# Patient Record
Sex: Female | Born: 1955 | Race: Black or African American | Hispanic: No | Marital: Single | Smoking: Never smoker
Health system: Southern US, Community
[De-identification: ages and names within clinical notes are randomized; demographics above are authoritative.]

## PROBLEM LIST (undated history)

## (undated) DIAGNOSIS — K219 Gastro-esophageal reflux disease without esophagitis: Secondary | ICD-10-CM

## (undated) DIAGNOSIS — I10 Essential (primary) hypertension: Secondary | ICD-10-CM

## (undated) DIAGNOSIS — C801 Malignant (primary) neoplasm, unspecified: Secondary | ICD-10-CM

## (undated) DIAGNOSIS — Z9221 Personal history of antineoplastic chemotherapy: Secondary | ICD-10-CM

## (undated) HISTORY — PX: ESOPHAGOGASTRODUODENOSCOPY ENDOSCOPY: SHX5814

## (undated) HISTORY — PX: COLONOSCOPY: SHX174

## (undated) HISTORY — DX: Gastro-esophageal reflux disease without esophagitis: K21.9

---

## 1997-10-17 ENCOUNTER — Ambulatory Visit (HOSPITAL_COMMUNITY): Admission: RE | Admit: 1997-10-17 | Discharge: 1997-10-17 | Payer: Self-pay | Admitting: Obstetrics and Gynecology

## 1999-11-26 ENCOUNTER — Other Ambulatory Visit: Admission: RE | Admit: 1999-11-26 | Discharge: 1999-11-26 | Payer: Self-pay | Admitting: *Deleted

## 2000-06-22 ENCOUNTER — Other Ambulatory Visit (HOSPITAL_COMMUNITY): Admission: RE | Admit: 2000-06-22 | Discharge: 2000-07-04 | Payer: Self-pay | Admitting: Psychiatry

## 2005-10-25 ENCOUNTER — Encounter: Admission: RE | Admit: 2005-10-25 | Discharge: 2005-10-25 | Payer: Self-pay | Admitting: Internal Medicine

## 2008-04-19 ENCOUNTER — Ambulatory Visit: Payer: Self-pay | Admitting: Gastroenterology

## 2008-05-03 ENCOUNTER — Ambulatory Visit: Payer: Self-pay | Admitting: Gastroenterology

## 2010-08-10 ENCOUNTER — Encounter: Payer: Self-pay | Admitting: Family Medicine

## 2010-08-18 NOTE — Procedures (Signed)
Summary: Colonoscopy   Colonoscopy  Procedure date:  05/03/2008  Findings:      Location:  [facility].    Procedures Next Due Date:    Colonoscopy: 05/2018  Patient Name: Teresa Hays, Teresa Hays MRN:  Procedure Procedures: Colonoscopy CPT: [address].  Personnel: Endoscopist: Rachael Fee, MD.  Referred By: Tracey Harries, MD.  Exam Location: Exam performed in Endoscopy Suite. Outpatient  Patient Consent: Procedure, Alternatives, Risks and Benefits discussed, consent obtained, from patient. Consent was obtained by the RN.  Indications  Average Risk Screening Routine.  History  Current Medications: Patient is not currently taking Coumadin.  Comments: Patient history reviewed and updated, pre-procedure physical performed prior to initiation of sedation? yes Pre-Exam Physical: Performed May 03, 2008. Cardio-pulmonary exam, Abdominal exam, Mental status exam WNL.  Exam Exam: Extent of exam reached: Cecum, extent intended: Cecum.  The cecum was identified by appendiceal orifice and IC valve. Patient position: on left side. Time to Cecum: 00:03:40. Time for Withdrawl: 00:05:52. Colon retroflexion performed. Images taken. ASA Classification: II. Tolerance: good.  Monitoring: Pulse and BP monitoring, Oximetry used. Supplemental O2 given.  Colon Prep Prep results: good.  Sedation Meds: Patient assessed and found to be appropriate for moderate (conscious) sedation. Fentanyl 50 mcg. given IV. Versed 7 mg. given IV.  Findings - NORMAL EXAM: Cecum to Rectum.   Assessment Normal examination.  Comments: NORMAL EXAMINATION.  NO POLYPS, NO CANCERS.  SHE SHOULD CONTINUE TO FOLLOW CURRENT COLORECTAL CANCER SCREENING GUIDELINES WITH A REPEAT COLONOSCOPY IN 10 YEARS. Events  Unplanned Interventions: No intervention was required.  Unplanned Events: There were no complications. Plans Comments: COLONOSCOPY IN 10 YEARS   cc: Tracey Harries, MD  This report was  created from the original endoscopy report, which was reviewed and signed by the above listed endoscopist.

## 2010-08-18 NOTE — Miscellaneous (Signed)
Summary: GI PV  Clinical Lists Changes  Medications: Added new medication of MOVIPREP 100 GM  SOLR (PEG-KCL-NACL-NASULF-NA ASC-C) per prep instructions - Signed Rx of MOVIPREP 100 GM  SOLR (PEG-KCL-NACL-NASULF-NA ASC-C) per prep instructions;  #1 x 0;  Signed;  Entered by: Ezra Sites RN;  Authorized by: Rachael Fee MD;  Method used: Electronically to [facility] [address], [place], [place], [place]  [address], Ph: [phone], Fax: [phone] Observations: Added new observation of NKA: T (04/19/2008 16:20)    Prescriptions: MOVIPREP 100 GM  SOLR (PEG-KCL-NACL-NASULF-NA ASC-C) per prep instructions  #1 x 0   Entered by:   Ezra Sites RN   Authorized by:   Rachael Fee MD   Signed by:   Ezra Sites RN on 04/19/2008   Method used:   Electronically to        [facility] (retail)       [address]       [place], [place]  [address]       Ph: [phone]       Fax: [phone]   RxID:   [id number]

## 2012-11-07 ENCOUNTER — Encounter (HOSPITAL_COMMUNITY): Payer: Self-pay | Admitting: *Deleted

## 2012-11-07 ENCOUNTER — Emergency Department (INDEPENDENT_AMBULATORY_CARE_PROVIDER_SITE_OTHER)
Admission: EM | Admit: 2012-11-07 | Discharge: 2012-11-07 | Disposition: A | Discharge status: Home / Self Care | Payer: Self-pay | Source: Home / Self Care | Attending: Family Medicine | Admitting: Family Medicine

## 2012-11-07 DIAGNOSIS — IMO0002 Reserved for concepts with insufficient information to code with codable children: Secondary | ICD-10-CM

## 2012-11-07 DIAGNOSIS — S73101A Unspecified sprain of right hip, initial encounter: Secondary | ICD-10-CM

## 2012-11-07 HISTORY — DX: Essential (primary) hypertension: I10

## 2012-11-07 MED ORDER — IBUPROFEN 600 MG PO TABS: 600.0000 mg | ORAL_TABLET | Freq: Three times a day (TID) | ORAL | Status: DC | PRN

## 2012-11-07 MED ORDER — CYCLOBENZAPRINE HCL 10 MG PO TABS: 10.0000 mg | ORAL_TABLET | Freq: Two times a day (BID) | ORAL | Status: DC | PRN

## 2012-11-07 MED ORDER — HYDROCODONE-ACETAMINOPHEN 5-325 MG PO TABS: 2.0000 | ORAL_TABLET | Freq: Three times a day (TID) | ORAL | Status: DC | PRN

## 2012-11-07 MED ORDER — TRAMADOL HCL 50 MG PO TABS: 50.0000 mg | ORAL_TABLET | Freq: Three times a day (TID) | ORAL | Status: DC | PRN

## 2012-11-07 NOTE — ED Notes (Signed)
Pt  Ambulated  To  Room  slowley  With  Pain on  Weight  Bearing     She  Reports    2  Days  Ago  She  Twisted   wriong  And  Felt  Something  Pop in r  Hip      She  denys  Falling       She reports  Pain not  releived  By  Motrin

## 2012-11-07 NOTE — ED Provider Notes (Signed)
History     CSN: [id number]  Arrival date & time 11/07/12  1429   First MD Initiated Contact with Patient 11/07/12 1513      Chief Complaint  Patient presents with  . Hip Pain    (Consider location/radiation/quality/duration/timing/severity/associated sxs/prior treatment) HPI Comments: 57 year old female here complaining of right-sided outer hip pain that started 2 days ago after moving a heavy pot from a lower position up to the counter by rotating her hip. Patient reports that she felt pain right after this maneuver. She has taken ibuprofen and heat pads with some improvement. Pain is worse with external rotation of her right hip and with hip flexion. No significant discomfort when standing still on the right lower extremity. Denies fever or chills. No general malaise. No recent illness. No direct injury or recent falls.   Past Medical History  Diagnosis Date  . Hypertension     Past Surgical History  Procedure Laterality Date  . Colonoscopy      No family history on file.  History  Substance Use Topics  . Smoking status: Never Smoker   . Smokeless tobacco: Not on file  . Alcohol Use: No    OB History   Grav Para Term Preterm Abortions TAB SAB Ect Mult Living                  Review of Systems  Constitutional: Negative for fever, chills, appetite change and fatigue.  HENT: Negative for congestion and sore throat.   Gastrointestinal: Negative for nausea, vomiting, abdominal pain and diarrhea.  Genitourinary: Negative for dysuria, frequency, hematuria, flank pain, vaginal discharge, genital sores and pelvic pain.  Musculoskeletal:       As per HPI  Skin: Negative for rash.  Neurological: Negative for headaches.    Allergies  Review of patient's allergies indicates no known allergies.  Home Medications   Current Outpatient Rx  Name  Route  Sig  Dispense  Refill  . atenolol (TENORMIN) 25 MG tablet   Oral   Take 25 mg by mouth daily.         .  hydrochlorothiazide (HYDRODIURIL) 25 MG tablet   Oral   Take 25 mg by mouth daily.         . cyclobenzaprine (FLEXERIL) 10 MG tablet   Oral   Take 1 tablet (10 mg total) by mouth 2 (two) times daily as needed for muscle spasms.   20 tablet   0   . HYDROcodone-acetaminophen (NORCO/VICODIN) 5-325 MG per tablet   Oral   Take 2 tablets by mouth every 8 (eight) hours as needed for pain.   6 tablet   0   . ibuprofen (ADVIL,MOTRIN) 600 MG tablet   Oral   Take 1 tablet (600 mg total) by mouth every 8 (eight) hours as needed for pain.   30 tablet   0   . traMADol (ULTRAM) 50 MG tablet   Oral   Take 1 tablet (50 mg total) by mouth every 8 (eight) hours as needed for pain.   21 tablet   0     BP 167/93  Pulse 60  Temp(Src) 97.9 F (36.6 C) (Oral)  Resp 18  SpO2 99%  Physical Exam  Nursing note and vitals reviewed. Constitutional: She is oriented to person, place, and time. She appears well-developed and well-nourished. No distress.  HENT:  Head: Normocephalic and atraumatic.  Mouth/Throat: No oropharyngeal exudate.  Cardiovascular: Normal heart sounds.   Pulmonary/Chest: Breath sounds normal.  Abdominal:  Soft. There is no tenderness.  Musculoskeletal:  Right Hip: no obvious deformity, no erythema or swelling. Patient is weight bearing with no pain is standing still.  There is tenderness to palpation over median lateral aspect of gluteal area. Fair range of movement despite pain. Pain worse with hip abduction and hip flexion. Otherwise no hip clunks or crepitus.  Entire right lower extremity appears neurovascularly intact.   Lymphadenopathy:    She has no cervical adenopathy.  Neurological: She is alert and oriented to person, place, and time.  Skin: No rash noted. She is not diaphoretic.    ED Course  Procedures (including critical care time)  Labs Reviewed - No data to display No results found.   1. Hip sprain, right, initial encounter       MDM  Impress  sprain/strain of the gluteus medius muscle. Prescribed ibuprofen, tramadol, Flexeril and Norco. Supportive care including rehabilitation exercises and red flags that should prompt her return to medical attention discussed with patient and provided in writing.        Sharin Grave, MD 11/09/12 1050

## 2014-03-28 ENCOUNTER — Encounter: Payer: Self-pay | Admitting: Gastroenterology

## 2016-11-29 DIAGNOSIS — K219 Gastro-esophageal reflux disease without esophagitis: Secondary | ICD-10-CM | POA: Diagnosis not present

## 2016-11-29 DIAGNOSIS — I10 Essential (primary) hypertension: Secondary | ICD-10-CM | POA: Diagnosis not present

## 2016-11-29 DIAGNOSIS — Z6828 Body mass index (BMI) 28.0-28.9, adult: Secondary | ICD-10-CM | POA: Diagnosis not present

## 2016-12-03 DIAGNOSIS — I10 Essential (primary) hypertension: Secondary | ICD-10-CM | POA: Diagnosis not present

## 2016-12-17 DIAGNOSIS — I10 Essential (primary) hypertension: Secondary | ICD-10-CM | POA: Diagnosis not present

## 2016-12-17 DIAGNOSIS — K219 Gastro-esophageal reflux disease without esophagitis: Secondary | ICD-10-CM | POA: Diagnosis not present

## 2016-12-17 DIAGNOSIS — Z1231 Encounter for screening mammogram for malignant neoplasm of breast: Secondary | ICD-10-CM | POA: Diagnosis not present

## 2016-12-30 ENCOUNTER — Other Ambulatory Visit: Payer: Self-pay | Admitting: Family Medicine

## 2016-12-30 DIAGNOSIS — Z1231 Encounter for screening mammogram for malignant neoplasm of breast: Secondary | ICD-10-CM

## 2017-05-23 DIAGNOSIS — N6310 Unspecified lump in the right breast, unspecified quadrant: Secondary | ICD-10-CM | POA: Diagnosis not present

## 2017-05-23 DIAGNOSIS — I10 Essential (primary) hypertension: Secondary | ICD-10-CM | POA: Diagnosis not present

## 2017-05-23 DIAGNOSIS — J209 Acute bronchitis, unspecified: Secondary | ICD-10-CM | POA: Diagnosis not present

## 2017-05-23 DIAGNOSIS — Z124 Encounter for screening for malignant neoplasm of cervix: Secondary | ICD-10-CM | POA: Diagnosis not present

## 2017-05-23 DIAGNOSIS — M25552 Pain in left hip: Secondary | ICD-10-CM | POA: Diagnosis not present

## 2017-09-30 DIAGNOSIS — R509 Fever, unspecified: Secondary | ICD-10-CM | POA: Diagnosis not present

## 2017-09-30 DIAGNOSIS — I10 Essential (primary) hypertension: Secondary | ICD-10-CM | POA: Diagnosis not present

## 2017-09-30 DIAGNOSIS — K219 Gastro-esophageal reflux disease without esophagitis: Secondary | ICD-10-CM | POA: Diagnosis not present

## 2017-09-30 DIAGNOSIS — H6691 Otitis media, unspecified, right ear: Secondary | ICD-10-CM | POA: Diagnosis not present

## 2018-01-06 DIAGNOSIS — R1013 Epigastric pain: Secondary | ICD-10-CM | POA: Diagnosis not present

## 2018-01-06 DIAGNOSIS — K5900 Constipation, unspecified: Secondary | ICD-10-CM | POA: Diagnosis not present

## 2018-01-06 DIAGNOSIS — K219 Gastro-esophageal reflux disease without esophagitis: Secondary | ICD-10-CM | POA: Diagnosis not present

## 2018-01-06 DIAGNOSIS — H6121 Impacted cerumen, right ear: Secondary | ICD-10-CM | POA: Diagnosis not present

## 2018-01-06 DIAGNOSIS — I10 Essential (primary) hypertension: Secondary | ICD-10-CM | POA: Diagnosis not present

## 2018-01-12 DIAGNOSIS — K5904 Chronic idiopathic constipation: Secondary | ICD-10-CM | POA: Diagnosis not present

## 2018-01-12 DIAGNOSIS — R194 Change in bowel habit: Secondary | ICD-10-CM | POA: Diagnosis not present

## 2018-01-12 DIAGNOSIS — Z1211 Encounter for screening for malignant neoplasm of colon: Secondary | ICD-10-CM | POA: Diagnosis not present

## 2018-01-12 DIAGNOSIS — R1310 Dysphagia, unspecified: Secondary | ICD-10-CM | POA: Diagnosis not present

## 2018-01-30 DIAGNOSIS — K219 Gastro-esophageal reflux disease without esophagitis: Secondary | ICD-10-CM | POA: Diagnosis not present

## 2018-01-30 DIAGNOSIS — R1310 Dysphagia, unspecified: Secondary | ICD-10-CM | POA: Diagnosis not present

## 2018-01-30 DIAGNOSIS — K635 Polyp of colon: Secondary | ICD-10-CM | POA: Diagnosis not present

## 2018-01-30 DIAGNOSIS — Z1211 Encounter for screening for malignant neoplasm of colon: Secondary | ICD-10-CM | POA: Diagnosis not present

## 2018-01-30 DIAGNOSIS — K6389 Other specified diseases of intestine: Secondary | ICD-10-CM | POA: Diagnosis not present

## 2018-01-30 DIAGNOSIS — D125 Benign neoplasm of sigmoid colon: Secondary | ICD-10-CM | POA: Diagnosis not present

## 2018-02-14 ENCOUNTER — Other Ambulatory Visit: Payer: Self-pay | Admitting: Gastroenterology

## 2018-02-14 DIAGNOSIS — R112 Nausea with vomiting, unspecified: Secondary | ICD-10-CM

## 2018-02-14 DIAGNOSIS — K219 Gastro-esophageal reflux disease without esophagitis: Secondary | ICD-10-CM | POA: Diagnosis not present

## 2018-02-14 DIAGNOSIS — R110 Nausea: Secondary | ICD-10-CM | POA: Diagnosis not present

## 2018-02-14 DIAGNOSIS — R1011 Right upper quadrant pain: Secondary | ICD-10-CM | POA: Diagnosis not present

## 2018-02-14 DIAGNOSIS — K5904 Chronic idiopathic constipation: Secondary | ICD-10-CM | POA: Diagnosis not present

## 2018-02-28 ENCOUNTER — Encounter (HOSPITAL_COMMUNITY): Payer: Self-pay

## 2018-03-01 ENCOUNTER — Encounter (HOSPITAL_COMMUNITY): Payer: Self-pay

## 2018-03-01 ENCOUNTER — Ambulatory Visit (HOSPITAL_COMMUNITY): Payer: Self-pay

## 2018-03-09 ENCOUNTER — Ambulatory Visit (HOSPITAL_COMMUNITY)
Admission: RE | Admit: 2018-03-09 | Discharge: 2018-03-09 | Disposition: A | Discharge status: Home / Self Care | Payer: 59 | Source: Ambulatory Visit | Attending: Gastroenterology | Admitting: Gastroenterology

## 2018-03-09 ENCOUNTER — Other Ambulatory Visit: Payer: Self-pay | Admitting: Gastroenterology

## 2018-03-09 DIAGNOSIS — R933 Abnormal findings on diagnostic imaging of other parts of digestive tract: Secondary | ICD-10-CM

## 2018-03-09 DIAGNOSIS — R112 Nausea with vomiting, unspecified: Secondary | ICD-10-CM

## 2018-03-09 DIAGNOSIS — R1011 Right upper quadrant pain: Secondary | ICD-10-CM | POA: Diagnosis present

## 2018-03-09 DIAGNOSIS — K769 Liver disease, unspecified: Secondary | ICD-10-CM | POA: Insufficient documentation

## 2018-03-09 DIAGNOSIS — N270 Small kidney, unilateral: Secondary | ICD-10-CM | POA: Diagnosis not present

## 2018-03-09 DIAGNOSIS — K8020 Calculus of gallbladder without cholecystitis without obstruction: Secondary | ICD-10-CM | POA: Insufficient documentation

## 2018-03-14 ENCOUNTER — Ambulatory Visit (HOSPITAL_COMMUNITY): Payer: Self-pay

## 2018-03-15 ENCOUNTER — Encounter (HOSPITAL_COMMUNITY): Payer: Self-pay | Admitting: Radiology

## 2018-03-15 ENCOUNTER — Ambulatory Visit (HOSPITAL_COMMUNITY)
Admission: RE | Admit: 2018-03-15 | Discharge: 2018-03-15 | Disposition: A | Discharge status: Home / Self Care | Payer: 59 | Source: Ambulatory Visit | Attending: Gastroenterology | Admitting: Gastroenterology

## 2018-03-15 DIAGNOSIS — C801 Malignant (primary) neoplasm, unspecified: Secondary | ICD-10-CM | POA: Diagnosis not present

## 2018-03-15 DIAGNOSIS — R933 Abnormal findings on diagnostic imaging of other parts of digestive tract: Secondary | ICD-10-CM | POA: Diagnosis not present

## 2018-03-15 DIAGNOSIS — C787 Secondary malignant neoplasm of liver and intrahepatic bile duct: Secondary | ICD-10-CM | POA: Diagnosis not present

## 2018-03-15 DIAGNOSIS — N6310 Unspecified lump in the right breast, unspecified quadrant: Secondary | ICD-10-CM | POA: Insufficient documentation

## 2018-03-15 DIAGNOSIS — R160 Hepatomegaly, not elsewhere classified: Secondary | ICD-10-CM | POA: Diagnosis present

## 2018-03-15 MED ORDER — IOPAMIDOL (ISOVUE-300) INJECTION 61%
100.0000 mL | Freq: Once | INTRAVENOUS | Status: AC | PRN
  Administered 2018-03-15: 100 mL via INTRAVENOUS

## 2018-03-15 MED ORDER — IOPAMIDOL (ISOVUE-300) INJECTION 61%
INTRAVENOUS | Status: AC
  Filled 2018-03-15: qty 100

## 2018-03-16 ENCOUNTER — Encounter: Payer: Self-pay | Admitting: Hematology

## 2018-03-16 ENCOUNTER — Inpatient Hospital Stay: Payer: 59

## 2018-03-16 ENCOUNTER — Telehealth: Payer: Self-pay | Admitting: Genetics

## 2018-03-16 ENCOUNTER — Inpatient Hospital Stay: Payer: 59 | Attending: Hematology | Admitting: Hematology

## 2018-03-16 ENCOUNTER — Telehealth: Payer: Self-pay | Admitting: Hematology

## 2018-03-16 ENCOUNTER — Other Ambulatory Visit: Payer: Self-pay | Admitting: Hematology

## 2018-03-16 DIAGNOSIS — Z79899 Other long term (current) drug therapy: Secondary | ICD-10-CM | POA: Diagnosis not present

## 2018-03-16 DIAGNOSIS — K219 Gastro-esophageal reflux disease without esophagitis: Secondary | ICD-10-CM | POA: Insufficient documentation

## 2018-03-16 DIAGNOSIS — K5900 Constipation, unspecified: Secondary | ICD-10-CM | POA: Insufficient documentation

## 2018-03-16 DIAGNOSIS — M545 Low back pain: Secondary | ICD-10-CM | POA: Diagnosis not present

## 2018-03-16 DIAGNOSIS — R109 Unspecified abdominal pain: Secondary | ICD-10-CM | POA: Diagnosis not present

## 2018-03-16 DIAGNOSIS — R160 Hepatomegaly, not elsewhere classified: Secondary | ICD-10-CM | POA: Insufficient documentation

## 2018-03-16 DIAGNOSIS — R634 Abnormal weight loss: Secondary | ICD-10-CM | POA: Diagnosis not present

## 2018-03-16 DIAGNOSIS — I10 Essential (primary) hypertension: Secondary | ICD-10-CM | POA: Diagnosis not present

## 2018-03-16 DIAGNOSIS — C787 Secondary malignant neoplasm of liver and intrahepatic bile duct: Secondary | ICD-10-CM

## 2018-03-16 DIAGNOSIS — N6310 Unspecified lump in the right breast, unspecified quadrant: Secondary | ICD-10-CM | POA: Diagnosis not present

## 2018-03-16 DIAGNOSIS — Z1231 Encounter for screening mammogram for malignant neoplasm of breast: Secondary | ICD-10-CM

## 2018-03-16 LAB — CBC WITH DIFFERENTIAL (CANCER CENTER ONLY)
Basophils Absolute: 0 10*3/uL (ref 0.0–0.1)
Basophils Relative: 1 %
Eosinophils Absolute: 0.2 10*3/uL (ref 0.0–0.5)
Eosinophils Relative: 3 %
HCT: 37.2 % (ref 34.8–46.6)
Hemoglobin: 12.6 g/dL (ref 11.6–15.9)
Lymphocytes Relative: 30 %
Lymphs Abs: 2.3 10*3/uL (ref 0.9–3.3)
MCH: 28.1 pg (ref 25.1–34.0)
MCHC: 33.9 g/dL (ref 31.5–36.0)
MCV: 82.9 fL (ref 79.5–101.0)
Monocytes Absolute: 0.6 10*3/uL (ref 0.1–0.9)
Monocytes Relative: 8 %
Neutro Abs: 4.6 10*3/uL (ref 1.5–6.5)
Neutrophils Relative %: 58 %
Platelet Count: 303 10*3/uL (ref 145–400)
RBC: 4.49 MIL/uL (ref 3.70–5.45)
RDW: 14.2 % (ref 11.2–14.5)
WBC Count: 7.7 10*3/uL (ref 3.9–10.3)

## 2018-03-16 LAB — CMP (CANCER CENTER ONLY)
ALT: 150 U/L — ABNORMAL HIGH (ref 0–44)
AST: 190 U/L (ref 15–41)
Albumin: 4.2 g/dL (ref 3.5–5.0)
Alkaline Phosphatase: 230 U/L — ABNORMAL HIGH (ref 38–126)
Anion gap: 14 (ref 5–15)
BUN: 21 mg/dL (ref 8–23)
CO2: 27 mmol/L (ref 22–32)
Calcium: 10.3 mg/dL (ref 8.9–10.3)
Chloride: 95 mmol/L — ABNORMAL LOW (ref 98–111)
Creatinine: 1.2 mg/dL — ABNORMAL HIGH (ref 0.44–1.00)
GFR, Est AFR Am: 55 mL/min — ABNORMAL LOW (ref >60)
GFR, Estimated: 48 mL/min — ABNORMAL LOW (ref >60)
Glucose, Bld: 108 mg/dL — ABNORMAL HIGH (ref 70–99)
Potassium: 4 mmol/L (ref 3.5–5.1)
Sodium: 136 mmol/L (ref 135–145)
Total Bilirubin: 0.6 mg/dL (ref 0.3–1.2)
Total Protein: 8 g/dL (ref 6.5–8.1)

## 2018-03-16 LAB — APTT: aPTT: 29 seconds (ref 24–36)

## 2018-03-16 LAB — PROTIME-INR
INR: 0.97
Prothrombin Time: 12.8 seconds (ref 11.4–15.2)

## 2018-03-16 MED ORDER — ONDANSETRON HCL 8 MG PO TABS: 8.0000 mg | ORAL_TABLET | Freq: Three times a day (TID) | ORAL | 0 refills | Status: DC | PRN

## 2018-03-16 NOTE — Telephone Encounter (Deleted)
A genetic counseling appt has been scheduled for the pt to see Ferol Luz on 9/30 at 2pm. Unable to reach the pt. Letter mailed.

## 2018-03-16 NOTE — Telephone Encounter (Signed)
Received a staff msg from Dr. Burr Medico to schedule the pt an urgent appt. Pt has been cld and scheduled to see Dr. Burr Medico today at 230pm. Pt agreed to the appt date and time. Aware to arrive 30 minutes early.   I contacted Dr. Lorie Apley office to obtain the pt's records.

## 2018-03-16 NOTE — Progress Notes (Signed)
[facility]  [phone] [phone]  Clinic New Consult Note   Patient Care Team: Patient, No Pcp Per as PCP - General (General Practice) 03/16/2018  Referring Physician: Dr. Collene Mares  CHIEF COMPLAINTS/PURPOSE OF CONSULTATION:  Newly found suspicious liver and breast lesions  HISTORY OF PRESENTING ILLNESS:  Teresa Hays 62 y.o. female who is here with her family member. She works at [employer]. She went to see Dr. Collene Mares because she felt that there is something stuck in her throat with burning sensation in her abdomen. She then started vomiting bile. She reports losing weight and has no appetite. She finds it hard to eat as she feels nauseated after eating, she can still drink water. She can drinks smoothies and shakes. She takes omeprazole that helps. She also reports constipation and dry mouth. Her skin is also dry. She denies HA, CP, or skin rashes.   She also reports very bad lower back pain that doesn't allow her to sit on her couch after work. The pain is 7/10, and she doesn't use any pain medications.    G1P1 Age 84 with first child. 1 Child Age 66 at menarche   She has GERD and HTN. No alcohol or smoking. Her sister had breast cancer at age 6.   MEDICAL HISTORY:  Past Medical History:  Diagnosis Date  . GERD (gastroesophageal reflux disease)   . Hypertension     SURGICAL HISTORY: Past Surgical History:  Procedure Laterality Date  . COLONOSCOPY      SOCIAL HISTORY: Social History   Socioeconomic History  . Marital status: Single    Spouse name: Not on file  . Number of children: Not on file  . Years of education: Not on file  . Highest education level: Not on file  Occupational History  . Not on file  Social Needs  . Financial resource strain: Not on file  . Food insecurity:    Worry: Not on file    Inability: Not on file  . Transportation needs:    Medical: Not on file    Non-medical: Not on file  Tobacco Use    . Smoking status: Never Smoker  . Smokeless tobacco: Never Used  Substance and Sexual Activity  . Alcohol use: No  . Drug use: Not on file  . Sexual activity: Not on file  Lifestyle  . Physical activity:    Days per week: Not on file    Minutes per session: Not on file  . Stress: Not on file  Relationships  . Social connections:    Talks on phone: Not on file    Gets together: Not on file    Attends religious service: Not on file    Active member of club or organization: Not on file    Attends meetings of clubs or organizations: Not on file    Relationship status: Not on file  . Intimate partner violence:    Fear of current or ex partner: Not on file    Emotionally abused: Not on file    Physically abused: Not on file    Forced sexual activity: Not on file  Other Topics Concern  . Not on file  Social History Narrative  . Not on file    FAMILY HISTORY: Family History  Problem Relation Age of Onset  . Diabetes Mother   . Cancer Sister 35       breast cancer  . Rheum arthritis Sister     ALLERGIES:  has  No Known Allergies.  MEDICATIONS:  Current Outpatient Medications  Medication Sig Dispense Refill  . amLODipine (NORVASC) 5 MG tablet amlodipine 5 mg tablet  TAKE 1 TABLET BY MOUTH ONCE DAILY    . atenolol (TENORMIN) 50 MG tablet atenolol 50 mg tablet  TAKE 1 TABLET BY MOUTH ONCE DAILY    . hydrochlorothiazide (HYDRODIURIL) 25 MG tablet Take 25 mg by mouth daily.    Marland Kitchen linaclotide (LINZESS) 72 MCG capsule Take 72 mcg by mouth as directed. Take 1 capsule   2 - 3 times per week    . ranitidine (ZANTAC) 150 MG tablet ranitidine 150 mg tablet  Take 1 tablet every day by oral route at bedtime.    . ondansetron (ZOFRAN) 8 MG tablet Take 1 tablet (8 mg total) by mouth every 8 (eight) hours as needed for nausea or vomiting. 30 tablet 0   No current facility-administered medications for this visit.     REVIEW OF SYSTEMS:   Constitutional: Denies fevers, chills or  abnormal night sweats (+) weight loss Eyes: Denies blurriness of vision, double vision or watery eyes Ears, nose, mouth, throat, and face: Denies mucositis or sore throat Respiratory: Denies cough, dyspnea or wheezes Cardiovascular: Denies palpitation, chest discomfort or lower extremity swelling Gastrointestinal:  Denies nausea, heartburn or change in bowel habits (+) GERD (+) dysphagia (+) bilious vomiting (+) constipation  Skin: Denies abnormal skin rashes (+) dry skin Lymphatics: Denies new lymphadenopathy or easy bruising Neurological:Denies numbness, tingling or new weaknesses Behavioral/Psych: Mood is stable, no new changes  All other systems were reviewed with the patient and are negative.  PHYSICAL EXAMINATION:  ECOG PERFORMANCE STATUS: 2 - Symptomatic, <50% confined to bed  Vitals:   03/16/18 1440  BP: 132/89  Pulse: 76  Resp: 18  Temp: 98.6 F (37 C)  SpO2: 98%   Filed Weights   03/16/18 1440  Weight: 145 lb 8 oz (66 kg)    GENERAL:alert, no distress and comfortable SKIN: skin color, texture, turgor are normal, no rashes or significant lesions EYES: normal, conjunctiva are pink and non-injected, sclera clear OROPHARYNX:no exudate, no erythema and lips, buccal mucosa, and tongue normal  NECK: supple, thyroid normal size, non-tender, without nodularity LYMPH:  no palpable lymphadenopathy in the cervical, axillary or inguinal LUNGS: clear to auscultation and percussion with normal breathing effort HEART: regular rate & rhythm and no murmurs and no lower extremity edema ABDOMEN:abdomen soft, non-tender and normal bowel sounds (+) liver edge palpable 2-3 cm below costal margin Musculoskeletal:no cyanosis of digits and no clubbing  PSYCH: alert & oriented x 3 with fluent speech NEURO: no focal motor/sensory deficits BREAST: (+) 2.5 cm right breast mass at the 9 O Clock position    LABORATORY DATA:  I have reviewed the data as listed No flowsheet data found.  No  flowsheet data found.  Procedures  01/30/2018 EDG     01/30/2018 Colonoscopy       05/03/2008 Colonoscopy Assessment Normal examination.  Comments: NORMAL EXAMINATION.  NO POLYPS, NO CANCERS.  PATHOLOGY  01/30/2018 Surgical Pathology     RADIOGRAPHIC STUDIES: I have personally reviewed the radiological images as listed and agreed with the findings in the report.  03/15/2018 CT AP IMPRESSION: 1. Widespread hepatic metastasis. 2. 2.6 cm lateral right breast soft tissue nodule could represent a breast primary or an incidental benign lesion. Consider correlation with mammogram and ultrasound. 3. No definite source of primary malignancy identified within the abdomen or pelvis. There is possible rectosigmoid junction wall  thickening. Consider colonoscopy with attention to this area. 4. Distal esophageal wall thickening, suggesting esophagitis.  03/09/2018 US Abdomen IMPRESSION: 1. Mass lesions throughout the liver, consistent with metastatic disease. Liver as a somewhat nodular contour suggesting underlying hepatic cirrhosis. Inhomogeneous echotexture to the liver.  2. Cholelithiasis with mild gallbladder wall thickening. A degree of cholecystitis cannot be excluded by ultrasound.  3. Portions of pancreas obscured by gas. Visualized portions of pancreas appear normal.  4. Small right kidney. Etiology uncertain. This finding potentially may be indicative of renal artery stenosis. In this regard, question whether patient is hypertensive.   US Abdomen Complete  Result Date: 03/09/2018 CLINICAL DATA:  Upper abdominal pain with nausea and vomiting. Weight loss. EXAM: ABDOMEN ULTRASOUND COMPLETE COMPARISON:  None. FINDINGS: Gallbladder: Within the gallbladder, there are echogenic foci which move and shadow consistent with cholelithiasis. Largest gallstone measures 1.9 cm in length. Gallbladder is mildly thickened without gallbladder edema or pericholecystic fluid. No  sonographic Murphy sign noted by sonographer. Common bile duct: Diameter: 3 mm. No intrahepatic, common hepatic, or common bile duct dilatation. Liver: There are mass lesions throughout the liver involving all lobes in segments. Several of these mass lesions appear infiltrative; others appear more discrete. Largest individual mass is seen on the right measuring 3.6 x 2.1 x 3.7 cm. Note that the liver contour is somewhat lobular. Liver echogenicity is inhomogeneous. Portal vein is patent on color Doppler imaging with normal direction of blood flow towards the liver. IVC: No abnormality visualized. Pancreas: Visualized portion unremarkable. Portions of pancreas obscured by gas. Spleen: Size and appearance within normal limits. Right Kidney: Length: 8.5 cm. Echogenicity within normal limits. No mass or hydronephrosis visualized. Left Kidney: Length: 10.1 cm. Echogenicity within normal limits. No mass or hydronephrosis visualized. Abdominal aorta: No aneurysm visualized. Other findings: No demonstrable ascites. IMPRESSION: 1. Mass lesions throughout the liver, consistent with metastatic disease. Liver as a somewhat nodular contour suggesting underlying hepatic cirrhosis. Inhomogeneous echotexture to the liver. 2. Cholelithiasis with mild gallbladder wall thickening. A degree of cholecystitis cannot be excluded by ultrasound. 3. Portions of pancreas obscured by gas. Visualized portions of pancreas appear normal. 4. Small right kidney. Etiology uncertain. This finding potentially may be indicative of renal artery stenosis. In this regard, question whether patient is hypertensive. These results will be called to the ordering clinician or representative by the Radiologist Assistant, and communication documented in the PACS or zVision Dashboard. Electronically Signed   By: Lowella Grip III M.D.   On: 03/09/2018 11:31   Ct Abdomen Pelvis W Contrast  Result Date: 03/15/2018 CLINICAL DATA:  Epigastric pain with nausea  for several months. Weight loss. Liver mass on ultrasound. EXAM: CT ABDOMEN AND PELVIS WITH CONTRAST TECHNIQUE: Multidetector CT imaging of the abdomen and pelvis was performed using the standard protocol following bolus administration of intravenous contrast. CONTRAST:  166m ISOVUE-300 IOPAMIDOL (ISOVUE-300) INJECTION 61% COMPARISON:  03/09/2018 abdominal ultrasound. FINDINGS: Lower chest: Clear lung bases. Normal heart size without pericardial or pleural effusion. Distal esophageal wall thickening is mild on image 1/3. Relatively well-circumscribed lateral right breast 2.6 cm soft tissue density lesion on image 2/3. Hepatobiliary: Multiple bilateral liver masses, consistent with metastatic disease. Index right hepatic lobe lesion measures 3.3 x 3.2 cm on image 20/3. Lateral segment left liver lobe 5.9 x 4.6 cm lesion image 12/3. Normal gallbladder, without biliary ductal dilatation. Pancreas: Normal, without mass or ductal dilatation. Spleen: Normal in size, without focal abnormality. Adrenals/Urinary Tract: Minimal right adrenal nodularity. Normal left adrenal gland.  Normal kidneys, without hydronephrosis. Normal urinary bladder. Stomach/Bowel: Normal stomach, without wall thickening. The distal sigmoid is underdistended. Possible wall thickening at the rectosigmoid junction on 66/3. Normal terminal ileum and appendix. Normal small bowel. Vascular/Lymphatic: Normal caliber of the aorta and branch vessels. No abdominopelvic adenopathy. Reproductive: Retroverted uterus.  No adnexal mass. Other: No significant free fluid. No evidence of omental or peritoneal disease. Musculoskeletal: Disc bulges at L4-5 and L5-S1. IMPRESSION: 1. Widespread hepatic metastasis. 2. 2.6 cm lateral right breast soft tissue nodule could represent a breast primary or an incidental benign lesion. Consider correlation with mammogram and ultrasound. 3. No definite source of primary malignancy identified within the abdomen or pelvis. There is  possible rectosigmoid junction wall thickening. Consider colonoscopy with attention to this area. 4. Distal esophageal wall thickening, suggesting esophagitis. Electronically Signed   By: Abigail Miyamoto M.D.   On: 03/15/2018 16:00    ASSESSMENT & PLAN:  Adamarys Shall is a 62 y.o. female with history of HTN, GERD  1. Right breast mass with multiple lver masses suspicious of metastasis.  -Patient has never had screening mammogram.  She presented with fatigue, anorexia, upper abdominal and back pain and weight loss.  Exam showed a palpable right breast mass, and hepatomegaly with tenderness.   -I have reviewed her recent CT abdomen scan images with patient and her niece in person.  The CT scan is highly suspicious for metastatic malignancy to liver, with probable breast primary.   -I recommend a CT chest without contrast and bone scan to complete staging. -Recommend bilateral diagnostic mammogram and ultrasound for further evaluation, and biopsy of the right breast mass -I recommend a liver biopsy by interventional radiology  -Obtain lab CBC, CMP, PT and APTT today -We briefly discussed the treatment options for metastatic breast cancer, it depends on hormonal receptor and HER-2 status.  -I will discuss diagnosis treatment options when the results are back -f/u in 2 weeks -Labs today   2. Upper abdominal and back pain -She has tried Tylenol and ibuprofen, did not help her much -I will prescribe her tramadol 50 mg every 6 hours as needed for pain.  Constipation and management reviewed with her   Plan: -I will order liver and breast biopsy  -Bilateral diagnostic mammogram and ultrasound  -bone scan and CT Chest  -I will discuss treatment options when the results are back -f/u in 2 weeks -Labs today  No orders of the defined types were placed in this encounter.   All questions were answered. The patient knows to call the clinic with any problems, questions or concerns. I spent 50  minutes counseling the patient face to face. The total time spent in the appointment was 60 minutes and more than 50% was on counseling.  Dierdre Searles Dweik am acting as scribe for Dr. [name].  I have reviewed the above documentation for accuracy and completeness, and I agree with the above.      [name], MD 03/16/2018 3:36 PM

## 2018-03-16 NOTE — Telephone Encounter (Signed)
This encounter was created in error

## 2018-03-17 ENCOUNTER — Other Ambulatory Visit: Payer: Self-pay | Admitting: Hematology

## 2018-03-17 ENCOUNTER — Encounter: Payer: Self-pay | Admitting: Hematology

## 2018-03-17 DIAGNOSIS — N6310 Unspecified lump in the right breast, unspecified quadrant: Secondary | ICD-10-CM

## 2018-03-17 MED ORDER — TRAMADOL HCL 50 MG PO TABS: 50.0000 mg | ORAL_TABLET | Freq: Four times a day (QID) | ORAL | 1 refills | Status: DC | PRN

## 2018-03-17 MED ORDER — MIRTAZAPINE 7.5 MG PO TABS: 7.5000 mg | ORAL_TABLET | Freq: Every day | ORAL | 0 refills | Status: DC

## 2018-03-17 NOTE — Addendum Note (Signed)
Addended by: [name] on: 03/17/2018 12:14 PM   Modules accepted: Orders

## 2018-03-21 ENCOUNTER — Ambulatory Visit (HOSPITAL_COMMUNITY)
Admission: RE | Admit: 2018-03-21 | Discharge: 2018-03-21 | Disposition: A | Discharge status: Home / Self Care | Payer: 59 | Source: Ambulatory Visit | Attending: Hematology | Admitting: Hematology

## 2018-03-21 ENCOUNTER — Other Ambulatory Visit: Payer: 59

## 2018-03-21 DIAGNOSIS — I700 Atherosclerosis of aorta: Secondary | ICD-10-CM | POA: Insufficient documentation

## 2018-03-21 DIAGNOSIS — N6310 Unspecified lump in the right breast, unspecified quadrant: Secondary | ICD-10-CM | POA: Diagnosis not present

## 2018-03-21 DIAGNOSIS — C787 Secondary malignant neoplasm of liver and intrahepatic bile duct: Secondary | ICD-10-CM | POA: Diagnosis not present

## 2018-03-21 DIAGNOSIS — C801 Malignant (primary) neoplasm, unspecified: Secondary | ICD-10-CM | POA: Diagnosis not present

## 2018-03-22 ENCOUNTER — Ambulatory Visit
Admission: RE | Admit: 2018-03-22 | Discharge: 2018-03-22 | Disposition: A | Discharge status: Home / Self Care | Payer: 59 | Source: Ambulatory Visit | Attending: Hematology | Admitting: Hematology

## 2018-03-22 ENCOUNTER — Other Ambulatory Visit: Payer: Self-pay | Admitting: Hematology

## 2018-03-22 ENCOUNTER — Other Ambulatory Visit: Payer: Self-pay

## 2018-03-22 DIAGNOSIS — N6310 Unspecified lump in the right breast, unspecified quadrant: Secondary | ICD-10-CM

## 2018-03-22 DIAGNOSIS — R599 Enlarged lymph nodes, unspecified: Secondary | ICD-10-CM

## 2018-03-22 DIAGNOSIS — N6311 Unspecified lump in the right breast, upper outer quadrant: Secondary | ICD-10-CM | POA: Diagnosis not present

## 2018-03-22 DIAGNOSIS — N6324 Unspecified lump in the left breast, lower inner quadrant: Secondary | ICD-10-CM | POA: Diagnosis not present

## 2018-03-22 DIAGNOSIS — N6320 Unspecified lump in the left breast, unspecified quadrant: Secondary | ICD-10-CM

## 2018-03-22 DIAGNOSIS — N6322 Unspecified lump in the left breast, upper inner quadrant: Secondary | ICD-10-CM | POA: Diagnosis not present

## 2018-03-22 DIAGNOSIS — R928 Other abnormal and inconclusive findings on diagnostic imaging of breast: Secondary | ICD-10-CM | POA: Diagnosis not present

## 2018-03-23 ENCOUNTER — Other Ambulatory Visit: Payer: Self-pay | Admitting: Radiology

## 2018-03-23 ENCOUNTER — Telehealth: Payer: Self-pay

## 2018-03-23 NOTE — Telephone Encounter (Signed)
Faxed signed order for biopsy to [facility].

## 2018-03-24 ENCOUNTER — Other Ambulatory Visit: Payer: Self-pay | Admitting: Radiology

## 2018-03-24 ENCOUNTER — Telehealth: Payer: Self-pay | Admitting: Hematology

## 2018-03-24 ENCOUNTER — Ambulatory Visit
Admission: RE | Admit: 2018-03-24 | Discharge: 2018-03-24 | Disposition: A | Discharge status: Home / Self Care | Payer: 59 | Source: Ambulatory Visit | Attending: Hematology | Admitting: Hematology

## 2018-03-24 ENCOUNTER — Other Ambulatory Visit: Payer: Self-pay | Admitting: Hematology

## 2018-03-24 DIAGNOSIS — C50811 Malignant neoplasm of overlapping sites of right female breast: Secondary | ICD-10-CM | POA: Diagnosis not present

## 2018-03-24 DIAGNOSIS — R590 Localized enlarged lymph nodes: Secondary | ICD-10-CM | POA: Diagnosis not present

## 2018-03-24 DIAGNOSIS — N6310 Unspecified lump in the right breast, unspecified quadrant: Secondary | ICD-10-CM

## 2018-03-24 DIAGNOSIS — N6313 Unspecified lump in the right breast, lower outer quadrant: Secondary | ICD-10-CM | POA: Diagnosis not present

## 2018-03-24 DIAGNOSIS — N6311 Unspecified lump in the right breast, upper outer quadrant: Secondary | ICD-10-CM | POA: Diagnosis not present

## 2018-03-24 DIAGNOSIS — C773 Secondary and unspecified malignant neoplasm of axilla and upper limb lymph nodes: Secondary | ICD-10-CM | POA: Diagnosis not present

## 2018-03-24 DIAGNOSIS — C50411 Malignant neoplasm of upper-outer quadrant of right female breast: Secondary | ICD-10-CM | POA: Diagnosis not present

## 2018-03-24 DIAGNOSIS — R599 Enlarged lymph nodes, unspecified: Secondary | ICD-10-CM

## 2018-03-24 HISTORY — PX: BREAST BIOPSY: SHX20

## 2018-03-24 NOTE — Telephone Encounter (Signed)
I called pt back and discussed her CT chest results, she appreciated the call. I will see her next week after more tests.   [name]  03/24/2018

## 2018-03-27 ENCOUNTER — Ambulatory Visit (HOSPITAL_COMMUNITY)
Admission: RE | Admit: 2018-03-27 | Discharge: 2018-03-27 | Disposition: A | Discharge status: Home / Self Care | Payer: 59 | Source: Ambulatory Visit | Attending: Hematology | Admitting: Hematology

## 2018-03-27 ENCOUNTER — Encounter (HOSPITAL_COMMUNITY): Payer: Self-pay

## 2018-03-27 DIAGNOSIS — C787 Secondary malignant neoplasm of liver and intrahepatic bile duct: Secondary | ICD-10-CM | POA: Insufficient documentation

## 2018-03-27 DIAGNOSIS — C50919 Malignant neoplasm of unspecified site of unspecified female breast: Secondary | ICD-10-CM | POA: Insufficient documentation

## 2018-03-27 DIAGNOSIS — R160 Hepatomegaly, not elsewhere classified: Secondary | ICD-10-CM

## 2018-03-27 DIAGNOSIS — K7689 Other specified diseases of liver: Secondary | ICD-10-CM | POA: Diagnosis not present

## 2018-03-27 LAB — CBC
HCT: 40.5 % (ref 36.0–46.0)
Hemoglobin: 13 g/dL (ref 12.0–15.0)
MCH: 28 pg (ref 26.0–34.0)
MCHC: 32.1 g/dL (ref 30.0–36.0)
MCV: 87.1 fL (ref 78.0–100.0)
Platelets: 275 10*3/uL (ref 150–400)
RBC: 4.65 MIL/uL (ref 3.87–5.11)
RDW: 15.2 % (ref 11.5–15.5)
WBC: 7.7 10*3/uL (ref 4.0–10.5)

## 2018-03-27 LAB — POCT I-STAT CREATININE: Creatinine, Ser: 1.1 mg/dL — ABNORMAL HIGH (ref 0.44–1.00)

## 2018-03-27 LAB — PROTIME-INR
INR: 1.02
Prothrombin Time: 13.3 seconds (ref 11.4–15.2)

## 2018-03-27 MED ORDER — HYDROCODONE-ACETAMINOPHEN 5-325 MG PO TABS: 1.0000 | ORAL_TABLET | ORAL | Status: DC | PRN

## 2018-03-27 MED ORDER — MIDAZOLAM HCL 2 MG/2ML IJ SOLN
INTRAMUSCULAR | Status: AC | PRN
  Administered 2018-03-27: 1 mg via INTRAVENOUS
  Administered 2018-03-27: 0.5 mg via INTRAVENOUS

## 2018-03-27 MED ORDER — SODIUM CHLORIDE 0.9 % IV SOLN: INTRAVENOUS | Status: DC

## 2018-03-27 MED ORDER — FENTANYL CITRATE (PF) 100 MCG/2ML IJ SOLN
INTRAMUSCULAR | Status: AC | PRN
  Administered 2018-03-27: 50 ug via INTRAVENOUS
  Administered 2018-03-27: 25 ug via INTRAVENOUS

## 2018-03-27 MED ORDER — MIDAZOLAM HCL 2 MG/2ML IJ SOLN
INTRAMUSCULAR | Status: AC
  Filled 2018-03-27: qty 2

## 2018-03-27 MED ORDER — LIDOCAINE HCL (PF) 1 % IJ SOLN
INTRAMUSCULAR | Status: AC
  Filled 2018-03-27: qty 30

## 2018-03-27 MED ORDER — FENTANYL CITRATE (PF) 100 MCG/2ML IJ SOLN
INTRAMUSCULAR | Status: AC
  Filled 2018-03-27: qty 2

## 2018-03-27 NOTE — H&P (Signed)
Chief Complaint: Patient was seen in consultation today for liver lesion biopsy at the request of Feng,Yan  Referring Physician(s): Feng,Yan  Supervising Physician: Sandi Mariscal  Patient Status: [facility] - Out-pt  History of Present Illness: Teresa Hays is a 62 y.o. female   abd pain; wt loss Dysphagia Was evaluated with Dr Collene Mares Work up included imaging CT 8/28:  IMPRESSION: 1. Widespread hepatic metastasis. 2. 2.6 cm lateral right breast soft tissue nodule could represent a breast primary or an incidental benign lesion. Consider correlation with mammogram and ultrasound. 3. No definite source of primary malignancy identified within the abdomen or pelvis. There is possible rectosigmoid junction wall thickening. Consider colonoscopy with attention to this area. 4. Distal esophageal wall thickening, suggesting esophagitis.  Referred to Dr Burr Medico 8/29: 1. Right breast mass with multiple lver masses suspicious of metastasis.  -Patient has never had screening mammogram.  She presented with fatigue, anorexia, upper abdominal and back pain and weight loss.  Exam showed a palpable right breast mass, and hepatomegaly with tenderness.   -I have reviewed her recent CT abdomen scan images with patient and her niece in person.  The CT scan is highly suspicious for metastatic malignancy to liver, with probable breast primary.   -I recommend a CT chest without contrast and bone scan to complete staging. -Recommend bilateral diagnostic mammogram and ultrasound for further evaluation, and biopsy of the right breast mass -I recommend a liver biopsy by interventional radiology  -Obtain lab CBC, CMP, PT and APTT today -We briefly discussed the treatment options for metastatic breast cancer, it depends on hormonal receptor and HER-2 status.  -I will discuss diagnosis treatment options when the results are back -f/u in 2 weeks -Labs today   Right Breast Bx 9/6: pending Scheduled today for  liver lesion biopsy   Past Medical History:  Diagnosis Date  . GERD (gastroesophageal reflux disease)   . Hypertension     Past Surgical History:  Procedure Laterality Date  . COLONOSCOPY      Allergies: Patient has no known allergies.  Medications: Prior to Admission medications   Medication Sig Start Date End Date Taking? Authorizing Provider  amLODipine (NORVASC) 5 MG tablet Take 5 mg by mouth daily.    Yes [provider]  atenolol (TENORMIN) 50 MG tablet Take 50 mg by mouth daily.    Yes [provider]  hydrochlorothiazide (HYDRODIURIL) 25 MG tablet Take 25 mg by mouth daily.   Yes [provider]  linaclotide (LINZESS) 72 MCG capsule Take 72 mcg by mouth daily as needed (IBS).    Yes [provider]  ondansetron (ZOFRAN) 8 MG tablet Take 1 tablet (8 mg total) by mouth every 8 (eight) hours as needed for nausea or vomiting. 03/16/18  Yes [name], MD  ranitidine (ZANTAC) 150 MG tablet Take 150 mg by mouth 2 (two) times daily.    Yes [provider]  traMADol (ULTRAM) 50 MG tablet Take 1 tablet (50 mg total) by mouth every 6 (six) hours as needed. 03/17/18  Yes [name], MD  mirtazapine (REMERON) 7.5 MG tablet Take 1 tablet (7.5 mg total) by mouth at bedtime. 03/17/18   [name], MD     Family History  Problem Relation Age of Onset  . Diabetes Mother   . Cancer Sister 43       breast cancer  . Breast cancer Sister   . Rheum arthritis Sister     Social History   Socioeconomic History  .  Marital status: Single    Spouse name: Not on file  . Number of children: Not on file  . Years of education: Not on file  . Highest education level: Not on file  Occupational History  . Not on file  Social Needs  . Financial resource strain: Not on file  . Food insecurity:    Worry: Not on file    Inability: Not on file  . Transportation needs:    Medical: Not on file    Non-medical: Not on file  Tobacco Use  . Smoking status:  Never Smoker  . Smokeless tobacco: Never Used  Substance and Sexual Activity  . Alcohol use: No  . Drug use: Not on file  . Sexual activity: Not on file  Lifestyle  . Physical activity:    Days per week: Not on file    Minutes per session: Not on file  . Stress: Not on file  Relationships  . Social connections:    Talks on phone: Not on file    Gets together: Not on file    Attends religious service: Not on file    Active member of club or organization: Not on file    Attends meetings of clubs or organizations: Not on file    Relationship status: Not on file  Other Topics Concern  . Not on file  Social History Narrative  . Not on file     Review of Systems: A 12 point ROS discussed and pertinent positives are indicated in the HPI above.  All other systems are negative.  Review of Systems  Constitutional: Positive for activity change, appetite change, fatigue and unexpected weight change.  HENT: Positive for trouble swallowing.   Respiratory: Negative for cough.   Cardiovascular: Negative for chest pain.  Gastrointestinal: Positive for abdominal pain and nausea.  Musculoskeletal: Negative for back pain and gait problem.  Neurological: Negative for weakness.  Psychiatric/Behavioral: Negative for behavioral problems and confusion.    Vital Signs: BP 128/85   Pulse 74   Temp 98.4 F (36.9 C)   Resp 20   Ht _0  (1.626 m)   Wt 146 lb (66.2 kg)   SpO2 100%   BMI 25.06 kg/m   Physical Exam  Constitutional: She is oriented to person, place, and time.  Cardiovascular: Normal rate, regular rhythm and normal heart sounds.  Pulmonary/Chest: Effort normal and breath sounds normal.  Abdominal: Soft. Bowel sounds are normal. There is tenderness.  Musculoskeletal: Normal range of motion.  Neurological: She is alert and oriented to person, place, and time.  Skin: Skin is warm and dry.  Psychiatric: She has a normal mood and affect. Her behavior is normal. Judgment and  thought content normal.  Vitals reviewed.   Imaging: Ct Chest Wo Contrast  Result Date: 03/21/2018 CLINICAL DATA:  Liver metastasis. Evaluate for metastatic disease within the chest. EXAM: CT CHEST WITHOUT CONTRAST TECHNIQUE: Multidetector CT imaging of the chest was performed following the standard protocol without IV contrast. COMPARISON:  03/15/2018 FINDINGS: Cardiovascular: The heart size is normal. Mild aortic atherosclerosis. Mediastinum/Nodes: Normal appearance of the thyroid gland. The trachea appears patent and is midline. Normal appearance of the esophagus. No enlarged mediastinal or hilar lymph nodes. Multiple enlarged right axillary lymph nodes identified. The largest measures 1.8 cm, image 56/3. Retropectoral lymph node measures 1 cm, image 34/3. Lungs/Pleura: No pleural effusion identified. No airspace consolidation or atelectasis. Nonspecific nodule within the lateral right apex measures 3 mm, image 22/4. Upper Abdomen: Extensive liver  metastases are again noted throughout the liver. 1.2 cm low-density right adrenal gland nodule measures -4.5 HU compatible with a benign abnormality. Musculoskeletal: No chest wall mass or suspicious bone lesions identified. IMPRESSION: 1. Enlarged right axillary and subpectoral lymph nodes are identified. In the setting of a right breast mass findings may represent metastatic adenopathy. These should be amendable to percutaneous tissue sampling under ultrasound guidance. 2. Diffuse liver metastasis 3. Tiny nodule within the lateral right apex is nonspecific measuring 3 mm. No follow-up needed if patient is low-risk. Non-contrast chest CT can be considered in 12 months if patient is high-risk. This recommendation follows the consensus statement: Guidelines for Management of Incidental Pulmonary Nodules Detected on CT Images: From the Fleischner Society 2017; Radiology 2017; 284:228-243. 4.  Aortic Atherosclerosis (ICD10-I70.0). Electronically Signed   By: Kerby Moors M.D.   On: 03/21/2018 12:28   US Abdomen Complete  Result Date: 03/09/2018 CLINICAL DATA:  Upper abdominal pain with nausea and vomiting. Weight loss. EXAM: ABDOMEN ULTRASOUND COMPLETE COMPARISON:  None. FINDINGS: Gallbladder: Within the gallbladder, there are echogenic foci which move and shadow consistent with cholelithiasis. Largest gallstone measures 1.9 cm in length. Gallbladder is mildly thickened without gallbladder edema or pericholecystic fluid. No sonographic Murphy sign noted by sonographer. Common bile duct: Diameter: 3 mm. No intrahepatic, common hepatic, or common bile duct dilatation. Liver: There are mass lesions throughout the liver involving all lobes in segments. Several of these mass lesions appear infiltrative; others appear more discrete. Largest individual mass is seen on the right measuring 3.6 x 2.1 x 3.7 cm. Note that the liver contour is somewhat lobular. Liver echogenicity is inhomogeneous. Portal vein is patent on color Doppler imaging with normal direction of blood flow towards the liver. IVC: No abnormality visualized. Pancreas: Visualized portion unremarkable. Portions of pancreas obscured by gas. Spleen: Size and appearance within normal limits. Right Kidney: Length: 8.5 cm. Echogenicity within normal limits. No mass or hydronephrosis visualized. Left Kidney: Length: 10.1 cm. Echogenicity within normal limits. No mass or hydronephrosis visualized. Abdominal aorta: No aneurysm visualized. Other findings: No demonstrable ascites. IMPRESSION: 1. Mass lesions throughout the liver, consistent with metastatic disease. Liver as a somewhat nodular contour suggesting underlying hepatic cirrhosis. Inhomogeneous echotexture to the liver. 2. Cholelithiasis with mild gallbladder wall thickening. A degree of cholecystitis cannot be excluded by ultrasound. 3. Portions of pancreas obscured by gas. Visualized portions of pancreas appear normal. 4. Small right kidney. Etiology uncertain.  This finding potentially may be indicative of renal artery stenosis. In this regard, question whether patient is hypertensive. These results will be called to the ordering clinician or representative by the Radiologist Assistant, and communication documented in the PACS or zVision Dashboard. Electronically Signed   By: Lowella Grip III M.D.   On: 03/09/2018 11:31   Ct Abdomen Pelvis W Contrast  Result Date: 03/15/2018 CLINICAL DATA:  Epigastric pain with nausea for several months. Weight loss. Liver mass on ultrasound. EXAM: CT ABDOMEN AND PELVIS WITH CONTRAST TECHNIQUE: Multidetector CT imaging of the abdomen and pelvis was performed using the standard protocol following bolus administration of intravenous contrast. CONTRAST:  157m ISOVUE-300 IOPAMIDOL (ISOVUE-300) INJECTION 61% COMPARISON:  03/09/2018 abdominal ultrasound. FINDINGS: Lower chest: Clear lung bases. Normal heart size without pericardial or pleural effusion. Distal esophageal wall thickening is mild on image 1/3. Relatively well-circumscribed lateral right breast 2.6 cm soft tissue density lesion on image 2/3. Hepatobiliary: Multiple bilateral liver masses, consistent with metastatic disease. Index right hepatic lobe lesion measures 3.3 x  3.2 cm on image 20/3. Lateral segment left liver lobe 5.9 x 4.6 cm lesion image 12/3. Normal gallbladder, without biliary ductal dilatation. Pancreas: Normal, without mass or ductal dilatation. Spleen: Normal in size, without focal abnormality. Adrenals/Urinary Tract: Minimal right adrenal nodularity. Normal left adrenal gland. Normal kidneys, without hydronephrosis. Normal urinary bladder. Stomach/Bowel: Normal stomach, without wall thickening. The distal sigmoid is underdistended. Possible wall thickening at the rectosigmoid junction on 66/3. Normal terminal ileum and appendix. Normal small bowel. Vascular/Lymphatic: Normal caliber of the aorta and branch vessels. No abdominopelvic adenopathy.  Reproductive: Retroverted uterus.  No adnexal mass. Other: No significant free fluid. No evidence of omental or peritoneal disease. Musculoskeletal: Disc bulges at L4-5 and L5-S1. IMPRESSION: 1. Widespread hepatic metastasis. 2. 2.6 cm lateral right breast soft tissue nodule could represent a breast primary or an incidental benign lesion. Consider correlation with mammogram and ultrasound. 3. No definite source of primary malignancy identified within the abdomen or pelvis. There is possible rectosigmoid junction wall thickening. Consider colonoscopy with attention to this area. 4. Distal esophageal wall thickening, suggesting esophagitis. Electronically Signed   By: Abigail Miyamoto M.D.   On: 03/15/2018 16:00   US Breast Ltd Uni Left Inc Axilla  Result Date: 03/22/2018 CLINICAL DATA:  62 year old female with palpable RIGHT breast mass discovered on clinical examination. EXAM: DIGITAL DIAGNOSTIC BILATERAL MAMMOGRAM WITH CAD AND TOMO ULTRASOUND BILATERAL BREAST COMPARISON:  None ACR Breast Density Category b: There are scattered areas of fibroglandular density. FINDINGS: 2D/3D full field views of both breasts demonstrate the following: A circumscribed oval mass within the UPPER-OUTER RIGHT breast. A spiculated mass containing calcifications within the UPPER-OUTER RIGHT breast. Mass and calcifications span a distance of 4 cm. Two 6 mm partially circumscribed partially obscured masses within the LEFT breast, 1 within the UPPER-OUTER LEFT breast and 1 within the INNER LEFT breast. Mammographic images were processed with CAD. On physical exam, a firm palpable mass is identified at the 9 o'clock position of the RIGHT breast 7 cm from the nipple. Targeted ultrasound is performed, showing the following: RIGHT breast and axilla: A 1.4 x 1.6 x 2.3 cm irregular hypoechoic mass at the 11:30 position 2 cm from the nipple. A 2.1 x 1.6 x 2.5 cm hypoechoic solid mass at the 9 o'clock position 7 cm from the nipple, corresponding to  the patient's palpable abnormality and may represent an enlarged abnormal intramammary lymph node. Five enlarged RIGHT axillary lymph nodes with thickened cortices. LEFT breast and axilla: A 0.7 x 0.4 x 0.6 cm circumscribed oval hypoechoic parallel mass at the 9 o'clock position 3 cm from the nipple. A 0.9 x 0.3 x 0.7 cm circumscribed oval hypoechoic parallel mass at the 2 o'clock position 3 cm from the nipple. No abnormal LEFT axillary lymph nodes are identified. IMPRESSION: 1. Highly suspicious 2.3 cm UPPER-OUTER RIGHT breast mass (entire area measuring 4 cm mammographically with associated calcifications), 2.5 cm solid mass/abnormal intraparenchymal lymph node within the OUTER RIGHT breast and 5 abnormal RIGHT axillary lymph nodes. Tissue sampling recommended. 2. Indeterminate 0.7 cm INNER LEFT breast mass and 0.9 cm UPPER-OUTER LEFT breast mass. Given RIGHT breast findings, tissue sampling recommended. No abnormal LEFT axillary lymph nodes. RECOMMENDATION: 1. Three ultrasound-guided biopsies of the RIGHT breast - UPPER-OUTER RIGHT breast mass, OUTER RIGHT breast mass and 1 of the enlarged RIGHT axillary lymph nodes. 2. Two ultrasound-guided biopsies of the LEFT breast - masses as described. These biopsies will be arranged. I have discussed the findings and recommendations with the patient.  Results were also provided in writing at the conclusion of the visit. If applicable, a reminder letter will be sent to the patient regarding the next appointment. BI-RADS CATEGORY  5: Highly suggestive of malignancy. Electronically Signed   By: Margarette Canada M.D.   On: 03/22/2018 12:11   US Breast Ltd Uni Right Inc Axilla  Result Date: 03/22/2018 CLINICAL DATA:  62 year old female with palpable RIGHT breast mass discovered on clinical examination. EXAM: DIGITAL DIAGNOSTIC BILATERAL MAMMOGRAM WITH CAD AND TOMO ULTRASOUND BILATERAL BREAST COMPARISON:  None ACR Breast Density Category b: There are scattered areas of  fibroglandular density. FINDINGS: 2D/3D full field views of both breasts demonstrate the following: A circumscribed oval mass within the UPPER-OUTER RIGHT breast. A spiculated mass containing calcifications within the UPPER-OUTER RIGHT breast. Mass and calcifications span a distance of 4 cm. Two 6 mm partially circumscribed partially obscured masses within the LEFT breast, 1 within the UPPER-OUTER LEFT breast and 1 within the INNER LEFT breast. Mammographic images were processed with CAD. On physical exam, a firm palpable mass is identified at the 9 o'clock position of the RIGHT breast 7 cm from the nipple. Targeted ultrasound is performed, showing the following: RIGHT breast and axilla: A 1.4 x 1.6 x 2.3 cm irregular hypoechoic mass at the 11:30 position 2 cm from the nipple. A 2.1 x 1.6 x 2.5 cm hypoechoic solid mass at the 9 o'clock position 7 cm from the nipple, corresponding to the patient's palpable abnormality and may represent an enlarged abnormal intramammary lymph node. Five enlarged RIGHT axillary lymph nodes with thickened cortices. LEFT breast and axilla: A 0.7 x 0.4 x 0.6 cm circumscribed oval hypoechoic parallel mass at the 9 o'clock position 3 cm from the nipple. A 0.9 x 0.3 x 0.7 cm circumscribed oval hypoechoic parallel mass at the 2 o'clock position 3 cm from the nipple. No abnormal LEFT axillary lymph nodes are identified. IMPRESSION: 1. Highly suspicious 2.3 cm UPPER-OUTER RIGHT breast mass (entire area measuring 4 cm mammographically with associated calcifications), 2.5 cm solid mass/abnormal intraparenchymal lymph node within the OUTER RIGHT breast and 5 abnormal RIGHT axillary lymph nodes. Tissue sampling recommended. 2. Indeterminate 0.7 cm INNER LEFT breast mass and 0.9 cm UPPER-OUTER LEFT breast mass. Given RIGHT breast findings, tissue sampling recommended. No abnormal LEFT axillary lymph nodes. RECOMMENDATION: 1. Three ultrasound-guided biopsies of the RIGHT breast - UPPER-OUTER RIGHT  breast mass, OUTER RIGHT breast mass and 1 of the enlarged RIGHT axillary lymph nodes. 2. Two ultrasound-guided biopsies of the LEFT breast - masses as described. These biopsies will be arranged. I have discussed the findings and recommendations with the patient. Results were also provided in writing at the conclusion of the visit. If applicable, a reminder letter will be sent to the patient regarding the next appointment. BI-RADS CATEGORY  5: Highly suggestive of malignancy. Electronically Signed   By: Margarette Canada M.D.   On: 03/22/2018 12:11   Mm Diag Breast Tomo Bilateral  Result Date: 03/22/2018 CLINICAL DATA:  62 year old female with palpable RIGHT breast mass discovered on clinical examination. EXAM: DIGITAL DIAGNOSTIC BILATERAL MAMMOGRAM WITH CAD AND TOMO ULTRASOUND BILATERAL BREAST COMPARISON:  None ACR Breast Density Category b: There are scattered areas of fibroglandular density. FINDINGS: 2D/3D full field views of both breasts demonstrate the following: A circumscribed oval mass within the UPPER-OUTER RIGHT breast. A spiculated mass containing calcifications within the UPPER-OUTER RIGHT breast. Mass and calcifications span a distance of 4 cm. Two 6 mm partially circumscribed partially obscured masses within  the LEFT breast, 1 within the UPPER-OUTER LEFT breast and 1 within the INNER LEFT breast. Mammographic images were processed with CAD. On physical exam, a firm palpable mass is identified at the 9 o'clock position of the RIGHT breast 7 cm from the nipple. Targeted ultrasound is performed, showing the following: RIGHT breast and axilla: A 1.4 x 1.6 x 2.3 cm irregular hypoechoic mass at the 11:30 position 2 cm from the nipple. A 2.1 x 1.6 x 2.5 cm hypoechoic solid mass at the 9 o'clock position 7 cm from the nipple, corresponding to the patient's palpable abnormality and may represent an enlarged abnormal intramammary lymph node. Five enlarged RIGHT axillary lymph nodes with thickened cortices. LEFT  breast and axilla: A 0.7 x 0.4 x 0.6 cm circumscribed oval hypoechoic parallel mass at the 9 o'clock position 3 cm from the nipple. A 0.9 x 0.3 x 0.7 cm circumscribed oval hypoechoic parallel mass at the 2 o'clock position 3 cm from the nipple. No abnormal LEFT axillary lymph nodes are identified. IMPRESSION: 1. Highly suspicious 2.3 cm UPPER-OUTER RIGHT breast mass (entire area measuring 4 cm mammographically with associated calcifications), 2.5 cm solid mass/abnormal intraparenchymal lymph node within the OUTER RIGHT breast and 5 abnormal RIGHT axillary lymph nodes. Tissue sampling recommended. 2. Indeterminate 0.7 cm INNER LEFT breast mass and 0.9 cm UPPER-OUTER LEFT breast mass. Given RIGHT breast findings, tissue sampling recommended. No abnormal LEFT axillary lymph nodes. RECOMMENDATION: 1. Three ultrasound-guided biopsies of the RIGHT breast - UPPER-OUTER RIGHT breast mass, OUTER RIGHT breast mass and 1 of the enlarged RIGHT axillary lymph nodes. 2. Two ultrasound-guided biopsies of the LEFT breast - masses as described. These biopsies will be arranged. I have discussed the findings and recommendations with the patient. Results were also provided in writing at the conclusion of the visit. If applicable, a reminder letter will be sent to the patient regarding the next appointment. BI-RADS CATEGORY  5: Highly suggestive of malignancy. Electronically Signed   By: Margarette Canada M.D.   On: 03/22/2018 12:11   [place] Axillary Node Core Biopsy Right  Result Date: 03/24/2018 CLINICAL DATA:  62 year old who presented with a palpable lump in the OUTER RIGHT breast which was shown on diagnostic workup to represent an approximate 2.5 cm suspicious mass at the 9 o'clock position approximately 7 cm from the nipple. Diagnostic workup demonstrated a second highly suspicious 2.3 cm mass at the 11:30 o'clock position approximately 2 cm from the nipple associated with architectural distortion. A total of 5 abnormal lymph nodes were  identified in the RIGHT axilla on the diagnostic ultrasound. Biopsy of the 2 breast masses and a RIGHT axillary node is performed. Of note, a recent CT of the abdomen and pelvis demonstrated innumerable liver metastases and a possible mass involving the rectum. EXAM: ULTRASOUND GUIDED RIGHT BREAST CORE NEEDLE BIOPSY x 2 ULTRASOUND GUIDED RIGHT AXILLARY NODE CORE NEEDLE BIOPSY COMPARISON:  Previous exam(s). FINDINGS: I met with the patient and we discussed the procedure of ultrasound-guided biopsy, including benefits and alternatives. We discussed the high likelihood of a successful procedure. We discussed the risks of the procedure, including infection, bleeding, tissue injury, clip migration, and inadequate sampling. Informed written consent was given. The usual time-out protocol was performed immediately prior to the procedure. Lesion quadrant (labeled # 1 for pathology, 11:30 o'clock position approximately 2 cm from the nipple): UPPER OUTER QUADRANT. Using sterile technique with chlorhexidine as skin antisepsis, 1% lidocaine and 1% lidocaine with epinephrine as local anesthetic, under direct ultrasound visualization,  a 12 gauge Bard Marquee core needle device was used to initially perform biopsy of the suspicious mass in the [place] at the 11:30 o'clock position approximately 2 cm from the nipple using a LATERAL approach. At the conclusion of the procedure a ribbon shaped tissue marker clip was deployed into the biopsy cavity. Lesion quadrant (labeled # 2 for pathology, 9 o'clock position approximately 7 cm from the nipple): Slight LOWER OUTER QUADRANT. Subsequently, using sterile technique with chlorhexidine as skin antisepsis, 1% lidocaine and 1% lidocaine with epinephrine as local anesthetic, under direct ultrasound visualization, a 12 gauge Bard Marquee core needle device was used to perform biopsy of the palpable mass in the OUTER RIGHT breast at the 9 o'clock position approximately 7 cm from  the nipple using a LATERAL approach. At the conclusion of the procedure a coil shaped tissue marker clip was deployed into the biopsy cavity. Finally, using sterile technique with chlorhexidine as skin antisepsis, 1% lidocaine and 1% lidocaine with epinephrine as local anesthetic, under direct ultrasound visualization, a 14 gauge Bard Marquee core needle device was used to perform biopsy of 1 of the pathologic RIGHT axillary lymph nodes using a LATERAL approach. At the conclusion of the procedure a spiral shaped HydroMark tissue marker clip was deployed into the biopsy cavity. The RIGHT axilla biopsy samples are labeled # 3 for pathology. Follow-up two-view mammogram was performed and is dictated separately. IMPRESSION: Ultrasound guided biopsy of 2 suspicious masses involving the RIGHT breast and a pathologic RIGHT axillary lymph node. No apparent complications. Electronically Signed   By: Evangeline Dakin M.D.   On: 03/24/2018 16:31   Mm Clip Placement Right  Result Date: 03/24/2018 CLINICAL DATA:  Confirmation of clip placement after ultrasound-guided core needle biopsy of 2 suspicious masses in the RIGHT breast and a pathologic RIGHT axillary lymph node. EXAM: DIAGNOSTIC RIGHT MAMMOGRAM POST ULTRASOUND BIOPSY COMPARISON:  Previous exam(s). FINDINGS: Mammographic images were obtained following ultrasound-guided core needle biopsy of 2 masses involving the RIGHT breast and a pathologic RIGHT axillary lymph node. The ribbon shaped tissue marker clip is appropriately positioned within the highly suspicious mass associated with architectural distortion involving the UPPER OUTER QUADRANT at MIDDLE depth at the 11:30 o'clock position. The LATERAL extent of the adjacent suspicious calcifications associated with this mass are approximately 2.5 cm LATERAL to the ribbon shaped clip. The coil shaped tissue marker clip is appropriately position within the suspicious palpable mass in the OUTER LEFT breast at MIDDLE depth  at the 9 o'clock position. The spiral shaped HydroMark clip placed into the RIGHT axillary lymph node is not visible due to the depth of the lymph node. The distance between the ribbon clip and the coil clip is approximately 6.3 cm. The masses nearly overlap each another on the mediolateral and MLO views. Expected post biopsy changes are present without evidence of hematoma. IMPRESSION: 1. Appropriate positioning of the ribbon shaped tissue marker clip within the mass associated with architectural distortion and suspicious calcifications in the UPPER OUTER QUADRANT of the RIGHT breast at MIDDLE depth, 11:30 o'clock position 2 cm from the nipple. The calcifications extend approximately 2.5 cm LATERAL to the ribbon clip. 2. Appropriate positioning of the coil shaped tissue marker clip within the palpable mass in the OUTER RIGHT breast at MIDDLE depth, 9 o'clock position 7 cm from the nipple. 3. The distance between the ribbon clip in the coil clip is approximately 6.3 cm. Final Assessment: Post Procedure Mammograms for Marker Placement Electronically Signed  By: Evangeline Dakin M.D.   On: 03/24/2018 16:38   [place] Rt Breast Bx W Loc Dev 1st Lesion Img Bx Spec US Guide  Result Date: 03/24/2018 CLINICAL DATA:  62 year old who presented with a palpable lump in the OUTER RIGHT breast which was shown on diagnostic workup to represent an approximate 2.5 cm suspicious mass at the 9 o'clock position approximately 7 cm from the nipple. Diagnostic workup demonstrated a second highly suspicious 2.3 cm mass at the 11:30 o'clock position approximately 2 cm from the nipple associated with architectural distortion. A total of 5 abnormal lymph nodes were identified in the RIGHT axilla on the diagnostic ultrasound. Biopsy of the 2 breast masses and a RIGHT axillary node is performed. Of note, a recent CT of the abdomen and pelvis demonstrated innumerable liver metastases and a possible mass involving the rectum. EXAM: ULTRASOUND  GUIDED RIGHT BREAST CORE NEEDLE BIOPSY x 2 ULTRASOUND GUIDED RIGHT AXILLARY NODE CORE NEEDLE BIOPSY COMPARISON:  Previous exam(s). FINDINGS: I met with the patient and we discussed the procedure of ultrasound-guided biopsy, including benefits and alternatives. We discussed the high likelihood of a successful procedure. We discussed the risks of the procedure, including infection, bleeding, tissue injury, clip migration, and inadequate sampling. Informed written consent was given. The usual time-out protocol was performed immediately prior to the procedure. Lesion quadrant (labeled # 1 for pathology, 11:30 o'clock position approximately 2 cm from the nipple): UPPER OUTER QUADRANT. Using sterile technique with chlorhexidine as skin antisepsis, 1% lidocaine and 1% lidocaine with epinephrine as local anesthetic, under direct ultrasound visualization, a 12 gauge Bard Marquee core needle device was used to initially perform biopsy of the suspicious mass in the UPPER OUTER QUADRANT at the 11:30 o'clock position approximately 2 cm from the nipple using a LATERAL approach. At the conclusion of the procedure a ribbon shaped tissue marker clip was deployed into the biopsy cavity. Lesion quadrant (labeled # 2 for pathology, 9 o'clock position approximately 7 cm from the nipple): Slight LOWER OUTER QUADRANT. Subsequently, using sterile technique with chlorhexidine as skin antisepsis, 1% lidocaine and 1% lidocaine with epinephrine as local anesthetic, under direct ultrasound visualization, a 12 gauge Bard Marquee core needle device was used to perform biopsy of the palpable mass in the OUTER RIGHT breast at the 9 o'clock position approximately 7 cm from the nipple using a LATERAL approach. At the conclusion of the procedure a coil shaped tissue marker clip was deployed into the biopsy cavity. Finally, using sterile technique with chlorhexidine as skin antisepsis, 1% lidocaine and 1% lidocaine with epinephrine as local anesthetic,  under direct ultrasound visualization, a 14 gauge Bard Marquee core needle device was used to perform biopsy of 1 of the pathologic RIGHT axillary lymph nodes using a LATERAL approach. At the conclusion of the procedure a spiral shaped HydroMark tissue marker clip was deployed into the biopsy cavity. The RIGHT axilla biopsy samples are labeled # 3 for pathology. Follow-up two-view mammogram was performed and is dictated separately. IMPRESSION: Ultrasound guided biopsy of 2 suspicious masses involving the RIGHT breast and a pathologic RIGHT axillary lymph node. No apparent complications. Electronically Signed   By: Evangeline Dakin M.D.   On: 03/24/2018 16:31   [place] Rt Breast Bx W Loc Dev Ea Add Lesion Img Bx Spec US Guide  Result Date: 03/24/2018 CLINICAL DATA:  62 year old who presented with a palpable lump in the OUTER RIGHT breast which was shown on diagnostic workup to represent an approximate 2.5 cm suspicious mass at  the 9 o'clock position approximately 7 cm from the nipple. Diagnostic workup demonstrated a second highly suspicious 2.3 cm mass at the 11:30 o'clock position approximately 2 cm from the nipple associated with architectural distortion. A total of 5 abnormal lymph nodes were identified in the RIGHT axilla on the diagnostic ultrasound. Biopsy of the 2 breast masses and a RIGHT axillary node is performed. Of note, a recent CT of the abdomen and pelvis demonstrated innumerable liver metastases and a possible mass involving the rectum. EXAM: ULTRASOUND GUIDED RIGHT BREAST CORE NEEDLE BIOPSY x 2 ULTRASOUND GUIDED RIGHT AXILLARY NODE CORE NEEDLE BIOPSY COMPARISON:  Previous exam(s). FINDINGS: I met with the patient and we discussed the procedure of ultrasound-guided biopsy, including benefits and alternatives. We discussed the high likelihood of a successful procedure. We discussed the risks of the procedure, including infection, bleeding, tissue injury, clip migration, and inadequate sampling. Informed  written consent was given. The usual time-out protocol was performed immediately prior to the procedure. Lesion quadrant (labeled # 1 for pathology, 11:30 o'clock position approximately 2 cm from the nipple): UPPER OUTER QUADRANT. Using sterile technique with chlorhexidine as skin antisepsis, 1% lidocaine and 1% lidocaine with epinephrine as local anesthetic, under direct ultrasound visualization, a 12 gauge Bard Marquee core needle device was used to initially perform biopsy of the suspicious mass in the UPPER OUTER QUADRANT at the 11:30 o'clock position approximately 2 cm from the nipple using a LATERAL approach. At the conclusion of the procedure a ribbon shaped tissue marker clip was deployed into the biopsy cavity. Lesion quadrant (labeled # 2 for pathology, 9 o'clock position approximately 7 cm from the nipple): Slight LOWER OUTER QUADRANT. Subsequently, using sterile technique with chlorhexidine as skin antisepsis, 1% lidocaine and 1% lidocaine with epinephrine as local anesthetic, under direct ultrasound visualization, a 12 gauge Bard Marquee core needle device was used to perform biopsy of the palpable mass in the OUTER RIGHT breast at the 9 o'clock position approximately 7 cm from the nipple using a LATERAL approach. At the conclusion of the procedure a coil shaped tissue marker clip was deployed into the biopsy cavity. Finally, using sterile technique with chlorhexidine as skin antisepsis, 1% lidocaine and 1% lidocaine with epinephrine as local anesthetic, under direct ultrasound visualization, a 14 gauge Bard Marquee core needle device was used to perform biopsy of 1 of the pathologic RIGHT axillary lymph nodes using a LATERAL approach. At the conclusion of the procedure a spiral shaped HydroMark tissue marker clip was deployed into the biopsy cavity. The RIGHT axilla biopsy samples are labeled # 3 for pathology. Follow-up two-view mammogram was performed and is dictated separately. IMPRESSION:  Ultrasound guided biopsy of 2 suspicious masses involving the RIGHT breast and a pathologic RIGHT axillary lymph node. No apparent complications. Electronically Signed   By: Evangeline Dakin M.D.   On: 03/24/2018 16:31    Labs:  CBC: Recent Labs    03/16/18 1608 03/27/18 0650  WBC 7.7 7.7  HGB 12.6 13.0  HCT 37.2 40.5  PLT 303 275    COAGS: Recent Labs    03/16/18 1608  INR 0.97  APTT 29    BMP: Recent Labs    03/16/18 1608  NA 136  K 4.0  CL 95*  CO2 27  GLUCOSE 108*  BUN 21  CALCIUM 10.3  CREATININE 1.20*  GFRNONAA 48*  GFRAA 55*    LIVER FUNCTION TESTS: Recent Labs    03/16/18 1608  BILITOT 0.6  AST 190*  ALT 150*  ALKPHOS 230*  PROT 8.0  ALBUMIN 4.2    TUMOR MARKERS: No results for input(s): AFPTM, CEA, CA199, CHROMGRNA in the last 8760 hours.  Assessment and Plan:  abd pain; wt loss; dysphagia Breast mass and liver lesions Breast mass biopsy 9/6-- pending result Scheduled for liver lesion biopsy Risks and benefits discussed with the patient including, but not limited to bleeding, infection, damage to adjacent structures or low yield requiring additional tests.  All of the patient's questions were answered, patient is agreeable to proceed. Consent signed and in chart.    Thank you for this interesting consult.  I greatly enjoyed meeting Teresa Hays and look forward to participating in their care.  A copy of this report was sent to the requesting provider on this date.  Electronically Signed: Lavonia Drafts, PA-C 03/27/2018, 7:31 AM   I spent a total of  30 Minutes   in face to face in clinical consultation, greater than 50% of which was counseling/coordinating care for liver lesion bx

## 2018-03-27 NOTE — Progress Notes (Signed)
5483 On arrival to short stay area pt c/o dizziness. VSS, RUQ band aid normal. No pain. Pt was placed in trendelenburg but did not help symptom of dizziness. In absence of other SS, medications may make her feel this way. Will continue to monitor and report as necessary. 0930 Pt is sipping fluids and sitting upright without further incident and dizziness is getting better.

## 2018-03-27 NOTE — Procedures (Signed)
  Procedure: US core liver lesion 18g x3 EBL:   minimal Complications:  none immediate  See full dictation in [facility] PACS.  D. Janyce Ellinger MD Main # [phone] Pager  [phone]    

## 2018-03-27 NOTE — Discharge Instructions (Signed)
Liver Biopsy, Care After °These instructions give you information on caring for yourself after your procedure. Your doctor may also give you more specific instructions. Call your doctor if you have any problems or questions after your procedure. °Follow these instructions at home: °· Rest at home for 1-2 days or as told by your doctor. °· Have someone stay with you for at least 24 hours. °· Do not do these things in the first 24 hours: °? Drive. °? Use machinery. °? Take care of other people. °? Sign legal documents. °? Take a bath or shower. °· There are many different ways to close and cover a cut (incision). For example, a cut can be closed with stitches, skin glue, or adhesive strips. Follow your doctor's instructions on: °? Taking care of your cut. °? Changing and removing your bandage (dressing). °? Removing whatever was used to close your cut. °· Do not drink alcohol in the first week. °· Do not lift more than 5 pounds or play contact sports for the first 2 weeks. °· Take medicines only as told by your doctor. For 1 week, do not take medicine that has aspirin in it or medicines like ibuprofen. °· Get your test results. °Contact a doctor if: °· A cut bleeds and leaves more than just a small spot of blood. °· A cut is red, puffs up (swells), or hurts more than before. °· Fluid or something else comes from a cut. °· A cut smells bad. °· You have a fever or chills. °Get help right away if: °· You have swelling, bloating, or pain in your belly (abdomen). °· You get dizzy or faint. °· You have a rash. °· You feel sick to your stomach (nauseous) or throw up (vomit). °· You have trouble breathing, feel short of breath, or feel faint. °· Your chest hurts. °· You have problems talking or seeing. °· You have trouble balancing or moving your arms or legs. °This information is not intended to replace advice given to you by your health care provider. Make sure you discuss any questions you have with your health care  provider. °Document Released: 04/13/2008 Document Revised: 12/11/2015 Document Reviewed: 08/31/2013 °[employer] Interactive Patient Education © 2018 [employer] ° °

## 2018-03-28 ENCOUNTER — Other Ambulatory Visit: Payer: Self-pay | Admitting: Hematology

## 2018-03-28 ENCOUNTER — Ambulatory Visit
Admission: RE | Admit: 2018-03-28 | Discharge: 2018-03-28 | Disposition: A | Discharge status: Home / Self Care | Payer: 59 | Source: Ambulatory Visit | Attending: Hematology | Admitting: Hematology

## 2018-03-28 DIAGNOSIS — N6320 Unspecified lump in the left breast, unspecified quadrant: Secondary | ICD-10-CM

## 2018-03-28 DIAGNOSIS — D242 Benign neoplasm of left breast: Secondary | ICD-10-CM | POA: Diagnosis not present

## 2018-03-28 DIAGNOSIS — N6322 Unspecified lump in the left breast, upper inner quadrant: Secondary | ICD-10-CM | POA: Diagnosis not present

## 2018-03-28 DIAGNOSIS — N6012 Diffuse cystic mastopathy of left breast: Secondary | ICD-10-CM | POA: Diagnosis not present

## 2018-03-28 DIAGNOSIS — N6321 Unspecified lump in the left breast, upper outer quadrant: Secondary | ICD-10-CM | POA: Diagnosis not present

## 2018-03-28 HISTORY — PX: BREAST BIOPSY: SHX20

## 2018-03-28 NOTE — Progress Notes (Signed)
[place]  [phone] [phone]  Clinic Follow-up Note   Patient Care Team: Levin Bacon, NP as PCP - General (Family Medicine) [name], MD as Consulting Physician (Gastroenterology) [name], MD as Consulting Physician (Hematology) 03/30/2018   CHIEF COMPLAINTS:  Discuss scan and biopsy results   Oncology History   Cancer Staging Metastatic breast cancer [facility]) Staging form: Breast, AJCC 8th Edition - Clinical stage from 03/24/2018: Stage IV (cT2, cN1, pM1, G3, ER+, PR+, HER2+) - Signed by [name], MD on 03/30/2018       Metastatic breast cancer ([place])   01/30/2018 Procedure    Colonoscopy showed small polyp in the sigmoid colon, removed, the exam of colon including the terminal ileum was otherwise negative.    01/30/2018 Procedure    EGD by Dr. Collene Mares showed small hiatal hernia, a 8 mm polypoid lesion in the cardia, biopsied.    03/09/2018 Imaging    03/09/2018 US Abdomen IMPRESSION: 1. Mass lesions throughout the liver, consistent with metastatic disease. Liver as a somewhat nodular contour suggesting underlying hepatic cirrhosis. Inhomogeneous echotexture to the liver.  2. Cholelithiasis with mild gallbladder wall thickening. A degree of cholecystitis cannot be excluded by ultrasound.  3. Portions of pancreas obscured by gas. Visualized portions of pancreas appear normal.  4. Small right kidney. Etiology uncertain. This finding potentially may be indicative of renal artery stenosis. In this regard, question whether patient is hypertensive.    03/15/2018 Imaging    CT CAP with contrast  IMPRESSION: 1. Widespread hepatic metastasis. 2. 2.6 cm lateral right breast soft tissue nodule could represent a breast primary or an incidental benign lesion. Consider correlation with mammogram and ultrasound. 3. No definite source of primary malignancy identified within the abdomen or pelvis. There is possible rectosigmoid junction wall  thickening. Consider colonoscopy with attention to this area. 4. Distal esophageal wall thickening, suggesting esophagitis.    03/24/2018 Cancer Staging    Staging form: Breast, AJCC 8th Edition - Clinical stage from 03/24/2018: Stage IV (cT2, cN1, pM1, G3, ER+, PR+, HER2+) - Signed by [name], MD on 03/30/2018    03/24/2018 Initial Biopsy    Diagnosis 1. 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 Breast, right, needle core biopsy, 9 o'clock, 7cm from nipple - INVASIVE DUCTAL CARCINOMA. -The carcinoma is somewhat morphologically dissimilar from that in part 1. It appears grade III 3. Lymph node, needle/core biopsy, right axillary - METASTATIC CARCINOMA IN 1 OF 1 LYMPH NODE (1/1).    03/24/2018 Receptors her2    Breast biopsy: 1. Estrogen Receptor: 40%, POSITIVE, STRONG-MODERATE STAINING INTENSITY Progesterone Receptor: 70%, POSITIVE, STRONG STAINING INTENSITY Proliferation Marker Ki67: 20% HER 2 equivocal by IHC 2+, POSITIVE by FISH, ratio 2.4 and copy #4.2  2. Estrogen Receptor: 60%, POSITIVE, MODERATE STAINING INTENSITY Progesterone Receptor: 40%, POSITIVE, MODERATE STAINING INTENSITY Proliferation Marker Ki67: 20% HER2 (+) by IHC 3+    03/24/2018 Initial Diagnosis    Metastatic breast cancer ([place])    03/27/2018 Pathology Results    Diagnosis Liver, needle/core biopsy, Right - METASTATIC CARCINOMA TO LIVER, CONSISTENT WITH PATIENTS CLINICAL HISTORY OF PRIMARY BREAST CARCINOMA.  ER 80%+ PR40%+ HER2- (by [facility] IHC 2+)  Ki67 50%     03/28/2018 Pathology Results    03/28/2018 Surgical Pathology Diagnosis 1. Breast, left, needle core biopsy, 9 o'clock - FIBROCYSTIC CHANGES. - THERE IS NO EVIDENCE OF MALIGNANCY. 2. Breast, left, needle  core biopsy, 2 o'clock - FIBROADENOMA. - THERE IS NO EVIDENCE OF MALIGNANCY. - SEE COMMENT.    03/29/2018 Imaging    03/29/2018 Bone Scan IMPRESSION: No  scintigraphic evidence of osseous metastatic disease.    03/30/2018 Imaging    Bone scan  IMPRESSION: No scintigraphic evidence of osseous metastatic disease.       HISTORY OF PRESENTING ILLNESS:  CHESNI VOS 62 y.o. female who is here with her family member. She works at [employer]. She went to see Dr. Collene Mares because she felt that there is something stuck in her throat with burning sensation in her abdomen. She then started vomiting bile. She reports losing weight and has no appetite. She finds it hard to eat as she feels nauseated after eating, she can still drink water. She can drinks smoothies and shakes. She takes omeprazole that helps. She also reports constipation and dry mouth. Her skin is also dry. She denies HA, CP, or skin rashes.   She also reports very bad lower back pain that doesn't allow her to sit on her couch after work. The pain is 7/10, and she doesn't use any pain medications.    G1P1 Age 82 with first child. 1 Child Age 10 at menarche   She has GERD and HTN. No alcohol or smoking. Her sister had breast cancer at age 36.   CURRENT THERAPY: PENDING first line chemo weekly Taxol and herceptin/Perjeta every 3 weeks   INTERVAL HISTORY ROSHINI FULWIDER is a 62 y.o. female who is here for follow-up. She is here sister and daughter. She has had CT scan, bone scan, 2 breast biopsies and a liver biopsy since her last visit.  She is emotional about there recent pathology results. She states that she cannot eat and is experiencing RUQ abdominal pain.  She rates the pain at 7-8 out of 10, persistent, she takes tramadol with some relief.  She has very low appetite and is afraid of eating due to the knee pain after eating.  She has lost a few more pounds since last visit.  She is fatigued, still able to work but feels exhausted.  Her family members are very supportive of her, and her sister states that she herself had breast cancer diagnosed and underwent partial  mastectomy, radiation, and tamoxifen therapy.   MEDICAL HISTORY:  Past Medical History:  Diagnosis Date  . GERD (gastroesophageal reflux disease)   . Hypertension     SURGICAL HISTORY: Past Surgical History:  Procedure Laterality Date  . COLONOSCOPY      SOCIAL HISTORY: Social History   Socioeconomic History  . Marital status: Single    Spouse name: Not on file  . Number of children: Not on file  . Years of education: Not on file  . Highest education level: Not on file  Occupational History  . Not on file  Social Needs  . Financial resource strain: Not on file  . Food insecurity:    Worry: Not on file    Inability: Not on file  . Transportation needs:    Medical: Not on file    Non-medical: Not on file  Tobacco Use  . Smoking status: Never Smoker  . Smokeless tobacco: Never Used  Substance and Sexual Activity  . Alcohol use: No  . Drug use: Not on file  . Sexual activity: Not on file  Lifestyle  . Physical activity:    Days per week: Not on file    Minutes per session: Not on  file  . Stress: Not on file  Relationships  . Social connections:    Talks on phone: Not on file    Gets together: Not on file    Attends religious service: Not on file    Active member of club or organization: Not on file    Attends meetings of clubs or organizations: Not on file    Relationship status: Not on file  . Intimate partner violence:    Fear of current or ex partner: Not on file    Emotionally abused: Not on file    Physically abused: Not on file    Forced sexual activity: Not on file  Other Topics Concern  . Not on file  Social History Narrative  . Not on file    FAMILY HISTORY: Family History  Problem Relation Age of Onset  . Diabetes Mother   . Cancer Sister 84       breast cancer  . Breast cancer Sister   . Rheum arthritis Sister     ALLERGIES:  has No Known Allergies.  MEDICATIONS:  Current Outpatient Medications  Medication Sig Dispense Refill  .  amLODipine (NORVASC) 5 MG tablet Take 5 mg by mouth daily.     Marland Kitchen atenolol (TENORMIN) 50 MG tablet Take 50 mg by mouth daily.     . hydrochlorothiazide (HYDRODIURIL) 25 MG tablet Take 25 mg by mouth daily.    Marland Kitchen linaclotide (LINZESS) 72 MCG capsule Take 72 mcg by mouth daily as needed (IBS).     . mirtazapine (REMERON) 7.5 MG tablet Take 1 tablet (7.5 mg total) by mouth at bedtime. 30 tablet 0  . ondansetron (ZOFRAN) 8 MG tablet Take 1 tablet (8 mg total) by mouth every 8 (eight) hours as needed for nausea or vomiting. 30 tablet 0  . ranitidine (ZANTAC) 150 MG tablet Take 150 mg by mouth 2 (two) times daily.     . traMADol (ULTRAM) 50 MG tablet Take 1 tablet (50 mg total) by mouth every 6 (six) hours as needed. 30 tablet 1   No current facility-administered medications for this visit.    Facility-Administered Medications Ordered in Other Visits  Medication Dose Route Frequency Provider Last Rate Last Dose  . technetium medronate (TC-MDP) injection 20 millicurie  20 millicurie Intravenous Once PRN Abigail Miyamoto, MD        REVIEW OF SYSTEMS:   Constitutional: Denies fevers, chills or abnormal night sweats (+) weight loss Eyes: Denies blurriness of vision, double vision or watery eyes Ears, nose, mouth, throat, and face: Denies mucositis or sore throat Respiratory: Denies cough, dyspnea or wheezes Cardiovascular: Denies palpitation, chest discomfort or lower extremity swelling Gastrointestinal:  Denies nausea, heartburn or change in bowel habits (+) RUQ pain  Skin: Denies abnormal skin rashes (+) dry skin Lymphatics: Denies new lymphadenopathy or easy bruising Neurological:Denies numbness, tingling or new weaknesses Behavioral/Psych: Mood is stable, no new changes  All other systems were reviewed with the patient and are negative.  PHYSICAL EXAMINATION:  ECOG PERFORMANCE STATUS: 2 - Symptomatic, <50% confined to bed  Vitals:   03/30/18 1400  BP: (!) 153/86  Pulse: 72  Resp: 18  Temp:  98 F (36.7 C)  SpO2: 100%   Filed Weights   03/30/18 1400  Weight: 143 lb 4.8 oz (65 kg)    GENERAL:alert, no distress and comfortable SKIN: skin color, texture, turgor are normal, no rashes or significant lesions EYES: normal, conjunctiva are pink and non-injected, sclera clear OROPHARYNX:no exudate, no erythema and  lips, buccal mucosa, and tongue normal  NECK: supple, thyroid normal size, non-tender, without nodularity LYMPH:  no palpable lymphadenopathy in the cervical, axillary or inguinal LUNGS: clear to auscultation and percussion with normal breathing effort HEART: regular rate & rhythm and no murmurs and no lower extremity edema ABDOMEN:abdomen soft, non-tender and normal bowel sounds (+) liver edge palpable 2-3 cm below costal margin Musculoskeletal:no cyanosis of digits and no clubbing  PSYCH: alert & oriented x 3 with fluent speech NEURO: no focal motor/sensory deficits BREAST: (+) 2.5 cm right breast mass at the 9 O Clock position, no other palpable breast mass or axillary adenopathy.  LABORATORY DATA:  I have reviewed the data as listed CBC Latest Ref Rng & Units 03/27/2018 03/16/2018  WBC 4.0 - 10.5 K/uL 7.7 7.7  Hemoglobin 12.0 - 15.0 g/dL 13.0 12.6  Hematocrit 36.0 - 46.0 % 40.5 37.2  Platelets 150 - 400 K/uL 275 303    CMP Latest Ref Rng & Units 03/16/2018 03/15/2018  Glucose 70 - 99 mg/dL 108(H) -  BUN 8 - 23 mg/dL 21 -  Creatinine 0.44 - 1.00 mg/dL 1.20(H) 1.10(H)  Sodium 135 - 145 mmol/L 136 -  Potassium 3.5 - 5.1 mmol/L 4.0 -  Chloride 98 - 111 mmol/L 95(L) -  CO2 22 - 32 mmol/L 27 -  Calcium 8.9 - 10.3 mg/dL 10.3 -  Total Protein 6.5 - 8.1 g/dL 8.0 -  Total Bilirubin 0.3 - 1.2 mg/dL 0.6 -  Alkaline Phos 38 - 126 U/L 230(H) -  AST 15 - 41 U/L 190(HH) -  ALT 0 - 44 U/L 150(H) -    Procedures  01/30/2018 EDG     01/30/2018 Colonoscopy       05/03/2008 Colonoscopy Assessment Normal examination.  Comments: NORMAL EXAMINATION.  NO POLYPS, NO  CANCERS.  PATHOLOGY  03/28/2018 Surgical Pathology Diagnosis 1. Breast, left, needle core biopsy, 9 o'clock - FIBROCYSTIC CHANGES. - THERE IS NO EVIDENCE OF MALIGNANCY. 2. Breast, left, needle core biopsy, 2 o'clock - FIBROADENOMA. - THERE IS NO EVIDENCE OF MALIGNANCY. - SEE COMMENT. Microscopic Comment 1. and 2. The results are called to [facility] on 03/29/18. (JBK:gt, 03/29/18) Enid Cutter MD Pathologist, Electronic Signature (Case signed 03/29/2018) Specimen Gross and Clinical Information Specimen Comment 1. TIF: 315 PM; extracted < 30 sec; left breast masses 2. TIF: 325 PM; extracted < 30 sec Specimen(s) Obtained: 1. Breast, left, needle core biopsy, 9 o'clock 2. Breast, left, needle core biopsy, 2 o'clock Specimen Clinical Information 1. Prob FCC 2. Prob FCC vs FA vs PASH Gross 1. Received in formalin labeled with the patient's name and "Left breast 9:00", are 1 core(s) of tan-yellow, fibroadipose tissue, measuring 1.7 x 0.2 x 0.2 cm. The specimen is entirely submitted in 1 cassette(s). 1 of 2 FINAL for JASMENE, GOSWAMI ([id number]) Gross(continued) Time in formalin 3:15 pm on 03/28/18. Cold ischemic time less than 30 secs. Craig Staggers 03/28/18 ) 2. Received in formalin labeled with the patient's name and "Left breast 2:00", are 4 core(s) of tan-yellow, fibroadipose tissue, ranging from 0.4 x 0.2 x 0.2 cm to 2.1 x 0.2 x 0.2 cm. The specimen is entirely submitted in 1 cassette(s). Time in formalin 3:25 pm on 03/28/18. Cold ischemic time less than 30 secs. Craig Staggers 03/28/18 )  01/30/2018 Surgical Pathology     RADIOGRAPHIC STUDIES: I have personally reviewed the radiological images as listed and agreed with the findings in the report.  03/29/2018 Bone Scan IMPRESSION: No scintigraphic evidence of  osseous metastatic disease.  03/15/2018 CT AP IMPRESSION: 1. Widespread hepatic metastasis. 2. 2.6 cm lateral right breast soft tissue nodule could represent  a breast primary or an incidental benign lesion. Consider correlation with mammogram and ultrasound. 3. No definite source of primary malignancy identified within the abdomen or pelvis. There is possible rectosigmoid junction wall thickening. Consider colonoscopy with attention to this area. 4. Distal esophageal wall thickening, suggesting esophagitis.  03/09/2018 US Abdomen IMPRESSION: 1. Mass lesions throughout the liver, consistent with metastatic disease. Liver as a somewhat nodular contour suggesting underlying hepatic cirrhosis. Inhomogeneous echotexture to the liver.  2. Cholelithiasis with mild gallbladder wall thickening. A degree of cholecystitis cannot be excluded by ultrasound.  3. Portions of pancreas obscured by gas. Visualized portions of pancreas appear normal.  4. Small right kidney. Etiology uncertain. This finding potentially may be indicative of renal artery stenosis. In this regard, question whether patient is hypertensive.   Ct Chest Wo Contrast  Result Date: 03/21/2018 CLINICAL DATA:  Liver metastasis. Evaluate for metastatic disease within the chest. EXAM: CT CHEST WITHOUT CONTRAST TECHNIQUE: Multidetector CT imaging of the chest was performed following the standard protocol without IV contrast. COMPARISON:  03/15/2018 FINDINGS: Cardiovascular: The heart size is normal. Mild aortic atherosclerosis. Mediastinum/Nodes: Normal appearance of the thyroid gland. The trachea appears patent and is midline. Normal appearance of the esophagus. No enlarged mediastinal or hilar lymph nodes. Multiple enlarged right axillary lymph nodes identified. The largest measures 1.8 cm, image 56/3. Retropectoral lymph node measures 1 cm, image 34/3. Lungs/Pleura: No pleural effusion identified. No airspace consolidation or atelectasis. Nonspecific nodule within the lateral right apex measures 3 mm, image 22/4. Upper Abdomen: Extensive liver metastases are again noted throughout the liver.  1.2 cm low-density right adrenal gland nodule measures -4.5 HU compatible with a benign abnormality. Musculoskeletal: No chest wall mass or suspicious bone lesions identified. IMPRESSION: 1. Enlarged right axillary and subpectoral lymph nodes are identified. In the setting of a right breast mass findings may represent metastatic adenopathy. These should be amendable to percutaneous tissue sampling under ultrasound guidance. 2. Diffuse liver metastasis 3. Tiny nodule within the lateral right apex is nonspecific measuring 3 mm. No follow-up needed if patient is low-risk. Non-contrast chest CT can be considered in 12 months if patient is high-risk. This recommendation follows the consensus statement: Guidelines for Management of Incidental Pulmonary Nodules Detected on CT Images: From the Fleischner Society 2017; Radiology 2017; 284:228-243. 4.  Aortic Atherosclerosis (ICD10-I70.0). Electronically Signed   By: Kerby Moors M.D.   On: 03/21/2018 12:28   Nm Bone Scan Whole Body  Result Date: 03/30/2018 CLINICAL DATA:  Breast cancer, liver metastases EXAM: NUCLEAR MEDICINE WHOLE BODY BONE SCAN TECHNIQUE: Whole body anterior and posterior images were obtained approximately 3 hours after intravenous injection of radiopharmaceutical. RADIOPHARMACEUTICALS:  21 mCi Technetium-83mMDP IV COMPARISON:  None Correlation: CT abdomen and pelvis 03/15/2018, CT chest 03/21/2018 FINDINGS: Uptake at the shoulders, typically degenerative. No additional sites of abnormal osseous tracer accumulation are identified to suggest osseous metastatic disease. Nonspecific increased tracer localization at both breasts. Additionally, areas of abnormal soft tissue localization of tracer is seen in the mid abdomen, likely within hepatic metastases as demonstrated on prior CT. No additional sites of abnormal soft tissue localization of tracer. Expected urinary tract localization of tracer seen. IMPRESSION: No scintigraphic evidence of osseous  metastatic disease. Abnormal soft tissue localization of tracer in the upper mid abdomen, likely within hepatic metastases as identified by recent CT. Electronically Signed  By: Lavonia Dana M.D.   On: 03/30/2018 02:47   US Abdomen Complete  Result Date: 03/09/2018 CLINICAL DATA:  Upper abdominal pain with nausea and vomiting. Weight loss. EXAM: ABDOMEN ULTRASOUND COMPLETE COMPARISON:  None. FINDINGS: Gallbladder: Within the gallbladder, there are echogenic foci which move and shadow consistent with cholelithiasis. Largest gallstone measures 1.9 cm in length. Gallbladder is mildly thickened without gallbladder edema or pericholecystic fluid. No sonographic Murphy sign noted by sonographer. Common bile duct: Diameter: 3 mm. No intrahepatic, common hepatic, or common bile duct dilatation. Liver: There are mass lesions throughout the liver involving all lobes in segments. Several of these mass lesions appear infiltrative; others appear more discrete. Largest individual mass is seen on the right measuring 3.6 x 2.1 x 3.7 cm. Note that the liver contour is somewhat lobular. Liver echogenicity is inhomogeneous. Portal vein is patent on color Doppler imaging with normal direction of blood flow towards the liver. IVC: No abnormality visualized. Pancreas: Visualized portion unremarkable. Portions of pancreas obscured by gas. Spleen: Size and appearance within normal limits. Right Kidney: Length: 8.5 cm. Echogenicity within normal limits. No mass or hydronephrosis visualized. Left Kidney: Length: 10.1 cm. Echogenicity within normal limits. No mass or hydronephrosis visualized. Abdominal aorta: No aneurysm visualized. Other findings: No demonstrable ascites. IMPRESSION: 1. Mass lesions throughout the liver, consistent with metastatic disease. Liver as a somewhat nodular contour suggesting underlying hepatic cirrhosis. Inhomogeneous echotexture to the liver. 2. Cholelithiasis with mild gallbladder wall thickening. A degree  of cholecystitis cannot be excluded by ultrasound. 3. Portions of pancreas obscured by gas. Visualized portions of pancreas appear normal. 4. Small right kidney. Etiology uncertain. This finding potentially may be indicative of renal artery stenosis. In this regard, question whether patient is hypertensive. These results will be called to the ordering clinician or representative by the Radiologist Assistant, and communication documented in the PACS or zVision Dashboard. Electronically Signed   By: Lowella Grip III M.D.   On: 03/09/2018 11:31   Ct Abdomen Pelvis W Contrast  Result Date: 03/15/2018 CLINICAL DATA:  Epigastric pain with nausea for several months. Weight loss. Liver mass on ultrasound. EXAM: CT ABDOMEN AND PELVIS WITH CONTRAST TECHNIQUE: Multidetector CT imaging of the abdomen and pelvis was performed using the standard protocol following bolus administration of intravenous contrast. CONTRAST:  147m ISOVUE-300 IOPAMIDOL (ISOVUE-300) INJECTION 61% COMPARISON:  03/09/2018 abdominal ultrasound. FINDINGS: Lower chest: Clear lung bases. Normal heart size without pericardial or pleural effusion. Distal esophageal wall thickening is mild on image 1/3. Relatively well-circumscribed lateral right breast 2.6 cm soft tissue density lesion on image 2/3. Hepatobiliary: Multiple bilateral liver masses, consistent with metastatic disease. Index right hepatic lobe lesion measures 3.3 x 3.2 cm on image 20/3. Lateral segment left liver lobe 5.9 x 4.6 cm lesion image 12/3. Normal gallbladder, without biliary ductal dilatation. Pancreas: Normal, without mass or ductal dilatation. Spleen: Normal in size, without focal abnormality. Adrenals/Urinary Tract: Minimal right adrenal nodularity. Normal left adrenal gland. Normal kidneys, without hydronephrosis. Normal urinary bladder. Stomach/Bowel: Normal stomach, without wall thickening. The distal sigmoid is underdistended. Possible wall thickening at the rectosigmoid  junction on 66/3. Normal terminal ileum and appendix. Normal small bowel. Vascular/Lymphatic: Normal caliber of the aorta and branch vessels. No abdominopelvic adenopathy. Reproductive: Retroverted uterus.  No adnexal mass. Other: No significant free fluid. No evidence of omental or peritoneal disease. Musculoskeletal: Disc bulges at L4-5 and L5-S1. IMPRESSION: 1. Widespread hepatic metastasis. 2. 2.6 cm lateral right breast soft tissue nodule could represent a breast  primary or an incidental benign lesion. Consider correlation with mammogram and ultrasound. 3. No definite source of primary malignancy identified within the abdomen or pelvis. There is possible rectosigmoid junction wall thickening. Consider colonoscopy with attention to this area. 4. Distal esophageal wall thickening, suggesting esophagitis. Electronically Signed   By: Abigail Miyamoto M.D.   On: 03/15/2018 16:00   US Biopsy (liver)  Result Date: 03/27/2018 CLINICAL DATA:  Breast carcinoma.  Liver lesions. EXAM: ULTRASOUND GUIDED CORE BIOPSY OF LIVER LESION MEDICATIONS: Intravenous Fentanyl and Versed were administered as conscious sedation during continuous monitoring of the patient's level of consciousness and physiological / cardiorespiratory status by the radiology RN, with a total moderate sedation time of 15 minutes. PROCEDURE: The procedure, risks, benefits, and alternatives were explained to the patient. Questions regarding the procedure were encouraged and answered. The patient understands and consents to the procedure. Survey ultrasound of the liver was performed. A representative lesion in the right lobe was localized, and an appropriate skin entry site was determined and marked. The operative field was prepped with chlorhexidine in a sterile fashion, and a sterile drape was applied covering the operative field. A sterile gown and sterile gloves were used for the procedure. Local anesthesia was provided with 1% Lidocaine. Under real-time  ultrasound guidance, a 17 gauge trocar needle was advanced to the margin of the lesion. Once needle tip position was confirmed, coaxial 18-gauge core biopsy samples were obtained, submitted in formalin to surgical pathology. The guide needle was removed. Postprocedure scans show no hemorrhage or other apparent complication. The patient tolerated the procedure well. COMPLICATIONS: None. FINDINGS: Multiple liver lesions are identified corresponding to the lesions seen on prior CT. Representative core biopsies obtained as above. IMPRESSION: 1. Technically successful ultrasound-guided core biopsy, liver lesion Electronically Signed   By: Lucrezia Europe M.D.   On: 03/27/2018 09:50   US Breast Ltd Uni Left Inc Axilla  Result Date: 03/22/2018 CLINICAL DATA:  62 year old female with palpable RIGHT breast mass discovered on clinical examination. EXAM: DIGITAL DIAGNOSTIC BILATERAL MAMMOGRAM WITH CAD AND TOMO ULTRASOUND BILATERAL BREAST COMPARISON:  None ACR Breast Density Category b: There are scattered areas of fibroglandular density. FINDINGS: 2D/3D full field views of both breasts demonstrate the following: A circumscribed oval mass within the UPPER-OUTER RIGHT breast. A spiculated mass containing calcifications within the UPPER-OUTER RIGHT breast. Mass and calcifications span a distance of 4 cm. Two 6 mm partially circumscribed partially obscured masses within the LEFT breast, 1 within the UPPER-OUTER LEFT breast and 1 within the INNER LEFT breast. Mammographic images were processed with CAD. On physical exam, a firm palpable mass is identified at the 9 o'clock position of the RIGHT breast 7 cm from the nipple. Targeted ultrasound is performed, showing the following: RIGHT breast and axilla: A 1.4 x 1.6 x 2.3 cm irregular hypoechoic mass at the 11:30 position 2 cm from the nipple. A 2.1 x 1.6 x 2.5 cm hypoechoic solid mass at the 9 o'clock position 7 cm from the nipple, corresponding to the patient's palpable abnormality  and may represent an enlarged abnormal intramammary lymph node. Five enlarged RIGHT axillary lymph nodes with thickened cortices. LEFT breast and axilla: A 0.7 x 0.4 x 0.6 cm circumscribed oval hypoechoic parallel mass at the 9 o'clock position 3 cm from the nipple. A 0.9 x 0.3 x 0.7 cm circumscribed oval hypoechoic parallel mass at the 2 o'clock position 3 cm from the nipple. No abnormal LEFT axillary lymph nodes are identified. IMPRESSION: 1. Highly suspicious 2.3  cm UPPER-OUTER RIGHT breast mass (entire area measuring 4 cm mammographically with associated calcifications), 2.5 cm solid mass/abnormal intraparenchymal lymph node within the OUTER RIGHT breast and 5 abnormal RIGHT axillary lymph nodes. Tissue sampling recommended. 2. Indeterminate 0.7 cm INNER LEFT breast mass and 0.9 cm UPPER-OUTER LEFT breast mass. Given RIGHT breast findings, tissue sampling recommended. No abnormal LEFT axillary lymph nodes. RECOMMENDATION: 1. Three ultrasound-guided biopsies of the RIGHT breast - UPPER-OUTER RIGHT breast mass, OUTER RIGHT breast mass and 1 of the enlarged RIGHT axillary lymph nodes. 2. Two ultrasound-guided biopsies of the LEFT breast - masses as described. These biopsies will be arranged. I have discussed the findings and recommendations with the patient. Results were also provided in writing at the conclusion of the visit. If applicable, a reminder letter will be sent to the patient regarding the next appointment. BI-RADS CATEGORY  5: Highly suggestive of malignancy. Electronically Signed   By: Margarette Canada M.D.   On: 03/22/2018 12:11   US Breast Ltd Uni Right Inc Axilla  Result Date: 03/22/2018 CLINICAL DATA:  62 year old female with palpable RIGHT breast mass discovered on clinical examination. EXAM: DIGITAL DIAGNOSTIC BILATERAL MAMMOGRAM WITH CAD AND TOMO ULTRASOUND BILATERAL BREAST COMPARISON:  None ACR Breast Density Category b: There are scattered areas of fibroglandular density. FINDINGS: 2D/3D full  field views of both breasts demonstrate the following: A circumscribed oval mass within the UPPER-OUTER RIGHT breast. A spiculated mass containing calcifications within the UPPER-OUTER RIGHT breast. Mass and calcifications span a distance of 4 cm. Two 6 mm partially circumscribed partially obscured masses within the LEFT breast, 1 within the UPPER-OUTER LEFT breast and 1 within the INNER LEFT breast. Mammographic images were processed with CAD. On physical exam, a firm palpable mass is identified at the 9 o'clock position of the RIGHT breast 7 cm from the nipple. Targeted ultrasound is performed, showing the following: RIGHT breast and axilla: A 1.4 x 1.6 x 2.3 cm irregular hypoechoic mass at the 11:30 position 2 cm from the nipple. A 2.1 x 1.6 x 2.5 cm hypoechoic solid mass at the 9 o'clock position 7 cm from the nipple, corresponding to the patient's palpable abnormality and may represent an enlarged abnormal intramammary lymph node. Five enlarged RIGHT axillary lymph nodes with thickened cortices. LEFT breast and axilla: A 0.7 x 0.4 x 0.6 cm circumscribed oval hypoechoic parallel mass at the 9 o'clock position 3 cm from the nipple. A 0.9 x 0.3 x 0.7 cm circumscribed oval hypoechoic parallel mass at the 2 o'clock position 3 cm from the nipple. No abnormal LEFT axillary lymph nodes are identified. IMPRESSION: 1. Highly suspicious 2.3 cm UPPER-OUTER RIGHT breast mass (entire area measuring 4 cm mammographically with associated calcifications), 2.5 cm solid mass/abnormal intraparenchymal lymph node within the OUTER RIGHT breast and 5 abnormal RIGHT axillary lymph nodes. Tissue sampling recommended. 2. Indeterminate 0.7 cm INNER LEFT breast mass and 0.9 cm UPPER-OUTER LEFT breast mass. Given RIGHT breast findings, tissue sampling recommended. No abnormal LEFT axillary lymph nodes. RECOMMENDATION: 1. Three ultrasound-guided biopsies of the RIGHT breast - UPPER-OUTER RIGHT breast mass, OUTER RIGHT breast mass and 1 of  the enlarged RIGHT axillary lymph nodes. 2. Two ultrasound-guided biopsies of the LEFT breast - masses as described. These biopsies will be arranged. I have discussed the findings and recommendations with the patient. Results were also provided in writing at the conclusion of the visit. If applicable, a reminder letter will be sent to the patient regarding the next appointment. BI-RADS CATEGORY  5:  Highly suggestive of malignancy. Electronically Signed   By: Margarette Canada M.D.   On: 03/22/2018 12:11   Mm Diag Breast Tomo Bilateral  Result Date: 03/22/2018 CLINICAL DATA:  62 year old female with palpable RIGHT breast mass discovered on clinical examination. EXAM: DIGITAL DIAGNOSTIC BILATERAL MAMMOGRAM WITH CAD AND TOMO ULTRASOUND BILATERAL BREAST COMPARISON:  None ACR Breast Density Category b: There are scattered areas of fibroglandular density. FINDINGS: 2D/3D full field views of both breasts demonstrate the following: A circumscribed oval mass within the UPPER-OUTER RIGHT breast. A spiculated mass containing calcifications within the UPPER-OUTER RIGHT breast. Mass and calcifications span a distance of 4 cm. Two 6 mm partially circumscribed partially obscured masses within the LEFT breast, 1 within the UPPER-OUTER LEFT breast and 1 within the INNER LEFT breast. Mammographic images were processed with CAD. On physical exam, a firm palpable mass is identified at the 9 o'clock position of the RIGHT breast 7 cm from the nipple. Targeted ultrasound is performed, showing the following: RIGHT breast and axilla: A 1.4 x 1.6 x 2.3 cm irregular hypoechoic mass at the 11:30 position 2 cm from the nipple. A 2.1 x 1.6 x 2.5 cm hypoechoic solid mass at the 9 o'clock position 7 cm from the nipple, corresponding to the patient's palpable abnormality and may represent an enlarged abnormal intramammary lymph node. Five enlarged RIGHT axillary lymph nodes with thickened cortices. LEFT breast and axilla: A 0.7 x 0.4 x 0.6 cm  circumscribed oval hypoechoic parallel mass at the 9 o'clock position 3 cm from the nipple. A 0.9 x 0.3 x 0.7 cm circumscribed oval hypoechoic parallel mass at the 2 o'clock position 3 cm from the nipple. No abnormal LEFT axillary lymph nodes are identified. IMPRESSION: 1. Highly suspicious 2.3 cm UPPER-OUTER RIGHT breast mass (entire area measuring 4 cm mammographically with associated calcifications), 2.5 cm solid mass/abnormal intraparenchymal lymph node within the OUTER RIGHT breast and 5 abnormal RIGHT axillary lymph nodes. Tissue sampling recommended. 2. Indeterminate 0.7 cm INNER LEFT breast mass and 0.9 cm UPPER-OUTER LEFT breast mass. Given RIGHT breast findings, tissue sampling recommended. No abnormal LEFT axillary lymph nodes. RECOMMENDATION: 1. Three ultrasound-guided biopsies of the RIGHT breast - UPPER-OUTER RIGHT breast mass, OUTER RIGHT breast mass and 1 of the enlarged RIGHT axillary lymph nodes. 2. Two ultrasound-guided biopsies of the LEFT breast - masses as described. These biopsies will be arranged. I have discussed the findings and recommendations with the patient. Results were also provided in writing at the conclusion of the visit. If applicable, a reminder letter will be sent to the patient regarding the next appointment. BI-RADS CATEGORY  5: Highly suggestive of malignancy. Electronically Signed   By: Margarette Canada M.D.   On: 03/22/2018 12:11   [place] Axillary Node Core Biopsy Right  Addendum Date: 03/27/2018   ADDENDUM REPORT: 03/27/2018 15:06 ADDENDUM: Pathology revealed GRADE II INVASIVE DUCTAL CARCINOMA, DUCTAL CARCINOMA IN SITU of the RIGHT breast, 11:30 o'clock, 2 cm from nipple. This was found to be concordant by Dr. Peggye Fothergill. 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 This was found to be concordant by Dr. Peggye Fothergill. 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 This was found to be concordant by Dr. Peggye Fothergill.  Pathology results were discussed with the patient by telephone. The patient reported doing well after the biopsies with tenderness at the sites. Post biopsy instructions and care were reviewed and questions were answered. The patient was encouraged to call [facility] for any additional concerns. Pathology results were discussed with Dr. [name] on March 27, 2018. Dr. Burr Medico stated she would arrange a surgical referral for the patient at the appropriate time. The patient has an appointment to see Dr. Burr Medico on March 30, 2018. The patient underwent ultrasound-guided core needle biopsy of the liver on March 28, 2018 at [facility], and she is scheduled for ultrasound guided biopsy of 2 LEFT breast masses on March 29, 2018 at [facility]. Pathology results reported by Terie Purser, RN on 03/27/2018. Electronically Signed   By: Evangeline Dakin M.D.   On: 03/27/2018 15:06   Result Date: 03/27/2018 CLINICAL DATA:  62 year old who presented with a palpable lump in the OUTER RIGHT breast which was shown on diagnostic workup to represent an approximate 2.5 cm suspicious mass at the 9 o'clock position approximately 7 cm from the nipple. Diagnostic workup demonstrated a second highly suspicious 2.3 cm mass at the 11:30 o'clock position approximately 2 cm from the nipple associated with architectural distortion. A total of 5 abnormal lymph nodes were identified in the RIGHT axilla on the diagnostic ultrasound. Biopsy of the 2 breast masses and a RIGHT axillary node is performed. Of note, a recent CT of the abdomen and pelvis demonstrated innumerable liver metastases and a possible mass involving the rectum. EXAM: ULTRASOUND GUIDED RIGHT BREAST CORE NEEDLE BIOPSY x 2 ULTRASOUND GUIDED RIGHT AXILLARY NODE CORE NEEDLE BIOPSY COMPARISON:  Previous exam(s). FINDINGS: I met with the patient and we discussed the procedure of ultrasound-guided biopsy, including benefits and  alternatives. We discussed the high likelihood of a successful procedure. We discussed the risks of the procedure, including infection, bleeding, tissue injury, clip migration, and inadequate sampling. Informed written consent was given. The usual time-out protocol was performed immediately prior to the procedure. Lesion quadrant (labeled # 1 for pathology, 11:30 o'clock position approximately 2 cm from the nipple): UPPER OUTER QUADRANT. Using sterile technique with chlorhexidine as skin antisepsis, 1% lidocaine and 1% lidocaine with epinephrine as local anesthetic, under direct ultrasound visualization, a 12 gauge Bard Marquee core needle device was used to initially perform biopsy of the suspicious mass in the UPPER OUTER QUADRANT at the 11:30 o'clock position approximately 2 cm from the nipple using a LATERAL approach. At the conclusion of the procedure a ribbon shaped tissue marker clip was deployed into the biopsy cavity. Lesion quadrant (labeled # 2 for pathology, 9 o'clock position approximately 7 cm from the nipple): Slight LOWER OUTER QUADRANT. Subsequently, using sterile technique with chlorhexidine as skin antisepsis, 1% lidocaine and 1% lidocaine with epinephrine as local anesthetic, under direct ultrasound visualization, a 12 gauge Bard Marquee core needle device was used to perform biopsy of the palpable mass in the OUTER RIGHT breast at the 9 o'clock position approximately 7 cm from the nipple using a LATERAL approach. At the conclusion of the procedure a coil shaped tissue marker clip was deployed into the biopsy cavity. Finally, using sterile technique with chlorhexidine as skin antisepsis, 1% lidocaine and 1% lidocaine with epinephrine as local anesthetic, under direct ultrasound visualization, a 14 gauge Bard Marquee core needle device was used to perform biopsy of 1 of the pathologic RIGHT axillary lymph nodes using a LATERAL approach. At the conclusion of the procedure a spiral shaped HydroMark  tissue marker clip was deployed into the biopsy cavity. The RIGHT axilla biopsy samples are labeled # 3 for pathology. Follow-up two-view mammogram was performed and is dictated separately.  IMPRESSION: Ultrasound guided biopsy of 2 suspicious masses involving the RIGHT breast and a pathologic RIGHT axillary lymph node. No apparent complications. Electronically Signed: By: Evangeline Dakin M.D. On: 03/24/2018 16:31   Mm Clip Placement Left  Result Date: 03/30/2018 CLINICAL DATA:  Post biopsy mammogram of the left breast for clip placement. EXAM: DIAGNOSTIC LEFT MAMMOGRAM POST STEREOTACTIC BIOPSY COMPARISON:  Previous exam(s). FINDINGS: Mammographic images were obtained following stereotactic guided biopsy of a mass in the upper-outer left breast. The X shaped biopsy marking clip is well positioned at the site of biopsy in the upper-outer left breast. IMPRESSION: Appropriate positioning of the X shaped biopsy marking clip in the upper-outer left breast. Final Assessment: Post Procedure Mammograms for Marker Placement Electronically Signed   By: Ammie Ferrier M.D.   On: 03/30/2018 10:43   Mm Clip Placement Left  Result Date: 03/28/2018 CLINICAL DATA:  Patient status post ultrasound-guided core needle biopsy sonographically detected left breast masses 2 o'clock and 9 o'clock position. EXAM: DIAGNOSTIC LEFT MAMMOGRAM POST ULTRASOUND BIOPSY COMPARISON:  Previous exam(s). FINDINGS: Mammographic images were obtained following ultrasound guided biopsy of left breast masses 2 o'clock and 9 o'clock position. Left breast mass 9 o'clock position: Ribbon shaped marking clip: In appropriate position. Left breast mass 2 o'clock position: Coil shaped marking clip: In appropriate position. Note that the mammographically identified mass within the lateral posterior left breast does not correspond with the sonographically identified and biopsied mass within the left breast 2 o'clock position. IMPRESSION: Biopsy marking  clips as above. Note that the mammographically identified mass within the lateral left breast does not correspond with the sonographically identified and biopsied mass within the left breast 2 o'clock position. Patient will need additional stereotactic guided core needle biopsy of the mammographically identified mass within the lateral aspect of the left breast posterior depth. Patient will be scheduled for this procedure. This was discussed with the patient after the biopsy. Final Assessment: Post Procedure Mammograms for Marker Placement Electronically Signed   By: Lovey Newcomer M.D.   On: 03/28/2018 15:58   Mm Clip Placement Right  Result Date: 03/24/2018 CLINICAL DATA:  Confirmation of clip placement after ultrasound-guided core needle biopsy of 2 suspicious masses in the RIGHT breast and a pathologic RIGHT axillary lymph node. EXAM: DIAGNOSTIC RIGHT MAMMOGRAM POST ULTRASOUND BIOPSY COMPARISON:  Previous exam(s). FINDINGS: Mammographic images were obtained following ultrasound-guided core needle biopsy of 2 masses involving the RIGHT breast and a pathologic RIGHT axillary lymph node. The ribbon shaped tissue marker clip is appropriately positioned within the highly suspicious mass associated with architectural distortion involving the UPPER OUTER QUADRANT at MIDDLE depth at the 11:30 o'clock position. The LATERAL extent of the adjacent suspicious calcifications associated with this mass are approximately 2.5 cm LATERAL to the ribbon shaped clip. The coil shaped tissue marker clip is appropriately position within the suspicious palpable mass in the OUTER LEFT breast at MIDDLE depth at the 9 o'clock position. The spiral shaped HydroMark clip placed into the RIGHT axillary lymph node is not visible due to the depth of the lymph node. The distance between the ribbon clip and the coil clip is approximately 6.3 cm. The masses nearly overlap each another on the mediolateral and MLO views. Expected post biopsy changes  are present without evidence of hematoma. IMPRESSION: 1. Appropriate positioning of the ribbon shaped tissue marker clip within the mass associated with architectural distortion and suspicious calcifications in the UPPER OUTER QUADRANT of the RIGHT breast at MIDDLE depth, 11:30 o'clock position  2 cm from the nipple. The calcifications extend approximately 2.5 cm LATERAL to the ribbon clip. 2. Appropriate positioning of the coil shaped tissue marker clip within the palpable mass in the OUTER RIGHT breast at MIDDLE depth, 9 o'clock position 7 cm from the nipple. 3. The distance between the ribbon clip in the coil clip is approximately 6.3 cm. Final Assessment: Post Procedure Mammograms for Marker Placement Electronically Signed   By: Evangeline Dakin M.D.   On: 03/24/2018 16:38   [place] Lt Breast Bx W Loc Dev 1st Lesion Img Bx Spec US Guide  Addendum Date: 03/30/2018   ADDENDUM REPORT: 03/29/2018 14:17 ADDENDUM: Pathology revealed FIBROCYSTIC CHANGES of the Left breast, 9 o'clock. FIBROADENOMA of the Left breast, 2 o'clock. This was found to be concordant by Dr. Lovey Newcomer. Pathology results were discussed with the patient by telephone. The patient reported doing well after the biopsies with tenderness at the sites. Post biopsy instructions and care were reviewed and questions were answered. The patient was encouraged to call [facility] for any additional concerns. The patient is scheduled for a Left breast stereotatic biopsy at the [facility] on March 30, 2018. The patient has a recent diagnosis of right breast cancer and should follow her outlined treatment plan. Pathology results reported by Terie Purser, RN on 03/29/2018. Electronically Signed   By: Lovey Newcomer M.D.   On: 03/29/2018 14:17   Result Date: 03/30/2018 CLINICAL DATA:  Patient with indeterminate left breast mass 9 o'clock position. EXAM: ULTRASOUND GUIDED LEFT BREAST CORE NEEDLE BIOPSY COMPARISON:  Previous exam(s).  FINDINGS: I met with the patient and we discussed the procedure of ultrasound-guided biopsy, including benefits and alternatives. We discussed the high likelihood of a successful procedure. We discussed the risks of the procedure, including infection, bleeding, tissue injury, clip migration, and inadequate sampling. Informed written consent was given. The usual time-out protocol was performed immediately prior to the procedure. Lesion quadrant: Upper inner quadrant Using sterile technique and 1% Lidocaine as local anesthetic, under direct ultrasound visualization, a 12 gauge spring-loaded device was used to perform biopsy of left breast mass 9 o'clock position using a medial approach. At the conclusion of the procedure a ribbon shaped tissue marker clip was deployed into the biopsy cavity. Follow up 2 view mammogram was performed and dictated separately. IMPRESSION: Ultrasound guided biopsy of left breast mass 9 o'clock position. No apparent complications. Electronically Signed: By: Lovey Newcomer M.D. On: 03/28/2018 15:54   [place] Lt Breast Bx W Loc Dev Ea Add Lesion Img Bx Spec US Guide  Addendum Date: 03/30/2018   ADDENDUM REPORT: 03/29/2018 14:17 ADDENDUM: Pathology revealed FIBROCYSTIC CHANGES of the Left breast, 9 o'clock. FIBROADENOMA of the Left breast, 2 o'clock. This was found to be concordant by Dr. Lovey Newcomer. Pathology results were discussed with the patient by telephone. The patient reported doing well after the biopsies with tenderness at the sites. Post biopsy instructions and care were reviewed and questions were answered. The patient was encouraged to call [facility] for any additional concerns. The patient is scheduled for a Left breast stereotatic biopsy at the [facility] on March 30, 2018. The patient has a recent diagnosis of right breast cancer and should follow her outlined treatment plan. Pathology results reported by Terie Purser, RN on 03/29/2018.  Electronically Signed   By: Lovey Newcomer M.D.   On: 03/29/2018 14:17   Result Date: 03/30/2018 CLINICAL DATA:  Indeterminate left breast mass  2 o'clock position. EXAM: ULTRASOUND GUIDED LEFT BREAST CORE NEEDLE BIOPSY COMPARISON:  Previous exam(s). FINDINGS: I met with the patient and we discussed the procedure of ultrasound-guided biopsy, including benefits and alternatives. We discussed the high likelihood of a successful procedure. We discussed the risks of the procedure, including infection, bleeding, tissue injury, clip migration, and inadequate sampling. Informed written consent was given. The usual time-out protocol was performed immediately prior to the procedure. Lesion quadrant: Upper outer quadrant Using sterile technique and 1% Lidocaine as local anesthetic, under direct ultrasound visualization, a 12 gauge spring-loaded device was used to perform biopsy of left breast mass 2 o'clock position using a lateral approach. At the conclusion of the procedure a coil shaped tissue marker clip was deployed into the biopsy cavity. Follow up 2 view mammogram was performed and dictated separately. IMPRESSION: Ultrasound guided biopsy of left breast mass 2 o'clock position. No apparent complications. Electronically Signed: By: Lovey Newcomer M.D. On: 03/28/2018 15:55   [place] Rt Breast Bx W Loc Dev 1st Lesion Img Bx Spec US Guide  Addendum Date: 03/27/2018   ADDENDUM REPORT: 03/27/2018 15:06 ADDENDUM: Pathology revealed GRADE II INVASIVE DUCTAL CARCINOMA, DUCTAL CARCINOMA IN SITU of the RIGHT breast, 11:30 o'clock, 2 cm from nipple. This was found to be concordant by Dr. Peggye Fothergill. 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 This was found to be concordant by Dr. Peggye Fothergill. 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 This was found to be concordant by Dr. Peggye Fothergill. Pathology results were discussed with the patient by telephone. The patient reported  doing well after the biopsies with tenderness at the sites. Post biopsy instructions and care were reviewed and questions were answered. The patient was encouraged to call [facility] for any additional concerns. Pathology results were discussed with Dr. [name] on March 27, 2018. Dr. Burr Medico stated she would arrange a surgical referral for the patient at the appropriate time. The patient has an appointment to see Dr. Burr Medico on March 30, 2018. The patient underwent ultrasound-guided core needle biopsy of the liver on March 28, 2018 at [facility], and she is scheduled for ultrasound guided biopsy of 2 LEFT breast masses on March 29, 2018 at [facility]. Pathology results reported by Terie Purser, RN on 03/27/2018. Electronically Signed   By: Evangeline Dakin M.D.   On: 03/27/2018 15:06   Result Date: 03/27/2018 CLINICAL DATA:  62 year old who presented with a palpable lump in the OUTER RIGHT breast which was shown on diagnostic workup to represent an approximate 2.5 cm suspicious mass at the 9 o'clock position approximately 7 cm from the nipple. Diagnostic workup demonstrated a second highly suspicious 2.3 cm mass at the 11:30 o'clock position approximately 2 cm from the nipple associated with architectural distortion. A total of 5 abnormal lymph nodes were identified in the RIGHT axilla on the diagnostic ultrasound. Biopsy of the 2 breast masses and a RIGHT axillary node is performed. Of note, a recent CT of the abdomen and pelvis demonstrated innumerable liver metastases and a possible mass involving the rectum. EXAM: ULTRASOUND GUIDED RIGHT BREAST CORE NEEDLE BIOPSY x 2 ULTRASOUND GUIDED RIGHT AXILLARY NODE CORE NEEDLE BIOPSY COMPARISON:  Previous exam(s). FINDINGS: I met with the patient and we discussed the procedure of ultrasound-guided biopsy, including benefits and alternatives. We discussed the high likelihood of a successful procedure. We discussed the  risks of the procedure, including infection,  bleeding, tissue injury, clip migration, and inadequate sampling. Informed written consent was given. The usual time-out protocol was performed immediately prior to the procedure. Lesion quadrant (labeled # 1 for pathology, 11:30 o'clock position approximately 2 cm from the nipple): UPPER OUTER QUADRANT. Using sterile technique with chlorhexidine as skin antisepsis, 1% lidocaine and 1% lidocaine with epinephrine as local anesthetic, under direct ultrasound visualization, a 12 gauge Bard Marquee core needle device was used to initially perform biopsy of the suspicious mass in the UPPER OUTER QUADRANT at the 11:30 o'clock position approximately 2 cm from the nipple using a LATERAL approach. At the conclusion of the procedure a ribbon shaped tissue marker clip was deployed into the biopsy cavity. Lesion quadrant (labeled # 2 for pathology, 9 o'clock position approximately 7 cm from the nipple): Slight LOWER OUTER QUADRANT. Subsequently, using sterile technique with chlorhexidine as skin antisepsis, 1% lidocaine and 1% lidocaine with epinephrine as local anesthetic, under direct ultrasound visualization, a 12 gauge Bard Marquee core needle device was used to perform biopsy of the palpable mass in the OUTER RIGHT breast at the 9 o'clock position approximately 7 cm from the nipple using a LATERAL approach. At the conclusion of the procedure a coil shaped tissue marker clip was deployed into the biopsy cavity. Finally, using sterile technique with chlorhexidine as skin antisepsis, 1% lidocaine and 1% lidocaine with epinephrine as local anesthetic, under direct ultrasound visualization, a 14 gauge Bard Marquee core needle device was used to perform biopsy of 1 of the pathologic RIGHT axillary lymph nodes using a LATERAL approach. At the conclusion of the procedure a spiral shaped HydroMark tissue marker clip was deployed into the biopsy cavity. The RIGHT axilla biopsy samples are  labeled # 3 for pathology. Follow-up two-view mammogram was performed and is dictated separately. IMPRESSION: Ultrasound guided biopsy of 2 suspicious masses involving the RIGHT breast and a pathologic RIGHT axillary lymph node. No apparent complications. Electronically Signed: By: Evangeline Dakin M.D. On: 03/24/2018 16:31   [place] Rt Breast Bx W Loc Dev Ea Add Lesion Img Bx Spec US Guide  Addendum Date: 03/27/2018   ADDENDUM REPORT: 03/27/2018 15:06 ADDENDUM: Pathology revealed GRADE II INVASIVE DUCTAL CARCINOMA, DUCTAL CARCINOMA IN SITU of the RIGHT breast, 11:30 o'clock, 2 cm from nipple. This was found to be concordant by Dr. Peggye Fothergill. 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 This was found to be concordant by Dr. Peggye Fothergill. 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 This was found to be concordant by Dr. Peggye Fothergill. Pathology results were discussed with the patient by telephone. The patient reported doing well after the biopsies with tenderness at the sites. Post biopsy instructions and care were reviewed and questions were answered. The patient was encouraged to call [facility] for any additional concerns. Pathology results were discussed with Dr. [name] on March 27, 2018. Dr. Burr Medico stated she would arrange a surgical referral for the patient at the appropriate time. The patient has an appointment to see Dr. Burr Medico on March 30, 2018. The patient underwent ultrasound-guided core needle biopsy of the liver on March 28, 2018 at [facility], and she is scheduled for ultrasound guided biopsy of 2 LEFT breast masses on March 29, 2018 at [facility]. Pathology results reported by Terie Purser, RN on 03/27/2018. Electronically Signed   By: Evangeline Dakin M.D.   On: 03/27/2018 15:06   Result Date: 03/27/2018 CLINICAL  DATA:  62 year old who presented with a palpable lump in the OUTER RIGHT  breast which was shown on diagnostic workup to represent an approximate 2.5 cm suspicious mass at the 9 o'clock position approximately 7 cm from the nipple. Diagnostic workup demonstrated a second highly suspicious 2.3 cm mass at the 11:30 o'clock position approximately 2 cm from the nipple associated with architectural distortion. A total of 5 abnormal lymph nodes were identified in the RIGHT axilla on the diagnostic ultrasound. Biopsy of the 2 breast masses and a RIGHT axillary node is performed. Of note, a recent CT of the abdomen and pelvis demonstrated innumerable liver metastases and a possible mass involving the rectum. EXAM: ULTRASOUND GUIDED RIGHT BREAST CORE NEEDLE BIOPSY x 2 ULTRASOUND GUIDED RIGHT AXILLARY NODE CORE NEEDLE BIOPSY COMPARISON:  Previous exam(s). FINDINGS: I met with the patient and we discussed the procedure of ultrasound-guided biopsy, including benefits and alternatives. We discussed the high likelihood of a successful procedure. We discussed the risks of the procedure, including infection, bleeding, tissue injury, clip migration, and inadequate sampling. Informed written consent was given. The usual time-out protocol was performed immediately prior to the procedure. Lesion quadrant (labeled # 1 for pathology, 11:30 o'clock position approximately 2 cm from the nipple): UPPER OUTER QUADRANT. Using sterile technique with chlorhexidine as skin antisepsis, 1% lidocaine and 1% lidocaine with epinephrine as local anesthetic, under direct ultrasound visualization, a 12 gauge Bard Marquee core needle device was used to initially perform biopsy of the suspicious mass in the UPPER OUTER QUADRANT at the 11:30 o'clock position approximately 2 cm from the nipple using a LATERAL approach. At the conclusion of the procedure a ribbon shaped tissue marker clip was deployed into the biopsy cavity. Lesion quadrant (labeled # 2 for pathology, 9 o'clock position approximately 7 cm from the nipple): Slight  LOWER OUTER QUADRANT. Subsequently, using sterile technique with chlorhexidine as skin antisepsis, 1% lidocaine and 1% lidocaine with epinephrine as local anesthetic, under direct ultrasound visualization, a 12 gauge Bard Marquee core needle device was used to perform biopsy of the palpable mass in the OUTER RIGHT breast at the 9 o'clock position approximately 7 cm from the nipple using a LATERAL approach. At the conclusion of the procedure a coil shaped tissue marker clip was deployed into the biopsy cavity. Finally, using sterile technique with chlorhexidine as skin antisepsis, 1% lidocaine and 1% lidocaine with epinephrine as local anesthetic, under direct ultrasound visualization, a 14 gauge Bard Marquee core needle device was used to perform biopsy of 1 of the pathologic RIGHT axillary lymph nodes using a LATERAL approach. At the conclusion of the procedure a spiral shaped HydroMark tissue marker clip was deployed into the biopsy cavity. The RIGHT axilla biopsy samples are labeled # 3 for pathology. Follow-up two-view mammogram was performed and is dictated separately. IMPRESSION: Ultrasound guided biopsy of 2 suspicious masses involving the RIGHT breast and a pathologic RIGHT axillary lymph node. No apparent complications. Electronically Signed: By: Evangeline Dakin M.D. On: 03/24/2018 16:31    ASSESSMENT & PLAN:  LOUETTA HOLLINGSHEAD is a 62 y.o. female with history of HTN, GERD  1. 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  She presented with fatigue, anorexia, upper abdominal and back pain and weight loss.  Exam showed a palpable right breast mass, and hepatomegaly with tenderness.   -I have reviewed her recent CT, bone scan and mammogram findings.  She has 2 right breast  mass, right axillary adenopathy, and a diffuse liver metastasis.  All 3 biopsy showed invasive ductal carcinoma, ER PR positive,  breast mass were HER-2 positive, liver biopsy were HER-2 negative.  I discussed the above findings in great details with patient and her family members, copy of the results were given to the patient today. -We discussed natural history of metastatic breast cancer, unfortunately this is incurable disease, due to her diffuse liver metastasis.  We discussed her that HER-2 positive disease are also more aggressive. -I recommend her to start systemic chemotherapy with weekly Taxol, and dual antibody Herceptin and pejeta every 3 weeks, for disease control. --Chemotherapy consent: Side effects including but does not not limited to, fatigue, nausea, vomiting, diarrhea, hair loss, neuropathy, fluid retention, renal and kidney dysfunction, neutropenic fever, needed for blood transfusion, bleeding, skin rash, reversible cardiomyopathy, were discussed with patient in great detail. She agrees to proceed. -Goal of therapy is palliative  -we also discussed other treatment options, such as combined chemotherapy, antiestrogen therapy, other chemo regiment.  Due to the diffuse metastatic disease, I recommend her Taxol, Herceptin and pejeta as first-line treatment for about 6 months, if she has great response, will switch to maintenance Herceptin and pejeta, and start aromatase inhibitor. --I discussed the importance of Cardiology follow-up and Echocardiograms with Herceptin treatment due to possibility of heart failure.  -I discussed attending chemo class and then deciding on a chemotherapy regiment. I also discussed anti-estrogen therapy after chemotherapy.  -I previously gave mirazapine for loss of appetite. She has not started. I also advised her to eat healthy food and discussed talking to a dietician.  - She states that she is not taking Tramadol because it makes her drowsy.  I recommend her to try half tablets as needed --We will start chemo after chemo class and echocardiogram -f/u one week after starting  chemo -Port placement in 1 to 2 weeks -will screen her for the NRG BR004 trial    2. Upper abdominal and back pain -She has tried Tylenol and ibuprofen, did not help her much -I previously prescribed her tramadol 50 mg every 6 hours as needed for pain.  Constipation and management reviewed with her.  She has not been taking much due to the drowsiness. -I recommend her to try half tablets every 6 hours as needed  3. Anorexia and weight loss  -Secondary to metastatic cancer.   -I have previously prescribed mirtazapine, she has not started, but agrees to try  -Dietitian consult -I encouraged her to try nutritional supplement.  4. Social support  -She lives alone, has her sister and niece in [place].  Her daughter lives in a few hours away  -SW referral  -I reordered her letter for her to be off work due to the cancer diagnosis and chemo treatment -I will fill out her daughter's FMLA paperwork  5. Goal of care discussion  -We again discussed the incurable nature of her cancer, and the overall poor prognosis, especially if she does not have good response to chemotherapy or progress on chemo -The patient understands the goal of care is palliative. -she is full code    Plan: -I will schedule chemo class and echocardiogram within the next week  -Will start chemo next week with weekly Taxol and Herceptin/Perjeta every 3 weeks - f/u one week after first chemo treatment -Dietician referral  -I will write a letter for her taking time off from work, and fill out her daughter's FMLA paper work  -clinical trial screening  Orders Placed This Encounter  Procedures  . IR Perc Tun Perit Cath W/Port    Standing Status:   Future    Standing Expiration Date:   05/31/2019    Order Specific Question:   Reason for exam:    Answer:   chemo    Order Specific Question:   Preferred Imaging Location?    Answer:   [facility]  . Ambulatory referral to Cardiology    Referral Priority:    Routine    Referral Type:   Consultation    Referral Reason:   Specialty Services Required    Requested Specialty:   Cardiology    Number of Visits Requested:   1  . ECHOCARDIOGRAM COMPLETE    Standing Status:   Future    Standing Expiration Date:   06/30/2019    Scheduling Instructions:     Before chemo and Herceptin    Order Specific Question:   Where should this test be performed    Answer:   [facility])    Order Specific Question:   Does the patient weigh less than or greater than 250 lbs?    Answer:   Patient weighs less than 250 lbs    Order Specific Question:   Perflutren DEFINITY (image enhancing agent) should be administered unless hypersensitivity or allergy exist    Answer:   Administer Perflutren    All questions were answered. The patient knows to call the clinic with any problems, questions or concerns. I spent 35 minutes counseling the patient face to face. The total time spent in the appointment was 50 minutes and more than 50% was on counseling.  Dierdre Searles Dweik am acting as scribe for Dr. [name].  I have reviewed the above documentation for accuracy and completeness, and I agree with the above.      [name], MD 03/30/2018

## 2018-03-29 ENCOUNTER — Encounter (HOSPITAL_COMMUNITY)
Admission: RE | Admit: 2018-03-29 | Discharge: 2018-03-29 | Disposition: A | Discharge status: Home / Self Care | Payer: 59 | Source: Ambulatory Visit | Attending: Hematology | Admitting: Hematology

## 2018-03-29 ENCOUNTER — Other Ambulatory Visit: Payer: Self-pay | Admitting: Adult Health

## 2018-03-29 DIAGNOSIS — N6320 Unspecified lump in the left breast, unspecified quadrant: Secondary | ICD-10-CM

## 2018-03-29 DIAGNOSIS — C787 Secondary malignant neoplasm of liver and intrahepatic bile duct: Secondary | ICD-10-CM | POA: Diagnosis not present

## 2018-03-29 DIAGNOSIS — C50919 Malignant neoplasm of unspecified site of unspecified female breast: Secondary | ICD-10-CM | POA: Diagnosis not present

## 2018-03-29 MED ORDER — TECHNETIUM TC 99M MEDRONATE IV KIT: 20.0000 | PACK | Freq: Once | INTRAVENOUS | Status: DC | PRN

## 2018-03-30 ENCOUNTER — Ambulatory Visit
Admission: RE | Admit: 2018-03-30 | Discharge: 2018-03-30 | Disposition: A | Discharge status: Home / Self Care | Payer: 59 | Source: Ambulatory Visit | Attending: Hematology | Admitting: Hematology

## 2018-03-30 ENCOUNTER — Inpatient Hospital Stay: Payer: 59 | Attending: Hematology | Admitting: Hematology

## 2018-03-30 ENCOUNTER — Encounter: Payer: Self-pay | Admitting: Hematology

## 2018-03-30 VITALS — BP 153/86 | HR 72 | Temp 98.0°F | Resp 18 | Ht 64.0 in | Wt 143.3 lb

## 2018-03-30 DIAGNOSIS — R634 Abnormal weight loss: Secondary | ICD-10-CM | POA: Diagnosis not present

## 2018-03-30 DIAGNOSIS — F419 Anxiety disorder, unspecified: Secondary | ICD-10-CM | POA: Diagnosis not present

## 2018-03-30 DIAGNOSIS — Z170 Estrogen receptor positive status [ER+]: Secondary | ICD-10-CM | POA: Insufficient documentation

## 2018-03-30 DIAGNOSIS — Z5111 Encounter for antineoplastic chemotherapy: Secondary | ICD-10-CM | POA: Diagnosis not present

## 2018-03-30 DIAGNOSIS — R5383 Other fatigue: Secondary | ICD-10-CM | POA: Diagnosis not present

## 2018-03-30 DIAGNOSIS — R1011 Right upper quadrant pain: Secondary | ICD-10-CM | POA: Insufficient documentation

## 2018-03-30 DIAGNOSIS — Z5112 Encounter for antineoplastic immunotherapy: Secondary | ICD-10-CM | POA: Insufficient documentation

## 2018-03-30 DIAGNOSIS — Z803 Family history of malignant neoplasm of breast: Secondary | ICD-10-CM | POA: Insufficient documentation

## 2018-03-30 DIAGNOSIS — Z79899 Other long term (current) drug therapy: Secondary | ICD-10-CM | POA: Insufficient documentation

## 2018-03-30 DIAGNOSIS — C50411 Malignant neoplasm of upper-outer quadrant of right female breast: Secondary | ICD-10-CM | POA: Insufficient documentation

## 2018-03-30 DIAGNOSIS — C773 Secondary and unspecified malignant neoplasm of axilla and upper limb lymph nodes: Secondary | ICD-10-CM | POA: Insufficient documentation

## 2018-03-30 DIAGNOSIS — I10 Essential (primary) hypertension: Secondary | ICD-10-CM | POA: Diagnosis not present

## 2018-03-30 DIAGNOSIS — H9319 Tinnitus, unspecified ear: Secondary | ICD-10-CM | POA: Diagnosis not present

## 2018-03-30 DIAGNOSIS — C50919 Malignant neoplasm of unspecified site of unspecified female breast: Secondary | ICD-10-CM | POA: Insufficient documentation

## 2018-03-30 DIAGNOSIS — N6320 Unspecified lump in the left breast, unspecified quadrant: Secondary | ICD-10-CM

## 2018-03-30 DIAGNOSIS — C787 Secondary malignant neoplasm of liver and intrahepatic bile duct: Secondary | ICD-10-CM | POA: Diagnosis not present

## 2018-03-30 DIAGNOSIS — K219 Gastro-esophageal reflux disease without esophagitis: Secondary | ICD-10-CM | POA: Diagnosis not present

## 2018-03-30 DIAGNOSIS — N6321 Unspecified lump in the left breast, upper outer quadrant: Secondary | ICD-10-CM | POA: Diagnosis not present

## 2018-03-30 DIAGNOSIS — E860 Dehydration: Secondary | ICD-10-CM | POA: Diagnosis not present

## 2018-03-30 DIAGNOSIS — N6012 Diffuse cystic mastopathy of left breast: Secondary | ICD-10-CM | POA: Diagnosis not present

## 2018-03-30 DIAGNOSIS — Z7189 Other specified counseling: Secondary | ICD-10-CM | POA: Insufficient documentation

## 2018-03-30 DIAGNOSIS — M545 Low back pain: Secondary | ICD-10-CM | POA: Diagnosis not present

## 2018-03-30 HISTORY — PX: BREAST BIOPSY: SHX20

## 2018-03-30 MED ORDER — ONDANSETRON HCL 8 MG PO TABS: 8.0000 mg | ORAL_TABLET | Freq: Two times a day (BID) | ORAL | 1 refills | Status: DC | PRN

## 2018-03-30 MED ORDER — PROCHLORPERAZINE MALEATE 10 MG PO TABS: 10.0000 mg | ORAL_TABLET | Freq: Four times a day (QID) | ORAL | 1 refills | Status: DC | PRN

## 2018-03-30 MED ORDER — LIDOCAINE-PRILOCAINE 2.5-2.5 % EX CREA: TOPICAL_CREAM | CUTANEOUS | 3 refills | Status: DC

## 2018-03-30 NOTE — Progress Notes (Addendum)
CLINICAL TRIAL SELECTED - Breast  Trial: NRG [id number]  **Trial eligibility and accrual should be confirmed by your research team**  Patient Characteristics: Distant Metastases or Locoregional Recurrent Disease - Unresected, HER2 Positive, ER Positive, Chemotherapy + HER2-Targeted Therapy, First Line Therapeutic Status: Distant Metastases BRCA Mutation Status: Awaiting Test Results ER Status: Positive (+) HER2 Status: Positive (+) PR Status: Positive (+) Line of therapy: First Line Intent of Therapy: Non-Curative / Palliative Intent, Discussed with Patient

## 2018-03-31 ENCOUNTER — Other Ambulatory Visit: Payer: Self-pay

## 2018-03-31 ENCOUNTER — Inpatient Hospital Stay: Payer: 59

## 2018-03-31 VITALS — BP 111/76 | HR 75 | Temp 98.4°F | Resp 18

## 2018-03-31 DIAGNOSIS — C50411 Malignant neoplasm of upper-outer quadrant of right female breast: Secondary | ICD-10-CM | POA: Diagnosis not present

## 2018-03-31 DIAGNOSIS — R1011 Right upper quadrant pain: Secondary | ICD-10-CM | POA: Diagnosis not present

## 2018-03-31 DIAGNOSIS — H9319 Tinnitus, unspecified ear: Secondary | ICD-10-CM | POA: Diagnosis not present

## 2018-03-31 DIAGNOSIS — Z170 Estrogen receptor positive status [ER+]: Secondary | ICD-10-CM | POA: Diagnosis not present

## 2018-03-31 DIAGNOSIS — Z5111 Encounter for antineoplastic chemotherapy: Secondary | ICD-10-CM | POA: Diagnosis not present

## 2018-03-31 DIAGNOSIS — C787 Secondary malignant neoplasm of liver and intrahepatic bile duct: Secondary | ICD-10-CM | POA: Diagnosis not present

## 2018-03-31 DIAGNOSIS — C50919 Malignant neoplasm of unspecified site of unspecified female breast: Secondary | ICD-10-CM

## 2018-03-31 DIAGNOSIS — Z7189 Other specified counseling: Secondary | ICD-10-CM

## 2018-03-31 DIAGNOSIS — M545 Low back pain: Secondary | ICD-10-CM | POA: Diagnosis not present

## 2018-03-31 DIAGNOSIS — C773 Secondary and unspecified malignant neoplasm of axilla and upper limb lymph nodes: Secondary | ICD-10-CM | POA: Diagnosis not present

## 2018-03-31 DIAGNOSIS — F419 Anxiety disorder, unspecified: Secondary | ICD-10-CM | POA: Diagnosis not present

## 2018-03-31 MED ORDER — SODIUM CHLORIDE 0.9 % IV SOLN
INTRAVENOUS | Status: DC
  Administered 2018-03-31: 10:00:00 via INTRAVENOUS
  Filled 2018-03-31 (×2): qty 250

## 2018-03-31 MED ORDER — SODIUM CHLORIDE 0.9 % IV SOLN
Freq: Once | INTRAVENOUS | Status: DC
  Filled 2018-03-31: qty 250

## 2018-03-31 MED ORDER — LIDOCAINE-PRILOCAINE 2.5-2.5 % EX CREA: TOPICAL_CREAM | CUTANEOUS | 3 refills | Status: DC

## 2018-03-31 MED ORDER — PROCHLORPERAZINE MALEATE 10 MG PO TABS: 10.0000 mg | ORAL_TABLET | Freq: Four times a day (QID) | ORAL | 1 refills | Status: DC | PRN

## 2018-03-31 NOTE — Patient Instructions (Signed)
Dehydration, Adult Dehydration is when there is not enough fluid or water in your body. This happens when you lose more fluids than you take in. Dehydration can range from mild to very bad. It should be treated right away to keep it from getting very bad. Symptoms of mild dehydration may include:  Thirst.  Dry lips.  Slightly dry mouth.  Dry, warm skin.  Dizziness. Symptoms of moderate dehydration may include:  Very dry mouth.  Muscle cramps.  Dark pee (urine). Pee may be the color of tea.  Your body making less pee.  Your eyes making fewer tears.  Heartbeat that is uneven or faster than normal (palpitations).  Headache.  Light-headedness, especially when you stand up from sitting.  Fainting (syncope). Symptoms of very bad dehydration may include:  Changes in skin, such as: ? Cold and clammy skin. ? Blotchy (mottled) or pale skin. ? Skin that does not quickly return to normal after being lightly pinched and let go (poor skin turgor).  Changes in body fluids, such as: ? Feeling very thirsty. ? Your eyes making fewer tears. ? Not sweating when body temperature is high, such as in hot weather. ? Your body making very little pee.  Changes in vital signs, such as: ? Weak pulse. ? Pulse that is more than 100 beats a minute when you are sitting still. ? Fast breathing. ? Low blood pressure.  Other changes, such as: ? Sunken eyes. ? Cold hands and feet. ? Confusion. ? Lack of energy (lethargy). ? Trouble waking up from sleep. ? Short-term weight loss. ? Unconsciousness. Follow these instructions at home:  If told by your doctor, drink an ORS: ? Make an ORS by using instructions on the package. ? Start by drinking small amounts, about  cup (120 mL) every 5-10 minutes. ? Slowly drink more until you have had the amount that your doctor said to have.  Drink enough clear fluid to keep your pee clear or pale yellow. If you were told to drink an ORS, finish the ORS  first, then start slowly drinking clear fluids. Drink fluids such as: ? Water. Do not drink only water by itself. Doing that can make the salt (sodium) level in your body get too low (hyponatremia). ? Ice chips. ? Fruit juice that you have added water to (diluted). ? Low-calorie sports drinks.  Avoid: ? Alcohol. ? Drinks that have a lot of sugar. These include high-calorie sports drinks, fruit juice that does not have water added, and soda. ? Caffeine. ? Foods that are greasy or have a lot of fat or sugar.  Take over-the-counter and prescription medicines only as told by your doctor.  Do not take salt tablets. Doing that can make the salt level in your body get too high (hypernatremia).  Eat foods that have minerals (electrolytes). Examples include bananas, oranges, potatoes, tomatoes, and spinach.  Keep all follow-up visits as told by your doctor. This is important. Contact a doctor if:  You have belly (abdominal) pain that: ? Gets worse. ? Stays in one area (localizes).  You have a rash.  You have a stiff neck.  You get angry or annoyed more easily than normal (irritability).  You are more sleepy than normal.  You have a harder time waking up than normal.  You feel: ? Weak. ? Dizzy. ? Very thirsty.  You have peed (urinated) only a small amount of very dark pee during 6-8 hours. Get help right away if:  You have symptoms of   very bad dehydration.  You cannot drink fluids without throwing up (vomiting).  Your symptoms get worse with treatment.  You have a fever.  You have a very bad headache.  You are throwing up or having watery poop (diarrhea) and it: ? Gets worse. ? Does not go away.  You have blood or something green (bile) in your throw-up.  You have blood in your poop (stool). This may cause poop to look black and tarry.  You have not peed in 6-8 hours.  You pass out (faint).  Your heart rate when you are sitting still is more than 100 beats a  minute.  You have trouble breathing. This information is not intended to replace advice given to you by your health care provider. Make sure you discuss any questions you have with your health care provider. Document Released: 05/01/2009 Document Revised: 01/23/2016 Document Reviewed: 08/29/2015 Elsevier Interactive Patient Education  2018 Elsevier Inc.  

## 2018-04-01 ENCOUNTER — Other Ambulatory Visit: Payer: Self-pay | Admitting: Hematology

## 2018-04-03 ENCOUNTER — Other Ambulatory Visit: Payer: Self-pay | Admitting: Radiology

## 2018-04-03 NOTE — Progress Notes (Signed)
Fmla paperwork successfully faxed to [place] at [phone]. Mailed copy to patient address on file.

## 2018-04-04 ENCOUNTER — Encounter (HOSPITAL_COMMUNITY): Payer: Self-pay

## 2018-04-04 ENCOUNTER — Ambulatory Visit (HOSPITAL_COMMUNITY)
Admission: RE | Admit: 2018-04-04 | Discharge: 2018-04-04 | Disposition: A | Discharge status: Home / Self Care | Payer: 59 | Source: Ambulatory Visit | Attending: Hematology | Admitting: Hematology

## 2018-04-04 ENCOUNTER — Ambulatory Visit (HOSPITAL_COMMUNITY)
Admission: RE | Admit: 2018-04-04 | Discharge: 2018-04-04 | Disposition: A | Discharge status: Home / Self Care | Payer: 59 | Source: Ambulatory Visit | Attending: Family Medicine | Admitting: Family Medicine

## 2018-04-04 ENCOUNTER — Other Ambulatory Visit: Payer: Self-pay | Admitting: Hematology

## 2018-04-04 ENCOUNTER — Other Ambulatory Visit: Payer: Self-pay

## 2018-04-04 DIAGNOSIS — C50911 Malignant neoplasm of unspecified site of right female breast: Secondary | ICD-10-CM | POA: Insufficient documentation

## 2018-04-04 DIAGNOSIS — I10 Essential (primary) hypertension: Secondary | ICD-10-CM | POA: Insufficient documentation

## 2018-04-04 DIAGNOSIS — C50919 Malignant neoplasm of unspecified site of unspecified female breast: Secondary | ICD-10-CM

## 2018-04-04 DIAGNOSIS — K219 Gastro-esophageal reflux disease without esophagitis: Secondary | ICD-10-CM | POA: Insufficient documentation

## 2018-04-04 DIAGNOSIS — Z5111 Encounter for antineoplastic chemotherapy: Secondary | ICD-10-CM | POA: Diagnosis not present

## 2018-04-04 HISTORY — PX: IR IMAGING GUIDED PORT INSERTION: IMG5740

## 2018-04-04 LAB — CBC WITH DIFFERENTIAL/PLATELET
Basophils Absolute: 0 10*3/uL (ref 0.0–0.1)
Basophils Relative: 1 %
Eosinophils Absolute: 0.2 10*3/uL (ref 0.0–0.7)
Eosinophils Relative: 3 %
HCT: 39.4 % (ref 36.0–46.0)
Hemoglobin: 13.1 g/dL (ref 12.0–15.0)
Lymphocytes Relative: 32 %
Lymphs Abs: 2.3 10*3/uL (ref 0.7–4.0)
MCH: 28.1 pg (ref 26.0–34.0)
MCHC: 33.2 g/dL (ref 30.0–36.0)
MCV: 84.4 fL (ref 78.0–100.0)
Monocytes Absolute: 0.5 10*3/uL (ref 0.1–1.0)
Monocytes Relative: 7 %
Neutro Abs: 4.1 10*3/uL (ref 1.7–7.7)
Neutrophils Relative %: 57 %
Platelets: 320 10*3/uL (ref 150–400)
RBC: 4.67 MIL/uL (ref 3.87–5.11)
RDW: 14.9 % (ref 11.5–15.5)
WBC: 7.2 10*3/uL (ref 4.0–10.5)

## 2018-04-04 LAB — BASIC METABOLIC PANEL
Anion gap: 14 (ref 5–15)
BUN: 15 mg/dL (ref 8–23)
CO2: 26 mmol/L (ref 22–32)
Calcium: 10 mg/dL (ref 8.9–10.3)
Chloride: 100 mmol/L (ref 98–111)
Creatinine, Ser: 1.24 mg/dL — ABNORMAL HIGH (ref 0.44–1.00)
GFR calc Af Amer: 53 mL/min — ABNORMAL LOW (ref >60)
GFR calc non Af Amer: 46 mL/min — ABNORMAL LOW (ref >60)
Glucose, Bld: 96 mg/dL (ref 70–99)
Potassium: 4.2 mmol/L (ref 3.5–5.1)
Sodium: 140 mmol/L (ref 135–145)

## 2018-04-04 LAB — PROTIME-INR
INR: 0.97
Prothrombin Time: 12.8 seconds (ref 11.4–15.2)

## 2018-04-04 MED ORDER — HEPARIN SOD (PORK) LOCK FLUSH 100 UNIT/ML IV SOLN
INTRAVENOUS | Status: AC
  Filled 2018-04-04: qty 5

## 2018-04-04 MED ORDER — FENTANYL CITRATE (PF) 100 MCG/2ML IJ SOLN
INTRAMUSCULAR | Status: AC | PRN
  Administered 2018-04-04 (×2): 50 ug via INTRAVENOUS

## 2018-04-04 MED ORDER — MIDAZOLAM HCL 2 MG/2ML IJ SOLN
INTRAMUSCULAR | Status: AC
  Filled 2018-04-04: qty 2

## 2018-04-04 MED ORDER — CEFAZOLIN SODIUM-DEXTROSE 2-4 GM/100ML-% IV SOLN
2.0000 g | INTRAVENOUS | Status: AC
  Administered 2018-04-04: 2 g via INTRAVENOUS

## 2018-04-04 MED ORDER — LIDOCAINE-EPINEPHRINE (PF) 2 %-1:200000 IJ SOLN
INTRAMUSCULAR | Status: AC
  Filled 2018-04-04: qty 20

## 2018-04-04 MED ORDER — MIDAZOLAM HCL 2 MG/2ML IJ SOLN
INTRAMUSCULAR | Status: AC | PRN
  Administered 2018-04-04 (×2): 1 mg via INTRAVENOUS

## 2018-04-04 MED ORDER — SODIUM CHLORIDE 0.9 % IV SOLN
INTRAVENOUS | Status: DC
  Administered 2018-04-04: 11:00:00 via INTRAVENOUS

## 2018-04-04 MED ORDER — HEPARIN SOD (PORK) LOCK FLUSH 100 UNIT/ML IV SOLN
INTRAVENOUS | Status: AC | PRN
  Administered 2018-04-04: 500 [IU] via INTRAVENOUS

## 2018-04-04 MED ORDER — LIDOCAINE HCL 1 % IJ SOLN
INTRAMUSCULAR | Status: AC
  Filled 2018-04-04: qty 20

## 2018-04-04 MED ORDER — FENTANYL CITRATE (PF) 100 MCG/2ML IJ SOLN
INTRAMUSCULAR | Status: AC
  Filled 2018-04-04: qty 2

## 2018-04-04 MED ORDER — CEFAZOLIN SODIUM-DEXTROSE 2-4 GM/100ML-% IV SOLN
INTRAVENOUS | Status: AC
  Administered 2018-04-04: 2 g via INTRAVENOUS
  Filled 2018-04-04: qty 100

## 2018-04-04 NOTE — H&P (Signed)
Referring Physician(s): Feng,Yan  Supervising Physician: Daryll Brod  Patient Status:  [facility] OP  Chief Complaint:  "I'm here to get a port"  Subjective: Patient familiar to IR service from prior liver lesion biopsy on 03/27/2018.  She has a history of metastatic right breast carcinoma and presents again today for Port-A-Cath placement for palliative chemotherapy.  She currently denies fever, headache, chest pain, dyspnea, abdominal/back pain, nausea, vomiting or bleeding.  She does have occasional cough.  Past Medical History:  Diagnosis Date  . GERD (gastroesophageal reflux disease)   . Hypertension    Past Surgical History:  Procedure Laterality Date  . COLONOSCOPY        Allergies: Patient has no known allergies.  Medications: Prior to Admission medications   Medication Sig Start Date End Date Taking? Authorizing Provider  amLODipine (NORVASC) 5 MG tablet Take 5 mg by mouth daily.    Yes [provider]  atenolol (TENORMIN) 50 MG tablet Take 50 mg by mouth daily.    Yes [provider]  hydrochlorothiazide (HYDRODIURIL) 25 MG tablet Take 25 mg by mouth daily.   Yes [provider]  linaclotide (LINZESS) 72 MCG capsule Take 72 mcg by mouth daily as needed (IBS).    Yes [provider]  mirtazapine (REMERON) 7.5 MG tablet Take 1 tablet (7.5 mg total) by mouth at bedtime. 03/17/18  Yes [name], MD  ondansetron (ZOFRAN) 8 MG tablet Take 1 tablet (8 mg total) by mouth every 8 (eight) hours as needed for nausea or vomiting. 03/16/18  Yes [name], MD  ondansetron (ZOFRAN) 8 MG tablet Take 1 tablet (8 mg total) by mouth 2 (two) times daily as needed (Nausea or vomiting). 03/30/18  Yes [name], MD  prochlorperazine (COMPAZINE) 10 MG tablet Take 1 tablet (10 mg total) by mouth every 6 (six) hours as needed (Nausea or vomiting). 03/31/18  Yes [name], MD  ranitidine (ZANTAC) 150 MG tablet Take 150 mg by mouth 2 (two) times daily.    Yes  [provider]  traMADol (ULTRAM) 50 MG tablet Take 1 tablet (50 mg total) by mouth every 6 (six) hours as needed. 03/17/18  Yes [name], MD  lidocaine-prilocaine (EMLA) cream Apply to affected area once 03/31/18   [name], MD     Vital Signs:pend   Physical Exam: Awake, alert.  Chest clear to auscultation bilaterally.  Heart with regular rate and rhythm.  Abdomen soft, positive bowel sounds, nontender.  No lower extremity edema.  Imaging: No results found.  Labs:  CBC: Recent Labs    03/16/18 1608 03/27/18 0650 04/04/18 1037  WBC 7.7 7.7 7.2  HGB 12.6 13.0 13.1  HCT 37.2 40.5 39.4  PLT 303 275 320    COAGS: Recent Labs    03/16/18 1608 03/27/18 0650 04/04/18 1037  INR 0.97 1.02 0.97  APTT 29  --   --     BMP: Recent Labs    03/15/18 1040 03/16/18 1608 04/04/18 1037  NA  --  136 140  K  --  4.0 4.2  CL  --  95* 100  CO2  --  27 26  GLUCOSE  --  108* 96  BUN  --  21 15  CALCIUM  --  10.3 10.0  CREATININE 1.10* 1.20* 1.24*  GFRNONAA  --  48* 46*  GFRAA  --  55* 53*    LIVER FUNCTION TESTS: Recent Labs    03/16/18 1608  BILITOT 0.6  AST  190*  ALT 150*  ALKPHOS 230*  PROT 8.0  ALBUMIN 4.2    Assessment and Plan: Pt with history of metastatic right breast carcinoma ; presents today for Port-A-Cath placement for palliative chemotherapy.Risks and benefits of image guided port-a-catheter placement was discussed with the patient/daughter including, but not limited to bleeding, infection, pneumothorax, or fibrin sheath development and need for additional procedures.  All of the patient's questions were answered, patient is agreeable to proceed. Consent signed and in chart.     Electronically Signed: D. Rowe Robert, PA-C 04/04/2018, 11:13 AM   I spent a total of 20 minutes at the the patient's bedside AND on the patient's hospital floor or unit, greater than 50% of which was counseling/coordinating care for Port-A-Cath  placement

## 2018-04-04 NOTE — Discharge Instructions (Signed)
Moderate Conscious Sedation, Adult, Care After °These instructions provide you with information about caring for yourself after your procedure. Your health care provider may also give you more specific instructions. Your treatment has been planned according to current medical practices, but problems sometimes occur. Call your health care provider if you have any problems or questions after your procedure. °What can I expect after the procedure? °After your procedure, it is common: °· To feel sleepy for several hours. °· To feel clumsy and have poor balance for several hours. °· To have poor judgment for several hours. °· To vomit if you eat too soon. ° °Follow these instructions at home: °For at least 24 hours after the procedure: ° °· Do not: °? Participate in activities where you could fall or become injured. °? Drive. °? Use heavy machinery. °? Drink alcohol. °? Take sleeping pills or medicines that cause drowsiness. °? Make important decisions or sign legal documents. °? Take care of children on your own. °· Rest. °Eating and drinking °· Follow the diet recommended by your health care provider. °· If you vomit: °? Drink water, juice, or soup when you can drink without vomiting. °? Make sure you have little or no nausea before eating solid foods. °General instructions °· Have a responsible adult stay with you until you are awake and alert. °· Take over-the-counter and prescription medicines only as told by your health care provider. °· If you smoke, do not smoke without supervision. °· Keep all follow-up visits as told by your health care provider. This is important. °Contact a health care provider if: °· You keep feeling nauseous or you keep vomiting. °· You feel light-headed. °· You develop a rash. °· You have a fever. °Get help right away if: °· You have trouble breathing. °This information is not intended to replace advice given to you by your health care provider. Make sure you discuss any questions you have  with your health care provider. °Document Released: 04/25/2013 Document Revised: 12/08/2015 Document Reviewed: 10/25/2015 °Elsevier Interactive Patient Education © 2018 [employer] ° ° °Implanted Port Insertion, Care After °This sheet gives you information about how to care for yourself after your procedure. Your health care provider may also give you more specific instructions. If you have problems or questions, contact your health care provider. °What can I expect after the procedure? °After your procedure, it is common to have: °· Discomfort at the port insertion site. °· Bruising on the skin over the port. This should improve over 3-4 days. ° °Follow these instructions at home: °Port care °· After your port is placed, you will get a manufacturer's information card. The card has information about your port. Keep this card with you at all times. °· Take care of the port as told by your health care provider. Ask your health care provider if you or a family member can get training for taking care of the port at home. A home health care nurse may also take care of the port. °· Make sure to remember what type of port you have. °Incision care °· Follow instructions from your health care provider about how to take care of your port insertion site. Make sure you: °? Wash your hands with soap and water before you change your bandage (dressing). If soap and water are not available, use hand sanitizer. °? Change your dressing as told by your health care provider.  You may remove your dressing tomorrow. °? Leave stitches (sutures), skin glue, or adhesive strips   in place. These skin closures may need to stay in place for 2 weeks or longer. If adhesive strip edges start to loosen and curl up, you may trim the loose edges. Do not remove adhesive strips completely unless your health care provider tells you to do that.  DO NOT use EMLA cream for 2 weeks after port placement as this cream will remove surgical glue on your  incision. °· Check your port insertion site every day for signs of infection. Check for: °? More redness, swelling, or pain. °? More fluid or blood. °? Warmth. °? Pus or a bad smell. °General instructions °· Do not take baths, swim, or use a hot tub until your health care provider approves.  You may shower tomorrow. °· Do not lift anything that is heavier than 10 lb (4.5 kg) for a week, or as told by your health care provider. °· Ask your health care provider when it is okay to: °? Return to work or school. °? Resume usual physical activities or sports. °· Do not drive for 24 hours if you were given a medicine to help you relax (sedative). °· Take over-the-counter and prescription medicines only as told by your health care provider. °· Wear a medical alert bracelet in case of an emergency. This will tell any health care providers that you have a port. °· Keep all follow-up visits as told by your health care provider. This is important. °Contact a health care provider if: °· You cannot flush your port with saline as directed, or you cannot draw blood from the port. °· You have a fever or chills. °· You have more redness, swelling, or pain around your port insertion site. °· You have more fluid or blood coming from your port insertion site. °· Your port insertion site feels warm to the touch. °· You have pus or a bad smell coming from the port insertion site. °Get help right away if: °· You have chest pain or shortness of breath. °· You have bleeding from your port that you cannot control. °Summary °· Take care of the port as told by your health care provider. °· Change your dressing as told by your health care provider. °· Keep all follow-up visits as told by your health care provider. °This information is not intended to replace advice given to you by your health care provider. Make sure you discuss any questions you have with your health care provider. °Document Released: 04/25/2013 Document Revised: 05/26/2016  Document Reviewed: 05/26/2016 °Elsevier Interactive Patient Education © 2017 [employer] ° ° °

## 2018-04-04 NOTE — Procedures (Signed)
Breast ca  S/p LT IJ POWER PORT  TIP SVCRA NO COMP STABLE EBL MIN READY FOR USE FULL REPORT IN PACS

## 2018-04-05 ENCOUNTER — Ambulatory Visit (HOSPITAL_COMMUNITY): Payer: 59 | Attending: Internal Medicine

## 2018-04-05 ENCOUNTER — Other Ambulatory Visit: Payer: Self-pay

## 2018-04-05 ENCOUNTER — Telehealth: Payer: Self-pay

## 2018-04-05 DIAGNOSIS — C50919 Malignant neoplasm of unspecified site of unspecified female breast: Secondary | ICD-10-CM | POA: Insufficient documentation

## 2018-04-05 DIAGNOSIS — I119 Hypertensive heart disease without heart failure: Secondary | ICD-10-CM | POA: Insufficient documentation

## 2018-04-05 NOTE — Telephone Encounter (Signed)
Dropped off patient's FMLA papers in managed care office.

## 2018-04-06 ENCOUNTER — Ambulatory Visit: Payer: 59

## 2018-04-06 ENCOUNTER — Encounter: Payer: Self-pay | Admitting: Hematology

## 2018-04-06 ENCOUNTER — Other Ambulatory Visit: Payer: Self-pay

## 2018-04-06 DIAGNOSIS — C50919 Malignant neoplasm of unspecified site of unspecified female breast: Secondary | ICD-10-CM

## 2018-04-06 NOTE — Progress Notes (Signed)
Called pt to introduce myself as her Arboriculturist and to discuss copay assistance.  Pt informed me she has met her deductible and her treatment should be paid at 100% for the remainder of the year.  I told her I will contact her on 07/20/18 to apply for copay assistance when her ins renews and deductibles start over.  I also informed her of the [employer] and went over what it covers.  She would like to apply so she will bring her proof of income on 04/07/18.  I will give her my card for any questions or concerns she may have in the future.

## 2018-04-07 ENCOUNTER — Other Ambulatory Visit: Payer: 59

## 2018-04-07 ENCOUNTER — Inpatient Hospital Stay: Payer: 59

## 2018-04-07 ENCOUNTER — Inpatient Hospital Stay: Payer: 59 | Admitting: Nutrition

## 2018-04-07 ENCOUNTER — Ambulatory Visit: Payer: 59

## 2018-04-07 ENCOUNTER — Other Ambulatory Visit: Payer: Self-pay | Admitting: Hematology and Oncology

## 2018-04-07 ENCOUNTER — Encounter: Payer: Self-pay | Admitting: Hematology

## 2018-04-07 VITALS — BP 129/91 | HR 97 | Temp 99.3°F | Resp 18

## 2018-04-07 DIAGNOSIS — C787 Secondary malignant neoplasm of liver and intrahepatic bile duct: Secondary | ICD-10-CM | POA: Diagnosis not present

## 2018-04-07 DIAGNOSIS — H9319 Tinnitus, unspecified ear: Secondary | ICD-10-CM | POA: Diagnosis not present

## 2018-04-07 DIAGNOSIS — F419 Anxiety disorder, unspecified: Secondary | ICD-10-CM | POA: Diagnosis not present

## 2018-04-07 DIAGNOSIS — Z5111 Encounter for antineoplastic chemotherapy: Secondary | ICD-10-CM | POA: Diagnosis not present

## 2018-04-07 DIAGNOSIS — C50919 Malignant neoplasm of unspecified site of unspecified female breast: Secondary | ICD-10-CM

## 2018-04-07 DIAGNOSIS — C50411 Malignant neoplasm of upper-outer quadrant of right female breast: Secondary | ICD-10-CM | POA: Diagnosis not present

## 2018-04-07 DIAGNOSIS — Z7189 Other specified counseling: Secondary | ICD-10-CM

## 2018-04-07 DIAGNOSIS — C773 Secondary and unspecified malignant neoplasm of axilla and upper limb lymph nodes: Secondary | ICD-10-CM | POA: Diagnosis not present

## 2018-04-07 DIAGNOSIS — R1011 Right upper quadrant pain: Secondary | ICD-10-CM | POA: Diagnosis not present

## 2018-04-07 DIAGNOSIS — M545 Low back pain: Secondary | ICD-10-CM | POA: Diagnosis not present

## 2018-04-07 DIAGNOSIS — Z170 Estrogen receptor positive status [ER+]: Secondary | ICD-10-CM | POA: Diagnosis not present

## 2018-04-07 LAB — CBC WITH DIFFERENTIAL (CANCER CENTER ONLY)
Basophils Absolute: 0 10*3/uL (ref 0.0–0.1)
Basophils Relative: 0 %
Eosinophils Absolute: 0.1 10*3/uL (ref 0.0–0.5)
Eosinophils Relative: 2 %
HCT: 35.3 % (ref 34.8–46.6)
Hemoglobin: 11.8 g/dL (ref 11.6–15.9)
Lymphocytes Relative: 19 %
Lymphs Abs: 1.4 10*3/uL (ref 0.9–3.3)
MCH: 27.8 pg (ref 25.1–34.0)
MCHC: 33.4 g/dL (ref 31.5–36.0)
MCV: 83.3 fL (ref 79.5–101.0)
Monocytes Absolute: 0.7 10*3/uL (ref 0.1–0.9)
Monocytes Relative: 10 %
Neutro Abs: 4.9 10*3/uL (ref 1.5–6.5)
Neutrophils Relative %: 69 %
Platelet Count: 251 10*3/uL (ref 145–400)
RBC: 4.24 MIL/uL (ref 3.70–5.45)
RDW: 14.5 % (ref 11.2–14.5)
WBC Count: 7.1 10*3/uL (ref 3.9–10.3)

## 2018-04-07 LAB — CMP (CANCER CENTER ONLY)
ALT: 81 U/L — ABNORMAL HIGH (ref 0–44)
AST: 205 U/L (ref 15–41)
Albumin: 3.6 g/dL (ref 3.5–5.0)
Alkaline Phosphatase: 378 U/L — ABNORMAL HIGH (ref 38–126)
Anion gap: 12 (ref 5–15)
BUN: 16 mg/dL (ref 8–23)
CO2: 25 mmol/L (ref 22–32)
Calcium: 9.7 mg/dL (ref 8.9–10.3)
Chloride: 98 mmol/L (ref 98–111)
Creatinine: 1.01 mg/dL — ABNORMAL HIGH (ref 0.44–1.00)
GFR, Est AFR Am: >60 mL/min (ref >60)
GFR, Estimated: 59 mL/min — ABNORMAL LOW (ref >60)
Glucose, Bld: 90 mg/dL (ref 70–99)
Potassium: 3.7 mmol/L (ref 3.5–5.1)
Sodium: 135 mmol/L (ref 135–145)
Total Bilirubin: 1 mg/dL (ref 0.3–1.2)
Total Protein: 7.3 g/dL (ref 6.5–8.1)

## 2018-04-07 MED ORDER — METHYLPREDNISOLONE SODIUM SUCC 125 MG IJ SOLR
125.0000 mg | Freq: Once | INTRAMUSCULAR | Status: AC | PRN
  Administered 2018-04-07: 125 mg via INTRAVENOUS

## 2018-04-07 MED ORDER — SODIUM CHLORIDE 0.9 % IV SOLN
80.0000 mg/m2 | Freq: Once | INTRAVENOUS | Status: AC
  Administered 2018-04-07: 138 mg via INTRAVENOUS
  Filled 2018-04-07: qty 23

## 2018-04-07 MED ORDER — TRASTUZUMAB CHEMO 150 MG IV SOLR
8.0000 mg/kg | Freq: Once | INTRAVENOUS | Status: AC
  Administered 2018-04-07: 525 mg via INTRAVENOUS
  Filled 2018-04-07: qty 25

## 2018-04-07 MED ORDER — SODIUM CHLORIDE 0.9% FLUSH
10.0000 mL | INTRAVENOUS | Status: DC | PRN
  Administered 2018-04-07: 10 mL
  Filled 2018-04-07: qty 10

## 2018-04-07 MED ORDER — HEPARIN SOD (PORK) LOCK FLUSH 100 UNIT/ML IV SOLN
500.0000 [IU] | Freq: Once | INTRAVENOUS | Status: AC | PRN
  Administered 2018-04-07: 500 [IU]
  Filled 2018-04-07: qty 5

## 2018-04-07 MED ORDER — ACETAMINOPHEN 325 MG PO TABS
650.0000 mg | ORAL_TABLET | Freq: Once | ORAL | Status: AC
  Administered 2018-04-07: 650 mg via ORAL

## 2018-04-07 MED ORDER — ACETAMINOPHEN 325 MG PO TABS
ORAL_TABLET | ORAL | Status: AC
  Filled 2018-04-07: qty 2

## 2018-04-07 MED ORDER — SODIUM CHLORIDE 0.9 % IV SOLN
Freq: Once | INTRAVENOUS | Status: AC | PRN
  Administered 2018-04-07: 17:00:00 via INTRAVENOUS
  Filled 2018-04-07: qty 250

## 2018-04-07 MED ORDER — DIPHENHYDRAMINE HCL 50 MG/ML IJ SOLN
INTRAMUSCULAR | Status: AC
  Filled 2018-04-07: qty 1

## 2018-04-07 MED ORDER — DIPHENHYDRAMINE HCL 50 MG/ML IJ SOLN
25.0000 mg | Freq: Once | INTRAMUSCULAR | Status: AC | PRN
  Administered 2018-04-07: 25 mg via INTRAVENOUS

## 2018-04-07 MED ORDER — SODIUM CHLORIDE 0.9 % IV SOLN
840.0000 mg | Freq: Once | INTRAVENOUS | Status: AC
  Administered 2018-04-07: 840 mg via INTRAVENOUS
  Filled 2018-04-07: qty 28

## 2018-04-07 MED ORDER — FAMOTIDINE IN NACL 20-0.9 MG/50ML-% IV SOLN
20.0000 mg | Freq: Once | INTRAVENOUS | Status: AC
  Administered 2018-04-07: 20 mg via INTRAVENOUS

## 2018-04-07 MED ORDER — SODIUM CHLORIDE 0.9 % IV SOLN
Freq: Once | INTRAVENOUS | Status: AC
  Administered 2018-04-07: 10:00:00 via INTRAVENOUS
  Filled 2018-04-07: qty 250

## 2018-04-07 MED ORDER — DIPHENHYDRAMINE HCL 50 MG/ML IJ SOLN
50.0000 mg | Freq: Once | INTRAMUSCULAR | Status: AC
  Administered 2018-04-07: 50 mg via INTRAVENOUS

## 2018-04-07 MED ORDER — FAMOTIDINE IN NACL 20-0.9 MG/50ML-% IV SOLN
INTRAVENOUS | Status: AC
  Filled 2018-04-07: qty 50

## 2018-04-07 MED ORDER — SODIUM CHLORIDE 0.9 % IV SOLN
20.0000 mg | Freq: Once | INTRAVENOUS | Status: AC
  Administered 2018-04-07: 20 mg via INTRAVENOUS
  Filled 2018-04-07: qty 2

## 2018-04-07 NOTE — Progress Notes (Signed)
Temp 99.3 at d/c, ok per MD Gudena. Pt is aware and verbalizes understanding to call [place] [facility] with any questions or concerns.

## 2018-04-07 NOTE — Progress Notes (Signed)
62 year old female with diagnosed with metastatic breast cancer.   She is a patient of Dr. Burr Medico.  Past medical history includes hypertension and GERD.  Medications include Remeron, Zofran, Compazine, and Zantac.  Labs were reviewed.  Height: 64 inches. Weight: 143 pounds. Usual body weight: 168 pounds per patient. BMI: 24.6.  Patient reports her appetite is poor. She endorses 25 pound weight loss from usual body weight. Describes nausea with occasional vomiting. Reports constipation but has not been following MD recommendations for medication. Nutrition focused physical exam was deferred.  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  Nutrition diagnosis:  Unintended weight loss related to metastatic cancer as evidenced by 15% weight loss from usual body weight.  Intervention: Educated patient on the importance of taking nausea medicine to eliminate nausea and to increase oral intake. Also educated patient on the importance of taking medications as needed to improve constipation. Encouraged to increase water intake. Recommended small frequent meals and snacks utilizing high-protein high-calorie foods. Recommended weight maintenance. Encourage patient to drink oral nutrition supplements twice daily between meals. Provided coupons.  I gave patient fact sheets.  Her questions were answered and teach back method was used.  Monitoring, evaluation, goals: Patient will tolerate increased calories and protein to achieve weight maintenance.  Next visit: Friday, September 20 during infusion.  **Disclaimer: This note was dictated with voice recognition software. Similar sounding words can inadvertently be transcribed and this note may contain transcription errors which may not have been corrected upon publication of note.**

## 2018-04-07 NOTE — Progress Notes (Signed)
Patient experienced a Taxol hypersensitivity reaction. Extremely flushed Tachycardic with heart rate going up to 115 Was not conversing easily Hypertensive with blood pressures going up to 753 systolic  -Discontinued Taxol -IV fluids wide open -Oxygen by nasal cannula -25 mg of Benadryl IV -125 mg of Solu-Medrol IV  I recommended switching her from Taxol to Abraxane. I changed her chemo orders. We will send a request for prior authorizations

## 2018-04-07 NOTE — Progress Notes (Signed)
Pt is approved for the $1000 [employer] grant.  

## 2018-04-07 NOTE — Patient Instructions (Signed)
[facility] Discharge Instructions for Patients Receiving Chemotherapy  Today you received the following chemotherapy agents: Herceptin, Perjeta. Taxol  To help prevent nausea and vomiting after your treatment, we encourage you to take your nausea medication as directed.   If you develop nausea and vomiting that is not controlled by your nausea medication, call the clinic.   BELOW ARE SYMPTOMS THAT SHOULD BE REPORTED IMMEDIATELY:  *FEVER GREATER THAN 100.5 F  *CHILLS WITH OR WITHOUT FEVER  NAUSEA AND VOMITING THAT IS NOT CONTROLLED WITH YOUR NAUSEA MEDICATION  *UNUSUAL SHORTNESS OF BREATH  *UNUSUAL BRUISING OR BLEEDING  TENDERNESS IN MOUTH AND THROAT WITH OR WITHOUT PRESENCE OF ULCERS  *URINARY PROBLEMS  *BOWEL PROBLEMS  UNUSUAL RASH Items with * indicate a potential emergency and should be followed up as soon as possible.  Feel free to call the clinic should you have any questions or concerns. The clinic phone number is [phone].  Please show the [facility] at check-in to the Emergency Department and triage nurse.  Trastuzumab injection for infusion What is this medicine? TRASTUZUMAB (tras TOO zoo mab) is a monoclonal antibody. It is used to treat breast cancer and stomach cancer. This medicine may be used for other purposes; ask your health care provider or pharmacist if you have questions. COMMON BRAND NAME(S): Herceptin What should I tell my health care provider before I take this medicine? They need to know if you have any of these conditions: -heart disease -heart failure -lung or breathing disease, like asthma -an unusual or allergic reaction to trastuzumab, benzyl alcohol, or other medications, foods, dyes, or preservatives -pregnant or trying to get pregnant -breast-feeding How should I use this medicine? This drug is given as an infusion into a vein. It is administered in a hospital or clinic by a specially trained health care  professional. Talk to your pediatrician regarding the use of this medicine in children. This medicine is not approved for use in children. Overdosage: If you think you have taken too much of this medicine contact a poison control center or emergency room at once. NOTE: This medicine is only for you. Do not share this medicine with others. What if I miss a dose? It is important not to miss a dose. Call your doctor or health care professional if you are unable to keep an appointment. What may interact with this medicine? This medicine may interact with the following medications: -certain types of chemotherapy, such as daunorubicin, doxorubicin, epirubicin, and idarubicin This list may not describe all possible interactions. Give your health care provider a list of all the medicines, herbs, non-prescription drugs, or dietary supplements you use. Also tell them if you smoke, drink alcohol, or use illegal drugs. Some items may interact with your medicine. What should I watch for while using this medicine? Visit your doctor for checks on your progress. Report any side effects. Continue your course of treatment even though you feel ill unless your doctor tells you to stop. Call your doctor or health care professional for advice if you get a fever, chills or sore throat, or other symptoms of a cold or flu. Do not treat yourself. Try to avoid being around people who are sick. You may experience fever, chills and shaking during your first infusion. These effects are usually mild and can be treated with other medicines. Report any side effects during the infusion to your health care professional. Fever and chills usually do not happen with later infusions. Do not become  pregnant while taking this medicine or for 7 months after stopping it. Women should inform their doctor if they wish to become pregnant or think they might be pregnant. Women of child-bearing potential will need to have a negative pregnancy test  before starting this medicine. There is a potential for serious side effects to an unborn child. Talk to your health care professional or pharmacist for more information. Do not breast-feed an infant while taking this medicine or for 7 months after stopping it. Women must use effective birth control with this medicine. What side effects may I notice from receiving this medicine? Side effects that you should report to your doctor or health care professional as soon as possible: -allergic reactions like skin rash, itching or hives, swelling of the face, lips, or tongue -chest pain or palpitations -cough -dizziness -feeling faint or lightheaded, falls -fever -general ill feeling or flu-like symptoms -signs of worsening heart failure like breathing problems; swelling in your legs and feet -unusually weak or tired Side effects that usually do not require medical attention (report to your doctor or health care professional if they continue or are bothersome): -bone pain -changes in taste -diarrhea -joint pain -nausea/vomiting -weight loss This list may not describe all possible side effects. Call your doctor for medical advice about side effects. You may report side effects to FDA at 1-800-FDA-1088. Where should I keep my medicine? This drug is given in a hospital or clinic and will not be stored at home. NOTE: This sheet is a summary. It may not cover all possible information. If you have questions about this medicine, talk to your doctor, pharmacist, or health care provider.  2018 Elsevier/Gold Standard (2016-06-29 14:37:52)  Pertuzumab injection What is this medicine? PERTUZUMAB (per TOOZ ue mab) is a monoclonal antibody. It is used to treat breast cancer. This medicine may be used for other purposes; ask your health care provider or pharmacist if you have questions. COMMON BRAND NAME(S): PERJETA What should I tell my health care provider before I take this medicine? They need to know  if you have any of these conditions: -heart disease -heart failure -high blood pressure -history of irregular heart beat -recent or ongoing radiation therapy -an unusual or allergic reaction to pertuzumab, other medicines, foods, dyes, or preservatives -pregnant or trying to get pregnant -breast-feeding How should I use this medicine? This medicine is for infusion into a vein. It is given by a health care professional in a hospital or clinic setting. Talk to your pediatrician regarding the use of this medicine in children. Special care may be needed. Overdosage: If you think you have taken too much of this medicine contact a poison control center or emergency room at once. NOTE: This medicine is only for you. Do not share this medicine with others. What if I miss a dose? It is important not to miss your dose. Call your doctor or health care professional if you are unable to keep an appointment. What may interact with this medicine? Interactions are not expected. Give your health care provider a list of all the medicines, herbs, non-prescription drugs, or dietary supplements you use. Also tell them if you smoke, drink alcohol, or use illegal drugs. Some items may interact with your medicine. This list may not describe all possible interactions. Give your health care provider a list of all the medicines, herbs, non-prescription drugs, or dietary supplements you use. Also tell them if you smoke, drink alcohol, or use illegal drugs. Some items may interact with  your medicine. What should I watch for while using this medicine? Your condition will be monitored carefully while you are receiving this medicine. Report any side effects. Continue your course of treatment even though you feel ill unless your doctor tells you to stop. Do not become pregnant while taking this medicine or for 7 months after stopping it. Women should inform their doctor if they wish to become pregnant or think they might be  pregnant. Women of child-bearing potential will need to have a negative pregnancy test before starting this medicine. There is a potential for serious side effects to an unborn child. Talk to your health care professional or pharmacist for more information. Do not breast-feed an infant while taking this medicine or for 7 months after stopping it. Women must use effective birth control with this medicine. Call your doctor or health care professional for advice if you get a fever, chills or sore throat, or other symptoms of a cold or flu. Do not treat yourself. Try to avoid being around people who are sick. You may experience fever, chills, and headache during the infusion. Report any side effects during the infusion to your health care professional. What side effects may I notice from receiving this medicine? Side effects that you should report to your doctor or health care professional as soon as possible: -breathing problems -chest pain or palpitations -dizziness -feeling faint or lightheaded -fever or chills -skin rash, itching or hives -sore throat -swelling of the face, lips, or tongue -swelling of the legs or ankles -unusually weak or tired Side effects that usually do not require medical attention (report to your doctor or health care professional if they continue or are bothersome): -diarrhea -hair loss -nausea, vomiting -tiredness This list may not describe all possible side effects. Call your doctor for medical advice about side effects. You may report side effects to FDA at 1-800-FDA-1088. Where should I keep my medicine? This drug is given in a hospital or clinic and will not be stored at home. NOTE: This sheet is a summary. It may not cover all possible information. If you have questions about this medicine, talk to your doctor, pharmacist, or health care provider.  2018 Elsevier/Gold Standard (2015-08-07 12:08:50)  Paclitaxel injection What is this medicine? PACLITAXEL (PAK  li TAX el) is a chemotherapy drug. It targets fast dividing cells, like cancer cells, and causes these cells to die. This medicine is used to treat ovarian cancer, breast cancer, and other cancers. This medicine may be used for other purposes; ask your health care provider or pharmacist if you have questions. COMMON BRAND NAME(S): Onxol, Taxol What should I tell my health care provider before I take this medicine? They need to know if you have any of these conditions: -blood disorders -irregular heartbeat -infection (especially a virus infection such as chickenpox, cold sores, or herpes) -liver disease -previous or ongoing radiation therapy -an unusual or allergic reaction to paclitaxel, alcohol, polyoxyethylated castor oil, other chemotherapy agents, other medicines, foods, dyes, or preservatives -pregnant or trying to get pregnant -breast-feeding How should I use this medicine? This drug is given as an infusion into a vein. It is administered in a hospital or clinic by a specially trained health care professional. Talk to your pediatrician regarding the use of this medicine in children. Special care may be needed. Overdosage: If you think you have taken too much of this medicine contact a poison control center or emergency room at once. NOTE: This medicine is only for you. Do  not share this medicine with others. What if I miss a dose? It is important not to miss your dose. Call your doctor or health care professional if you are unable to keep an appointment. What may interact with this medicine? Do not take this medicine with any of the following medications: -disulfiram -metronidazole This medicine may also interact with the following medications: -cyclosporine -diazepam -ketoconazole -medicines to increase blood counts like filgrastim, pegfilgrastim, sargramostim -other chemotherapy drugs like cisplatin, doxorubicin, epirubicin, etoposide, teniposide,  vincristine -quinidine -testosterone -vaccines -verapamil Talk to your doctor or health care professional before taking any of these medicines: -acetaminophen -aspirin -ibuprofen -ketoprofen -naproxen This list may not describe all possible interactions. Give your health care provider a list of all the medicines, herbs, non-prescription drugs, or dietary supplements you use. Also tell them if you smoke, drink alcohol, or use illegal drugs. Some items may interact with your medicine. What should I watch for while using this medicine? Your condition will be monitored carefully while you are receiving this medicine. You will need important blood work done while you are taking this medicine. This medicine can cause serious allergic reactions. To reduce your risk you will need to take other medicine(s) before treatment with this medicine. If you experience allergic reactions like skin rash, itching or hives, swelling of the face, lips, or tongue, tell your doctor or health care professional right away. In some cases, you may be given additional medicines to help with side effects. Follow all directions for their use. This drug may make you feel generally unwell. This is not uncommon, as chemotherapy can affect healthy cells as well as cancer cells. Report any side effects. Continue your course of treatment even though you feel ill unless your doctor tells you to stop. Call your doctor or health care professional for advice if you get a fever, chills or sore throat, or other symptoms of a cold or flu. Do not treat yourself. This drug decreases your body's ability to fight infections. Try to avoid being around people who are sick. This medicine may increase your risk to bruise or bleed. Call your doctor or health care professional if you notice any unusual bleeding. Be careful brushing and flossing your teeth or using a toothpick because you may get an infection or bleed more easily. If you have any  dental work done, tell your dentist you are receiving this medicine. Avoid taking products that contain aspirin, acetaminophen, ibuprofen, naproxen, or ketoprofen unless instructed by your doctor. These medicines may hide a fever. Do not become pregnant while taking this medicine. Women should inform their doctor if they wish to become pregnant or think they might be pregnant. There is a potential for serious side effects to an unborn child. Talk to your health care professional or pharmacist for more information. Do not breast-feed an infant while taking this medicine. Men are advised not to father a child while receiving this medicine. This product may contain alcohol. Ask your pharmacist or healthcare provider if this medicine contains alcohol. Be sure to tell all healthcare providers you are taking this medicine. Certain medicines, like metronidazole and disulfiram, can cause an unpleasant reaction when taken with alcohol. The reaction includes flushing, headache, nausea, vomiting, sweating, and increased thirst. The reaction can last from 30 minutes to several hours. What side effects may I notice from receiving this medicine? Side effects that you should report to your doctor or health care professional as soon as possible: -allergic reactions like skin rash, itching or  hives, swelling of the face, lips, or tongue -low blood counts - This drug may decrease the number of white blood cells, red blood cells and platelets. You may be at increased risk for infections and bleeding. -signs of infection - fever or chills, cough, sore throat, pain or difficulty passing urine -signs of decreased platelets or bleeding - bruising, pinpoint red spots on the skin, black, tarry stools, nosebleeds -signs of decreased red blood cells - unusually weak or tired, fainting spells, lightheadedness -breathing problems -chest pain -high or low blood pressure -mouth sores -nausea and vomiting -pain, swelling, redness  or irritation at the injection site -pain, tingling, numbness in the hands or feet -slow or irregular heartbeat -swelling of the ankle, feet, hands Side effects that usually do not require medical attention (report to your doctor or health care professional if they continue or are bothersome): -bone pain -complete hair loss including hair on your head, underarms, pubic hair, eyebrows, and eyelashes -changes in the color of fingernails -diarrhea -loosening of the fingernails -loss of appetite -muscle or joint pain -red flush to skin -sweating This list may not describe all possible side effects. Call your doctor for medical advice about side effects. You may report side effects to FDA at 1-800-FDA-1088. Where should I keep my medicine? This drug is given in a hospital or clinic and will not be stored at home. NOTE: This sheet is a summary. It may not cover all possible information. If you have questions about this medicine, talk to your doctor, pharmacist, or health care provider.  2018 Elsevier/Gold Standard (2015-05-06 19:58:00)

## 2018-04-07 NOTE — Progress Notes (Signed)
Okay to treat with AST and ALT per Dr. Lindi Adie.   Patient receiving 1st time Taxol and after 15 min the patient became flushed and diaphoretic with elevated HR and BP. Stopped Taxol infusion, contacted Dr. Lindi Adie, started NS at 999, 16:33 administered 25 IV Benadryl, then 16:35 125mg  IV solumedrol, placed on 2 L O2. Patient verbalized feeling better, placed on RA, will continue to monitor until stable for DC. Taxol will not be continued at this time per Dr. Lindi Adie.

## 2018-04-10 ENCOUNTER — Ambulatory Visit: Payer: 59

## 2018-04-10 ENCOUNTER — Telehealth: Payer: Self-pay | Admitting: *Deleted

## 2018-04-10 NOTE — Telephone Encounter (Signed)
Received call from pt asking about FMLA.  Informed to bring papers in & that there is a 7-14 day turn around time.  She also reports that some of her meds are making her feel very nervous & jittery in the am.  She has taken compazine & remeron.  Informed to take zofran instead if she needs anything for nausea.  She states nausea is better.  We will eliminate compazine for now & see if this helps.  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  Message routed to Cira Rue NP

## 2018-04-11 ENCOUNTER — Encounter: Payer: Self-pay | Admitting: *Deleted

## 2018-04-11 NOTE — Progress Notes (Signed)
[facility] Clinical Social Work  Clinical Social Worker received referral from patient and patients niece requesting information on disability and financial resources.  CSW contact patient and niece at home to offer support and assess for needs.  Patient stated she was working prior to her diagnosis, but is no longer able and loss of income.  Patient has met with the financial advocate and been approved for the [employer] grant.  CSW and patient also discussed [employer] and the [employer].  Patient and niece plan to review applications and contact CSW when they are ready to complete.  CSW and patient discussed Social Security Disability and resources provided by the [facility].  Patient was agreeable to a referral.  CSW completed referral and [facility] will contact patient to discuss SSD application process.  CSW provided contact information and encouraged patient and family to call with questions or concerns.       , MSW, LCSW, OSW-C Clinical Social Worker [facility] [phone]        

## 2018-04-12 ENCOUNTER — Encounter: Payer: Self-pay | Admitting: Pharmacist

## 2018-04-12 NOTE — Progress Notes (Signed)
FMLA successfully faxed to [employer] at [phone]. Mailed copy to patient address on file. 

## 2018-04-14 ENCOUNTER — Encounter: Payer: Self-pay | Admitting: Nurse Practitioner

## 2018-04-14 ENCOUNTER — Inpatient Hospital Stay (HOSPITAL_BASED_OUTPATIENT_CLINIC_OR_DEPARTMENT_OTHER): Payer: 59 | Admitting: Nurse Practitioner

## 2018-04-14 ENCOUNTER — Telehealth: Payer: Self-pay | Admitting: Hematology

## 2018-04-14 ENCOUNTER — Inpatient Hospital Stay: Payer: 59

## 2018-04-14 VITALS — BP 130/86 | HR 71 | Temp 98.0°F | Resp 17 | Ht 64.0 in | Wt 139.0 lb

## 2018-04-14 DIAGNOSIS — R1011 Right upper quadrant pain: Secondary | ICD-10-CM

## 2018-04-14 DIAGNOSIS — C787 Secondary malignant neoplasm of liver and intrahepatic bile duct: Secondary | ICD-10-CM | POA: Diagnosis not present

## 2018-04-14 DIAGNOSIS — Z95828 Presence of other vascular implants and grafts: Secondary | ICD-10-CM

## 2018-04-14 DIAGNOSIS — R634 Abnormal weight loss: Secondary | ICD-10-CM

## 2018-04-14 DIAGNOSIS — I10 Essential (primary) hypertension: Secondary | ICD-10-CM

## 2018-04-14 DIAGNOSIS — Z170 Estrogen receptor positive status [ER+]: Secondary | ICD-10-CM | POA: Diagnosis not present

## 2018-04-14 DIAGNOSIS — C50919 Malignant neoplasm of unspecified site of unspecified female breast: Secondary | ICD-10-CM

## 2018-04-14 DIAGNOSIS — F419 Anxiety disorder, unspecified: Secondary | ICD-10-CM

## 2018-04-14 DIAGNOSIS — C773 Secondary and unspecified malignant neoplasm of axilla and upper limb lymph nodes: Secondary | ICD-10-CM

## 2018-04-14 DIAGNOSIS — C50411 Malignant neoplasm of upper-outer quadrant of right female breast: Secondary | ICD-10-CM

## 2018-04-14 DIAGNOSIS — R5383 Other fatigue: Secondary | ICD-10-CM

## 2018-04-14 DIAGNOSIS — Z79899 Other long term (current) drug therapy: Secondary | ICD-10-CM

## 2018-04-14 DIAGNOSIS — M545 Low back pain: Secondary | ICD-10-CM

## 2018-04-14 DIAGNOSIS — H9319 Tinnitus, unspecified ear: Secondary | ICD-10-CM

## 2018-04-14 DIAGNOSIS — Z5111 Encounter for antineoplastic chemotherapy: Secondary | ICD-10-CM | POA: Diagnosis not present

## 2018-04-14 DIAGNOSIS — Z7189 Other specified counseling: Secondary | ICD-10-CM

## 2018-04-14 DIAGNOSIS — Z803 Family history of malignant neoplasm of breast: Secondary | ICD-10-CM

## 2018-04-14 LAB — CBC WITH DIFFERENTIAL (CANCER CENTER ONLY)
Basophils Absolute: 0.1 10*3/uL (ref 0.0–0.1)
Basophils Relative: 1 %
Eosinophils Absolute: 0.2 10*3/uL (ref 0.0–0.5)
Eosinophils Relative: 3 %
HCT: 35.9 % (ref 34.8–46.6)
Hemoglobin: 11.8 g/dL (ref 11.6–15.9)
Lymphocytes Relative: 21 %
Lymphs Abs: 1.6 10*3/uL (ref 0.9–3.3)
MCH: 27.9 pg (ref 25.1–34.0)
MCHC: 32.9 g/dL (ref 31.5–36.0)
MCV: 84.8 fL (ref 79.5–101.0)
Monocytes Absolute: 0.6 10*3/uL (ref 0.1–0.9)
Monocytes Relative: 8 %
Neutro Abs: 5.2 10*3/uL (ref 1.5–6.5)
Neutrophils Relative %: 67 %
Platelet Count: 302 10*3/uL (ref 145–400)
RBC: 4.23 MIL/uL (ref 3.70–5.45)
RDW: 14.6 % — ABNORMAL HIGH (ref 11.2–14.5)
WBC Count: 7.8 10*3/uL (ref 3.9–10.3)

## 2018-04-14 LAB — CMP (CANCER CENTER ONLY)
ALT: 89 U/L — ABNORMAL HIGH (ref 0–44)
AST: 122 U/L — ABNORMAL HIGH (ref 15–41)
Albumin: 3.7 g/dL (ref 3.5–5.0)
Alkaline Phosphatase: 404 U/L — ABNORMAL HIGH (ref 38–126)
Anion gap: 11 (ref 5–15)
BUN: 16 mg/dL (ref 8–23)
CO2: 26 mmol/L (ref 22–32)
Calcium: 9.5 mg/dL (ref 8.9–10.3)
Chloride: 99 mmol/L (ref 98–111)
Creatinine: 1.12 mg/dL — ABNORMAL HIGH (ref 0.44–1.00)
GFR, Est AFR Am: >60 mL/min (ref >60)
GFR, Estimated: 52 mL/min — ABNORMAL LOW (ref >60)
Glucose, Bld: 95 mg/dL (ref 70–99)
Potassium: 3.4 mmol/L — ABNORMAL LOW (ref 3.5–5.1)
Sodium: 136 mmol/L (ref 135–145)
Total Bilirubin: 1.1 mg/dL (ref 0.3–1.2)
Total Protein: 7.6 g/dL (ref 6.5–8.1)

## 2018-04-14 MED ORDER — SODIUM CHLORIDE 0.9 % IV SOLN
Freq: Once | INTRAVENOUS | Status: AC
  Administered 2018-04-14: 14:00:00 via INTRAVENOUS
  Filled 2018-04-14: qty 250

## 2018-04-14 MED ORDER — PROCHLORPERAZINE MALEATE 10 MG PO TABS: 10.0000 mg | ORAL_TABLET | Freq: Once | ORAL | Status: DC

## 2018-04-14 MED ORDER — LORAZEPAM 1 MG PO TABS
0.5000 mg | ORAL_TABLET | Freq: Once | ORAL | Status: AC
  Administered 2018-04-14: 0.5 mg via ORAL

## 2018-04-14 MED ORDER — ONDANSETRON HCL 8 MG PO TABS
ORAL_TABLET | ORAL | Status: AC
  Filled 2018-04-14: qty 1

## 2018-04-14 MED ORDER — PACLITAXEL PROTEIN-BOUND CHEMO INJECTION 100 MG
80.0000 mg/m2 | Freq: Once | INTRAVENOUS | Status: AC
  Administered 2018-04-14: 125 mg via INTRAVENOUS
  Filled 2018-04-14: qty 25

## 2018-04-14 MED ORDER — ONDANSETRON HCL 8 MG PO TABS
8.0000 mg | ORAL_TABLET | Freq: Once | ORAL | Status: AC
  Administered 2018-04-14: 8 mg via ORAL

## 2018-04-14 MED ORDER — SODIUM CHLORIDE 0.9% FLUSH
10.0000 mL | INTRAVENOUS | Status: DC | PRN
  Administered 2018-04-14: 10 mL
  Filled 2018-04-14: qty 10

## 2018-04-14 MED ORDER — LORAZEPAM 1 MG PO TABS
ORAL_TABLET | ORAL | Status: AC
  Filled 2018-04-14: qty 1

## 2018-04-14 MED ORDER — HEPARIN SOD (PORK) LOCK FLUSH 100 UNIT/ML IV SOLN
500.0000 [IU] | Freq: Once | INTRAVENOUS | Status: AC | PRN
  Administered 2018-04-14: 500 [IU]
  Filled 2018-04-14: qty 5

## 2018-04-14 MED ORDER — LORAZEPAM 0.5 MG PO TABS: 0.5000 mg | ORAL_TABLET | Freq: Every evening | ORAL | 0 refills | Status: DC | PRN

## 2018-04-14 MED ORDER — SODIUM CHLORIDE 0.9% FLUSH
10.0000 mL | INTRAVENOUS | Status: DC | PRN
  Administered 2018-04-14: 10 mL via INTRAVENOUS
  Filled 2018-04-14: qty 10

## 2018-04-14 MED ORDER — DRONABINOL 2.5 MG PO CAPS: ORAL_CAPSULE | ORAL | 1 refills | Status: DC

## 2018-04-14 NOTE — Progress Notes (Signed)
[place]  [phone]) [phone] [phone]) [phone]  Clinic Follow up Note   Patient Care Team: Levin Bacon, NP as PCP - General (Family Medicine) [name], MD as Consulting Physician (Gastroenterology) [name], MD as Consulting Physician (Hematology) 04/14/2018  SUMMARY OF ONCOLOGIC HISTORY: Oncology History   Cancer Staging Metastatic breast cancer [facility]) Staging form: Breast, AJCC 8th Edition - Clinical stage from 03/24/2018: Stage IV (cT2, cN1, pM1, G3, ER+, PR+, HER2+) - Signed by [name], MD on 03/30/2018       Metastatic breast cancer ([place])   01/30/2018 Procedure    Colonoscopy showed small polyp in the sigmoid colon, removed, the exam of colon including the terminal ileum was otherwise negative.    01/30/2018 Procedure    EGD by Dr. Collene Mares showed small hiatal hernia, a 8 mm polypoid lesion in the cardia, biopsied.    03/09/2018 Imaging    03/09/2018 US Abdomen IMPRESSION: 1. Mass lesions throughout the liver, consistent with metastatic disease. Liver as a somewhat nodular contour suggesting underlying hepatic cirrhosis. Inhomogeneous echotexture to the liver.  2. Cholelithiasis with mild gallbladder wall thickening. A degree of cholecystitis cannot be excluded by ultrasound.  3. Portions of pancreas obscured by gas. Visualized portions of pancreas appear normal.  4. Small right kidney. Etiology uncertain. This finding potentially may be indicative of renal artery stenosis. In this regard, question whether patient is hypertensive.    03/15/2018 Imaging    CT CAP with contrast  IMPRESSION: 1. Widespread hepatic metastasis. 2. 2.6 cm lateral right breast soft tissue nodule could represent a breast primary or an incidental benign lesion. Consider correlation with mammogram and ultrasound. 3. No definite source of primary malignancy identified within the abdomen or pelvis. There is possible rectosigmoid junction wall thickening. Consider  colonoscopy with attention to this area. 4. Distal esophageal wall thickening, suggesting esophagitis.    03/24/2018 Cancer Staging    Staging form: Breast, AJCC 8th Edition - Clinical stage from 03/24/2018: Stage IV (cT2, cN1, pM1, G3, ER+, PR+, HER2+) - Signed by [name], MD on 03/30/2018    03/24/2018 Initial Biopsy    Diagnosis 1. 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 Breast, right, needle core biopsy, 9 o'clock, 7cm from nipple - INVASIVE DUCTAL CARCINOMA. -The carcinoma is somewhat morphologically dissimilar from that in part 1. It appears grade III 3. Lymph node, needle/core biopsy, right axillary - METASTATIC CARCINOMA IN 1 OF 1 LYMPH NODE (1/1).    03/24/2018 Receptors her2    Breast biopsy: 1. Estrogen Receptor: 40%, POSITIVE, STRONG-MODERATE STAINING INTENSITY Progesterone Receptor: 70%, POSITIVE, STRONG STAINING INTENSITY Proliferation Marker Ki67: 20% HER 2 equivocal by IHC 2+, POSITIVE by FISH, ratio 2.4 and copy #4.2  2. Estrogen Receptor: 60%, POSITIVE, MODERATE STAINING INTENSITY Progesterone Receptor: 40%, POSITIVE, MODERATE STAINING INTENSITY Proliferation Marker Ki67: 20% HER2 (+) by IHC 3+    03/24/2018 Initial Diagnosis    Metastatic breast cancer ([place])    03/27/2018 Pathology Results    Diagnosis Liver, needle/core biopsy, Right - METASTATIC CARCINOMA TO LIVER, CONSISTENT WITH PATIENTS CLINICAL HISTORY OF PRIMARY BREAST CARCINOMA.  ER 80%+ PR40%+ HER2- (by [facility], IHC 2+)  Ki67 50%     03/28/2018 Pathology Results    03/28/2018 Surgical Pathology Diagnosis 1. Breast, left, needle core biopsy, 9 o'clock - FIBROCYSTIC CHANGES. - THERE IS NO EVIDENCE OF MALIGNANCY. 2. Breast, left, needle core biopsy, 2 o'clock - FIBROADENOMA. -  THERE IS NO EVIDENCE OF MALIGNANCY. - SEE COMMENT.    03/29/2018 Imaging    03/29/2018 Bone Scan IMPRESSION: No scintigraphic evidence  of osseous metastatic disease.    03/30/2018 Imaging    Bone scan  IMPRESSION: No scintigraphic evidence of osseous metastatic disease.     04/05/2018 -  Chemotherapy    The patient had PACLitaxel-protein bound (ABRAXANE) chemo infusion 125 mg, 80 mg/m2 = 125 mg (100 % of original dose 80 mg/m2), Intravenous,  Once, 1 of 6 cycles Dose modification: 80 mg/m2 (original dose 80 mg/m2, Cycle 1) Administration: 125 mg (04/14/2018) trastuzumab (HERCEPTIN) 525 mg in sodium chloride 0.9 % 250 mL chemo infusion, 8 mg/kg = 525 mg, Intravenous,  Once, 1 of 6 cycles Administration: 525 mg (04/07/2018) PACLitaxel (TAXOL) 138 mg in sodium chloride 0.9 % 250 mL chemo infusion (</= 34m/m2), 80 mg/m2 = 138 mg, Intravenous,  Once, 1 of 1 cycle Administration: 138 mg (04/07/2018) pertuzumab (PERJETA) 840 mg in sodium chloride 0.9 % 250 mL chemo infusion, 840 mg, Intravenous, Once, 1 of 6 cycles Administration: 840 mg (04/07/2018)  for chemotherapy treatment.    CURRENT THERAPY:  first line chemo weekly Taxol and herceptin/Perjeta every 3 weeks, began 04/07/18; changed to abraxane with cycle 1 day 8 due to taxol reaction with first dose   INTERVAL HISTORY: [name] with her daughter for follow up and cycle 1 day 8 chemo as scheduled. She completed day 1 with taxol and herceptin/perjeta on 9/20. She developed reaction third of the way through taxol with flushing, tachycardia and HTN. Dr. GLindi Adieassessed her who felt she had difficulty conversing and discontinued Taxol. She was stabilized and discharged home in stable condition. She did well with treatment otherwise. She developed hand shaking/tremor after taking compazine and mirtazapine so she stopped both. She uses zofran instead which helps but causes mild constipation. Not much nausea. She vomited once last night because she did not eat all day since breakfast. She had diarrhea once due to eating chocolate pudding. Has imodium on hand. She has some  ringing in her ears with ear congestion, present before chemo. Nagging dry cough is at baseline. Denies fever, chills, chest pain, dyspnea, edema, or neuropathy.    MEDICAL HISTORY:  Past Medical History:  Diagnosis Date  . GERD (gastroesophageal reflux disease)   . Hypertension     SURGICAL HISTORY: Past Surgical History:  Procedure Laterality Date  . COLONOSCOPY    . IR IMAGING GUIDED PORT INSERTION  04/04/2018    I have reviewed the social history and family history with the patient and they are unchanged from previous note.  ALLERGIES:  has No Known Allergies.  MEDICATIONS:  Current Outpatient Medications  Medication Sig Dispense Refill  . amLODipine (NORVASC) 5 MG tablet Take 5 mg by mouth daily.     .Marland Kitchenatenolol (TENORMIN) 50 MG tablet Take 50 mg by mouth daily.     . hydrochlorothiazide (HYDRODIURIL) 25 MG tablet Take 25 mg by mouth daily.    .Marland Kitchenlinaclotide (LINZESS) 72 MCG capsule Take 72 mcg by mouth daily as needed (IBS).     .Marland Kitchenranitidine (ZANTAC) 150 MG tablet Take 150 mg by mouth 2 (two) times daily.     . traMADol (ULTRAM) 50 MG tablet Take 1 tablet (50 mg total) by mouth every 6 (six) hours as needed. 30 tablet 1  . dronabinol (MARINOL) 2.5 MG capsule Take 1 capsule twice daily before meals as needed 30 capsule 1  . lidocaine-prilocaine (EMLA)  cream Apply to affected area once (Patient not taking: Reported on 04/14/2018) 30 g 3  . LORazepam (ATIVAN) 0.5 MG tablet Take 1 tablet (0.5 mg total) by mouth at bedtime as needed for anxiety. 30 tablet 0  . mirtazapine (REMERON) 7.5 MG tablet Take 1 tablet (7.5 mg total) by mouth at bedtime. 30 tablet 0  . ondansetron (ZOFRAN) 8 MG tablet Take 1 tablet (8 mg total) by mouth every 8 (eight) hours as needed for nausea or vomiting. (Patient not taking: Reported on 04/14/2018) 30 tablet 0  . ondansetron (ZOFRAN) 8 MG tablet Take 1 tablet (8 mg total) by mouth 2 (two) times daily as needed (Nausea or vomiting). (Patient not taking:  Reported on 04/14/2018) 30 tablet 1  . prochlorperazine (COMPAZINE) 10 MG tablet Take 1 tablet (10 mg total) by mouth every 6 (six) hours as needed (Nausea or vomiting). 30 tablet 1   No current facility-administered medications for this visit.    Facility-Administered Medications Ordered in Other Visits  Medication Dose Route Frequency Provider Last Rate Last Dose  . sodium chloride flush (NS) 0.9 % injection 10 mL  10 mL Intracatheter PRN [name], MD   10 mL at 04/14/18 1435    PHYSICAL EXAMINATION: ECOG PERFORMANCE STATUS: 1 - Symptomatic but completely ambulatory  Vitals:   04/14/18 1120  BP: 130/86  Pulse: 71  Resp: 17  Temp: 98 F (36.7 C)  SpO2: 100%   Filed Weights   04/14/18 1120  Weight: 139 lb (63 kg)    GENERAL:alert, no distress and comfortable SKIN: no rashes or significant lesions EYES:  sclera clear OROPHARYNX:no thrush or ulcers LYMPH:  no palpable cervical, supraclavicular, or axillary lymphadenopathy  LUNGS: clear to auscultation with normal breathing effort HEART: regular rate & rhythm, no lower extremity edema ABDOMEN:abdomen soft, non-tender and normal bowel sounds Musculoskeletal:no cyanosis of digits and no clubbing  NEURO: alert & oriented x 3 with fluent speech, no focal motor/sensory deficits Breast exam: 2-2.5 cm palpable mass in right breast at 9 o'clock position. No other masses or axillary adenopathy PAC covered with gauze, no obvious erythema   LABORATORY DATA:  I have reviewed the data as listed CBC Latest Ref Rng & Units 04/14/2018 04/07/2018 04/04/2018  WBC 3.9 - 10.3 K/uL 7.8 7.1 7.2  Hemoglobin 11.6 - 15.9 g/dL 11.8 11.8 13.1  Hematocrit 34.8 - 46.6 % 35.9 35.3 39.4  Platelets 145 - 400 K/uL 302 251 320     CMP Latest Ref Rng & Units 04/14/2018 04/07/2018 04/04/2018  Glucose 70 - 99 mg/dL 95 90 96  BUN 8 - 23 mg/dL _0 Creatinine 0.44 - 1.00 mg/dL 1.12(H) 1.01(H) 1.24(H)  Sodium 135 - 145 mmol/L 136 135 140  Potassium 3.5 -  5.1 mmol/L 3.4(L) 3.7 4.2  Chloride 98 - 111 mmol/L 99 98 100  CO2 22 - 32 mmol/L _1 Calcium 8.9 - 10.3 mg/dL 9.5 9.7 10.0  Total Protein 6.5 - 8.1 g/dL 7.6 7.3 -  Total Bilirubin 0.3 - 1.2 mg/dL 1.1 1.0 -  Alkaline Phos 38 - 126 U/L 404(H) 378(H) -  AST 15 - 41 U/L 122(H) 205(HH) -  ALT 0 - 44 U/L 89(H) 81(H) -      RADIOGRAPHIC STUDIES: I have personally reviewed the radiological images as listed and agreed with the findings in the report. No results found.   ASSESSMENT & PLAN: Teresa Hays is a 62 y.o. female with history of HTN, GERD  1. Metastatic right breast cancer to liver, cT2N1M1, stage IV, ER+/PR+/HER2+, Liver mets ER+/PR+/HER2- -She appears stable. She completed cycle 1 weekly taxol and herceptin/perjeta (q3 weeks) on 9/20. She developed infusion reaction to taxol and it was discontinued. Otherwise, she tolerated well with mild nausea and diarrhea, worsened by her diet.  -she had tremors with compazine, she stopped using it and has good relief with zofran. -Unclear if her tinnitus is related to chemo, she had it before so likely unrelated. She can dry decongestant for ear congestion and tinnitus  -she has anxiety about her diagnosis and treatment, she is requesting ativan during today's treatment and at home, I prescribed for her today -She has recovered well. Lasb reviewed, CBC is unremarkable. AST imrpoved, alk phos slightly increased, renal function is stable. Overall labs adequate to proceed with cycle 1 day 8 chemo, will change to abraxane given her taxol reaction. She agrees to proceed.  -Dr. Burr Medico discussed screening her for the [place] trial with the research nurse today, she is looking into it.  -she will return for weekly abraxane and w3 weeks herceptin/perjeta (due in 2 weeks) -f/u in 2 weeks   2. Upper abdominal and back pain -She has tried Tylenol and ibuprofen, did not help her much -I previously prescribed her tramadol 50 mg every 6 hours as  needed for pain. She has not been taking much due to the drowsiness. It was recommended previously to take 1/2 tab   3. Anorexia and weight loss  -Secondary to metastatic cancer.   -previously prescribed mirtazapine and dietitian consult -She had tremors on mirtazapine, I recommend she discontinue -will try marinol next, I prescribed today; if this is not helpful, may try megace  4. Social support   5. Goal of care discussion  -she is full code   Plan: -Labs reviewed, proceed with cycle 1 day 8 weekly chemotherapy, first dose abraxane  -Continue herceptin/perjeta q3 weeks, last given 9/20  -Ativan po x1 in infusion today for anxiety -Rx: ativan for anxiety, marinol for anorexia and weight loss  -can try cold/decongestant for ear ringing and congestion  -f/u in 2 weeks   All questions were answered. The patient knows to call the clinic with any problems, questions or concerns. No barriers to learning was detected. I spent 20  counseling the patient face to face. The total time spent in the appointment was 25 minutes and more than 50% was on counseling and review of test results     Alla Feeling, NP 04/14/18

## 2018-04-14 NOTE — Telephone Encounter (Signed)
No los per 9/27 los

## 2018-04-14 NOTE — Patient Instructions (Addendum)
[facility] Discharge Instructions for Patients Receiving Chemotherapy  Today you received the following chemotherapy agents:  Abraxane.  To help prevent nausea and vomiting after your treatment, we encourage you to take your nausea medication as directed.   If you develop nausea and vomiting that is not controlled by your nausea medication, call the clinic.   BELOW ARE SYMPTOMS THAT SHOULD BE REPORTED IMMEDIATELY:  *FEVER GREATER THAN 100.5 F  *CHILLS WITH OR WITHOUT FEVER  NAUSEA AND VOMITING THAT IS NOT CONTROLLED WITH YOUR NAUSEA MEDICATION  *UNUSUAL SHORTNESS OF BREATH  *UNUSUAL BRUISING OR BLEEDING  TENDERNESS IN MOUTH AND THROAT WITH OR WITHOUT PRESENCE OF ULCERS  *URINARY PROBLEMS  *BOWEL PROBLEMS  UNUSUAL RASH Items with * indicate a potential emergency and should be followed up as soon as possible.  Feel free to call the clinic should you have any questions or concerns. The clinic phone number is [phone].  Please show the CHEMO ALERT CARD at check-in to the Emergency Department and triage nurse.  Nanoparticle Albumin-Bound Paclitaxel injection What is this medicine? NANOPARTICLE ALBUMIN-BOUND PACLITAXEL (Na no PAHR ti kuhl al BYOO muhn-bound PAK li TAX el) is a chemotherapy drug. It targets fast dividing cells, like cancer cells, and causes these cells to die. This medicine is used to treat advanced breast cancer and advanced lung cancer. This medicine may be used for other purposes; ask your health care provider or pharmacist if you have questions. COMMON BRAND NAME(S): Abraxane What should I tell my health care provider before I take this medicine? They need to know if you have any of these conditions: -kidney disease -liver disease -low blood counts, like low platelets, red blood cells, or white blood cells -recent or ongoing radiation therapy -an unusual or allergic reaction to paclitaxel, albumin, other chemotherapy, other medicines,  foods, dyes, or preservatives -pregnant or trying to get pregnant -breast-feeding How should I use this medicine? This drug is given as an infusion into a vein. It is administered in a hospital or clinic by a specially trained health care professional. Talk to your pediatrician regarding the use of this medicine in children. Special care may be needed. Overdosage: If you think you have taken too much of this medicine contact a poison control center or emergency room at once. NOTE: This medicine is only for you. Do not share this medicine with others. What if I miss a dose? It is important not to miss your dose. Call your doctor or health care professional if you are unable to keep an appointment. What may interact with this medicine? -cyclosporine -diazepam -ketoconazole -medicines to increase blood counts like filgrastim, pegfilgrastim, sargramostim -other chemotherapy drugs like cisplatin, doxorubicin, epirubicin, etoposide, teniposide, vincristine -quinidine -testosterone -vaccines -verapamil Talk to your doctor or health care professional before taking any of these medicines: -acetaminophen -aspirin -ibuprofen -ketoprofen -naproxen This list may not describe all possible interactions. Give your health care provider a list of all the medicines, herbs, non-prescription drugs, or dietary supplements you use. Also tell them if you smoke, drink alcohol, or use illegal drugs. Some items may interact with your medicine. What should I watch for while using this medicine? Your condition will be monitored carefully while you are receiving this medicine. You will need important blood work done while you are taking this medicine. This medicine can cause serious allergic reactions. If you experience allergic reactions like skin rash, itching or hives, swelling of the face, lips, or tongue, tell your doctor or health care   professional right away. In some cases, you may be given additional  medicines to help with side effects. Follow all directions for their use. This drug may make you feel generally unwell. This is not uncommon, as chemotherapy can affect healthy cells as well as cancer cells. Report any side effects. Continue your course of treatment even though you feel ill unless your doctor tells you to stop. Call your doctor or health care professional for advice if you get a fever, chills or sore throat, or other symptoms of a cold or flu. Do not treat yourself. This drug decreases your body's ability to fight infections. Try to avoid being around people who are sick. This medicine may increase your risk to bruise or bleed. Call your doctor or health care professional if you notice any unusual bleeding. Be careful brushing and flossing your teeth or using a toothpick because you may get an infection or bleed more easily. If you have any dental work done, tell your dentist you are receiving this medicine. Avoid taking products that contain aspirin, acetaminophen, ibuprofen, naproxen, or ketoprofen unless instructed by your doctor. These medicines may hide a fever. Do not become pregnant while taking this medicine. Women should inform their doctor if they wish to become pregnant or think they might be pregnant. There is a potential for serious side effects to an unborn child. Talk to your health care professional or pharmacist for more information. Do not breast-feed an infant while taking this medicine. Men are advised not to father a child while receiving this medicine. What side effects may I notice from receiving this medicine? Side effects that you should report to your doctor or health care professional as soon as possible: -allergic reactions like skin rash, itching or hives, swelling of the face, lips, or tongue -low blood counts - This drug may decrease the number of white blood cells, red blood cells and platelets. You may be at increased risk for infections and  bleeding. -signs of infection - fever or chills, cough, sore throat, pain or difficulty passing urine -signs of decreased platelets or bleeding - bruising, pinpoint red spots on the skin, black, tarry stools, nosebleeds -signs of decreased red blood cells - unusually weak or tired, fainting spells, lightheadedness -breathing problems -changes in vision -chest pain -high or low blood pressure -mouth sores -nausea and vomiting -pain, swelling, redness or irritation at the injection site -pain, tingling, numbness in the hands or feet -slow or irregular heartbeat -swelling of the ankle, feet, hands Side effects that usually do not require medical attention (report to your doctor or health care professional if they continue or are bothersome): -aches, pains -changes in the color of fingernails -diarrhea -hair loss -loss of appetite This list may not describe all possible side effects. Call your doctor for medical advice about side effects. You may report side effects to FDA at 1-800-FDA-1088. Where should I keep my medicine? This drug is given in a hospital or clinic and will not be stored at home. NOTE: This sheet is a summary. It may not cover all possible information. If you have questions about this medicine, talk to your doctor, pharmacist, or health care provider.  2018 Elsevier/Gold Standard (2015-05-07 10:05:20)    

## 2018-04-14 NOTE — Progress Notes (Signed)
Per Cira Rue, NP ok to treat with current LFT's.

## 2018-04-14 NOTE — Telephone Encounter (Signed)
Thanks, I addressed with her today.  Regan Rakers NP

## 2018-04-18 ENCOUNTER — Inpatient Hospital Stay: Payer: 59

## 2018-04-18 ENCOUNTER — Telehealth: Payer: Self-pay | Admitting: *Deleted

## 2018-04-18 ENCOUNTER — Inpatient Hospital Stay: Payer: 59 | Attending: Hematology | Admitting: Medical

## 2018-04-18 VITALS — BP 125/90 | HR 87 | Temp 97.6°F | Resp 18

## 2018-04-18 VITALS — BP 119/82 | HR 95 | Temp 98.0°F | Resp 18 | Ht 64.0 in | Wt 134.1 lb

## 2018-04-18 DIAGNOSIS — Z5111 Encounter for antineoplastic chemotherapy: Secondary | ICD-10-CM | POA: Insufficient documentation

## 2018-04-18 DIAGNOSIS — M545 Low back pain: Secondary | ICD-10-CM | POA: Insufficient documentation

## 2018-04-18 DIAGNOSIS — R112 Nausea with vomiting, unspecified: Secondary | ICD-10-CM | POA: Diagnosis not present

## 2018-04-18 DIAGNOSIS — R197 Diarrhea, unspecified: Secondary | ICD-10-CM | POA: Insufficient documentation

## 2018-04-18 DIAGNOSIS — Z803 Family history of malignant neoplasm of breast: Secondary | ICD-10-CM | POA: Insufficient documentation

## 2018-04-18 DIAGNOSIS — C50411 Malignant neoplasm of upper-outer quadrant of right female breast: Secondary | ICD-10-CM | POA: Insufficient documentation

## 2018-04-18 DIAGNOSIS — R740 Nonspecific elevation of levels of transaminase and lactic acid dehydrogenase [LDH]: Secondary | ICD-10-CM | POA: Diagnosis not present

## 2018-04-18 DIAGNOSIS — Z170 Estrogen receptor positive status [ER+]: Secondary | ICD-10-CM | POA: Diagnosis not present

## 2018-04-18 DIAGNOSIS — R300 Dysuria: Secondary | ICD-10-CM | POA: Diagnosis not present

## 2018-04-18 DIAGNOSIS — K219 Gastro-esophageal reflux disease without esophagitis: Secondary | ICD-10-CM | POA: Diagnosis not present

## 2018-04-18 DIAGNOSIS — Z23 Encounter for immunization: Secondary | ICD-10-CM | POA: Diagnosis not present

## 2018-04-18 DIAGNOSIS — C787 Secondary malignant neoplasm of liver and intrahepatic bile duct: Secondary | ICD-10-CM | POA: Insufficient documentation

## 2018-04-18 DIAGNOSIS — Z79899 Other long term (current) drug therapy: Secondary | ICD-10-CM | POA: Diagnosis not present

## 2018-04-18 DIAGNOSIS — Z5112 Encounter for antineoplastic immunotherapy: Secondary | ICD-10-CM | POA: Diagnosis present

## 2018-04-18 DIAGNOSIS — C50919 Malignant neoplasm of unspecified site of unspecified female breast: Secondary | ICD-10-CM

## 2018-04-18 DIAGNOSIS — D649 Anemia, unspecified: Secondary | ICD-10-CM | POA: Diagnosis not present

## 2018-04-18 DIAGNOSIS — R630 Anorexia: Secondary | ICD-10-CM | POA: Diagnosis not present

## 2018-04-18 DIAGNOSIS — E860 Dehydration: Secondary | ICD-10-CM | POA: Insufficient documentation

## 2018-04-18 DIAGNOSIS — K5900 Constipation, unspecified: Secondary | ICD-10-CM | POA: Insufficient documentation

## 2018-04-18 DIAGNOSIS — E876 Hypokalemia: Secondary | ICD-10-CM | POA: Diagnosis not present

## 2018-04-18 DIAGNOSIS — I10 Essential (primary) hypertension: Secondary | ICD-10-CM | POA: Diagnosis not present

## 2018-04-18 DIAGNOSIS — Z95828 Presence of other vascular implants and grafts: Secondary | ICD-10-CM

## 2018-04-18 LAB — CBC WITH DIFFERENTIAL (CANCER CENTER ONLY)
Basophils Absolute: 0.1 10*3/uL (ref 0.0–0.1)
Basophils Relative: 1 %
Eosinophils Absolute: 0 10*3/uL (ref 0.0–0.5)
Eosinophils Relative: 1 %
HCT: 37.6 % (ref 34.8–46.6)
Hemoglobin: 12.4 g/dL (ref 11.6–15.9)
Lymphocytes Relative: 14 %
Lymphs Abs: 0.9 10*3/uL (ref 0.9–3.3)
MCH: 27.7 pg (ref 25.1–34.0)
MCHC: 33.1 g/dL (ref 31.5–36.0)
MCV: 83.9 fL (ref 79.5–101.0)
Monocytes Absolute: 0.1 10*3/uL (ref 0.1–0.9)
Monocytes Relative: 2 %
Neutro Abs: 5.2 10*3/uL (ref 1.5–6.5)
Neutrophils Relative %: 82 %
Platelet Count: 193 10*3/uL (ref 145–400)
RBC: 4.48 MIL/uL (ref 3.70–5.45)
RDW: 14.3 % (ref 11.2–14.5)
WBC Count: 6.3 10*3/uL (ref 3.9–10.3)

## 2018-04-18 LAB — CMP (CANCER CENTER ONLY)
ALT: 118 U/L — ABNORMAL HIGH (ref 0–44)
AST: 304 U/L (ref 15–41)
Albumin: 3.6 g/dL (ref 3.5–5.0)
Alkaline Phosphatase: 362 U/L — ABNORMAL HIGH (ref 38–126)
Anion gap: 12 (ref 5–15)
BUN: 27 mg/dL — ABNORMAL HIGH (ref 8–23)
CO2: 27 mmol/L (ref 22–32)
Calcium: 9.6 mg/dL (ref 8.9–10.3)
Chloride: 94 mmol/L — ABNORMAL LOW (ref 98–111)
Creatinine: 1.38 mg/dL — ABNORMAL HIGH (ref 0.44–1.00)
GFR, Est AFR Am: 47 mL/min — ABNORMAL LOW (ref >60)
GFR, Estimated: 40 mL/min — ABNORMAL LOW (ref >60)
Glucose, Bld: 112 mg/dL — ABNORMAL HIGH (ref 70–99)
Potassium: 3.5 mmol/L (ref 3.5–5.1)
Sodium: 133 mmol/L — ABNORMAL LOW (ref 135–145)
Total Bilirubin: 0.8 mg/dL (ref 0.3–1.2)
Total Protein: 7.5 g/dL (ref 6.5–8.1)

## 2018-04-18 MED ORDER — SODIUM CHLORIDE 0.9% FLUSH
10.0000 mL | INTRAVENOUS | Status: DC | PRN
  Administered 2018-04-18: 10 mL via INTRAVENOUS
  Filled 2018-04-18: qty 10

## 2018-04-18 MED ORDER — DEXAMETHASONE SODIUM PHOSPHATE 10 MG/ML IJ SOLN
10.0000 mg | Freq: Once | INTRAMUSCULAR | Status: AC
  Administered 2018-04-18: 10 mg via INTRAVENOUS

## 2018-04-18 MED ORDER — ONDANSETRON HCL 4 MG/2ML IJ SOLN
INTRAMUSCULAR | Status: AC
  Filled 2018-04-18: qty 4

## 2018-04-18 MED ORDER — HEPARIN SOD (PORK) LOCK FLUSH 100 UNIT/ML IV SOLN
500.0000 [IU] | Freq: Once | INTRAVENOUS | Status: DC
  Filled 2018-04-18: qty 5

## 2018-04-18 MED ORDER — DEXAMETHASONE SODIUM PHOSPHATE 10 MG/ML IJ SOLN
INTRAMUSCULAR | Status: AC
  Filled 2018-04-18: qty 1

## 2018-04-18 MED ORDER — ONDANSETRON HCL 4 MG/2ML IJ SOLN
8.0000 mg | Freq: Once | INTRAMUSCULAR | Status: AC
  Administered 2018-04-18: 8 mg via INTRAVENOUS

## 2018-04-18 MED ORDER — SODIUM CHLORIDE 0.9 % IV SOLN
Freq: Once | INTRAVENOUS | Status: AC
  Administered 2018-04-18: 15:00:00 via INTRAVENOUS
  Filled 2018-04-18: qty 250

## 2018-04-18 MED ORDER — SODIUM CHLORIDE 0.9 % IV SOLN: Freq: Once | INTRAVENOUS | Status: DC

## 2018-04-18 MED ORDER — HEPARIN SOD (PORK) LOCK FLUSH 100 UNIT/ML IV SOLN
500.0000 [IU] | Freq: Once | INTRAVENOUS | Status: AC
  Administered 2018-04-18: 500 [IU] via INTRAVENOUS
  Filled 2018-04-18: qty 5

## 2018-04-18 NOTE — Telephone Encounter (Signed)
Received vm call from pt stating that she can't keep anything down no matter what she is given.  She also reports a cough that she can't shake.  Returned call & she has taken her marinol.  She reports not being able to take compazine due to making her shake.  She has not taken her zofran today but took twice yesterday.  Encouraged to take zofran & wait 30 min & see if she can get anything down. She reports drinking Ensure yesterday. She feels that she needs IVF & reports some diarrhea also.  Appt made for 1:30 for labs/port flush & see Lucianne Lei @ 2 pm.

## 2018-04-18 NOTE — Patient Instructions (Signed)
Dehydration, Adult Dehydration is when there is not enough fluid or water in your body. This happens when you lose more fluids than you take in. Dehydration can range from mild to very bad. It should be treated right away to keep it from getting very bad. Symptoms of mild dehydration may include:  Thirst.  Dry lips.  Slightly dry mouth.  Dry, warm skin.  Dizziness. Symptoms of moderate dehydration may include:  Very dry mouth.  Muscle cramps.  Dark pee (urine). Pee may be the color of tea.  Your body making less pee.  Your eyes making fewer tears.  Heartbeat that is uneven or faster than normal (palpitations).  Headache.  Light-headedness, especially when you stand up from sitting.  Fainting (syncope). Symptoms of very bad dehydration may include:  Changes in skin, such as: ? Cold and clammy skin. ? Blotchy (mottled) or pale skin. ? Skin that does not quickly return to normal after being lightly pinched and let go (poor skin turgor).  Changes in body fluids, such as: ? Feeling very thirsty. ? Your eyes making fewer tears. ? Not sweating when body temperature is high, such as in hot weather. ? Your body making very little pee.  Changes in vital signs, such as: ? Weak pulse. ? Pulse that is more than 100 beats a minute when you are sitting still. ? Fast breathing. ? Low blood pressure.  Other changes, such as: ? Sunken eyes. ? Cold hands and feet. ? Confusion. ? Lack of energy (lethargy). ? Trouble waking up from sleep. ? Short-term weight loss. ? Unconsciousness. Follow these instructions at home:  If told by your doctor, drink an ORS: ? Make an ORS by using instructions on the package. ? Start by drinking small amounts, about  cup (120 mL) every 5-10 minutes. ? Slowly drink more until you have had the amount that your doctor said to have.  Drink enough clear fluid to keep your pee clear or pale yellow. If you were told to drink an ORS, finish the ORS  first, then start slowly drinking clear fluids. Drink fluids such as: ? Water. Do not drink only water by itself. Doing that can make the salt (sodium) level in your body get too low (hyponatremia). ? Ice chips. ? Fruit juice that you have added water to (diluted). ? Low-calorie sports drinks.  Avoid: ? Alcohol. ? Drinks that have a lot of sugar. These include high-calorie sports drinks, fruit juice that does not have water added, and soda. ? Caffeine. ? Foods that are greasy or have a lot of fat or sugar.  Take over-the-counter and prescription medicines only as told by your doctor.  Do not take salt tablets. Doing that can make the salt level in your body get too high (hypernatremia).  Eat foods that have minerals (electrolytes). Examples include bananas, oranges, potatoes, tomatoes, and spinach.  Keep all follow-up visits as told by your doctor. This is important. Contact a doctor if:  You have belly (abdominal) pain that: ? Gets worse. ? Stays in one area (localizes).  You have a rash.  You have a stiff neck.  You get angry or annoyed more easily than normal (irritability).  You are more sleepy than normal.  You have a harder time waking up than normal.  You feel: ? Weak. ? Dizzy. ? Very thirsty.  You have peed (urinated) only a small amount of very dark pee during 6-8 hours. Get help right away if:  You have symptoms of   very bad dehydration.  You cannot drink fluids without throwing up (vomiting).  Your symptoms get worse with treatment.  You have a fever.  You have a very bad headache.  You are throwing up or having watery poop (diarrhea) and it: ? Gets worse. ? Does not go away.  You have blood or something green (bile) in your throw-up.  You have blood in your poop (stool). This may cause poop to look black and tarry.  You have not peed in 6-8 hours.  You pass out (faint).  Your heart rate when you are sitting still is more than 100 beats a  minute.  You have trouble breathing. This information is not intended to replace advice given to you by your health care provider. Make sure you discuss any questions you have with your health care provider. Document Released: 05/01/2009 Document Revised: 01/23/2016 Document Reviewed: 08/29/2015 Elsevier Interactive Patient Education  2018 Elsevier Inc.  

## 2018-04-19 NOTE — Progress Notes (Signed)
[facility] Progress Note   Teresa Hays [id number] September 10, 1955 62 y.o.  Teresa Hays is managed by Dr. [name]  Actively treated with chemotherapy/immunotherapy: yes  Current Therapy: Abraxane, Perjeta, and Herceptin   Last Treated: 04/14/2017 (cycle 1, day 8)  Assessment: Plan:    Nausea and vomiting, intractability of vomiting not specified, unspecified vomiting type - Plan: 0.9 %  sodium chloride infusion, dexamethasone (DECADRON) injection 10 mg, ondansetron (ZOFRAN) injection 8 mg, heparin lock flush 100 unit/mL, sodium chloride flush (NS) 0.9 % injection 10 mL, DISCONTINUED: ondansetron (ZOFRAN) 8 mg, dexamethasone (DECADRON) 10 mg in sodium chloride 0.9 % 50 mL IVPB  Dehydration - Plan: 0.9 %  sodium chloride infusion  Metastatic breast cancer (HCC)   Nausea and vomiting: The patient was given Zofran 8 mg IV and Decadron 10 mg IV.  She reported that she was feeling better after this and was able to eat and drink without recurrent nausea or vomiting.  She has prescriptions for Ativan at home which I have told her that she could take sublingually if she continues to have nausea despite using Zofran.  She also has a prescription for Compazine at home.  I have reviewed with her that Marinol can work with Compazine to decrease nausea.  She is scheduled to see nutrition on 04/21/2018.  Dehydration: The patient was given 1 L of normal saline IV today.  Metastatic breast cancer: The patient is status post cycle 1, day 8 of Abraxane, Perjeta, and Herceptin.  She is scheduled to have her next infusion on 04/21/2018.  She will be seen in follow-up by Dr. [name] on 04/28/2018.  Please see After Visit Summary for patient specific instructions.  Future Appointments  Date Time Provider [place]  04/21/2018  9:00 AM [facility] ONLY [facility] None  04/21/2018  9:15 AM [facility] [place] FLUSH [facility] None  04/21/2018 10:00 AM [facility] INFUSION  [facility] None  04/21/2018 11:15 AM Neff, Barbara L, RD [facility] None  04/28/2018  8:45 AM [facility] LAB 5 [facility] None  04/28/2018  9:00 AM [facility] [place] FLUSH [facility] None  04/28/2018  9:30 AM [name], MD [facility] None  04/28/2018 10:00 AM [facility] INFUSION [facility] None  05/05/2018  9:00 AM [facility] ONLY [facility] None  05/05/2018  9:15 AM [facility] [place] FLUSH [facility] None  05/05/2018 10:00 AM [facility] INFUSION [facility] None  05/12/2018  8:45 AM [facility] LAB 4 [facility] None  05/12/2018  9:00 AM [facility] [place] FLUSH [facility] None  05/12/2018  9:30 AM [name], MD [facility] None  05/12/2018 10:15 AM [facility] INFUSION [facility] None    No orders of the defined types were placed in this encounter.      Subjective:   Patient ID:  Teresa Hays is a 62 y.o. (DOB 1955-09-30) female.  Chief Complaint: No chief complaint on file.   HPI Teresa Hays is a 62 year old female with a diagnosis of an ER, PR, and HER-2/neu positive metastatic breast cancer with liver metastasis.  The patient is managed by Dr. [name] and is status post cycle 1, day 8 of Abraxane, Perjeta, and Herceptin which is dosed on 04/14/2018.  She had a reaction to Taxol and was transitioned to Abraxane.  She reports having diarrhea last week and is now having anorexia, feeling cold, nausea and vomiting this morning, and facial flushing.  She took Zofran this morning and drink ginger ale.  She immediately thereafter vomited.  She denies fevers or sweats.   Medications:  I have reviewed the patient's current medications.  Allergies: No Known Allergies  Past Medical History:  Diagnosis Date  . GERD (gastroesophageal reflux disease)   . Hypertension     Past Surgical History:  Procedure Laterality Date  . COLONOSCOPY    . IR IMAGING GUIDED PORT INSERTION  04/04/2018    Family History  Problem Relation Age of Onset  . Diabetes Mother   . Cancer Sister  44       breast cancer  . Rheum arthritis Sister     Social History   Socioeconomic History  . Marital status: Single    Spouse name: Not on file  . Number of children: Not on file  . Years of education: Not on file  . Highest education level: Not on file  Occupational History  . Not on file  Social Needs  . Financial resource strain: Not on file  . Food insecurity:    Worry: Not on file    Inability: Not on file  . Transportation needs:    Medical: Not on file    Non-medical: Not on file  Tobacco Use  . Smoking status: Never Smoker  . Smokeless tobacco: Never Used  Substance and Sexual Activity  . Alcohol use: No  . Drug use: Not on file  . Sexual activity: Not on file  Lifestyle  . Physical activity:    Days per week: Not on file    Minutes per session: Not on file  . Stress: Not on file  Relationships  . Social connections:    Talks on phone: Not on file    Gets together: Not on file    Attends religious service: Not on file    Active member of club or organization: Not on file    Attends meetings of clubs or organizations: Not on file    Relationship status: Not on file  . Intimate partner violence:    Fear of current or ex partner: Not on file    Emotionally abused: Not on file    Physically abused: Not on file    Forced sexual activity: Not on file  Other Topics Concern  . Not on file  Social History Narrative  . Not on file    Past Medical History, Surgical history, Social history, and Family history were reviewed and updated as appropriate.   Please see review of systems for further details on the patient's review from today.   Review of Systems:  Review of Systems  Constitutional: Positive for appetite change and chills. Negative for diaphoresis and fever.  Respiratory: Positive for cough. Negative for choking, shortness of breath and wheezing.   Cardiovascular: Negative for chest pain and palpitations.  Gastrointestinal: Positive for diarrhea,  nausea and vomiting. Negative for constipation.  Genitourinary: Negative for decreased urine volume, difficulty urinating and dysuria.  Neurological: Negative for headaches.    Objective:   Physical Exam:  BP 125/90   Pulse 87   Temp 97.6 F (36.4 C) (Oral)   Resp 18   SpO2 99%  ECOG: 1  Physical Exam  Constitutional: No distress.  HENT:  Head: Normocephalic and atraumatic.  Eyes: Right eye exhibits no discharge. Left eye exhibits no discharge. No scleral icterus.  Neck: Normal range of motion. Neck supple. No tracheal deviation present. No thyromegaly present.  Cardiovascular: Normal rate, regular rhythm and normal heart sounds. Exam reveals no gallop and no friction rub.  No murmur heard. Pulmonary/Chest: Effort normal and breath sounds normal. No respiratory  distress. She has no wheezes. She has no rales.  Lymphadenopathy:    She has no cervical adenopathy.  Neurological: She is alert. Coordination normal.  Skin: Skin is warm and dry. No rash noted. She is not diaphoretic. No erythema.  Psychiatric: She has a normal mood and affect. Her behavior is normal. Judgment and thought content normal.    Lab Review:     Component Value Date/Time   NA 133 (L) 04/18/2018 1405   K 3.5 04/18/2018 1405   CL 94 (L) 04/18/2018 1405   CO2 27 04/18/2018 1405   GLUCOSE 112 (H) 04/18/2018 1405   BUN 27 (H) 04/18/2018 1405   CREATININE 1.38 (H) 04/18/2018 1405   CALCIUM 9.6 04/18/2018 1405   PROT 7.5 04/18/2018 1405   ALBUMIN 3.6 04/18/2018 1405   AST 304 (HH) 04/18/2018 1405   ALT 118 (H) 04/18/2018 1405   ALKPHOS 362 (H) 04/18/2018 1405   BILITOT 0.8 04/18/2018 1405   GFRNONAA 40 (L) 04/18/2018 1405   GFRAA 47 (L) 04/18/2018 1405       Component Value Date/Time   WBC 6.3 04/18/2018 1405   WBC 7.2 04/04/2018 1037   RBC 4.48 04/18/2018 1405   HGB 12.4 04/18/2018 1405   HCT 37.6 04/18/2018 1405   PLT 193 04/18/2018 1405   MCV 83.9 04/18/2018 1405   MCH 27.7 04/18/2018 1405     MCHC 33.1 04/18/2018 1405   RDW 14.3 04/18/2018 1405   LYMPHSABS 0.9 04/18/2018 1405   MONOABS 0.1 04/18/2018 1405   EOSABS 0.0 04/18/2018 1405   BASOSABS 0.1 04/18/2018 1405   -------------------------------  Imaging from last 24 hours (if applicable):  Radiology interpretation: Ct Chest Wo Contrast  Result Date: 03/21/2018 CLINICAL DATA:  Liver metastasis. Evaluate for metastatic disease within the chest. EXAM: CT CHEST WITHOUT CONTRAST TECHNIQUE: Multidetector CT imaging of the chest was performed following the standard protocol without IV contrast. COMPARISON:  03/15/2018 FINDINGS: Cardiovascular: The heart size is normal. Mild aortic atherosclerosis. Mediastinum/Nodes: Normal appearance of the thyroid gland. The trachea appears patent and is midline. Normal appearance of the esophagus. No enlarged mediastinal or hilar lymph nodes. Multiple enlarged right axillary lymph nodes identified. The largest measures 1.8 cm, image 56/3. Retropectoral lymph node measures 1 cm, image 34/3. Lungs/Pleura: No pleural effusion identified. No airspace consolidation or atelectasis. Nonspecific nodule within the lateral right apex measures 3 mm, image 22/4. Upper Abdomen: Extensive liver metastases are again noted throughout the liver. 1.2 cm low-density right adrenal gland nodule measures -4.5 HU compatible with a benign abnormality. Musculoskeletal: No chest wall mass or suspicious bone lesions identified. IMPRESSION: 1. Enlarged right axillary and subpectoral lymph nodes are identified. In the setting of a right breast mass findings may represent metastatic adenopathy. These should be amendable to percutaneous tissue sampling under ultrasound guidance. 2. Diffuse liver metastasis 3. Tiny nodule within the lateral right apex is nonspecific measuring 3 mm. No follow-up needed if patient is low-risk. Non-contrast chest CT can be considered in 12 months if patient is high-risk. This recommendation follows the  consensus statement: Guidelines for Management of Incidental Pulmonary Nodules Detected on CT Images: From the Fleischner Society 2017; Radiology 2017; 284:228-243. 4.  Aortic Atherosclerosis (ICD10-I70.0). Electronically Signed   By: Kerby Moors M.D.   On: 03/21/2018 12:28   Nm Bone Scan Whole Body  Result Date: 03/30/2018 CLINICAL DATA:  Breast cancer, liver metastases EXAM: NUCLEAR MEDICINE WHOLE BODY BONE SCAN TECHNIQUE: Whole body anterior and posterior images were obtained approximately 3  hours after intravenous injection of radiopharmaceutical. RADIOPHARMACEUTICALS:  21 mCi Technetium-27mMDP IV COMPARISON:  None Correlation: CT abdomen and pelvis 03/15/2018, CT chest 03/21/2018 FINDINGS: Uptake at the shoulders, typically degenerative. No additional sites of abnormal osseous tracer accumulation are identified to suggest osseous metastatic disease. Nonspecific increased tracer localization at both breasts. Additionally, areas of abnormal soft tissue localization of tracer is seen in the mid abdomen, likely within hepatic metastases as demonstrated on prior CT. No additional sites of abnormal soft tissue localization of tracer. Expected urinary tract localization of tracer seen. IMPRESSION: No scintigraphic evidence of osseous metastatic disease. Abnormal soft tissue localization of tracer in the upper mid abdomen, likely within hepatic metastases as identified by recent CT. Electronically Signed   By: MLavonia DanaM.D.   On: 03/30/2018 02:47   [place]  Result Date: 03/27/2018 CLINICAL DATA:  Breast carcinoma.  Liver lesions. EXAM: ULTRASOUND GUIDED CORE BIOPSY OF LIVER LESION MEDICATIONS: Intravenous Fentanyl and Versed were administered as conscious sedation during continuous monitoring of the patient's level of consciousness and physiological / cardiorespiratory status by the radiology RN, with a total moderate sedation time of 15 minutes. PROCEDURE: The procedure, risks, benefits, and  alternatives were explained to the patient. Questions regarding the procedure were encouraged and answered. The patient understands and consents to the procedure. Survey ultrasound of the liver was performed. A representative lesion in the right lobe was localized, and an appropriate skin entry site was determined and marked. The operative field was prepped with chlorhexidine in a sterile fashion, and a sterile drape was applied covering the operative field. A sterile gown and sterile gloves were used for the procedure. Local anesthesia was provided with 1% Lidocaine. Under real-time ultrasound guidance, a 17 gauge trocar needle was advanced to the margin of the lesion. Once needle tip position was confirmed, coaxial 18-gauge core biopsy samples were obtained, submitted in formalin to surgical pathology. The guide needle was removed. Postprocedure scans show no hemorrhage or other apparent complication. The patient tolerated the procedure well. COMPLICATIONS: None. FINDINGS: Multiple liver lesions are identified corresponding to the lesions seen on prior CT. Representative core biopsies obtained as above. IMPRESSION: 1. Technically successful ultrasound-guided core biopsy, liver lesion Electronically Signed   By: DLucrezia EuropeM.D.   On: 03/27/2018 09:50   [place]Breast Ltd Uni Left Inc Axilla  Result Date: 03/22/2018 CLINICAL DATA:  62year old female with palpable RIGHT breast mass discovered on clinical examination. EXAM: DIGITAL DIAGNOSTIC BILATERAL MAMMOGRAM WITH CAD AND TOMO ULTRASOUND BILATERAL BREAST COMPARISON:  None ACR Breast Density Category b: There are scattered areas of fibroglandular density. FINDINGS: 2D/3D full field views of both breasts demonstrate the following: A circumscribed oval mass within the UPPER-OUTER RIGHT breast. A spiculated mass containing calcifications within the UPPER-OUTER RIGHT breast. Mass and calcifications span a distance of 4 cm. Two 6 mm partially circumscribed partially  obscured masses within the LEFT breast, 1 within the UPPER-OUTER LEFT breast and 1 within the INNER LEFT breast. Mammographic images were processed with CAD. On physical exam, a firm palpable mass is identified at the 9 o'clock position of the RIGHT breast 7 cm from the nipple. Targeted ultrasound is performed, showing the following: RIGHT breast and axilla: A 1.4 x 1.6 x 2.3 cm irregular hypoechoic mass at the 11:30 position 2 cm from the nipple. A 2.1 x 1.6 x 2.5 cm hypoechoic solid mass at the 9 o'clock position 7 cm from the nipple, corresponding to the patient's palpable abnormality and may  represent an enlarged abnormal intramammary lymph node. Five enlarged RIGHT axillary lymph nodes with thickened cortices. LEFT breast and axilla: A 0.7 x 0.4 x 0.6 cm circumscribed oval hypoechoic parallel mass at the 9 o'clock position 3 cm from the nipple. A 0.9 x 0.3 x 0.7 cm circumscribed oval hypoechoic parallel mass at the 2 o'clock position 3 cm from the nipple. No abnormal LEFT axillary lymph nodes are identified. IMPRESSION: 1. Highly suspicious 2.3 cm UPPER-OUTER RIGHT breast mass (entire area measuring 4 cm mammographically with associated calcifications), 2.5 cm solid mass/abnormal intraparenchymal lymph node within the OUTER RIGHT breast and 5 abnormal RIGHT axillary lymph nodes. Tissue sampling recommended. 2. Indeterminate 0.7 cm INNER LEFT breast mass and 0.9 cm UPPER-OUTER LEFT breast mass. Given RIGHT breast findings, tissue sampling recommended. No abnormal LEFT axillary lymph nodes. RECOMMENDATION: 1. Three ultrasound-guided biopsies of the RIGHT breast - UPPER-OUTER RIGHT breast mass, OUTER RIGHT breast mass and 1 of the enlarged RIGHT axillary lymph nodes. 2. Two ultrasound-guided biopsies of the LEFT breast - masses as described. These biopsies will be arranged. I have discussed the findings and recommendations with the patient. Results were also provided in writing at the conclusion of the visit. If  applicable, a reminder letter will be sent to the patient regarding the next appointment. BI-RADS CATEGORY  5: Highly suggestive of malignancy. Electronically Signed   By: Margarette Canada M.D.   On: 03/22/2018 12:11   US Breast Ltd Uni Right Inc Axilla  Result Date: 03/22/2018 CLINICAL DATA:  62 year old female with palpable RIGHT breast mass discovered on clinical examination. EXAM: DIGITAL DIAGNOSTIC BILATERAL MAMMOGRAM WITH CAD AND TOMO ULTRASOUND BILATERAL BREAST COMPARISON:  None ACR Breast Density Category b: There are scattered areas of fibroglandular density. FINDINGS: 2D/3D full field views of both breasts demonstrate the following: A circumscribed oval mass within the UPPER-OUTER RIGHT breast. A spiculated mass containing calcifications within the UPPER-OUTER RIGHT breast. Mass and calcifications span a distance of 4 cm. Two 6 mm partially circumscribed partially obscured masses within the LEFT breast, 1 within the UPPER-OUTER LEFT breast and 1 within the INNER LEFT breast. Mammographic images were processed with CAD. On physical exam, a firm palpable mass is identified at the 9 o'clock position of the RIGHT breast 7 cm from the nipple. Targeted ultrasound is performed, showing the following: RIGHT breast and axilla: A 1.4 x 1.6 x 2.3 cm irregular hypoechoic mass at the 11:30 position 2 cm from the nipple. A 2.1 x 1.6 x 2.5 cm hypoechoic solid mass at the 9 o'clock position 7 cm from the nipple, corresponding to the patient's palpable abnormality and may represent an enlarged abnormal intramammary lymph node. Five enlarged RIGHT axillary lymph nodes with thickened cortices. LEFT breast and axilla: A 0.7 x 0.4 x 0.6 cm circumscribed oval hypoechoic parallel mass at the 9 o'clock position 3 cm from the nipple. A 0.9 x 0.3 x 0.7 cm circumscribed oval hypoechoic parallel mass at the 2 o'clock position 3 cm from the nipple. No abnormal LEFT axillary lymph nodes are identified. IMPRESSION: 1. Highly suspicious  2.3 cm UPPER-OUTER RIGHT breast mass (entire area measuring 4 cm mammographically with associated calcifications), 2.5 cm solid mass/abnormal intraparenchymal lymph node within the OUTER RIGHT breast and 5 abnormal RIGHT axillary lymph nodes. Tissue sampling recommended. 2. Indeterminate 0.7 cm INNER LEFT breast mass and 0.9 cm UPPER-OUTER LEFT breast mass. Given RIGHT breast findings, tissue sampling recommended. No abnormal LEFT axillary lymph nodes. RECOMMENDATION: 1. Three ultrasound-guided biopsies of the RIGHT  breast - UPPER-OUTER RIGHT breast mass, OUTER RIGHT breast mass and 1 of the enlarged RIGHT axillary lymph nodes. 2. Two ultrasound-guided biopsies of the LEFT breast - masses as described. These biopsies will be arranged. I have discussed the findings and recommendations with the patient. Results were also provided in writing at the conclusion of the visit. If applicable, a reminder letter will be sent to the patient regarding the next appointment. BI-RADS CATEGORY  5: Highly suggestive of malignancy. Electronically Signed   By: Margarette Canada M.D.   On: 03/22/2018 12:11   Mm Diag Breast Tomo Bilateral  Result Date: 03/22/2018 CLINICAL DATA:  62 year old female with palpable RIGHT breast mass discovered on clinical examination. EXAM: DIGITAL DIAGNOSTIC BILATERAL MAMMOGRAM WITH CAD AND TOMO ULTRASOUND BILATERAL BREAST COMPARISON:  None ACR Breast Density Category b: There are scattered areas of fibroglandular density. FINDINGS: 2D/3D full field views of both breasts demonstrate the following: A circumscribed oval mass within the UPPER-OUTER RIGHT breast. A spiculated mass containing calcifications within the UPPER-OUTER RIGHT breast. Mass and calcifications span a distance of 4 cm. Two 6 mm partially circumscribed partially obscured masses within the LEFT breast, 1 within the UPPER-OUTER LEFT breast and 1 within the INNER LEFT breast. Mammographic images were processed with CAD. On physical exam, a firm  palpable mass is identified at the 9 o'clock position of the RIGHT breast 7 cm from the nipple. Targeted ultrasound is performed, showing the following: RIGHT breast and axilla: A 1.4 x 1.6 x 2.3 cm irregular hypoechoic mass at the 11:30 position 2 cm from the nipple. A 2.1 x 1.6 x 2.5 cm hypoechoic solid mass at the 9 o'clock position 7 cm from the nipple, corresponding to the patient's palpable abnormality and may represent an enlarged abnormal intramammary lymph node. Five enlarged RIGHT axillary lymph nodes with thickened cortices. LEFT breast and axilla: A 0.7 x 0.4 x 0.6 cm circumscribed oval hypoechoic parallel mass at the 9 o'clock position 3 cm from the nipple. A 0.9 x 0.3 x 0.7 cm circumscribed oval hypoechoic parallel mass at the 2 o'clock position 3 cm from the nipple. No abnormal LEFT axillary lymph nodes are identified. IMPRESSION: 1. Highly suspicious 2.3 cm UPPER-OUTER RIGHT breast mass (entire area measuring 4 cm mammographically with associated calcifications), 2.5 cm solid mass/abnormal intraparenchymal lymph node within the OUTER RIGHT breast and 5 abnormal RIGHT axillary lymph nodes. Tissue sampling recommended. 2. Indeterminate 0.7 cm INNER LEFT breast mass and 0.9 cm UPPER-OUTER LEFT breast mass. Given RIGHT breast findings, tissue sampling recommended. No abnormal LEFT axillary lymph nodes. RECOMMENDATION: 1. Three ultrasound-guided biopsies of the RIGHT breast - UPPER-OUTER RIGHT breast mass, OUTER RIGHT breast mass and 1 of the enlarged RIGHT axillary lymph nodes. 2. Two ultrasound-guided biopsies of the LEFT breast - masses as described. These biopsies will be arranged. I have discussed the findings and recommendations with the patient. Results were also provided in writing at the conclusion of the visit. If applicable, a reminder letter will be sent to the patient regarding the next appointment. BI-RADS CATEGORY  5: Highly suggestive of malignancy. Electronically Signed   By: Margarette Canada  M.D.   On: 03/22/2018 12:11   [place] Axillary Node Core Biopsy Right  Addendum Date: 03/27/2018   ADDENDUM REPORT: 03/27/2018 15:06 ADDENDUM: Pathology revealed GRADE II INVASIVE DUCTAL CARCINOMA, DUCTAL CARCINOMA IN SITU of the RIGHT breast, 11:30 o'clock, 2 cm from nipple. This was found to be concordant by Dr. Peggye Fothergill. 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 This was found to be concordant by Dr. Peggye Fothergill. 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 This was found to be concordant by Dr. Peggye Fothergill. Pathology results were discussed with the patient by telephone. The patient reported doing well after the biopsies with tenderness at the sites. Post biopsy instructions and care were reviewed and questions were answered. The patient was encouraged to call [facility] for any additional concerns. Pathology results were discussed with Dr. [name] on March 27, 2018. Dr. Burr Medico stated she would arrange a surgical referral for the patient at the appropriate time. The patient has an appointment to see Dr. Burr Medico on March 30, 2018. The patient underwent ultrasound-guided core needle biopsy of the liver on March 28, 2018 at [facility], and she is scheduled for ultrasound guided biopsy of 2 LEFT breast masses on March 29, 2018 at [facility] Pathology results reported by Terie Purser, RN on 03/27/2018. Electronically Signed   By: Evangeline Dakin M.D.   On: 03/27/2018 15:06   Result Date: 03/27/2018 CLINICAL DATA:  62 year old who presented with a palpable lump in the OUTER RIGHT breast which was shown on diagnostic workup to represent an approximate 2.5 cm suspicious mass at the 9 o'clock position approximately 7 cm from the nipple. Diagnostic workup demonstrated a second highly suspicious 2.3 cm mass at the 11:30 o'clock position approximately 2 cm from the nipple associated with architectural  distortion. A total of 5 abnormal lymph nodes were identified in the RIGHT axilla on the diagnostic ultrasound. Biopsy of the 2 breast masses and a RIGHT axillary node is performed. Of note, a recent CT of the abdomen and pelvis demonstrated innumerable liver metastases and a possible mass involving the rectum. EXAM: ULTRASOUND GUIDED RIGHT BREAST CORE NEEDLE BIOPSY x 2 ULTRASOUND GUIDED RIGHT AXILLARY NODE CORE NEEDLE BIOPSY COMPARISON:  Previous exam(s). FINDINGS: I met with the patient and we discussed the procedure of ultrasound-guided biopsy, including benefits and alternatives. We discussed the high likelihood of a successful procedure. We discussed the risks of the procedure, including infection, bleeding, tissue injury, clip migration, and inadequate sampling. Informed written consent was given. The usual time-out protocol was performed immediately prior to the procedure. Lesion quadrant (labeled # 1 for pathology, 11:30 o'clock position approximately 2 cm from the nipple): UPPER OUTER QUADRANT. Using sterile technique with chlorhexidine as skin antisepsis, 1% lidocaine and 1% lidocaine with epinephrine as local anesthetic, under direct ultrasound visualization, a 12 gauge Bard Marquee core needle device was used to initially perform biopsy of the suspicious mass in the UPPER OUTER QUADRANT at the 11:30 o'clock position approximately 2 cm from the nipple using a LATERAL approach. At the conclusion of the procedure a ribbon shaped tissue marker clip was deployed into the biopsy cavity. Lesion quadrant (labeled # 2 for pathology, 9 o'clock position approximately 7 cm from the nipple): Slight LOWER OUTER QUADRANT. Subsequently, using sterile technique with chlorhexidine as skin antisepsis, 1% lidocaine and 1% lidocaine with epinephrine as local anesthetic, under direct ultrasound visualization, a 12 gauge Bard Marquee core needle device was used to perform biopsy of the palpable mass in the OUTER RIGHT breast  at the 9 o'clock position approximately 7 cm from the nipple using a LATERAL approach. At the conclusion of the procedure a coil shaped tissue marker clip was deployed into the biopsy cavity. Finally, using sterile technique with chlorhexidine as skin antisepsis, 1% lidocaine  and 1% lidocaine with epinephrine as local anesthetic, under direct ultrasound visualization, a 14 gauge Bard Marquee core needle device was used to perform biopsy of 1 of the pathologic RIGHT axillary lymph nodes using a LATERAL approach. At the conclusion of the procedure a spiral shaped HydroMark tissue marker clip was deployed into the biopsy cavity. The RIGHT axilla biopsy samples are labeled # 3 for pathology. Follow-up two-view mammogram was performed and is dictated separately. IMPRESSION: Ultrasound guided biopsy of 2 suspicious masses involving the RIGHT breast and a pathologic RIGHT axillary lymph node. No apparent complications. Electronically Signed: By: Evangeline Dakin M.D. On: 03/24/2018 16:31   Mm Clip Placement Left  Result Date: 03/30/2018 CLINICAL DATA:  Post biopsy mammogram of the left breast for clip placement. EXAM: DIAGNOSTIC LEFT MAMMOGRAM POST STEREOTACTIC BIOPSY COMPARISON:  Previous exam(s). FINDINGS: Mammographic images were obtained following stereotactic guided biopsy of a mass in the upper-outer left breast. The X shaped biopsy marking clip is well positioned at the site of biopsy in the upper-outer left breast. IMPRESSION: Appropriate positioning of the X shaped biopsy marking clip in the upper-outer left breast. Final Assessment: Post Procedure Mammograms for Marker Placement Electronically Signed   By: Ammie Ferrier M.D.   On: 03/30/2018 10:43   Mm Clip Placement Left  Result Date: 03/28/2018 CLINICAL DATA:  Patient status post ultrasound-guided core needle biopsy sonographically detected left breast masses 2 o'clock and 9 o'clock position. EXAM: DIAGNOSTIC LEFT MAMMOGRAM POST ULTRASOUND BIOPSY  COMPARISON:  Previous exam(s). FINDINGS: Mammographic images were obtained following ultrasound guided biopsy of left breast masses 2 o'clock and 9 o'clock position. Left breast mass 9 o'clock position: Ribbon shaped marking clip: In appropriate position. Left breast mass 2 o'clock position: Coil shaped marking clip: In appropriate position. Note that the mammographically identified mass within the lateral posterior left breast does not correspond with the sonographically identified and biopsied mass within the left breast 2 o'clock position. IMPRESSION: Biopsy marking clips as above. Note that the mammographically identified mass within the lateral left breast does not correspond with the sonographically identified and biopsied mass within the left breast 2 o'clock position. Patient will need additional stereotactic guided core needle biopsy of the mammographically identified mass within the lateral aspect of the left breast posterior depth. Patient will be scheduled for this procedure. This was discussed with the patient after the biopsy. Final Assessment: Post Procedure Mammograms for Marker Placement Electronically Signed   By: Lovey Newcomer M.D.   On: 03/28/2018 15:58   Mm Clip Placement Right  Result Date: 03/24/2018 CLINICAL DATA:  Confirmation of clip placement after ultrasound-guided core needle biopsy of 2 suspicious masses in the RIGHT breast and a pathologic RIGHT axillary lymph node. EXAM: DIAGNOSTIC RIGHT MAMMOGRAM POST ULTRASOUND BIOPSY COMPARISON:  Previous exam(s). FINDINGS: Mammographic images were obtained following ultrasound-guided core needle biopsy of 2 masses involving the RIGHT breast and a pathologic RIGHT axillary lymph node. The ribbon shaped tissue marker clip is appropriately positioned within the highly suspicious mass associated with architectural distortion involving the UPPER OUTER QUADRANT at MIDDLE depth at the 11:30 o'clock position. The LATERAL extent of the adjacent suspicious  calcifications associated with this mass are approximately 2.5 cm LATERAL to the ribbon shaped clip. The coil shaped tissue marker clip is appropriately position within the suspicious palpable mass in the OUTER LEFT breast at MIDDLE depth at the 9 o'clock position. The spiral shaped HydroMark clip placed into the RIGHT axillary lymph node is not visible due to the  depth of the lymph node. The distance between the ribbon clip and the coil clip is approximately 6.3 cm. The masses nearly overlap each another on the mediolateral and MLO views. Expected post biopsy changes are present without evidence of hematoma. IMPRESSION: 1. Appropriate positioning of the ribbon shaped tissue marker clip within the mass associated with architectural distortion and suspicious calcifications in the UPPER OUTER QUADRANT of the RIGHT breast at MIDDLE depth, 11:30 o'clock position 2 cm from the nipple. The calcifications extend approximately 2.5 cm LATERAL to the ribbon clip. 2. Appropriate positioning of the coil shaped tissue marker clip within the palpable mass in the OUTER RIGHT breast at MIDDLE depth, 9 o'clock position 7 cm from the nipple. 3. The distance between the ribbon clip in the coil clip is approximately 6.3 cm. Final Assessment: Post Procedure Mammograms for Marker Placement Electronically Signed   By: Evangeline Dakin M.D.   On: 03/24/2018 16:38   Mm Lt Breast Bx W Loc Dev 1st Lesion Image Bx Spec Stereo Guide  Addendum Date: 04/03/2018   ADDENDUM REPORT: 04/03/2018 07:38 ADDENDUM: Pathology revealed BENIGN BREAST TISSUE WITH MILD FIBROCYSTIC CHANGE of the Left breast, upper outer quadrant. This was found to be concordant by Dr. Ammie Ferrier. Pathology results were discussed with the patient by telephone. The patient reported doing well after the biopsy with tenderness at the site. Post biopsy instructions and care were reviewed and questions were answered. The patient was encouraged to call [facility] for any additional concerns. The patient has a recent diagnosis of right breast cancer and should follow her outlined treatment plan. Dr. [name] was notified of biopsy results via EPIC message on March 31, 2018. Pathology results reported by Terie Purser, RN on 04/03/2018. Electronically Signed   By: Ammie Ferrier M.D.   On: 04/03/2018 07:38   Result Date: 04/03/2018 CLINICAL DATA:  62 year old female presenting for stereotactic biopsy of a left breast mass. EXAM: LEFT BREAST STEREOTACTIC CORE NEEDLE BIOPSY COMPARISON:  Previous exams. FINDINGS: The patient and I discussed the procedure of stereotactic-guided biopsy including benefits and alternatives. We discussed the high likelihood of a successful procedure. We discussed the risks of the procedure including infection, bleeding, tissue injury, clip migration, and inadequate sampling. Informed written consent was given. The usual time out protocol was performed immediately prior to the procedure. Using sterile technique and 1% Lidocaine as local anesthetic, under stereotactic guidance, a 9 gauge vacuum assisted device was used to perform core needle biopsy of a mass in the upper-outer quadrant of the left breast using a superior approach. Lesion quadrant: Upper-outer quadrant At the conclusion of the procedure, a X shaped tissue marker clip was deployed into the biopsy cavity. Follow-up 2-view mammogram was performed and dictated separately. IMPRESSION: Stereotactic-guided biopsy of a mass in the left breast in the upper-outer quadrant. No apparent complications. Electronically Signed: By: Ammie Ferrier M.D. On: 03/30/2018 10:42   [place] Lt Breast Bx W Loc Dev 1st Lesion Img Bx Spec US Guide  Addendum Date: 03/30/2018   ADDENDUM REPORT: 03/29/2018 14:17 ADDENDUM: Pathology revealed FIBROCYSTIC CHANGES of the Left breast, 9 o'clock. FIBROADENOMA of the Left breast, 2 o'clock. This was found to be concordant by Dr. Lovey Newcomer.  Pathology results were discussed with the patient by telephone. The patient reported doing well after the biopsies with tenderness at the sites. Post biopsy instructions and care were reviewed and questions were answered. The patient was encouraged to call [facility] [place]  [facility] for any additional concerns. The patient is scheduled for a Left breast stereotatic biopsy at the [facility] on March 30, 2018. The patient has a recent diagnosis of right breast cancer and should follow her outlined treatment plan. Pathology results reported by Terie Purser, RN on 03/29/2018. Electronically Signed   By: Lovey Newcomer M.D.   On: 03/29/2018 14:17   Result Date: 03/30/2018 CLINICAL DATA:  Patient with indeterminate left breast mass 9 o'clock position. EXAM: ULTRASOUND GUIDED LEFT BREAST CORE NEEDLE BIOPSY COMPARISON:  Previous exam(s). FINDINGS: I met with the patient and we discussed the procedure of ultrasound-guided biopsy, including benefits and alternatives. We discussed the high likelihood of a successful procedure. We discussed the risks of the procedure, including infection, bleeding, tissue injury, clip migration, and inadequate sampling. Informed written consent was given. The usual time-out protocol was performed immediately prior to the procedure. Lesion quadrant: Upper inner quadrant Using sterile technique and 1% Lidocaine as local anesthetic, under direct ultrasound visualization, a 12 gauge spring-loaded device was used to perform biopsy of left breast mass 9 o'clock position using a medial approach. At the conclusion of the procedure a ribbon shaped tissue marker clip was deployed into the biopsy cavity. Follow up 2 view mammogram was performed and dictated separately. IMPRESSION: Ultrasound guided biopsy of left breast mass 9 o'clock position. No apparent complications. Electronically Signed: By: Lovey Newcomer M.D. On: 03/28/2018 15:54   [place] Lt Breast Bx W Loc Dev Ea Add Lesion Img Bx  Spec US Guide  Addendum Date: 03/30/2018   ADDENDUM REPORT: 03/29/2018 14:17 ADDENDUM: Pathology revealed FIBROCYSTIC CHANGES of the Left breast, 9 o'clock. FIBROADENOMA of the Left breast, 2 o'clock. This was found to be concordant by Dr. Lovey Newcomer. Pathology results were discussed with the patient by telephone. The patient reported doing well after the biopsies with tenderness at the sites. Post biopsy instructions and care were reviewed and questions were answered. The patient was encouraged to call [facility] for any additional concerns. The patient is scheduled for a Left breast stereotatic biopsy at the [facility] on March 30, 2018. The patient has a recent diagnosis of right breast cancer and should follow her outlined treatment plan. Pathology results reported by Terie Purser, RN on 03/29/2018. Electronically Signed   By: Lovey Newcomer M.D.   On: 03/29/2018 14:17   Result Date: 03/30/2018 CLINICAL DATA:  Indeterminate left breast mass 2 o'clock position. EXAM: ULTRASOUND GUIDED LEFT BREAST CORE NEEDLE BIOPSY COMPARISON:  Previous exam(s). FINDINGS: I met with the patient and we discussed the procedure of ultrasound-guided biopsy, including benefits and alternatives. We discussed the high likelihood of a successful procedure. We discussed the risks of the procedure, including infection, bleeding, tissue injury, clip migration, and inadequate sampling. Informed written consent was given. The usual time-out protocol was performed immediately prior to the procedure. Lesion quadrant: Upper outer quadrant Using sterile technique and 1% Lidocaine as local anesthetic, under direct ultrasound visualization, a 12 gauge spring-loaded device was used to perform biopsy of left breast mass 2 o'clock position using a lateral approach. At the conclusion of the procedure a coil shaped tissue marker clip was deployed into the biopsy cavity. Follow up 2 view mammogram was performed and  dictated separately. IMPRESSION: Ultrasound guided biopsy of left breast mass 2 o'clock position. No apparent complications. Electronically Signed: By: Lovey Newcomer M.D. On: 03/28/2018 15:55   [place] Rt Breast Bx W Loc Dev 1st Lesion Img Bx  Spec US Guide  Addendum Date: 03/27/2018   ADDENDUM REPORT: 03/27/2018 15:06 ADDENDUM: Pathology revealed GRADE II INVASIVE DUCTAL CARCINOMA, DUCTAL CARCINOMA IN SITU of the RIGHT breast, 11:30 o'clock, 2 cm from nipple. This was found to be concordant by Dr. Peggye Fothergill. 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 This was found to be concordant by Dr. Peggye Fothergill. 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 This was found to be concordant by Dr. Peggye Fothergill. Pathology results were discussed with the patient by telephone. The patient reported doing well after the biopsies with tenderness at the sites. Post biopsy instructions and care were reviewed and questions were answered. The patient was encouraged to call [facility] for any additional concerns. Pathology results were discussed with Dr. [name] on March 27, 2018. Dr. Burr Medico stated she would arrange a surgical referral for the patient at the appropriate time. The patient has an appointment to see Dr. Burr Medico on March 30, 2018. The patient underwent ultrasound-guided core needle biopsy of the liver on March 28, 2018 at [facility], and she is scheduled for ultrasound guided biopsy of 2 LEFT breast masses on March 29, 2018 at [facility]. Pathology results reported by Terie Purser, RN on 03/27/2018. Electronically Signed   By: Evangeline Dakin M.D.   On: 03/27/2018 15:06   Result Date: 03/27/2018 CLINICAL DATA:  62 year old who presented with a palpable lump in the OUTER RIGHT breast which was shown on diagnostic workup to represent an approximate 2.5 cm suspicious mass at the 9 o'clock position approximately 7 cm  from the nipple. Diagnostic workup demonstrated a second highly suspicious 2.3 cm mass at the 11:30 o'clock position approximately 2 cm from the nipple associated with architectural distortion. A total of 5 abnormal lymph nodes were identified in the RIGHT axilla on the diagnostic ultrasound. Biopsy of the 2 breast masses and a RIGHT axillary node is performed. Of note, a recent CT of the abdomen and pelvis demonstrated innumerable liver metastases and a possible mass involving the rectum. EXAM: ULTRASOUND GUIDED RIGHT BREAST CORE NEEDLE BIOPSY x 2 ULTRASOUND GUIDED RIGHT AXILLARY NODE CORE NEEDLE BIOPSY COMPARISON:  Previous exam(s). FINDINGS: I met with the patient and we discussed the procedure of ultrasound-guided biopsy, including benefits and alternatives. We discussed the high likelihood of a successful procedure. We discussed the risks of the procedure, including infection, bleeding, tissue injury, clip migration, and inadequate sampling. Informed written consent was given. The usual time-out protocol was performed immediately prior to the procedure. Lesion quadrant (labeled # 1 for pathology, 11:30 o'clock position approximately 2 cm from the nipple): UPPER OUTER QUADRANT. Using sterile technique with chlorhexidine as skin antisepsis, 1% lidocaine and 1% lidocaine with epinephrine as local anesthetic, under direct ultrasound visualization, a 12 gauge Bard Marquee core needle device was used to initially perform biopsy of the suspicious mass in the UPPER OUTER QUADRANT at the 11:30 o'clock position approximately 2 cm from the nipple using a LATERAL approach. At the conclusion of the procedure a ribbon shaped tissue marker clip was deployed into the biopsy cavity. Lesion quadrant (labeled # 2 for pathology, 9 o'clock position approximately 7 cm from the nipple): Slight LOWER OUTER QUADRANT. Subsequently, using sterile technique with chlorhexidine as skin antisepsis, 1% lidocaine and 1% lidocaine with  epinephrine as local anesthetic, under direct ultrasound visualization, a 12 gauge Bard Marquee core needle device was used to perform biopsy of the  palpable mass in the OUTER RIGHT breast at the 9 o'clock position approximately 7 cm from the nipple using a LATERAL approach. At the conclusion of the procedure a coil shaped tissue marker clip was deployed into the biopsy cavity. Finally, using sterile technique with chlorhexidine as skin antisepsis, 1% lidocaine and 1% lidocaine with epinephrine as local anesthetic, under direct ultrasound visualization, a 14 gauge Bard Marquee core needle device was used to perform biopsy of 1 of the pathologic RIGHT axillary lymph nodes using a LATERAL approach. At the conclusion of the procedure a spiral shaped HydroMark tissue marker clip was deployed into the biopsy cavity. The RIGHT axilla biopsy samples are labeled # 3 for pathology. Follow-up two-view mammogram was performed and is dictated separately. IMPRESSION: Ultrasound guided biopsy of 2 suspicious masses involving the RIGHT breast and a pathologic RIGHT axillary lymph node. No apparent complications. Electronically Signed: By: Evangeline Dakin M.D. On: 03/24/2018 16:31   [place] Rt Breast Bx W Loc Dev Ea Add Lesion Img Bx Spec US Guide  Addendum Date: 03/27/2018   ADDENDUM REPORT: 03/27/2018 15:06 ADDENDUM: Pathology revealed GRADE II INVASIVE DUCTAL CARCINOMA, DUCTAL CARCINOMA IN SITU of the RIGHT breast, 11:30 o'clock, 2 cm from nipple. This was found to be concordant by Dr. Peggye Fothergill. 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 This was found to be concordant by Dr. Peggye Fothergill. 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 This was found to be concordant by Dr. Peggye Fothergill. Pathology results were discussed with the patient by telephone. The patient reported doing well after the biopsies with tenderness at the sites. Post biopsy instructions and care were  reviewed and questions were answered. The patient was encouraged to call [facility] for any additional concerns. Pathology results were discussed with Dr. [name] on March 27, 2018. Dr. Burr Medico stated she would arrange a surgical referral for the patient at the appropriate time. The patient has an appointment to see Dr. Burr Medico on March 30, 2018. The patient underwent ultrasound-guided core needle biopsy of the liver on March 28, 2018 at [facility], and she is scheduled for ultrasound guided biopsy of 2 LEFT breast masses on March 29, 2018 at [facility]. Pathology results reported by Terie Purser, RN on 03/27/2018. Electronically Signed   By: Evangeline Dakin M.D.   On: 03/27/2018 15:06   Result Date: 03/27/2018 CLINICAL DATA:  62 year old who presented with a palpable lump in the OUTER RIGHT breast which was shown on diagnostic workup to represent an approximate 2.5 cm suspicious mass at the 9 o'clock position approximately 7 cm from the nipple. Diagnostic workup demonstrated a second highly suspicious 2.3 cm mass at the 11:30 o'clock position approximately 2 cm from the nipple associated with architectural distortion. A total of 5 abnormal lymph nodes were identified in the RIGHT axilla on the diagnostic ultrasound. Biopsy of the 2 breast masses and a RIGHT axillary node is performed. Of note, a recent CT of the abdomen and pelvis demonstrated innumerable liver metastases and a possible mass involving the rectum. EXAM: ULTRASOUND GUIDED RIGHT BREAST CORE NEEDLE BIOPSY x 2 ULTRASOUND GUIDED RIGHT AXILLARY NODE CORE NEEDLE BIOPSY COMPARISON:  Previous exam(s). FINDINGS: I met with the patient and we discussed the procedure of ultrasound-guided biopsy, including benefits and alternatives. We discussed the high likelihood of a successful procedure. We discussed the risks of the procedure, including infection, bleeding, tissue injury, clip migration, and  inadequate  sampling. Informed written consent was given. The usual time-out protocol was performed immediately prior to the procedure. Lesion quadrant (labeled # 1 for pathology, 11:30 o'clock position approximately 2 cm from the nipple): UPPER OUTER QUADRANT. Using sterile technique with chlorhexidine as skin antisepsis, 1% lidocaine and 1% lidocaine with epinephrine as local anesthetic, under direct ultrasound visualization, a 12 gauge Bard Marquee core needle device was used to initially perform biopsy of the suspicious mass in the UPPER OUTER QUADRANT at the 11:30 o'clock position approximately 2 cm from the nipple using a LATERAL approach. At the conclusion of the procedure a ribbon shaped tissue marker clip was deployed into the biopsy cavity. Lesion quadrant (labeled # 2 for pathology, 9 o'clock position approximately 7 cm from the nipple): Slight LOWER OUTER QUADRANT. Subsequently, using sterile technique with chlorhexidine as skin antisepsis, 1% lidocaine and 1% lidocaine with epinephrine as local anesthetic, under direct ultrasound visualization, a 12 gauge Bard Marquee core needle device was used to perform biopsy of the palpable mass in the OUTER RIGHT breast at the 9 o'clock position approximately 7 cm from the nipple using a LATERAL approach. At the conclusion of the procedure a coil shaped tissue marker clip was deployed into the biopsy cavity. Finally, using sterile technique with chlorhexidine as skin antisepsis, 1% lidocaine and 1% lidocaine with epinephrine as local anesthetic, under direct ultrasound visualization, a 14 gauge Bard Marquee core needle device was used to perform biopsy of 1 of the pathologic RIGHT axillary lymph nodes using a LATERAL approach. At the conclusion of the procedure a spiral shaped HydroMark tissue marker clip was deployed into the biopsy cavity. The RIGHT axilla biopsy samples are labeled # 3 for pathology. Follow-up two-view mammogram was performed and is dictated separately.  IMPRESSION: Ultrasound guided biopsy of 2 suspicious masses involving the RIGHT breast and a pathologic RIGHT axillary lymph node. No apparent complications. Electronically Signed: By: Evangeline Dakin M.D. On: 03/24/2018 16:31   Ir Imaging Guided Port Insertion  Result Date: 04/04/2018 CLINICAL DATA:  Metastatic breast cancer, access for chemotherapy EXAM: LEFT INTERNAL JUGULAR SINGLE LUMEN POWER PORT CATHETER INSERTION Date:  04/04/2018 04/04/2018 12:58 pm Radiologist:  Jerilynn Mages. Daryll Brod, MD Guidance:  Ultrasound and fluoroscopic MEDICATIONS: Ancef 2 g; The antibiotic was administered within an appropriate time interval prior to skin puncture. ANESTHESIA/SEDATION: Versed 2.0 mg IV; Fentanyl 100 mcg IV; Moderate Sedation Time:  30 minutes The patient was continuously monitored during the procedure by the interventional radiology nurse under my direct supervision. FLUOROSCOPY TIME:  One minutes, 18 seconds (8 mGy) COMPLICATIONS: None immediate. CONTRAST:  None. PROCEDURE: Informed consent was obtained from the patient following explanation of the procedure, risks, benefits and alternatives. The patient understands, agrees and consents for the procedure. All questions were addressed. A time out was performed. Maximal barrier sterile technique utilized including caps, mask, sterile gowns, sterile gloves, large sterile drape, hand hygiene, and 2% chlorhexidine scrub. Under sterile conditions and local anesthesia, left internal jugular micropuncture venous access was performed. Access was performed with ultrasound. Images were obtained for documentation of the patent left internal jugular vein. A guide wire was inserted followed by a transitional dilator. This allowed insertion of a guide wire and catheter into the IVC. Measurements were obtained from the SVC / RA junction back to the left IJ venotomy site. In the left infraclavicular chest, a subcutaneous pocket was created over the second anterior rib. This was done  under sterile conditions and local anesthesia. 1% lidocaine with epinephrine  was utilized for this. A 2.5 cm incision was made in the skin. Blunt dissection was performed to create a subcutaneous pocket over the right pectoralis major muscle. The pocket was flushed with saline vigorously. There was adequate hemostasis. The port catheter was assembled and checked for leakage. The port catheter was secured in the pocket with two retention sutures. The tubing was tunneled subcutaneously to the left venotomy site and inserted into the SVC/RA junction through a valved peel-away sheath. Position was confirmed with fluoroscopy. Images were obtained for documentation. The patient tolerated the procedure well. No immediate complications. Incisions were closed in a two layer fashion with 4 - 0 Vicryl suture. Dermabond was applied to the skin. The port catheter was accessed, blood was aspirated followed by saline and heparin flushes. Needle was removed. A dry sterile dressing was applied. IMPRESSION: Ultrasound and fluoroscopically guided left internal jugular single lumen power port catheter insertion. Tip in the SVC/RA junction. Catheter ready for use. Electronically Signed   By: Jerilynn Mages.  Shick M.D.   On: 04/04/2018 13:06

## 2018-04-21 ENCOUNTER — Inpatient Hospital Stay: Payer: 59

## 2018-04-21 ENCOUNTER — Inpatient Hospital Stay: Payer: 59 | Admitting: Nutrition

## 2018-04-21 ENCOUNTER — Other Ambulatory Visit: Payer: Self-pay

## 2018-04-21 VITALS — BP 117/81 | HR 80 | Temp 97.8°F | Resp 18 | Wt 139.5 lb

## 2018-04-21 DIAGNOSIS — Z170 Estrogen receptor positive status [ER+]: Secondary | ICD-10-CM | POA: Diagnosis not present

## 2018-04-21 DIAGNOSIS — D649 Anemia, unspecified: Secondary | ICD-10-CM | POA: Diagnosis not present

## 2018-04-21 DIAGNOSIS — C787 Secondary malignant neoplasm of liver and intrahepatic bile duct: Secondary | ICD-10-CM | POA: Diagnosis not present

## 2018-04-21 DIAGNOSIS — R300 Dysuria: Secondary | ICD-10-CM | POA: Diagnosis not present

## 2018-04-21 DIAGNOSIS — R740 Nonspecific elevation of levels of transaminase and lactic acid dehydrogenase [LDH]: Secondary | ICD-10-CM | POA: Diagnosis not present

## 2018-04-21 DIAGNOSIS — C50919 Malignant neoplasm of unspecified site of unspecified female breast: Secondary | ICD-10-CM

## 2018-04-21 DIAGNOSIS — Z5111 Encounter for antineoplastic chemotherapy: Secondary | ICD-10-CM | POA: Diagnosis not present

## 2018-04-21 DIAGNOSIS — C50411 Malignant neoplasm of upper-outer quadrant of right female breast: Secondary | ICD-10-CM | POA: Diagnosis not present

## 2018-04-21 DIAGNOSIS — Z95828 Presence of other vascular implants and grafts: Secondary | ICD-10-CM

## 2018-04-21 DIAGNOSIS — E876 Hypokalemia: Secondary | ICD-10-CM

## 2018-04-21 DIAGNOSIS — R197 Diarrhea, unspecified: Secondary | ICD-10-CM | POA: Diagnosis not present

## 2018-04-21 DIAGNOSIS — Z7189 Other specified counseling: Secondary | ICD-10-CM

## 2018-04-21 LAB — CMP (CANCER CENTER ONLY)
ALT: 93 U/L — ABNORMAL HIGH (ref 0–44)
AST: 203 U/L (ref 15–41)
Albumin: 3.4 g/dL — ABNORMAL LOW (ref 3.5–5.0)
Alkaline Phosphatase: 317 U/L — ABNORMAL HIGH (ref 38–126)
Anion gap: 10 (ref 5–15)
BUN: 27 mg/dL — ABNORMAL HIGH (ref 8–23)
CO2: 27 mmol/L (ref 22–32)
Calcium: 9 mg/dL (ref 8.9–10.3)
Chloride: 97 mmol/L — ABNORMAL LOW (ref 98–111)
Creatinine: 1.24 mg/dL — ABNORMAL HIGH (ref 0.44–1.00)
GFR, Est AFR Am: 53 mL/min — ABNORMAL LOW (ref >60)
GFR, Estimated: 46 mL/min — ABNORMAL LOW (ref >60)
Glucose, Bld: 103 mg/dL — ABNORMAL HIGH (ref 70–99)
Potassium: 3 mmol/L — CL (ref 3.5–5.1)
Sodium: 134 mmol/L — ABNORMAL LOW (ref 135–145)
Total Bilirubin: 0.7 mg/dL (ref 0.3–1.2)
Total Protein: 7 g/dL (ref 6.5–8.1)

## 2018-04-21 LAB — CBC WITH DIFFERENTIAL (CANCER CENTER ONLY)
Basophils Absolute: 0 10*3/uL (ref 0.0–0.1)
Basophils Relative: 1 %
Eosinophils Absolute: 0.1 10*3/uL (ref 0.0–0.5)
Eosinophils Relative: 2 %
HCT: 35.9 % (ref 34.8–46.6)
Hemoglobin: 11.7 g/dL (ref 11.6–15.9)
Lymphocytes Relative: 29 %
Lymphs Abs: 1.4 10*3/uL (ref 0.9–3.3)
MCH: 27.7 pg (ref 25.1–34.0)
MCHC: 32.6 g/dL (ref 31.5–36.0)
MCV: 84.8 fL (ref 79.5–101.0)
Monocytes Absolute: 0.3 10*3/uL (ref 0.1–0.9)
Monocytes Relative: 6 %
Neutro Abs: 3.1 10*3/uL (ref 1.5–6.5)
Neutrophils Relative %: 62 %
Platelet Count: 206 10*3/uL (ref 145–400)
RBC: 4.24 MIL/uL (ref 3.70–5.45)
RDW: 14.5 % (ref 11.2–14.5)
WBC Count: 5 10*3/uL (ref 3.9–10.3)

## 2018-04-21 MED ORDER — ONDANSETRON HCL 8 MG PO TABS
8.0000 mg | ORAL_TABLET | Freq: Once | ORAL | Status: AC
  Administered 2018-04-21: 8 mg via ORAL

## 2018-04-21 MED ORDER — PROCHLORPERAZINE MALEATE 10 MG PO TABS: 10.0000 mg | ORAL_TABLET | Freq: Once | ORAL | Status: DC

## 2018-04-21 MED ORDER — ONDANSETRON HCL 8 MG PO TABS
ORAL_TABLET | ORAL | Status: AC
  Filled 2018-04-21: qty 1

## 2018-04-21 MED ORDER — HEPARIN SOD (PORK) LOCK FLUSH 100 UNIT/ML IV SOLN
500.0000 [IU] | Freq: Once | INTRAVENOUS | Status: AC | PRN
  Administered 2018-04-21: 500 [IU]
  Filled 2018-04-21: qty 5

## 2018-04-21 MED ORDER — SODIUM CHLORIDE 0.9% FLUSH
10.0000 mL | INTRAVENOUS | Status: DC | PRN
  Administered 2018-04-21: 10 mL
  Filled 2018-04-21: qty 10

## 2018-04-21 MED ORDER — POTASSIUM CHLORIDE CRYS ER 20 MEQ PO TBCR: 20.0000 meq | EXTENDED_RELEASE_TABLET | Freq: Two times a day (BID) | ORAL | 1 refills | Status: DC

## 2018-04-21 MED ORDER — SODIUM CHLORIDE 0.9 % IV SOLN
Freq: Once | INTRAVENOUS | Status: AC
  Administered 2018-04-21: 11:00:00 via INTRAVENOUS
  Filled 2018-04-21: qty 250

## 2018-04-21 MED ORDER — PACLITAXEL PROTEIN-BOUND CHEMO INJECTION 100 MG
80.0000 mg/m2 | Freq: Once | INTRAVENOUS | Status: AC
  Administered 2018-04-21: 125 mg via INTRAVENOUS
  Filled 2018-04-21: qty 25

## 2018-04-21 MED ORDER — PROCHLORPERAZINE MALEATE 10 MG PO TABS
ORAL_TABLET | ORAL | Status: AC
  Filled 2018-04-21: qty 1

## 2018-04-21 NOTE — Progress Notes (Signed)
Per Dr. Burr Medico okay to treat with AST 203, Potassium is 3.0 will send in script for Potassium 20 meq take 2 tabs daily x 3 days, the one tablet to [facility] on [address].   Notified Product/process development scientist in Infusion.

## 2018-04-21 NOTE — Patient Instructions (Signed)
[facility] Discharge Instructions for Patients Receiving Chemotherapy  Today you received the following chemotherapy agents:  Abraxane.  To help prevent nausea and vomiting after your treatment, we encourage you to take your nausea medication as directed.   If you develop nausea and vomiting that is not controlled by your nausea medication, call the clinic.   BELOW ARE SYMPTOMS THAT SHOULD BE REPORTED IMMEDIATELY:  *FEVER GREATER THAN 100.5 F  *CHILLS WITH OR WITHOUT FEVER  NAUSEA AND VOMITING THAT IS NOT CONTROLLED WITH YOUR NAUSEA MEDICATION  *UNUSUAL SHORTNESS OF BREATH  *UNUSUAL BRUISING OR BLEEDING  TENDERNESS IN MOUTH AND THROAT WITH OR WITHOUT PRESENCE OF ULCERS  *URINARY PROBLEMS  *BOWEL PROBLEMS  UNUSUAL RASH Items with * indicate a potential emergency and should be followed up as soon as possible.  Feel free to call the clinic should you have any questions or concerns. The clinic phone number is [phone].  Please show the CHEMO ALERT CARD at check-in to the Emergency Department and triage nurse.   

## 2018-04-21 NOTE — Progress Notes (Signed)
Nutrition follow-up completed with patient receiving treatment for metastatic breast cancer. Weight decreased and documented as 134.1 pounds October 1 down from 143 pounds. Patient reports she continues to take nausea medicine for nausea and vomiting. When she has loose stools it is typically one episode and does not exceed 1 stool daily.  Nutrition diagnosis: Unintended weight loss continues.  Intervention: Patient educated to continue medications as needed to alleviate symptoms. Encourage patient to consume small amounts of bland foods frequently throughout the day. Encourage oral nutrition supplements as tolerated between meals. Questions were answered.  Teach back method used.  Monitoring, evaluation, goals: Patient will tolerate increased calories and protein to minimize further weight loss.  Next visit: To be scheduled as needed.  **Disclaimer: This note was dictated with voice recognition software. Similar sounding words can inadvertently be transcribed and this note may contain transcription errors which may not have been corrected upon publication of note.**

## 2018-04-25 ENCOUNTER — Other Ambulatory Visit: Payer: Self-pay

## 2018-04-25 ENCOUNTER — Observation Stay (HOSPITAL_COMMUNITY)
Admission: EM | Admit: 2018-04-25 | Discharge: 2018-04-25 | Disposition: A | Discharge status: Home / Self Care | Payer: 59 | Attending: Internal Medicine | Admitting: Internal Medicine

## 2018-04-25 ENCOUNTER — Encounter (HOSPITAL_COMMUNITY): Payer: Self-pay

## 2018-04-25 DIAGNOSIS — K921 Melena: Principal | ICD-10-CM

## 2018-04-25 DIAGNOSIS — C50919 Malignant neoplasm of unspecified site of unspecified female breast: Secondary | ICD-10-CM | POA: Diagnosis not present

## 2018-04-25 DIAGNOSIS — R112 Nausea with vomiting, unspecified: Secondary | ICD-10-CM | POA: Diagnosis present

## 2018-04-25 DIAGNOSIS — C787 Secondary malignant neoplasm of liver and intrahepatic bile duct: Secondary | ICD-10-CM | POA: Diagnosis not present

## 2018-04-25 DIAGNOSIS — Z79899 Other long term (current) drug therapy: Secondary | ICD-10-CM | POA: Diagnosis not present

## 2018-04-25 DIAGNOSIS — K219 Gastro-esophageal reflux disease without esophagitis: Secondary | ICD-10-CM | POA: Insufficient documentation

## 2018-04-25 DIAGNOSIS — R1011 Right upper quadrant pain: Secondary | ICD-10-CM | POA: Diagnosis not present

## 2018-04-25 DIAGNOSIS — K922 Gastrointestinal hemorrhage, unspecified: Secondary | ICD-10-CM | POA: Diagnosis not present

## 2018-04-25 DIAGNOSIS — I10 Essential (primary) hypertension: Secondary | ICD-10-CM | POA: Insufficient documentation

## 2018-04-25 DIAGNOSIS — R1013 Epigastric pain: Secondary | ICD-10-CM | POA: Diagnosis not present

## 2018-04-25 HISTORY — DX: Malignant (primary) neoplasm, unspecified: C80.1

## 2018-04-25 LAB — TYPE AND SCREEN
ABO/RH(D): O POS
Antibody Screen: NEGATIVE

## 2018-04-25 LAB — COMPREHENSIVE METABOLIC PANEL
ALT: 107 U/L — ABNORMAL HIGH (ref 0–44)
AST: 166 U/L — ABNORMAL HIGH (ref 15–41)
Albumin: 3.9 g/dL (ref 3.5–5.0)
Alkaline Phosphatase: 248 U/L — ABNORMAL HIGH (ref 38–126)
Anion gap: 13 (ref 5–15)
BUN: 16 mg/dL (ref 8–23)
CO2: 23 mmol/L (ref 22–32)
Calcium: 9.7 mg/dL (ref 8.9–10.3)
Chloride: 99 mmol/L (ref 98–111)
Creatinine, Ser: 1.04 mg/dL — ABNORMAL HIGH (ref 0.44–1.00)
GFR calc Af Amer: >60 mL/min (ref >60)
GFR calc non Af Amer: 57 mL/min — ABNORMAL LOW (ref >60)
Glucose, Bld: 92 mg/dL (ref 70–99)
Potassium: 3.8 mmol/L (ref 3.5–5.1)
Sodium: 135 mmol/L (ref 135–145)
Total Bilirubin: 1.2 mg/dL (ref 0.3–1.2)
Total Protein: 7.4 g/dL (ref 6.5–8.1)

## 2018-04-25 LAB — CBC
HCT: 34.5 % — ABNORMAL LOW (ref 36.0–46.0)
Hemoglobin: 11.4 g/dL — ABNORMAL LOW (ref 12.0–15.0)
MCH: 28.1 pg (ref 26.0–34.0)
MCHC: 33 g/dL (ref 30.0–36.0)
MCV: 85 fL (ref 80.0–100.0)
Platelets: 268 10*3/uL (ref 150–400)
RBC: 4.06 MIL/uL (ref 3.87–5.11)
RDW: 14.4 % (ref 11.5–15.5)
WBC: 3.2 10*3/uL — ABNORMAL LOW (ref 4.0–10.5)
nRBC: 0 % (ref 0.0–0.2)

## 2018-04-25 LAB — ABO/RH: ABO/RH(D): O POS

## 2018-04-25 LAB — POC OCCULT BLOOD, ED: Fecal Occult Bld: NEGATIVE

## 2018-04-25 MED ORDER — HEPARIN SOD (PORK) LOCK FLUSH 100 UNIT/ML IV SOLN
500.0000 [IU] | Freq: Once | INTRAVENOUS | Status: AC
  Administered 2018-04-25: 500 [IU]
  Filled 2018-04-25: qty 5

## 2018-04-25 MED ORDER — LIDOCAINE-PRILOCAINE 2.5-2.5 % EX CREA
TOPICAL_CREAM | Freq: Once | CUTANEOUS | Status: AC
  Administered 2018-04-25: 20:00:00 via TOPICAL
  Filled 2018-04-25: qty 5

## 2018-04-25 NOTE — ED Triage Notes (Signed)
Patient c/o having a large amount of black tarry stool today. Patient also c/o mid abdominal cramping. Patient currently receiving chemo. Last chemo treatment was 4 days ago.

## 2018-04-25 NOTE — ED Provider Notes (Signed)
[facility] DEPT Provider Note   CSN: [id number] Arrival date & time: 04/25/18  1746     History   Chief Complaint Chief Complaint  Patient presents with  . chemo patient  . GI Bleeding    HPI Teresa Hays is a 62 y.o. female with a history of metastatic breast cancer currently on abraxane presented with a one-time episode of large dark tarry stool and abdominal cramps.  She says that she has ongoing loose stools secondary to chemotherapy however yesterday at around 4 PM she large melanic stool with associated epigastric/right upper quadrant abdominal cramps which typically worsened with p.o. intake and bowel movement.  She also complains of nausea, nonbilious nonbloody emesis, headache, dizziness, lightheadedness and weakness.  She denies fevers, chills, hematochezia or bright red blood per rectum   Past Medical History:  Diagnosis Date  . Cancer ([place])   . GERD (gastroesophageal reflux disease)   . Hypertension     Patient Active Problem List   Diagnosis Date Noted  . Port-A-Cath in place 04/21/2018  . Metastatic breast cancer ([place]) 03/30/2018  . Goals of care, counseling/discussion 03/30/2018  . Liver masses 03/16/2018    Past Surgical History:  Procedure Laterality Date  . COLONOSCOPY    . ESOPHAGOGASTRODUODENOSCOPY ENDOSCOPY    . IR IMAGING GUIDED PORT INSERTION  04/04/2018     OB History   None      Home Medications    Prior to Admission medications   Medication Sig Start Date End Date Taking? Authorizing Provider  amLODipine (NORVASC) 5 MG tablet Take 5 mg by mouth daily.    Yes [provider]  atenolol (TENORMIN) 50 MG tablet Take 50 mg by mouth every evening.    Yes [provider]  dronabinol (MARINOL) 2.5 MG capsule Take 1 capsule twice daily before meals as needed Patient taking differently: Take 2.5 mg by mouth 2 (two) times daily before lunch and supper. Take 1 capsule twice daily before  meals as needed appetite 04/14/18  Yes Alla Feeling, NP  hydrochlorothiazide (HYDRODIURIL) 25 MG tablet Take 25 mg by mouth daily.   Yes [provider]  ondansetron (ZOFRAN) 8 MG tablet Take 1 tablet (8 mg total) by mouth 2 (two) times daily as needed (Nausea or vomiting). 03/30/18  Yes [name], MD  potassium chloride SA (K-DUR,KLOR-CON) 20 MEQ tablet Take 1 tablet (20 mEq total) by mouth 2 (two) times daily. Take 1 tablet by mouth 2 times daily for 3 days, then one tablet daily. 04/21/18  Yes [name], MD  traMADol (ULTRAM) 50 MG tablet Take 1 tablet (50 mg total) by mouth every 6 (six) hours as needed. Patient taking differently: Take 50 mg by mouth every 6 (six) hours as needed for moderate pain or severe pain.  03/17/18  Yes [name], MD  lidocaine-prilocaine (EMLA) cream Apply to affected area once 03/31/18   [name], MD  LORazepam (ATIVAN) 0.5 MG tablet Take 1 tablet (0.5 mg total) by mouth at bedtime as needed for anxiety. 04/14/18   Alla Feeling, NP  mirtazapine (REMERON) 7.5 MG tablet Take 1 tablet (7.5 mg total) by mouth at bedtime. Patient not taking: Reported on 04/25/2018 03/17/18   [name], MD  ondansetron (ZOFRAN) 8 MG tablet Take 1 tablet (8 mg total) by mouth every 8 (eight) hours as needed for nausea or vomiting. Patient not taking: Reported on 04/14/2018 03/16/18   [name], MD  prochlorperazine (COMPAZINE) 10 MG tablet  Take 1 tablet (10 mg total) by mouth every 6 (six) hours as needed (Nausea or vomiting). Patient not taking: Reported on 04/25/2018 03/31/18   [name], MD  ranitidine (ZANTAC) 150 MG tablet Take 150 mg by mouth 2 (two) times daily.     [provider]    Family History Family History  Problem Relation Age of Onset  . Diabetes Mother   . Cancer Sister 87       breast cancer  . Rheum arthritis Sister     Social History Social History   Tobacco Use  . Smoking status: Never Smoker  . Smokeless tobacco: Never Used  Substance Use  Topics  . Alcohol use: No  . Drug use: Never     Allergies   Patient has no known allergies.   Review of Systems Review of Systems  Constitutional: Positive for appetite change.  HENT: Negative.   Eyes: Negative.   Respiratory: Negative.   Cardiovascular: Negative.   Gastrointestinal: Positive for abdominal pain and vomiting (nonbilious, nonbloody). Blood in stool: melena. Diarrhea: loose stools.  Musculoskeletal: Negative.   Skin: Negative.   Neurological: Positive for dizziness, weakness, light-headedness and headaches.  Psychiatric/Behavioral: Negative.      Physical Exam Updated Vital Signs BP (!) 139/91   Pulse 66   Temp 97.9 F (36.6 C) (Oral)   Resp 15   Ht 5\' 4"  (1.626 m)   Wt 60.8 kg   SpO2 99%   BMI 23.00 kg/m   Physical Exam  Constitutional: She appears well-developed and well-nourished.  HENT:  Head: Normocephalic and atraumatic.  Neck: Normal range of motion. Neck supple.  Cardiovascular: Normal rate and regular rhythm.  Pulmonary/Chest: Effort normal and breath sounds normal.  Abdominal: Soft. Bowel sounds are normal. She exhibits no distension and no mass. There is tenderness (epigastrium, RUQ). There is no rebound and no guarding.  Neurological: She is alert.  Skin: Skin is warm.  Psychiatric: She has a normal mood and affect. Her behavior is normal. Judgment and thought content normal.     ED Treatments / Results  Labs (all labs ordered are listed, but only abnormal results are displayed) Labs Reviewed  COMPREHENSIVE METABOLIC PANEL - Abnormal; Notable for the following components:      Result Value   Creatinine, Ser 1.04 (*)    AST 166 (*)    ALT 107 (*)    Alkaline Phosphatase 248 (*)    GFR calc non Af Amer 57 (*)    All other components within normal limits  CBC - Abnormal; Notable for the following components:   WBC 3.2 (*)    Hemoglobin 11.4 (*)    HCT 34.5 (*)    All other components within normal limits  POC OCCULT BLOOD,  ED  TYPE AND SCREEN  ABO/RH    EKG None  Radiology No results found.  Procedures Procedures (including critical care time)  Medications Ordered in ED Medications  lidocaine-prilocaine (EMLA) cream ( Topical Given 04/25/18 1943)     Initial Impression / Assessment and Plan / ED Course  I have reviewed the triage vital signs and the nursing notes.  Pertinent labs & imaging results that were available during my care of the patient were reviewed by me and considered in my medical decision making (see chart for details).   62 year old woman with metastatic breast cancer and hypertension presenting with melenic stools with pain in the epigastrium and right upper quadrant.  Also with complaints of headaches, dizziness, lightheadedness  and weakness.  She remains hemodynamically stable and vital signs negative for tachycardia or hypotension.   CBC shows with stable hemoglobin, CMP elevated LFTs due to liver metastasis, point-of-care occult blood was negative.   Patient will be admitted to the Hospitalist service for further work up of upper GI Bleed     Final Clinical Impressions(s) / ED Diagnoses   Final diagnoses:  Melena  Upper GI bleed    ED Discharge Orders    None       Jean Rosenthal, MD 04/25/18 2228    Lacretia Leigh, MD 04/26/18 1342

## 2018-04-25 NOTE — ED Provider Notes (Signed)
I saw and evaluated the patient, reviewed the resident's note and I agree with the findings and plan.  EKG: None 62 year old female with history of breast cancer who presents with large black stool today with some associated abdominal cramping.  Guaiac negative on Hemoccult exam.  Suspect upper GI and labs are pending and will likely admit to hospitalist service with consult GI   Lacretia Leigh, MD 04/25/18 2005

## 2018-04-25 NOTE — ED Notes (Addendum)
Pt requesting EMLA cream to be on for 20-30 minutes before accessing her port for blood

## 2018-04-25 NOTE — Discharge Instructions (Addendum)
Ms. Teresa Hays,   It was a pleasure taking care of you here at the Ed. The blood work showed that your hemoglobin was stable and also the fecal test did not show that you had blood in your GI tract.   I will like for you too follow up with your oncologist.   Return back to the ED if you experience worsening symptoms such as consistent black stools, nausea, vomiting, headache, chest pain or trouble breathing.   ~Take Care  Dr. Eileen Stanford

## 2018-04-25 NOTE — Consult Note (Addendum)
Medical Consultation   Teresa Hays  GYJ:[id number]  DOB: 1955/11/25  DOA: 04/25/2018  PCP: Levin Bacon, NP  Outpatient Specialists: Dr. [name], Oncology   Requesting physician: Dr. Zenia Resides  Reason for consultation: Possible GI bleed   History of Present Illness: Teresa Hays is an 62 y.o. 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  In the past, she was told to present to the ED if she had black stool.  She is otherwise at her baseline. The patient denies prior episodes of black stool, endorses mild abdominal discomfort that is not increased from her baseline, endorses nausea with occasional nonbloody, nonbilious emesis that is also consistent with her baseline as she is on active chemotherapy.  She endorses poor appetite. Denies fever, chills.  Denies lightheadedness or difficulty with ambulation.  A fecal occult obtained in the ED was negative.  Hemoglobin stable from prior at 11.4.  Renal function and LFTs are mildly improved since the last time they were checked on Friday, 10/4.  She has an upcoming appointment with her oncologist on Friday, 10/11. Patient would prefer to go home rather than stay overnight for a observation admission.  Review of Systems:  Review of Systems  Constitutional: Positive for malaise/fatigue and weight loss. Negative for chills and fever.  HENT: Negative for congestion and sore throat.   Respiratory: Negative for cough and shortness of breath.   Cardiovascular: Negative for chest pain and palpitations.  Gastrointestinal: Positive for nausea and vomiting. Negative for abdominal pain and diarrhea.  Genitourinary: Negative for dysuria, hematuria and urgency.  Skin: Negative for itching and rash.  Neurological: Negative  for dizziness, weakness and headaches.  Endo/Heme/Allergies: Does not bruise/bleed easily.   As per HPI otherwise 10 point review of systems negative.   Past Medical History: Past Medical History:  Diagnosis Date  . Cancer ([place])   . GERD (gastroesophageal reflux disease)   . Hypertension     Past Surgical History: Past Surgical History:  Procedure Laterality Date  . COLONOSCOPY    . ESOPHAGOGASTRODUODENOSCOPY ENDOSCOPY    . IR IMAGING GUIDED PORT INSERTION  04/04/2018     Allergies:  No Known Allergies   Social History:  reports that she has never smoked. She has never used smokeless tobacco. She reports that she does not drink alcohol or use drugs.   Family History: Family History  Problem Relation Age of Onset  . Diabetes Mother   . Cancer Sister 35       breast cancer  . Rheum arthritis Sister      Physical Exam: Vitals:   04/25/18 2130 04/25/18 2200 04/25/18 2230 04/25/18 2300  BP: (!) 141/98 (!) 139/91 (!) 141/96 (!) 146/94  Pulse: 69 66 66 70  Resp: _0 Temp:      TempSrc:      SpO2: 98% 99% 100% 100%  Weight:      Height:        Constitutional: Alert and awake, oriented x3, not in any acute distress. Eyes: PERLA, EOMI, irises appear normal, anicteric sclera,  ENMT: external ears and nose appear normal, normal hearing            Lips appears normal, oropharynx mucosa, tongue, posterior pharynx  appear normal  Neck: neck appears normal, no masses, normal ROM CVS: S1-S2 clear, no murmur rubs or gallops, no LE edema, normal pedal pulses  Respiratory:  clear to auscultation bilaterally, no wheezing, rales or rhonchi. Respiratory effort normal. No accessory muscle use.  Abdomen: soft, nontender, nondistended, normal bowel sounds, no hernias  Musculoskeletal: : no cyanosis, clubbing or edema noted bilaterally Neuro: Cranial nerves II-XII intact, strength, sensation, reflexes Psych: judgement and insight appear normal, stable mood and affect,  mental status Skin: no rashes or lesions or ulcers, no induration or nodules   Data reviewed:  I have personally reviewed following labs and imaging studies Labs:  CBC: Recent Labs  Lab 04/21/18 0858 04/25/18 2030  WBC 5.0 3.2*  NEUTROABS 3.1  --   HGB 11.7 11.4*  HCT 35.9 34.5*  MCV 84.8 85.0  PLT 206 967    Basic Metabolic Panel: Recent Labs  Lab 04/21/18 0858 04/25/18 2030  NA 134* 135  K 3.0* 3.8  CL 97* 99  CO2 27 23  GLUCOSE 103* 92  BUN 27* 16  CREATININE 1.24* 1.04*  CALCIUM 9.0 9.7   GFR Estimated Creatinine Clearance: 49.1 mL/min (A) (by C-G formula based on SCr of 1.04 mg/dL (H)). Liver Function Tests: Recent Labs  Lab 04/21/18 0858 04/25/18 2030  AST 203* 166*  ALT 93* 107*  ALKPHOS 317* 248*  BILITOT 0.7 1.2  PROT 7.0 7.4  ALBUMIN 3.4* 3.9   No results for input(s): LIPASE, AMYLASE in the last 168 hours. No results for input(s): AMMONIA in the last 168 hours. Coagulation profile No results for input(s): INR, PROTIME in the last 168 hours.  Cardiac Enzymes: No results for input(s): CKTOTAL, CKMB, CKMBINDEX, TROPONINI in the last 168 hours. BNP: Invalid input(s): POCBNP CBG: No results for input(s): GLUCAP in the last 168 hours. D-Dimer No results for input(s): DDIMER in the last 72 hours. Hgb A1c No results for input(s): HGBA1C in the last 72 hours. Lipid Profile No results for input(s): CHOL, HDL, LDLCALC, TRIG, CHOLHDL, LDLDIRECT in the last 72 hours. Thyroid function studies No results for input(s): TSH, T4TOTAL, T3FREE, THYROIDAB in the last 72 hours.  Invalid input(s): FREET3 Anemia work up No results for input(s): VITAMINB12, FOLATE, FERRITIN, TIBC, IRON, RETICCTPCT in the last 72 hours. Urinalysis No results found for: COLORURINE, APPEARANCEUR, LABSPEC, [place], GLUCOSEU, HGBUR, BILIRUBINUR, KETONESUR, PROTEINUR, UROBILINOGEN, NITRITE, [place]   Microbiology No results found for this or any previous visit (from the past  240 hour(s)).    Radiological Exams on Admission: No results found.  Impression/Recommendations Active Problems:   Nausea with vomiting  Teresa Hays is a 62 y.o. female with breast cancer metastatic to the liver on active chemotherapy who presents to the ED with an episode of dark stool. Fecal occult is negative in the ED. Hemoglobin is stable from most recent testing. Only new med is potassium supplementation. Although a negative FOBT does not exclude a slow GI bleed, it is unlikely that this patient has an acute upper GI bleed given her stable hemoglobin, negative FOBT and lack of risk factors. Brisk GI bleed is essentially excluded. She does not present with signs or symptoms consistent with esophagitis, gastritis or PUD. It would be quite reasonable to discharge this patient from the ED with close follow up with repeat CBC and instructions to continue her home H2 blocker.  - Reasonable to discharge patient from ED with Oncology follow up on Friday, 10/11 - Repeat CBC at next appointment - Continue  home H2 blocker, avoid NSAIDs - Strict return precautions if continued black stool or new symptoms develop reviewed with patient  Thank you for this consultation.  Our [facility] hospitalist team will sign off at this time.  Time Spent: 55 minutes  Bennie Pierini M.D. [facility] 04/25/2018, 11:20 PM

## 2018-04-27 ENCOUNTER — Telehealth: Payer: Self-pay | Admitting: *Deleted

## 2018-04-27 ENCOUNTER — Telehealth: Payer: Self-pay

## 2018-04-27 ENCOUNTER — Other Ambulatory Visit: Payer: Self-pay

## 2018-04-27 DIAGNOSIS — C50919 Malignant neoplasm of unspecified site of unspecified female breast: Secondary | ICD-10-CM

## 2018-04-27 NOTE — Telephone Encounter (Signed)
Spoke with patient she had gone to the ED on 10/8 for black tarry stool, now has diarrhea, has not tried Imodium, instructed on use, offered her to come in for IVF today, she states she would like to try the Imodium first.  Told her to call me back if Imodium does not work.  She is drinking fluids and Ensure.    Has appointment tomorrow for treatment.

## 2018-04-27 NOTE — Telephone Encounter (Signed)
"  Teresa Hays [phone]).   I keep having diarrhea.  Every thing comes straight through me.  This has been happening for a while.   Diarrhea once this morning and three times yesterday. Solid foods feel like they get stuck; not eating a lot.  Drinking regular or Ensure Plus three times a day.   No fever or pain except the normal, continuous, dull ache in my stomach.   I have Imodium but have never used it.  Should I use Imodium?        Zofran yesterday morning; vomited last night.  I cannot take the Compazine; any medicine that ends with 'ine' makes me jittery."

## 2018-04-27 NOTE — Telephone Encounter (Signed)
Agree. Avoid NSAIDs, aspirin. She will see Dr. Burr Medico tomorrow.  Thanks, Regan Rakers

## 2018-04-28 ENCOUNTER — Inpatient Hospital Stay (HOSPITAL_BASED_OUTPATIENT_CLINIC_OR_DEPARTMENT_OTHER): Payer: 59 | Admitting: Hematology

## 2018-04-28 ENCOUNTER — Encounter: Payer: Self-pay | Admitting: Hematology

## 2018-04-28 ENCOUNTER — Inpatient Hospital Stay: Payer: 59

## 2018-04-28 ENCOUNTER — Ambulatory Visit: Payer: 59

## 2018-04-28 VITALS — BP 135/82 | HR 68 | Temp 97.9°F | Resp 18 | Ht 64.0 in | Wt 134.1 lb

## 2018-04-28 DIAGNOSIS — E876 Hypokalemia: Secondary | ICD-10-CM | POA: Diagnosis not present

## 2018-04-28 DIAGNOSIS — C50919 Malignant neoplasm of unspecified site of unspecified female breast: Secondary | ICD-10-CM

## 2018-04-28 DIAGNOSIS — K5900 Constipation, unspecified: Secondary | ICD-10-CM

## 2018-04-28 DIAGNOSIS — Z23 Encounter for immunization: Secondary | ICD-10-CM

## 2018-04-28 DIAGNOSIS — R197 Diarrhea, unspecified: Secondary | ICD-10-CM

## 2018-04-28 DIAGNOSIS — Z95828 Presence of other vascular implants and grafts: Secondary | ICD-10-CM

## 2018-04-28 DIAGNOSIS — Z803 Family history of malignant neoplasm of breast: Secondary | ICD-10-CM

## 2018-04-28 DIAGNOSIS — K219 Gastro-esophageal reflux disease without esophagitis: Secondary | ICD-10-CM | POA: Diagnosis not present

## 2018-04-28 DIAGNOSIS — C787 Secondary malignant neoplasm of liver and intrahepatic bile duct: Secondary | ICD-10-CM

## 2018-04-28 DIAGNOSIS — I10 Essential (primary) hypertension: Secondary | ICD-10-CM

## 2018-04-28 DIAGNOSIS — R630 Anorexia: Secondary | ICD-10-CM

## 2018-04-28 DIAGNOSIS — Z170 Estrogen receptor positive status [ER+]: Secondary | ICD-10-CM

## 2018-04-28 DIAGNOSIS — M545 Low back pain: Secondary | ICD-10-CM

## 2018-04-28 DIAGNOSIS — C50411 Malignant neoplasm of upper-outer quadrant of right female breast: Secondary | ICD-10-CM

## 2018-04-28 DIAGNOSIS — Z79899 Other long term (current) drug therapy: Secondary | ICD-10-CM

## 2018-04-28 DIAGNOSIS — R740 Nonspecific elevation of levels of transaminase and lactic acid dehydrogenase [LDH]: Secondary | ICD-10-CM | POA: Diagnosis not present

## 2018-04-28 DIAGNOSIS — E860 Dehydration: Secondary | ICD-10-CM

## 2018-04-28 DIAGNOSIS — D649 Anemia, unspecified: Secondary | ICD-10-CM | POA: Diagnosis not present

## 2018-04-28 DIAGNOSIS — R112 Nausea with vomiting, unspecified: Secondary | ICD-10-CM

## 2018-04-28 DIAGNOSIS — Z5111 Encounter for antineoplastic chemotherapy: Secondary | ICD-10-CM | POA: Diagnosis not present

## 2018-04-28 DIAGNOSIS — Z7189 Other specified counseling: Secondary | ICD-10-CM

## 2018-04-28 DIAGNOSIS — R300 Dysuria: Secondary | ICD-10-CM | POA: Diagnosis not present

## 2018-04-28 LAB — CBC WITH DIFFERENTIAL (CANCER CENTER ONLY)
Abs Immature Granulocytes: 0.01 10*3/uL (ref 0.00–0.07)
Basophils Absolute: 0.1 10*3/uL (ref 0.0–0.1)
Basophils Relative: 2 %
Eosinophils Absolute: 0 10*3/uL (ref 0.0–0.5)
Eosinophils Relative: 1 %
HCT: 32.4 % — ABNORMAL LOW (ref 36.0–46.0)
Hemoglobin: 10.9 g/dL — ABNORMAL LOW (ref 12.0–15.0)
Immature Granulocytes: 0 %
Lymphocytes Relative: 57 %
Lymphs Abs: 1.8 10*3/uL (ref 0.7–4.0)
MCH: 28 pg (ref 26.0–34.0)
MCHC: 33.6 g/dL (ref 30.0–36.0)
MCV: 83.3 fL (ref 80.0–100.0)
Monocytes Absolute: 0.2 10*3/uL (ref 0.1–1.0)
Monocytes Relative: 5 %
Neutro Abs: 1.1 10*3/uL — ABNORMAL LOW (ref 1.7–7.7)
Neutrophils Relative %: 35 %
Platelet Count: 317 10*3/uL (ref 150–400)
RBC: 3.89 MIL/uL (ref 3.87–5.11)
RDW: 14.2 % (ref 11.5–15.5)
WBC Count: 3.2 10*3/uL — ABNORMAL LOW (ref 4.0–10.5)
nRBC: 0 % (ref 0.0–0.2)

## 2018-04-28 LAB — CMP (CANCER CENTER ONLY)
ALT: 118 U/L — ABNORMAL HIGH (ref 0–44)
AST: 127 U/L — ABNORMAL HIGH (ref 15–41)
Albumin: 3.9 g/dL (ref 3.5–5.0)
Alkaline Phosphatase: 236 U/L — ABNORMAL HIGH (ref 38–126)
Anion gap: 12 (ref 5–15)
BUN: 15 mg/dL (ref 8–23)
CO2: 24 mmol/L (ref 22–32)
Calcium: 9.6 mg/dL (ref 8.9–10.3)
Chloride: 96 mmol/L — ABNORMAL LOW (ref 98–111)
Creatinine: 1.4 mg/dL — ABNORMAL HIGH (ref 0.44–1.00)
GFR, Est AFR Am: 46 mL/min — ABNORMAL LOW (ref >60)
GFR, Estimated: 40 mL/min — ABNORMAL LOW (ref >60)
Glucose, Bld: 100 mg/dL — ABNORMAL HIGH (ref 70–99)
Potassium: 3.4 mmol/L — ABNORMAL LOW (ref 3.5–5.1)
Sodium: 132 mmol/L — ABNORMAL LOW (ref 135–145)
Total Bilirubin: 0.9 mg/dL (ref 0.3–1.2)
Total Protein: 7.3 g/dL (ref 6.5–8.1)

## 2018-04-28 LAB — TYPE AND SCREEN
ABO/RH(D): O POS
Antibody Screen: NEGATIVE

## 2018-04-28 LAB — ABO/RH: ABO/RH(D): O POS

## 2018-04-28 MED ORDER — DIPHENHYDRAMINE HCL 50 MG/ML IJ SOLN
50.0000 mg | Freq: Once | INTRAMUSCULAR | Status: AC
  Administered 2018-04-28: 50 mg via INTRAVENOUS

## 2018-04-28 MED ORDER — OMEPRAZOLE 20 MG PO CPDR: 20.0000 mg | DELAYED_RELEASE_CAPSULE | Freq: Every day | ORAL | 1 refills | Status: DC

## 2018-04-28 MED ORDER — SODIUM CHLORIDE 0.9 % IV SOLN
420.0000 mg | Freq: Once | INTRAVENOUS | Status: AC
  Administered 2018-04-28: 420 mg via INTRAVENOUS
  Filled 2018-04-28: qty 14

## 2018-04-28 MED ORDER — PROCHLORPERAZINE MALEATE 10 MG PO TABS
10.0000 mg | ORAL_TABLET | Freq: Once | ORAL | Status: AC
  Administered 2018-04-28: 10 mg via ORAL

## 2018-04-28 MED ORDER — ACETAMINOPHEN 325 MG PO TABS
ORAL_TABLET | ORAL | Status: AC
  Filled 2018-04-28: qty 2

## 2018-04-28 MED ORDER — INFLUENZA VAC SPLIT QUAD 0.5 ML IM SUSY
PREFILLED_SYRINGE | INTRAMUSCULAR | Status: AC
  Filled 2018-04-28: qty 0.5

## 2018-04-28 MED ORDER — SODIUM CHLORIDE 0.9% FLUSH
10.0000 mL | INTRAVENOUS | Status: DC | PRN
  Administered 2018-04-28: 10 mL
  Filled 2018-04-28: qty 10

## 2018-04-28 MED ORDER — PACLITAXEL PROTEIN-BOUND CHEMO INJECTION 100 MG
80.0000 mg/m2 | Freq: Once | INTRAVENOUS | Status: AC
  Administered 2018-04-28: 125 mg via INTRAVENOUS
  Filled 2018-04-28: qty 25

## 2018-04-28 MED ORDER — SODIUM CHLORIDE 0.9 % IV SOLN
Freq: Once | INTRAVENOUS | Status: AC
  Administered 2018-04-28: 11:00:00 via INTRAVENOUS
  Filled 2018-04-28: qty 250

## 2018-04-28 MED ORDER — ACETAMINOPHEN 325 MG PO TABS
650.0000 mg | ORAL_TABLET | Freq: Once | ORAL | Status: AC
  Administered 2018-04-28: 650 mg via ORAL

## 2018-04-28 MED ORDER — PROCHLORPERAZINE MALEATE 10 MG PO TABS
ORAL_TABLET | ORAL | Status: AC
  Filled 2018-04-28: qty 1

## 2018-04-28 MED ORDER — TRASTUZUMAB CHEMO 150 MG IV SOLR
6.0000 mg/kg | Freq: Once | INTRAVENOUS | Status: AC
  Administered 2018-04-28: 357 mg via INTRAVENOUS
  Filled 2018-04-28: qty 17

## 2018-04-28 MED ORDER — SODIUM CHLORIDE 0.9 % IV SOLN
INTRAVENOUS | Status: DC
  Administered 2018-04-28: 11:00:00 via INTRAVENOUS
  Filled 2018-04-28 (×2): qty 250

## 2018-04-28 MED ORDER — INFLUENZA VAC SPLIT QUAD 0.5 ML IM SUSY
0.5000 mL | PREFILLED_SYRINGE | Freq: Once | INTRAMUSCULAR | Status: AC
  Administered 2018-04-28: 0.5 mL via INTRAMUSCULAR

## 2018-04-28 MED ORDER — ONDANSETRON HCL 8 MG PO TABS: 8.0000 mg | ORAL_TABLET | Freq: Once | ORAL | Status: DC

## 2018-04-28 MED ORDER — DIPHENHYDRAMINE HCL 50 MG/ML IJ SOLN
INTRAMUSCULAR | Status: AC
  Filled 2018-04-28: qty 1

## 2018-04-28 MED ORDER — HEPARIN SOD (PORK) LOCK FLUSH 100 UNIT/ML IV SOLN
500.0000 [IU] | Freq: Once | INTRAVENOUS | Status: AC | PRN
  Administered 2018-04-28: 500 [IU]
  Filled 2018-04-28: qty 5

## 2018-04-28 NOTE — Patient Instructions (Signed)
[facility] Discharge Instructions for Patients Receiving Chemotherapy  Today you received the following chemotherapy agents: Herceptin, Perjeta, and Abraxane  To help prevent nausea and vomiting after your treatment, we encourage you to take your nausea medication as directed   If you develop nausea and vomiting that is not controlled by your nausea medication, call the clinic.   BELOW ARE SYMPTOMS THAT SHOULD BE REPORTED IMMEDIATELY:  *FEVER GREATER THAN 100.5 F  *CHILLS WITH OR WITHOUT FEVER  NAUSEA AND VOMITING THAT IS NOT CONTROLLED WITH YOUR NAUSEA MEDICATION  *UNUSUAL SHORTNESS OF BREATH  *UNUSUAL BRUISING OR BLEEDING  TENDERNESS IN MOUTH AND THROAT WITH OR WITHOUT PRESENCE OF ULCERS  *URINARY PROBLEMS  *BOWEL PROBLEMS  UNUSUAL RASH Items with * indicate a potential emergency and should be followed up as soon as possible.  Feel free to call the clinic should you have any questions or concerns. The clinic phone number is [phone].  Please show the [place] at check-in to the Emergency Department and triage nurse.

## 2018-04-28 NOTE — Progress Notes (Signed)
[facility]  [phone] Fax:([phone]) [phone]  Clinic Follow-up Note   Patient Care Team: Levin Bacon, NP as PCP - General (Family Medicine) [name], MD as Consulting Physician (Gastroenterology) [name], MD as Consulting Physician (Hematology) 04/28/2018   CHIEF COMPLAINTS:  F/u metastatic breast cancer  Oncology History   Cancer Staging Metastatic breast cancer [facility]) Staging form: Breast, AJCC 8th Edition - Clinical stage from 03/24/2018: Stage IV (cT2, cN1, pM1, G3, ER+, PR+, HER2+) - Signed by [name], MD on 03/30/2018       Metastatic breast cancer ([place])   01/30/2018 Procedure    Colonoscopy showed small polyp in the sigmoid colon, removed, the exam of colon including the terminal ileum was otherwise negative.    01/30/2018 Procedure    EGD by Dr. Collene Mares showed small hiatal hernia, a 8 mm polypoid lesion in the cardia, biopsied.    03/09/2018 Imaging    03/09/2018 US Abdomen IMPRESSION: 1. Mass lesions throughout the liver, consistent with metastatic disease. Liver as a somewhat nodular contour suggesting underlying hepatic cirrhosis. Inhomogeneous echotexture to the liver.  2. Cholelithiasis with mild gallbladder wall thickening. A degree of cholecystitis cannot be excluded by ultrasound.  3. Portions of pancreas obscured by gas. Visualized portions of pancreas appear normal.  4. Small right kidney. Etiology uncertain. This finding potentially may be indicative of renal artery stenosis. In this regard, question whether patient is hypertensive.    03/15/2018 Imaging    CT CAP with contrast  IMPRESSION: 1. Widespread hepatic metastasis. 2. 2.6 cm lateral right breast soft tissue nodule could represent a breast primary or an incidental benign lesion. Consider correlation with mammogram and ultrasound. 3. No definite source of primary malignancy identified within the abdomen or pelvis. There is possible rectosigmoid junction wall  thickening. Consider colonoscopy with attention to this area. 4. Distal esophageal wall thickening, suggesting esophagitis.    03/24/2018 Cancer Staging    Staging form: Breast, AJCC 8th Edition - Clinical stage from 03/24/2018: Stage IV (cT2, cN1, pM1, G3, ER+, PR+, HER2+) - Signed by [name], MD on 03/30/2018    03/24/2018 Initial Biopsy    Diagnosis 1. 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 Breast, right, needle core biopsy, 9 o'clock, 7cm from nipple - INVASIVE DUCTAL CARCINOMA. -The carcinoma is somewhat morphologically dissimilar from that in part 1. It appears grade III 3. Lymph node, needle/core biopsy, right axillary - METASTATIC CARCINOMA IN 1 OF 1 LYMPH NODE (1/1).    03/24/2018 Receptors her2    Breast biopsy: 1. Estrogen Receptor: 40%, POSITIVE, STRONG-MODERATE STAINING INTENSITY Progesterone Receptor: 70%, POSITIVE, STRONG STAINING INTENSITY Proliferation Marker Ki67: 20% HER 2 equivocal by IHC 2+, POSITIVE by FISH, ratio 2.4 and copy #4.2  2. Estrogen Receptor: 60%, POSITIVE, MODERATE STAINING INTENSITY Progesterone Receptor: 40%, POSITIVE, MODERATE STAINING INTENSITY Proliferation Marker Ki67: 20% HER2 (+) by IHC 3+    03/24/2018 Initial Diagnosis    Metastatic breast cancer ([place])    03/27/2018 Pathology Results    Diagnosis Liver, needle/core biopsy, Right - METASTATIC CARCINOMA TO LIVER, CONSISTENT WITH PATIENTS CLINICAL HISTORY OF PRIMARY BREAST CARCINOMA.  ER 80%+ PR40%+ HER2- (by [facility], IHC 2+)  Ki67 50%     03/28/2018 Pathology Results    03/28/2018 Surgical Pathology Diagnosis 1. Breast, left, needle core biopsy, 9 o'clock - FIBROCYSTIC CHANGES. - THERE IS NO EVIDENCE OF MALIGNANCY. 2. Breast, left, needle core biopsy,  2 o'clock - FIBROADENOMA. - THERE IS NO EVIDENCE OF MALIGNANCY. - SEE COMMENT.    03/29/2018 Imaging    03/29/2018 Bone Scan IMPRESSION: No  scintigraphic evidence of osseous metastatic disease.    03/30/2018 Imaging    Bone scan  IMPRESSION: No scintigraphic evidence of osseous metastatic disease.     04/07/2018 -  Chemotherapy    First line chemo weekly Taxol and herceptin/Perjeta every 3 weeks starting 04/07/18. She developed infusion reaction to taxol and it was discontinued. Added Abraxane on C1D8.       HISTORY OF PRESENTING ILLNESS:  Teresa Hays 62 y.o. female who is here with her family member. She works at [employer]. She went to see Dr. Collene Mares because she felt that there is something stuck in her throat with burning sensation in her abdomen. She then started vomiting bile. She reports losing weight and has no appetite. She finds it hard to eat as she feels nauseated after eating, she can still drink water. She can drinks smoothies and shakes. She takes omeprazole that helps. She also reports constipation and dry mouth. Her skin is also dry. She denies HA, CP, or skin rashes.   She also reports very bad lower back pain that doesn't allow her to sit on her couch after work. The pain is 7/10, and she doesn't use any pain medications.    G1P1 Age 62 with first child. 1 Child Age 61 at menarche   She has GERD and HTN. No alcohol or smoking. Her sister had breast cancer at age 39.   CURRENT THERAPY: First line chemo weekly Taxol and herceptin/Perjeta every 3 weeks starting 04/07/18. She developed infusion reaction to taxol and it was discontinued.  Changed to Abraxane on C1D8.   INTERVAL HISTORY  MARGARINE GROSSHANS is a 62 y.o. female who is here for follow-up, weekly Abraxane and cycle 2 Herceptin/Perjeta. She presents to the clinic today accompanied by her sister.  She notes she is doing well overall. She notes after last week she was nauseous and took zofran. She does not take compazine because it makes her jittery. Her diarrhea is controlled with imodium. She gets diarrhea also from her Ensure. She is eating  better, but her weight is still trending down from her baseline 168 lbs She notes she went to ED yesterday after seeing very dark colored stool. There was no blood seen in stool at ED, she denies being on stomach medication, iron pill or prior GI bleeding. She has been on Zantac in the past.  She notes skin darkness around her nose lately. She notes he pain has improved lately.  Pt and family had a trip to [place] planned before her diagnosis. Due to treatment pt will no longer be able to go and needs letter explaining diagnosis to be reimbursed for her funds.      MEDICAL HISTORY:  Past Medical History:  Diagnosis Date  . Cancer ([place])   . GERD (gastroesophageal reflux disease)   . Hypertension     SURGICAL HISTORY: Past Surgical History:  Procedure Laterality Date  . COLONOSCOPY    . ESOPHAGOGASTRODUODENOSCOPY ENDOSCOPY    . IR IMAGING GUIDED PORT INSERTION  04/04/2018    SOCIAL HISTORY: Social History   Socioeconomic History  . Marital status: Single    Spouse name: Not on file  . Number of children: Not on file  . Years of education: Not on file  . Highest education level: Not on file  Occupational History  .  Not on file  Social Needs  . Financial resource strain: Not on file  . Food insecurity:    Worry: Not on file    Inability: Not on file  . Transportation needs:    Medical: Not on file    Non-medical: Not on file  Tobacco Use  . Smoking status: Never Smoker  . Smokeless tobacco: Never Used  Substance and Sexual Activity  . Alcohol use: No  . Drug use: Never  . Sexual activity: Not on file  Lifestyle  . Physical activity:    Days per week: Not on file    Minutes per session: Not on file  . Stress: Not on file  Relationships  . Social connections:    Talks on phone: Not on file    Gets together: Not on file    Attends religious service: Not on file    Active member of club or organization: Not on file    Attends meetings of clubs or organizations: Not  on file    Relationship status: Not on file  . Intimate partner violence:    Fear of current or ex partner: Not on file    Emotionally abused: Not on file    Physically abused: Not on file    Forced sexual activity: Not on file  Other Topics Concern  . Not on file  Social History Narrative  . Not on file    FAMILY HISTORY: Family History  Problem Relation Age of Onset  . Diabetes Mother   . Cancer Sister 14       breast cancer  . Rheum arthritis Sister     ALLERGIES:  has No Known Allergies.  MEDICATIONS:  Current Outpatient Medications  Medication Sig Dispense Refill  . amLODipine (NORVASC) 5 MG tablet Take 5 mg by mouth daily.     Marland Kitchen atenolol (TENORMIN) 50 MG tablet Take 50 mg by mouth every evening.     . dronabinol (MARINOL) 2.5 MG capsule Take 1 capsule twice daily before meals as needed (Patient taking differently: Take 2.5 mg by mouth 2 (two) times daily before lunch and supper. Take 1 capsule twice daily before meals as needed appetite) 30 capsule 1  . hydrochlorothiazide (HYDRODIURIL) 25 MG tablet Take 25 mg by mouth daily.    Marland Kitchen lidocaine-prilocaine (EMLA) cream Apply to affected area once 30 g 3  . LORazepam (ATIVAN) 0.5 MG tablet Take 1 tablet (0.5 mg total) by mouth at bedtime as needed for anxiety. 30 tablet 0  . mirtazapine (REMERON) 7.5 MG tablet Take 1 tablet (7.5 mg total) by mouth at bedtime. 30 tablet 0  . ondansetron (ZOFRAN) 8 MG tablet Take 1 tablet (8 mg total) by mouth 2 (two) times daily as needed (Nausea or vomiting). 30 tablet 1  . potassium chloride SA (K-DUR,KLOR-CON) 20 MEQ tablet Take 1 tablet (20 mEq total) by mouth 2 (two) times daily. Take 1 tablet by mouth 2 times daily for 3 days, then one tablet daily. 36 tablet 1  . traMADol (ULTRAM) 50 MG tablet Take 1 tablet (50 mg total) by mouth every 6 (six) hours as needed. (Patient taking differently: Take 50 mg by mouth every 6 (six) hours as needed for moderate pain or severe pain. ) 30 tablet 1  .  omeprazole (PRILOSEC) 20 MG capsule Take 1 capsule (20 mg total) by mouth daily. 30 capsule 1   Current Facility-Administered Medications  Medication Dose Route Frequency Provider Last Rate Last Dose  . 0.9 %  sodium chloride infusion   Intravenous Continuous [name], MD   Stopped at 04/28/18 1320   Facility-Administered Medications Ordered in Other Visits  Medication Dose Route Frequency Provider Last Rate Last Dose  . ondansetron (ZOFRAN) tablet 8 mg  8 mg Oral Once [name], MD      . sodium chloride flush (NS) 0.9 % injection 10 mL  10 mL Intracatheter PRN [name], MD   10 mL at 04/28/18 1413    REVIEW OF SYSTEMS:   Constitutional: Denies fevers, chills or abnormal night sweats (+) weight loss Eyes: Denies blurriness of vision, double vision or watery eyes Ears, nose, mouth, throat, and face: Denies mucositis or sore throat Respiratory: Denies cough, dyspnea or wheezes Cardiovascular: Denies palpitation, chest discomfort or lower extremity swelling Gastrointestinal:  Denies heartburn or change in bowel habits (+) RUQ pain improved (+) Nausea (+) diarrhea controlled with imodium (+) dark colored stool  Skin: Denies abnormal skin rashes (+) dry skin, skin darkness around nose Lymphatics: Denies new lymphadenopathy or easy bruising Neurological:Denies numbness, tingling or new weaknesses Behavioral/Psych: Mood is stable, no new changes  All other systems were reviewed with the patient and are negative.  PHYSICAL EXAMINATION:  ECOG PERFORMANCE STATUS: 2 - Symptomatic, <50% confined to bed  Vitals:   04/28/18 0918  BP: 135/82  Pulse: 68  Resp: 18  Temp: 97.9 F (36.6 C)  SpO2: 100%   Filed Weights   04/28/18 0918  Weight: 134 lb 1.6 oz (60.8 kg)    GENERAL:alert, no distress and comfortable SKIN: skin color, texture, turgor are normal, no rashes or significant lesions EYES: normal, conjunctiva are pink and non-injected, sclera clear OROPHARYNX:no exudate, no erythema  and lips, buccal mucosa, and tongue normal  NECK: supple, thyroid normal size, non-tender, without nodularity LYMPH:  no palpable lymphadenopathy in the cervical, axillary or inguinal LUNGS: clear to auscultation and percussion with normal breathing effort HEART: regular rate & rhythm and no murmurs and no lower extremity edema ABDOMEN:abdomen soft, non-tender and normal bowel sounds (+) liver edge palpable 2-3 cm below costal margin Musculoskeletal:no cyanosis of digits and no clubbing  PSYCH: alert & oriented x 3 with fluent speech NEURO: no focal motor/sensory deficits BREAST: Breast exam deferred today.  LABORATORY DATA:  I have reviewed the data as listed CBC Latest Ref Rng & Units 04/28/2018 04/25/2018 04/21/2018  WBC 4.0 - 10.5 K/uL 3.2(L) 3.2(L) 5.0  Hemoglobin 12.0 - 15.0 g/dL 10.9(L) 11.4(L) 11.7  Hematocrit 36.0 - 46.0 % 32.4(L) 34.5(L) 35.9  Platelets 150 - 400 K/uL 317 268 206    CMP Latest Ref Rng & Units 04/28/2018 04/25/2018 04/21/2018  Glucose 70 - 99 mg/dL 100(H) 92 103(H)  BUN 8 - 23 mg/dL 15 16 27(H)  Creatinine 0.44 - 1.00 mg/dL 1.40(H) 1.04(H) 1.24(H)  Sodium 135 - 145 mmol/L 132(L) 135 134(L)  Potassium 3.5 - 5.1 mmol/L 3.4(L) 3.8 3.0(LL)  Chloride 98 - 111 mmol/L 96(L) 99 97(L)  CO2 22 - 32 mmol/L _0 Calcium 8.9 - 10.3 mg/dL 9.6 9.7 9.0  Total Protein 6.5 - 8.1 g/dL 7.3 7.4 7.0  Total Bilirubin 0.3 - 1.2 mg/dL 0.9 1.2 0.7  Alkaline Phos 38 - 126 U/L 236(H) 248(H) 317(H)  AST 15 - 41 U/L 127(H) 166(H) 203(HH)  ALT 0 - 44 U/L 118(H) 107(H) 93(H)    Procedures  01/30/2018 EDG     01/30/2018 Colonoscopy       05/03/2008 Colonoscopy Assessment Normal examination.  Comments: NORMAL EXAMINATION.  NO POLYPS, NO CANCERS.  PATHOLOGY  03/28/2018 Surgical Pathology Diagnosis 1. Breast, left, needle core biopsy, 9 o'clock - FIBROCYSTIC CHANGES. - THERE IS NO EVIDENCE OF MALIGNANCY. 2. Breast, left, needle core biopsy, 2 o'clock -  FIBROADENOMA. - THERE IS NO EVIDENCE OF MALIGNANCY. - SEE COMMENT. Microscopic Comment 1. and 2. The results are called to [facility] on 03/29/18. (JBK:gt, 03/29/18) Enid Cutter MD Pathologist, Electronic Signature (Case signed 03/29/2018) Specimen Gross and Clinical Information Specimen Comment 1. TIF: 315 PM; extracted < 30 sec; left breast masses 2. TIF: 325 PM; extracted < 30 sec Specimen(s) Obtained: 1. Breast, left, needle core biopsy, 9 o'clock 2. Breast, left, needle core biopsy, 2 o'clock Specimen Clinical Information 1. Prob FCC 2. Prob FCC vs FA vs PASH Gross 1. Received in formalin labeled with the patient's name and "Left breast 9:00", are 1 core(s) of tan-yellow, fibroadipose tissue, measuring 1.7 x 0.2 x 0.2 cm. The specimen is entirely submitted in 1 cassette(s). 1 of 2 FINAL for KRYSTALYN, KUBOTA ([id number]) Gross(continued) Time in formalin 3:15 pm on 03/28/18. Cold ischemic time less than 30 secs. Craig Staggers 03/28/18 ) 2. Received in formalin labeled with the patient's name and "Left breast 2:00", are 4 core(s) of tan-yellow, fibroadipose tissue, ranging from 0.4 x 0.2 x 0.2 cm to 2.1 x 0.2 x 0.2 cm. The specimen is entirely submitted in 1 cassette(s). Time in formalin 3:25 pm on 03/28/18. Cold ischemic time less than 30 secs. Craig Staggers 03/28/18 )  01/30/2018 Surgical Pathology      PROCEDURES  Baseline ECHO: 04/05/18 Impressions: - LVEF 55-60%, mild LVH, normal wall motion, grade 1 DD,   indeterminate LV filling pressure, trivial MR, normal LA size,   trivial TR, RVSP 25 mmHg, normal IVC.    RADIOGRAPHIC STUDIES: I have personally reviewed the radiological images as listed and agreed with the findings in the report.  03/29/2018 Bone Scan IMPRESSION: No scintigraphic evidence of osseous metastatic disease.  03/15/2018 CT AP IMPRESSION: 1. Widespread hepatic metastasis. 2. 2.6 cm lateral right breast soft tissue nodule could represent  a breast primary or an incidental benign lesion. Consider correlation with mammogram and ultrasound. 3. No definite source of primary malignancy identified within the abdomen or pelvis. There is possible rectosigmoid junction wall thickening. Consider colonoscopy with attention to this area. 4. Distal esophageal wall thickening, suggesting esophagitis.  03/09/2018 US Abdomen IMPRESSION: 1. Mass lesions throughout the liver, consistent with metastatic disease. Liver as a somewhat nodular contour suggesting underlying hepatic cirrhosis. Inhomogeneous echotexture to the liver.  2. Cholelithiasis with mild gallbladder wall thickening. A degree of cholecystitis cannot be excluded by ultrasound.  3. Portions of pancreas obscured by gas. Visualized portions of pancreas appear normal.  4. Small right kidney. Etiology uncertain. This finding potentially may be indicative of renal artery stenosis. In this regard, question whether patient is hypertensive.   Mm Clip Placement Left  Result Date: 03/30/2018 CLINICAL DATA:  Post biopsy mammogram of the left breast for clip placement. EXAM: DIAGNOSTIC LEFT MAMMOGRAM POST STEREOTACTIC BIOPSY COMPARISON:  Previous exam(s). FINDINGS: Mammographic images were obtained following stereotactic guided biopsy of a mass in the upper-outer left breast. The X shaped biopsy marking clip is well positioned at the site of biopsy in the upper-outer left breast. IMPRESSION: Appropriate positioning of the X shaped biopsy marking clip in the upper-outer left breast. Final Assessment: Post Procedure Mammograms for Marker Placement Electronically Signed   By: Ammie Ferrier M.D.   On: 03/30/2018 10:43  Mm Lt Breast Bx W Loc Dev 1st Lesion Image Bx Spec Stereo Guide  Addendum Date: 04/03/2018   ADDENDUM REPORT: 04/03/2018 07:38 ADDENDUM: Pathology revealed BENIGN BREAST TISSUE WITH MILD FIBROCYSTIC CHANGE of the Left breast, upper outer quadrant. This was found to be  concordant by Dr. Ammie Ferrier. Pathology results were discussed with the patient by telephone. The patient reported doing well after the biopsy with tenderness at the site. Post biopsy instructions and care were reviewed and questions were answered. The patient was encouraged to call [facility] for any additional concerns. The patient has a recent diagnosis of right breast cancer and should follow her outlined treatment plan. Dr. [name] was notified of biopsy results via EPIC message on March 31, 2018. Pathology results reported by Terie Purser, RN on 04/03/2018. Electronically Signed   By: Ammie Ferrier M.D.   On: 04/03/2018 07:38   Result Date: 04/03/2018 CLINICAL DATA:  62 year old female presenting for stereotactic biopsy of a left breast mass. EXAM: LEFT BREAST STEREOTACTIC CORE NEEDLE BIOPSY COMPARISON:  Previous exams. FINDINGS: The patient and I discussed the procedure of stereotactic-guided biopsy including benefits and alternatives. We discussed the high likelihood of a successful procedure. We discussed the risks of the procedure including infection, bleeding, tissue injury, clip migration, and inadequate sampling. Informed written consent was given. The usual time out protocol was performed immediately prior to the procedure. Using sterile technique and 1% Lidocaine as local anesthetic, under stereotactic guidance, a 9 gauge vacuum assisted device was used to perform core needle biopsy of a mass in the upper-outer quadrant of the left breast using a superior approach. Lesion quadrant: Upper-outer quadrant At the conclusion of the procedure, a X shaped tissue marker clip was deployed into the biopsy cavity. Follow-up 2-view mammogram was performed and dictated separately. IMPRESSION: Stereotactic-guided biopsy of a mass in the left breast in the upper-outer quadrant. No apparent complications. Electronically Signed: By: Ammie Ferrier M.D. On: 03/30/2018  10:42   Ir Imaging Guided Port Insertion  Result Date: 04/04/2018 CLINICAL DATA:  Metastatic breast cancer, access for chemotherapy EXAM: LEFT INTERNAL JUGULAR SINGLE LUMEN POWER PORT CATHETER INSERTION Date:  04/04/2018 04/04/2018 12:58 pm Radiologist:  Jerilynn Mages. Daryll Brod, MD Guidance:  Ultrasound and fluoroscopic MEDICATIONS: Ancef 2 g; The antibiotic was administered within an appropriate time interval prior to skin puncture. ANESTHESIA/SEDATION: Versed 2.0 mg IV; Fentanyl 100 mcg IV; Moderate Sedation Time:  30 minutes The patient was continuously monitored during the procedure by the interventional radiology nurse under my direct supervision. FLUOROSCOPY TIME:  One minutes, 18 seconds (8 mGy) COMPLICATIONS: None immediate. CONTRAST:  None. PROCEDURE: Informed consent was obtained from the patient following explanation of the procedure, risks, benefits and alternatives. The patient understands, agrees and consents for the procedure. All questions were addressed. A time out was performed. Maximal barrier sterile technique utilized including caps, mask, sterile gowns, sterile gloves, large sterile drape, hand hygiene, and 2% chlorhexidine scrub. Under sterile conditions and local anesthesia, left internal jugular micropuncture venous access was performed. Access was performed with ultrasound. Images were obtained for documentation of the patent left internal jugular vein. A guide wire was inserted followed by a transitional dilator. This allowed insertion of a guide wire and catheter into the IVC. Measurements were obtained from the SVC / RA junction back to the left IJ venotomy site. In the left infraclavicular chest, a subcutaneous pocket was created over the second anterior rib. This was done under sterile conditions and  local anesthesia. 1% lidocaine with epinephrine was utilized for this. A 2.5 cm incision was made in the skin. Blunt dissection was performed to create a subcutaneous pocket over the right  pectoralis major muscle. The pocket was flushed with saline vigorously. There was adequate hemostasis. The port catheter was assembled and checked for leakage. The port catheter was secured in the pocket with two retention sutures. The tubing was tunneled subcutaneously to the left venotomy site and inserted into the SVC/RA junction through a valved peel-away sheath. Position was confirmed with fluoroscopy. Images were obtained for documentation. The patient tolerated the procedure well. No immediate complications. Incisions were closed in a two layer fashion with 4 - 0 Vicryl suture. Dermabond was applied to the skin. The port catheter was accessed, blood was aspirated followed by saline and heparin flushes. Needle was removed. A dry sterile dressing was applied. IMPRESSION: Ultrasound and fluoroscopically guided left internal jugular single lumen power port catheter insertion. Tip in the SVC/RA junction. Catheter ready for use. Electronically Signed   By: Jerilynn Mages.  Shick M.D.   On: 04/04/2018 13:06    ASSESSMENT & PLAN:  TEMPESTT SILBA is a 62 y.o. female with history of HTN, GERD  1. 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  She presented with fatigue, anorexia, upper abdominal and back pain and weight loss.  Exam showed a palpable right breast mass, and hepatomegaly with tenderness.   -I have reviewed her recent CT, bone scan and mammogram findings.  She has 2 right breast mass, right axillary adenopathy, and a diffuse liver metastasis. All 3 biopsy showed invasive ductal carcinoma, ER PR positive, breast mass were HER-2 positive, liver biopsy were HER-2 negative. I discussed the above findings in great details with patient and her family members, copy of the results were given to the patient today. -She has started first-line chemotherapy with weekly Taxol, and dual antibody Herceptin and Perjeta every  3 weeks, for disease control.  Due to infusion reaction, Taxol was changed to Abraxane from week 2. -She tolerated chemo well with mild nausea and diarrhea, still moderately fatigued with low appetite.  Her abdominal pain has improved, which is encouraging. -She recently had very dark colored stool and presented to ED on 04/25/18 for workup, no blood was seen in stool. She has previously been on Zantac, I suggest she start Prilosec for now. Will monitor.  -Labs reviewed, Hg at 10.9, ANC at 1.1, WBC at 3.2, K at 3.4, Elevated LFTs stable, Cr at 1.4. Overall adequate to proceed with Abraxane and Herceptin/Perjeta today  -I encouraged her to drink at least 40 ounces of water/fluid a day. I will give IV fluids today  -Pt asked about being able to get medicures/pedicures. I explained there is a risk for infection and I do not recommend she have them during treatment.  -I offered her the flu shot today, she is interested. Will give flu shot in infusion room today.  -Continue chemotherapy weekly, I will see her back in 2 weeks. -Plan to repeat staging scan after 2 to 3 months of treatment.  2. Upper abdominal and back pain -She has tried Tylenol and ibuprofen, did not help her much -I previously prescribed her tramadol 50 mg every 6 hours as needed for pain. Constipation and management reviewed with her.  She has not been taking much due to the drowsiness. -I recommend her to try half tablets every 6 hours as  needed -Pain has improved since she started chemotherapy.  3. Anorexia, Nausea, Diarrhea and weight loss  -Secondary to metastatic cancer.  Baseline at 168lbs -I have previously prescribed mirtazapine, she has not started, but agrees to try  -She had tremors with compazine, she stopped using it and has good relief with zofran. Given her increase of Nausea, I suggest she try half tablet compazine as needed and she can increase Zofran to 3-4 times a day.  -She has been seen by dietician Annamaria Boots.   -As Ensure effects her diarrhea, I recommend she try boost.  -Diarrhea controlled with imodium.  -Her weight continues to trend down. I recommend she increase her nutritional supplement up to 3 times a day and increase her carbohydrate intake.   4. Social support  -She lives alone, has her sister and niece in [place]. Her daughter lives in a few hours away  -SW referral  -I previously reordered her letter for her to be off work due to the cancer diagnosis and chemo treatment -I will fill out her daughter's FMLA paperwork -She has been anxious about diagnosis and treatment. On 04/14/18 NP Lacie prescribed her Ativan to take as needed.   5. Goal of care discussion  -We again discussed the incurable nature of her cancer, and the overall poor prognosis, especially if she does not have good response to chemotherapy or progress on chemo -The patient understands the goal of care is palliative. -she is full code   6.  Transaminitis -Secondary to her recent liver metastasis. -Overall has improved some since she started chemotherapy -Continue monitoring closely.  7.  Anemia -Abrupt anemia since she started chemotherapy, she also reports episode of melena which spontaneously resolved. -I will call in Prilosec for her today -continue monitoring, will consider blood transfusion if hemoglobin less than 8, always symptomatic anemia.  Plan: -Labs reviewed and overall adequate to proceed with Abraxane and Herceptin/Perjeta today, continue Abraxane weekly. -Flu shot in infusion room today  -F/u in 2 weeks  -Per Pt request, I will provide letter to airline for cancelled trip   Orders Placed This Encounter  Procedures  . ABO/Rh    All questions were answered. The patient knows to call the clinic with any problems, questions or concerns. I spent 25 minutes counseling the patient face to face. The total time spent in the appointment was 30 minutes and more than 50% was on counseling.  Oneal Deputy, am acting as scribe for [name], MD.   I have reviewed the above documentation for accuracy and completeness, and I agree with the above.       [name], MD 04/28/2018

## 2018-04-28 NOTE — Progress Notes (Signed)
Okay to treat per Dr. Burr Medico based on today's lab results.

## 2018-05-05 ENCOUNTER — Inpatient Hospital Stay: Payer: 59

## 2018-05-05 ENCOUNTER — Telehealth: Payer: Self-pay | Admitting: General Practice

## 2018-05-05 ENCOUNTER — Inpatient Hospital Stay: Payer: 59 | Admitting: Nutrition

## 2018-05-05 VITALS — BP 138/88 | HR 63 | Temp 97.8°F | Resp 18 | Ht 64.0 in | Wt 130.8 lb

## 2018-05-05 DIAGNOSIS — Z170 Estrogen receptor positive status [ER+]: Secondary | ICD-10-CM | POA: Diagnosis not present

## 2018-05-05 DIAGNOSIS — Z5111 Encounter for antineoplastic chemotherapy: Secondary | ICD-10-CM | POA: Diagnosis not present

## 2018-05-05 DIAGNOSIS — R197 Diarrhea, unspecified: Secondary | ICD-10-CM | POA: Diagnosis not present

## 2018-05-05 DIAGNOSIS — Z7189 Other specified counseling: Secondary | ICD-10-CM

## 2018-05-05 DIAGNOSIS — E876 Hypokalemia: Secondary | ICD-10-CM

## 2018-05-05 DIAGNOSIS — C50919 Malignant neoplasm of unspecified site of unspecified female breast: Secondary | ICD-10-CM

## 2018-05-05 DIAGNOSIS — C50411 Malignant neoplasm of upper-outer quadrant of right female breast: Secondary | ICD-10-CM | POA: Diagnosis not present

## 2018-05-05 DIAGNOSIS — D649 Anemia, unspecified: Secondary | ICD-10-CM | POA: Diagnosis not present

## 2018-05-05 DIAGNOSIS — Z95828 Presence of other vascular implants and grafts: Secondary | ICD-10-CM

## 2018-05-05 DIAGNOSIS — R740 Nonspecific elevation of levels of transaminase and lactic acid dehydrogenase [LDH]: Secondary | ICD-10-CM | POA: Diagnosis not present

## 2018-05-05 DIAGNOSIS — C787 Secondary malignant neoplasm of liver and intrahepatic bile duct: Secondary | ICD-10-CM | POA: Diagnosis not present

## 2018-05-05 DIAGNOSIS — R300 Dysuria: Secondary | ICD-10-CM | POA: Diagnosis not present

## 2018-05-05 LAB — CMP (CANCER CENTER ONLY)
ALT: 48 U/L — ABNORMAL HIGH (ref 0–44)
AST: 57 U/L — ABNORMAL HIGH (ref 15–41)
Albumin: 3.9 g/dL (ref 3.5–5.0)
Alkaline Phosphatase: 178 U/L — ABNORMAL HIGH (ref 38–126)
Anion gap: 13 (ref 5–15)
BUN: 14 mg/dL (ref 8–23)
CO2: 22 mmol/L (ref 22–32)
Calcium: 9.7 mg/dL (ref 8.9–10.3)
Chloride: 97 mmol/L — ABNORMAL LOW (ref 98–111)
Creatinine: 1.17 mg/dL — ABNORMAL HIGH (ref 0.44–1.00)
GFR, Est AFR Am: 57 mL/min — ABNORMAL LOW (ref >60)
GFR, Estimated: 49 mL/min — ABNORMAL LOW (ref >60)
Glucose, Bld: 96 mg/dL (ref 70–99)
Potassium: 3.2 mmol/L — ABNORMAL LOW (ref 3.5–5.1)
Sodium: 132 mmol/L — ABNORMAL LOW (ref 135–145)
Total Bilirubin: 0.6 mg/dL (ref 0.3–1.2)
Total Protein: 7.1 g/dL (ref 6.5–8.1)

## 2018-05-05 LAB — CBC WITH DIFFERENTIAL (CANCER CENTER ONLY)
Abs Immature Granulocytes: 0 10*3/uL (ref 0.00–0.07)
Basophils Absolute: 0.1 10*3/uL (ref 0.0–0.1)
Basophils Relative: 2 %
Eosinophils Absolute: 0 10*3/uL (ref 0.0–0.5)
Eosinophils Relative: 0 %
HCT: 31 % — ABNORMAL LOW (ref 36.0–46.0)
Hemoglobin: 10.5 g/dL — ABNORMAL LOW (ref 12.0–15.0)
Immature Granulocytes: 0 %
Lymphocytes Relative: 52 %
Lymphs Abs: 1.5 10*3/uL (ref 0.7–4.0)
MCH: 27.8 pg (ref 26.0–34.0)
MCHC: 33.9 g/dL (ref 30.0–36.0)
MCV: 82 fL (ref 80.0–100.0)
Monocytes Absolute: 0.2 10*3/uL (ref 0.1–1.0)
Monocytes Relative: 5 %
Neutro Abs: 1.2 10*3/uL — ABNORMAL LOW (ref 1.7–7.7)
Neutrophils Relative %: 41 %
Platelet Count: 310 10*3/uL (ref 150–400)
RBC: 3.78 MIL/uL — ABNORMAL LOW (ref 3.87–5.11)
RDW: 13.9 % (ref 11.5–15.5)
WBC Count: 2.9 10*3/uL — ABNORMAL LOW (ref 4.0–10.5)
nRBC: 0 % (ref 0.0–0.2)

## 2018-05-05 MED ORDER — SODIUM CHLORIDE 0.9% FLUSH
10.0000 mL | INTRAVENOUS | Status: DC | PRN
  Administered 2018-05-05: 10 mL
  Filled 2018-05-05: qty 10

## 2018-05-05 MED ORDER — PROCHLORPERAZINE MALEATE 10 MG PO TABS
ORAL_TABLET | ORAL | Status: AC
  Filled 2018-05-05: qty 1

## 2018-05-05 MED ORDER — POTASSIUM CHLORIDE CRYS ER 20 MEQ PO TBCR
EXTENDED_RELEASE_TABLET | ORAL | Status: AC
  Filled 2018-05-05: qty 1

## 2018-05-05 MED ORDER — ONDANSETRON HCL 8 MG PO TABS
ORAL_TABLET | ORAL | Status: AC
  Filled 2018-05-05: qty 1

## 2018-05-05 MED ORDER — POTASSIUM CHLORIDE CRYS ER 20 MEQ PO TBCR
20.0000 meq | EXTENDED_RELEASE_TABLET | Freq: Once | ORAL | Status: AC
  Administered 2018-05-05: 20 meq via ORAL

## 2018-05-05 MED ORDER — HEPARIN SOD (PORK) LOCK FLUSH 100 UNIT/ML IV SOLN
500.0000 [IU] | Freq: Once | INTRAVENOUS | Status: AC | PRN
  Administered 2018-05-05: 500 [IU]
  Filled 2018-05-05: qty 5

## 2018-05-05 MED ORDER — PACLITAXEL PROTEIN-BOUND CHEMO INJECTION 100 MG
80.0000 mg/m2 | Freq: Once | INTRAVENOUS | Status: AC
  Administered 2018-05-05: 125 mg via INTRAVENOUS
  Filled 2018-05-05: qty 25

## 2018-05-05 MED ORDER — ONDANSETRON HCL 8 MG PO TABS: 8.0000 mg | ORAL_TABLET | Freq: Once | ORAL | Status: DC

## 2018-05-05 MED ORDER — PROCHLORPERAZINE MALEATE 10 MG PO TABS
10.0000 mg | ORAL_TABLET | Freq: Once | ORAL | Status: AC
  Administered 2018-05-05: 10 mg via ORAL

## 2018-05-05 MED ORDER — SODIUM CHLORIDE 0.9 % IV SOLN
Freq: Once | INTRAVENOUS | Status: AC
  Administered 2018-05-05: 11:00:00 via INTRAVENOUS
  Filled 2018-05-05: qty 250

## 2018-05-05 NOTE — Telephone Encounter (Signed)
[facility] CSW Progress Note  Call from family member concerned that patient's lack of interest in food may be related to underlying depression, wants to know of resources for patient.  CSW called patient, states that she "does not feel like eating, it just doesn't taste good."  Rates her mood as "6 out of 10", an increase from day of diagnosis where she rated her mood as "4 out of 10."  Likes to talk to family members, read her Bible and play games in order to manage anxiety. Especially depends on support of family members.  Interested in options for support group and possible counseling referral for [facility] resources.  CSW will mail information packet, encouraged patient to access resources as needed.  Edwyna Shell, LCSW Clinical Social Worker Phone:  [phone]

## 2018-05-05 NOTE — Progress Notes (Signed)
Per NP Kalman Shan, ok to tx with labs from today.

## 2018-05-05 NOTE — Patient Instructions (Signed)

## 2018-05-05 NOTE — Progress Notes (Signed)
Nutrition follow up completed with patient and niece. Patient receiving treatment for metastatic breast cancer. Weight decreased and documented as 130.7 pounds today from 134 pounds. Labs reviewed. Patient reports poor appetite. She is confused on how to take all her medications.  She has not been taking nausea medications or appetite stimulants as ordered. Confirms diarrhea after one bottle of Ensure.  Nutrition Diagnosis: Unintended weight loss continues.  Intervention: Educated patient on strategies to increase meals/snacks frequently throughout day. Recommended she try 4 oz of Ensure over 30 minutes twice daily and assess tolerance. Also recommended CIB as an alternative supplement.  RN and Pharmacy are clarifying medication timing. Fact sheet given on poor appetite. Questions answered and teach back used.  Monitoring, Evaluation, Goals: Patient will tolerate increased calories and protein to minimize weight loss.  Next Visit: To be scheduled.

## 2018-05-08 NOTE — Progress Notes (Signed)
[place]  [phone] [phone]  Clinic Follow-up Note   Patient Care Team: Levin Bacon, NP as PCP - General (Family Medicine) [name], MD as Consulting Physician (Gastroenterology) [name], MD as Consulting Physician (Hematology)   Date of Service:  05/12/2018   CHIEF COMPLAINTS:  F/u metastatic breast cancer  Oncology History   Cancer Staging Metastatic breast cancer [facility]) Staging form: Breast, AJCC 8th Edition - Clinical stage from 03/24/2018: Stage IV (cT2, cN1, pM1, G3, ER+, PR+, HER2+) - Signed by [name], MD on 03/30/2018       Metastatic breast cancer ([place])   01/30/2018 Procedure    Colonoscopy showed small polyp in the sigmoid colon, removed, the exam of colon including the terminal ileum was otherwise negative.    01/30/2018 Procedure    EGD by Dr. Collene Mares showed small hiatal hernia, a 8 mm polypoid lesion in the cardia, biopsied.    03/09/2018 Imaging    03/09/2018 US Abdomen IMPRESSION: 1. Mass lesions throughout the liver, consistent with metastatic disease. Liver as a somewhat nodular contour suggesting underlying hepatic cirrhosis. Inhomogeneous echotexture to the liver.  2. Cholelithiasis with mild gallbladder wall thickening. A degree of cholecystitis cannot be excluded by ultrasound.  3. Portions of pancreas obscured by gas. Visualized portions of pancreas appear normal.  4. Small right kidney. Etiology uncertain. This finding potentially may be indicative of renal artery stenosis. In this regard, question whether patient is hypertensive.    03/15/2018 Imaging    CT CAP with contrast  IMPRESSION: 1. Widespread hepatic metastasis. 2. 2.6 cm lateral right breast soft tissue nodule could represent a breast primary or an incidental benign lesion. Consider correlation with mammogram and ultrasound. 3. No definite source of primary malignancy identified within the abdomen or pelvis. There is possible  rectosigmoid junction wall thickening. Consider colonoscopy with attention to this area. 4. Distal esophageal wall thickening, suggesting esophagitis.    03/24/2018 Cancer Staging    Staging form: Breast, AJCC 8th Edition - Clinical stage from 03/24/2018: Stage IV (cT2, cN1, pM1, G3, ER+, PR+, HER2+) - Signed by [name], MD on 03/30/2018    03/24/2018 Initial Biopsy    Diagnosis 1. 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 Breast, right, needle core biopsy, 9 o'clock, 7cm from nipple - INVASIVE DUCTAL CARCINOMA. -The carcinoma is somewhat morphologically dissimilar from that in part 1. It appears grade III 3. Lymph node, needle/core biopsy, right axillary - METASTATIC CARCINOMA IN 1 OF 1 LYMPH NODE (1/1).    03/24/2018 Receptors her2    Breast biopsy: 1. Estrogen Receptor: 40%, POSITIVE, STRONG-MODERATE STAINING INTENSITY Progesterone Receptor: 70%, POSITIVE, STRONG STAINING INTENSITY Proliferation Marker Ki67: 20% HER 2 equivocal by IHC 2+, POSITIVE by FISH, ratio 2.4 and copy #4.2  2. Estrogen Receptor: 60%, POSITIVE, MODERATE STAINING INTENSITY Progesterone Receptor: 40%, POSITIVE, MODERATE STAINING INTENSITY Proliferation Marker Ki67: 20% HER2 (+) by IHC 3+    03/24/2018 Initial Diagnosis    Metastatic breast cancer ([place])    03/27/2018 Pathology Results    Diagnosis Liver, needle/core biopsy, Right - METASTATIC CARCINOMA TO LIVER, CONSISTENT WITH PATIENTS CLINICAL HISTORY OF PRIMARY BREAST CARCINOMA.  ER 80%+ PR40%+ HER2- (by [facility], IHC 2+)  Ki67 50%     03/28/2018 Pathology Results    03/28/2018 Surgical Pathology Diagnosis 1. Breast, left, needle core biopsy, 9 o'clock - FIBROCYSTIC CHANGES. - THERE IS NO EVIDENCE OF MALIGNANCY.  2. Breast, left, needle core biopsy, 2 o'clock - FIBROADENOMA. - THERE IS NO EVIDENCE OF MALIGNANCY. - SEE COMMENT.    03/29/2018 Imaging    03/29/2018  Bone Scan IMPRESSION: No scintigraphic evidence of osseous metastatic disease.    03/30/2018 Imaging    Bone scan  IMPRESSION: No scintigraphic evidence of osseous metastatic disease.     04/07/2018 -  Chemotherapy    First line chemo weekly Taxol and herceptin/Perjeta every 3 weeks starting 04/07/18. She developed infusion reaction to taxol and it was discontinued. Added Abraxane on C1D8.       HISTORY OF PRESENTING ILLNESS:  Teresa Hays 62 y.o. female who is here with her family member. She works at [employer]. She went to see Dr. Collene Mares because she felt that there is something stuck in her throat with burning sensation in her abdomen. She then started vomiting bile. She reports losing weight and has no appetite. She finds it hard to eat as she feels nauseated after eating, she can still drink water. She can drinks smoothies and shakes. She takes omeprazole that helps. She also reports constipation and dry mouth. Her skin is also dry. She denies HA, CP, or skin rashes.   She also reports very bad lower back pain that doesn't allow her to sit on her couch after work. The pain is 7/10, and she doesn't use any pain medications.    G1P1 Age 36 with first child. 1 Child Age 78 at menarche   She has GERD and HTN. No alcohol or smoking. Her sister had breast cancer at age 43.   CURRENT THERAPY: First line chemo weekly Taxol and Herceptin/Perjeta every 3 weeks starting 04/07/18. She developed infusion reaction to taxol and it was discontinued. Changed to Abraxane on C1D8.   INTERVAL HISTORY  Teresa Hays is a 62 y.o. female who is here for follow-up and weekly Abraxane. She presents to the clinic today accompanied by her sister.  She notes she continues to lose weight, 6 pounds, but she has been eating more this week. She has not been drinking the ensure this week due to increase in eating. She tries not to because it causes her diarrhea. She will try carnation milk.  She notes  she has dysuria for the past week and no vaginal discharge. She will give Urine culture. She notes she has not been as nauseous as she has been eating more.  She has not been working lately. She has applied for disability and it is pending. Her plans for returning to work are pending.   She notes still having darkening of her nose due to rash secondary to treatment. She denies neuropathy.        MEDICAL HISTORY:  Past Medical History:  Diagnosis Date  . Cancer ([place])   . GERD (gastroesophageal reflux disease)   . Hypertension     SURGICAL HISTORY: Past Surgical History:  Procedure Laterality Date  . COLONOSCOPY    . ESOPHAGOGASTRODUODENOSCOPY ENDOSCOPY    . IR IMAGING GUIDED PORT INSERTION  04/04/2018    SOCIAL HISTORY: Social History   Socioeconomic History  . Marital status: Single    Spouse name: Not on file  . Number of children: Not on file  . Years of education: Not on file  . Highest education level: Not on file  Occupational History  . Not on file  Social Needs  . Financial resource strain: Not on file  . Food insecurity:    Worry: Not on  file    Inability: Not on file  . Transportation needs:    Medical: Not on file    Non-medical: Not on file  Tobacco Use  . Smoking status: Never Smoker  . Smokeless tobacco: Never Used  Substance and Sexual Activity  . Alcohol use: No  . Drug use: Never  . Sexual activity: Not on file  Lifestyle  . Physical activity:    Days per week: Not on file    Minutes per session: Not on file  . Stress: Not on file  Relationships  . Social connections:    Talks on phone: Not on file    Gets together: Not on file    Attends religious service: Not on file    Active member of club or organization: Not on file    Attends meetings of clubs or organizations: Not on file    Relationship status: Not on file  . Intimate partner violence:    Fear of current or ex partner: Not on file    Emotionally abused: Not on file     Physically abused: Not on file    Forced sexual activity: Not on file  Other Topics Concern  . Not on file  Social History Narrative  . Not on file    FAMILY HISTORY: Family History  Problem Relation Age of Onset  . Diabetes Mother   . Cancer Sister 76       breast cancer  . Rheum arthritis Sister     ALLERGIES:  has No Known Allergies.  MEDICATIONS:  Current Outpatient Medications  Medication Sig Dispense Refill  . amLODipine (NORVASC) 5 MG tablet Take 5 mg by mouth daily.     Marland Kitchen atenolol (TENORMIN) 50 MG tablet Take 50 mg by mouth every evening.     . dronabinol (MARINOL) 2.5 MG capsule Take 1 capsule twice daily before meals as needed (Patient taking differently: Take 2.5 mg by mouth 2 (two) times daily before lunch and supper. Take 1 capsule twice daily before meals as needed appetite) 30 capsule 1  . hydrochlorothiazide (HYDRODIURIL) 25 MG tablet Take 25 mg by mouth daily.    Marland Kitchen lidocaine-prilocaine (EMLA) cream Apply to affected area once 30 g 3  . LORazepam (ATIVAN) 0.5 MG tablet Take 1 tablet (0.5 mg total) by mouth at bedtime as needed for anxiety. 30 tablet 0  . mirtazapine (REMERON) 7.5 MG tablet Take 1 tablet (7.5 mg total) by mouth at bedtime. 30 tablet 0  . omeprazole (PRILOSEC) 20 MG capsule Take 1 capsule (20 mg total) by mouth daily. 30 capsule 1  . ondansetron (ZOFRAN) 8 MG tablet Take 1 tablet (8 mg total) by mouth 2 (two) times daily as needed (Nausea or vomiting). 30 tablet 1  . potassium chloride SA (K-DUR,KLOR-CON) 20 MEQ tablet Take 1 tablet (20 mEq total) by mouth 2 (two) times daily. Take 1 tablet by mouth 2 times daily for 3 days, then one tablet daily. 36 tablet 1  . traMADol (ULTRAM) 50 MG tablet Take 1 tablet (50 mg total) by mouth every 6 (six) hours as needed. (Patient taking differently: Take 50 mg by mouth every 6 (six) hours as needed for moderate pain or severe pain. ) 30 tablet 1   No current facility-administered medications for this visit.     Facility-Administered Medications Ordered in Other Visits  Medication Dose Route Frequency Provider Last Rate Last Dose  . heparin lock flush 100 unit/mL  500 Units Intracatheter Once PRN [name], MD      .  sodium chloride flush (NS) 0.9 % injection 10 mL  10 mL Intracatheter PRN [name], MD        REVIEW OF SYSTEMS:   Constitutional: Denies fevers, chills or abnormal night sweats (+) weight loss, but improved eating Eyes: Denies blurriness of vision, double vision or watery eyes Ears, nose, mouth, throat, and face: Denies mucositis or sore throat Respiratory: Denies cough, dyspnea or wheezes Cardiovascular: Denies palpitation, chest discomfort or lower extremity swelling Gastrointestinal:  Denies heartburn or change in bowel habits (+) RUQ pain improved (+) Nausea, improved (+) diarrhea controlled with imodium   GU: (+) Dysuria  Skin: Denies abnormal skin rashes (+) dry skin, skin darkness around nose  Lymphatics: Denies new lymphadenopathy or easy bruising Neurological:Denies numbness, tingling or new weaknesses Behavioral/Psych: Mood is stable, no new changes  All other systems were reviewed with the patient and are negative.  PHYSICAL EXAMINATION:  ECOG PERFORMANCE STATUS: 2 - Symptomatic, <50% confined to bed  Vitals:   05/12/18 0928  BP: 130/85  Pulse: 68  Resp: 18  Temp: 97.8 F (36.6 C)  SpO2: 100%   Filed Weights   05/12/18 0928  Weight: 128 lb 8 oz (58.3 kg)    GENERAL:alert, no distress and comfortable SKIN: skin color, texture, turgor are normal, no rashes or significant lesions EYES: normal, conjunctiva are pink and non-injected, sclera clear OROPHARYNX:no exudate, no erythema and lips, buccal mucosa, and tongue normal  NECK: supple, thyroid normal size, non-tender, without nodularity LYMPH:  no palpable lymphadenopathy in the cervical, axillary or inguinal LUNGS: clear to auscultation and percussion with normal breathing effort HEART: regular rate &  rhythm and no murmurs and no lower extremity edema ABDOMEN:abdomen soft, non-tender and normal bowel sounds (+) liver edge palpable previously at 2-3 cm below costal margin, slightly smaller now (+) abdominal tenderness upon palpation Musculoskeletal:no cyanosis of digits and no clubbing  PSYCH: alert & oriented x 3 with fluent speech NEURO: no focal motor/sensory deficits BREAST: Breast exam deferred today.  LABORATORY DATA:  I have reviewed the data as listed CBC Latest Ref Rng & Units 05/12/2018 05/05/2018 04/28/2018  WBC 4.0 - 10.5 K/uL 3.4(L) 2.9(L) 3.2(L)  Hemoglobin 12.0 - 15.0 g/dL 10.3(L) 10.5(L) 10.9(L)  Hematocrit 36.0 - 46.0 % 31.4(L) 31.0(L) 32.4(L)  Platelets 150 - 400 K/uL 326 310 317    CMP Latest Ref Rng & Units 05/12/2018 05/05/2018 04/28/2018  Glucose 70 - 99 mg/dL 102(H) 96 100(H)  BUN 8 - 23 mg/dL _0 Creatinine 0.44 - 1.00 mg/dL 1.26(H) 1.17(H) 1.40(H)  Sodium 135 - 145 mmol/L 137 132(L) 132(L)  Potassium 3.5 - 5.1 mmol/L 2.7(LL) 3.2(L) 3.4(L)  Chloride 98 - 111 mmol/L 103 97(L) 96(L)  CO2 22 - 32 mmol/L _1 Calcium 8.9 - 10.3 mg/dL 9.6 9.7 9.6  Total Protein 6.5 - 8.1 g/dL 7.2 7.1 7.3  Total Bilirubin 0.3 - 1.2 mg/dL 0.5 0.6 0.9  Alkaline Phos 38 - 126 U/L 149(H) 178(H) 236(H)  AST 15 - 41 U/L 47(H) 57(H) 127(H)  ALT 0 - 44 U/L 36 48(H) 118(H)    Procedures  01/30/2018 EDG     01/30/2018 Colonoscopy       05/03/2008 Colonoscopy Assessment Normal examination.  Comments: NORMAL EXAMINATION.  NO POLYPS, NO CANCERS.  PATHOLOGY  03/28/2018 Surgical Pathology Diagnosis 1. Breast, left, needle core biopsy, 9 o'clock - FIBROCYSTIC CHANGES. - THERE IS NO EVIDENCE OF MALIGNANCY. 2. Breast, left, needle core biopsy, 2 o'clock - FIBROADENOMA. - THERE  IS NO EVIDENCE OF MALIGNANCY. - SEE COMMENT. Microscopic Comment 1. and 2. The results are called to [facility] on 03/29/18. (JBK:gt, 03/29/18) Enid Cutter  MD Pathologist, Electronic Signature (Case signed 03/29/2018) Specimen Gross and Clinical Information Specimen Comment 1. TIF: 315 PM; extracted < 30 sec; left breast masses 2. TIF: 325 PM; extracted < 30 sec Specimen(s) Obtained: 1. Breast, left, needle core biopsy, 9 o'clock 2. Breast, left, needle core biopsy, 2 o'clock Specimen Clinical Information 1. Prob FCC 2. Prob FCC vs FA vs PASH Gross 1. Received in formalin labeled with the patient's name and "Left breast 9:00", are 1 core(s) of tan-yellow, fibroadipose tissue, measuring 1.7 x 0.2 x 0.2 cm. The specimen is entirely submitted in 1 cassette(s). 1 of 2 FINAL for JANEANN, PAISLEY ([id number]) Gross(continued) Time in formalin 3:15 pm on 03/28/18. Cold ischemic time less than 30 secs. Craig Staggers 03/28/18 ) 2. Received in formalin labeled with the patient's name and "Left breast 2:00", are 4 core(s) of tan-yellow, fibroadipose tissue, ranging from 0.4 x 0.2 x 0.2 cm to 2.1 x 0.2 x 0.2 cm. The specimen is entirely submitted in 1 cassette(s). Time in formalin 3:25 pm on 03/28/18. Cold ischemic time less than 30 secs. Craig Staggers 03/28/18 )  01/30/2018 Surgical Pathology      PROCEDURES  Baseline ECHO: 04/05/18 Impressions: - LVEF 55-60%, mild LVH, normal wall motion, grade 1 DD,   indeterminate LV filling pressure, trivial MR, normal LA size,   trivial TR, RVSP 25 mmHg, normal IVC.    RADIOGRAPHIC STUDIES: I have personally reviewed the radiological images as listed and agreed with the findings in the report.  03/29/2018 Bone Scan IMPRESSION: No scintigraphic evidence of osseous metastatic disease.  03/15/2018 CT AP IMPRESSION: 1. Widespread hepatic metastasis. 2. 2.6 cm lateral right breast soft tissue nodule could represent a breast primary or an incidental benign lesion. Consider correlation with mammogram and ultrasound. 3. No definite source of primary malignancy identified within the abdomen or pelvis. There is possible  rectosigmoid junction wall thickening. Consider colonoscopy with attention to this area. 4. Distal esophageal wall thickening, suggesting esophagitis.  03/09/2018 US Abdomen IMPRESSION: 1. Mass lesions throughout the liver, consistent with metastatic disease. Liver as a somewhat nodular contour suggesting underlying hepatic cirrhosis. Inhomogeneous echotexture to the liver.  2. Cholelithiasis with mild gallbladder wall thickening. A degree of cholecystitis cannot be excluded by ultrasound.  3. Portions of pancreas obscured by gas. Visualized portions of pancreas appear normal.  4. Small right kidney. Etiology uncertain. This finding potentially may be indicative of renal artery stenosis. In this regard, question whether patient is hypertensive.   No results found.  ASSESSMENT & PLAN:  Teresa Hays is a 62 y.o. female with history of HTN, GERD  1. 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  She presented with fatigue, anorexia, upper abdominal and back pain and weight loss.  Exam showed a palpable right breast mass, and hepatomegaly with tenderness.   -I have reviewed her recent CT, bone scan and mammogram findings.  She has 2 right breast mass, right axillary adenopathy, and a diffuse liver metastasis. All 3 biopsy showed invasive ductal carcinoma, ER PR positive, breast mass were HER-2 positive, liver biopsy were HER-2 negative. I discussed the above findings in great details with patient and her family members, copy of the results were given to the patient today. -She has started first-line chemotherapy with  weekly Taxol, and dual antibody Herceptin and Perjeta every 3 weeks, for disease control on 04/07/18. Due to infusion reaction, Taxol was changed to Abraxane from week 2. -She tolerates chemo well with mild nausea and diarrhea, still moderately fatigued with low appetite.   Her abdominal pain has improved, which is encouraging. -She recently had very dark colored stool and presented to ED on 04/25/18 for workup, no blood was seen in stool. She has previously been on Zantac, I previously suggested she start Prilosec for now. Will monitor.  -She received flu shot on 04/28/18 -S/p 2 cycles she has hair loss. I will give a wig prescription today.  -She is tolerating chemotherapy well, her abdominal pain has much improved, which is encouraging, likely she is responding to chemotherapy.  Exam also showed less enlarged and tender liver.  Labs reviewed, WBC at 3.4, Hg at 10.3, ANC at 1.6, K at 2.7, Cr at 1.26, LFTs have improved. CA 27.29 is still pending. Overall adequate to proceed with Abraxane today  -Will increase oral potassium to BID. Will give IV potassium in infusion today  -Repeat CT scan after 3 cycles in 06/2018 -I encouraged her to drink at least 40 ounces of water/fluid a day.  -F/u every other week  2. Upper abdominal and back pain -She has tried Tylenol and ibuprofen, did not help her much -I previously prescribed her tramadol 50 mg every 6 hours as needed for pain. Constipation and management reviewed with her.  She has not been taking much due to the drowsiness. -I recommend her to try half tablets every 6 hours as needed -Pain has improved since she started chemotherapy.  She takes tramadol a few times a week.  3. Anorexia, Nausea, Diarrhea and weight loss  -Secondary to metastatic cancer.  Baseline at 168lbs -I have previously prescribed mirtazapine, she has not started, but agrees to try  -She had tremors with compazine, she stopped using it and has good relief with zofran. Given her increase of Nausea, I suggest she try half tablet compazine as needed and she can increase Zofran to 3-4 times a day. She can use as needed.  -She has been seen by dietician Annamaria Boots.  -As Ensure effects her diarrhea, I recommend she try boost or carnation breakfast.    -Diarrhea controlled with imodium. Nausea has improved with eating.  -Her weight continues to trend down. I recommended she increase her nutritional supplement like Carnation breakfast up to 3 times a day and increase her carbohydrate intake.   4. Social support  -She lives alone, has her sister and niece in [name]. Her daughter lives in a few hours away  -SW referral  -I previously reordered her letter for her to be off work due to the cancer diagnosis and chemo treatment -I will fill out her daughter's FMLA paperwork -She has been anxious about diagnosis and treatment. On 04/14/18 NP Lacie prescribed her Ativan to take as needed.   5. Goal of care discussion  -We again discussed the incurable nature of her cancer, and the overall poor prognosis, especially if she does not have good response to chemotherapy or progress on chemo -The patient understands the goal of care is palliative. -she is full code   6.  Transaminitis -Secondary to her recent liver metastasis. -Overall has improved some since she started chemotherapy -Continue monitoring closely. -Has improved lately. Liver palpated smaller on physical exam today. (05/12/18)  7.  Anemia -Abrupt anemia since she started chemotherapy, she also reports episode  of melena which spontaneously resolved. -I previously prescribed her Prilosec on 04/28/18 -Continue monitoring, will consider blood transfusion if hemoglobin less than 8, always symptomatic anemia. -Hg at 10.3 today (05/12/18)  8. Hypokalemia -K at 2.7 today, will increase oral potassium to BID for now. She will received oral 55mq KCL in the infusion room  9. Elevated Creatinine  -Cr slightly increased to 1.26 -Will hold her HCTZ for now.   10. Dysuria -Will do UA today to rule our UTI. If UTI, I will call in antibiotics.    Plan: -UA and urine culture today, I will call in antibiotics if needed -Labs reviewed and overall adequate to proceed with Abraxane today,  C2D15 -continue chemi weekly and see uKoreaevery 2 weeks  -CT CAP with contrast in 5-6 weeks before see me in 6 weeks     Orders Placed This Encounter  Procedures  . Urine Culture    Standing Status:   Future    Standing Expiration Date:   05/12/2019  . CT Abdomen Pelvis W Contrast    Standing Status:   Future    Standing Expiration Date:   05/12/2019    Order Specific Question:   If indicated for the ordered procedure, I authorize the administration of contrast media per Radiology protocol    Answer:   Yes    Order Specific Question:   Preferred imaging location?    Answer:   [facility]   Order Specific Question:   Is Oral Contrast requested for this exam?    Answer:   Yes, Per Radiology protocol    Order Specific Question:   Radiology Contrast Protocol - do NOT remove file path    Answer:   \\[email or web address]  . CT Chest W Contrast    Please hold iv contrast if GFR<50    Standing Status:   Future    Standing Expiration Date:   05/12/2019    Order Specific Question:   If indicated for the ordered procedure, I authorize the administration of contrast media per Radiology protocol    Answer:   Yes    Order Specific Question:   Preferred imaging location?    Answer[facility]   [facility]   Order Specific Question:   Radiology Contrast Protocol - do NOT remove file path    Answer:   \\[email or web address]  . Urinalysis, Complete w Microscopic    Standing Status:   Future    Standing Expiration Date:   05/13/2019    All questions were answered. The patient knows to call the clinic with any problems, questions or concerns. I spent 25 minutes counseling the patient face to face. The total time spent in the appointment was 30 minutes and more than 50% was on counseling.  IOneal Deputy am acting as scribe for YTruitt Merle MD.   I have reviewed the above documentation for accuracy and completeness, and I agree with the  above.      YTruitt Merle MD 05/12/2018

## 2018-05-12 ENCOUNTER — Encounter: Payer: Self-pay | Admitting: Hematology

## 2018-05-12 ENCOUNTER — Ambulatory Visit: Payer: 59

## 2018-05-12 ENCOUNTER — Inpatient Hospital Stay: Payer: 59

## 2018-05-12 ENCOUNTER — Inpatient Hospital Stay (HOSPITAL_BASED_OUTPATIENT_CLINIC_OR_DEPARTMENT_OTHER): Payer: 59 | Admitting: Hematology

## 2018-05-12 ENCOUNTER — Other Ambulatory Visit: Payer: Self-pay | Admitting: Hematology

## 2018-05-12 VITALS — BP 130/85 | HR 68 | Temp 97.8°F | Resp 18 | Ht 64.0 in | Wt 128.5 lb

## 2018-05-12 DIAGNOSIS — C50411 Malignant neoplasm of upper-outer quadrant of right female breast: Secondary | ICD-10-CM | POA: Diagnosis not present

## 2018-05-12 DIAGNOSIS — Z7189 Other specified counseling: Secondary | ICD-10-CM

## 2018-05-12 DIAGNOSIS — C50919 Malignant neoplasm of unspecified site of unspecified female breast: Secondary | ICD-10-CM

## 2018-05-12 DIAGNOSIS — R740 Nonspecific elevation of levels of transaminase and lactic acid dehydrogenase [LDH]: Secondary | ICD-10-CM | POA: Diagnosis not present

## 2018-05-12 DIAGNOSIS — E876 Hypokalemia: Secondary | ICD-10-CM | POA: Diagnosis not present

## 2018-05-12 DIAGNOSIS — Z5111 Encounter for antineoplastic chemotherapy: Secondary | ICD-10-CM | POA: Diagnosis not present

## 2018-05-12 DIAGNOSIS — Z95828 Presence of other vascular implants and grafts: Secondary | ICD-10-CM

## 2018-05-12 DIAGNOSIS — Z170 Estrogen receptor positive status [ER+]: Secondary | ICD-10-CM | POA: Diagnosis not present

## 2018-05-12 DIAGNOSIS — N3000 Acute cystitis without hematuria: Secondary | ICD-10-CM

## 2018-05-12 DIAGNOSIS — D649 Anemia, unspecified: Secondary | ICD-10-CM | POA: Diagnosis not present

## 2018-05-12 DIAGNOSIS — R300 Dysuria: Secondary | ICD-10-CM | POA: Diagnosis not present

## 2018-05-12 DIAGNOSIS — C787 Secondary malignant neoplasm of liver and intrahepatic bile duct: Secondary | ICD-10-CM | POA: Diagnosis not present

## 2018-05-12 DIAGNOSIS — R197 Diarrhea, unspecified: Secondary | ICD-10-CM | POA: Diagnosis not present

## 2018-05-12 LAB — CMP (CANCER CENTER ONLY)
ALT: 36 U/L (ref 0–44)
AST: 47 U/L — ABNORMAL HIGH (ref 15–41)
Albumin: 4 g/dL (ref 3.5–5.0)
Alkaline Phosphatase: 149 U/L — ABNORMAL HIGH (ref 38–126)
Anion gap: 12 (ref 5–15)
BUN: 10 mg/dL (ref 8–23)
CO2: 22 mmol/L (ref 22–32)
Calcium: 9.6 mg/dL (ref 8.9–10.3)
Chloride: 103 mmol/L (ref 98–111)
Creatinine: 1.26 mg/dL — ABNORMAL HIGH (ref 0.44–1.00)
GFR, Est AFR Am: 52 mL/min — ABNORMAL LOW (ref >60)
GFR, Estimated: 45 mL/min — ABNORMAL LOW (ref >60)
Glucose, Bld: 102 mg/dL — ABNORMAL HIGH (ref 70–99)
Potassium: 2.7 mmol/L — CL (ref 3.5–5.1)
Sodium: 137 mmol/L (ref 135–145)
Total Bilirubin: 0.5 mg/dL (ref 0.3–1.2)
Total Protein: 7.2 g/dL (ref 6.5–8.1)

## 2018-05-12 LAB — CBC WITH DIFFERENTIAL (CANCER CENTER ONLY)
Abs Immature Granulocytes: 0.01 10*3/uL (ref 0.00–0.07)
Basophils Absolute: 0 10*3/uL (ref 0.0–0.1)
Basophils Relative: 1 %
Eosinophils Absolute: 0 10*3/uL (ref 0.0–0.5)
Eosinophils Relative: 1 %
HCT: 31.4 % — ABNORMAL LOW (ref 36.0–46.0)
Hemoglobin: 10.3 g/dL — ABNORMAL LOW (ref 12.0–15.0)
Immature Granulocytes: 0 %
Lymphocytes Relative: 47 %
Lymphs Abs: 1.6 10*3/uL (ref 0.7–4.0)
MCH: 27.9 pg (ref 26.0–34.0)
MCHC: 32.8 g/dL (ref 30.0–36.0)
MCV: 85.1 fL (ref 80.0–100.0)
Monocytes Absolute: 0.2 10*3/uL (ref 0.1–1.0)
Monocytes Relative: 5 %
Neutro Abs: 1.6 10*3/uL — ABNORMAL LOW (ref 1.7–7.7)
Neutrophils Relative %: 46 %
Platelet Count: 326 10*3/uL (ref 150–400)
RBC: 3.69 MIL/uL — ABNORMAL LOW (ref 3.87–5.11)
RDW: 14.5 % (ref 11.5–15.5)
WBC Count: 3.4 10*3/uL — ABNORMAL LOW (ref 4.0–10.5)
nRBC: 0 % (ref 0.0–0.2)

## 2018-05-12 MED ORDER — ONDANSETRON HCL 8 MG PO TABS
ORAL_TABLET | ORAL | Status: AC
  Filled 2018-05-12: qty 1

## 2018-05-12 MED ORDER — SODIUM CHLORIDE 0.9 % IV SOLN
Freq: Once | INTRAVENOUS | Status: AC
  Administered 2018-05-12: 11:00:00 via INTRAVENOUS
  Filled 2018-05-12: qty 250

## 2018-05-12 MED ORDER — POTASSIUM CHLORIDE 10 MEQ/100ML IV SOLN
10.0000 meq | INTRAVENOUS | Status: DC
  Filled 2018-05-12: qty 100

## 2018-05-12 MED ORDER — POTASSIUM CHLORIDE 20 MEQ/15ML (10%) PO SOLN: 10.0000 meq | Freq: Once | ORAL | Status: DC

## 2018-05-12 MED ORDER — HEPARIN SOD (PORK) LOCK FLUSH 100 UNIT/ML IV SOLN
500.0000 [IU] | Freq: Once | INTRAVENOUS | Status: AC | PRN
  Administered 2018-05-12: 500 [IU]
  Filled 2018-05-12: qty 5

## 2018-05-12 MED ORDER — ONDANSETRON HCL 8 MG PO TABS
8.0000 mg | ORAL_TABLET | Freq: Once | ORAL | Status: AC
  Administered 2018-05-12: 8 mg via ORAL

## 2018-05-12 MED ORDER — POTASSIUM CHLORIDE 20 MEQ/15ML (10%) PO SOLN
20.0000 meq | Freq: Once | ORAL | Status: DC
  Filled 2018-05-12: qty 15

## 2018-05-12 MED ORDER — PACLITAXEL PROTEIN-BOUND CHEMO INJECTION 100 MG
80.0000 mg/m2 | Freq: Once | INTRAVENOUS | Status: AC
  Administered 2018-05-12: 125 mg via INTRAVENOUS
  Filled 2018-05-12: qty 25

## 2018-05-12 MED ORDER — SODIUM CHLORIDE 0.9% FLUSH
10.0000 mL | INTRAVENOUS | Status: DC | PRN
  Administered 2018-05-12: 10 mL
  Filled 2018-05-12: qty 10

## 2018-05-12 MED ORDER — PROCHLORPERAZINE MALEATE 10 MG PO TABS
ORAL_TABLET | ORAL | Status: AC
  Filled 2018-05-12: qty 1

## 2018-05-12 MED ORDER — POTASSIUM CHLORIDE 20 MEQ/15ML (10%) PO SOLN
20.0000 meq | Freq: Once | ORAL | Status: AC
  Administered 2018-05-12: 20 meq via ORAL
  Filled 2018-05-12: qty 15

## 2018-05-12 MED ORDER — PROCHLORPERAZINE MALEATE 10 MG PO TABS
10.0000 mg | ORAL_TABLET | Freq: Once | ORAL | Status: AC
  Administered 2018-05-12: 10 mg via ORAL

## 2018-05-12 NOTE — Patient Instructions (Signed)
[facility] Discharge Instructions for Patients Receiving Chemotherapy  Today you received the following chemotherapy agents:  Abraxane.  To help prevent nausea and vomiting after your treatment, we encourage you to take your nausea medication as directed.   If you develop nausea and vomiting that is not controlled by your nausea medication, call the clinic.   BELOW ARE SYMPTOMS THAT SHOULD BE REPORTED IMMEDIATELY:  *FEVER GREATER THAN 100.5 F  *CHILLS WITH OR WITHOUT FEVER  NAUSEA AND VOMITING THAT IS NOT CONTROLLED WITH YOUR NAUSEA MEDICATION  *UNUSUAL SHORTNESS OF BREATH  *UNUSUAL BRUISING OR BLEEDING  TENDERNESS IN MOUTH AND THROAT WITH OR WITHOUT PRESENCE OF ULCERS  *URINARY PROBLEMS  *BOWEL PROBLEMS  UNUSUAL RASH Items with * indicate a potential emergency and should be followed up as soon as possible.  Feel free to call the clinic should you have any questions or concerns. The clinic phone number is [phone].  Please show the CHEMO ALERT CARD at check-in to the Emergency Department and triage nurse.   

## 2018-05-13 LAB — CANCER ANTIGEN 27.29: CA 27.29: 3402.1 U/mL — ABNORMAL HIGH (ref 0.0–38.6)

## 2018-05-17 ENCOUNTER — Encounter: Payer: Self-pay | Admitting: Gastroenterology

## 2018-05-19 ENCOUNTER — Inpatient Hospital Stay: Payer: 59

## 2018-05-19 ENCOUNTER — Encounter: Payer: Self-pay | Admitting: Emergency Medicine

## 2018-05-19 ENCOUNTER — Inpatient Hospital Stay: Payer: 59 | Attending: Hematology

## 2018-05-19 VITALS — BP 122/84 | HR 68 | Temp 98.4°F | Resp 17 | Wt 133.0 lb

## 2018-05-19 DIAGNOSIS — C50411 Malignant neoplasm of upper-outer quadrant of right female breast: Secondary | ICD-10-CM | POA: Diagnosis not present

## 2018-05-19 DIAGNOSIS — D6481 Anemia due to antineoplastic chemotherapy: Secondary | ICD-10-CM | POA: Insufficient documentation

## 2018-05-19 DIAGNOSIS — R7989 Other specified abnormal findings of blood chemistry: Secondary | ICD-10-CM | POA: Insufficient documentation

## 2018-05-19 DIAGNOSIS — K219 Gastro-esophageal reflux disease without esophagitis: Secondary | ICD-10-CM | POA: Insufficient documentation

## 2018-05-19 DIAGNOSIS — Z5111 Encounter for antineoplastic chemotherapy: Secondary | ICD-10-CM | POA: Diagnosis not present

## 2018-05-19 DIAGNOSIS — R634 Abnormal weight loss: Secondary | ICD-10-CM | POA: Insufficient documentation

## 2018-05-19 DIAGNOSIS — R630 Anorexia: Secondary | ICD-10-CM | POA: Insufficient documentation

## 2018-05-19 DIAGNOSIS — R109 Unspecified abdominal pain: Secondary | ICD-10-CM | POA: Diagnosis not present

## 2018-05-19 DIAGNOSIS — R5383 Other fatigue: Secondary | ICD-10-CM | POA: Diagnosis not present

## 2018-05-19 DIAGNOSIS — I10 Essential (primary) hypertension: Secondary | ICD-10-CM | POA: Insufficient documentation

## 2018-05-19 DIAGNOSIS — R400 Somnolence: Secondary | ICD-10-CM | POA: Diagnosis not present

## 2018-05-19 DIAGNOSIS — E876 Hypokalemia: Secondary | ICD-10-CM | POA: Insufficient documentation

## 2018-05-19 DIAGNOSIS — Z95828 Presence of other vascular implants and grafts: Secondary | ICD-10-CM

## 2018-05-19 DIAGNOSIS — Z79899 Other long term (current) drug therapy: Secondary | ICD-10-CM | POA: Insufficient documentation

## 2018-05-19 DIAGNOSIS — K5900 Constipation, unspecified: Secondary | ICD-10-CM | POA: Diagnosis not present

## 2018-05-19 DIAGNOSIS — Z803 Family history of malignant neoplasm of breast: Secondary | ICD-10-CM | POA: Insufficient documentation

## 2018-05-19 DIAGNOSIS — M545 Low back pain: Secondary | ICD-10-CM | POA: Insufficient documentation

## 2018-05-19 DIAGNOSIS — R740 Nonspecific elevation of levels of transaminase and lactic acid dehydrogenase [LDH]: Secondary | ICD-10-CM | POA: Insufficient documentation

## 2018-05-19 DIAGNOSIS — Z170 Estrogen receptor positive status [ER+]: Secondary | ICD-10-CM | POA: Diagnosis not present

## 2018-05-19 DIAGNOSIS — C787 Secondary malignant neoplasm of liver and intrahepatic bile duct: Secondary | ICD-10-CM | POA: Insufficient documentation

## 2018-05-19 DIAGNOSIS — R197 Diarrhea, unspecified: Secondary | ICD-10-CM | POA: Insufficient documentation

## 2018-05-19 DIAGNOSIS — Z5112 Encounter for antineoplastic immunotherapy: Secondary | ICD-10-CM | POA: Diagnosis present

## 2018-05-19 DIAGNOSIS — C50919 Malignant neoplasm of unspecified site of unspecified female breast: Secondary | ICD-10-CM

## 2018-05-19 DIAGNOSIS — Z7189 Other specified counseling: Secondary | ICD-10-CM

## 2018-05-19 DIAGNOSIS — N3000 Acute cystitis without hematuria: Secondary | ICD-10-CM

## 2018-05-19 LAB — CBC WITH DIFFERENTIAL (CANCER CENTER ONLY)
Abs Immature Granulocytes: 0.01 10*3/uL (ref 0.00–0.07)
Basophils Absolute: 0.1 10*3/uL (ref 0.0–0.1)
Basophils Relative: 1 %
Eosinophils Absolute: 0 10*3/uL (ref 0.0–0.5)
Eosinophils Relative: 1 %
HCT: 28.3 % — ABNORMAL LOW (ref 36.0–46.0)
Hemoglobin: 9.2 g/dL — ABNORMAL LOW (ref 12.0–15.0)
Immature Granulocytes: 0 %
Lymphocytes Relative: 53 %
Lymphs Abs: 1.8 10*3/uL (ref 0.7–4.0)
MCH: 27.9 pg (ref 26.0–34.0)
MCHC: 32.5 g/dL (ref 30.0–36.0)
MCV: 85.8 fL (ref 80.0–100.0)
Monocytes Absolute: 0.2 10*3/uL (ref 0.1–1.0)
Monocytes Relative: 5 %
Neutro Abs: 1.4 10*3/uL — ABNORMAL LOW (ref 1.7–7.7)
Neutrophils Relative %: 40 %
Platelet Count: 304 10*3/uL (ref 150–400)
RBC: 3.3 MIL/uL — ABNORMAL LOW (ref 3.87–5.11)
RDW: 15.5 % (ref 11.5–15.5)
WBC Count: 3.5 10*3/uL — ABNORMAL LOW (ref 4.0–10.5)
nRBC: 0 % (ref 0.0–0.2)

## 2018-05-19 LAB — CMP (CANCER CENTER ONLY)
ALT: 23 U/L (ref 0–44)
AST: 35 U/L (ref 15–41)
Albumin: 3.7 g/dL (ref 3.5–5.0)
Alkaline Phosphatase: 109 U/L (ref 38–126)
Anion gap: 9 (ref 5–15)
BUN: 7 mg/dL — ABNORMAL LOW (ref 8–23)
CO2: 20 mmol/L — ABNORMAL LOW (ref 22–32)
Calcium: 9.5 mg/dL (ref 8.9–10.3)
Chloride: 107 mmol/L (ref 98–111)
Creatinine: 0.89 mg/dL (ref 0.44–1.00)
GFR, Est AFR Am: >60 mL/min (ref >60)
GFR, Estimated: >60 mL/min (ref >60)
Glucose, Bld: 89 mg/dL (ref 70–99)
Potassium: 4 mmol/L (ref 3.5–5.1)
Sodium: 136 mmol/L (ref 135–145)
Total Bilirubin: 0.5 mg/dL (ref 0.3–1.2)
Total Protein: 6.7 g/dL (ref 6.5–8.1)

## 2018-05-19 LAB — URINALYSIS, COMPLETE (UACMP) WITH MICROSCOPIC
Bilirubin Urine: NEGATIVE
Glucose, UA: NEGATIVE mg/dL
Ketones, ur: NEGATIVE mg/dL
Leukocytes, UA: NEGATIVE
Nitrite: NEGATIVE
Protein, ur: NEGATIVE mg/dL
Specific Gravity, Urine: 1.011 (ref 1.005–1.030)
pH: 5 (ref 5.0–8.0)

## 2018-05-19 MED ORDER — PROCHLORPERAZINE MALEATE 10 MG PO TABS
ORAL_TABLET | ORAL | Status: AC
  Filled 2018-05-19: qty 1

## 2018-05-19 MED ORDER — DIPHENHYDRAMINE HCL 50 MG/ML IJ SOLN
50.0000 mg | Freq: Once | INTRAMUSCULAR | Status: AC
  Administered 2018-05-19: 50 mg via INTRAVENOUS

## 2018-05-19 MED ORDER — SODIUM CHLORIDE 0.9% FLUSH
10.0000 mL | INTRAVENOUS | Status: DC | PRN
  Administered 2018-05-19: 10 mL
  Filled 2018-05-19: qty 10

## 2018-05-19 MED ORDER — PROCHLORPERAZINE MALEATE 10 MG PO TABS
10.0000 mg | ORAL_TABLET | Freq: Once | ORAL | Status: AC
  Administered 2018-05-19: 10 mg via ORAL

## 2018-05-19 MED ORDER — SODIUM CHLORIDE 0.9 % IV SOLN
420.0000 mg | Freq: Once | INTRAVENOUS | Status: AC
  Administered 2018-05-19: 420 mg via INTRAVENOUS
  Filled 2018-05-19: qty 14

## 2018-05-19 MED ORDER — ACETAMINOPHEN 325 MG PO TABS
ORAL_TABLET | ORAL | Status: AC
  Filled 2018-05-19: qty 2

## 2018-05-19 MED ORDER — ONDANSETRON HCL 8 MG PO TABS: 8.0000 mg | ORAL_TABLET | Freq: Once | ORAL | Status: DC

## 2018-05-19 MED ORDER — ACETAMINOPHEN 325 MG PO TABS
650.0000 mg | ORAL_TABLET | Freq: Once | ORAL | Status: AC
  Administered 2018-05-19: 650 mg via ORAL

## 2018-05-19 MED ORDER — TRASTUZUMAB CHEMO 150 MG IV SOLR
357.0000 mg | Freq: Once | INTRAVENOUS | Status: AC
  Administered 2018-05-19: 357 mg via INTRAVENOUS
  Filled 2018-05-19: qty 17

## 2018-05-19 MED ORDER — HEPARIN SOD (PORK) LOCK FLUSH 100 UNIT/ML IV SOLN
500.0000 [IU] | Freq: Once | INTRAVENOUS | Status: AC | PRN
  Administered 2018-05-19: 500 [IU]
  Filled 2018-05-19: qty 5

## 2018-05-19 MED ORDER — SODIUM CHLORIDE 0.9 % IV SOLN
Freq: Once | INTRAVENOUS | Status: AC
  Administered 2018-05-19: 10:00:00 via INTRAVENOUS
  Filled 2018-05-19: qty 250

## 2018-05-19 MED ORDER — DIPHENHYDRAMINE HCL 50 MG/ML IJ SOLN
INTRAMUSCULAR | Status: AC
  Filled 2018-05-19: qty 1

## 2018-05-19 MED ORDER — PACLITAXEL PROTEIN-BOUND CHEMO INJECTION 100 MG
80.0000 mg/m2 | Freq: Once | INTRAVENOUS | Status: AC
  Administered 2018-05-19: 125 mg via INTRAVENOUS
  Filled 2018-05-19: qty 25

## 2018-05-19 NOTE — Patient Instructions (Signed)

## 2018-05-19 NOTE — Patient Instructions (Addendum)
[facility] Discharge Instructions for Patients Receiving Chemotherapy  Today you received the following chemotherapy agents:  Abraxane, Herceptin, Perjeta.  To help prevent nausea and vomiting after your treatment, we encourage you to take your nausea medication as directed.   If you develop nausea and vomiting that is not controlled by your nausea medication, call the clinic.   BELOW ARE SYMPTOMS THAT SHOULD BE REPORTED IMMEDIATELY:  *FEVER GREATER THAN 100.5 F  *CHILLS WITH OR WITHOUT FEVER  NAUSEA AND VOMITING THAT IS NOT CONTROLLED WITH YOUR NAUSEA MEDICATION  *UNUSUAL SHORTNESS OF BREATH  *UNUSUAL BRUISING OR BLEEDING  TENDERNESS IN MOUTH AND THROAT WITH OR WITHOUT PRESENCE OF ULCERS  *URINARY PROBLEMS  *BOWEL PROBLEMS  UNUSUAL RASH Items with * indicate a potential emergency and should be followed up as soon as possible.  Feel free to call the clinic should you have any questions or concerns. The clinic phone number is [phone].  Please show the [place] at check-in to the Emergency Department and triage nurse.   Leukopenia Leukopenia is a condition in which you have a low number of white blood cells. White blood cells help the body to fight infections. The number of white blood cells in the body varies from person to person. There are five types of white blood cells. Two types (lymphocytes and neutrophils) make up most of the white blood cell count. When lymphocytes are low, the condition is called lymphocytopenia. When neutrophils are low, it is called neutropenia. Neutropenia is the most dangerous type of leukopenia because it can lead to dangerous infections. What are the causes? This condition is commonly caused by damage to soft tissue inside of the bones (bone marrow), which is where most white blood cells are made. Bone marrow can get damaged by:  Medicine or X-ray treatments for cancer (chemotherapy or radiation therapy).  Serious  infections.  Cancer of the white blood cells (leukemia, lymphoma, or myeloma).  Medicines, including: ? Certain antibiotics. ? Certain heart medicines. ? Steroids. ? Certain medicines used to treat diseases of the immune system (autoimmune diseases), like rheumatoid arthritis.  Leukopenia also happens when white blood cells are destroyed after leaving the bone marrow, which may result from:  Liver disease.  Autoimmune disease.  Vitamin B deficiencies.  What are the signs or symptoms? One of the most common signs of leukopenia, especially severe neutropenia, is having a lot of bacterial infections. Different infections have different symptoms. An infection in your lungs may cause coughing. A urinary tract infection may cause frequent urination and a burning sensation. You may also get infections of the blood, skin, rectum, throat, sinuses, or ears. Some people have no symptoms. If you do have symptoms, they may include:  Fever.  Fatigue.  Swollen glands (lymph nodes).  Painful mouth ulcers.  Gum disease.  How is this diagnosed? This condition may be diagnosed based on:  Your medical history.  A physical exam to check for swollen lymph nodes and an enlarged spleen. Your spleen is an organ on the left side of your body that stores white blood cells.  Tests, such as: ? A complete blood count. This blood test counts each type of white cell. ? Bone marrow aspiration. Some bone marrow is removed to be checked under a microscope. ? Lymph node biopsy. Some lymph node tissue is removed to be checked under a microscope. ? Other types of blood tests or imaging tests.  How is this treated? Treatment of leukopenia depends on  the cause. Some common treatments include:  Antibiotic medicine to treat bacterial infections.  Stopping medicines that may cause leukopenia.  Medicines to stimulate neutrophil production (hematopoietic growth factors), to treat neutropenia.  Follow these  instructions at home:  Take over-the-counter and prescription medicines only as told by your health care provider. This includes supplements and vitamins.  If you were prescribed an antibiotic medicine, take it as told by your health care provider. Do not stop taking the antibiotic even if you start to feel better.  Preventing infection is important if you have leukopenia. To prevent infection: ? Avoid close contact with sick people. ? Wash your hands frequently with soap and water. If soap and water are not available, use hand sanitizer. ? Do not&nbsp;eat uncooked or undercooked meats. ? Wash fruits and vegetables before eating them. ? Do not eat or drink unpasteurized dairy products. ? Get regular dental care, and maintain good dental hygiene. You should visit the dentist at least once every 6 months.  Keep all follow-up visits as told by your health care provider. This is important. Contact a health care provider if:  You have chills or a fever.  You have symptoms of an infection. Get help right away if:  You have a fever that lasts for more than 2-3 days.  You have symptoms that last for more than 2-3 days.  You have trouble breathing.  You have chest pain. This information is not intended to replace advice given to you by your health care provider. Make sure you discuss any questions you have with your health care provider. Document Released: 07/10/2013 Document Revised: 05/25/2016 Document Reviewed: 05/25/2016 Elsevier Interactive Patient Education  [employer].

## 2018-05-19 NOTE — Progress Notes (Signed)
Ok to treat with ANC of 1.4 per Cira Rue, NP .

## 2018-05-20 ENCOUNTER — Telehealth: Payer: Self-pay | Admitting: Hematology

## 2018-05-20 LAB — URINE CULTURE: Culture: <10000 — AB

## 2018-05-20 NOTE — Telephone Encounter (Signed)
Pt had some dysuria last week when I saw her, UA and urine culture were ordered but she did not do it until yesterday when she came in for chemo. UA showed (+) WBC. Culture is still pending. I called pt this morning. She has minimum dysuria now, no fever. I encourage her continue drinking fluids adequately, and will wait for culture results. She does not want to take antibiotics for now. She knows to call us if she develops worsening symptoms or fever.   [name]  05/20/2018

## 2018-05-22 ENCOUNTER — Telehealth: Payer: Self-pay | Admitting: Hematology

## 2018-05-22 ENCOUNTER — Telehealth: Payer: Self-pay

## 2018-05-22 NOTE — Telephone Encounter (Signed)
R/s appt per 1/14 sch message - pt is aware of appt date and time  

## 2018-05-22 NOTE — Telephone Encounter (Signed)
Patient called stating she had not heard anything from scheduling about CT scan, per Dr. Ernestina Penna note informed her from 10/25 note she indicated she wants it within the next 5 to 6 weeks, will follow up on this.  Also she could not come late in the day on 12/6, sent high priority schedule message to please move this patient's appointments up earlier in the day.

## 2018-05-26 ENCOUNTER — Inpatient Hospital Stay: Payer: 59 | Admitting: Nutrition

## 2018-05-26 ENCOUNTER — Inpatient Hospital Stay (HOSPITAL_BASED_OUTPATIENT_CLINIC_OR_DEPARTMENT_OTHER): Payer: 59 | Admitting: Hematology

## 2018-05-26 ENCOUNTER — Encounter: Payer: Self-pay | Admitting: Hematology

## 2018-05-26 ENCOUNTER — Inpatient Hospital Stay: Payer: 59

## 2018-05-26 ENCOUNTER — Telehealth: Payer: Self-pay | Admitting: Hematology

## 2018-05-26 VITALS — BP 150/87 | HR 70 | Temp 98.2°F | Resp 18 | Ht 64.0 in | Wt 133.6 lb

## 2018-05-26 DIAGNOSIS — R400 Somnolence: Secondary | ICD-10-CM

## 2018-05-26 DIAGNOSIS — R197 Diarrhea, unspecified: Secondary | ICD-10-CM | POA: Diagnosis not present

## 2018-05-26 DIAGNOSIS — M545 Low back pain: Secondary | ICD-10-CM

## 2018-05-26 DIAGNOSIS — Z5111 Encounter for antineoplastic chemotherapy: Secondary | ICD-10-CM | POA: Diagnosis not present

## 2018-05-26 DIAGNOSIS — R634 Abnormal weight loss: Secondary | ICD-10-CM

## 2018-05-26 DIAGNOSIS — R7989 Other specified abnormal findings of blood chemistry: Secondary | ICD-10-CM

## 2018-05-26 DIAGNOSIS — Z7189 Other specified counseling: Secondary | ICD-10-CM

## 2018-05-26 DIAGNOSIS — R109 Unspecified abdominal pain: Secondary | ICD-10-CM | POA: Diagnosis not present

## 2018-05-26 DIAGNOSIS — I10 Essential (primary) hypertension: Secondary | ICD-10-CM

## 2018-05-26 DIAGNOSIS — R5383 Other fatigue: Secondary | ICD-10-CM

## 2018-05-26 DIAGNOSIS — Z79899 Other long term (current) drug therapy: Secondary | ICD-10-CM

## 2018-05-26 DIAGNOSIS — C787 Secondary malignant neoplasm of liver and intrahepatic bile duct: Secondary | ICD-10-CM | POA: Diagnosis not present

## 2018-05-26 DIAGNOSIS — C50919 Malignant neoplasm of unspecified site of unspecified female breast: Secondary | ICD-10-CM

## 2018-05-26 DIAGNOSIS — C170 Malignant neoplasm of duodenum: Secondary | ICD-10-CM | POA: Diagnosis not present

## 2018-05-26 DIAGNOSIS — K219 Gastro-esophageal reflux disease without esophagitis: Secondary | ICD-10-CM

## 2018-05-26 DIAGNOSIS — K5900 Constipation, unspecified: Secondary | ICD-10-CM

## 2018-05-26 DIAGNOSIS — R740 Nonspecific elevation of levels of transaminase and lactic acid dehydrogenase [LDH]: Secondary | ICD-10-CM

## 2018-05-26 DIAGNOSIS — D6481 Anemia due to antineoplastic chemotherapy: Secondary | ICD-10-CM

## 2018-05-26 DIAGNOSIS — Z803 Family history of malignant neoplasm of breast: Secondary | ICD-10-CM

## 2018-05-26 DIAGNOSIS — E876 Hypokalemia: Secondary | ICD-10-CM | POA: Diagnosis not present

## 2018-05-26 DIAGNOSIS — C50411 Malignant neoplasm of upper-outer quadrant of right female breast: Secondary | ICD-10-CM

## 2018-05-26 DIAGNOSIS — R630 Anorexia: Secondary | ICD-10-CM

## 2018-05-26 DIAGNOSIS — Z170 Estrogen receptor positive status [ER+]: Secondary | ICD-10-CM | POA: Diagnosis not present

## 2018-05-26 LAB — CBC WITH DIFFERENTIAL (CANCER CENTER ONLY)
Abs Immature Granulocytes: 0.01 10*3/uL (ref 0.00–0.07)
Basophils Absolute: 0.1 10*3/uL (ref 0.0–0.1)
Basophils Relative: 2 %
Eosinophils Absolute: 0 10*3/uL (ref 0.0–0.5)
Eosinophils Relative: 1 %
HCT: 28.6 % — ABNORMAL LOW (ref 36.0–46.0)
Hemoglobin: 9.4 g/dL — ABNORMAL LOW (ref 12.0–15.0)
Immature Granulocytes: 0 %
Lymphocytes Relative: 41 %
Lymphs Abs: 1.5 10*3/uL (ref 0.7–4.0)
MCH: 28.1 pg (ref 26.0–34.0)
MCHC: 32.9 g/dL (ref 30.0–36.0)
MCV: 85.6 fL (ref 80.0–100.0)
Monocytes Absolute: 0.3 10*3/uL (ref 0.1–1.0)
Monocytes Relative: 7 %
Neutro Abs: 1.9 10*3/uL (ref 1.7–7.7)
Neutrophils Relative %: 49 %
Platelet Count: 311 10*3/uL (ref 150–400)
RBC: 3.34 MIL/uL — ABNORMAL LOW (ref 3.87–5.11)
RDW: 16.8 % — ABNORMAL HIGH (ref 11.5–15.5)
WBC Count: 3.7 10*3/uL — ABNORMAL LOW (ref 4.0–10.5)
nRBC: 0.5 % — ABNORMAL HIGH (ref 0.0–0.2)

## 2018-05-26 LAB — CMP (CANCER CENTER ONLY)
ALT: 21 U/L (ref 0–44)
AST: 36 U/L (ref 15–41)
Albumin: 3.8 g/dL (ref 3.5–5.0)
Alkaline Phosphatase: 103 U/L (ref 38–126)
Anion gap: 8 (ref 5–15)
BUN: 7 mg/dL — ABNORMAL LOW (ref 8–23)
CO2: 22 mmol/L (ref 22–32)
Calcium: 9.3 mg/dL (ref 8.9–10.3)
Chloride: 108 mmol/L (ref 98–111)
Creatinine: 0.9 mg/dL (ref 0.44–1.00)
GFR, Est AFR Am: >60 mL/min (ref >60)
GFR, Estimated: >60 mL/min (ref >60)
Glucose, Bld: 89 mg/dL (ref 70–99)
Potassium: 3.6 mmol/L (ref 3.5–5.1)
Sodium: 138 mmol/L (ref 135–145)
Total Bilirubin: 0.4 mg/dL (ref 0.3–1.2)
Total Protein: 6.7 g/dL (ref 6.5–8.1)

## 2018-05-26 MED ORDER — ONDANSETRON HCL 8 MG PO TABS: 8.0000 mg | ORAL_TABLET | Freq: Once | ORAL | Status: DC

## 2018-05-26 MED ORDER — PACLITAXEL PROTEIN-BOUND CHEMO INJECTION 100 MG
80.0000 mg/m2 | Freq: Once | INTRAVENOUS | Status: AC
  Administered 2018-05-26: 125 mg via INTRAVENOUS
  Filled 2018-05-26: qty 25

## 2018-05-26 MED ORDER — PROCHLORPERAZINE MALEATE 10 MG PO TABS
10.0000 mg | ORAL_TABLET | Freq: Once | ORAL | Status: AC
  Administered 2018-05-26: 10 mg via ORAL

## 2018-05-26 MED ORDER — HEPARIN SOD (PORK) LOCK FLUSH 100 UNIT/ML IV SOLN
500.0000 [IU] | Freq: Once | INTRAVENOUS | Status: AC | PRN
  Administered 2018-05-26: 500 [IU]
  Filled 2018-05-26: qty 5

## 2018-05-26 MED ORDER — TRAMADOL HCL 50 MG PO TABS: 50.0000 mg | ORAL_TABLET | Freq: Four times a day (QID) | ORAL | 0 refills | Status: DC | PRN

## 2018-05-26 MED ORDER — SODIUM CHLORIDE 0.9 % IV SOLN
Freq: Once | INTRAVENOUS | Status: AC
  Administered 2018-05-26: 10:00:00 via INTRAVENOUS
  Filled 2018-05-26: qty 250

## 2018-05-26 MED ORDER — SODIUM CHLORIDE 0.9% FLUSH
10.0000 mL | INTRAVENOUS | Status: DC | PRN
  Administered 2018-05-26: 10 mL
  Filled 2018-05-26: qty 10

## 2018-05-26 MED ORDER — PROCHLORPERAZINE MALEATE 10 MG PO TABS
ORAL_TABLET | ORAL | Status: AC
  Filled 2018-05-26: qty 1

## 2018-05-26 NOTE — Progress Notes (Signed)
Brief nutrition follow-up completed with patient during infusion. Weight increased and documented as 133.6 pounds November 8. Patient reports she feels much better. States her oral intake and appetite have improved. She denies nutrition impact symptoms. She is no longer drinking oral nutrition supplements because she is eating well.  Nutrition diagnosis: Unintended weight loss resolved.  Provided support and encouragement for patient to continue strategies for weight maintenance/weight goal.  Patient has my contact information for questions or concerns.  **Disclaimer: This note was dictated with voice recognition software. Similar sounding words can inadvertently be transcribed and this note may contain transcription errors which may not have been corrected upon publication of note.**

## 2018-05-26 NOTE — Telephone Encounter (Signed)
No los 11/8

## 2018-05-26 NOTE — Patient Instructions (Signed)
[facility] Discharge Instructions for Patients Receiving Chemotherapy  Today you received the following chemotherapy agents:  Abraxane.  To help prevent nausea and vomiting after your treatment, we encourage you to take your nausea medication as directed.   If you develop nausea and vomiting that is not controlled by your nausea medication, call the clinic.   BELOW ARE SYMPTOMS THAT SHOULD BE REPORTED IMMEDIATELY:  *FEVER GREATER THAN 100.5 F  *CHILLS WITH OR WITHOUT FEVER  NAUSEA AND VOMITING THAT IS NOT CONTROLLED WITH YOUR NAUSEA MEDICATION  *UNUSUAL SHORTNESS OF BREATH  *UNUSUAL BRUISING OR BLEEDING  TENDERNESS IN MOUTH AND THROAT WITH OR WITHOUT PRESENCE OF ULCERS  *URINARY PROBLEMS  *BOWEL PROBLEMS  UNUSUAL RASH Items with * indicate a potential emergency and should be followed up as soon as possible.  Feel free to call the clinic should you have any questions or concerns. The clinic phone number is [phone].  Please show the CHEMO ALERT CARD at check-in to the Emergency Department and triage nurse.   

## 2018-05-26 NOTE — Progress Notes (Signed)
[place]  [phone] [phone]  Clinic Follow-up Note   Patient Care Team: Levin Bacon, NP as PCP - General (Family Medicine) [name], MD as Consulting Physician (Gastroenterology) [name], MD as Consulting Physician (Hematology)   Date of Service:  05/26/2018   CHIEF COMPLAINTS:  F/u metastatic breast cancer  Oncology History   Cancer Staging Metastatic breast cancer [facility]) Staging form: Breast, AJCC 8th Edition - Clinical stage from 03/24/2018: Stage IV (cT2, cN1, pM1, G3, ER+, PR+, HER2+) - Signed by [name], MD on 03/30/2018       Metastatic breast cancer ([place])   01/30/2018 Procedure    Colonoscopy showed small polyp in the sigmoid colon, removed, the exam of colon including the terminal ileum was otherwise negative.    01/30/2018 Procedure    EGD by Dr. Collene Mares showed small hiatal hernia, a 8 mm polypoid lesion in the cardia, biopsied.    03/09/2018 Imaging    03/09/2018 US Abdomen IMPRESSION: 1. Mass lesions throughout the liver, consistent with metastatic disease. Liver as a somewhat nodular contour suggesting underlying hepatic cirrhosis. Inhomogeneous echotexture to the liver.  2. Cholelithiasis with mild gallbladder wall thickening. A degree of cholecystitis cannot be excluded by ultrasound.  3. Portions of pancreas obscured by gas. Visualized portions of pancreas appear normal.  4. Small right kidney. Etiology uncertain. This finding potentially may be indicative of renal artery stenosis. In this regard, question whether patient is hypertensive.    03/15/2018 Imaging    CT CAP with contrast  IMPRESSION: 1. Widespread hepatic metastasis. 2. 2.6 cm lateral right breast soft tissue nodule could represent a breast primary or an incidental benign lesion. Consider correlation with mammogram and ultrasound. 3. No definite source of primary malignancy identified within the abdomen or pelvis. There is possible  rectosigmoid junction wall thickening. Consider colonoscopy with attention to this area. 4. Distal esophageal wall thickening, suggesting esophagitis.    03/24/2018 Cancer Staging    Staging form: Breast, AJCC 8th Edition - Clinical stage from 03/24/2018: Stage IV (cT2, cN1, pM1, G3, ER+, PR+, HER2+) - Signed by [name], MD on 03/30/2018    03/24/2018 Initial Biopsy    Diagnosis 1. 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 Breast, right, needle core biopsy, 9 o'clock, 7cm from nipple - INVASIVE DUCTAL CARCINOMA. -The carcinoma is somewhat morphologically dissimilar from that in part 1. It appears grade III 3. Lymph node, needle/core biopsy, right axillary - METASTATIC CARCINOMA IN 1 OF 1 LYMPH NODE (1/1).    03/24/2018 Receptors her2    Breast biopsy: 1. Estrogen Receptor: 40%, POSITIVE, STRONG-MODERATE STAINING INTENSITY Progesterone Receptor: 70%, POSITIVE, STRONG STAINING INTENSITY Proliferation Marker Ki67: 20% HER 2 equivocal by IHC 2+, POSITIVE by FISH, ratio 2.4 and copy #4.2  2. Estrogen Receptor: 60%, POSITIVE, MODERATE STAINING INTENSITY Progesterone Receptor: 40%, POSITIVE, MODERATE STAINING INTENSITY Proliferation Marker Ki67: 20% HER2 (+) by IHC 3+    03/24/2018 Initial Diagnosis    Metastatic breast cancer ([place])    03/27/2018 Pathology Results    Diagnosis Liver, needle/core biopsy, Right - METASTATIC CARCINOMA TO LIVER, CONSISTENT WITH PATIENTS CLINICAL HISTORY OF PRIMARY BREAST CARCINOMA.  ER 80%+ PR40%+ HER2- (by [facility], IHC 2+)  Ki67 50%     03/28/2018 Pathology Results    03/28/2018 Surgical Pathology Diagnosis 1. Breast, left, needle core biopsy, 9 o'clock - FIBROCYSTIC CHANGES. - THERE IS NO EVIDENCE OF MALIGNANCY.  2. Breast, left, needle core biopsy, 2 o'clock - FIBROADENOMA. - THERE IS NO EVIDENCE OF MALIGNANCY. - SEE COMMENT.    03/29/2018 Imaging    03/29/2018  Bone Scan IMPRESSION: No scintigraphic evidence of osseous metastatic disease.    03/30/2018 Imaging    Bone scan  IMPRESSION: No scintigraphic evidence of osseous metastatic disease.     04/07/2018 -  Chemotherapy    First line chemo weekly Taxol and herceptin/Perjeta every 3 weeks starting 04/07/18. She developed infusion reaction to taxol and it was discontinued. Added Abraxane on C1D8.       HISTORY OF PRESENTING ILLNESS:  Teresa Hays 62 y.o. female who is here with her family member. She works at [employer]. She went to see Dr. Collene Mares because she felt that there is something stuck in her throat with burning sensation in her abdomen. She then started vomiting bile. She reports losing weight and has no appetite. She finds it hard to eat as she feels nauseated after eating, she can still drink water. She can drinks smoothies and shakes. She takes omeprazole that helps. She also reports constipation and dry mouth. Her skin is also dry. She denies HA, CP, or skin rashes.   She also reports very bad lower back pain that doesn't allow her to sit on her couch after work. The pain is 7/10, and she doesn't use any pain medications.    G1P1 Age 56 with first child. 1 Child Age 22 at menarche   She has GERD and HTN. No alcohol or smoking. Her sister had breast cancer at age 33.   CURRENT THERAPY: First line chemo weekly Taxol and Herceptin/Perjeta every 3 weeks starting 04/07/18. She developed infusion reaction to taxol and it was discontinued. Changed to Abraxane on C1D8.   INTERVAL HISTORY  Teresa Hays is a 62 y.o. female who is here for follow-up and weekly Abraxane. She presents to the clinic today accompanied by her sister.  She notes she was able to gain 5 pounds and her appetite has improved. She notes her pain has improved and only takes Tramadol as needed. She notes she will occasionally get "the shakes" in her hands as if she is nervous. This is not related to chills or  neuropathy. She denies having confusion. We reviewed her medication list.  She notes she wants to continue with treatment and is not ready a for a chemo break.      MEDICAL HISTORY:  Past Medical History:  Diagnosis Date  . Cancer ([place])   . GERD (gastroesophageal reflux disease)   . Hypertension     SURGICAL HISTORY: Past Surgical History:  Procedure Laterality Date  . COLONOSCOPY    . ESOPHAGOGASTRODUODENOSCOPY ENDOSCOPY    . IR IMAGING GUIDED PORT INSERTION  04/04/2018    SOCIAL HISTORY: Social History   Socioeconomic History  . Marital status: Single    Spouse name: Not on file  . Number of children: Not on file  . Years of education: Not on file  . Highest education level: Not on file  Occupational History  . Not on file  Social Needs  . Financial resource strain: Not on file  . Food insecurity:    Worry: Not on file    Inability: Not on file  . Transportation needs:    Medical: Not on file    Non-medical: Not on file  Tobacco Use  . Smoking status: Never Smoker  . Smokeless tobacco: Never Used  Substance and Sexual Activity  .  Alcohol use: No  . Drug use: Never  . Sexual activity: Not on file  Lifestyle  . Physical activity:    Days per week: Not on file    Minutes per session: Not on file  . Stress: Not on file  Relationships  . Social connections:    Talks on phone: Not on file    Gets together: Not on file    Attends religious service: Not on file    Active member of club or organization: Not on file    Attends meetings of clubs or organizations: Not on file    Relationship status: Not on file  . Intimate partner violence:    Fear of current or ex partner: Not on file    Emotionally abused: Not on file    Physically abused: Not on file    Forced sexual activity: Not on file  Other Topics Concern  . Not on file  Social History Narrative  . Not on file    FAMILY HISTORY: Family History  Problem Relation Age of Onset  . Diabetes Mother     . Cancer Sister 39       breast cancer  . Rheum arthritis Sister     ALLERGIES:  has No Known Allergies.  MEDICATIONS:  Current Outpatient Medications  Medication Sig Dispense Refill  . amLODipine (NORVASC) 5 MG tablet Take 5 mg by mouth daily.     Marland Kitchen atenolol (TENORMIN) 50 MG tablet Take 50 mg by mouth every evening.     . dronabinol (MARINOL) 2.5 MG capsule Take 1 capsule twice daily before meals as needed (Patient taking differently: Take 2.5 mg by mouth 2 (two) times daily before lunch and supper. Take 1 capsule twice daily before meals as needed appetite) 30 capsule 1  . lidocaine-prilocaine (EMLA) cream Apply to affected area once 30 g 3  . LORazepam (ATIVAN) 0.5 MG tablet Take 1 tablet (0.5 mg total) by mouth at bedtime as needed for anxiety. 30 tablet 0  . mirtazapine (REMERON) 7.5 MG tablet Take 1 tablet (7.5 mg total) by mouth at bedtime. 30 tablet 0  . omeprazole (PRILOSEC) 20 MG capsule Take 1 capsule (20 mg total) by mouth daily. 30 capsule 1  . ondansetron (ZOFRAN) 8 MG tablet Take 1 tablet (8 mg total) by mouth 2 (two) times daily as needed (Nausea or vomiting). 30 tablet 1  . potassium chloride SA (K-DUR,KLOR-CON) 20 MEQ tablet Take 1 tablet (20 mEq total) by mouth 2 (two) times daily. Take 1 tablet by mouth 2 times daily for 3 days, then one tablet daily. 36 tablet 1  . traMADol (ULTRAM) 50 MG tablet Take 1 tablet (50 mg total) by mouth every 6 (six) hours as needed for moderate pain or severe pain. 30 tablet 0   No current facility-administered medications for this visit.     REVIEW OF SYSTEMS:   Constitutional: Denies fevers, chills or abnormal night sweats (+) weight gain Eyes: Denies blurriness of vision, double vision or watery eyes Ears, nose, mouth, throat, and face: Denies mucositis or sore throat Respiratory: Denies cough, dyspnea or wheezes Cardiovascular: Denies palpitation, chest discomfort or lower extremity swelling Gastrointestinal:  Denies heartburn or  change in bowel habits (+) RUQ pain much improved Skin: Denies abnormal skin rashes (+) dry skin, skin darkness around nose  Lymphatics: Denies new lymphadenopathy or easy bruising Neurological:Denies numbness, tingling or new weaknesses (+) occasional shaking of her hands Behavioral/Psych: Mood is stable, no new changes  All other  systems were reviewed with the patient and are negative.  PHYSICAL EXAMINATION:  ECOG PERFORMANCE STATUS: 2 - Symptomatic, <50% confined to bed  Vitals:   05/26/18 0928  BP: (!) 150/87  Pulse: 70  Resp: 18  Temp: 98.2 F (36.8 C)  SpO2: 100%   Filed Weights   05/26/18 0928  Weight: 133 lb 9.6 oz (60.6 kg)    GENERAL:alert, no distress and comfortable SKIN: skin color, texture, turgor are normal, no rashes or significant lesions EYES: normal, conjunctiva are pink and non-injected, sclera clear OROPHARYNX:no exudate, no erythema and lips, buccal mucosa, and tongue normal  NECK: supple, thyroid normal size, non-tender, without nodularity LYMPH:  no palpable lymphadenopathy in the cervical, axillary or inguinal LUNGS: clear to auscultation and percussion with normal breathing effort HEART: regular rate & rhythm and no murmurs and no lower extremity edema ABDOMEN:abdomen soft, non-tender and normal bowel sounds (+) liver edge palpable previously at 2-3 cm below costal margin, slightly smaller now (+) abdominal tenderness upon palpation Musculoskeletal:no cyanosis of digits and no clubbing  PSYCH: alert & oriented x 3 with fluent speech NEURO: no focal motor/sensory deficits BREAST: Breast exam deferred today.  LABORATORY DATA:  I have reviewed the data as listed CBC Latest Ref Rng & Units 05/26/2018 05/19/2018 05/12/2018  WBC 4.0 - 10.5 K/uL 3.7(L) 3.5(L) 3.4(L)  Hemoglobin 12.0 - 15.0 g/dL 9.4(L) 9.2(L) 10.3(L)  Hematocrit 36.0 - 46.0 % 28.6(L) 28.3(L) 31.4(L)  Platelets 150 - 400 K/uL 311 304 326    CMP Latest Ref Rng & Units 05/26/2018 05/19/2018  05/12/2018  Glucose 70 - 99 mg/dL 89 89 102(H)  BUN 8 - 23 mg/dL 7(L) 7(L) 10  Creatinine 0.44 - 1.00 mg/dL 0.90 0.89 1.26(H)  Sodium 135 - 145 mmol/L 138 136 137  Potassium 3.5 - 5.1 mmol/L 3.6 4.0 2.7(LL)  Chloride 98 - 111 mmol/L 108 107 103  CO2 22 - 32 mmol/L 22 20(L) 22  Calcium 8.9 - 10.3 mg/dL 9.3 9.5 9.6  Total Protein 6.5 - 8.1 g/dL 6.7 6.7 7.2  Total Bilirubin 0.3 - 1.2 mg/dL 0.4 0.5 0.5  Alkaline Phos 38 - 126 U/L 103 109 149(H)  AST 15 - 41 U/L 36 35 47(H)  ALT 0 - 44 U/L 21 23 36    Procedures  01/30/2018 EDG     01/30/2018 Colonoscopy       05/03/2008 Colonoscopy Assessment Normal examination.  Comments: NORMAL EXAMINATION.  NO POLYPS, NO CANCERS.  PATHOLOGY  03/28/2018 Surgical Pathology Diagnosis 1. Breast, left, needle core biopsy, 9 o'clock - FIBROCYSTIC CHANGES. - THERE IS NO EVIDENCE OF MALIGNANCY. 2. Breast, left, needle core biopsy, 2 o'clock - FIBROADENOMA. - THERE IS NO EVIDENCE OF MALIGNANCY. - SEE COMMENT. Microscopic Comment 1. and 2. The results are called to [facility] on 03/29/18. (JBK:gt, 03/29/18) Enid Cutter MD Pathologist, Electronic Signature (Case signed 03/29/2018) Specimen Gross and Clinical Information Specimen Comment 1. TIF: 315 PM; extracted < 30 sec; left breast masses 2. TIF: 325 PM; extracted < 30 sec Specimen(s) Obtained: 1. Breast, left, needle core biopsy, 9 o'clock 2. Breast, left, needle core biopsy, 2 o'clock Specimen Clinical Information 1. Prob FCC 2. Prob FCC vs FA vs PASH Gross 1. Received in formalin labeled with the patient's name and "Left breast 9:00", are 1 core(s) of tan-yellow, fibroadipose tissue, measuring 1.7 x 0.2 x 0.2 cm. The specimen is entirely submitted in 1 cassette(s). 1 of 2 FINAL for KENNIDEE, HEYNE ([id number]) Gross(continued) Time in  formalin 3:15 pm on 03/28/18. Cold ischemic time less than 30 secs. Craig Staggers 03/28/18 ) 2. Received in formalin labeled with  the patient's name and "Left breast 2:00", are 4 core(s) of tan-yellow, fibroadipose tissue, ranging from 0.4 x 0.2 x 0.2 cm to 2.1 x 0.2 x 0.2 cm. The specimen is entirely submitted in 1 cassette(s). Time in formalin 3:25 pm on 03/28/18. Cold ischemic time less than 30 secs. Craig Staggers 03/28/18 )  01/30/2018 Surgical Pathology      PROCEDURES  Baseline ECHO: 04/05/18 Impressions: - LVEF 55-60%, mild LVH, normal wall motion, grade 1 DD,   indeterminate LV filling pressure, trivial MR, normal LA size,   trivial TR, RVSP 25 mmHg, normal IVC.    RADIOGRAPHIC STUDIES: I have personally reviewed the radiological images as listed and agreed with the findings in the report.  03/29/2018 Bone Scan IMPRESSION: No scintigraphic evidence of osseous metastatic disease.  03/15/2018 CT AP IMPRESSION: 1. Widespread hepatic metastasis. 2. 2.6 cm lateral right breast soft tissue nodule could represent a breast primary or an incidental benign lesion. Consider correlation with mammogram and ultrasound. 3. No definite source of primary malignancy identified within the abdomen or pelvis. There is possible rectosigmoid junction wall thickening. Consider colonoscopy with attention to this area. 4. Distal esophageal wall thickening, suggesting esophagitis.  03/09/2018 US Abdomen IMPRESSION: 1. Mass lesions throughout the liver, consistent with metastatic disease. Liver as a somewhat nodular contour suggesting underlying hepatic cirrhosis. Inhomogeneous echotexture to the liver.  2. Cholelithiasis with mild gallbladder wall thickening. A degree of cholecystitis cannot be excluded by ultrasound.  3. Portions of pancreas obscured by gas. Visualized portions of pancreas appear normal.  4. Small right kidney. Etiology uncertain. This finding potentially may be indicative of renal artery stenosis. In this regard, question whether patient is hypertensive.   No results found.  ASSESSMENT & PLAN:    NETTY SULLIVANT is a 62 y.o. female with history of HTN, GERD  1. 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  She presented with fatigue, anorexia, upper abdominal and back pain and weight loss.  Exam showed a palpable right breast mass, and hepatomegaly with tenderness.   -I have reviewed her recent CT, bone scan and mammogram findings.  She has 2 right breast mass, right axillary adenopathy, and a diffuse liver metastasis. All 3 biopsy showed invasive ductal carcinoma, ER PR positive, breast mass were HER-2 positive, liver biopsy were HER-2 negative. I discussed the above findings in great details with patient and her family members, copy of the results were given to the patient today. -She has started first-line chemotherapy with weekly Taxol, and dual antibody Herceptin and Perjeta every 3 weeks, for disease control on 04/07/18. Due to infusion reaction, Taxol was changed to Abraxane from week 2. -She tolerates chemo well with mild nausea and diarrhea, still moderately fatigued with low appetite.  Her abdominal pain has improved, which is encouraging.. -She is tolerating chemotherapy better lately, her abdominal pain has much improved, which is encouraging, likely she is responding to chemotherapy.  Exam also showed less enlarged and tender liver.  -Repeat CT scan after 3 months in 06/2018 -Labs reviewed, WBC at 3.7, Hg at 9.4. CMP is still pending. Overall adequate to proceed with Abraxane today and continue weekly -We discussed chemo break.  Due to the upcoming Thanksgiving holiday, she will skip 1 week chemo the day after Thanksgiving -F/u in 2 weeks with Regan Rakers  2. Upper abdominal and back pain  -She has tried Tylenol and ibuprofen, did not help her much -I previously prescribed her tramadol 50 mg every 6 hours as needed for pain. Constipation and management reviewed with her.  She has  not been taking much due to the drowsiness. -I recommend her to try half tablets every 6 hours as needed -Pain has improved since she started chemotherapy. She takes tramadol a few times a week. -I recommend she can slow down on her Tramadol and take Tylenol or Ibuprofen for any pain.   3. Anorexia, Nausea, Diarrhea and weight loss  -Secondary to metastatic cancer.  Baseline at 168lbs -I have previously prescribed mirtazapine, she has not started, but agrees to try  -She had tremors with compazine, she stopped using it and has good relief with Zofran. Given her increase of Nausea, I suggest she try half tablet compazine as needed and she can increase Zofran to 3-4 times a day. She can use as needed.  -She has been seen by dietician Ernestene Kiel.  -As Ensure effects her diarrhea, I recommend she try boost or carnation breakfast.  -Diarrhea controlled with imodium. Nausea has improved with eating.  -She was recently able to gain 5 pounds with an improved appetite. Continue to follow up with [facility]    4. Social support  -She lives alone, has her sister and niece in [place]. Her daughter lives in a few hours away  -SW referral  -I previously reordered her letter for her to be off work due to the cancer diagnosis and chemo treatment -I will fill out her daughter's FMLA paperwork -She has been anxious about diagnosis and treatment. On 04/14/18 NP Lacie prescribed her Ativan to take as needed.   5. Goal of care discussion  -We again discussed the incurable nature of her cancer, and the overall poor prognosis, especially if she does not have good response to chemotherapy or progress on chemo -The patient understands the goal of care is palliative. -she is full code   6.  Transaminitis -Secondary to her recent liver metastasis. -Overall has improved some since she started chemotherapy -Continue monitoring closely. -Has improved lately. Liver palpated smaller on latest physical  exam.   7.  Anemia -Abrupt anemia since she started chemotherapy, she also reports episode of melena which spontaneously resolved. -I previously prescribed her Prilosec on 04/28/18 -Continue monitoring, will consider blood transfusion if hemoglobin less than 8, always symptomatic anemia. -Hg at 9.4 today (05/26/18)  8. Hypokalemia -Previously increased oral potassium to BID for now.   -K is pending  9. Elevated Creatinine  -Cr previously increased to 1.26 so I held her HCTZ for now.  -Cr back to normal now     Plan: -Labs reviewed and adequate to proceed with Abraxane today and continue weekly, continue Herceptin and pejeta every 3 weeks -f/u in 2 weeks, will order restaging scan on next visit   -will cancel her chemo on 11/29    No orders of the defined types were placed in this encounter.   All questions were answered. The patient knows to call the clinic with any problems, questions or concerns. I spent 25 minutes counseling the patient face to face. The total time spent in the appointment was 30 minutes and more than 50% was on counseling.  Oneal Deputy, am acting as scribe for [name], MD.   I have reviewed the above documentation for accuracy and completeness, and I agree with the above.  [name], MD 05/26/2018

## 2018-05-30 ENCOUNTER — Telehealth: Payer: Self-pay

## 2018-05-30 NOTE — Telephone Encounter (Signed)
Scheduled patient's CT scan, notified her it will be Tuesday 11/19 at 1:30 pm.  Nothing to eat or drink 4 hours prior.  Drink one bottle of oral solution at 11:30 and one bottle at 12:30 (she already has the oral contrast at home).  She verbalized an understanding.

## 2018-06-02 ENCOUNTER — Inpatient Hospital Stay: Payer: 59

## 2018-06-02 VITALS — BP 147/87 | HR 74 | Temp 98.0°F | Resp 18 | Wt 132.8 lb

## 2018-06-02 DIAGNOSIS — C50919 Malignant neoplasm of unspecified site of unspecified female breast: Secondary | ICD-10-CM

## 2018-06-02 DIAGNOSIS — C787 Secondary malignant neoplasm of liver and intrahepatic bile duct: Secondary | ICD-10-CM | POA: Diagnosis not present

## 2018-06-02 DIAGNOSIS — D6481 Anemia due to antineoplastic chemotherapy: Secondary | ICD-10-CM | POA: Diagnosis not present

## 2018-06-02 DIAGNOSIS — R197 Diarrhea, unspecified: Secondary | ICD-10-CM | POA: Diagnosis not present

## 2018-06-02 DIAGNOSIS — Z170 Estrogen receptor positive status [ER+]: Secondary | ICD-10-CM | POA: Diagnosis not present

## 2018-06-02 DIAGNOSIS — Z7189 Other specified counseling: Secondary | ICD-10-CM

## 2018-06-02 DIAGNOSIS — R740 Nonspecific elevation of levels of transaminase and lactic acid dehydrogenase [LDH]: Secondary | ICD-10-CM | POA: Diagnosis not present

## 2018-06-02 DIAGNOSIS — E876 Hypokalemia: Secondary | ICD-10-CM | POA: Diagnosis not present

## 2018-06-02 DIAGNOSIS — Z95828 Presence of other vascular implants and grafts: Secondary | ICD-10-CM

## 2018-06-02 DIAGNOSIS — C50411 Malignant neoplasm of upper-outer quadrant of right female breast: Secondary | ICD-10-CM | POA: Diagnosis not present

## 2018-06-02 DIAGNOSIS — Z5111 Encounter for antineoplastic chemotherapy: Secondary | ICD-10-CM | POA: Diagnosis not present

## 2018-06-02 DIAGNOSIS — R7989 Other specified abnormal findings of blood chemistry: Secondary | ICD-10-CM | POA: Diagnosis not present

## 2018-06-02 LAB — CMP (CANCER CENTER ONLY)
ALT: 18 U/L (ref 0–44)
AST: 29 U/L (ref 15–41)
Albumin: 3.7 g/dL (ref 3.5–5.0)
Alkaline Phosphatase: 98 U/L (ref 38–126)
Anion gap: 10 (ref 5–15)
BUN: 6 mg/dL — ABNORMAL LOW (ref 8–23)
CO2: 22 mmol/L (ref 22–32)
Calcium: 8.7 mg/dL — ABNORMAL LOW (ref 8.9–10.3)
Chloride: 106 mmol/L (ref 98–111)
Creatinine: 0.83 mg/dL (ref 0.44–1.00)
GFR, Est AFR Am: >60 mL/min (ref >60)
GFR, Estimated: >60 mL/min (ref >60)
Glucose, Bld: 108 mg/dL — ABNORMAL HIGH (ref 70–99)
Potassium: 3.3 mmol/L — ABNORMAL LOW (ref 3.5–5.1)
Sodium: 138 mmol/L (ref 135–145)
Total Bilirubin: 0.5 mg/dL (ref 0.3–1.2)
Total Protein: 6.6 g/dL (ref 6.5–8.1)

## 2018-06-02 LAB — CBC WITH DIFFERENTIAL (CANCER CENTER ONLY)
Abs Immature Granulocytes: 0.01 10*3/uL (ref 0.00–0.07)
Basophils Absolute: 0.1 10*3/uL (ref 0.0–0.1)
Basophils Relative: 2 %
Eosinophils Absolute: 0 10*3/uL (ref 0.0–0.5)
Eosinophils Relative: 1 %
HCT: 28 % — ABNORMAL LOW (ref 36.0–46.0)
Hemoglobin: 9.1 g/dL — ABNORMAL LOW (ref 12.0–15.0)
Immature Granulocytes: 0 %
Lymphocytes Relative: 46 %
Lymphs Abs: 1.8 10*3/uL (ref 0.7–4.0)
MCH: 28 pg (ref 26.0–34.0)
MCHC: 32.5 g/dL (ref 30.0–36.0)
MCV: 86.2 fL (ref 80.0–100.0)
Monocytes Absolute: 0.2 10*3/uL (ref 0.1–1.0)
Monocytes Relative: 5 %
Neutro Abs: 1.9 10*3/uL (ref 1.7–7.7)
Neutrophils Relative %: 46 %
Platelet Count: 298 10*3/uL (ref 150–400)
RBC: 3.25 MIL/uL — ABNORMAL LOW (ref 3.87–5.11)
RDW: 17.2 % — ABNORMAL HIGH (ref 11.5–15.5)
WBC Count: 4 10*3/uL (ref 4.0–10.5)
nRBC: 0 % (ref 0.0–0.2)

## 2018-06-02 MED ORDER — SODIUM CHLORIDE 0.9 % IV SOLN
Freq: Once | INTRAVENOUS | Status: DC
  Filled 2018-06-02: qty 250

## 2018-06-02 MED ORDER — ONDANSETRON HCL 8 MG PO TABS: 8.0000 mg | ORAL_TABLET | Freq: Once | ORAL | Status: DC

## 2018-06-02 MED ORDER — ONDANSETRON HCL 8 MG PO TABS
ORAL_TABLET | ORAL | Status: AC
  Filled 2018-06-02: qty 1

## 2018-06-02 MED ORDER — PROCHLORPERAZINE MALEATE 10 MG PO TABS
ORAL_TABLET | ORAL | Status: AC
  Filled 2018-06-02: qty 1

## 2018-06-02 MED ORDER — SODIUM CHLORIDE 0.9 % IV SOLN
Freq: Once | INTRAVENOUS | Status: AC
  Administered 2018-06-02: 10:00:00 via INTRAVENOUS
  Filled 2018-06-02: qty 250

## 2018-06-02 MED ORDER — SODIUM CHLORIDE 0.9% FLUSH
10.0000 mL | INTRAVENOUS | Status: DC | PRN
  Administered 2018-06-02: 10 mL
  Filled 2018-06-02: qty 10

## 2018-06-02 MED ORDER — PACLITAXEL PROTEIN-BOUND CHEMO INJECTION 100 MG
80.0000 mg/m2 | Freq: Once | INTRAVENOUS | Status: AC
  Administered 2018-06-02: 125 mg via INTRAVENOUS
  Filled 2018-06-02: qty 25

## 2018-06-02 MED ORDER — PROCHLORPERAZINE MALEATE 10 MG PO TABS
10.0000 mg | ORAL_TABLET | Freq: Once | ORAL | Status: AC
  Administered 2018-06-02: 10 mg via ORAL

## 2018-06-02 MED ORDER — HEPARIN SOD (PORK) LOCK FLUSH 100 UNIT/ML IV SOLN
500.0000 [IU] | Freq: Once | INTRAVENOUS | Status: AC | PRN
  Administered 2018-06-02: 500 [IU]
  Filled 2018-06-02: qty 5

## 2018-06-02 NOTE — Patient Instructions (Signed)
[facility] Discharge Instructions for Patients Receiving Chemotherapy  Today you received the following chemotherapy agents:  Abraxane.  To help prevent nausea and vomiting after your treatment, we encourage you to take your nausea medication as directed.   If you develop nausea and vomiting that is not controlled by your nausea medication, call the clinic.   BELOW ARE SYMPTOMS THAT SHOULD BE REPORTED IMMEDIATELY:  *FEVER GREATER THAN 100.5 F  *CHILLS WITH OR WITHOUT FEVER  NAUSEA AND VOMITING THAT IS NOT CONTROLLED WITH YOUR NAUSEA MEDICATION  *UNUSUAL SHORTNESS OF BREATH  *UNUSUAL BRUISING OR BLEEDING  TENDERNESS IN MOUTH AND THROAT WITH OR WITHOUT PRESENCE OF ULCERS  *URINARY PROBLEMS  *BOWEL PROBLEMS  UNUSUAL RASH Items with * indicate a potential emergency and should be followed up as soon as possible.  Feel free to call the clinic should you have any questions or concerns. The clinic phone number is [phone].  Please show the CHEMO ALERT CARD at check-in to the Emergency Department and triage nurse.   

## 2018-06-06 ENCOUNTER — Ambulatory Visit (HOSPITAL_COMMUNITY)
Admission: RE | Admit: 2018-06-06 | Discharge: 2018-06-06 | Disposition: A | Discharge status: Home / Self Care | Payer: 59 | Source: Ambulatory Visit | Attending: Hematology | Admitting: Hematology

## 2018-06-06 ENCOUNTER — Other Ambulatory Visit: Payer: Self-pay | Admitting: Hematology

## 2018-06-06 DIAGNOSIS — R918 Other nonspecific abnormal finding of lung field: Secondary | ICD-10-CM | POA: Insufficient documentation

## 2018-06-06 DIAGNOSIS — Z5111 Encounter for antineoplastic chemotherapy: Secondary | ICD-10-CM | POA: Diagnosis not present

## 2018-06-06 DIAGNOSIS — C50919 Malignant neoplasm of unspecified site of unspecified female breast: Secondary | ICD-10-CM | POA: Insufficient documentation

## 2018-06-06 DIAGNOSIS — R160 Hepatomegaly, not elsewhere classified: Secondary | ICD-10-CM | POA: Insufficient documentation

## 2018-06-06 DIAGNOSIS — I517 Cardiomegaly: Secondary | ICD-10-CM | POA: Diagnosis not present

## 2018-06-06 DIAGNOSIS — R590 Localized enlarged lymph nodes: Secondary | ICD-10-CM | POA: Insufficient documentation

## 2018-06-06 MED ORDER — SODIUM CHLORIDE (PF) 0.9 % IJ SOLN
INTRAMUSCULAR | Status: AC
  Filled 2018-06-06: qty 50

## 2018-06-06 MED ORDER — IOHEXOL 300 MG/ML  SOLN
100.0000 mL | Freq: Once | INTRAMUSCULAR | Status: AC | PRN
  Administered 2018-06-06: 100 mL via INTRAVENOUS

## 2018-06-09 ENCOUNTER — Inpatient Hospital Stay: Payer: 59

## 2018-06-09 ENCOUNTER — Encounter: Payer: Self-pay | Admitting: Nurse Practitioner

## 2018-06-09 ENCOUNTER — Inpatient Hospital Stay (HOSPITAL_BASED_OUTPATIENT_CLINIC_OR_DEPARTMENT_OTHER): Payer: 59 | Admitting: Nurse Practitioner

## 2018-06-09 VITALS — BP 149/88 | HR 78 | Temp 98.5°F | Resp 18 | Ht 64.0 in | Wt 132.7 lb

## 2018-06-09 DIAGNOSIS — E876 Hypokalemia: Secondary | ICD-10-CM

## 2018-06-09 DIAGNOSIS — C50919 Malignant neoplasm of unspecified site of unspecified female breast: Secondary | ICD-10-CM

## 2018-06-09 DIAGNOSIS — R197 Diarrhea, unspecified: Secondary | ICD-10-CM

## 2018-06-09 DIAGNOSIS — Z7189 Other specified counseling: Secondary | ICD-10-CM

## 2018-06-09 DIAGNOSIS — C787 Secondary malignant neoplasm of liver and intrahepatic bile duct: Secondary | ICD-10-CM | POA: Diagnosis not present

## 2018-06-09 DIAGNOSIS — D6481 Anemia due to antineoplastic chemotherapy: Secondary | ICD-10-CM | POA: Diagnosis not present

## 2018-06-09 DIAGNOSIS — Z170 Estrogen receptor positive status [ER+]: Secondary | ICD-10-CM

## 2018-06-09 DIAGNOSIS — R740 Nonspecific elevation of levels of transaminase and lactic acid dehydrogenase [LDH]: Secondary | ICD-10-CM | POA: Diagnosis not present

## 2018-06-09 DIAGNOSIS — C50411 Malignant neoplasm of upper-outer quadrant of right female breast: Secondary | ICD-10-CM | POA: Diagnosis not present

## 2018-06-09 DIAGNOSIS — Z95828 Presence of other vascular implants and grafts: Secondary | ICD-10-CM

## 2018-06-09 DIAGNOSIS — R7989 Other specified abnormal findings of blood chemistry: Secondary | ICD-10-CM | POA: Diagnosis not present

## 2018-06-09 DIAGNOSIS — Z5111 Encounter for antineoplastic chemotherapy: Secondary | ICD-10-CM | POA: Diagnosis not present

## 2018-06-09 LAB — CMP (CANCER CENTER ONLY)
ALT: 17 U/L (ref 0–44)
AST: 32 U/L (ref 15–41)
Albumin: 3.9 g/dL (ref 3.5–5.0)
Alkaline Phosphatase: 94 U/L (ref 38–126)
Anion gap: 8 (ref 5–15)
BUN: 7 mg/dL — ABNORMAL LOW (ref 8–23)
CO2: 27 mmol/L (ref 22–32)
Calcium: 9 mg/dL (ref 8.9–10.3)
Chloride: 102 mmol/L (ref 98–111)
Creatinine: 0.92 mg/dL (ref 0.44–1.00)
GFR, Est AFR Am: >60 mL/min (ref >60)
GFR, Estimated: >60 mL/min (ref >60)
Glucose, Bld: 101 mg/dL — ABNORMAL HIGH (ref 70–99)
Potassium: 3.1 mmol/L — ABNORMAL LOW (ref 3.5–5.1)
Sodium: 137 mmol/L (ref 135–145)
Total Bilirubin: 0.5 mg/dL (ref 0.3–1.2)
Total Protein: 6.8 g/dL (ref 6.5–8.1)

## 2018-06-09 LAB — CBC WITH DIFFERENTIAL (CANCER CENTER ONLY)
Abs Immature Granulocytes: 0.02 10*3/uL (ref 0.00–0.07)
Basophils Absolute: 0.1 10*3/uL (ref 0.0–0.1)
Basophils Relative: 1 %
Eosinophils Absolute: 0.1 10*3/uL (ref 0.0–0.5)
Eosinophils Relative: 3 %
HCT: 27.9 % — ABNORMAL LOW (ref 36.0–46.0)
Hemoglobin: 9.3 g/dL — ABNORMAL LOW (ref 12.0–15.0)
Immature Granulocytes: 1 %
Lymphocytes Relative: 44 %
Lymphs Abs: 1.8 10*3/uL (ref 0.7–4.0)
MCH: 28.9 pg (ref 26.0–34.0)
MCHC: 33.3 g/dL (ref 30.0–36.0)
MCV: 86.6 fL (ref 80.0–100.0)
Monocytes Absolute: 0.2 10*3/uL (ref 0.1–1.0)
Monocytes Relative: 6 %
Neutro Abs: 1.8 10*3/uL (ref 1.7–7.7)
Neutrophils Relative %: 45 %
Platelet Count: 274 10*3/uL (ref 150–400)
RBC: 3.22 MIL/uL — ABNORMAL LOW (ref 3.87–5.11)
RDW: 17.4 % — ABNORMAL HIGH (ref 11.5–15.5)
WBC Count: 4 10*3/uL (ref 4.0–10.5)
nRBC: 0.5 % — ABNORMAL HIGH (ref 0.0–0.2)

## 2018-06-09 MED ORDER — TRASTUZUMAB CHEMO 150 MG IV SOLR
357.0000 mg | Freq: Once | INTRAVENOUS | Status: AC
  Administered 2018-06-09: 357 mg via INTRAVENOUS
  Filled 2018-06-09: qty 17

## 2018-06-09 MED ORDER — SODIUM CHLORIDE 0.9% FLUSH
10.0000 mL | INTRAVENOUS | Status: DC | PRN
  Administered 2018-06-09: 10 mL
  Filled 2018-06-09: qty 10

## 2018-06-09 MED ORDER — DIPHENOXYLATE-ATROPINE 2.5-0.025 MG PO TABS: 2.0000 | ORAL_TABLET | Freq: Four times a day (QID) | ORAL | 0 refills | Status: DC | PRN

## 2018-06-09 MED ORDER — ONDANSETRON HCL 8 MG PO TABS: 8.0000 mg | ORAL_TABLET | Freq: Once | ORAL | Status: DC

## 2018-06-09 MED ORDER — PROCHLORPERAZINE MALEATE 10 MG PO TABS
ORAL_TABLET | ORAL | Status: AC
  Filled 2018-06-09: qty 1

## 2018-06-09 MED ORDER — DIPHENHYDRAMINE HCL 50 MG/ML IJ SOLN
INTRAMUSCULAR | Status: AC
  Filled 2018-06-09: qty 1

## 2018-06-09 MED ORDER — DIPHENHYDRAMINE HCL 50 MG/ML IJ SOLN
50.0000 mg | Freq: Once | INTRAMUSCULAR | Status: AC
  Administered 2018-06-09: 50 mg via INTRAVENOUS

## 2018-06-09 MED ORDER — SODIUM CHLORIDE 0.9 % IV SOLN
Freq: Once | INTRAVENOUS | Status: AC
  Administered 2018-06-09: 10:00:00 via INTRAVENOUS
  Filled 2018-06-09: qty 250

## 2018-06-09 MED ORDER — PROCHLORPERAZINE MALEATE 10 MG PO TABS
10.0000 mg | ORAL_TABLET | Freq: Once | ORAL | Status: AC
  Administered 2018-06-09: 10 mg via ORAL

## 2018-06-09 MED ORDER — ONDANSETRON HCL 8 MG PO TABS
ORAL_TABLET | ORAL | Status: AC
  Filled 2018-06-09: qty 1

## 2018-06-09 MED ORDER — HEPARIN SOD (PORK) LOCK FLUSH 100 UNIT/ML IV SOLN
500.0000 [IU] | Freq: Once | INTRAVENOUS | Status: AC | PRN
  Administered 2018-06-09: 500 [IU]
  Filled 2018-06-09: qty 5

## 2018-06-09 MED ORDER — ACETAMINOPHEN 325 MG PO TABS
650.0000 mg | ORAL_TABLET | Freq: Once | ORAL | Status: AC
  Administered 2018-06-09: 650 mg via ORAL

## 2018-06-09 MED ORDER — PACLITAXEL PROTEIN-BOUND CHEMO INJECTION 100 MG
80.0000 mg/m2 | Freq: Once | INTRAVENOUS | Status: AC
  Administered 2018-06-09: 125 mg via INTRAVENOUS
  Filled 2018-06-09: qty 25

## 2018-06-09 MED ORDER — POTASSIUM CHLORIDE CRYS ER 20 MEQ PO TBCR: 20.0000 meq | EXTENDED_RELEASE_TABLET | Freq: Two times a day (BID) | ORAL | 1 refills | Status: DC

## 2018-06-09 MED ORDER — SODIUM CHLORIDE 0.9 % IV SOLN
420.0000 mg | Freq: Once | INTRAVENOUS | Status: AC
  Administered 2018-06-09: 420 mg via INTRAVENOUS
  Filled 2018-06-09: qty 14

## 2018-06-09 MED ORDER — ACETAMINOPHEN 325 MG PO TABS
ORAL_TABLET | ORAL | Status: AC
  Filled 2018-06-09: qty 2

## 2018-06-09 NOTE — Patient Instructions (Signed)
[facility] Discharge Instructions for Patients Receiving Chemotherapy  Today you received the following chemotherapy agents Herceptin, Perjeta, Abraxane.  To help prevent nausea and vomiting after your treatment, we encourage you to take your nausea medication as directed.  If you develop nausea and vomiting that is not controlled by your nausea medication, call the clinic.   BELOW ARE SYMPTOMS THAT SHOULD BE REPORTED IMMEDIATELY:  *FEVER GREATER THAN 100.5 F  *CHILLS WITH OR WITHOUT FEVER  NAUSEA AND VOMITING THAT IS NOT CONTROLLED WITH YOUR NAUSEA MEDICATION  *UNUSUAL SHORTNESS OF BREATH  *UNUSUAL BRUISING OR BLEEDING  TENDERNESS IN MOUTH AND THROAT WITH OR WITHOUT PRESENCE OF ULCERS  *URINARY PROBLEMS  *BOWEL PROBLEMS  UNUSUAL RASH Items with * indicate a potential emergency and should be followed up as soon as possible.  Feel free to call the clinic should you have any questions or concerns. The clinic phone number is [phone].  Please show the CHEMO ALERT CARD at check-in to the Emergency Department and triage nurse.   

## 2018-06-09 NOTE — Progress Notes (Addendum)
[place]  [phone] [phone]  Clinic Follow up Note   Patient Care Team: Teresa Bacon, NP as PCP - General (Family Medicine) Teresa Craver, MD as Consulting Physician (Gastroenterology) Teresa Merle, MD as Consulting Physician (Hematology) 06/09/2018  SUMMARY OF ONCOLOGIC HISTORY: Oncology History   Cancer Staging Metastatic breast cancer [facility]) Staging form: Breast, AJCC 8th Edition - Clinical stage from 03/24/2018: Stage IV (cT2, cN1, pM1, G3, ER+, PR+, HER2+) - Signed by Teresa Merle, MD on 03/30/2018       Metastatic breast cancer ([place])   01/30/2018 Procedure    Colonoscopy showed small polyp in the sigmoid colon, removed, the exam of colon including the terminal ileum was otherwise negative.    01/30/2018 Procedure    EGD by Dr. Collene Hays showed small hiatal hernia, a 8 mm polypoid lesion in the cardia, biopsied.    03/09/2018 Imaging    03/09/2018 US Abdomen IMPRESSION: 1. Mass lesions throughout the liver, consistent with metastatic disease. Liver as a somewhat nodular contour suggesting underlying hepatic cirrhosis. Inhomogeneous echotexture to the liver.  2. Cholelithiasis with mild gallbladder wall thickening. A degree of cholecystitis cannot be excluded by ultrasound.  3. Portions of pancreas obscured by gas. Visualized portions of pancreas appear normal.  4. Small right Hays. Etiology uncertain. This finding potentially may be indicative of renal artery stenosis. In this regard, question whether patient is hypertensive.    03/15/2018 Imaging    CT CAP with contrast  IMPRESSION: 1. Widespread hepatic metastasis. 2. 2.6 cm lateral right breast soft tissue nodule could represent a breast primary or an incidental benign lesion. Consider correlation with mammogram and ultrasound. 3. No definite source of primary malignancy identified within the abdomen or pelvis. There is possible rectosigmoid junction wall thickening. Consider  colonoscopy with attention to this area. 4. Distal esophageal wall thickening, suggesting esophagitis.    03/24/2018 Cancer Staging    Staging form: Breast, AJCC 8th Edition - Clinical stage from 03/24/2018: Stage IV (cT2, cN1, pM1, G3, ER+, PR+, HER2+) - Signed by Teresa Merle, MD on 03/30/2018    03/24/2018 Initial Biopsy    Diagnosis 1. 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 Breast, right, needle core biopsy, 9 o'clock, 7cm from nipple - INVASIVE DUCTAL CARCINOMA. -The carcinoma is somewhat morphologically dissimilar from that in part 1. It appears grade III 3. Lymph node, needle/core biopsy, right axillary - METASTATIC CARCINOMA IN 1 OF 1 LYMPH NODE (1/1).    03/24/2018 Receptors her2    Breast biopsy: 1. Estrogen Receptor: 40%, POSITIVE, STRONG-MODERATE STAINING INTENSITY Progesterone Receptor: 70%, POSITIVE, STRONG STAINING INTENSITY Proliferation Marker Ki67: 20% HER 2 equivocal by IHC 2+, POSITIVE by FISH, ratio 2.4 and copy #4.2  2. Estrogen Receptor: 60%, POSITIVE, MODERATE STAINING INTENSITY Progesterone Receptor: 40%, POSITIVE, MODERATE STAINING INTENSITY Proliferation Marker Ki67: 20% HER2 (+) by IHC 3+    03/24/2018 Initial Diagnosis    Metastatic breast cancer ([place])    03/27/2018 Pathology Results    Diagnosis Liver, needle/core biopsy, Right - METASTATIC CARCINOMA TO LIVER, CONSISTENT WITH PATIENTS CLINICAL HISTORY OF PRIMARY BREAST CARCINOMA.  ER 80%+ PR40%+ HER2- (by [facility], IHC 2+)  Ki67 50%     03/28/2018 Pathology Results    03/28/2018 Surgical Pathology Diagnosis 1. Breast, left, needle core biopsy, 9 o'clock - FIBROCYSTIC CHANGES. - THERE IS NO EVIDENCE OF MALIGNANCY. 2. Breast, left, needle core biopsy, 2 o'clock - FIBROADENOMA. -  THERE IS NO EVIDENCE OF MALIGNANCY. - SEE COMMENT.    03/29/2018 Imaging    03/29/2018 Bone Scan IMPRESSION: No scintigraphic evidence  of osseous metastatic disease.    03/30/2018 Imaging    Bone scan  IMPRESSION: No scintigraphic evidence of osseous metastatic disease.     04/07/2018 -  Chemotherapy    First line chemo weekly Taxol and herceptin/Perjeta every 3 weeks starting 04/07/18. She developed infusion reaction to taxol and it was discontinued. Added Abraxane on C1D8.     06/06/2018 Imaging    CT CAP IMPRESSION: 1. Generally improved appearance, with reduced axillary adenopathy and reduced enhancing component of the hepatic masses, with some of the hepatic mass is moderately smaller than on the prior exam. Reduced size of the right lateral breast mass compared to the prior 03/15/2018 exam. 2. New mild interstitial accentuation in the lungs, significance uncertain. Part of this appearance may be due to lower lung volumes on today's exam. 3. Mild wall thickening in the descending colon and upper rectum suggesting low-grade colitis/inflammation. Prominent stool throughout the colon favors constipation. 4. Other imaging findings of potential clinical significance: Aortic Atherosclerosis (ICD10-I70.0). Mild cardiomegaly. Mild nodularity in the right lower lobe appears stable. Contracted and thick-walled gallbladder.    CURRENT THERAPY: First line chemo weekly Taxol and Herceptin/Perjeta every 3 weeks starting 04/07/18. She developed infusion reaction to taxol and it was discontinued. Changed to Abraxane on C1D8.   INTERVAL HISTORY: Teresa Hays returns for follow up and chemo as scheduled. She completed cycle 3 weekly abraxane and q3 weeks herceptin/perjeta. She underwent restaging CT. She feels well. Her fatigue has improved and she is able to care for herself and her home much better. Appetite is good, not taking marinol or mirtazapine. She eats best in the morning. Denies n/v/c. She has 1-2 loose stool per day. Imodium does not help her much. No blood in stool. She has intermittent tingling to middle toe of left foot,  otherwise no neuropathy. Denies abdominal or bone pain. Denies fever, chills, cough, chest pain, dyspnea, or leg edema.    MEDICAL HISTORY:  Past Medical History:  Diagnosis Date  . Cancer ([place])   . GERD (gastroesophageal reflux disease)   . Hypertension     SURGICAL HISTORY: Past Surgical History:  Procedure Laterality Date  . COLONOSCOPY    . ESOPHAGOGASTRODUODENOSCOPY ENDOSCOPY    . IR IMAGING GUIDED PORT INSERTION  04/04/2018    I have reviewed the social history and family history with the patient and they are unchanged from previous note.  ALLERGIES:  has No Known Allergies.  MEDICATIONS:  Current Outpatient Medications  Medication Sig Dispense Refill  . amLODipine (NORVASC) 5 MG tablet Take 5 mg by mouth daily.     Marland Kitchen atenolol (TENORMIN) 50 MG tablet Take 50 mg by mouth every evening.     . lidocaine-prilocaine (EMLA) cream Apply to affected area once 30 g 3  . LORazepam (ATIVAN) 0.5 MG tablet Take 1 tablet (0.5 mg total) by mouth at bedtime as needed for anxiety. 30 tablet 0  . omeprazole (PRILOSEC) 20 MG capsule Take 1 capsule (20 mg total) by mouth daily. 30 capsule 1  . ondansetron (ZOFRAN) 8 MG tablet Take 1 tablet (8 mg total) by mouth 2 (two) times daily as needed (Nausea or vomiting). 30 tablet 1  . ondansetron (ZOFRAN) 8 MG tablet TAKE 1 TABLET BY MOUTH EVERY 8 HOURS AS NEEDED FOR NAUSEA OR VOMITING 30 tablet 0  . potassium chloride SA (K-DUR,KLOR-CON)  20 MEQ tablet Take 1 tablet (20 mEq total) by mouth 2 (two) times daily. 60 tablet 1  . traMADol (ULTRAM) 50 MG tablet Take 1 tablet (50 mg total) by mouth every 6 (six) hours as needed for moderate pain or severe pain. 30 tablet 0  . diphenoxylate-atropine (LOMOTIL) 2.5-0.025 MG tablet Take 2 tablets by mouth 4 (four) times daily as needed for diarrhea or loose stools. 30 tablet 0  . dronabinol (MARINOL) 2.5 MG capsule Take 1 capsule twice daily before meals as needed (Patient not taking: Reported on 06/09/2018) 30  capsule 1  . mirtazapine (REMERON) 7.5 MG tablet Take 1 tablet (7.5 mg total) by mouth at bedtime. (Patient not taking: Reported on 06/09/2018) 30 tablet 0   No current facility-administered medications for this visit.    Facility-Administered Medications Ordered in Other Visits  Medication Dose Route Frequency Provider Last Rate Last Dose  . heparin lock flush 100 unit/mL  500 Units Intracatheter Once PRN Teresa Lose, MD      . ondansetron (ZOFRAN) tablet 8 mg  8 mg Oral Once Teresa Merle, MD      . PACLitaxel-protein bound (ABRAXANE) chemo infusion 125 mg  80 mg/m2 (Treatment Plan Recorded) Intravenous Once Teresa Lose, MD      . pertuzumab (PERJETA) 420 mg in sodium chloride 0.9 % 250 mL chemo infusion  420 mg Intravenous Once Teresa Lose, MD      . sodium chloride flush (NS) 0.9 % injection 10 mL  10 mL Intracatheter PRN Teresa Lose, MD      . trastuzumab (HERCEPTIN) 357 mg in sodium chloride 0.9 % 250 mL chemo infusion  357 mg Intravenous Once Teresa Lose, MD 534 mL/hr at 06/09/18 1030 357 mg at 06/09/18 1030    PHYSICAL EXAMINATION: ECOG PERFORMANCE STATUS: 1 - Symptomatic but completely ambulatory  Vitals:   06/09/18 0843  BP: (!) 149/88  Pulse: 78  Resp: 18  Temp: 98.5 F (36.9 C)  SpO2: 100%   Filed Weights   06/09/18 0843  Weight: 132 lb 11.2 oz (60.2 kg)    GENERAL:alert, no distress and comfortable SKIN: dry skin. no rashes or significant lesions EYES: sclera clear OROPHARYNX:no thrush or ulcers   LYMPH:  no palpable cervical or supraclavicular lymphadenopathy  LUNGS: clear to auscultation with normal breathing effort HEART: regular rate & rhythm, no lower extremity edema ABDOMEN:abdomen soft, non-tender and normal bowel sounds. Mild hepatomegaly.  NEURO: alert & oriented x 3 with fluent speech, no focal motor/sensory deficits Breast: exam: inspection reveals them to be symmetrical without nipple discharge or inversion. The previous mass in the right breast  at 9:00 is no longer palpable; No other palpable mass that I could appreciate in either breast or axilla PAC without erythema   LABORATORY DATA:  I have reviewed the data as listed CBC Latest Ref Rng & Units 06/09/2018 06/02/2018 05/26/2018  WBC 4.0 - 10.5 K/uL 4.0 4.0 3.7(L)  Hemoglobin 12.0 - 15.0 g/dL 9.3(L) 9.1(L) 9.4(L)  Hematocrit 36.0 - 46.0 % 27.9(L) 28.0(L) 28.6(L)  Platelets 150 - 400 K/uL 274 298 311     CMP Latest Ref Rng & Units 06/09/2018 06/02/2018 05/26/2018  Glucose 70 - 99 mg/dL 101(H) 108(H) 89  BUN 8 - 23 mg/dL 7(L) 6(L) 7(L)  Creatinine 0.44 - 1.00 mg/dL 0.92 0.83 0.90  Sodium 135 - 145 mmol/L 137 138 138  Potassium 3.5 - 5.1 mmol/L 3.1(L) 3.3(L) 3.6  Chloride 98 - 111 mmol/L 102 106 108  CO2 22 -  32 mmol/L _0 Calcium 8.9 - 10.3 mg/dL 9.0 8.7(L) 9.3  Total Protein 6.5 - 8.1 g/dL 6.8 6.6 6.7  Total Bilirubin 0.3 - 1.2 mg/dL 0.5 0.5 0.4  Alkaline Phos 38 - 126 U/L 94 98 103  AST 15 - 41 U/L 32 29 36  ALT 0 - 44 U/L _1 RADIOGRAPHIC STUDIES: I have personally reviewed the radiological images as listed and agreed with the findings in the report. No results found.   ASSESSMENT & PLAN: Teresa Hays is a 62 y.o. female with history of HTN, GERD  1. Metastatic right breast cancer to liver, cT2N1M1, stage IV, ER+/PR+/HER2+, Liver mets ER+/PR+/HER2- -Teresa Hays appears stable. She completed 3 cycles (approx 2 months) of chemotherapy including weekly abraxane and q3 weeks herceptin/perjeta. She tolerates chemotherapy well overall, except diarrhea. Other symptoms such as fatigue and pain have improved.  -Her right breast mass is not palpable, clinically she is responding well to treatment -She underwent restaging CT CAP on 11/19; this was a shared visit with Dr. Burr Medico who reviewed the images with her. Imaging demonstrates overall response to therapy with reduction in size of the right axillary node from 2.1 cm to 1.2 cm, reduced size of right  breast mass from 2.6 cm to 1.8 x 1.3 cm, and reduced size of hepatic metastases including index liver lesion from 3.5 x3.2 to 2.6 cm.  -Dr. Burr Medico recommends to continue current treatment plan with weekly abraxane and q3 week herceptin/perjeta for 3-4 more months, to total approx 6 months of chemo. We will restage in 2-3 more months to monitor her response to therapy. If she continues to have a good response, will plan to continue with maintenance herceptin/perjeta. The patient agrees with the plan.  -It is ok for her to have a week treatment break around the holidays.  -Labs reviewed, adequate to proceed with cycle 4 day 1 abraxane, herceptin, and perjeta today -return in 1 week for weekly abraxane -f/u in 2 weeks  2. Upper abdominal and back pain  -currently denies pain   3. Anorexia, Nausea, Diarrhea and weight loss  -her appetite and weight are stable off marinol and mirtazapine. She eats best early in the day.  -she has persistent diarrhea, imodium does not help much. I prescribed lomotil today and increased potassium supplement. CT from 11/19 shows mild wall thickening in the descending colon and upper rectum, suggesting low grade colitis/inflammation. -will monitor   4. Social support  -she lives alone, has been referred to SW; her daughter is with her for appts -I previously prescribed ativan PRN for anxiety r/t to diagnosis and treatment.   5. Goal of care discussion  -she is full code   6.  Transaminitis -Secondary to her recent liver metastasis. -Overall has improved some since she started chemotherapy -resolved; hepatomegaly has also improved   7.  Anemia -Abrupt anemia since she started chemotherapy, she also reports episode of melena which spontaneously resolved. -Dr. Burr Medico previously prescribed her Prilosec on 04/28/18 -Hgb stable, 9.3; no blood transfusion needed   8. Hypokalemia -previously on oral K once daily, she continues to have diarrhea. Imodium is not  helping.  -I prescribed lomotil and increased oral K to 20 mEq BID today 11/22 and refilled for her  9. Elevated Creatinine  -resolved. Cr normal lately    Plan: -Labs, CT reviewed, good response to chemo -Continue weekly abraxane and q3 week herceptin/perjeta, all due today -Return in 1  week for weekly abraxane -F/u in 2 weeks  -Patient to be off 12/27 for daughter's wedding  -Increase K to 1 tab BID -Rx: lomotil 2 tabs 4x daily PRN for diarrhea   All questions were answered. The patient knows to call the clinic with any problems, questions or concerns. No barriers to learning was detected.     Alla Feeling, NP 06/09/18   Addendum  I have seen the patient, examined her. I agree with the assessment and and plan and have edited the notes.   I have reviewed your Lant[place] restaging CT scan images in person, and agrees with the radiologist interpretation.  Scan was reviewed with patient and her daughter.  She has had a good response to chemotherapy, she is clinically also doing better overall, I recommend her to continue current chemotherapy, plan for 6 to 12 months chemo, then switch to maintenance Herceptin and pejeta, and add AI.  Patient is quite pleased with the results, and agrees to continue treatment.  Due to the upcoming holiday and some fatigue from chemo, will consider change chemo to 3 weeks on and one week off.   Teresa Hays  06/09/2018

## 2018-06-10 LAB — CANCER ANTIGEN 27.29: CA 27.29: 1056.8 U/mL — ABNORMAL HIGH (ref 0.0–38.6)

## 2018-06-16 ENCOUNTER — Inpatient Hospital Stay: Payer: 59

## 2018-06-16 VITALS — BP 143/93 | HR 78 | Temp 98.2°F | Resp 18

## 2018-06-16 DIAGNOSIS — C50919 Malignant neoplasm of unspecified site of unspecified female breast: Secondary | ICD-10-CM

## 2018-06-16 DIAGNOSIS — R740 Nonspecific elevation of levels of transaminase and lactic acid dehydrogenase [LDH]: Secondary | ICD-10-CM | POA: Diagnosis not present

## 2018-06-16 DIAGNOSIS — C787 Secondary malignant neoplasm of liver and intrahepatic bile duct: Secondary | ICD-10-CM | POA: Diagnosis not present

## 2018-06-16 DIAGNOSIS — D6481 Anemia due to antineoplastic chemotherapy: Secondary | ICD-10-CM | POA: Diagnosis not present

## 2018-06-16 DIAGNOSIS — R197 Diarrhea, unspecified: Secondary | ICD-10-CM | POA: Diagnosis not present

## 2018-06-16 DIAGNOSIS — R7989 Other specified abnormal findings of blood chemistry: Secondary | ICD-10-CM | POA: Diagnosis not present

## 2018-06-16 DIAGNOSIS — E876 Hypokalemia: Secondary | ICD-10-CM | POA: Diagnosis not present

## 2018-06-16 DIAGNOSIS — C50411 Malignant neoplasm of upper-outer quadrant of right female breast: Secondary | ICD-10-CM | POA: Diagnosis not present

## 2018-06-16 DIAGNOSIS — Z5111 Encounter for antineoplastic chemotherapy: Secondary | ICD-10-CM | POA: Diagnosis not present

## 2018-06-16 DIAGNOSIS — Z95828 Presence of other vascular implants and grafts: Secondary | ICD-10-CM

## 2018-06-16 DIAGNOSIS — Z7189 Other specified counseling: Secondary | ICD-10-CM

## 2018-06-16 DIAGNOSIS — Z170 Estrogen receptor positive status [ER+]: Secondary | ICD-10-CM | POA: Diagnosis not present

## 2018-06-16 LAB — CBC WITH DIFFERENTIAL (CANCER CENTER ONLY)
Abs Immature Granulocytes: 0.02 10*3/uL (ref 0.00–0.07)
Basophils Absolute: 0.1 10*3/uL (ref 0.0–0.1)
Basophils Relative: 2 %
Eosinophils Absolute: 0.1 10*3/uL (ref 0.0–0.5)
Eosinophils Relative: 2 %
HCT: 26.2 % — ABNORMAL LOW (ref 36.0–46.0)
Hemoglobin: 8.6 g/dL — ABNORMAL LOW (ref 12.0–15.0)
Immature Granulocytes: 1 %
Lymphocytes Relative: 47 %
Lymphs Abs: 1.8 10*3/uL (ref 0.7–4.0)
MCH: 28.7 pg (ref 26.0–34.0)
MCHC: 32.8 g/dL (ref 30.0–36.0)
MCV: 87.3 fL (ref 80.0–100.0)
Monocytes Absolute: 0.2 10*3/uL (ref 0.1–1.0)
Monocytes Relative: 7 %
Neutro Abs: 1.5 10*3/uL — ABNORMAL LOW (ref 1.7–7.7)
Neutrophils Relative %: 41 %
Platelet Count: 268 10*3/uL (ref 150–400)
RBC: 3 MIL/uL — ABNORMAL LOW (ref 3.87–5.11)
RDW: 18.1 % — ABNORMAL HIGH (ref 11.5–15.5)
WBC Count: 3.7 10*3/uL — ABNORMAL LOW (ref 4.0–10.5)
nRBC: 0 % (ref 0.0–0.2)

## 2018-06-16 LAB — CMP (CANCER CENTER ONLY)
ALT: 16 U/L (ref 0–44)
AST: 30 U/L (ref 15–41)
Albumin: 3.7 g/dL (ref 3.5–5.0)
Alkaline Phosphatase: 93 U/L (ref 38–126)
Anion gap: 8 (ref 5–15)
BUN: 8 mg/dL (ref 8–23)
CO2: 24 mmol/L (ref 22–32)
Calcium: 9.5 mg/dL (ref 8.9–10.3)
Chloride: 104 mmol/L (ref 98–111)
Creatinine: 0.83 mg/dL (ref 0.44–1.00)
GFR, Est AFR Am: >60 mL/min (ref >60)
GFR, Estimated: >60 mL/min (ref >60)
Glucose, Bld: 100 mg/dL — ABNORMAL HIGH (ref 70–99)
Potassium: 3.6 mmol/L (ref 3.5–5.1)
Sodium: 136 mmol/L (ref 135–145)
Total Bilirubin: 0.5 mg/dL (ref 0.3–1.2)
Total Protein: 6.5 g/dL (ref 6.5–8.1)

## 2018-06-16 MED ORDER — SODIUM CHLORIDE 0.9 % IV SOLN
Freq: Once | INTRAVENOUS | Status: AC
  Administered 2018-06-16: 09:00:00 via INTRAVENOUS
  Filled 2018-06-16: qty 250

## 2018-06-16 MED ORDER — ONDANSETRON HCL 8 MG PO TABS: 8.0000 mg | ORAL_TABLET | Freq: Once | ORAL | Status: DC

## 2018-06-16 MED ORDER — SODIUM CHLORIDE 0.9% FLUSH
10.0000 mL | INTRAVENOUS | Status: DC | PRN
  Administered 2018-06-16: 10 mL
  Filled 2018-06-16: qty 10

## 2018-06-16 MED ORDER — PROCHLORPERAZINE MALEATE 10 MG PO TABS
10.0000 mg | ORAL_TABLET | Freq: Once | ORAL | Status: AC
  Administered 2018-06-16: 10 mg via ORAL

## 2018-06-16 MED ORDER — HEPARIN SOD (PORK) LOCK FLUSH 100 UNIT/ML IV SOLN
500.0000 [IU] | Freq: Once | INTRAVENOUS | Status: AC | PRN
  Administered 2018-06-16: 500 [IU]
  Filled 2018-06-16: qty 5

## 2018-06-16 MED ORDER — PACLITAXEL PROTEIN-BOUND CHEMO INJECTION 100 MG
80.0000 mg/m2 | Freq: Once | INTRAVENOUS | Status: AC
  Administered 2018-06-16: 125 mg via INTRAVENOUS
  Filled 2018-06-16: qty 25

## 2018-06-16 MED ORDER — PROCHLORPERAZINE MALEATE 10 MG PO TABS
ORAL_TABLET | ORAL | Status: AC
  Filled 2018-06-16: qty 1

## 2018-06-16 NOTE — Patient Instructions (Signed)
[facility] Discharge Instructions for Patients Receiving Chemotherapy  Today you received the following chemotherapy agents:  Abraxane.  To help prevent nausea and vomiting after your treatment, we encourage you to take your nausea medication as directed.   If you develop nausea and vomiting that is not controlled by your nausea medication, call the clinic.   BELOW ARE SYMPTOMS THAT SHOULD BE REPORTED IMMEDIATELY:  *FEVER GREATER THAN 100.5 F  *CHILLS WITH OR WITHOUT FEVER  NAUSEA AND VOMITING THAT IS NOT CONTROLLED WITH YOUR NAUSEA MEDICATION  *UNUSUAL SHORTNESS OF BREATH  *UNUSUAL BRUISING OR BLEEDING  TENDERNESS IN MOUTH AND THROAT WITH OR WITHOUT PRESENCE OF ULCERS  *URINARY PROBLEMS  *BOWEL PROBLEMS  UNUSUAL RASH Items with * indicate a potential emergency and should be followed up as soon as possible.  Feel free to call the clinic should you have any questions or concerns. The clinic phone number is [phone].  Please show the CHEMO ALERT CARD at check-in to the Emergency Department and triage nurse.   

## 2018-06-16 NOTE — Patient Instructions (Signed)

## 2018-06-19 NOTE — Progress Notes (Signed)
FMLA successfully faxed to [employer] at [phone]. Mailed copy to patient address on file. 

## 2018-06-22 NOTE — Progress Notes (Signed)
[place]  [phone] [phone]  Clinic Follow up Note   Patient Care Team: Levin Bacon, NP as PCP - General (Family Medicine) [name], MD as Consulting Physician (Gastroenterology) [name], MD as Consulting Physician (Hematology) 06/23/2018  SUMMARY OF ONCOLOGIC HISTORY: Oncology History   Cancer Staging Metastatic breast cancer [facility]) Staging form: Breast, AJCC 8th Edition - Clinical stage from 03/24/2018: Stage IV (cT2, cN1, pM1, G3, ER+, PR+, HER2+) - Signed by [name], MD on 03/30/2018       Metastatic breast cancer ([place])   01/30/2018 Procedure    Colonoscopy showed small polyp in the sigmoid colon, removed, the exam of colon including the terminal ileum was otherwise negative.    01/30/2018 Procedure    EGD by Dr. Collene Mares showed small hiatal hernia, a 8 mm polypoid lesion in the cardia, biopsied.    03/09/2018 Imaging    03/09/2018 US Abdomen IMPRESSION: 1. Mass lesions throughout the liver, consistent with metastatic disease. Liver as a somewhat nodular contour suggesting underlying hepatic cirrhosis. Inhomogeneous echotexture to the liver.  2. Cholelithiasis with mild gallbladder wall thickening. A degree of cholecystitis cannot be excluded by ultrasound.  3. Portions of pancreas obscured by gas. Visualized portions of pancreas appear normal.  4. Small right kidney. Etiology uncertain. This finding potentially may be indicative of renal artery stenosis. In this regard, question whether patient is hypertensive.    03/15/2018 Imaging    CT CAP with contrast  IMPRESSION: 1. Widespread hepatic metastasis. 2. 2.6 cm lateral right breast soft tissue nodule could represent a breast primary or an incidental benign lesion. Consider correlation with mammogram and ultrasound. 3. No definite source of primary malignancy identified within the abdomen or pelvis. There is possible rectosigmoid junction wall thickening. Consider  colonoscopy with attention to this area. 4. Distal esophageal wall thickening, suggesting esophagitis.    03/24/2018 Cancer Staging    Staging form: Breast, AJCC 8th Edition - Clinical stage from 03/24/2018: Stage IV (cT2, cN1, pM1, G3, ER+, PR+, HER2+) - Signed by [name], MD on 03/30/2018    03/24/2018 Initial Biopsy    Diagnosis 1. 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 Breast, right, needle core biopsy, 9 o'clock, 7cm from nipple - INVASIVE DUCTAL CARCINOMA. -The carcinoma is somewhat morphologically dissimilar from that in part 1. It appears grade III 3. Lymph node, needle/core biopsy, right axillary - METASTATIC CARCINOMA IN 1 OF 1 LYMPH NODE (1/1).    03/24/2018 Receptors her2    Breast biopsy: 1. Estrogen Receptor: 40%, POSITIVE, STRONG-MODERATE STAINING INTENSITY Progesterone Receptor: 70%, POSITIVE, STRONG STAINING INTENSITY Proliferation Marker Ki67: 20% HER 2 equivocal by IHC 2+, POSITIVE by FISH, ratio 2.4 and copy #4.2  2. Estrogen Receptor: 60%, POSITIVE, MODERATE STAINING INTENSITY Progesterone Receptor: 40%, POSITIVE, MODERATE STAINING INTENSITY Proliferation Marker Ki67: 20% HER2 (+) by IHC 3+    03/24/2018 Initial Diagnosis    Metastatic breast cancer ([place])    03/27/2018 Pathology Results    Diagnosis Liver, needle/core biopsy, Right - METASTATIC CARCINOMA TO LIVER, CONSISTENT WITH PATIENTS CLINICAL HISTORY OF PRIMARY BREAST CARCINOMA.  ER 80%+ PR40%+ HER2- (by [facility], IHC 2+)  Ki67 50%     03/28/2018 Pathology Results    03/28/2018 Surgical Pathology Diagnosis 1. Breast, left, needle core biopsy, 9 o'clock - FIBROCYSTIC CHANGES. - THERE IS NO EVIDENCE OF MALIGNANCY. 2. Breast, left, needle core biopsy, 2 o'clock - FIBROADENOMA. -  THERE IS NO EVIDENCE OF MALIGNANCY. - SEE COMMENT.    03/29/2018 Imaging    03/29/2018 Bone Scan IMPRESSION: No scintigraphic evidence  of osseous metastatic disease.    03/30/2018 Imaging    Bone scan  IMPRESSION: No scintigraphic evidence of osseous metastatic disease.     04/07/2018 -  Chemotherapy    First line chemo weekly Taxol and herceptin/Perjeta every 3 weeks starting 04/07/18. She developed infusion reaction to taxol and it was discontinued. Added Abraxane on C1D8.     06/06/2018 Imaging    CT CAP IMPRESSION: 1. Generally improved appearance, with reduced axillary adenopathy and reduced enhancing component of the hepatic masses, with some of the hepatic mass is moderately smaller than on the prior exam. Reduced size of the right lateral breast mass compared to the prior 03/15/2018 exam. 2. New mild interstitial accentuation in the lungs, significance uncertain. Part of this appearance may be due to lower lung volumes on today's exam. 3. Mild wall thickening in the descending colon and upper rectum suggesting low-grade colitis/inflammation. Prominent stool throughout the colon favors constipation. 4. Other imaging findings of potential clinical significance: Aortic Atherosclerosis (ICD10-I70.0). Mild cardiomegaly. Mild nodularity in the right lower lobe appears stable. Contracted and thick-walled gallbladder.    CURRENT THERAPY: First line chemo weekly Taxol and Herceptin/Perjeta every 3 weeks starting 04/07/18. She developed infusion reaction to taxol and it was discontinued. Changed to Abraxane on C1D8.   INTERVAL HISTORY: Ms. Tosh returns for follow up and weekly abraxane as scheduled. She completed cycle 4 day 8 on 11/29. She feels well. Denies significant fatigue. Appetite is good. Diarrhea is improving. Has 1-2 episodes per day, no blood in stool. Takes lomotil if needed. She has eye tearing in the morning and some blood in nasal sputum. Denies eye redness or pain, vision change, fever, chills, cough, chest pain, dyspnea, neuropathy, or changes in her breasts. Nail beds are slightly darkened without pain,  separation, or drainage around the nail.    MEDICAL HISTORY:  Past Medical History:  Diagnosis Date  . Cancer ([place])   . GERD (gastroesophageal reflux disease)   . Hypertension     SURGICAL HISTORY: Past Surgical History:  Procedure Laterality Date  . COLONOSCOPY    . ESOPHAGOGASTRODUODENOSCOPY ENDOSCOPY    . IR IMAGING GUIDED PORT INSERTION  04/04/2018    I have reviewed the social history and family history with the patient and they are unchanged from previous note.  ALLERGIES:  has No Known Allergies.  MEDICATIONS:  Current Outpatient Medications  Medication Sig Dispense Refill  . amLODipine (NORVASC) 5 MG tablet Take 5 mg by mouth daily.     Marland Kitchen atenolol (TENORMIN) 50 MG tablet Take 50 mg by mouth every evening.     . diphenoxylate-atropine (LOMOTIL) 2.5-0.025 MG tablet Take 2 tablets by mouth 4 (four) times daily as needed for diarrhea or loose stools. 30 tablet 0  . lidocaine-prilocaine (EMLA) cream Apply to affected area once 30 g 3  . LORazepam (ATIVAN) 0.5 MG tablet Take 1 tablet (0.5 mg total) by mouth at bedtime as needed for anxiety. 30 tablet 0  . omeprazole (PRILOSEC) 20 MG capsule Take 1 capsule (20 mg total) by mouth daily. 30 capsule 1  . ondansetron (ZOFRAN) 8 MG tablet Take 1 tablet (8 mg total) by mouth 2 (two) times daily as needed (Nausea or vomiting). 30 tablet 1  . ondansetron (ZOFRAN) 8 MG tablet TAKE 1 TABLET BY MOUTH EVERY 8 HOURS AS NEEDED FOR NAUSEA OR  VOMITING 30 tablet 0  . potassium chloride SA (K-DUR,KLOR-CON) 20 MEQ tablet Take 1 tablet (20 mEq total) by mouth 2 (two) times daily. 60 tablet 1  . traMADol (ULTRAM) 50 MG tablet Take 1 tablet (50 mg total) by mouth every 6 (six) hours as needed for moderate pain or severe pain. 30 tablet 0  . dronabinol (MARINOL) 2.5 MG capsule Take 1 capsule twice daily before meals as needed 30 capsule 1  . mirtazapine (REMERON) 7.5 MG tablet Take 1 tablet (7.5 mg total) by mouth at bedtime. 30 tablet 0   No current  facility-administered medications for this visit.    Facility-Administered Medications Ordered in Other Visits  Medication Dose Route Frequency Provider Last Rate Last Dose  . heparin lock flush 100 unit/mL  500 Units Intracatheter Once PRN Nicholas Lose, MD      . ondansetron (ZOFRAN) tablet 8 mg  8 mg Oral Once [name], MD      . PACLitaxel-protein bound (ABRAXANE) chemo infusion 125 mg  80 mg/m2 (Treatment Plan Recorded) Intravenous Once Nicholas Lose, MD      . sodium chloride flush (NS) 0.9 % injection 10 mL  10 mL Intracatheter PRN Nicholas Lose, MD        PHYSICAL EXAMINATION: ECOG PERFORMANCE STATUS: 1 - Symptomatic but completely ambulatory  Vitals:   06/23/18 0906 06/23/18 0907  BP: (!) 137/96 (!) 149/88  Pulse: 79   Resp: 18   Temp: 98.1 F (36.7 C)   SpO2: 100%    Filed Weights   06/23/18 0906  Weight: 133 lb 4.8 oz (60.5 kg)    GENERAL:alert, no distress and comfortable SKIN: no rashes or significant lesions. Mild hyperpigmentation to nailbeds  EYES: sclera clear OROPHARYNX:no thrush or ulcers LYMPH:  no palpable cervical or supraclavicular lymphadenopathy LUNGS: clear to auscultation with normal breathing effort HEART: regular rate & rhythm, no lower extremity edema ABDOMEN:abdomen soft, non-tender and normal bowel sounds NEURO: alert & oriented x 3 with fluent speech, no focal motor/sensory deficits Breast exam deferred  PAC without erythema   LABORATORY DATA:  I have reviewed the data as listed CBC Latest Ref Rng & Units 06/23/2018 06/16/2018 06/09/2018  WBC 4.0 - 10.5 K/uL 4.3 3.7(L) 4.0  Hemoglobin 12.0 - 15.0 g/dL 9.2(L) 8.6(L) 9.3(L)  Hematocrit 36.0 - 46.0 % 28.2(L) 26.2(L) 27.9(L)  Platelets 150 - 400 K/uL 282 268 274     CMP Latest Ref Rng & Units 06/23/2018 06/16/2018 06/09/2018  Glucose 70 - 99 mg/dL 94 100(H) 101(H)  BUN 8 - 23 mg/dL 9 8 7(L)  Creatinine 0.44 - 1.00 mg/dL 0.97 0.83 0.92  Sodium 135 - 145 mmol/L 136 136 137  Potassium 3.5  - 5.1 mmol/L 3.9 3.6 3.1(L)  Chloride 98 - 111 mmol/L 105 104 102  CO2 22 - 32 mmol/L '22 24 27  '$ Calcium 8.9 - 10.3 mg/dL 9.2 9.5 9.0  Total Protein 6.5 - 8.1 g/dL 6.8 6.5 6.8  Total Bilirubin 0.3 - 1.2 mg/dL 0.4 0.5 0.5  Alkaline Phos 38 - 126 U/L 102 93 94  AST 15 - 41 U/L 28 30 32  ALT 0 - 44 U/L '16 16 17      '$ RADIOGRAPHIC STUDIES: I have personally reviewed the radiological images as listed and agreed with the findings in the report. No results found.   ASSESSMENT & PLAN: Shaylee Stanislawski Whitfieldis a 62 y.o.femalewith history of HTN, GERD  1. Metastatic right breast cancer to liver, cT2N1M1, stage IV, ER+/PR+/HER2+, Liver mets  ER+/PR+/HER2- -Ms. Goggins appears stable. She completed cycle 4 day 8 weekly abraxane and continues q3 week herceptin and perjeta. She continues to tolerate chemotherapy very well overall, except mild diarrhea. She manages well with lomotil.  -Her right breast mass is previously not palpable, clinically she is responding well to treatment -She underwent restaging CT CAP on 11/19 after approximately 2 months of therapy which showed good response to therapy; this was previously reviewed with her at last visit -Dr. Burr Medico recommends to continue current treatment plan with weekly abraxane and q3 week herceptin/perjeta for 3-4 more months, to total approx 6 months of chemo. We will restage in 2-3 more months to monitor her response to therapy. If she continues to have a good response, will plan to continue with maintenance herceptin/perjeta. The patient agrees with the plan.  -She will have 1 week treatment breast at Christmas.  -Labs reviewed, adequate to proceed with cycle 4 day 15 abraxane today, next herceptin, and perjeta due with chemo next week  -f/u in 2 weeks -she will have cardiology appt and echo this month with Dr. Haroldine Laws   2. Upper abdominal and back pain  -currently denies pain   3. Anorexia, Nausea, Diarrhea and weight loss  -her appetite and  weight are stable off marinol and mirtazapine. She eats best early in the day.  -she has persistent but mild diarrhea, improved overall now 1-2 episodes per day.  -controlled with lomotil PRN -CT from 11/19 shows mild wall thickening in the descending colon and upper rectum, suggesting low grade colitis/inflammation. -will monitor   4. Social support  -she lives alone, has been referred to SW; her daughter is with her for appts -previously prescribed ativan PRN for anxiety r/t to diagnosis and treatment.   5. Goal of care discussion  -she is full code   6. Transaminitis -Secondary to her recent liver metastasis. -Overall has improved some since she started chemotherapy -resolved; hepatomegaly has also improved   7. Anemia -Abrupt anemia since she started chemotherapy, she also reports episode of melena which spontaneously resolved. -Dr. Burr Medico previously prescribed her Prilosec on 04/28/18 -Hgb stable, 9.2; no blood transfusion needed   8. Hypokalemia -previously on oral K once daily, she continues to have diarrhea. Imodium is not helping.  -diarrhea improved with lomotil, K normal on BID dosing, she will continue  -continue monitoring   9. Elevated Creatinine  -resolved. Cr normal lately   Plan: -Labs reviewed -Proceed with cycle 4 day 15 abraxane at current dose  -return in 1 week for cycle 5 day 1 weekly abraxane, and herceptin/perjeta (given q3 weeks) -Treatment break during week of Christmas  -Dr. Haroldine Laws visit this month  -F/u in 2 weeks  -Refilled omeprazole   All questions were answered. The patient knows to call the clinic with any problems, questions or concerns. No barriers to learning was detected.     Alla Feeling, NP 06/23/18

## 2018-06-23 ENCOUNTER — Telehealth: Payer: Self-pay

## 2018-06-23 ENCOUNTER — Ambulatory Visit: Payer: 59

## 2018-06-23 ENCOUNTER — Other Ambulatory Visit: Payer: 59

## 2018-06-23 ENCOUNTER — Encounter: Payer: Self-pay | Admitting: Nurse Practitioner

## 2018-06-23 ENCOUNTER — Inpatient Hospital Stay: Payer: 59

## 2018-06-23 ENCOUNTER — Inpatient Hospital Stay (HOSPITAL_BASED_OUTPATIENT_CLINIC_OR_DEPARTMENT_OTHER): Payer: 59 | Admitting: Nurse Practitioner

## 2018-06-23 ENCOUNTER — Inpatient Hospital Stay: Payer: 59 | Attending: Hematology

## 2018-06-23 ENCOUNTER — Ambulatory Visit: Payer: 59 | Admitting: Hematology

## 2018-06-23 VITALS — BP 149/88 | HR 79 | Temp 98.1°F | Resp 18 | Ht 64.0 in | Wt 133.3 lb

## 2018-06-23 DIAGNOSIS — K219 Gastro-esophageal reflux disease without esophagitis: Secondary | ICD-10-CM | POA: Diagnosis not present

## 2018-06-23 DIAGNOSIS — R531 Weakness: Secondary | ICD-10-CM | POA: Diagnosis not present

## 2018-06-23 DIAGNOSIS — R197 Diarrhea, unspecified: Secondary | ICD-10-CM

## 2018-06-23 DIAGNOSIS — Z170 Estrogen receptor positive status [ER+]: Secondary | ICD-10-CM

## 2018-06-23 DIAGNOSIS — R21 Rash and other nonspecific skin eruption: Secondary | ICD-10-CM | POA: Diagnosis not present

## 2018-06-23 DIAGNOSIS — C787 Secondary malignant neoplasm of liver and intrahepatic bile duct: Secondary | ICD-10-CM | POA: Diagnosis not present

## 2018-06-23 DIAGNOSIS — I10 Essential (primary) hypertension: Secondary | ICD-10-CM | POA: Insufficient documentation

## 2018-06-23 DIAGNOSIS — Z5112 Encounter for antineoplastic immunotherapy: Secondary | ICD-10-CM | POA: Diagnosis present

## 2018-06-23 DIAGNOSIS — Z5111 Encounter for antineoplastic chemotherapy: Secondary | ICD-10-CM | POA: Diagnosis not present

## 2018-06-23 DIAGNOSIS — C50919 Malignant neoplasm of unspecified site of unspecified female breast: Secondary | ICD-10-CM

## 2018-06-23 DIAGNOSIS — Z95828 Presence of other vascular implants and grafts: Secondary | ICD-10-CM

## 2018-06-23 DIAGNOSIS — N6320 Unspecified lump in the left breast, unspecified quadrant: Secondary | ICD-10-CM | POA: Diagnosis not present

## 2018-06-23 DIAGNOSIS — C50411 Malignant neoplasm of upper-outer quadrant of right female breast: Secondary | ICD-10-CM | POA: Diagnosis not present

## 2018-06-23 DIAGNOSIS — Z79899 Other long term (current) drug therapy: Secondary | ICD-10-CM | POA: Diagnosis not present

## 2018-06-23 DIAGNOSIS — D649 Anemia, unspecified: Secondary | ICD-10-CM | POA: Insufficient documentation

## 2018-06-23 DIAGNOSIS — R740 Nonspecific elevation of levels of transaminase and lactic acid dehydrogenase [LDH]: Secondary | ICD-10-CM

## 2018-06-23 DIAGNOSIS — E876 Hypokalemia: Secondary | ICD-10-CM | POA: Insufficient documentation

## 2018-06-23 DIAGNOSIS — Z7189 Other specified counseling: Secondary | ICD-10-CM

## 2018-06-23 LAB — CMP (CANCER CENTER ONLY)
ALT: 16 U/L (ref 0–44)
AST: 28 U/L (ref 15–41)
Albumin: 3.7 g/dL (ref 3.5–5.0)
Alkaline Phosphatase: 102 U/L (ref 38–126)
Anion gap: 9 (ref 5–15)
BUN: 9 mg/dL (ref 8–23)
CO2: 22 mmol/L (ref 22–32)
Calcium: 9.2 mg/dL (ref 8.9–10.3)
Chloride: 105 mmol/L (ref 98–111)
Creatinine: 0.97 mg/dL (ref 0.44–1.00)
GFR, Est AFR Am: >60 mL/min (ref >60)
GFR, Estimated: >60 mL/min (ref >60)
Glucose, Bld: 94 mg/dL (ref 70–99)
Potassium: 3.9 mmol/L (ref 3.5–5.1)
Sodium: 136 mmol/L (ref 135–145)
Total Bilirubin: 0.4 mg/dL (ref 0.3–1.2)
Total Protein: 6.8 g/dL (ref 6.5–8.1)

## 2018-06-23 LAB — CBC WITH DIFFERENTIAL (CANCER CENTER ONLY)
Abs Immature Granulocytes: 0.02 10*3/uL (ref 0.00–0.07)
Basophils Absolute: 0.1 10*3/uL (ref 0.0–0.1)
Basophils Relative: 1 %
Eosinophils Absolute: 0.1 10*3/uL (ref 0.0–0.5)
Eosinophils Relative: 1 %
HCT: 28.2 % — ABNORMAL LOW (ref 36.0–46.0)
Hemoglobin: 9.2 g/dL — ABNORMAL LOW (ref 12.0–15.0)
Immature Granulocytes: 1 %
Lymphocytes Relative: 42 %
Lymphs Abs: 1.8 10*3/uL (ref 0.7–4.0)
MCH: 28.8 pg (ref 26.0–34.0)
MCHC: 32.6 g/dL (ref 30.0–36.0)
MCV: 88.1 fL (ref 80.0–100.0)
Monocytes Absolute: 0.3 10*3/uL (ref 0.1–1.0)
Monocytes Relative: 7 %
Neutro Abs: 2.1 10*3/uL (ref 1.7–7.7)
Neutrophils Relative %: 48 %
Platelet Count: 282 10*3/uL (ref 150–400)
RBC: 3.2 MIL/uL — ABNORMAL LOW (ref 3.87–5.11)
RDW: 18.4 % — ABNORMAL HIGH (ref 11.5–15.5)
WBC Count: 4.3 10*3/uL (ref 4.0–10.5)
nRBC: 0 % (ref 0.0–0.2)

## 2018-06-23 MED ORDER — PROCHLORPERAZINE MALEATE 10 MG PO TABS
10.0000 mg | ORAL_TABLET | Freq: Once | ORAL | Status: AC
  Administered 2018-06-23: 10 mg via ORAL

## 2018-06-23 MED ORDER — SODIUM CHLORIDE 0.9 % IV SOLN
Freq: Once | INTRAVENOUS | Status: AC
  Administered 2018-06-23: 10:00:00 via INTRAVENOUS
  Filled 2018-06-23: qty 250

## 2018-06-23 MED ORDER — SODIUM CHLORIDE 0.9% FLUSH
10.0000 mL | INTRAVENOUS | Status: DC | PRN
  Administered 2018-06-23: 10 mL
  Filled 2018-06-23: qty 10

## 2018-06-23 MED ORDER — PACLITAXEL PROTEIN-BOUND CHEMO INJECTION 100 MG
80.0000 mg/m2 | Freq: Once | INTRAVENOUS | Status: AC
  Administered 2018-06-23: 125 mg via INTRAVENOUS
  Filled 2018-06-23: qty 25

## 2018-06-23 MED ORDER — HEPARIN SOD (PORK) LOCK FLUSH 100 UNIT/ML IV SOLN
500.0000 [IU] | Freq: Once | INTRAVENOUS | Status: AC | PRN
  Administered 2018-06-23: 500 [IU]
  Filled 2018-06-23: qty 5

## 2018-06-23 MED ORDER — PROCHLORPERAZINE MALEATE 10 MG PO TABS
ORAL_TABLET | ORAL | Status: AC
  Filled 2018-06-23: qty 1

## 2018-06-23 MED ORDER — ONDANSETRON HCL 8 MG PO TABS: 8.0000 mg | ORAL_TABLET | Freq: Once | ORAL | Status: DC

## 2018-06-23 MED ORDER — OMEPRAZOLE 20 MG PO CPDR: 20.0000 mg | DELAYED_RELEASE_CAPSULE | Freq: Every day | ORAL | 1 refills | Status: DC

## 2018-06-23 MED ORDER — ONDANSETRON HCL 8 MG PO TABS
ORAL_TABLET | ORAL | Status: AC
  Filled 2018-06-23: qty 1

## 2018-06-23 NOTE — Telephone Encounter (Signed)
Printed avs and calender of upcoming appointment. Per 12/6 los 

## 2018-06-23 NOTE — Patient Instructions (Signed)
[facility] Discharge Instructions for Patients Receiving Chemotherapy  Today you received the following chemotherapy agents:  Abraxane.  To help prevent nausea and vomiting after your treatment, we encourage you to take your nausea medication as directed.   If you develop nausea and vomiting that is not controlled by your nausea medication, call the clinic.   BELOW ARE SYMPTOMS THAT SHOULD BE REPORTED IMMEDIATELY:  *FEVER GREATER THAN 100.5 F  *CHILLS WITH OR WITHOUT FEVER  NAUSEA AND VOMITING THAT IS NOT CONTROLLED WITH YOUR NAUSEA MEDICATION  *UNUSUAL SHORTNESS OF BREATH  *UNUSUAL BRUISING OR BLEEDING  TENDERNESS IN MOUTH AND THROAT WITH OR WITHOUT PRESENCE OF ULCERS  *URINARY PROBLEMS  *BOWEL PROBLEMS  UNUSUAL RASH Items with * indicate a potential emergency and should be followed up as soon as possible.  Feel free to call the clinic should you have any questions or concerns. The clinic phone number is [phone].  Please show the CHEMO ALERT CARD at check-in to the Emergency Department and triage nurse.   

## 2018-06-28 ENCOUNTER — Telehealth: Payer: Self-pay | Admitting: *Deleted

## 2018-06-28 ENCOUNTER — Other Ambulatory Visit: Payer: Self-pay | Admitting: Hematology

## 2018-06-28 NOTE — Telephone Encounter (Signed)
Medical records faxed to [place]; RID [phone]

## 2018-06-30 ENCOUNTER — Inpatient Hospital Stay: Payer: 59

## 2018-06-30 ENCOUNTER — Other Ambulatory Visit: Payer: 59

## 2018-06-30 ENCOUNTER — Ambulatory Visit: Payer: 59 | Admitting: Hematology

## 2018-06-30 VITALS — BP 132/89 | HR 73 | Temp 98.1°F | Resp 17 | Ht 64.0 in | Wt 135.5 lb

## 2018-06-30 DIAGNOSIS — Z7189 Other specified counseling: Secondary | ICD-10-CM

## 2018-06-30 DIAGNOSIS — R197 Diarrhea, unspecified: Secondary | ICD-10-CM | POA: Diagnosis not present

## 2018-06-30 DIAGNOSIS — Z170 Estrogen receptor positive status [ER+]: Secondary | ICD-10-CM | POA: Diagnosis not present

## 2018-06-30 DIAGNOSIS — C50919 Malignant neoplasm of unspecified site of unspecified female breast: Secondary | ICD-10-CM

## 2018-06-30 DIAGNOSIS — C787 Secondary malignant neoplasm of liver and intrahepatic bile duct: Secondary | ICD-10-CM | POA: Diagnosis not present

## 2018-06-30 DIAGNOSIS — Z5111 Encounter for antineoplastic chemotherapy: Secondary | ICD-10-CM | POA: Diagnosis not present

## 2018-06-30 DIAGNOSIS — I10 Essential (primary) hypertension: Secondary | ICD-10-CM | POA: Diagnosis not present

## 2018-06-30 DIAGNOSIS — N6320 Unspecified lump in the left breast, unspecified quadrant: Secondary | ICD-10-CM | POA: Diagnosis not present

## 2018-06-30 DIAGNOSIS — Z95828 Presence of other vascular implants and grafts: Secondary | ICD-10-CM

## 2018-06-30 DIAGNOSIS — R21 Rash and other nonspecific skin eruption: Secondary | ICD-10-CM | POA: Diagnosis not present

## 2018-06-30 DIAGNOSIS — R531 Weakness: Secondary | ICD-10-CM | POA: Diagnosis not present

## 2018-06-30 DIAGNOSIS — C50411 Malignant neoplasm of upper-outer quadrant of right female breast: Secondary | ICD-10-CM | POA: Diagnosis not present

## 2018-06-30 LAB — CMP (CANCER CENTER ONLY)
ALT: 12 U/L (ref 0–44)
AST: 25 U/L (ref 15–41)
Albumin: 3.5 g/dL (ref 3.5–5.0)
Alkaline Phosphatase: 84 U/L (ref 38–126)
Anion gap: 9 (ref 5–15)
BUN: 6 mg/dL — ABNORMAL LOW (ref 8–23)
CO2: 23 mmol/L (ref 22–32)
Calcium: 8.8 mg/dL — ABNORMAL LOW (ref 8.9–10.3)
Chloride: 107 mmol/L (ref 98–111)
Creatinine: 0.9 mg/dL (ref 0.44–1.00)
GFR, Est AFR Am: >60 mL/min (ref >60)
GFR, Estimated: >60 mL/min (ref >60)
Glucose, Bld: 93 mg/dL (ref 70–99)
Potassium: 3.9 mmol/L (ref 3.5–5.1)
Sodium: 139 mmol/L (ref 135–145)
Total Bilirubin: 0.3 mg/dL (ref 0.3–1.2)
Total Protein: 6.2 g/dL — ABNORMAL LOW (ref 6.5–8.1)

## 2018-06-30 LAB — CBC WITH DIFFERENTIAL (CANCER CENTER ONLY)
Abs Immature Granulocytes: 0.01 10*3/uL (ref 0.00–0.07)
Basophils Absolute: 0 10*3/uL (ref 0.0–0.1)
Basophils Relative: 1 %
Eosinophils Absolute: 0.1 10*3/uL (ref 0.0–0.5)
Eosinophils Relative: 1 %
HCT: 26.4 % — ABNORMAL LOW (ref 36.0–46.0)
Hemoglobin: 8.5 g/dL — ABNORMAL LOW (ref 12.0–15.0)
Immature Granulocytes: 0 %
Lymphocytes Relative: 45 %
Lymphs Abs: 1.8 10*3/uL (ref 0.7–4.0)
MCH: 29 pg (ref 26.0–34.0)
MCHC: 32.2 g/dL (ref 30.0–36.0)
MCV: 90.1 fL (ref 80.0–100.0)
Monocytes Absolute: 0.2 10*3/uL (ref 0.1–1.0)
Monocytes Relative: 5 %
Neutro Abs: 1.8 10*3/uL (ref 1.7–7.7)
Neutrophils Relative %: 48 %
Platelet Count: 290 10*3/uL (ref 150–400)
RBC: 2.93 MIL/uL — ABNORMAL LOW (ref 3.87–5.11)
RDW: 19.1 % — ABNORMAL HIGH (ref 11.5–15.5)
WBC Count: 3.9 10*3/uL — ABNORMAL LOW (ref 4.0–10.5)
nRBC: 0.8 % — ABNORMAL HIGH (ref 0.0–0.2)

## 2018-06-30 MED ORDER — TRASTUZUMAB CHEMO 150 MG IV SOLR
6.0000 mg/kg | Freq: Once | INTRAVENOUS | Status: AC
  Administered 2018-06-30: 399 mg via INTRAVENOUS
  Filled 2018-06-30: qty 19

## 2018-06-30 MED ORDER — PACLITAXEL PROTEIN-BOUND CHEMO INJECTION 100 MG
80.0000 mg/m2 | Freq: Once | INTRAVENOUS | Status: AC
  Administered 2018-06-30: 125 mg via INTRAVENOUS
  Filled 2018-06-30: qty 25

## 2018-06-30 MED ORDER — SODIUM CHLORIDE 0.9% FLUSH
10.0000 mL | INTRAVENOUS | Status: DC | PRN
  Administered 2018-06-30: 10 mL
  Filled 2018-06-30: qty 10

## 2018-06-30 MED ORDER — DIPHENHYDRAMINE HCL 50 MG/ML IJ SOLN
INTRAMUSCULAR | Status: AC
  Filled 2018-06-30: qty 1

## 2018-06-30 MED ORDER — ACETAMINOPHEN 325 MG PO TABS
650.0000 mg | ORAL_TABLET | Freq: Once | ORAL | Status: AC
  Administered 2018-06-30: 650 mg via ORAL

## 2018-06-30 MED ORDER — SODIUM CHLORIDE 0.9 % IV SOLN
Freq: Once | INTRAVENOUS | Status: AC
  Administered 2018-06-30: 12:00:00 via INTRAVENOUS
  Filled 2018-06-30: qty 250

## 2018-06-30 MED ORDER — ACETAMINOPHEN 325 MG PO TABS
ORAL_TABLET | ORAL | Status: AC
  Filled 2018-06-30: qty 1

## 2018-06-30 MED ORDER — DIPHENHYDRAMINE HCL 50 MG/ML IJ SOLN
50.0000 mg | Freq: Once | INTRAMUSCULAR | Status: AC
  Administered 2018-06-30: 50 mg via INTRAVENOUS

## 2018-06-30 MED ORDER — PROCHLORPERAZINE MALEATE 10 MG PO TABS
10.0000 mg | ORAL_TABLET | Freq: Once | ORAL | Status: AC
  Administered 2018-06-30: 10 mg via ORAL

## 2018-06-30 MED ORDER — SODIUM CHLORIDE 0.9 % IV SOLN
420.0000 mg | Freq: Once | INTRAVENOUS | Status: AC
  Administered 2018-06-30: 420 mg via INTRAVENOUS
  Filled 2018-06-30: qty 14

## 2018-06-30 MED ORDER — ONDANSETRON HCL 8 MG PO TABS: 8.0000 mg | ORAL_TABLET | Freq: Once | ORAL | Status: DC

## 2018-06-30 MED ORDER — PROCHLORPERAZINE MALEATE 10 MG PO TABS
ORAL_TABLET | ORAL | Status: AC
  Filled 2018-06-30: qty 1

## 2018-06-30 MED ORDER — HEPARIN SOD (PORK) LOCK FLUSH 100 UNIT/ML IV SOLN
500.0000 [IU] | Freq: Once | INTRAVENOUS | Status: AC | PRN
  Administered 2018-06-30: 500 [IU]
  Filled 2018-06-30: qty 5

## 2018-06-30 NOTE — Patient Instructions (Signed)
[facility] Discharge Instructions for Patients Receiving Chemotherapy  Today you received the following chemotherapy agents: Trastuzumab (Herceptin), Pertuzumab (Perjeta), and Paclitaxel protein-bound (Abraxane)  To help prevent nausea and vomiting after your treatment, we encourage you to take your nausea medication as directed.    If you develop nausea and vomiting that is not controlled by your nausea medication, call the clinic.   BELOW ARE SYMPTOMS THAT SHOULD BE REPORTED IMMEDIATELY:  *FEVER GREATER THAN 100.5 F  *CHILLS WITH OR WITHOUT FEVER  NAUSEA AND VOMITING THAT IS NOT CONTROLLED WITH YOUR NAUSEA MEDICATION  *UNUSUAL SHORTNESS OF BREATH  *UNUSUAL BRUISING OR BLEEDING  TENDERNESS IN MOUTH AND THROAT WITH OR WITHOUT PRESENCE OF ULCERS  *URINARY PROBLEMS  *BOWEL PROBLEMS  UNUSUAL RASH Items with * indicate a potential emergency and should be followed up as soon as possible.  Feel free to call the clinic should you have any questions or concerns. The clinic phone number is [phone].  Please show the [place] at check-in to the Emergency Department and triage nurse.

## 2018-06-30 NOTE — Patient Instructions (Signed)

## 2018-07-04 ENCOUNTER — Telehealth: Payer: Self-pay | Admitting: Hematology

## 2018-07-04 NOTE — Telephone Encounter (Signed)
Spoke with patient and set her up with the program - scheduled rides for 1/2 and 1/3. Will have her sign waiver on 1/3

## 2018-07-07 ENCOUNTER — Inpatient Hospital Stay: Payer: 59

## 2018-07-07 ENCOUNTER — Telehealth: Payer: Self-pay

## 2018-07-07 ENCOUNTER — Inpatient Hospital Stay (HOSPITAL_BASED_OUTPATIENT_CLINIC_OR_DEPARTMENT_OTHER): Payer: 59 | Admitting: Hematology

## 2018-07-07 ENCOUNTER — Encounter: Payer: Self-pay | Admitting: Hematology

## 2018-07-07 VITALS — BP 143/86 | HR 69 | Temp 98.1°F | Resp 18 | Ht 64.0 in | Wt 133.8 lb

## 2018-07-07 DIAGNOSIS — R197 Diarrhea, unspecified: Secondary | ICD-10-CM | POA: Diagnosis not present

## 2018-07-07 DIAGNOSIS — R21 Rash and other nonspecific skin eruption: Secondary | ICD-10-CM

## 2018-07-07 DIAGNOSIS — K219 Gastro-esophageal reflux disease without esophagitis: Secondary | ICD-10-CM

## 2018-07-07 DIAGNOSIS — I10 Essential (primary) hypertension: Secondary | ICD-10-CM | POA: Diagnosis not present

## 2018-07-07 DIAGNOSIS — R531 Weakness: Secondary | ICD-10-CM | POA: Diagnosis not present

## 2018-07-07 DIAGNOSIS — R740 Nonspecific elevation of levels of transaminase and lactic acid dehydrogenase [LDH]: Secondary | ICD-10-CM

## 2018-07-07 DIAGNOSIS — Z170 Estrogen receptor positive status [ER+]: Secondary | ICD-10-CM | POA: Diagnosis not present

## 2018-07-07 DIAGNOSIS — Z7189 Other specified counseling: Secondary | ICD-10-CM

## 2018-07-07 DIAGNOSIS — N6320 Unspecified lump in the left breast, unspecified quadrant: Secondary | ICD-10-CM | POA: Diagnosis not present

## 2018-07-07 DIAGNOSIS — E876 Hypokalemia: Secondary | ICD-10-CM

## 2018-07-07 DIAGNOSIS — C787 Secondary malignant neoplasm of liver and intrahepatic bile duct: Secondary | ICD-10-CM

## 2018-07-07 DIAGNOSIS — C50919 Malignant neoplasm of unspecified site of unspecified female breast: Secondary | ICD-10-CM

## 2018-07-07 DIAGNOSIS — C50411 Malignant neoplasm of upper-outer quadrant of right female breast: Secondary | ICD-10-CM | POA: Diagnosis not present

## 2018-07-07 DIAGNOSIS — D649 Anemia, unspecified: Secondary | ICD-10-CM

## 2018-07-07 DIAGNOSIS — Z95828 Presence of other vascular implants and grafts: Secondary | ICD-10-CM

## 2018-07-07 DIAGNOSIS — Z79899 Other long term (current) drug therapy: Secondary | ICD-10-CM

## 2018-07-07 DIAGNOSIS — Z5111 Encounter for antineoplastic chemotherapy: Secondary | ICD-10-CM | POA: Diagnosis not present

## 2018-07-07 LAB — CMP (CANCER CENTER ONLY)
ALT: 14 U/L (ref 0–44)
AST: 26 U/L (ref 15–41)
Albumin: 3.7 g/dL (ref 3.5–5.0)
Alkaline Phosphatase: 89 U/L (ref 38–126)
Anion gap: 8 (ref 5–15)
BUN: 7 mg/dL — ABNORMAL LOW (ref 8–23)
CO2: 23 mmol/L (ref 22–32)
Calcium: 9.1 mg/dL (ref 8.9–10.3)
Chloride: 107 mmol/L (ref 98–111)
Creatinine: 0.94 mg/dL (ref 0.44–1.00)
GFR, Est AFR Am: >60 mL/min (ref >60)
GFR, Estimated: >60 mL/min (ref >60)
Glucose, Bld: 123 mg/dL — ABNORMAL HIGH (ref 70–99)
Potassium: 3.8 mmol/L (ref 3.5–5.1)
Sodium: 138 mmol/L (ref 135–145)
Total Bilirubin: 0.4 mg/dL (ref 0.3–1.2)
Total Protein: 6.6 g/dL (ref 6.5–8.1)

## 2018-07-07 LAB — CBC WITH DIFFERENTIAL (CANCER CENTER ONLY)
Abs Immature Granulocytes: 0.02 10*3/uL (ref 0.00–0.07)
Basophils Absolute: 0 10*3/uL (ref 0.0–0.1)
Basophils Relative: 1 %
Eosinophils Absolute: 0.1 10*3/uL (ref 0.0–0.5)
Eosinophils Relative: 2 %
HCT: 28.4 % — ABNORMAL LOW (ref 36.0–46.0)
Hemoglobin: 9.1 g/dL — ABNORMAL LOW (ref 12.0–15.0)
Immature Granulocytes: 1 %
Lymphocytes Relative: 46 %
Lymphs Abs: 1.9 10*3/uL (ref 0.7–4.0)
MCH: 29.6 pg (ref 26.0–34.0)
MCHC: 32 g/dL (ref 30.0–36.0)
MCV: 92.5 fL (ref 80.0–100.0)
Monocytes Absolute: 0.2 10*3/uL (ref 0.1–1.0)
Monocytes Relative: 4 %
Neutro Abs: 1.9 10*3/uL (ref 1.7–7.7)
Neutrophils Relative %: 46 %
Platelet Count: 275 10*3/uL (ref 150–400)
RBC: 3.07 MIL/uL — ABNORMAL LOW (ref 3.87–5.11)
RDW: 18.7 % — ABNORMAL HIGH (ref 11.5–15.5)
WBC Count: 4.1 10*3/uL (ref 4.0–10.5)
nRBC: 0.7 % — ABNORMAL HIGH (ref 0.0–0.2)

## 2018-07-07 MED ORDER — PACLITAXEL PROTEIN-BOUND CHEMO INJECTION 100 MG
80.0000 mg/m2 | Freq: Once | INTRAVENOUS | Status: AC
  Administered 2018-07-07: 125 mg via INTRAVENOUS
  Filled 2018-07-07: qty 25

## 2018-07-07 MED ORDER — SODIUM CHLORIDE 0.9% FLUSH
10.0000 mL | INTRAVENOUS | Status: DC | PRN
  Administered 2018-07-07: 10 mL
  Filled 2018-07-07: qty 10

## 2018-07-07 MED ORDER — ONDANSETRON HCL 8 MG PO TABS
ORAL_TABLET | ORAL | Status: AC
  Filled 2018-07-07: qty 1

## 2018-07-07 MED ORDER — HEPARIN SOD (PORK) LOCK FLUSH 100 UNIT/ML IV SOLN
500.0000 [IU] | Freq: Once | INTRAVENOUS | Status: AC | PRN
  Administered 2018-07-07: 500 [IU]
  Filled 2018-07-07: qty 5

## 2018-07-07 MED ORDER — ONDANSETRON HCL 8 MG PO TABS: 8.0000 mg | ORAL_TABLET | Freq: Once | ORAL | Status: DC

## 2018-07-07 MED ORDER — PROCHLORPERAZINE MALEATE 10 MG PO TABS
10.0000 mg | ORAL_TABLET | Freq: Once | ORAL | Status: AC
  Administered 2018-07-07: 10 mg via ORAL

## 2018-07-07 MED ORDER — SODIUM CHLORIDE 0.9 % IV SOLN
Freq: Once | INTRAVENOUS | Status: AC
  Administered 2018-07-07: 11:00:00 via INTRAVENOUS
  Filled 2018-07-07: qty 250

## 2018-07-07 MED ORDER — PROCHLORPERAZINE MALEATE 10 MG PO TABS
ORAL_TABLET | ORAL | Status: AC
  Filled 2018-07-07: qty 1

## 2018-07-07 NOTE — Patient Instructions (Signed)
[facility] Discharge Instructions for Patients Receiving Chemotherapy  Today you received the following chemotherapy agents:  Abraxane.  To help prevent nausea and vomiting after your treatment, we encourage you to take your nausea medication as directed.   If you develop nausea and vomiting that is not controlled by your nausea medication, call the clinic.   BELOW ARE SYMPTOMS THAT SHOULD BE REPORTED IMMEDIATELY:  *FEVER GREATER THAN 100.5 F  *CHILLS WITH OR WITHOUT FEVER  NAUSEA AND VOMITING THAT IS NOT CONTROLLED WITH YOUR NAUSEA MEDICATION  *UNUSUAL SHORTNESS OF BREATH  *UNUSUAL BRUISING OR BLEEDING  TENDERNESS IN MOUTH AND THROAT WITH OR WITHOUT PRESENCE OF ULCERS  *URINARY PROBLEMS  *BOWEL PROBLEMS  UNUSUAL RASH Items with * indicate a potential emergency and should be followed up as soon as possible.  Feel free to call the clinic should you have any questions or concerns. The clinic phone number is [phone].  Please show the CHEMO ALERT CARD at check-in to the Emergency Department and triage nurse.   

## 2018-07-07 NOTE — Progress Notes (Signed)
[name]   [phone] Fax:(336) [phone]   Clinic Follow up Note   Patient Care Team: Levin Bacon, NP as PCP - General (Family Medicine) [name], MD as Consulting Physician (Gastroenterology) [name], MD as Consulting Physician (Hematology)  Date of Service:  07/07/2018  CHIEF COMPLAINT: F/u for metastatic breast cancer  SUMMARY OF ONCOLOGIC HISTORY: Oncology History   Cancer Staging Metastatic breast cancer [facility]) Staging form: Breast, AJCC 8th Edition - Clinical stage from 03/24/2018: Stage IV (cT2, cN1, pM1, G3, ER+, PR+, HER2+) - Signed by [name], MD on 03/30/2018       Metastatic breast cancer ([place])   01/30/2018 Procedure    Colonoscopy showed small polyp in the sigmoid colon, removed, the exam of colon including the terminal ileum was otherwise negative.    01/30/2018 Procedure    EGD by Dr. Collene Mares showed small hiatal hernia, a 8 mm polypoid lesion in the cardia, biopsied.    03/09/2018 Imaging    03/09/2018 US Abdomen IMPRESSION: 1. Mass lesions throughout the liver, consistent with metastatic disease. Liver as a somewhat nodular contour suggesting underlying hepatic cirrhosis. Inhomogeneous echotexture to the liver.  2. Cholelithiasis with mild gallbladder wall thickening. A degree of cholecystitis cannot be excluded by ultrasound.  3. Portions of pancreas obscured by gas. Visualized portions of pancreas appear normal.  4. Small right kidney. Etiology uncertain. This finding potentially may be indicative of renal artery stenosis. In this regard, question whether patient is hypertensive.    03/15/2018 Imaging    CT CAP with contrast  IMPRESSION: 1. Widespread hepatic metastasis. 2. 2.6 cm lateral right breast soft tissue nodule could represent a breast primary or an incidental benign lesion. Consider correlation with mammogram and ultrasound. 3. No definite source of primary malignancy identified within the abdomen or pelvis.  There is possible rectosigmoid junction wall thickening. Consider colonoscopy with attention to this area. 4. Distal esophageal wall thickening, suggesting esophagitis.    03/24/2018 Cancer Staging    Staging form: Breast, AJCC 8th Edition - Clinical stage from 03/24/2018: Stage IV (cT2, cN1, pM1, G3, ER+, PR+, HER2+) - Signed by [name], MD on 03/30/2018    03/24/2018 Initial Biopsy    Diagnosis 1. 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 Breast, right, needle core biopsy, 9 o'clock, 7cm from nipple - INVASIVE DUCTAL CARCINOMA. -The carcinoma is somewhat morphologically dissimilar from that in part 1. It appears grade III 3. Lymph node, needle/core biopsy, right axillary - METASTATIC CARCINOMA IN 1 OF 1 LYMPH NODE (1/1).    03/24/2018 Receptors her2    Breast biopsy: 1. Estrogen Receptor: 40%, POSITIVE, STRONG-MODERATE STAINING INTENSITY Progesterone Receptor: 70%, POSITIVE, STRONG STAINING INTENSITY Proliferation Marker Ki67: 20% HER 2 equivocal by IHC 2+, POSITIVE by FISH, ratio 2.4 and copy #4.2  2. Estrogen Receptor: 60%, POSITIVE, MODERATE STAINING INTENSITY Progesterone Receptor: 40%, POSITIVE, MODERATE STAINING INTENSITY Proliferation Marker Ki67: 20% HER2 (+) by IHC 3+    03/24/2018 Initial Diagnosis    Metastatic breast cancer ([place])    03/27/2018 Pathology Results    Diagnosis Liver, needle/core biopsy, Right - METASTATIC CARCINOMA TO LIVER, CONSISTENT WITH PATIENTS CLINICAL HISTORY OF PRIMARY BREAST CARCINOMA.  ER 80%+ PR40%+ HER2- (by [facility], IHC 2+)  Ki67 50%     03/28/2018 Pathology Results    03/28/2018 Surgical Pathology Diagnosis 1. Breast, left, needle core biopsy, 9 o'clock - FIBROCYSTIC CHANGES. - THERE  IS NO EVIDENCE OF MALIGNANCY. 2. Breast, left, needle core biopsy, 2 o'clock - FIBROADENOMA. - THERE IS NO EVIDENCE OF MALIGNANCY. - SEE COMMENT.    03/29/2018  Imaging    03/29/2018 Bone Scan IMPRESSION: No scintigraphic evidence of osseous metastatic disease.    03/30/2018 Imaging    Bone scan  IMPRESSION: No scintigraphic evidence of osseous metastatic disease.     04/07/2018 -  Chemotherapy    First line chemo weekly Taxol and herceptin/Perjeta every 3 weeks starting 04/07/18. She developed infusion reaction to taxol and it was discontinued. Added Abraxane on C1D8.     06/06/2018 Imaging    CT CAP IMPRESSION: 1. Generally improved appearance, with reduced axillary adenopathy and reduced enhancing component of the hepatic masses, with some of the hepatic mass is moderately smaller than on the prior exam. Reduced size of the right lateral breast mass compared to the prior 03/15/2018 exam. 2. New mild interstitial accentuation in the lungs, significance uncertain. Part of this appearance may be due to lower lung volumes on today's exam. 3. Mild wall thickening in the descending colon and upper rectum suggesting low-grade colitis/inflammation. Prominent stool throughout the colon favors constipation. 4. Other imaging findings of potential clinical significance: Aortic Atherosclerosis (ICD10-I70.0). Mild cardiomegaly. Mild nodularity in the right lower lobe appears stable. Contracted and thick-walled gallbladder.       CURRENT THERAPY:   First line chemo weekly Taxol and Herceptin/Perjeta every 3 weeks starting 04/07/18. She developed infusion reaction to taxol and it was discontinued. Changed to Abraxane on C1D8.   INTERVAL HISTORY: Teresa Hays is here for a follow up and treatment. She presents to the clinic today accompanied by family member. She notes watery eyes for both eyes and rash under eye. She does have itching from this as well. This is effecting her sleep. She notes her appetite is adequate and she notes legs weakness. She notes she feels lump in her left breast.     REVIEW OF SYSTEMS:   Constitutional: Denies fevers, chills  or abnormal weight loss Eyes: Denies blurriness of vision (+) b/l watery eyes with itching and rash under eye  Ears, nose, mouth, throat, and face: Denies mucositis or sore throat Respiratory: Denies cough, dyspnea or wheezes Cardiovascular: Denies palpitation, chest discomfort or lower extremity swelling Gastrointestinal:  Denies nausea, heartburn or change in bowel habits Skin: Denies abnormal skin rashes Lymphatics: Denies new lymphadenopathy or easy bruising Neurological:Denies numbness, tingling (+) leg weakness Behavioral/Psych: Mood is stable, no new changes  Breast: (+) palpable lump in left breast  All other systems were reviewed with the patient and are negative.  MEDICAL HISTORY:  Past Medical History:  Diagnosis Date  . Cancer ([place])   . GERD (gastroesophageal reflux disease)   . Hypertension     SURGICAL HISTORY: Past Surgical History:  Procedure Laterality Date  . COLONOSCOPY    . ESOPHAGOGASTRODUODENOSCOPY ENDOSCOPY    . IR IMAGING GUIDED PORT INSERTION  04/04/2018    I have reviewed the social history and family history with the patient and they are unchanged from previous note.  ALLERGIES:  has No Known Allergies.  MEDICATIONS:  Current Outpatient Medications  Medication Sig Dispense Refill  . amLODipine (NORVASC) 5 MG tablet Take 5 mg by mouth daily.     Marland Kitchen atenolol (TENORMIN) 50 MG tablet Take 50 mg by mouth every evening.     . diphenoxylate-atropine (LOMOTIL) 2.5-0.025 MG tablet Take 2 tablets by mouth 4 (four) times daily as needed for diarrhea  or loose stools. 30 tablet 0  . dronabinol (MARINOL) 2.5 MG capsule Take 1 capsule twice daily before meals as needed 30 capsule 1  . lidocaine-prilocaine (EMLA) cream Apply to affected area once 30 g 3  . LORazepam (ATIVAN) 0.5 MG tablet Take 1 tablet (0.5 mg total) by mouth at bedtime as needed for anxiety. 30 tablet 0  . mirtazapine (REMERON) 7.5 MG tablet Take 1 tablet (7.5 mg total) by mouth at bedtime. 30  tablet 0  . omeprazole (PRILOSEC) 20 MG capsule Take 1 capsule (20 mg total) by mouth daily. 30 capsule 1  . ondansetron (ZOFRAN) 8 MG tablet Take 1 tablet (8 mg total) by mouth 2 (two) times daily as needed (Nausea or vomiting). 30 tablet 1  . ondansetron (ZOFRAN) 8 MG tablet TAKE 1 TABLET BY MOUTH EVERY 8 HOURS AS NEEDED FOR NAUSEA OR VOMITING 30 tablet 0  . potassium chloride SA (K-DUR,KLOR-CON) 20 MEQ tablet Take 1 tablet (20 mEq total) by mouth 2 (two) times daily. 60 tablet 1  . traMADol (ULTRAM) 50 MG tablet Take 1 tablet (50 mg total) by mouth every 6 (six) hours as needed for moderate pain or severe pain. 30 tablet 0   No current facility-administered medications for this visit.    Facility-Administered Medications Ordered in Other Visits  Medication Dose Route Frequency Provider Last Rate Last Dose  . heparin lock flush 100 unit/mL  500 Units Intracatheter Once PRN [name], MD      . ondansetron [facility]) tablet 8 mg  8 mg Oral Once [name], MD      . PACLitaxel-protein bound (ABRAXANE) chemo infusion 125 mg  80 mg/m2 (Treatment Plan Recorded) Intravenous Once [name], MD      . sodium chloride flush (NS) 0.9 % injection 10 mL  10 mL Intracatheter PRN [name], MD        PHYSICAL EXAMINATION: ECOG PERFORMANCE STATUS: 1 - Symptomatic but completely ambulatory  Vitals:   07/07/18 0909  BP: (!) 143/86  Pulse: 69  Resp: 18  Temp: 98.1 F (36.7 C)  SpO2: 100%   Filed Weights   07/07/18 0909  Weight: 133 lb 12.8 oz (60.7 kg)    GENERAL:alert, no distress and comfortable SKIN: skin color, texture, turgor are normal, no rashes or significant lesions EYES: normal, Conjunctiva are pink and non-injected, sclera clear OROPHARYNX:no exudate, no erythema and lips, buccal mucosa, and tongue normal  NECK: supple, thyroid normal size, non-tender, without nodularity LYMPH:  no palpable lymphadenopathy in the cervical, axillary or inguinal LUNGS: clear to auscultation and  percussion with normal breathing effort HEART: regular rate & rhythm and no murmurs and no lower extremity edema ABDOMEN:abdomen soft, non-tender and normal bowel sounds Musculoskeletal:no cyanosis of digits and no clubbing  NEURO: alert & oriented x 3 with fluent speech, no focal motor/sensory deficits BREAST: No palpable lump or adenopathy  LABORATORY DATA:  I have reviewed the data as listed CBC Latest Ref Rng & Units 07/07/2018 06/30/2018 06/23/2018  WBC 4.0 - 10.5 K/uL 4.1 3.9(L) 4.3  Hemoglobin 12.0 - 15.0 g/dL 9.1(L) 8.5(L) 9.2(L)  Hematocrit 36.0 - 46.0 % 28.4(L) 26.4(L) 28.2(L)  Platelets 150 - 400 K/uL 275 290 282     CMP Latest Ref Rng & Units 07/07/2018 06/30/2018 06/23/2018  Glucose 70 - 99 mg/dL 123(H) 93 94  BUN 8 - 23 mg/dL 7(L) 6(L) 9  Creatinine 0.44 - 1.00 mg/dL 0.94 0.90 0.97  Sodium 135 - 145 mmol/L 138 139 136  Potassium 3.5 - 5.1 mmol/L 3.8 3.9 3.9  Chloride 98 - 111 mmol/L 107 107 105  CO2 22 - 32 mmol/L '23 23 22  '$ Calcium 8.9 - 10.3 mg/dL 9.1 8.8(L) 9.2  Total Protein 6.5 - 8.1 g/dL 6.6 6.2(L) 6.8  Total Bilirubin 0.3 - 1.2 mg/dL 0.4 0.3 0.4  Alkaline Phos 38 - 126 U/L 89 84 102  AST 15 - 41 U/L '26 25 28  '$ ALT 0 - 44 U/L '14 12 16      '$ RADIOGRAPHIC STUDIES: I have personally reviewed the radiological images as listed and agreed with the findings in the report. No results found.   ASSESSMENT & PLAN:  Teresa Hays is a 62 y.o. female with a history of   1. 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  She presented with diffuse liver metastasis, two right breast masses and right axillary adenopathy.  Breast mass biopsy showed ER PR positive, but 1 was HER-2 positive, the other one was HER-2 negative. Liver met was HER2-.  -Given the metastatic disease, her cancer is not curable but still treatable. She is currently on First-line Herceptin/perjeta every 3 weeks and  weekly Taxol. Due to infusion reaction, taxol was switched to Abraxane C1D8.  -Her first restaging CT scan on June 06, 2018 showed good partial response.  She is tolerating treatment well. -She is tolerating moderately well with fatigue, watery eyes and general weakness.  -Due to the upcoming holiday, she will take 1 week chemo break next week.  We discussed chemo break as needed in the future. -We discussed the overall treatment plan, if she has excellent response to chemotherapy, or switch her to antiestrogen therapy and HER-2 antibody maintenance therapy after 6 to 9 months chemo. -F/u in 2 weeks with NP   2. Upper abdominal and back pain  -She has been on Tramadol half tablets every 6 hours as needed -Pain has improved since she started chemotherapy.  -I recommend she can slow down on her Tramadol and take Tylenol or Ibuprofen for mild pain.  -Pain continues to improve  3. Anorexia, Nausea, Diarrhea and weight loss  -Secondary to metastatic cancer.  Baseline weight at 168lbs -She has Mirtazapine and Marinol to use if needed.  -Currently on Zofran for nausea and Compazine only half tablets if needed as it causes tremors.  -Continue to f/u with dietician Ernestene Kiel. -Diarrhea controlled with imodium. Nausea resolved and appetite is fair.  -Overall much improved, she is eating better, weight is stable.   4. Social support  -She lives alone, has her sister and niece in [place]. Her daughter lives in a few hours away   -On Ativan PRN due to anxiety about diagnosis and treatment  5. Goal of care discussion  -she is full code   6.  Transaminitis -Secondary to her recent liver metastasis. -Improved with chemo.  LFTs are normal today (07/07/18).   7. Anemia -she developed worsening anemia since she started chemotherapy -Continue monitoring, will consider blood transfusion if hemoglobin less than 8, always symptomatic anemia. -Hg at 9.1 today (07/07/18), stable lately     8. Hypokalemia  -On oral potassium to BID  -K is normal today (07/07/18)    Plan: -Lab reviewed, adequate for treatment, will proceed weekly Abraxane today -Hemopoietic next week, and resume chemo in 2 weeks -She will continue Herceptin and Perjeta every 3 weeks   No problem-specific Assessment & Plan notes found for  this encounter.   No orders of the defined types were placed in this encounter.  All questions were answered. The patient knows to call the clinic with any problems, questions or concerns. No barriers to learning was detected. I spent 20 minutes counseling the patient face to face. The total time spent in the appointment was 25 minutes and more than 50% was on counseling and review of test results     [name], MD 07/07/2018   I, Joslyn Devon, am acting as scribe for [name], MD.   I have reviewed the above documentation for accuracy and completeness, and I agree with the above.

## 2018-07-07 NOTE — Telephone Encounter (Signed)
Printed avs and calender of upcoming appointment. Per 12/20 los 

## 2018-07-08 LAB — CANCER ANTIGEN 27.29: CA 27.29: 447.2 U/mL — ABNORMAL HIGH (ref 0.0–38.6)

## 2018-07-09 ENCOUNTER — Other Ambulatory Visit: Payer: Self-pay | Admitting: Nurse Practitioner

## 2018-07-09 DIAGNOSIS — R197 Diarrhea, unspecified: Secondary | ICD-10-CM

## 2018-07-10 ENCOUNTER — Other Ambulatory Visit: Payer: Self-pay

## 2018-07-10 DIAGNOSIS — C50919 Malignant neoplasm of unspecified site of unspecified female breast: Secondary | ICD-10-CM

## 2018-07-10 DIAGNOSIS — Z7189 Other specified counseling: Secondary | ICD-10-CM

## 2018-07-10 DIAGNOSIS — R197 Diarrhea, unspecified: Secondary | ICD-10-CM

## 2018-07-10 MED ORDER — DIPHENOXYLATE-ATROPINE 2.5-0.025 MG PO TABS: 2.0000 | ORAL_TABLET | Freq: Four times a day (QID) | ORAL | 0 refills | Status: DC | PRN

## 2018-07-17 ENCOUNTER — Telehealth: Payer: Self-pay

## 2018-07-17 NOTE — Telephone Encounter (Signed)
Patient calls stating her feet and legs are swelling a lot, seeking advice from Dr. Burr Medico on what to do.

## 2018-07-17 NOTE — Telephone Encounter (Signed)
Spoke with patient regarding lower extremity swelling, instructed to elevate her legs every time she sits down, wear compression socks/stockings.  Keep her appointment on 07/20/18, if no better we will give her Lasix.  Also instructed her to limit her salt intake.    Patient verbalized an understanding.

## 2018-07-20 ENCOUNTER — Encounter: Payer: Self-pay | Admitting: Nurse Practitioner

## 2018-07-20 ENCOUNTER — Encounter: Payer: Self-pay | Admitting: Hematology

## 2018-07-20 ENCOUNTER — Inpatient Hospital Stay (HOSPITAL_BASED_OUTPATIENT_CLINIC_OR_DEPARTMENT_OTHER): Payer: 59 | Admitting: Nurse Practitioner

## 2018-07-20 ENCOUNTER — Inpatient Hospital Stay: Payer: 59

## 2018-07-20 ENCOUNTER — Inpatient Hospital Stay: Payer: 59 | Attending: Hematology

## 2018-07-20 VITALS — BP 152/90 | HR 74 | Temp 98.5°F | Resp 18 | Ht 64.0 in | Wt 140.2 lb

## 2018-07-20 DIAGNOSIS — Z5111 Encounter for antineoplastic chemotherapy: Secondary | ICD-10-CM | POA: Diagnosis not present

## 2018-07-20 DIAGNOSIS — E876 Hypokalemia: Secondary | ICD-10-CM | POA: Insufficient documentation

## 2018-07-20 DIAGNOSIS — I10 Essential (primary) hypertension: Secondary | ICD-10-CM | POA: Insufficient documentation

## 2018-07-20 DIAGNOSIS — Z7901 Long term (current) use of anticoagulants: Secondary | ICD-10-CM | POA: Insufficient documentation

## 2018-07-20 DIAGNOSIS — Z170 Estrogen receptor positive status [ER+]: Secondary | ICD-10-CM | POA: Diagnosis not present

## 2018-07-20 DIAGNOSIS — C50919 Malignant neoplasm of unspecified site of unspecified female breast: Secondary | ICD-10-CM

## 2018-07-20 DIAGNOSIS — C50411 Malignant neoplasm of upper-outer quadrant of right female breast: Secondary | ICD-10-CM

## 2018-07-20 DIAGNOSIS — R634 Abnormal weight loss: Secondary | ICD-10-CM | POA: Insufficient documentation

## 2018-07-20 DIAGNOSIS — R600 Localized edema: Secondary | ICD-10-CM | POA: Diagnosis not present

## 2018-07-20 DIAGNOSIS — Z79899 Other long term (current) drug therapy: Secondary | ICD-10-CM | POA: Diagnosis not present

## 2018-07-20 DIAGNOSIS — R630 Anorexia: Secondary | ICD-10-CM | POA: Diagnosis not present

## 2018-07-20 DIAGNOSIS — M7989 Other specified soft tissue disorders: Secondary | ICD-10-CM | POA: Diagnosis not present

## 2018-07-20 DIAGNOSIS — Z5112 Encounter for antineoplastic immunotherapy: Secondary | ICD-10-CM | POA: Diagnosis not present

## 2018-07-20 DIAGNOSIS — C787 Secondary malignant neoplasm of liver and intrahepatic bile duct: Secondary | ICD-10-CM | POA: Diagnosis not present

## 2018-07-20 DIAGNOSIS — M549 Dorsalgia, unspecified: Secondary | ICD-10-CM | POA: Diagnosis not present

## 2018-07-20 DIAGNOSIS — K219 Gastro-esophageal reflux disease without esophagitis: Secondary | ICD-10-CM | POA: Insufficient documentation

## 2018-07-20 DIAGNOSIS — I82622 Acute embolism and thrombosis of deep veins of left upper extremity: Secondary | ICD-10-CM | POA: Diagnosis not present

## 2018-07-20 DIAGNOSIS — R740 Nonspecific elevation of levels of transaminase and lactic acid dehydrogenase [LDH]: Secondary | ICD-10-CM | POA: Insufficient documentation

## 2018-07-20 DIAGNOSIS — D6481 Anemia due to antineoplastic chemotherapy: Secondary | ICD-10-CM | POA: Insufficient documentation

## 2018-07-20 DIAGNOSIS — Z95828 Presence of other vascular implants and grafts: Secondary | ICD-10-CM

## 2018-07-20 LAB — CMP (CANCER CENTER ONLY)
ALT: 13 U/L (ref 0–44)
AST: 28 U/L (ref 15–41)
Albumin: 3.6 g/dL (ref 3.5–5.0)
Alkaline Phosphatase: 77 U/L (ref 38–126)
Anion gap: 9 (ref 5–15)
BUN: 8 mg/dL (ref 8–23)
CO2: 22 mmol/L (ref 22–32)
Calcium: 9.2 mg/dL (ref 8.9–10.3)
Chloride: 107 mmol/L (ref 98–111)
Creatinine: 0.84 mg/dL (ref 0.44–1.00)
GFR, Est AFR Am: >60 mL/min (ref >60)
GFR, Estimated: >60 mL/min (ref >60)
Glucose, Bld: 99 mg/dL (ref 70–99)
Potassium: 3.9 mmol/L (ref 3.5–5.1)
Sodium: 138 mmol/L (ref 135–145)
Total Bilirubin: 0.4 mg/dL (ref 0.3–1.2)
Total Protein: 6.5 g/dL (ref 6.5–8.1)

## 2018-07-20 LAB — CBC WITH DIFFERENTIAL (CANCER CENTER ONLY)
Abs Immature Granulocytes: 0.01 10*3/uL (ref 0.00–0.07)
Basophils Absolute: 0 10*3/uL (ref 0.0–0.1)
Basophils Relative: 1 %
Eosinophils Absolute: 0.1 10*3/uL (ref 0.0–0.5)
Eosinophils Relative: 1 %
HCT: 29.2 % — ABNORMAL LOW (ref 36.0–46.0)
Hemoglobin: 9.3 g/dL — ABNORMAL LOW (ref 12.0–15.0)
Immature Granulocytes: 0 %
Lymphocytes Relative: 35 %
Lymphs Abs: 2.2 10*3/uL (ref 0.7–4.0)
MCH: 29.2 pg (ref 26.0–34.0)
MCHC: 31.8 g/dL (ref 30.0–36.0)
MCV: 91.8 fL (ref 80.0–100.0)
Monocytes Absolute: 0.6 10*3/uL (ref 0.1–1.0)
Monocytes Relative: 9 %
Neutro Abs: 3.4 10*3/uL (ref 1.7–7.7)
Neutrophils Relative %: 54 %
Platelet Count: 260 10*3/uL (ref 150–400)
RBC: 3.18 MIL/uL — ABNORMAL LOW (ref 3.87–5.11)
RDW: 18.1 % — ABNORMAL HIGH (ref 11.5–15.5)
WBC Count: 6.2 10*3/uL (ref 4.0–10.5)
nRBC: 0 % (ref 0.0–0.2)

## 2018-07-20 MED ORDER — HEPARIN SOD (PORK) LOCK FLUSH 100 UNIT/ML IV SOLN
500.0000 [IU] | Freq: Once | INTRAVENOUS | Status: AC | PRN
  Administered 2018-07-20: 500 [IU]
  Filled 2018-07-20: qty 5

## 2018-07-20 MED ORDER — ENOXAPARIN SODIUM 80 MG/0.8ML ~~LOC~~ SOLN
60.0000 mg | Freq: Once | SUBCUTANEOUS | Status: AC
  Administered 2018-07-20: 60 mg via SUBCUTANEOUS
  Filled 2018-07-20: qty 0.8

## 2018-07-20 MED ORDER — SODIUM CHLORIDE 0.9% FLUSH
10.0000 mL | INTRAVENOUS | Status: DC | PRN
  Administered 2018-07-20: 10 mL
  Filled 2018-07-20: qty 10

## 2018-07-20 MED ORDER — ENOXAPARIN SODIUM 60 MG/0.6ML ~~LOC~~ SOLN: 60.0000 mg | Freq: Once | SUBCUTANEOUS | Status: DC

## 2018-07-20 NOTE — Progress Notes (Signed)
Called pt to see if she was interested in applying for copay assistance since her benefits renewed this month.  She gave me consent to apply in her behalf and she was approved w/ the [employer] for $4,000 from 07/20/18 to 07/21/19.

## 2018-07-20 NOTE — Progress Notes (Addendum)
  [place] OFFICE PROGRESS NOTE   Diagnosis: Breast cancer  CURRENT THERAPY:  First line chemo weekly Taxol and Herceptin/Perjeta every 3 weeks starting 04/07/18. She developed infusion reaction to taxol and it was discontinued. Changed to Abraxane on C1D8.   INTERVAL HISTORY:   Teresa Hays returns as scheduled.  She completed cycle 5 Herceptin/Perjeta and weekly Abraxane beginning 06/30/2018.  She denies nausea/vomiting.  No mouth sores.  No diarrhea.  No neuropathy symptoms.  She has noted right leg edema for the past week.  She feels the entire leg is swollen.  She notes discomfort when she walks.  No shortness of breath or chest pain.  No bleeding.  She has noticed itchy watery eyes recently and thinks she has a rash around the eyes.  Symptoms are improving.  Objective:  Vital signs in last 24 hours:  Blood pressure (!) 152/90, pulse 74, temperature 98.5 F (36.9 C), temperature source Oral, resp. rate 18, height '5\' 4"'$  (1.626 m), weight 140 lb 3.2 oz (63.6 kg), SpO2 99 %.    HEENT: No thrush or ulcers. Resp: Lungs clear bilaterally. Cardio: Regular rate and rhythm. GI: Abdomen soft and nontender.  No hepatomegaly. Vascular: The entire right leg is edematous.  Skin: Mild bilateral periorbital erythematous rash. Port-A-Cath without erythema.  Lab Results:  Lab Results  Component Value Date   WBC 6.2 07/20/2018   HGB 9.3 (L) 07/20/2018   HCT 29.2 (L) 07/20/2018   MCV 91.8 07/20/2018   PLT 260 07/20/2018   NEUTROABS 3.4 07/20/2018    Imaging:  No results found.  Medications: I have reviewed the patient's current medications.  Assessment/Plan: 1. Metastatic right breast cancer to liver, cT2N1M1, stage IV, ER+/PR+/HER2+, Liver mets ER+/PR+/HER2 currently on active treatment with Herceptin/Perjeta and weekly Abraxane. 2. 1 week history of right leg edema  Disposition: Teresa Hays appears stable.  She has completed 5 cycles of Herceptin/Perjeta and  Abraxane.  She overall is tolerating well.  Plan to proceed with cycle 6 as scheduled on 07/21/2018.  She has edema of the right leg.  We are referring her for a venous Doppler.  The soonest this can be done is tomorrow.  Due to high clinical suspicion for DVT she will receive a dose of Lovenox 1 mg/kg now.  We will follow-up on the Doppler result.  She is scheduled to return for follow-up with Dr. Burr Medico in 2 weeks.  She will contact the office in the interim with any problems.  Patient seen with Dr. Benay Spice.  25 minutes were spent face-to-face at today's visit with the majority of that time involved in counseling/coordination of care.  Ned Card ANP/GNP-BC   07/20/2018  3:16 PM  This was a shared visit with Ned Card.  Teresa Hays was interviewed and examined.  There is clinical evidence of a right lower extremity deep vein thrombosis.  She was given a dose of Lovenox and will return for a Doppler tomorrow.  Julieanne Manson, MD

## 2018-07-21 ENCOUNTER — Other Ambulatory Visit: Payer: Self-pay

## 2018-07-21 ENCOUNTER — Inpatient Hospital Stay: Payer: 59

## 2018-07-21 ENCOUNTER — Telehealth: Payer: Self-pay

## 2018-07-21 ENCOUNTER — Ambulatory Visit: Payer: 59 | Admitting: Adult Health

## 2018-07-21 ENCOUNTER — Telehealth: Payer: Self-pay | Admitting: Nurse Practitioner

## 2018-07-21 ENCOUNTER — Ambulatory Visit (HOSPITAL_COMMUNITY)
Admission: RE | Admit: 2018-07-21 | Discharge: 2018-07-21 | Disposition: A | Discharge status: Home / Self Care | Payer: 59 | Source: Ambulatory Visit | Attending: Nurse Practitioner | Admitting: Nurse Practitioner

## 2018-07-21 VITALS — BP 132/87 | HR 63 | Temp 98.7°F | Resp 16

## 2018-07-21 DIAGNOSIS — C787 Secondary malignant neoplasm of liver and intrahepatic bile duct: Secondary | ICD-10-CM | POA: Diagnosis not present

## 2018-07-21 DIAGNOSIS — Z7901 Long term (current) use of anticoagulants: Secondary | ICD-10-CM | POA: Diagnosis not present

## 2018-07-21 DIAGNOSIS — Z7189 Other specified counseling: Secondary | ICD-10-CM

## 2018-07-21 DIAGNOSIS — C50919 Malignant neoplasm of unspecified site of unspecified female breast: Secondary | ICD-10-CM | POA: Diagnosis not present

## 2018-07-21 DIAGNOSIS — Z5111 Encounter for antineoplastic chemotherapy: Secondary | ICD-10-CM | POA: Diagnosis not present

## 2018-07-21 DIAGNOSIS — Z79899 Other long term (current) drug therapy: Secondary | ICD-10-CM | POA: Diagnosis not present

## 2018-07-21 DIAGNOSIS — R630 Anorexia: Secondary | ICD-10-CM | POA: Diagnosis not present

## 2018-07-21 DIAGNOSIS — C50411 Malignant neoplasm of upper-outer quadrant of right female breast: Secondary | ICD-10-CM | POA: Diagnosis not present

## 2018-07-21 DIAGNOSIS — Z170 Estrogen receptor positive status [ER+]: Secondary | ICD-10-CM | POA: Diagnosis not present

## 2018-07-21 DIAGNOSIS — Z5112 Encounter for antineoplastic immunotherapy: Secondary | ICD-10-CM | POA: Diagnosis not present

## 2018-07-21 DIAGNOSIS — R634 Abnormal weight loss: Secondary | ICD-10-CM | POA: Diagnosis not present

## 2018-07-21 MED ORDER — TRASTUZUMAB CHEMO 150 MG IV SOLR
6.0000 mg/kg | Freq: Once | INTRAVENOUS | Status: AC
  Administered 2018-07-21: 399 mg via INTRAVENOUS
  Filled 2018-07-21: qty 19

## 2018-07-21 MED ORDER — SODIUM CHLORIDE 0.9 % IV SOLN
420.0000 mg | Freq: Once | INTRAVENOUS | Status: AC
  Administered 2018-07-21: 420 mg via INTRAVENOUS
  Filled 2018-07-21: qty 14

## 2018-07-21 MED ORDER — DIPHENHYDRAMINE HCL 25 MG PO CAPS
ORAL_CAPSULE | ORAL | Status: AC
  Filled 2018-07-21: qty 2

## 2018-07-21 MED ORDER — ONDANSETRON HCL 8 MG PO TABS
ORAL_TABLET | ORAL | Status: AC
  Filled 2018-07-21: qty 1

## 2018-07-21 MED ORDER — DIPHENHYDRAMINE HCL 50 MG/ML IJ SOLN
50.0000 mg | Freq: Once | INTRAMUSCULAR | Status: AC
  Administered 2018-07-21: 50 mg via INTRAVENOUS

## 2018-07-21 MED ORDER — PACLITAXEL PROTEIN-BOUND CHEMO INJECTION 100 MG
80.0000 mg/m2 | Freq: Once | INTRAVENOUS | Status: AC
  Administered 2018-07-21: 125 mg via INTRAVENOUS
  Filled 2018-07-21: qty 25

## 2018-07-21 MED ORDER — ONDANSETRON HCL 8 MG PO TABS
8.0000 mg | ORAL_TABLET | Freq: Once | ORAL | Status: AC
  Administered 2018-07-21: 8 mg via ORAL

## 2018-07-21 MED ORDER — DIPHENHYDRAMINE HCL 50 MG/ML IJ SOLN
INTRAMUSCULAR | Status: AC
  Filled 2018-07-21: qty 1

## 2018-07-21 MED ORDER — PROCHLORPERAZINE MALEATE 10 MG PO TABS
ORAL_TABLET | ORAL | Status: AC
  Filled 2018-07-21: qty 1

## 2018-07-21 MED ORDER — HEPARIN SOD (PORK) LOCK FLUSH 100 UNIT/ML IV SOLN
500.0000 [IU] | Freq: Once | INTRAVENOUS | Status: AC | PRN
  Administered 2018-07-21: 500 [IU]
  Filled 2018-07-21: qty 5

## 2018-07-21 MED ORDER — ACETAMINOPHEN 325 MG PO TABS
650.0000 mg | ORAL_TABLET | Freq: Once | ORAL | Status: AC
  Administered 2018-07-21: 650 mg via ORAL

## 2018-07-21 MED ORDER — ACETAMINOPHEN 325 MG PO TABS
ORAL_TABLET | ORAL | Status: AC
  Filled 2018-07-21: qty 2

## 2018-07-21 MED ORDER — SODIUM CHLORIDE 0.9 % IV SOLN
Freq: Once | INTRAVENOUS | Status: AC
  Administered 2018-07-21: 09:00:00 via INTRAVENOUS
  Filled 2018-07-21: qty 250

## 2018-07-21 MED ORDER — SODIUM CHLORIDE 0.9% FLUSH
10.0000 mL | INTRAVENOUS | Status: DC | PRN
  Administered 2018-07-21: 10 mL
  Filled 2018-07-21: qty 10

## 2018-07-21 MED ORDER — PROCHLORPERAZINE MALEATE 10 MG PO TABS
10.0000 mg | ORAL_TABLET | Freq: Once | ORAL | Status: AC
  Administered 2018-07-21: 10 mg via ORAL

## 2018-07-21 NOTE — Telephone Encounter (Signed)
No los °

## 2018-07-21 NOTE — Telephone Encounter (Signed)
Received call from vascular lab that the right LE venous doppler was negative.

## 2018-07-21 NOTE — Progress Notes (Addendum)
Right lower extremity venous duplex completed, Preliminary results - Thre is no evidence of DVT or Baker's cyst. Report called to Ned Card N.P[facility] 07/21/2018, 2:49 PM

## 2018-07-21 NOTE — Patient Instructions (Signed)
[place] Discharge Instructions for Patients Receiving Chemotherapy  Today you received the following chemotherapy agents:  Perjeta, Herceptin, Abraxane  To help prevent nausea and vomiting after your treatment, we encourage you to take your nausea medication as prescribed.   If you develop nausea and vomiting that is not controlled by your nausea medication, call the clinic.   BELOW ARE SYMPTOMS THAT SHOULD BE REPORTED IMMEDIATELY:  *FEVER GREATER THAN 100.5 F  *CHILLS WITH OR WITHOUT FEVER  NAUSEA AND VOMITING THAT IS NOT CONTROLLED WITH YOUR NAUSEA MEDICATION  *UNUSUAL SHORTNESS OF BREATH  *UNUSUAL BRUISING OR BLEEDING  TENDERNESS IN MOUTH AND THROAT WITH OR WITHOUT PRESENCE OF ULCERS  *URINARY PROBLEMS  *BOWEL PROBLEMS  UNUSUAL RASH Items with * indicate a potential emergency and should be followed up as soon as possible.  Feel free to call the clinic should you have any questions or concerns. The clinic phone number is [phone].  Please show the [place] at check-in to the Emergency Department and triage nurse.

## 2018-07-23 ENCOUNTER — Other Ambulatory Visit: Payer: Self-pay | Admitting: Hematology

## 2018-07-23 DIAGNOSIS — C50919 Malignant neoplasm of unspecified site of unspecified female breast: Secondary | ICD-10-CM

## 2018-07-25 ENCOUNTER — Other Ambulatory Visit (HOSPITAL_COMMUNITY): Payer: 59

## 2018-07-28 ENCOUNTER — Inpatient Hospital Stay: Payer: 59

## 2018-07-28 ENCOUNTER — Ambulatory Visit (HOSPITAL_COMMUNITY)
Admission: RE | Admit: 2018-07-28 | Discharge: 2018-07-28 | Disposition: A | Discharge status: Home / Self Care | Payer: 59 | Source: Ambulatory Visit | Attending: Hematology | Admitting: Hematology

## 2018-07-28 VITALS — BP 139/86 | HR 64 | Temp 98.0°F | Resp 14

## 2018-07-28 DIAGNOSIS — C787 Secondary malignant neoplasm of liver and intrahepatic bile duct: Secondary | ICD-10-CM | POA: Diagnosis not present

## 2018-07-28 DIAGNOSIS — Z7189 Other specified counseling: Secondary | ICD-10-CM

## 2018-07-28 DIAGNOSIS — Z5112 Encounter for antineoplastic immunotherapy: Secondary | ICD-10-CM | POA: Diagnosis not present

## 2018-07-28 DIAGNOSIS — R630 Anorexia: Secondary | ICD-10-CM | POA: Diagnosis not present

## 2018-07-28 DIAGNOSIS — C50919 Malignant neoplasm of unspecified site of unspecified female breast: Secondary | ICD-10-CM | POA: Insufficient documentation

## 2018-07-28 DIAGNOSIS — Z7901 Long term (current) use of anticoagulants: Secondary | ICD-10-CM | POA: Diagnosis not present

## 2018-07-28 DIAGNOSIS — R634 Abnormal weight loss: Secondary | ICD-10-CM | POA: Diagnosis not present

## 2018-07-28 DIAGNOSIS — Z5111 Encounter for antineoplastic chemotherapy: Secondary | ICD-10-CM | POA: Diagnosis not present

## 2018-07-28 DIAGNOSIS — Z79899 Other long term (current) drug therapy: Secondary | ICD-10-CM | POA: Diagnosis not present

## 2018-07-28 DIAGNOSIS — Z95828 Presence of other vascular implants and grafts: Secondary | ICD-10-CM

## 2018-07-28 DIAGNOSIS — Z170 Estrogen receptor positive status [ER+]: Secondary | ICD-10-CM | POA: Diagnosis not present

## 2018-07-28 DIAGNOSIS — C50411 Malignant neoplasm of upper-outer quadrant of right female breast: Secondary | ICD-10-CM | POA: Diagnosis not present

## 2018-07-28 LAB — CBC WITH DIFFERENTIAL (CANCER CENTER ONLY)
Abs Immature Granulocytes: 0.02 10*3/uL (ref 0.00–0.07)
Basophils Absolute: 0 10*3/uL (ref 0.0–0.1)
Basophils Relative: 1 %
Eosinophils Absolute: 0.2 10*3/uL (ref 0.0–0.5)
Eosinophils Relative: 3 %
HCT: 27.9 % — ABNORMAL LOW (ref 36.0–46.0)
Hemoglobin: 9 g/dL — ABNORMAL LOW (ref 12.0–15.0)
Immature Granulocytes: 0 %
Lymphocytes Relative: 28 %
Lymphs Abs: 1.7 10*3/uL (ref 0.7–4.0)
MCH: 29.8 pg (ref 26.0–34.0)
MCHC: 32.3 g/dL (ref 30.0–36.0)
MCV: 92.4 fL (ref 80.0–100.0)
Monocytes Absolute: 0.2 10*3/uL (ref 0.1–1.0)
Monocytes Relative: 4 %
Neutro Abs: 4 10*3/uL (ref 1.7–7.7)
Neutrophils Relative %: 64 %
Platelet Count: 235 10*3/uL (ref 150–400)
RBC: 3.02 MIL/uL — ABNORMAL LOW (ref 3.87–5.11)
RDW: 16.8 % — ABNORMAL HIGH (ref 11.5–15.5)
WBC Count: 6.2 10*3/uL (ref 4.0–10.5)
nRBC: 0 % (ref 0.0–0.2)

## 2018-07-28 LAB — CMP (CANCER CENTER ONLY)
ALT: 15 U/L (ref 0–44)
AST: 24 U/L (ref 15–41)
Albumin: 3.4 g/dL — ABNORMAL LOW (ref 3.5–5.0)
Alkaline Phosphatase: 76 U/L (ref 38–126)
Anion gap: 7 (ref 5–15)
BUN: 8 mg/dL (ref 8–23)
CO2: 25 mmol/L (ref 22–32)
Calcium: 8.5 mg/dL — ABNORMAL LOW (ref 8.9–10.3)
Chloride: 108 mmol/L (ref 98–111)
Creatinine: 0.87 mg/dL (ref 0.44–1.00)
GFR, Est AFR Am: >60 mL/min (ref >60)
GFR, Estimated: >60 mL/min (ref >60)
Glucose, Bld: 89 mg/dL (ref 70–99)
Potassium: 3.9 mmol/L (ref 3.5–5.1)
Sodium: 140 mmol/L (ref 135–145)
Total Bilirubin: 0.4 mg/dL (ref 0.3–1.2)
Total Protein: 6.1 g/dL — ABNORMAL LOW (ref 6.5–8.1)

## 2018-07-28 LAB — ECHOCARDIOGRAM COMPLETE

## 2018-07-28 MED ORDER — SODIUM CHLORIDE 0.9 % IV SOLN
Freq: Once | INTRAVENOUS | Status: AC
  Administered 2018-07-28: 09:00:00 via INTRAVENOUS
  Filled 2018-07-28: qty 250

## 2018-07-28 MED ORDER — ONDANSETRON HCL 8 MG PO TABS: 8.0000 mg | ORAL_TABLET | Freq: Once | ORAL | Status: DC

## 2018-07-28 MED ORDER — PROCHLORPERAZINE MALEATE 10 MG PO TABS
ORAL_TABLET | ORAL | Status: AC
  Filled 2018-07-28: qty 1

## 2018-07-28 MED ORDER — PROCHLORPERAZINE MALEATE 10 MG PO TABS
10.0000 mg | ORAL_TABLET | Freq: Once | ORAL | Status: AC
  Administered 2018-07-28: 10 mg via ORAL

## 2018-07-28 MED ORDER — PACLITAXEL PROTEIN-BOUND CHEMO INJECTION 100 MG
80.0000 mg/m2 | Freq: Once | INTRAVENOUS | Status: AC
  Administered 2018-07-28: 125 mg via INTRAVENOUS
  Filled 2018-07-28: qty 25

## 2018-07-28 MED ORDER — SODIUM CHLORIDE 0.9% FLUSH
10.0000 mL | INTRAVENOUS | Status: DC | PRN
  Administered 2018-07-28: 10 mL
  Filled 2018-07-28: qty 10

## 2018-07-28 MED ORDER — HEPARIN SOD (PORK) LOCK FLUSH 100 UNIT/ML IV SOLN
500.0000 [IU] | Freq: Once | INTRAVENOUS | Status: AC | PRN
  Administered 2018-07-28: 500 [IU]
  Filled 2018-07-28: qty 5

## 2018-07-28 NOTE — Progress Notes (Signed)
  Echocardiogram 2D Echocardiogram has been performed.  Teresa Hays 07/28/2018, 1:37 PM

## 2018-07-28 NOTE — Patient Instructions (Signed)
[facility] Discharge Instructions for Patients Receiving Chemotherapy  Today you received the following chemotherapy agents:  Abraxane.  To help prevent nausea and vomiting after your treatment, we encourage you to take your nausea medication as directed.   If you develop nausea and vomiting that is not controlled by your nausea medication, call the clinic.   BELOW ARE SYMPTOMS THAT SHOULD BE REPORTED IMMEDIATELY:  *FEVER GREATER THAN 100.5 F  *CHILLS WITH OR WITHOUT FEVER  NAUSEA AND VOMITING THAT IS NOT CONTROLLED WITH YOUR NAUSEA MEDICATION  *UNUSUAL SHORTNESS OF BREATH  *UNUSUAL BRUISING OR BLEEDING  TENDERNESS IN MOUTH AND THROAT WITH OR WITHOUT PRESENCE OF ULCERS  *URINARY PROBLEMS  *BOWEL PROBLEMS  UNUSUAL RASH Items with * indicate a potential emergency and should be followed up as soon as possible.  Feel free to call the clinic should you have any questions or concerns. The clinic phone number is [phone].  Please show the CHEMO ALERT CARD at check-in to the Emergency Department and triage nurse.   

## 2018-07-31 ENCOUNTER — Ambulatory Visit (HOSPITAL_COMMUNITY)
Admission: RE | Admit: 2018-07-31 | Discharge: 2018-07-31 | Disposition: A | Discharge status: Home / Self Care | Payer: 59 | Source: Ambulatory Visit | Attending: Internal Medicine | Admitting: Internal Medicine

## 2018-07-31 ENCOUNTER — Encounter (HOSPITAL_COMMUNITY): Payer: Self-pay | Admitting: Internal Medicine

## 2018-07-31 VITALS — BP 122/80 | HR 75 | Wt 137.4 lb

## 2018-07-31 DIAGNOSIS — Z803 Family history of malignant neoplasm of breast: Secondary | ICD-10-CM | POA: Diagnosis not present

## 2018-07-31 DIAGNOSIS — K219 Gastro-esophageal reflux disease without esophagitis: Secondary | ICD-10-CM | POA: Insufficient documentation

## 2018-07-31 DIAGNOSIS — C50911 Malignant neoplasm of unspecified site of right female breast: Secondary | ICD-10-CM | POA: Insufficient documentation

## 2018-07-31 DIAGNOSIS — I110 Hypertensive heart disease with heart failure: Secondary | ICD-10-CM | POA: Diagnosis not present

## 2018-07-31 DIAGNOSIS — Z79899 Other long term (current) drug therapy: Secondary | ICD-10-CM | POA: Diagnosis not present

## 2018-07-31 DIAGNOSIS — C787 Secondary malignant neoplasm of liver and intrahepatic bile duct: Secondary | ICD-10-CM | POA: Diagnosis not present

## 2018-07-31 DIAGNOSIS — Z833 Family history of diabetes mellitus: Secondary | ICD-10-CM | POA: Diagnosis not present

## 2018-07-31 DIAGNOSIS — C50919 Malignant neoplasm of unspecified site of unspecified female breast: Secondary | ICD-10-CM | POA: Diagnosis not present

## 2018-07-31 NOTE — Consult Note (Signed)
CARDIO-ONCOLOGY CLINIC CONSULT NOTE  Referring Physician: Burr Medico  HPI:  Teresa Hays is a 63 y.o. female with metastatic right breast cancer referred by Dr. Burr Medico for enrollment into the Cardio-Oncology program.   Oncology History   Cancer Staging Metastatic breast cancer [facility]) Staging form: Breast, AJCC 8th Edition - Clinical stage from 03/24/2018: Stage IV (cT2, cN1, pM1, G3, ER+, PR+, HER2+) - Signed by [name], MD on 03/30/2018       Metastatic breast cancer ([place])   01/30/2018 Procedure    Colonoscopy showed small polyp in the sigmoid colon, removed, the exam of colon including the terminal ileum was otherwise negative.    01/30/2018 Procedure    EGD by Dr. Collene Mares showed small hiatal hernia, a 8 mm polypoid lesion in the cardia, biopsied.    03/09/2018 Imaging    03/09/2018 US Abdomen IMPRESSION: 1. Mass lesions throughout the liver, consistent with metastatic disease. Liver as a somewhat nodular contour suggesting underlying hepatic cirrhosis. Inhomogeneous echotexture to the liver.  2. Cholelithiasis with mild gallbladder wall thickening. A degree of cholecystitis cannot be excluded by ultrasound.  3. Portions of pancreas obscured by gas. Visualized portions of pancreas appear normal.  4. Small right kidney. Etiology uncertain. This finding potentially may be indicative of renal artery stenosis. In this regard, question whether patient is hypertensive.    03/15/2018 Imaging    CT CAP with contrast  IMPRESSION: 1. Widespread hepatic metastasis. 2. 2.6 cm lateral right breast soft tissue nodule could represent a breast primary or an incidental benign lesion. Consider correlation with mammogram and ultrasound. 3. No definite source of primary malignancy identified within the abdomen or pelvis. There is possible rectosigmoid junction wall thickening. Consider colonoscopy with attention to this area. 4. Distal esophageal wall thickening, suggesting  esophagitis.    03/24/2018 Cancer Staging    Staging form: Breast, AJCC 8th Edition - Clinical stage from 03/24/2018: Stage IV (cT2, cN1, pM1, G3, ER+, PR+, HER2+) - Signed by [name], MD on 03/30/2018    03/24/2018 Initial Biopsy    Diagnosis 1. 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 Breast, right, needle core biopsy, 9 o'clock, 7cm from nipple - INVASIVE DUCTAL CARCINOMA. -The carcinoma is somewhat morphologically dissimilar from that in part 1. It appears grade III 3. Lymph node, needle/core biopsy, right axillary - METASTATIC CARCINOMA IN 1 OF 1 LYMPH NODE (1/1).    03/24/2018 Receptors her2    Breast biopsy: 1. Estrogen Receptor: 40%, POSITIVE, STRONG-MODERATE STAINING INTENSITY Progesterone Receptor: 70%, POSITIVE, STRONG STAINING INTENSITY Proliferation Marker Ki67: 20% HER 2 equivocal by IHC 2+, POSITIVE by FISH, ratio 2.4 and copy #4.2  2. Estrogen Receptor: 60%, POSITIVE, MODERATE STAINING INTENSITY Progesterone Receptor: 40%, POSITIVE, MODERATE STAINING INTENSITY Proliferation Marker Ki67: 20% HER2 (+) by IHC 3+    03/24/2018 Initial Diagnosis    Metastatic breast cancer ([place])    03/27/2018 Pathology Results    Diagnosis Liver, needle/core biopsy, Right - METASTATIC CARCINOMA TO LIVER, CONSISTENT WITH PATIENTS CLINICAL HISTORY OF PRIMARY BREAST CARCINOMA.  ER 80%+ PR40%+ HER2- (by [facility], IHC 2+)  Ki67 50%     03/28/2018 Pathology Results    03/28/2018 Surgical Pathology Diagnosis 1. Breast, left, needle core biopsy, 9 o'clock - FIBROCYSTIC CHANGES. - THERE IS NO EVIDENCE OF MALIGNANCY. 2. Breast, left, needle core biopsy, 2 o'clock - FIBROADENOMA. - THERE IS NO EVIDENCE OF MALIGNANCY. - SEE COMMENT.  03/29/2018 Imaging    03/29/2018 Bone Scan IMPRESSION: No scintigraphic evidence of osseous metastatic disease.    03/30/2018 Imaging     Bone scan  IMPRESSION: No scintigraphic evidence of osseous metastatic disease.     04/07/2018 -  Chemotherapy    First line chemo weekly Taxol and herceptin/Perjeta every 3 weeks starting 04/07/18. She developed infusion reaction to taxol and it was discontinued. Added Abraxane on C1D8.     06/06/2018 Imaging    CT CAP IMPRESSION: 1. Generally improved appearance, with reduced axillary adenopathy and reduced enhancing component of the hepatic masses, with some of the hepatic mass is moderately smaller than on the prior exam. Reduced size of the right lateral breast mass compared to the prior 03/15/2018 exam. 2. New mild interstitial accentuation in the lungs, significance uncertain. Part of this appearance may be due to lower lung volumes on today's exam. 3. Mild wall thickening in the descending colon and upper rectum suggesting low-grade colitis/inflammation. Prominent stool throughout the colon favors constipation. 4. Other imaging findings of potential clinical significance: Aortic Atherosclerosis (ICD10-I70.0). Mild cardiomegaly. Mild nodularity in the right lower lobe appears stable. Contracted and thick-walled gallbladder.     Denies any h/o known hear disease. 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 Currently receiving Abraxan and Herceptin/Perjeta. Tolerating well. Denies SOB, orthopnea, CP, palpitations or PND.    ECHO: 07/28/18 EF 55-60% GLS -19.2% Reviewed personally.   Review of Systems: [y] = yes, [ ] = no   General: Weight gain [ ]; Weight loss [ ]; Anorexia [ ]; Fatigue Blue.Reese ]; Fever [ ]; Chills [ ]; Weakness [ ]  Cardiac: Chest pain/pressure [ ]; Resting SOB [ ]; Exertional SOB [ ]; Orthopnea [ ]; Pedal Edema [ ]; Palpitations [ ]; Syncope [ ]; Presyncope [ ]; Paroxysmal nocturnal dyspnea[ ]  Pulmonary: Cough [ ]; Wheezing[ ]; Hemoptysis[ ]; Sputum [ ]; Snoring [ ]  GI: Vomiting[ ]; Dysphagia[ ]; Melena[ ]; Hematochezia [ ]; Heartburn[ ]; Abdominal pain [ ]; Constipation [  ]; Diarrhea [ ]; BRBPR [ ]  GU: Hematuria[ ]; Dysuria [ ]; Nocturia[ ]  Vascular: Pain in legs with walking [ ]; Pain in feet with lying flat [ ]; Non-healing sores [ ]; Stroke [ ]; TIA [ ]; Slurred speech [ ];  Neuro: Headaches[ ]; Vertigo[ ]; Seizures[ ]; Paresthesias[ ];Blurred vision [ ]; Diplopia [ ]; Vision changes [ ]  Ortho/Skin: Arthritis Blue.Reese ]; Joint pain [ y]; Muscle pain [ ]; Joint swelling [ ]; Back Pain [ ]; Rash [ ]  Psych: Depression[ ]; Anxiety[ ]  Heme: Bleeding problems [ ]; Clotting disorders [ ]; Anemia [ ]  Endocrine: Diabetes [ ]; Thyroid dysfunction[ ]   Past Medical History:  Diagnosis Date  . Cancer ([place])   . GERD (gastroesophageal reflux disease)   . Hypertension     Current Outpatient Medications  Medication Sig Dispense Refill  . amLODipine (NORVASC) 5 MG tablet Take 5 mg by mouth daily.     Marland Kitchen atenolol (TENORMIN) 50 MG tablet Take 50 mg by mouth daily.     Marland Kitchen lidocaine-prilocaine (EMLA) cream Apply to affected area once 30 g 3  . omeprazole (PRILOSEC) 20 MG capsule Take 1 capsule (20 mg total) by mouth daily. 30 capsule 1  . ondansetron (ZOFRAN) 8 MG tablet Take 1 tablet (8 mg total) by mouth 2 (two) times daily as needed (Nausea or vomiting). 30 tablet 1  . potassium chloride SA (K-DUR,KLOR-CON) 20 MEQ  tablet Take 1 tablet (20 mEq total) by mouth 2 (two) times daily. 60 tablet 1  . traMADol (ULTRAM) 50 MG tablet Take 1 tablet (50 mg total) by mouth every 6 (six) hours as needed for moderate pain or severe pain. 30 tablet 0  . diphenoxylate-atropine (LOMOTIL) 2.5-0.025 MG tablet Take 2 tablets by mouth 4 (four) times daily as needed for diarrhea or loose stools. (Patient not taking: Reported on 07/31/2018) 30 tablet 0  . LORazepam (ATIVAN) 0.5 MG tablet Take 1 tablet (0.5 mg total) by mouth at bedtime as needed for anxiety. (Patient not taking: Reported on 07/31/2018) 30 tablet 0   No current facility-administered medications for this encounter.     No Known  Allergies    Social History   Socioeconomic History  . Marital status: Single    Spouse name: Not on file  . Number of children: Not on file  . Years of education: Not on file  . Highest education level: Not on file  Occupational History  . Not on file  Social Needs  . Financial resource strain: Not on file  . Food insecurity:    Worry: Not on file    Inability: Not on file  . Transportation needs:    Medical: Not on file    Non-medical: Not on file  Tobacco Use  . Smoking status: Never Smoker  . Smokeless tobacco: Never Used  Substance and Sexual Activity  . Alcohol use: No  . Drug use: Never  . Sexual activity: Not on file  Lifestyle  . Physical activity:    Days per week: Not on file    Minutes per session: Not on file  . Stress: Not on file  Relationships  . Social connections:    Talks on phone: Not on file    Gets together: Not on file    Attends religious service: Not on file    Active member of club or organization: Not on file    Attends meetings of clubs or organizations: Not on file    Relationship status: Not on file  . Intimate partner violence:    Fear of current or ex partner: Not on file    Emotionally abused: Not on file    Physically abused: Not on file    Forced sexual activity: Not on file  Other Topics Concern  . Not on file  Social History Narrative  . Not on file      Family History  Problem Relation Age of Onset  . Diabetes Mother   . Cancer Sister 70       breast cancer  . Rheum arthritis Sister     Vitals:   07/31/18 1147  BP: 122/80  Pulse: 75  SpO2: 95%  Weight: 62.3 kg (137 lb 6.4 oz)    PHYSICAL EXAM: General:  Well appearing. No respiratory difficulty HEENT: normal Neck: supple. no JVD. Carotids 2+ bilat; no bruits. No lymphadenopathy or thryomegaly appreciated. Cor: PMI nondisplaced. Regular rate & rhythm. No rubs, gallops or murmurs. Left port Lungs: clear Abdomen: soft, nontender, nondistended. No  hepatosplenomegaly. No bruits or masses. Good bowel sounds. Extremities: no cyanosis, clubbing, rash, edema Neuro: alert & oriented x 3, cranial nerves grossly intact. moves all 4 extremities w/o difficulty. Affect pleasant.    ASSESSMENT & PLAN: 1. Metastatic Right Breast Cancer with liver mets - Triple +. Currently receiving Abraxan + Herceptin/Perjeta -Explained incidence of Herceptin cardiotoxicity and role of Cardio-oncology clinic at length. Echo images reviewed personally. All  parameters stable. Reviewed signs and symptoms of HF to look for. Continue Herceptin. Follow-up with echo in 3 months.   Glori Bickers, MD  12:13 PM

## 2018-07-31 NOTE — Patient Instructions (Signed)
Please call us March to schedule your 3 month f/u.   Your physician has requested that you have an echocardiogram. Echocardiography is a painless test that uses sound waves to create images of your heart. It provides your doctor with information about the size and shape of your heart and how well your heart's chambers and valves are working. This procedure takes approximately one hour. There are no restrictions for this procedure.

## 2018-08-02 ENCOUNTER — Telehealth: Payer: Self-pay

## 2018-08-02 MED ORDER — TRAMADOL HCL 50 MG PO TABS: 50.0000 mg | ORAL_TABLET | Freq: Four times a day (QID) | ORAL | 1 refills | Status: DC | PRN

## 2018-08-02 NOTE — Telephone Encounter (Signed)
Patient calls with complaint of left shoulder, under her arm and neck pain since Sunday, tried Tylenol did not help, she received Abraxane on 1/10.  Consulted with Dr. Burr Medico and called patient back instructed refill on Tramadol was sent to her pharmacy.  Patient has an appointment to see Dr. Burr Medico on Friday will reassess at that time.  Patient verbalized an understanding.

## 2018-08-02 NOTE — Progress Notes (Signed)
[place]   [phone] [phone]) [phone]   Clinic Follow up Note   Patient Care Team: Levin Bacon, NP as PCP - General (Family Medicine) [name], MD as Consulting Physician (Gastroenterology) [name], MD as Consulting Physician (Hematology)  Date of Service:  08/04/2018  CHIEF COMPLAINT: F/u for metastatic breast cancer  SUMMARY OF ONCOLOGIC HISTORY: Oncology History   Cancer Staging Metastatic breast cancer [facility]) Staging form: Breast, AJCC 8th Edition - Clinical stage from 03/24/2018: Stage IV (cT2, cN1, pM1, G3, ER+, PR+, HER2+) - Signed by [name], MD on 03/30/2018       Metastatic breast cancer ([place])   01/30/2018 Procedure    Colonoscopy showed small polyp in the sigmoid colon, removed, the exam of colon including the terminal ileum was otherwise negative.    01/30/2018 Procedure    EGD by Dr. Collene Mares showed small hiatal hernia, a 8 mm polypoid lesion in the cardia, biopsied.    03/09/2018 Imaging    03/09/2018 US Abdomen IMPRESSION: 1. Mass lesions throughout the liver, consistent with metastatic disease. Liver as a somewhat nodular contour suggesting underlying hepatic cirrhosis. Inhomogeneous echotexture to the liver.  2. Cholelithiasis with mild gallbladder wall thickening. A degree of cholecystitis cannot be excluded by ultrasound.  3. Portions of pancreas obscured by gas. Visualized portions of pancreas appear normal.  4. Small right kidney. Etiology uncertain. This finding potentially may be indicative of renal artery stenosis. In this regard, question whether patient is hypertensive.    03/15/2018 Imaging    CT CAP with contrast  IMPRESSION: 1. Widespread hepatic metastasis. 2. 2.6 cm lateral right breast soft tissue nodule could represent a breast primary or an incidental benign lesion. Consider correlation with mammogram and ultrasound. 3. No definite source of primary malignancy identified within the abdomen or pelvis.  There is possible rectosigmoid junction wall thickening. Consider colonoscopy with attention to this area. 4. Distal esophageal wall thickening, suggesting esophagitis.    03/24/2018 Cancer Staging    Staging form: Breast, AJCC 8th Edition - Clinical stage from 03/24/2018: Stage IV (cT2, cN1, pM1, G3, ER+, PR+, HER2+) - Signed by [name], MD on 03/30/2018    03/24/2018 Initial Biopsy    Diagnosis 1. 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 Breast, right, needle core biopsy, 9 o'clock, 7cm from nipple - INVASIVE DUCTAL CARCINOMA. -The carcinoma is somewhat morphologically dissimilar from that in part 1. It appears grade III 3. Lymph node, needle/core biopsy, right axillary - METASTATIC CARCINOMA IN 1 OF 1 LYMPH NODE (1/1).    03/24/2018 Receptors her2    Breast biopsy: 1. Estrogen Receptor: 40%, POSITIVE, STRONG-MODERATE STAINING INTENSITY Progesterone Receptor: 70%, POSITIVE, STRONG STAINING INTENSITY Proliferation Marker Ki67: 20% HER 2 equivocal by IHC 2+, POSITIVE by FISH, ratio 2.4 and copy #4.2  2. Estrogen Receptor: 60%, POSITIVE, MODERATE STAINING INTENSITY Progesterone Receptor: 40%, POSITIVE, MODERATE STAINING INTENSITY Proliferation Marker Ki67: 20% HER2 (+) by IHC 3+    03/24/2018 Initial Diagnosis    Metastatic breast cancer ([place])    03/27/2018 Pathology Results    Diagnosis Liver, needle/core biopsy, Right - METASTATIC CARCINOMA TO LIVER, CONSISTENT WITH PATIENTS CLINICAL HISTORY OF PRIMARY BREAST CARCINOMA.  ER 80%+ PR40%+ HER2- (by [facility], IHC 2+)  Ki67 50%     03/28/2018 Pathology Results    03/28/2018 Surgical Pathology Diagnosis 1. Breast, left, needle core biopsy, 9 o'clock - FIBROCYSTIC CHANGES. - THERE  IS NO EVIDENCE OF MALIGNANCY. 2. Breast, left, needle core biopsy, 2 o'clock - FIBROADENOMA. - THERE IS NO EVIDENCE OF MALIGNANCY. - SEE COMMENT.    03/29/2018  Imaging    03/29/2018 Bone Scan IMPRESSION: No scintigraphic evidence of osseous metastatic disease.    03/30/2018 Imaging    Bone scan  IMPRESSION: No scintigraphic evidence of osseous metastatic disease.     04/07/2018 -  Chemotherapy    First line chemo weekly Taxol and herceptin/Perjeta every 3 weeks starting 04/07/18. She developed infusion reaction to taxol and it was discontinued. Added Abraxane on C1D8.     06/06/2018 Imaging    CT CAP IMPRESSION: 1. Generally improved appearance, with reduced axillary adenopathy and reduced enhancing component of the hepatic masses, with some of the hepatic mass is moderately smaller than on the prior exam. Reduced size of the right lateral breast mass compared to the prior 03/15/2018 exam. 2. New mild interstitial accentuation in the lungs, significance uncertain. Part of this appearance may be due to lower lung volumes on today's exam. 3. Mild wall thickening in the descending colon and upper rectum suggesting low-grade colitis/inflammation. Prominent stool throughout the colon favors constipation. 4. Other imaging findings of potential clinical significance: Aortic Atherosclerosis (ICD10-I70.0). Mild cardiomegaly. Mild nodularity in the right lower lobe appears stable. Contracted and thick-walled gallbladder.       CURRENT THERAPY:  First line weekly Taxol with Herceptin/Perjeta every 3 weeks starting 04/07/18. She developed infusion reaction to taxol and it was discontinued. Changed to Abraxane on C1D8.   INTERVAL HISTORY:  Teresa Hays is here for a follow up and ongoing treatment. She presents to the clinic today by herself. She notes her neck and left arm are swollen. She notes this started with pain 6 days ago and swelling developed 3 month ago.2. she is willing to do doppler today.    REVIEW OF SYSTEMS:   Constitutional: Denies fevers, chills or abnormal weight loss Eyes: Denies blurriness of vision Ears, nose, mouth, throat,  and face: Denies mucositis or sore throat Respiratory: Denies cough, dyspnea or wheezes Cardiovascular: Denies palpitation, chest discomfort  (+0 neck and left arm swelling Gastrointestinal:  Denies nausea, heartburn or change in bowel habits Skin: Denies abnormal skin rashes Lymphatics: Denies new lymphadenopathy or easy bruising Neurological:Denies numbness, tingling or new weaknesses Behavioral/Psych: Mood is stable, no new changes  All other systems were reviewed with the patient and are negative.  MEDICAL HISTORY:  Past Medical History:  Diagnosis Date  . Cancer ([place])   . GERD (gastroesophageal reflux disease)   . Hypertension     SURGICAL HISTORY: Past Surgical History:  Procedure Laterality Date  . COLONOSCOPY    . ESOPHAGOGASTRODUODENOSCOPY ENDOSCOPY    . IR IMAGING GUIDED PORT INSERTION  04/04/2018    I have reviewed the social history and family history with the patient and they are unchanged from previous note.  ALLERGIES:  has No Known Allergies.  MEDICATIONS:  Current Outpatient Medications  Medication Sig Dispense Refill  . amLODipine (NORVASC) 5 MG tablet Take 5 mg by mouth daily.     Marland Kitchen atenolol (TENORMIN) 50 MG tablet Take 50 mg by mouth daily.     . diphenoxylate-atropine (LOMOTIL) 2.5-0.025 MG tablet Take 2 tablets by mouth 4 (four) times daily as needed for diarrhea or loose stools. 30 tablet 0  . lidocaine-prilocaine (EMLA) cream Apply to affected area once 30 g 3  . LORazepam (ATIVAN) 0.5 MG tablet Take 1 tablet (0.5 mg total) by  mouth at bedtime as needed for anxiety. 30 tablet 0  . omeprazole (PRILOSEC) 20 MG capsule Take 1 capsule (20 mg total) by mouth daily. 30 capsule 1  . ondansetron (ZOFRAN) 8 MG tablet Take 1 tablet (8 mg total) by mouth 2 (two) times daily as needed (Nausea or vomiting). 30 tablet 1  . potassium chloride SA (K-DUR,KLOR-CON) 20 MEQ tablet Take 1 tablet (20 mEq total) by mouth daily. 30 tablet 1  . traMADol (ULTRAM) 50 MG tablet  Take 1 tablet (50 mg total) by mouth every 6 (six) hours as needed for moderate pain or severe pain. 30 tablet 1  . Rivaroxaban 15 & 20 MG TBPK Take as directed on package: Start with one '15mg'$  tablet by mouth twice a day with food. On Day 22, switch to one '20mg'$  tablet once a day with food. 51 each 0   Current Facility-Administered Medications  Medication Dose Route Frequency Provider Last Rate Last Dose  . heparin lock flush 100 unit/mL  500 Units Intravenous Once [name], MD        PHYSICAL EXAMINATION: ECOG PERFORMANCE STATUS: 2 - Symptomatic, <50% confined to bed  Vitals:   08/04/18 0911  BP: 138/86  Pulse: 78  Resp: 18  Temp: 98 F (36.7 C)  SpO2: 100%   Filed Weights   08/04/18 0911  Weight: 138 lb 1.6 oz (62.6 kg)    GENERAL:alert, no distress and comfortable SKIN: skin color, texture, turgor are normal, no rashes or significant lesions EYES: normal, Conjunctiva are pink and non-injected, sclera clear OROPHARYNX:no exudate, no erythema and lips, buccal mucosa, and tongue normal  NECK: supple, thyroid normal size, non-tender, without nodularity LYMPH:  no palpable lymphadenopathy in the cervical, axillary or inguinal LUNGS: clear to auscultation and percussion with normal breathing effort HEART: regular rate & rhythm and no murmurs (+) neck and left arm swelling  ABDOMEN:abdomen soft, non-tender and normal bowel sounds Musculoskeletal:no cyanosis of digits and no clubbing  NEURO: alert & oriented x 3 with fluent speech, no focal motor/sensory deficits  LABORATORY DATA:  I have reviewed the data as listed CBC Latest Ref Rng & Units 08/04/2018 07/28/2018 07/20/2018  WBC 4.0 - 10.5 K/uL 5.1 6.2 6.2  Hemoglobin 12.0 - 15.0 g/dL 8.4(L) 9.0(L) 9.3(L)  Hematocrit 36.0 - 46.0 % 26.4(L) 27.9(L) 29.2(L)  Platelets 150 - 400 K/uL 238 235 260     CMP Latest Ref Rng & Units 08/04/2018 07/28/2018 07/20/2018  Glucose 70 - 99 mg/dL 106(H) 89 99  BUN 8 - 23 mg/dL '10 8 8  '$ Creatinine  0.44 - 1.00 mg/dL 0.95 0.87 0.84  Sodium 135 - 145 mmol/L 135 140 138  Potassium 3.5 - 5.1 mmol/L 4.2 3.9 3.9  Chloride 98 - 111 mmol/L 105 108 107  CO2 22 - 32 mmol/L '23 25 22  '$ Calcium 8.9 - 10.3 mg/dL 9.2 8.5(L) 9.2  Total Protein 6.5 - 8.1 g/dL 6.5 6.1(L) 6.5  Total Bilirubin 0.3 - 1.2 mg/dL 0.5 0.4 0.4  Alkaline Phos 38 - 126 U/L 87 76 77  AST 15 - 41 U/L '18 24 28  '$ ALT 0 - 44 U/L '10 15 13      '$ RADIOGRAPHIC STUDIES: I have personally reviewed the radiological images as listed and agreed with the findings in the report. Vas [place] Upper Extremity Venous Duplex  Result Date: 08/04/2018 UPPER VENOUS STUDY  Indications: Pain Performing Technologist: Oliver Hum RVT  Examination Guidelines: A complete evaluation includes B-mode imaging, spectral Doppler, color Doppler, and  power Doppler as needed of all accessible portions of each vessel. Bilateral testing is considered an integral part of a complete examination. Limited examinations for reoccurring indications may be performed as noted.  Right Findings: +----------+------------+----------+---------+-----------+-------+ RIGHT     CompressiblePropertiesPhasicitySpontaneousSummary +----------+------------+----------+---------+-----------+-------+ Subclavian    Full                 Yes       Yes            +----------+------------+----------+---------+-----------+-------+  Left Findings: +----------+------------+----------+---------+-----------+-------+ LEFT      CompressiblePropertiesPhasicitySpontaneousSummary +----------+------------+----------+---------+-----------+-------+ IJV           None                 No        No      Acute  +----------+------------+----------+---------+-----------+-------+ Subclavian    None                 No        No      Acute  +----------+------------+----------+---------+-----------+-------+ Axillary      None                 No        No      Acute   +----------+------------+----------+---------+-----------+-------+ Brachial      None                 No        No      Acute  +----------+------------+----------+---------+-----------+-------+ Radial        Full                                          +----------+------------+----------+---------+-----------+-------+ Ulnar         Full                                          +----------+------------+----------+---------+-----------+-------+ Cephalic      Full                                          +----------+------------+----------+---------+-----------+-------+ Basilic     Partial                                  Acute  +----------+------------+----------+---------+-----------+-------+  Summary:  Right: No evidence of thrombosis in the subclavian.  Left: Findings consistent with acute deep vein thrombosis involving the left internal jugular veins, left subclavian veins, left axillary vein and left brachial veins. Findings consistent with acute superficial vein thrombosis involving the left basilic vein.  *See table(s) above for measurements and observations.    Preliminary      ASSESSMENT & PLAN:  Teresa Hays is a 63 y.o. female with   1. 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  She presented with diffuse liver metastasis, two right breast masses and right axillary adenopathy.  Breast mass biopsy showed ER PR positive, but 1 was HER-2 positive, the other one was HER-2 negative. Liver met was HER2-.  -Given the metastatic disease, her cancer is not  curable but still treatable. She is currently on First-line Herceptin/Perjeta every 3 weeks and weekly Taxol. Due to infusion reaction, taxol was switched to Abraxane C1D8.  -We discussed the overall treatment plan, if she has excellent response to chemotherapy, or switch her to antiestrogen therapy and HER-2 antibody  maintenance therapy after 6 to 9 months chemo. -I have changed her chemo to Abraxne weekly 2 weeks on and one week off, and continue Herceptin and Perjeta every 3 weeks, due to her good response. -Plan to repeat restaging scan in Feb   2. Neck and left arm swelling, DVT  -She noticed left upper extremity swelling 2 days ago, and A day ago exam is concerning for upper extremity DVT  -I recommend stat doppler study at [facility]. She agreed.  -I saw her after Doppler study, which confirmed extensive thrombosis from left IJ to subclavian vein, she does have a port on the left side, possible related to her port or her metastatic cancer. -Her pulse ox remained to be 100% after 5 minutes walking, she is not having any pulmonary symptoms, unlikely she has PE. -Discussed anticoagulation options, including Lovenox injection, Coumadin, and Xarelto.  Patient opted for Xarelto, I called in start package to [facility], she will start as soon as she gets it.  -We will continue anticoagulation indefinitely, unless she has significant bleeding or other side effects.  3. Upper abdominal and back pain  -She has been on Tramadol half tablets every 6 hours as needed -Pain continues to improve, stable   4. Anorexia, Nausea, Diarrhea and weight loss  -Secondary to metastatic cancer. Baseline weight at 168lbs -She has Mirtazapine and Marinol to use if needed.  -Currently on Zofran for nausea and Compazine only half tablets if needed as it causes tremors.  -Continue to f/u with dietician [place]. -Diarrhea controlled with imodium. Nausea resolved -overall much improved since chemo, she has gained some weight back    4. Social support  -She lives alone, has her sister and niece in [place]. Her daughter lives in a few hours away   -On Ativan PRN due to anxiety about diagnosis and treatment  5. Goal of care discussion  -she is full code  -She understands the goal of care is palliative, to prolong her  life, and improve her quality of life.  6. Transaminitis -Secondary to her recent liver metastasis. -Improved with chemo.    7. Anemia -she developed worsening anemia since she started chemotherapy -Continue monitoring, will consider blood transfusion if hemoglobin less than 8, always symptomatic anemia. stable lately  8. Hypokalemia  -On oral potassium to BID  -Since her potassium has been normal, no significant diarrhea, I will reduce her potassium to once daily    Plan: -She will start Xarelto 15 mg twice daily for her left upper extremity DVT -She will return next week to start next cycle chemotherapy -Follow-up next week -decrease KCL to 1 tab daily      No problem-specific Assessment & Plan notes found for this encounter.   No orders of the defined types were placed in this encounter.  All questions were answered. The patient knows to call the clinic with any problems, questions or concerns. No barriers to learning was detected. I spent 25 minutes counseling the patient face to face. The total time spent in the appointment was 30 minutes and more than 50% was on counseling and review of test results     [name], MD 08/04/2018   I, Joslyn Devon, am  acting as scribe for [name], MD.   I have reviewed the above documentation for accuracy and completeness, and I agree with the above.

## 2018-08-03 ENCOUNTER — Encounter: Payer: Self-pay | Admitting: Hematology

## 2018-08-03 ENCOUNTER — Other Ambulatory Visit: Payer: Self-pay | Admitting: Nurse Practitioner

## 2018-08-03 DIAGNOSIS — D6481 Anemia due to antineoplastic chemotherapy: Secondary | ICD-10-CM | POA: Insufficient documentation

## 2018-08-03 DIAGNOSIS — E876 Hypokalemia: Secondary | ICD-10-CM

## 2018-08-03 DIAGNOSIS — T451X5A Adverse effect of antineoplastic and immunosuppressive drugs, initial encounter: Secondary | ICD-10-CM | POA: Insufficient documentation

## 2018-08-04 ENCOUNTER — Inpatient Hospital Stay: Payer: 59

## 2018-08-04 ENCOUNTER — Encounter: Payer: Self-pay | Admitting: Hematology

## 2018-08-04 ENCOUNTER — Inpatient Hospital Stay: Payer: 59 | Admitting: Hematology

## 2018-08-04 ENCOUNTER — Other Ambulatory Visit: Payer: Self-pay

## 2018-08-04 ENCOUNTER — Telehealth: Payer: Self-pay | Admitting: Hematology

## 2018-08-04 ENCOUNTER — Ambulatory Visit (HOSPITAL_COMMUNITY)
Admission: RE | Admit: 2018-08-04 | Discharge: 2018-08-04 | Disposition: A | Discharge status: Home / Self Care | Payer: 59 | Source: Ambulatory Visit | Attending: Hematology | Admitting: Hematology

## 2018-08-04 VITALS — BP 138/86 | HR 78 | Temp 98.0°F | Resp 18 | Ht 64.0 in | Wt 138.1 lb

## 2018-08-04 DIAGNOSIS — R600 Localized edema: Secondary | ICD-10-CM

## 2018-08-04 DIAGNOSIS — T451X5A Adverse effect of antineoplastic and immunosuppressive drugs, initial encounter: Secondary | ICD-10-CM

## 2018-08-04 DIAGNOSIS — Z79899 Other long term (current) drug therapy: Secondary | ICD-10-CM | POA: Diagnosis not present

## 2018-08-04 DIAGNOSIS — C50919 Malignant neoplasm of unspecified site of unspecified female breast: Secondary | ICD-10-CM | POA: Diagnosis not present

## 2018-08-04 DIAGNOSIS — C50411 Malignant neoplasm of upper-outer quadrant of right female breast: Secondary | ICD-10-CM | POA: Diagnosis not present

## 2018-08-04 DIAGNOSIS — Z7901 Long term (current) use of anticoagulants: Secondary | ICD-10-CM | POA: Diagnosis not present

## 2018-08-04 DIAGNOSIS — Z5112 Encounter for antineoplastic immunotherapy: Secondary | ICD-10-CM | POA: Diagnosis not present

## 2018-08-04 DIAGNOSIS — Z170 Estrogen receptor positive status [ER+]: Secondary | ICD-10-CM | POA: Diagnosis not present

## 2018-08-04 DIAGNOSIS — Z5111 Encounter for antineoplastic chemotherapy: Secondary | ICD-10-CM | POA: Diagnosis not present

## 2018-08-04 DIAGNOSIS — R630 Anorexia: Secondary | ICD-10-CM | POA: Diagnosis not present

## 2018-08-04 DIAGNOSIS — E876 Hypokalemia: Secondary | ICD-10-CM

## 2018-08-04 DIAGNOSIS — C787 Secondary malignant neoplasm of liver and intrahepatic bile duct: Secondary | ICD-10-CM | POA: Diagnosis not present

## 2018-08-04 DIAGNOSIS — R634 Abnormal weight loss: Secondary | ICD-10-CM | POA: Diagnosis not present

## 2018-08-04 DIAGNOSIS — D6481 Anemia due to antineoplastic chemotherapy: Secondary | ICD-10-CM

## 2018-08-04 LAB — CBC WITH DIFFERENTIAL (CANCER CENTER ONLY)
Abs Immature Granulocytes: 0.02 10*3/uL (ref 0.00–0.07)
Basophils Absolute: 0 10*3/uL (ref 0.0–0.1)
Basophils Relative: 1 %
Eosinophils Absolute: 0.1 10*3/uL (ref 0.0–0.5)
Eosinophils Relative: 2 %
HCT: 26.4 % — ABNORMAL LOW (ref 36.0–46.0)
Hemoglobin: 8.4 g/dL — ABNORMAL LOW (ref 12.0–15.0)
Immature Granulocytes: 0 %
Lymphocytes Relative: 25 %
Lymphs Abs: 1.3 10*3/uL (ref 0.7–4.0)
MCH: 29.4 pg (ref 26.0–34.0)
MCHC: 31.8 g/dL (ref 30.0–36.0)
MCV: 92.3 fL (ref 80.0–100.0)
Monocytes Absolute: 0.3 10*3/uL (ref 0.1–1.0)
Monocytes Relative: 6 %
Neutro Abs: 3.4 10*3/uL (ref 1.7–7.7)
Neutrophils Relative %: 66 %
Platelet Count: 238 10*3/uL (ref 150–400)
RBC: 2.86 MIL/uL — ABNORMAL LOW (ref 3.87–5.11)
RDW: 15.8 % — ABNORMAL HIGH (ref 11.5–15.5)
WBC Count: 5.1 10*3/uL (ref 4.0–10.5)
nRBC: 0 % (ref 0.0–0.2)

## 2018-08-04 LAB — CMP (CANCER CENTER ONLY)
ALT: 10 U/L (ref 0–44)
AST: 18 U/L (ref 15–41)
Albumin: 3.4 g/dL — ABNORMAL LOW (ref 3.5–5.0)
Alkaline Phosphatase: 87 U/L (ref 38–126)
Anion gap: 7 (ref 5–15)
BUN: 10 mg/dL (ref 8–23)
CO2: 23 mmol/L (ref 22–32)
Calcium: 9.2 mg/dL (ref 8.9–10.3)
Chloride: 105 mmol/L (ref 98–111)
Creatinine: 0.95 mg/dL (ref 0.44–1.00)
GFR, Est AFR Am: >60 mL/min (ref >60)
GFR, Estimated: >60 mL/min (ref >60)
Glucose, Bld: 106 mg/dL — ABNORMAL HIGH (ref 70–99)
Potassium: 4.2 mmol/L (ref 3.5–5.1)
Sodium: 135 mmol/L (ref 135–145)
Total Bilirubin: 0.5 mg/dL (ref 0.3–1.2)
Total Protein: 6.5 g/dL (ref 6.5–8.1)

## 2018-08-04 MED ORDER — RIVAROXABAN (XARELTO) VTE STARTER PACK (15 & 20 MG): ORAL_TABLET | ORAL | 0 refills | Status: DC

## 2018-08-04 MED ORDER — POTASSIUM CHLORIDE CRYS ER 20 MEQ PO TBCR: 20.0000 meq | EXTENDED_RELEASE_TABLET | Freq: Every day | ORAL | 1 refills | Status: DC

## 2018-08-04 MED ORDER — HEPARIN SOD (PORK) LOCK FLUSH 100 UNIT/ML IV SOLN
500.0000 [IU] | Freq: Once | INTRAVENOUS | Status: DC
  Filled 2018-08-04: qty 5

## 2018-08-04 NOTE — Progress Notes (Signed)
Left upper extremity venous duplex has been completed. There is evidence of acute deep vein thrombosis involving the internal jugular, subclavian, axillary, and brachial veins of the left upper extremity. There is also evidence of acute superficial vein thrombosis involving the basilic vein of the left upper extremity. Results were given to Dr. Burr Medico.  08/04/18 11:13 AM Teresa Hays RVT

## 2018-08-04 NOTE — Telephone Encounter (Signed)
No los per 01/16. °

## 2018-08-05 LAB — CANCER ANTIGEN 27.29: CA 27.29: 149.2 U/mL — ABNORMAL HIGH (ref 0.0–38.6)

## 2018-08-11 ENCOUNTER — Inpatient Hospital Stay: Payer: 59

## 2018-08-11 VITALS — BP 141/86 | HR 73 | Temp 98.3°F | Resp 17

## 2018-08-11 DIAGNOSIS — Z79899 Other long term (current) drug therapy: Secondary | ICD-10-CM | POA: Diagnosis not present

## 2018-08-11 DIAGNOSIS — Z5111 Encounter for antineoplastic chemotherapy: Secondary | ICD-10-CM | POA: Diagnosis not present

## 2018-08-11 DIAGNOSIS — C50919 Malignant neoplasm of unspecified site of unspecified female breast: Secondary | ICD-10-CM

## 2018-08-11 DIAGNOSIS — Z5112 Encounter for antineoplastic immunotherapy: Secondary | ICD-10-CM | POA: Diagnosis not present

## 2018-08-11 DIAGNOSIS — Z7189 Other specified counseling: Secondary | ICD-10-CM

## 2018-08-11 DIAGNOSIS — R634 Abnormal weight loss: Secondary | ICD-10-CM | POA: Diagnosis not present

## 2018-08-11 DIAGNOSIS — R630 Anorexia: Secondary | ICD-10-CM | POA: Diagnosis not present

## 2018-08-11 DIAGNOSIS — Z95828 Presence of other vascular implants and grafts: Secondary | ICD-10-CM

## 2018-08-11 DIAGNOSIS — C50411 Malignant neoplasm of upper-outer quadrant of right female breast: Secondary | ICD-10-CM | POA: Diagnosis not present

## 2018-08-11 DIAGNOSIS — Z170 Estrogen receptor positive status [ER+]: Secondary | ICD-10-CM | POA: Diagnosis not present

## 2018-08-11 DIAGNOSIS — Z7901 Long term (current) use of anticoagulants: Secondary | ICD-10-CM | POA: Diagnosis not present

## 2018-08-11 DIAGNOSIS — C787 Secondary malignant neoplasm of liver and intrahepatic bile duct: Secondary | ICD-10-CM | POA: Diagnosis not present

## 2018-08-11 LAB — CBC WITH DIFFERENTIAL (CANCER CENTER ONLY)
Abs Immature Granulocytes: 0.01 10*3/uL (ref 0.00–0.07)
Basophils Absolute: 0 10*3/uL (ref 0.0–0.1)
Basophils Relative: 1 %
Eosinophils Absolute: 0.2 10*3/uL (ref 0.0–0.5)
Eosinophils Relative: 3 %
HCT: 28.1 % — ABNORMAL LOW (ref 36.0–46.0)
Hemoglobin: 8.8 g/dL — ABNORMAL LOW (ref 12.0–15.0)
Immature Granulocytes: 0 %
Lymphocytes Relative: 27 %
Lymphs Abs: 1.6 10*3/uL (ref 0.7–4.0)
MCH: 28.9 pg (ref 26.0–34.0)
MCHC: 31.3 g/dL (ref 30.0–36.0)
MCV: 92.4 fL (ref 80.0–100.0)
Monocytes Absolute: 0.4 10*3/uL (ref 0.1–1.0)
Monocytes Relative: 6 %
Neutro Abs: 3.8 10*3/uL (ref 1.7–7.7)
Neutrophils Relative %: 63 %
Platelet Count: 394 10*3/uL (ref 150–400)
RBC: 3.04 MIL/uL — ABNORMAL LOW (ref 3.87–5.11)
RDW: 15.4 % (ref 11.5–15.5)
WBC Count: 5.9 10*3/uL (ref 4.0–10.5)
nRBC: 0 % (ref 0.0–0.2)

## 2018-08-11 LAB — CMP (CANCER CENTER ONLY)
ALT: 13 U/L (ref 0–44)
AST: 22 U/L (ref 15–41)
Albumin: 3.3 g/dL — ABNORMAL LOW (ref 3.5–5.0)
Alkaline Phosphatase: 90 U/L (ref 38–126)
Anion gap: 10 (ref 5–15)
BUN: 9 mg/dL (ref 8–23)
CO2: 23 mmol/L (ref 22–32)
Calcium: 8.8 mg/dL — ABNORMAL LOW (ref 8.9–10.3)
Chloride: 107 mmol/L (ref 98–111)
Creatinine: 0.91 mg/dL (ref 0.44–1.00)
GFR, Est AFR Am: >60 mL/min (ref >60)
GFR, Estimated: >60 mL/min (ref >60)
Glucose, Bld: 124 mg/dL — ABNORMAL HIGH (ref 70–99)
Potassium: 3.7 mmol/L (ref 3.5–5.1)
Sodium: 140 mmol/L (ref 135–145)
Total Bilirubin: 0.3 mg/dL (ref 0.3–1.2)
Total Protein: 6.6 g/dL (ref 6.5–8.1)

## 2018-08-11 MED ORDER — HEPARIN SOD (PORK) LOCK FLUSH 100 UNIT/ML IV SOLN
500.0000 [IU] | Freq: Once | INTRAVENOUS | Status: AC | PRN
  Administered 2018-08-11: 500 [IU]
  Filled 2018-08-11: qty 5

## 2018-08-11 MED ORDER — DIPHENHYDRAMINE HCL 50 MG/ML IJ SOLN
50.0000 mg | Freq: Once | INTRAMUSCULAR | Status: AC
  Administered 2018-08-11: 50 mg via INTRAVENOUS

## 2018-08-11 MED ORDER — ONDANSETRON HCL 8 MG PO TABS
ORAL_TABLET | ORAL | Status: AC
  Filled 2018-08-11: qty 1

## 2018-08-11 MED ORDER — TRASTUZUMAB CHEMO 150 MG IV SOLR
6.0000 mg/kg | Freq: Once | INTRAVENOUS | Status: AC
  Administered 2018-08-11: 399 mg via INTRAVENOUS
  Filled 2018-08-11: qty 19

## 2018-08-11 MED ORDER — SODIUM CHLORIDE 0.9% FLUSH
10.0000 mL | INTRAVENOUS | Status: DC | PRN
  Administered 2018-08-11: 10 mL
  Filled 2018-08-11: qty 10

## 2018-08-11 MED ORDER — PROCHLORPERAZINE MALEATE 10 MG PO TABS
ORAL_TABLET | ORAL | Status: AC
  Filled 2018-08-11: qty 1

## 2018-08-11 MED ORDER — ONDANSETRON HCL 8 MG PO TABS
8.0000 mg | ORAL_TABLET | Freq: Once | ORAL | Status: AC
  Administered 2018-08-11: 8 mg via ORAL

## 2018-08-11 MED ORDER — DIPHENHYDRAMINE HCL 50 MG/ML IJ SOLN
INTRAMUSCULAR | Status: AC
  Filled 2018-08-11: qty 1

## 2018-08-11 MED ORDER — PACLITAXEL PROTEIN-BOUND CHEMO INJECTION 100 MG
80.0000 mg/m2 | Freq: Once | INTRAVENOUS | Status: AC
  Administered 2018-08-11: 125 mg via INTRAVENOUS
  Filled 2018-08-11: qty 25

## 2018-08-11 MED ORDER — SODIUM CHLORIDE 0.9 % IV SOLN
420.0000 mg | Freq: Once | INTRAVENOUS | Status: AC
  Administered 2018-08-11: 420 mg via INTRAVENOUS
  Filled 2018-08-11: qty 14

## 2018-08-11 MED ORDER — SODIUM CHLORIDE 0.9 % IV SOLN
Freq: Once | INTRAVENOUS | Status: AC
  Administered 2018-08-11: 09:00:00 via INTRAVENOUS
  Filled 2018-08-11: qty 250

## 2018-08-11 MED ORDER — ACETAMINOPHEN 325 MG PO TABS
650.0000 mg | ORAL_TABLET | Freq: Once | ORAL | Status: AC
  Administered 2018-08-11: 650 mg via ORAL

## 2018-08-11 MED ORDER — PROCHLORPERAZINE MALEATE 10 MG PO TABS
10.0000 mg | ORAL_TABLET | Freq: Once | ORAL | Status: AC
  Administered 2018-08-11: 10 mg via ORAL

## 2018-08-11 MED ORDER — ACETAMINOPHEN 325 MG PO TABS
ORAL_TABLET | ORAL | Status: AC
  Filled 2018-08-11: qty 2

## 2018-08-11 NOTE — Addendum Note (Signed)
Addended by: [name] on: 08/11/2018 08:41 AM   Modules accepted: Orders

## 2018-08-11 NOTE — Patient Instructions (Signed)
[facility] Discharge Instructions for Patients Receiving Chemotherapy  Today you received the following chemotherapy agents:  Perjeta, Herceptin, Abraxane  To help prevent nausea and vomiting after your treatment, we encourage you to take your nausea medication as prescribed.   If you develop nausea and vomiting that is not controlled by your nausea medication, call the clinic.   BELOW ARE SYMPTOMS THAT SHOULD BE REPORTED IMMEDIATELY:  *FEVER GREATER THAN 100.5 F  *CHILLS WITH OR WITHOUT FEVER  NAUSEA AND VOMITING THAT IS NOT CONTROLLED WITH YOUR NAUSEA MEDICATION  *UNUSUAL SHORTNESS OF BREATH  *UNUSUAL BRUISING OR BLEEDING  TENDERNESS IN MOUTH AND THROAT WITH OR WITHOUT PRESENCE OF ULCERS  *URINARY PROBLEMS  *BOWEL PROBLEMS  UNUSUAL RASH Items with * indicate a potential emergency and should be followed up as soon as possible.  Feel free to call the clinic should you have any questions or concerns. The clinic phone number is [phone].  Please show the [place] at check-in to the Emergency Department and triage nurse.

## 2018-08-16 NOTE — Progress Notes (Signed)
[place]   [phone] [phone]   Clinic Follow up Note   Patient Care Team: Levin Bacon, NP as PCP - General (Family Medicine) [name], MD as Consulting Physician (Gastroenterology) [name], MD as Consulting Physician (Hematology)  Date of Service:  08/18/2018  CHIEF COMPLAINT: F/u for metastatic breast cancer  SUMMARY OF ONCOLOGIC HISTORY: Oncology History   Cancer Staging Metastatic breast cancer [facility]) Staging form: Breast, AJCC 8th Edition - Clinical stage from 03/24/2018: Stage IV (cT2, cN1, pM1, G3, ER+, PR+, HER2+) - Signed by [name], MD on 03/30/2018       Metastatic breast cancer ([place])   01/30/2018 Procedure    Colonoscopy showed small polyp in the sigmoid colon, removed, the exam of colon including the terminal ileum was otherwise negative.    01/30/2018 Procedure    EGD by Dr. Collene Mares showed small hiatal hernia, a 8 mm polypoid lesion in the cardia, biopsied.    03/09/2018 Imaging    03/09/2018 US Abdomen IMPRESSION: 1. Mass lesions throughout the liver, consistent with metastatic disease. Liver as a somewhat nodular contour suggesting underlying hepatic cirrhosis. Inhomogeneous echotexture to the liver.  2. Cholelithiasis with mild gallbladder wall thickening. A degree of cholecystitis cannot be excluded by ultrasound.  3. Portions of pancreas obscured by gas. Visualized portions of pancreas appear normal.  4. Small right kidney. Etiology uncertain. This finding potentially may be indicative of renal artery stenosis. In this regard, question whether patient is hypertensive.    03/15/2018 Imaging    CT CAP with contrast  IMPRESSION: 1. Widespread hepatic metastasis. 2. 2.6 cm lateral right breast soft tissue nodule could represent a breast primary or an incidental benign lesion. Consider correlation with mammogram and ultrasound. 3. No definite source of primary malignancy identified within the abdomen or pelvis.  There is possible rectosigmoid junction wall thickening. Consider colonoscopy with attention to this area. 4. Distal esophageal wall thickening, suggesting esophagitis.    03/24/2018 Cancer Staging    Staging form: Breast, AJCC 8th Edition - Clinical stage from 03/24/2018: Stage IV (cT2, cN1, pM1, G3, ER+, PR+, HER2+) - Signed by [name], MD on 03/30/2018    03/24/2018 Initial Biopsy    Diagnosis 1. 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 Breast, right, needle core biopsy, 9 o'clock, 7cm from nipple - INVASIVE DUCTAL CARCINOMA. -The carcinoma is somewhat morphologically dissimilar from that in part 1. It appears grade III 3. Lymph node, needle/core biopsy, right axillary - METASTATIC CARCINOMA IN 1 OF 1 LYMPH NODE (1/1).    03/24/2018 Receptors her2    Breast biopsy: 1. Estrogen Receptor: 40%, POSITIVE, STRONG-MODERATE STAINING INTENSITY Progesterone Receptor: 70%, POSITIVE, STRONG STAINING INTENSITY Proliferation Marker Ki67: 20% HER 2 equivocal by IHC 2+, POSITIVE by FISH, ratio 2.4 and copy #4.2  2. Estrogen Receptor: 60%, POSITIVE, MODERATE STAINING INTENSITY Progesterone Receptor: 40%, POSITIVE, MODERATE STAINING INTENSITY Proliferation Marker Ki67: 20% HER2 (+) by IHC 3+    03/24/2018 Initial Diagnosis    Metastatic breast cancer ([place])    03/27/2018 Pathology Results    Diagnosis Liver, needle/core biopsy, Right - METASTATIC CARCINOMA TO LIVER, CONSISTENT WITH PATIENTS CLINICAL HISTORY OF PRIMARY BREAST CARCINOMA.  ER 80%+ PR40%+ HER2- (by [facility], IHC 2+)  Ki67 50%     03/28/2018 Pathology Results    03/28/2018 Surgical Pathology Diagnosis 1. Breast, left, needle core biopsy, 9 o'clock - FIBROCYSTIC CHANGES. - THERE  IS NO EVIDENCE OF MALIGNANCY. 2. Breast, left, needle core biopsy, 2 o'clock - FIBROADENOMA. - THERE IS NO EVIDENCE OF MALIGNANCY. - SEE COMMENT.    03/29/2018  Imaging    03/29/2018 Bone Scan IMPRESSION: No scintigraphic evidence of osseous metastatic disease.    03/30/2018 Imaging    Bone scan  IMPRESSION: No scintigraphic evidence of osseous metastatic disease.     04/07/2018 -  Chemotherapy    First line chemo weekly Taxol and herceptin/Perjeta every 3 weeks starting 04/07/18. She developed infusion reaction to taxol and it was discontinued. Added Abraxane on C1D8.     06/06/2018 Imaging    CT CAP IMPRESSION: 1. Generally improved appearance, with reduced axillary adenopathy and reduced enhancing component of the hepatic masses, with some of the hepatic mass is moderately smaller than on the prior exam. Reduced size of the right lateral breast mass compared to the prior 03/15/2018 exam. 2. New mild interstitial accentuation in the lungs, significance uncertain. Part of this appearance may be due to lower lung volumes on today's exam. 3. Mild wall thickening in the descending colon and upper rectum suggesting low-grade colitis/inflammation. Prominent stool throughout the colon favors constipation. 4. Other imaging findings of potential clinical significance: Aortic Atherosclerosis (ICD10-I70.0). Mild cardiomegaly. Mild nodularity in the right lower lobe appears stable. Contracted and thick-walled gallbladder.       CURRENT THERAPY:  First line weekly Taxol with Herceptin/Perjeta every 3 weeks starting 04/07/18. She developed infusion reaction to taxol and it was discontinued. Changed to Abraxane on C1D8 2 weeks on/1 week off.   INTERVAL HISTORY:  Teresa Hays is here for a follow up and ongoing treatment. She presents to the clinic today by herself. She notes she has finished first 2 weeks of Xarelto.   She notes she tolerating last week chemo very well. She notes her ROM of motion and pain in her left arm has resolved. She notes she has been eating adequately but still lost 1 pound.    REVIEW OF SYSTEMS:   Constitutional: Denies  fevers, chills or abnormal weight loss (+) Mild weight loss, adequate appetite  Eyes: Denies blurriness of vision Ears, nose, mouth, throat, and face: Denies mucositis or sore throat Respiratory: Denies cough, dyspnea or wheezes Cardiovascular: Denies palpitation, chest discomfort or lower extremity swelling Gastrointestinal:  Denies nausea, heartburn or change in bowel habits Skin: Denies abnormal skin rashes Lymphatics: Denies new lymphadenopathy or easy bruising Neurological:Denies numbness, tingling or new weaknesses Behavioral/Psych: Mood is stable, no new changes  All other systems were reviewed with the patient and are negative.  MEDICAL HISTORY:  Past Medical History:  Diagnosis Date  . Cancer ([place])   . GERD (gastroesophageal reflux disease)   . Hypertension     SURGICAL HISTORY: Past Surgical History:  Procedure Laterality Date  . COLONOSCOPY    . ESOPHAGOGASTRODUODENOSCOPY ENDOSCOPY    . IR IMAGING GUIDED PORT INSERTION  04/04/2018    I have reviewed the social history and family history with the patient and they are unchanged from previous note.  ALLERGIES:  has No Known Allergies.  MEDICATIONS:  Current Outpatient Medications  Medication Sig Dispense Refill  . amLODipine (NORVASC) 5 MG tablet Take 5 mg by mouth daily.     Marland Kitchen atenolol (TENORMIN) 50 MG tablet Take 50 mg by mouth daily.     . diphenoxylate-atropine (LOMOTIL) 2.5-0.025 MG tablet Take 2 tablets by mouth 4 (four) times daily as needed for diarrhea or loose stools. 30 tablet 0  .  lidocaine-prilocaine (EMLA) cream Apply to affected area once 30 g 3  . LORazepam (ATIVAN) 0.5 MG tablet Take 1 tablet (0.5 mg total) by mouth at bedtime as needed for anxiety. 30 tablet 0  . omeprazole (PRILOSEC) 20 MG capsule Take 1 capsule (20 mg total) by mouth daily. 30 capsule 3  . ondansetron (ZOFRAN) 8 MG tablet Take 1 tablet (8 mg total) by mouth 2 (two) times daily as needed (Nausea or vomiting). 30 tablet 1  .  potassium chloride SA (K-DUR,KLOR-CON) 20 MEQ tablet Take 1 tablet (20 mEq total) by mouth daily. 30 tablet 1  . Rivaroxaban 15 & 20 MG TBPK Take as directed on package: Start with one '15mg'$  tablet by mouth twice a day with food. On Day 22, switch to one '20mg'$  tablet once a day with food. 51 each 0  . traMADol (ULTRAM) 50 MG tablet Take 1 tablet (50 mg total) by mouth every 6 (six) hours as needed for moderate pain or severe pain. 30 tablet 1   No current facility-administered medications for this visit.     PHYSICAL EXAMINATION: ECOG PERFORMANCE STATUS: 1 - Symptomatic but completely ambulatory  Vitals:   08/18/18 0831  BP: 125/84  Pulse: 73  Resp: 18  Temp: 98.6 F (37 C)  SpO2: 100%   Filed Weights   08/18/18 0831  Weight: 136 lb 9.6 oz (62 kg)    GENERAL:alert, no distress and comfortable SKIN: skin color, texture, turgor are normal, no rashes or significant lesions EYES: normal, Conjunctiva are pink and non-injected, sclera clear OROPHARYNX:no exudate, no erythema and lips, buccal mucosa, and tongue normal  NECK: supple, thyroid normal size, non-tender, without nodularity LYMPH:  no palpable lymphadenopathy in the cervical, axillary or inguinal LUNGS: clear to auscultation and percussion with normal breathing effort HEART: regular rate & rhythm and no murmurs and no lower extremity edema ABDOMEN:abdomen soft, non-tender and normal bowel sounds Musculoskeletal:no cyanosis of digits and no clubbing  NEURO: alert & oriented x 3 with fluent speech, no focal motor/sensory deficits  LABORATORY DATA:  I have reviewed the data as listed CBC Latest Ref Rng & Units 08/18/2018 08/11/2018 08/04/2018  WBC 4.0 - 10.5 K/uL 7.0 5.9 5.1  Hemoglobin 12.0 - 15.0 g/dL 8.7(L) 8.8(L) 8.4(L)  Hematocrit 36.0 - 46.0 % 28.0(L) 28.1(L) 26.4(L)  Platelets 150 - 400 K/uL 293 394 238     CMP Latest Ref Rng & Units 08/18/2018 08/11/2018 08/04/2018  Glucose 70 - 99 mg/dL 93 124(H) 106(H)  BUN 8 - 23  mg/dL '10 9 10  '$ Creatinine 0.44 - 1.00 mg/dL 0.88 0.91 0.95  Sodium 135 - 145 mmol/L 139 140 135  Potassium 3.5 - 5.1 mmol/L 3.6 3.7 4.2  Chloride 98 - 111 mmol/L 106 107 105  CO2 22 - 32 mmol/L '24 23 23  '$ Calcium 8.9 - 10.3 mg/dL 9.0 8.8(L) 9.2  Total Protein 6.5 - 8.1 g/dL 6.2(L) 6.6 6.5  Total Bilirubin 0.3 - 1.2 mg/dL 0.3 0.3 0.5  Alkaline Phos 38 - 126 U/L 84 90 87  AST 15 - 41 U/L '22 22 18  '$ ALT 0 - 44 U/L '13 13 10      '$ RADIOGRAPHIC STUDIES: I have personally reviewed the radiological images as listed and agreed with the findings in the report. No results found.   ASSESSMENT & PLAN:  SHELIE LANSING is a 63 y.o. female with   1. Metastatic right breast cancer to liver, cT2N1M1, stage IV, ER+/PR+/HER2+, Liver mets ER+/PR+/HER2- -She was diagnosed  in 03/2018.She presented with diffuse liver metastasis, tworight breast massesand right axillary adenopathy. Breast mass biopsy showed ER PR positive, but 1 was HER-2 positive, the other one was HER-2 negative. Liver met was HER2-.  -Given the metastatic disease, her cancer is not curable but still treatable. She is currently on First-line Herceptin/Perjeta every 3 weeks and weekly Taxol. Due to infusionreaction, taxol was switched to Abraxane C1D8 for 2 weeks on/1 week off.  -her first restaging scan on 06/07/2019 showed good partial response  -We again discussed the overall treatment plan, if she has excellent response to chemotherapy, or switch her to antiestrogen therapy and HER-2 antibody maintenance therapy after 6 to58month chemo. -Labs reviewed, CBC shows stable anemia otherwise WNL, CMP is still pending. Overall adequate to proceed with Abraxane today  -Continue Abraxane weekly 2 weeks on and one week off, and continue Herceptin and Perjeta every 3 weeks, due to her good response. -Plan to repeat restaging scan in March   2. Left UE DVT  -She has recent swelling concerning for DVT on exam.  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 -She is currently on Xarelto '15mg'$  BID and will increase to '20mg'$  on week 4. Her left arm edema has near resolved. We will continue anticoagulation indefinitely, unless she has significant bleeding or other side effects.   3. Upper abdominal and back pain -She has been on Tramadol half tablets every 6 hours as needed -much Improved with chemo. She has not had any pain lately.   4. Anorexia, Nausea, Diarrhea and weight loss  -Secondary to metastatic cancer. Baselineweightat 168lbs -She has Mirtazapine and Marinol to use if needed. -Currently on Zofran for nausea and Compazine only half tablets if needed as it causes tremors. -Continue to f/u withdietician BarbaraNeff. -Diarrhea controlled with imodium. Nausea resolved  -Appetite is better weight still slowly trending down. I encourage her to take supplement such as Ensure   4. Social support  -She lives alone, has her sister and niece in [place] Her daughter lives in a few hours away  -On Ativan PRN due to anxietyabout diagnosis and treatment  5. Goal of care discussion  -she is full code  -She understands the goal of care is palliative, to prolong her life, and improve her quality of life.  6.Transaminitis -Secondary to her recent liver metastasis. -Improved with chemo.  7. Anemia -she developed worseninganemia since she started chemotherapy -Continue monitoring, will consider blood transfusion if hemoglobin less than 8, always symptomatic anemia. stable lately   8. Hypokalemia  -Onoral potassium once daily. Will continue  -K 3.6 today     Plan: -Continue Xarelto, next refill will be '20mg'$  daily   -Labs reviewed and adequate to proceed with Abraxane today, off chemo next week   -Lab, flush, f/u and Abraxane and Herceptin/Perjeta in 2  weeks    No problem-specific Assessment & Plan notes found for this encounter.   No orders of the defined types were placed in this encounter.  All questions were answered. The patient knows to call the clinic with any problems, questions or concerns. No barriers to learning was detected. I spent 20 minutes counseling the patient face to face. The total time spent in the appointment was 25 minutes and more than 50% was on counseling and review of test results     YTruitt Merle  MD 08/18/2018   Oneal Deputy, am acting as scribe for [name], MD.   I have reviewed the above documentation for accuracy and completeness, and I agree with the above.

## 2018-08-18 ENCOUNTER — Inpatient Hospital Stay (HOSPITAL_BASED_OUTPATIENT_CLINIC_OR_DEPARTMENT_OTHER): Payer: 59 | Admitting: Hematology

## 2018-08-18 ENCOUNTER — Telehealth: Payer: Self-pay

## 2018-08-18 ENCOUNTER — Inpatient Hospital Stay: Payer: 59 | Admitting: General Practice

## 2018-08-18 ENCOUNTER — Encounter: Payer: Self-pay | Admitting: Hematology

## 2018-08-18 ENCOUNTER — Inpatient Hospital Stay: Payer: 59

## 2018-08-18 VITALS — BP 125/84 | HR 73 | Temp 98.6°F | Resp 18 | Ht 64.0 in | Wt 136.6 lb

## 2018-08-18 DIAGNOSIS — Z7189 Other specified counseling: Secondary | ICD-10-CM

## 2018-08-18 DIAGNOSIS — C50919 Malignant neoplasm of unspecified site of unspecified female breast: Secondary | ICD-10-CM

## 2018-08-18 DIAGNOSIS — R630 Anorexia: Secondary | ICD-10-CM

## 2018-08-18 DIAGNOSIS — D6481 Anemia due to antineoplastic chemotherapy: Secondary | ICD-10-CM

## 2018-08-18 DIAGNOSIS — Z7901 Long term (current) use of anticoagulants: Secondary | ICD-10-CM

## 2018-08-18 DIAGNOSIS — Z5112 Encounter for antineoplastic immunotherapy: Secondary | ICD-10-CM | POA: Diagnosis not present

## 2018-08-18 DIAGNOSIS — Z5111 Encounter for antineoplastic chemotherapy: Secondary | ICD-10-CM | POA: Diagnosis not present

## 2018-08-18 DIAGNOSIS — T451X5A Adverse effect of antineoplastic and immunosuppressive drugs, initial encounter: Secondary | ICD-10-CM

## 2018-08-18 DIAGNOSIS — R740 Nonspecific elevation of levels of transaminase and lactic acid dehydrogenase [LDH]: Secondary | ICD-10-CM

## 2018-08-18 DIAGNOSIS — Z170 Estrogen receptor positive status [ER+]: Secondary | ICD-10-CM | POA: Diagnosis not present

## 2018-08-18 DIAGNOSIS — C787 Secondary malignant neoplasm of liver and intrahepatic bile duct: Secondary | ICD-10-CM

## 2018-08-18 DIAGNOSIS — E876 Hypokalemia: Secondary | ICD-10-CM | POA: Diagnosis not present

## 2018-08-18 DIAGNOSIS — R600 Localized edema: Secondary | ICD-10-CM

## 2018-08-18 DIAGNOSIS — I82622 Acute embolism and thrombosis of deep veins of left upper extremity: Secondary | ICD-10-CM

## 2018-08-18 DIAGNOSIS — Z79899 Other long term (current) drug therapy: Secondary | ICD-10-CM

## 2018-08-18 DIAGNOSIS — R634 Abnormal weight loss: Secondary | ICD-10-CM

## 2018-08-18 DIAGNOSIS — I10 Essential (primary) hypertension: Secondary | ICD-10-CM

## 2018-08-18 DIAGNOSIS — C50411 Malignant neoplasm of upper-outer quadrant of right female breast: Secondary | ICD-10-CM | POA: Diagnosis not present

## 2018-08-18 DIAGNOSIS — K219 Gastro-esophageal reflux disease without esophagitis: Secondary | ICD-10-CM

## 2018-08-18 DIAGNOSIS — M549 Dorsalgia, unspecified: Secondary | ICD-10-CM

## 2018-08-18 DIAGNOSIS — M7989 Other specified soft tissue disorders: Secondary | ICD-10-CM

## 2018-08-18 DIAGNOSIS — Z95828 Presence of other vascular implants and grafts: Secondary | ICD-10-CM

## 2018-08-18 LAB — CBC WITH DIFFERENTIAL (CANCER CENTER ONLY)
Abs Immature Granulocytes: 0.02 10*3/uL (ref 0.00–0.07)
Basophils Absolute: 0.1 10*3/uL (ref 0.0–0.1)
Basophils Relative: 1 %
Eosinophils Absolute: 0.2 10*3/uL (ref 0.0–0.5)
Eosinophils Relative: 2 %
HCT: 28 % — ABNORMAL LOW (ref 36.0–46.0)
Hemoglobin: 8.7 g/dL — ABNORMAL LOW (ref 12.0–15.0)
Immature Granulocytes: 0 %
Lymphocytes Relative: 27 %
Lymphs Abs: 1.9 10*3/uL (ref 0.7–4.0)
MCH: 28.7 pg (ref 26.0–34.0)
MCHC: 31.1 g/dL (ref 30.0–36.0)
MCV: 92.4 fL (ref 80.0–100.0)
Monocytes Absolute: 0.2 10*3/uL (ref 0.1–1.0)
Monocytes Relative: 4 %
Neutro Abs: 4.6 10*3/uL (ref 1.7–7.7)
Neutrophils Relative %: 66 %
Platelet Count: 293 10*3/uL (ref 150–400)
RBC: 3.03 MIL/uL — ABNORMAL LOW (ref 3.87–5.11)
RDW: 15.5 % (ref 11.5–15.5)
WBC Count: 7 10*3/uL (ref 4.0–10.5)
nRBC: 0 % (ref 0.0–0.2)

## 2018-08-18 LAB — CMP (CANCER CENTER ONLY)
ALT: 13 U/L (ref 0–44)
AST: 22 U/L (ref 15–41)
Albumin: 3.4 g/dL — ABNORMAL LOW (ref 3.5–5.0)
Alkaline Phosphatase: 84 U/L (ref 38–126)
Anion gap: 9 (ref 5–15)
BUN: 10 mg/dL (ref 8–23)
CO2: 24 mmol/L (ref 22–32)
Calcium: 9 mg/dL (ref 8.9–10.3)
Chloride: 106 mmol/L (ref 98–111)
Creatinine: 0.88 mg/dL (ref 0.44–1.00)
GFR, Est AFR Am: >60 mL/min (ref >60)
GFR, Estimated: >60 mL/min (ref >60)
Glucose, Bld: 93 mg/dL (ref 70–99)
Potassium: 3.6 mmol/L (ref 3.5–5.1)
Sodium: 139 mmol/L (ref 135–145)
Total Bilirubin: 0.3 mg/dL (ref 0.3–1.2)
Total Protein: 6.2 g/dL — ABNORMAL LOW (ref 6.5–8.1)

## 2018-08-18 MED ORDER — PACLITAXEL PROTEIN-BOUND CHEMO INJECTION 100 MG
80.0000 mg/m2 | Freq: Once | INTRAVENOUS | Status: AC
  Administered 2018-08-18: 125 mg via INTRAVENOUS
  Filled 2018-08-18: qty 25

## 2018-08-18 MED ORDER — SODIUM CHLORIDE 0.9% FLUSH
10.0000 mL | INTRAVENOUS | Status: DC | PRN
  Administered 2018-08-18: 10 mL
  Filled 2018-08-18: qty 10

## 2018-08-18 MED ORDER — PROCHLORPERAZINE MALEATE 10 MG PO TABS
ORAL_TABLET | ORAL | Status: AC
  Filled 2018-08-18: qty 1

## 2018-08-18 MED ORDER — HEPARIN SOD (PORK) LOCK FLUSH 100 UNIT/ML IV SOLN
500.0000 [IU] | Freq: Once | INTRAVENOUS | Status: AC | PRN
  Administered 2018-08-18: 500 [IU]
  Filled 2018-08-18: qty 5

## 2018-08-18 MED ORDER — ONDANSETRON HCL 8 MG PO TABS
8.0000 mg | ORAL_TABLET | Freq: Once | ORAL | Status: AC
  Administered 2018-08-18: 8 mg via ORAL

## 2018-08-18 MED ORDER — OMEPRAZOLE 20 MG PO CPDR: 20.0000 mg | DELAYED_RELEASE_CAPSULE | Freq: Every day | ORAL | 3 refills | Status: DC

## 2018-08-18 MED ORDER — SODIUM CHLORIDE 0.9 % IV SOLN
Freq: Once | INTRAVENOUS | Status: AC
  Administered 2018-08-18: 10:00:00 via INTRAVENOUS
  Filled 2018-08-18: qty 250

## 2018-08-18 MED ORDER — PROCHLORPERAZINE MALEATE 10 MG PO TABS
10.0000 mg | ORAL_TABLET | Freq: Once | ORAL | Status: AC
  Administered 2018-08-18: 10 mg via ORAL

## 2018-08-18 MED ORDER — ONDANSETRON HCL 8 MG PO TABS
ORAL_TABLET | ORAL | Status: AC
  Filled 2018-08-18: qty 1

## 2018-08-18 NOTE — Patient Instructions (Signed)
[facility] Discharge Instructions for Patients Receiving Chemotherapy  Today you received the following chemotherapy agents: Paclitaxel-protein bound (Abraxane)  To help prevent nausea and vomiting after your treatment, we encourage you to take your nausea medication as directed.    If you develop nausea and vomiting that is not controlled by your nausea medication, call the clinic.   BELOW ARE SYMPTOMS THAT SHOULD BE REPORTED IMMEDIATELY:  *FEVER GREATER THAN 100.5 F  *CHILLS WITH OR WITHOUT FEVER  NAUSEA AND VOMITING THAT IS NOT CONTROLLED WITH YOUR NAUSEA MEDICATION  *UNUSUAL SHORTNESS OF BREATH  *UNUSUAL BRUISING OR BLEEDING  TENDERNESS IN MOUTH AND THROAT WITH OR WITHOUT PRESENCE OF ULCERS  *URINARY PROBLEMS  *BOWEL PROBLEMS  UNUSUAL RASH Items with * indicate a potential emergency and should be followed up as soon as possible.  Feel free to call the clinic should you have any questions or concerns. The clinic phone number is [phone].  Please show the CHEMO ALERT CARD at check-in to the Emergency Department and triage nurse.   

## 2018-08-18 NOTE — Telephone Encounter (Signed)
Printed avs and calender of upcoming appointment. Per 1/31 los 

## 2018-08-18 NOTE — Progress Notes (Signed)
[place] Work  Clinical Social Work followed up w patient during infusion for continued assessment of psychosocial needs.  Clinical Social Worker met with patient  to offer support and assess for needs.  Per patient, her most pressing needs are financial - she is concerned about her medical bills.  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  Advised to call billing to discuss her insurance coverage and patient responsibility to get a clear understanding of her bills.  Income reduced due to being out of work.  Provided applications for several sources of financial help, including [employer] [place] and [employer].  Patient will review and complete as appropriate.  Also discussed options for self care and social connection while in treatment.  Has sisters and daughter who are very supportive and involved, information provided on [employer] activities and [employer] so patient can decide if participation in other activities would beneficial to her.  Also provided information on local resources for food banks, food distribution services and emergency financial assistance.  Will follow up w patient at next visit.  Beverely Pace, [place] Worker [facility]

## 2018-08-25 ENCOUNTER — Ambulatory Visit: Payer: 59

## 2018-08-25 ENCOUNTER — Other Ambulatory Visit: Payer: 59

## 2018-08-30 NOTE — Progress Notes (Signed)
[place]   [phone] [phone]   Clinic Follow up Note   Patient Care Team: Levin Bacon, NP as PCP - General (Family Medicine) [name], MD as Consulting Physician (Gastroenterology) [name], MD as Consulting Physician (Hematology)  Date of Service:  09/01/2018  CHIEF COMPLAINT: F/u for metastatic breast cancer  SUMMARY OF ONCOLOGIC HISTORY: Oncology History   Cancer Staging Metastatic breast cancer [facility]) Staging form: Breast, AJCC 8th Edition - Clinical stage from 03/24/2018: Stage IV (cT2, cN1, pM1, G3, ER+, PR+, HER2+) - Signed by [name], MD on 03/30/2018       Metastatic breast cancer ([place])   01/30/2018 Procedure    Colonoscopy showed small polyp in the sigmoid colon, removed, the exam of colon including the terminal ileum was otherwise negative.    01/30/2018 Procedure    EGD by Dr. Collene Mares showed small hiatal hernia, a 8 mm polypoid lesion in the cardia, biopsied.    03/09/2018 Imaging    03/09/2018 US Abdomen IMPRESSION: 1. Mass lesions throughout the liver, consistent with metastatic disease. Liver as a somewhat nodular contour suggesting underlying hepatic cirrhosis. Inhomogeneous echotexture to the liver.  2. Cholelithiasis with mild gallbladder wall thickening. A degree of cholecystitis cannot be excluded by ultrasound.  3. Portions of pancreas obscured by gas. Visualized portions of pancreas appear normal.  4. Small right kidney. Etiology uncertain. This finding potentially may be indicative of renal artery stenosis. In this regard, question whether patient is hypertensive.    03/15/2018 Imaging    CT CAP with contrast  IMPRESSION: 1. Widespread hepatic metastasis. 2. 2.6 cm lateral right breast soft tissue nodule could represent a breast primary or an incidental benign lesion. Consider correlation with mammogram and ultrasound. 3. No definite source of primary malignancy identified within the abdomen or pelvis.  There is possible rectosigmoid junction wall thickening. Consider colonoscopy with attention to this area. 4. Distal esophageal wall thickening, suggesting esophagitis.    03/24/2018 Cancer Staging    Staging form: Breast, AJCC 8th Edition - Clinical stage from 03/24/2018: Stage IV (cT2, cN1, pM1, G3, ER+, PR+, HER2+) - Signed by [name], MD on 03/30/2018    03/24/2018 Initial Biopsy    Diagnosis 1. 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 Breast, right, needle core biopsy, 9 o'clock, 7cm from nipple - INVASIVE DUCTAL CARCINOMA. -The carcinoma is somewhat morphologically dissimilar from that in part 1. It appears grade III 3. Lymph node, needle/core biopsy, right axillary - METASTATIC CARCINOMA IN 1 OF 1 LYMPH NODE (1/1).    03/24/2018 Receptors her2    Breast biopsy: 1. Estrogen Receptor: 40%, POSITIVE, STRONG-MODERATE STAINING INTENSITY Progesterone Receptor: 70%, POSITIVE, STRONG STAINING INTENSITY Proliferation Marker Ki67: 20% HER 2 equivocal by IHC 2+, POSITIVE by FISH, ratio 2.4 and copy #4.2  2. Estrogen Receptor: 60%, POSITIVE, MODERATE STAINING INTENSITY Progesterone Receptor: 40%, POSITIVE, MODERATE STAINING INTENSITY Proliferation Marker Ki67: 20% HER2 (+) by IHC 3+    03/24/2018 Initial Diagnosis    Metastatic breast cancer ([place])    03/27/2018 Pathology Results    Diagnosis Liver, needle/core biopsy, Right - METASTATIC CARCINOMA TO LIVER, CONSISTENT WITH PATIENTS CLINICAL HISTORY OF PRIMARY BREAST CARCINOMA.  ER 80%+ PR40%+ HER2- (by [facility], IHC 2+)  Ki67 50%     03/28/2018 Pathology Results    03/28/2018 Surgical Pathology Diagnosis 1. Breast, left, needle core biopsy, 9 o'clock - FIBROCYSTIC CHANGES. - THERE  IS NO EVIDENCE OF MALIGNANCY. 2. Breast, left, needle core biopsy, 2 o'clock - FIBROADENOMA. - THERE IS NO EVIDENCE OF MALIGNANCY. - SEE COMMENT.    03/29/2018  Imaging    03/29/2018 Bone Scan IMPRESSION: No scintigraphic evidence of osseous metastatic disease.    03/30/2018 Imaging    Bone scan  IMPRESSION: No scintigraphic evidence of osseous metastatic disease.     04/07/2018 -  Chemotherapy    First line chemo weekly Taxol and herceptin/Perjeta every 3 weeks starting 04/07/18. She developed infusion reaction to taxol and it was discontinued. Added Abraxane on C1D8, 2 weeks on/1 week off    06/06/2018 Imaging    CT CAP IMPRESSION: 1. Generally improved appearance, with reduced axillary adenopathy and reduced enhancing component of the hepatic masses, with some of the hepatic mass is moderately smaller than on the prior exam. Reduced size of the right lateral breast mass compared to the prior 03/15/2018 exam. 2. New mild interstitial accentuation in the lungs, significance uncertain. Part of this appearance may be due to lower lung volumes on today's exam. 3. Mild wall thickening in the descending colon and upper rectum suggesting low-grade colitis/inflammation. Prominent stool throughout the colon favors constipation. 4. Other imaging findings of potential clinical significance: Aortic Atherosclerosis (ICD10-I70.0). Mild cardiomegaly. Mild nodularity in the right lower lobe appears stable. Contracted and thick-walled gallbladder.       CURRENT THERAPY:  First line weekly TaxolwithHerceptin/Perjeta every 3 weeks starting 04/07/18. She developed infusion reaction to taxol and it was discontinued. Changed to Abraxane weekly 2 weeks on/1 week off from 07/21/2018  INTERVAL HISTORY:  [name] is here for a follow up of ongoing treatment. She presents to the clinic today by herself. She notes her left arm lymphedema has nearly resolved. She notes feeling better after her chemo week off. She has more energy and denies any pain other than her PAC site. She notes her appetite and energy have been getting better.  She notes she completes her  last starter dose of Xarelto tomorrow.    REVIEW OF SYSTEMS:   Constitutional: Denies fevers, chills or abnormal weight loss Eyes: Denies blurriness of vision Ears, nose, mouth, throat, and face: Denies mucositis or sore throat Respiratory: Denies cough, dyspnea or wheezes Cardiovascular: Denies palpitation, chest discomfort or lower extremity swelling Gastrointestinal:  Denies nausea, heartburn or change in bowel habits Skin: Denies abnormal skin rashes Lymphatics: Denies new lymphadenopathy or easy bruising (+) left arm edema much improved.  Neurological:Denies numbness, tingling or new weaknesses Behavioral/Psych: Mood is stable, no new changes  All other systems were reviewed with the patient and are negative.  MEDICAL HISTORY:  Past Medical History:  Diagnosis Date  . Cancer ([place])   . GERD (gastroesophageal reflux disease)   . Hypertension     SURGICAL HISTORY: Past Surgical History:  Procedure Laterality Date  . COLONOSCOPY    . ESOPHAGOGASTRODUODENOSCOPY ENDOSCOPY    . IR IMAGING GUIDED PORT INSERTION  04/04/2018    I have reviewed the social history and family history with the patient and they are unchanged from previous note.  ALLERGIES:  has No Known Allergies.  MEDICATIONS:  Current Outpatient Medications  Medication Sig Dispense Refill  . amLODipine (NORVASC) 5 MG tablet Take 5 mg by mouth daily.     Marland Kitchen atenolol (TENORMIN) 50 MG tablet Take 50 mg by mouth daily.     . diphenoxylate-atropine (LOMOTIL) 2.5-0.025 MG tablet Take 2 tablets by mouth 4 (four) times daily as needed for diarrhea  or loose stools. 30 tablet 0  . lidocaine-prilocaine (EMLA) cream Apply to affected area once 30 g 3  . LORazepam (ATIVAN) 0.5 MG tablet Take 1 tablet (0.5 mg total) by mouth at bedtime as needed for anxiety. 30 tablet 0  . omeprazole (PRILOSEC) 20 MG capsule Take 1 capsule (20 mg total) by mouth daily. 30 capsule 3  . ondansetron (ZOFRAN) 8 MG tablet Take 1 tablet (8 mg total)  by mouth 2 (two) times daily as needed (Nausea or vomiting). 30 tablet 1  . potassium chloride SA (K-DUR,KLOR-CON) 20 MEQ tablet Take 1 tablet (20 mEq total) by mouth daily. 30 tablet 1  . Rivaroxaban 15 & 20 MG TBPK Take as directed on package: Start with one '15mg'$  tablet by mouth twice a day with food. On Day 22, switch to one '20mg'$  tablet once a day with food. 51 each 0  . traMADol (ULTRAM) 50 MG tablet Take 1 tablet (50 mg total) by mouth every 6 (six) hours as needed for moderate pain or severe pain. 30 tablet 1   No current facility-administered medications for this visit.    Facility-Administered Medications Ordered in Other Visits  Medication Dose Route Frequency Provider Last Rate Last Dose  . heparin lock flush 100 unit/mL  500 Units Intracatheter Once PRN [name], MD      . PACLitaxel-protein bound (ABRAXANE) chemo infusion 125 mg  80 mg/m2 (Treatment Plan Recorded) Intravenous Once [name], MD      . sodium chloride flush (NS) 0.9 % injection 10 mL  10 mL Intracatheter PRN [name], MD        PHYSICAL EXAMINATION: ECOG PERFORMANCE STATUS: 1 - Symptomatic but completely ambulatory  Vitals:   09/01/18 0841  BP: (!) 149/80  Pulse: 63  Resp: 17  Temp: 98.8 F (37.1 C)  SpO2: 100%   Filed Weights   09/01/18 0841  Weight: 137 lb 11.2 oz (62.5 kg)    GENERAL:alert, no distress and comfortable SKIN: skin color, texture, turgor are normal, no rashes or significant lesions EYES: normal, Conjunctiva are pink and non-injected, sclera clear OROPHARYNX:no exudate, no erythema and lips, buccal mucosa, and tongue normal  NECK: supple, thyroid normal size, non-tender, without nodularity LYMPH:  no palpable lymphadenopathy in the cervical, axillary or inguinal LUNGS: clear to auscultation and percussion with normal breathing effort HEART: regular rate & rhythm and no murmurs and no lower extremity edema ABDOMEN:abdomen soft, non-tender and normal bowel sounds (+) No organomegaly    Musculoskeletal:no cyanosis of digits and no clubbing  NEURO: alert & oriented x 3 with fluent speech, no focal motor/sensory deficits BREAST: No palpable mass or adenopathy in either breasts   LABORATORY DATA:  I have reviewed the data as listed CBC Latest Ref Rng & Units 09/01/2018 08/18/2018 08/11/2018  WBC 4.0 - 10.5 K/uL 5.1 7.0 5.9  Hemoglobin 12.0 - 15.0 g/dL 9.6(L) 8.7(L) 8.8(L)  Hematocrit 36.0 - 46.0 % 30.9(L) 28.0(L) 28.1(L)  Platelets 150 - 400 K/uL 251 293 394     CMP Latest Ref Rng & Units 09/01/2018 08/18/2018 08/11/2018  Glucose 70 - 99 mg/dL 103(H) 93 124(H)  BUN 8 - 23 mg/dL '11 10 9  '$ Creatinine 0.44 - 1.00 mg/dL 1.00 0.88 0.91  Sodium 135 - 145 mmol/L 138 139 140  Potassium 3.5 - 5.1 mmol/L 3.7 3.6 3.7  Chloride 98 - 111 mmol/L 105 106 107  CO2 22 - 32 mmol/L '23 24 23  '$ Calcium 8.9 - 10.3 mg/dL 9.1 9.0 8.8(L)  Total Protein 6.5 - 8.1 g/dL 6.9 6.2(L) 6.6  Total Bilirubin 0.3 - 1.2 mg/dL 0.4 0.3 0.3  Alkaline Phos 38 - 126 U/L 90 84 90  AST 15 - 41 U/L '21 22 22  '$ ALT 0 - 44 U/L '11 13 13      '$ RADIOGRAPHIC STUDIES: I have personally reviewed the radiological images as listed and agreed with the findings in the report. No results found.   ASSESSMENT & PLAN:  Teresa Hays is a 63 y.o. female with   1. Metastatic right breast cancer to liver, cT2N1M1, stage IV, ER+/PR+/HER2+, Liver mets ER+/PR+/HER2- -She was diagnosed in 03/2018.She presented with diffuse liver metastasis, tworight breast massesand right axillary adenopathy. Breast mass biopsy showed ER PR positive, but 1 was HER-2 positive, the other one was HER-2 negative. Liver met was HER2-.  -Given the metastatic disease, her cancer is not curable but still treatable. She is currently on First-line Herceptin/Perjetaevery 3 weeks and weekly Taxol. Due to infusionreaction, taxol was switched to Abraxane. She is currently  for 2 weeks on/1 week off. Tolerating well.  -Her right breast mass is no longer  palpable on exam, she continues to show good response. -Labs reviewed and adequate to proceed with treatment today.  -Continue Abraxane weekly 2 weeks on and one week off,and continue Herceptin and Perjeta every 3 weeks -F/u in 3 weeks with restaging CT scan.   2. Left UE DVT -She has recent swelling concerning for DVT on exam.  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 -Her left arm edema has near resolved. -She has completed starting pack of Xarelto, I will refill at '20mg'$  today. Will continue anticoagulation indefinitely, unless she has significant bleeding or other side effects.   3. Upper abdominal and back pain -She has been on Tramadol half tablets every 6 hours as needed -much Improved with chemo. She remains to not have any pain lately.   4. Anorexia, Nausea, Diarrhea and weight loss  -Secondary to metastatic cancer. Baselineweightat 168lbs -She has Mirtazapine and Marinol to use if needed. -Currently on Zofran for nausea and Compazine only half tablets if needed as it causes tremors. -Continue to f/u withdietician BarbaraNeff. -Diarrhea controlled with imodium. Nausea resolved  -Appetite adequate and weight stable.  4. Social support  -She lives alone, has her sister and niece in [place]. Her daughter lives in a few hours away  -On Ativan PRN due to anxietyabout diagnosis and treatment  5. Goal of care discussion  -she is full code -She understands the goal of care is palliative, to prolong her life, and improve her quality of life.  6.Transaminitis -Secondary to her recent liver metastasis. -resolved now   7. Anemia -she developed worseninganemia since she started chemotherapy -Continue monitoring, will consider blood transfusion if hemoglobin less than 8, always symptomatic anemia.stable  lately  8. Hypokalemia  -Onoral potassium once daily. Will continue -K normal today     Plan: -continue Xarelto '20mg'$ , I will refill today  -Labs reviewed and adequate to proceed with Abraxane, Herceptin, Perjeta today  -Continue Abraxane weekly 2 weeks on and one week off,and continue Herceptin and Perjeta every 3 weeks -Lab, flush, F/u and treatment in 3 weeks    No problem-specific Assessment & Plan notes found for this encounter.   No orders of the defined types were placed in this encounter.  All questions were answered. The patient knows to call the clinic with any problems, questions or concerns. No barriers to learning was detected. I spent 20 minutes counseling the patient face to face. The total time spent in the appointment was 25 minutes and more than 50% was on counseling and review of test results     [name], MD 09/01/2018   I, Joslyn Devon, am acting as scribe for [name], MD.   I have reviewed the above documentation for accuracy and completeness, and I agree with the above.

## 2018-08-31 ENCOUNTER — Other Ambulatory Visit: Payer: Self-pay

## 2018-08-31 DIAGNOSIS — C50919 Malignant neoplasm of unspecified site of unspecified female breast: Secondary | ICD-10-CM

## 2018-09-01 ENCOUNTER — Telehealth: Payer: Self-pay | Admitting: Hematology

## 2018-09-01 ENCOUNTER — Inpatient Hospital Stay: Payer: 59 | Attending: Hematology | Admitting: Hematology

## 2018-09-01 ENCOUNTER — Encounter: Payer: Self-pay | Admitting: Hematology

## 2018-09-01 ENCOUNTER — Inpatient Hospital Stay: Payer: 59

## 2018-09-01 ENCOUNTER — Encounter: Payer: Self-pay | Admitting: General Practice

## 2018-09-01 ENCOUNTER — Inpatient Hospital Stay: Payer: 59 | Admitting: General Practice

## 2018-09-01 VITALS — BP 149/80 | HR 63 | Temp 98.8°F | Resp 17 | Ht 64.0 in | Wt 137.7 lb

## 2018-09-01 DIAGNOSIS — I82622 Acute embolism and thrombosis of deep veins of left upper extremity: Secondary | ICD-10-CM | POA: Insufficient documentation

## 2018-09-01 DIAGNOSIS — K8020 Calculus of gallbladder without cholecystitis without obstruction: Secondary | ICD-10-CM | POA: Insufficient documentation

## 2018-09-01 DIAGNOSIS — R634 Abnormal weight loss: Secondary | ICD-10-CM | POA: Insufficient documentation

## 2018-09-01 DIAGNOSIS — C787 Secondary malignant neoplasm of liver and intrahepatic bile duct: Secondary | ICD-10-CM | POA: Insufficient documentation

## 2018-09-01 DIAGNOSIS — Z5112 Encounter for antineoplastic immunotherapy: Secondary | ICD-10-CM | POA: Diagnosis not present

## 2018-09-01 DIAGNOSIS — D649 Anemia, unspecified: Secondary | ICD-10-CM | POA: Insufficient documentation

## 2018-09-01 DIAGNOSIS — Z170 Estrogen receptor positive status [ER+]: Secondary | ICD-10-CM | POA: Insufficient documentation

## 2018-09-01 DIAGNOSIS — C50919 Malignant neoplasm of unspecified site of unspecified female breast: Secondary | ICD-10-CM

## 2018-09-01 DIAGNOSIS — R197 Diarrhea, unspecified: Secondary | ICD-10-CM | POA: Diagnosis not present

## 2018-09-01 DIAGNOSIS — R630 Anorexia: Secondary | ICD-10-CM | POA: Diagnosis not present

## 2018-09-01 DIAGNOSIS — Z95828 Presence of other vascular implants and grafts: Secondary | ICD-10-CM

## 2018-09-01 DIAGNOSIS — C50911 Malignant neoplasm of unspecified site of right female breast: Secondary | ICD-10-CM | POA: Diagnosis not present

## 2018-09-01 DIAGNOSIS — E876 Hypokalemia: Secondary | ICD-10-CM | POA: Insufficient documentation

## 2018-09-01 DIAGNOSIS — Z79899 Other long term (current) drug therapy: Secondary | ICD-10-CM | POA: Diagnosis not present

## 2018-09-01 DIAGNOSIS — I10 Essential (primary) hypertension: Secondary | ICD-10-CM | POA: Insufficient documentation

## 2018-09-01 DIAGNOSIS — Z5111 Encounter for antineoplastic chemotherapy: Secondary | ICD-10-CM | POA: Insufficient documentation

## 2018-09-01 DIAGNOSIS — M549 Dorsalgia, unspecified: Secondary | ICD-10-CM | POA: Insufficient documentation

## 2018-09-01 DIAGNOSIS — Z7901 Long term (current) use of anticoagulants: Secondary | ICD-10-CM | POA: Diagnosis not present

## 2018-09-01 DIAGNOSIS — Z7189 Other specified counseling: Secondary | ICD-10-CM

## 2018-09-01 LAB — CBC WITH DIFFERENTIAL (CANCER CENTER ONLY)
Abs Immature Granulocytes: 0.01 10*3/uL (ref 0.00–0.07)
Basophils Absolute: 0 10*3/uL (ref 0.0–0.1)
Basophils Relative: 1 %
Eosinophils Absolute: 0.2 10*3/uL (ref 0.0–0.5)
Eosinophils Relative: 4 %
HCT: 30.9 % — ABNORMAL LOW (ref 36.0–46.0)
Hemoglobin: 9.6 g/dL — ABNORMAL LOW (ref 12.0–15.0)
Immature Granulocytes: 0 %
Lymphocytes Relative: 29 %
Lymphs Abs: 1.5 10*3/uL (ref 0.7–4.0)
MCH: 28.5 pg (ref 26.0–34.0)
MCHC: 31.1 g/dL (ref 30.0–36.0)
MCV: 91.7 fL (ref 80.0–100.0)
Monocytes Absolute: 0.4 10*3/uL (ref 0.1–1.0)
Monocytes Relative: 7 %
Neutro Abs: 3 10*3/uL (ref 1.7–7.7)
Neutrophils Relative %: 59 %
Platelet Count: 251 10*3/uL (ref 150–400)
RBC: 3.37 MIL/uL — ABNORMAL LOW (ref 3.87–5.11)
RDW: 15.7 % — ABNORMAL HIGH (ref 11.5–15.5)
WBC Count: 5.1 10*3/uL (ref 4.0–10.5)
nRBC: 0 % (ref 0.0–0.2)

## 2018-09-01 LAB — CMP (CANCER CENTER ONLY)
ALT: 11 U/L (ref 0–44)
AST: 21 U/L (ref 15–41)
Albumin: 3.7 g/dL (ref 3.5–5.0)
Alkaline Phosphatase: 90 U/L (ref 38–126)
Anion gap: 10 (ref 5–15)
BUN: 11 mg/dL (ref 8–23)
CO2: 23 mmol/L (ref 22–32)
Calcium: 9.1 mg/dL (ref 8.9–10.3)
Chloride: 105 mmol/L (ref 98–111)
Creatinine: 1 mg/dL (ref 0.44–1.00)
GFR, Est AFR Am: >60 mL/min (ref >60)
GFR, Estimated: >60 mL/min (ref >60)
Glucose, Bld: 103 mg/dL — ABNORMAL HIGH (ref 70–99)
Potassium: 3.7 mmol/L (ref 3.5–5.1)
Sodium: 138 mmol/L (ref 135–145)
Total Bilirubin: 0.4 mg/dL (ref 0.3–1.2)
Total Protein: 6.9 g/dL (ref 6.5–8.1)

## 2018-09-01 MED ORDER — PACLITAXEL PROTEIN-BOUND CHEMO INJECTION 100 MG
80.0000 mg/m2 | Freq: Once | INTRAVENOUS | Status: AC
  Administered 2018-09-01: 125 mg via INTRAVENOUS
  Filled 2018-09-01: qty 25

## 2018-09-01 MED ORDER — ONDANSETRON HCL 8 MG PO TABS
ORAL_TABLET | ORAL | Status: AC
  Filled 2018-09-01: qty 1

## 2018-09-01 MED ORDER — SODIUM CHLORIDE 0.9% FLUSH
10.0000 mL | INTRAVENOUS | Status: DC | PRN
  Administered 2018-09-01: 10 mL
  Filled 2018-09-01: qty 10

## 2018-09-01 MED ORDER — SODIUM CHLORIDE 0.9 % IV SOLN
420.0000 mg | Freq: Once | INTRAVENOUS | Status: AC
  Administered 2018-09-01: 420 mg via INTRAVENOUS
  Filled 2018-09-01: qty 14

## 2018-09-01 MED ORDER — PROCHLORPERAZINE MALEATE 10 MG PO TABS
ORAL_TABLET | ORAL | Status: AC
  Filled 2018-09-01: qty 1

## 2018-09-01 MED ORDER — DIPHENHYDRAMINE HCL 50 MG/ML IJ SOLN
50.0000 mg | Freq: Once | INTRAMUSCULAR | Status: AC
  Administered 2018-09-01: 50 mg via INTRAVENOUS

## 2018-09-01 MED ORDER — TRASTUZUMAB CHEMO 150 MG IV SOLR
6.0000 mg/kg | Freq: Once | INTRAVENOUS | Status: AC
  Administered 2018-09-01: 399 mg via INTRAVENOUS
  Filled 2018-09-01: qty 19

## 2018-09-01 MED ORDER — DIPHENHYDRAMINE HCL 25 MG PO CAPS
ORAL_CAPSULE | ORAL | Status: AC
  Filled 2018-09-01: qty 2

## 2018-09-01 MED ORDER — SODIUM CHLORIDE 0.9 % IV SOLN
Freq: Once | INTRAVENOUS | Status: AC
  Administered 2018-09-01: 11:00:00 via INTRAVENOUS
  Filled 2018-09-01: qty 250

## 2018-09-01 MED ORDER — HEPARIN SOD (PORK) LOCK FLUSH 100 UNIT/ML IV SOLN
500.0000 [IU] | Freq: Once | INTRAVENOUS | Status: AC | PRN
  Administered 2018-09-01: 500 [IU]
  Filled 2018-09-01: qty 5

## 2018-09-01 MED ORDER — PROCHLORPERAZINE MALEATE 10 MG PO TABS
10.0000 mg | ORAL_TABLET | Freq: Once | ORAL | Status: AC
  Administered 2018-09-01: 10 mg via ORAL

## 2018-09-01 MED ORDER — ACETAMINOPHEN 325 MG PO TABS
ORAL_TABLET | ORAL | Status: AC
  Filled 2018-09-01: qty 2

## 2018-09-01 MED ORDER — ONDANSETRON HCL 8 MG PO TABS
8.0000 mg | ORAL_TABLET | Freq: Once | ORAL | Status: AC
  Administered 2018-09-01: 8 mg via ORAL

## 2018-09-01 MED ORDER — ACETAMINOPHEN 325 MG PO TABS
650.0000 mg | ORAL_TABLET | Freq: Once | ORAL | Status: AC
  Administered 2018-09-01: 650 mg via ORAL

## 2018-09-01 MED ORDER — DIPHENHYDRAMINE HCL 50 MG/ML IJ SOLN
INTRAMUSCULAR | Status: AC
  Filled 2018-09-01: qty 1

## 2018-09-01 NOTE — Telephone Encounter (Signed)
No los per 2/14.

## 2018-09-01 NOTE — Patient Instructions (Signed)
[facility] Discharge Instructions for Patients Receiving Chemotherapy  Today you received the following chemotherapy agents Herceptin, Perjeta, Abraxane.  To help prevent nausea and vomiting after your treatment, we encourage you to take your nausea medication as directed.  If you develop nausea and vomiting that is not controlled by your nausea medication, call the clinic.   BELOW ARE SYMPTOMS THAT SHOULD BE REPORTED IMMEDIATELY:  *FEVER GREATER THAN 100.5 F  *CHILLS WITH OR WITHOUT FEVER  NAUSEA AND VOMITING THAT IS NOT CONTROLLED WITH YOUR NAUSEA MEDICATION  *UNUSUAL SHORTNESS OF BREATH  *UNUSUAL BRUISING OR BLEEDING  TENDERNESS IN MOUTH AND THROAT WITH OR WITHOUT PRESENCE OF ULCERS  *URINARY PROBLEMS  *BOWEL PROBLEMS  UNUSUAL RASH Items with * indicate a potential emergency and should be followed up as soon as possible.  Feel free to call the clinic should you have any questions or concerns. The clinic phone number is [phone].  Please show the CHEMO ALERT CARD at check-in to the Emergency Department and triage nurse.   

## 2018-09-01 NOTE — Progress Notes (Signed)
[facility] CSW Progress Notes  Met w patient in infusion to discuss progress on applications for financial aid.  Patient's income will be reduced as she will begin to receive disability income on 3/1.  Will work w patient to help gather needed documentation to submit to [employer] in [place] by that time.  Patient following up w [facility] as she completed their screening but did not receive the application.  Gathering other information needed to document need.  Will meet again next week in infusion.  Edwyna Shell, LCSW Clinical Social Worker Phone:  [phone]

## 2018-09-02 LAB — CANCER ANTIGEN 27.29: CA 27.29: 94.1 U/mL — ABNORMAL HIGH (ref 0.0–38.6)

## 2018-09-04 ENCOUNTER — Other Ambulatory Visit: Payer: Self-pay | Admitting: Hematology

## 2018-09-04 ENCOUNTER — Other Ambulatory Visit: Payer: Self-pay

## 2018-09-04 DIAGNOSIS — C787 Secondary malignant neoplasm of liver and intrahepatic bile duct: Secondary | ICD-10-CM

## 2018-09-04 MED ORDER — RIVAROXABAN (XARELTO) VTE STARTER PACK (15 & 20 MG): 20.0000 mg | ORAL_TABLET | Freq: Every day | ORAL | 2 refills | Status: DC

## 2018-09-05 ENCOUNTER — Telehealth: Payer: Self-pay | Admitting: *Deleted

## 2018-09-05 DIAGNOSIS — C50919 Malignant neoplasm of unspecified site of unspecified female breast: Secondary | ICD-10-CM

## 2018-09-05 DIAGNOSIS — C787 Secondary malignant neoplasm of liver and intrahepatic bile duct: Secondary | ICD-10-CM

## 2018-09-05 MED ORDER — RIVAROXABAN 20 MG PO TABS: 20.0000 mg | ORAL_TABLET | Freq: Every day | ORAL | 1 refills | Status: DC

## 2018-09-05 NOTE — Addendum Note (Signed)
Addended by: [name] on: 09/05/2018 10:49 AM   Modules accepted: Orders

## 2018-09-05 NOTE — Telephone Encounter (Signed)
Call received 12:55 pm 09-04-2018 requesting "new Xarelto order.  [name] has received starter pack.  Faxed request for continual order but received the starter pack order."    Verbal order provided with this call for Xarelto 20 mg, Take one daily with food, quantity 30.

## 2018-09-08 ENCOUNTER — Inpatient Hospital Stay: Payer: 59

## 2018-09-08 ENCOUNTER — Other Ambulatory Visit: Payer: 59 | Admitting: General Practice

## 2018-09-08 ENCOUNTER — Telehealth: Payer: Self-pay | Admitting: Hematology

## 2018-09-08 NOTE — Telephone Encounter (Signed)
R/s appt per 2/21 sch message - pt is aware of appt date and time   

## 2018-09-12 ENCOUNTER — Inpatient Hospital Stay: Payer: 59

## 2018-09-12 VITALS — BP 154/87 | HR 57 | Temp 97.8°F | Resp 16 | Wt 135.8 lb

## 2018-09-12 DIAGNOSIS — Z7901 Long term (current) use of anticoagulants: Secondary | ICD-10-CM | POA: Diagnosis not present

## 2018-09-12 DIAGNOSIS — C50919 Malignant neoplasm of unspecified site of unspecified female breast: Secondary | ICD-10-CM

## 2018-09-12 DIAGNOSIS — Z5111 Encounter for antineoplastic chemotherapy: Secondary | ICD-10-CM | POA: Diagnosis not present

## 2018-09-12 DIAGNOSIS — I82622 Acute embolism and thrombosis of deep veins of left upper extremity: Secondary | ICD-10-CM | POA: Diagnosis not present

## 2018-09-12 DIAGNOSIS — Z5112 Encounter for antineoplastic immunotherapy: Secondary | ICD-10-CM | POA: Diagnosis not present

## 2018-09-12 DIAGNOSIS — M549 Dorsalgia, unspecified: Secondary | ICD-10-CM | POA: Diagnosis not present

## 2018-09-12 DIAGNOSIS — Z170 Estrogen receptor positive status [ER+]: Secondary | ICD-10-CM | POA: Diagnosis not present

## 2018-09-12 DIAGNOSIS — I10 Essential (primary) hypertension: Secondary | ICD-10-CM | POA: Diagnosis not present

## 2018-09-12 DIAGNOSIS — C787 Secondary malignant neoplasm of liver and intrahepatic bile duct: Secondary | ICD-10-CM | POA: Diagnosis not present

## 2018-09-12 DIAGNOSIS — Z7189 Other specified counseling: Secondary | ICD-10-CM

## 2018-09-12 DIAGNOSIS — C50911 Malignant neoplasm of unspecified site of right female breast: Secondary | ICD-10-CM | POA: Diagnosis not present

## 2018-09-12 LAB — CBC WITH DIFFERENTIAL (CANCER CENTER ONLY)
Abs Immature Granulocytes: 0.02 10*3/uL (ref 0.00–0.07)
Basophils Absolute: 0 10*3/uL (ref 0.0–0.1)
Basophils Relative: 1 %
Eosinophils Absolute: 0.2 10*3/uL (ref 0.0–0.5)
Eosinophils Relative: 3 %
HCT: 32.4 % — ABNORMAL LOW (ref 36.0–46.0)
Hemoglobin: 10 g/dL — ABNORMAL LOW (ref 12.0–15.0)
Immature Granulocytes: 0 %
Lymphocytes Relative: 30 %
Lymphs Abs: 2.1 10*3/uL (ref 0.7–4.0)
MCH: 27.6 pg (ref 26.0–34.0)
MCHC: 30.9 g/dL (ref 30.0–36.0)
MCV: 89.5 fL (ref 80.0–100.0)
Monocytes Absolute: 0.3 10*3/uL (ref 0.1–1.0)
Monocytes Relative: 4 %
Neutro Abs: 4.2 10*3/uL (ref 1.7–7.7)
Neutrophils Relative %: 62 %
Platelet Count: 300 10*3/uL (ref 150–400)
RBC: 3.62 MIL/uL — ABNORMAL LOW (ref 3.87–5.11)
RDW: 15.1 % (ref 11.5–15.5)
WBC Count: 6.8 10*3/uL (ref 4.0–10.5)
nRBC: 0 % (ref 0.0–0.2)

## 2018-09-12 LAB — CMP (CANCER CENTER ONLY)
ALT: 13 U/L (ref 0–44)
AST: 26 U/L (ref 15–41)
Albumin: 3.8 g/dL (ref 3.5–5.0)
Alkaline Phosphatase: 91 U/L (ref 38–126)
Anion gap: 8 (ref 5–15)
BUN: 14 mg/dL (ref 8–23)
CO2: 25 mmol/L (ref 22–32)
Calcium: 9.4 mg/dL (ref 8.9–10.3)
Chloride: 106 mmol/L (ref 98–111)
Creatinine: 1.01 mg/dL — ABNORMAL HIGH (ref 0.44–1.00)
GFR, Est AFR Am: >60 mL/min (ref >60)
GFR, Estimated: 60 mL/min — ABNORMAL LOW (ref >60)
Glucose, Bld: 101 mg/dL — ABNORMAL HIGH (ref 70–99)
Potassium: 3.8 mmol/L (ref 3.5–5.1)
Sodium: 139 mmol/L (ref 135–145)
Total Bilirubin: 0.3 mg/dL (ref 0.3–1.2)
Total Protein: 7.2 g/dL (ref 6.5–8.1)

## 2018-09-12 MED ORDER — PROCHLORPERAZINE MALEATE 10 MG PO TABS
ORAL_TABLET | ORAL | Status: AC
  Filled 2018-09-12: qty 1

## 2018-09-12 MED ORDER — SODIUM CHLORIDE 0.9 % IV SOLN
Freq: Once | INTRAVENOUS | Status: AC
  Administered 2018-09-12: 10:00:00 via INTRAVENOUS
  Filled 2018-09-12: qty 250

## 2018-09-12 MED ORDER — ONDANSETRON HCL 8 MG PO TABS
ORAL_TABLET | ORAL | Status: AC
  Filled 2018-09-12: qty 1

## 2018-09-12 MED ORDER — SODIUM CHLORIDE 0.9% FLUSH
10.0000 mL | INTRAVENOUS | Status: DC | PRN
  Administered 2018-09-12: 10 mL
  Filled 2018-09-12: qty 10

## 2018-09-12 MED ORDER — PACLITAXEL PROTEIN-BOUND CHEMO INJECTION 100 MG
80.0000 mg/m2 | Freq: Once | INTRAVENOUS | Status: AC
  Administered 2018-09-12: 125 mg via INTRAVENOUS
  Filled 2018-09-12: qty 25

## 2018-09-12 MED ORDER — ONDANSETRON HCL 8 MG PO TABS
8.0000 mg | ORAL_TABLET | Freq: Once | ORAL | Status: AC
  Administered 2018-09-12: 8 mg via ORAL

## 2018-09-12 MED ORDER — PROCHLORPERAZINE MALEATE 10 MG PO TABS
10.0000 mg | ORAL_TABLET | Freq: Once | ORAL | Status: AC
  Administered 2018-09-12: 10 mg via ORAL

## 2018-09-12 MED ORDER — HEPARIN SOD (PORK) LOCK FLUSH 100 UNIT/ML IV SOLN
500.0000 [IU] | Freq: Once | INTRAVENOUS | Status: AC | PRN
  Administered 2018-09-12: 500 [IU]
  Filled 2018-09-12: qty 5

## 2018-09-12 NOTE — Patient Instructions (Signed)
[facility] Discharge Instructions for Patients Receiving Chemotherapy  Today you received the following chemotherapy agents:  Abraxane.  To help prevent nausea and vomiting after your treatment, we encourage you to take your nausea medication as directed.   If you develop nausea and vomiting that is not controlled by your nausea medication, call the clinic.   BELOW ARE SYMPTOMS THAT SHOULD BE REPORTED IMMEDIATELY:  *FEVER GREATER THAN 100.5 F  *CHILLS WITH OR WITHOUT FEVER  NAUSEA AND VOMITING THAT IS NOT CONTROLLED WITH YOUR NAUSEA MEDICATION  *UNUSUAL SHORTNESS OF BREATH  *UNUSUAL BRUISING OR BLEEDING  TENDERNESS IN MOUTH AND THROAT WITH OR WITHOUT PRESENCE OF ULCERS  *URINARY PROBLEMS  *BOWEL PROBLEMS  UNUSUAL RASH Items with * indicate a potential emergency and should be followed up as soon as possible.  Feel free to call the clinic should you have any questions or concerns. The clinic phone number is [phone].  Please show the CHEMO ALERT CARD at check-in to the Emergency Department and triage nurse.   

## 2018-09-18 ENCOUNTER — Ambulatory Visit (HOSPITAL_COMMUNITY)
Admission: RE | Admit: 2018-09-18 | Discharge: 2018-09-18 | Disposition: A | Discharge status: Home / Self Care | Payer: 59 | Source: Ambulatory Visit | Attending: Hematology | Admitting: Hematology

## 2018-09-18 ENCOUNTER — Encounter (HOSPITAL_COMMUNITY): Payer: Self-pay

## 2018-09-18 DIAGNOSIS — C787 Secondary malignant neoplasm of liver and intrahepatic bile duct: Secondary | ICD-10-CM | POA: Diagnosis not present

## 2018-09-18 DIAGNOSIS — C50919 Malignant neoplasm of unspecified site of unspecified female breast: Secondary | ICD-10-CM | POA: Insufficient documentation

## 2018-09-18 MED ORDER — IOHEXOL 300 MG/ML  SOLN
100.0000 mL | Freq: Once | INTRAMUSCULAR | Status: AC | PRN
  Administered 2018-09-18: 100 mL via INTRAVENOUS

## 2018-09-18 MED ORDER — SODIUM CHLORIDE (PF) 0.9 % IJ SOLN
INTRAMUSCULAR | Status: AC
  Filled 2018-09-18: qty 50

## 2018-09-18 MED ORDER — HEPARIN SOD (PORK) LOCK FLUSH 100 UNIT/ML IV SOLN
INTRAVENOUS | Status: AC
  Administered 2018-09-18: 500 [IU] via INTRAVENOUS
  Filled 2018-09-18: qty 5

## 2018-09-18 MED ORDER — HEPARIN SOD (PORK) LOCK FLUSH 100 UNIT/ML IV SOLN
500.0000 [IU] | Freq: Once | INTRAVENOUS | Status: AC
  Administered 2018-09-18: 500 [IU] via INTRAVENOUS

## 2018-09-20 NOTE — Progress Notes (Signed)
[name]   [phone] [phone]   Clinic Follow up Note   Patient Care Team: Levin Bacon, NP as PCP - General (Family Medicine) [name], MD as Consulting Physician (Gastroenterology) [name], MD as Consulting Physician (Hematology)  Date of Service:  09/22/2018  CHIEF COMPLAINT: F/u for metastatic breast cancer  SUMMARY OF ONCOLOGIC HISTORY: Oncology History   Cancer Staging Metastatic breast cancer [facility]) Staging form: Breast, AJCC 8th Edition - Clinical stage from 03/24/2018: Stage IV (cT2, cN1, pM1, G3, ER+, PR+, HER2+) - Signed by [name], MD on 03/30/2018       Metastatic breast cancer ([place])   01/30/2018 Procedure    Colonoscopy showed small polyp in the sigmoid colon, removed, the exam of colon including the terminal ileum was otherwise negative.    01/30/2018 Procedure    EGD by Dr. Collene Mares showed small hiatal hernia, a 8 mm polypoid lesion in the cardia, biopsied.    03/09/2018 Imaging    03/09/2018 US Abdomen IMPRESSION: 1. Mass lesions throughout the liver, consistent with metastatic disease. Liver as a somewhat nodular contour suggesting underlying hepatic cirrhosis. Inhomogeneous echotexture to the liver.  2. Cholelithiasis with mild gallbladder wall thickening. A degree of cholecystitis cannot be excluded by ultrasound.  3. Portions of pancreas obscured by gas. Visualized portions of pancreas appear normal.  4. Small right kidney. Etiology uncertain. This finding potentially may be indicative of renal artery stenosis. In this regard, question whether patient is hypertensive.    03/15/2018 Imaging    CT CAP with contrast  IMPRESSION: 1. Widespread hepatic metastasis. 2. 2.6 cm lateral right breast soft tissue nodule could represent a breast primary or an incidental benign lesion. Consider correlation with mammogram and ultrasound. 3. No definite source of primary malignancy identified within the abdomen or pelvis.  There is possible rectosigmoid junction wall thickening. Consider colonoscopy with attention to this area. 4. Distal esophageal wall thickening, suggesting esophagitis.    03/24/2018 Cancer Staging    Staging form: Breast, AJCC 8th Edition - Clinical stage from 03/24/2018: Stage IV (cT2, cN1, pM1, G3, ER+, PR+, HER2+) - Signed by [name], MD on 03/30/2018    03/24/2018 Initial Biopsy    Diagnosis 1. 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 Breast, right, needle core biopsy, 9 o'clock, 7cm from nipple - INVASIVE DUCTAL CARCINOMA. -The carcinoma is somewhat morphologically dissimilar from that in part 1. It appears grade III 3. Lymph node, needle/core biopsy, right axillary - METASTATIC CARCINOMA IN 1 OF 1 LYMPH NODE (1/1).    03/24/2018 Receptors her2    Breast biopsy: 1. Estrogen Receptor: 40%, POSITIVE, STRONG-MODERATE STAINING INTENSITY Progesterone Receptor: 70%, POSITIVE, STRONG STAINING INTENSITY Proliferation Marker Ki67: 20% HER 2 equivocal by IHC 2+, POSITIVE by FISH, ratio 2.4 and copy #4.2  2. Estrogen Receptor: 60%, POSITIVE, MODERATE STAINING INTENSITY Progesterone Receptor: 40%, POSITIVE, MODERATE STAINING INTENSITY Proliferation Marker Ki67: 20% HER2 (+) by IHC 3+    03/24/2018 Initial Diagnosis    Metastatic breast cancer ([place])    03/27/2018 Pathology Results    Diagnosis Liver, needle/core biopsy, Right - METASTATIC CARCINOMA TO LIVER, CONSISTENT WITH PATIENTS CLINICAL HISTORY OF PRIMARY BREAST CARCINOMA.  ER 80%+ PR40%+ HER2- (by [facility], IHC 2+)  Ki67 50%     03/28/2018 Pathology Results    03/28/2018 Surgical Pathology Diagnosis 1. Breast, left, needle core biopsy, 9 o'clock - FIBROCYSTIC CHANGES. - THERE  IS NO EVIDENCE OF MALIGNANCY. 2. Breast, left, needle core biopsy, 2 o'clock - FIBROADENOMA. - THERE IS NO EVIDENCE OF MALIGNANCY. - SEE COMMENT.    03/29/2018  Imaging    03/29/2018 Bone Scan IMPRESSION: No scintigraphic evidence of osseous metastatic disease.    03/30/2018 Imaging    Bone scan  IMPRESSION: No scintigraphic evidence of osseous metastatic disease.     04/07/2018 -  Chemotherapy    First line chemo weekly Taxol and herceptin/Perjeta every 3 weeks starting 04/07/18. She developed infusion reaction to taxol and it was discontinued. Added Abraxane on C1D8, 2 weeks on/1 week off    06/06/2018 Imaging    CT CAP IMPRESSION: 1. Generally improved appearance, with reduced axillary adenopathy and reduced enhancing component of the hepatic masses, with some of the hepatic mass is moderately smaller than on the prior exam. Reduced size of the right lateral breast mass compared to the prior 03/15/2018 exam. 2. New mild interstitial accentuation in the lungs, significance uncertain. Part of this appearance may be due to lower lung volumes on today's exam. 3. Mild wall thickening in the descending colon and upper rectum suggesting low-grade colitis/inflammation. Prominent stool throughout the colon favors constipation. 4. Other imaging findings of potential clinical significance: Aortic Atherosclerosis (ICD10-I70.0). Mild cardiomegaly. Mild nodularity in the right lower lobe appears stable. Contracted and thick-walled gallbladder.     09/18/2018 Imaging    CT CAP W Contrast 09/18/18  IMPRESSION: 1. Liver metastases have decreased in size. 2. Right breast mass, mild right axillary lymphadenopathy and scattered tiny right pulmonary nodules are all stable. 3. New mild left supraclavicular and left subpectoral lymphadenopathy, can not exclude progression of metastatic nodal disease. 4. Moderate colorectal stool volume, which may indicate constipation. 5.  Aortic Atherosclerosis (ICD10-I70.0).      CURRENT THERAPY:  First line weekly TaxolwithHerceptin/Perjeta every 3 weeks starting 04/07/18. She developed infusion reaction to taxol and it was  discontinued. Changed to Abraxane weekly2 weeks on/1 week off from 07/21/2018  INTERVAL HISTORY:  Teresa Hays is here for a follow up and ongoing treatment. She presents to the clinic today by herself.  She is doing well overall, however she reports worsening neuropathy in her feet.  She has moderate tingling on bilateral feet especially at night, and she can sleep well.  She has mild numbness and tingling in her hands, but does not impact her hand function.  No balance issue.  She is otherwise doing well, with good appetite and energy level, abdominal pain resolved, no other complaints.  REVIEW OF SYSTEMS:   Constitutional: Denies fevers, chills or abnormal weight loss Eyes: Denies blurriness of vision Ears, nose, mouth, throat, and face: Denies mucositis or sore throat Respiratory: Denies cough, dyspnea or wheezes Cardiovascular: Denies palpitation, chest discomfort or lower extremity swelling Gastrointestinal:  Denies nausea, heartburn or change in bowel habits Skin: Denies abnormal skin rashes Lymphatics: Denies new lymphadenopathy or easy bruising Neurological:(+) numbness, tingling on feet Behavioral/Psych: Mood is stable, no new changes  All other systems were reviewed with the patient and are negative.  MEDICAL HISTORY:  Past Medical History:  Diagnosis Date  . GERD (gastroesophageal reflux disease)   . Hypertension   . rt breast ca with mets to liver dx'd 03/2018    SURGICAL HISTORY: Past Surgical History:  Procedure Laterality Date  . COLONOSCOPY    . ESOPHAGOGASTRODUODENOSCOPY ENDOSCOPY    . IR IMAGING GUIDED PORT INSERTION  04/04/2018    I have reviewed the social history and family  history with the patient and they are unchanged from previous note.  ALLERGIES:  has No Known Allergies.  MEDICATIONS:  Current Outpatient Medications  Medication Sig Dispense Refill  . amLODipine (NORVASC) 5 MG tablet Take 5 mg by mouth daily.     Marland Kitchen atenolol (TENORMIN) 50 MG  tablet Take 50 mg by mouth daily.     . diphenoxylate-atropine (LOMOTIL) 2.5-0.025 MG tablet Take 2 tablets by mouth 4 (four) times daily as needed for diarrhea or loose stools. 30 tablet 0  . gabapentin (NEURONTIN) 100 MG capsule Take 1 capsule (100 mg total) by mouth 3 (three) times daily. 90 capsule 0  . lidocaine-prilocaine (EMLA) cream Apply to affected area once 30 g 3  . LORazepam (ATIVAN) 0.5 MG tablet Take 1 tablet (0.5 mg total) by mouth at bedtime as needed for anxiety. 30 tablet 0  . omeprazole (PRILOSEC) 20 MG capsule Take 1 capsule (20 mg total) by mouth daily. 30 capsule 3  . ondansetron (ZOFRAN) 8 MG tablet Take 1 tablet (8 mg total) by mouth 2 (two) times daily as needed (Nausea or vomiting). 30 tablet 1  . potassium chloride SA (K-DUR,KLOR-CON) 20 MEQ tablet Take 1 tablet (20 mEq total) by mouth daily. 30 tablet 1  . rivaroxaban (XARELTO) 20 MG TABS tablet Take 1 tablet (20 mg total) by mouth daily with supper. 30 tablet 1  . traMADol (ULTRAM) 50 MG tablet Take 1 tablet (50 mg total) by mouth every 6 (six) hours as needed for moderate pain or severe pain. 30 tablet 1   No current facility-administered medications for this visit.     PHYSICAL EXAMINATION: ECOG PERFORMANCE STATUS: 1 - Symptomatic but completely ambulatory  Vitals:   09/22/18 0808  BP: 135/81  Pulse: 60  Resp: 18  Temp: 98.7 F (37.1 C)  SpO2: 100%   Filed Weights   09/22/18 0808  Weight: 136 lb 4.8 oz (61.8 kg)    GENERAL:alert, no distress and comfortable SKIN: skin color, texture, turgor are normal, no rashes or significant lesions EYES: normal, Conjunctiva are pink and non-injected, sclera clear OROPHARYNX:no exudate, no erythema and lips, buccal mucosa, and tongue normal  NECK: supple, thyroid normal size, non-tender, without nodularity LYMPH:  no palpable lymphadenopathy in the cervical, axillary or inguinal LUNGS: clear to auscultation and percussion with normal breathing effort HEART:  regular rate & rhythm and no murmurs and no lower extremity edema ABDOMEN:abdomen soft, non-tender and normal bowel sounds Musculoskeletal:no cyanosis of digits and no clubbing  NEURO: alert & oriented x 3 with fluent speech, no focal motor/sensory deficits, except a modest decrease of vibration sensation lower extremity  LABORATORY DATA:  I have reviewed the data as listed CBC Latest Ref Rng & Units 09/22/2018 09/12/2018 09/01/2018  WBC 4.0 - 10.5 K/uL 5.5 6.8 5.1  Hemoglobin 12.0 - 15.0 g/dL 9.6(L) 10.0(L) 9.6(L)  Hematocrit 36.0 - 46.0 % 30.2(L) 32.4(L) 30.9(L)  Platelets 150 - 400 K/uL 283 300 251     CMP Latest Ref Rng & Units 09/12/2018 09/01/2018 08/18/2018  Glucose 70 - 99 mg/dL 101(H) 103(H) 93  BUN 8 - 23 mg/dL '14 11 10  '$ Creatinine 0.44 - 1.00 mg/dL 1.01(H) 1.00 0.88  Sodium 135 - 145 mmol/L 139 138 139  Potassium 3.5 - 5.1 mmol/L 3.8 3.7 3.6  Chloride 98 - 111 mmol/L 106 105 106  CO2 22 - 32 mmol/L '25 23 24  '$ Calcium 8.9 - 10.3 mg/dL 9.4 9.1 9.0  Total Protein 6.5 - 8.1 g/dL 7.2  6.9 6.2(L)  Total Bilirubin 0.3 - 1.2 mg/dL 0.3 0.4 0.3  Alkaline Phos 38 - 126 U/L 91 90 84  AST 15 - 41 U/L '26 21 22  '$ ALT 0 - 44 U/L '13 11 13      '$ RADIOGRAPHIC STUDIES: I have personally reviewed the radiological images as listed and agreed with the findings in the report. No results found.   ASSESSMENT & PLAN:  Teresa Hays is a 64 y.o. female with   1. Metastatic right breast cancer to liver, cT2N1M1, stage IV, ER+/PR+/HER2+, Liver mets ER+/PR+/HER2- -She was diagnosed in 03/2018.She presented with diffuse liver metastasis, tworight breast massesand right axillary adenopathy. Breast mass biopsy showed ER PR positive, but 1 was HER-2 positive, the other one was HER-2 negative. Liver met was HER2-.  -Given the metastatic disease, her cancer is not curable but still treatable. She is currently on First-line Herceptin/Perjetaevery 3 weeks and weekly Taxol. Due to infusionreaction,  taxol was switched to Abraxane. She is currently  for 2 weeks on/1 week off.Tolerating well.  -Her right breast mass is no longer palpable on exam, she continues to show good response. -We discussed her CT CAP from 09/18/18 which showed excellent partial response in her liver metastasis, with significant decrease in the size and number of liver metastasis.  She has a mildly enlarged left supraclavicular lymph node, likely related to her recent left upper extremity DVT.  No other new metastasis on the scan. -Given her good response to chemotherapy, I recommend her to continue Abraxane for additional 2 to 3 months, before we switch to oral antiestrogen therapy.  I will decrease her Abraxane to 60 mg/m, due to her neuropathy.  We will continue 2 weeks on, 1 week off.  Continue Herceptin and Perjeta. -Lab reviewed, adequate for treatment, will proceed Abraxane, Herceptin and Perjeta today  2.  Peripheral neuropathy, grade 2 -Secondary chemotherapy, mainly in her feet -I will start her on gabapentin 100 mg at night, and gradually increase dose to 300 2-3 times a day.  Potential benefit and side effects, especially drowsiness, discussed with patient.  She agrees to proceed.  3.Left UEDVT -Shehas recent swelling concerning for DVT on exam. 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 -Her left arm edema has resolved  -On Xarelto '20mg'$  today. Will continue anticoagulation indefinitely, unless she has significant bleeding or other side effects.  4. Upper abdominal and back pain -She has been on Tramadol half tablets every 6 hours as needed -muchImproved with chemo, near resolved   5. Anemia -she developed worseninganemia since she started chemotherapy -Continue monitoring, will consider blood transfusion if hemoglobin less than 8, always  symptomatic anemia.stable lately    6 Social support  -She lives alone, has her sister and niece in [place]. Her daughter lives in a few hours away  -On Ativan PRN due to anxietyabout diagnosis and treatment  7. Goal of care discussion  -she is full code -She understands the goal of care is palliative, to prolong her life, and improve her quality of life.   Plan: -Restaging CT scan reviewed, she has had excellent partial response, will continue Abraxane, Herceptin and Perjeta.  Due to her neuropathy, I will reduce Abraxane dose to 60 mg/m -Follow-up in 3 weeks    No problem-specific Assessment & Plan notes found for this encounter.   No orders  of the defined types were placed in this encounter.  All questions were answered. The patient knows to call the clinic with any problems, questions or concerns. No barriers to learning was detected. I spent 20 minutes counseling the patient face to face. The total time spent in the appointment was 25 minutes and more than 50% was on counseling and review of test results     [name], MD 09/22/2018   I, Joslyn Devon, am acting as scribe for [name], MD.   I have reviewed the above documentation for accuracy and completeness, and I agree with the above.

## 2018-09-22 ENCOUNTER — Inpatient Hospital Stay: Payer: 59 | Admitting: General Practice

## 2018-09-22 ENCOUNTER — Encounter: Payer: Self-pay | Admitting: General Practice

## 2018-09-22 ENCOUNTER — Inpatient Hospital Stay: Payer: 59

## 2018-09-22 ENCOUNTER — Telehealth: Payer: Self-pay

## 2018-09-22 ENCOUNTER — Inpatient Hospital Stay: Payer: 59 | Attending: Hematology | Admitting: Hematology

## 2018-09-22 ENCOUNTER — Encounter: Payer: Self-pay | Admitting: Hematology

## 2018-09-22 VITALS — BP 135/81 | HR 60 | Temp 98.7°F | Resp 18 | Ht 64.0 in | Wt 136.3 lb

## 2018-09-22 DIAGNOSIS — D649 Anemia, unspecified: Secondary | ICD-10-CM | POA: Diagnosis not present

## 2018-09-22 DIAGNOSIS — F064 Anxiety disorder due to known physiological condition: Secondary | ICD-10-CM | POA: Diagnosis not present

## 2018-09-22 DIAGNOSIS — Z5111 Encounter for antineoplastic chemotherapy: Secondary | ICD-10-CM | POA: Diagnosis not present

## 2018-09-22 DIAGNOSIS — G620 Drug-induced polyneuropathy: Secondary | ICD-10-CM | POA: Diagnosis not present

## 2018-09-22 DIAGNOSIS — Z7189 Other specified counseling: Secondary | ICD-10-CM

## 2018-09-22 DIAGNOSIS — Z7901 Long term (current) use of anticoagulants: Secondary | ICD-10-CM | POA: Diagnosis not present

## 2018-09-22 DIAGNOSIS — I82622 Acute embolism and thrombosis of deep veins of left upper extremity: Secondary | ICD-10-CM | POA: Diagnosis not present

## 2018-09-22 DIAGNOSIS — C50919 Malignant neoplasm of unspecified site of unspecified female breast: Secondary | ICD-10-CM

## 2018-09-22 DIAGNOSIS — T451X5A Adverse effect of antineoplastic and immunosuppressive drugs, initial encounter: Secondary | ICD-10-CM

## 2018-09-22 DIAGNOSIS — C50911 Malignant neoplasm of unspecified site of right female breast: Secondary | ICD-10-CM | POA: Insufficient documentation

## 2018-09-22 DIAGNOSIS — C787 Secondary malignant neoplasm of liver and intrahepatic bile duct: Secondary | ICD-10-CM | POA: Diagnosis not present

## 2018-09-22 DIAGNOSIS — Z5112 Encounter for antineoplastic immunotherapy: Secondary | ICD-10-CM | POA: Diagnosis not present

## 2018-09-22 DIAGNOSIS — Z95828 Presence of other vascular implants and grafts: Secondary | ICD-10-CM

## 2018-09-22 DIAGNOSIS — D6481 Anemia due to antineoplastic chemotherapy: Secondary | ICD-10-CM

## 2018-09-22 LAB — CBC WITH DIFFERENTIAL (CANCER CENTER ONLY)
Abs Immature Granulocytes: 0.02 10*3/uL (ref 0.00–0.07)
Basophils Absolute: 0 10*3/uL (ref 0.0–0.1)
Basophils Relative: 1 %
Eosinophils Absolute: 0.2 10*3/uL (ref 0.0–0.5)
Eosinophils Relative: 3 %
HCT: 30.2 % — ABNORMAL LOW (ref 36.0–46.0)
Hemoglobin: 9.6 g/dL — ABNORMAL LOW (ref 12.0–15.0)
Immature Granulocytes: 0 %
Lymphocytes Relative: 32 %
Lymphs Abs: 1.8 10*3/uL (ref 0.7–4.0)
MCH: 28.3 pg (ref 26.0–34.0)
MCHC: 31.8 g/dL (ref 30.0–36.0)
MCV: 89.1 fL (ref 80.0–100.0)
Monocytes Absolute: 0.2 10*3/uL (ref 0.1–1.0)
Monocytes Relative: 4 %
Neutro Abs: 3.3 10*3/uL (ref 1.7–7.7)
Neutrophils Relative %: 60 %
Platelet Count: 283 10*3/uL (ref 150–400)
RBC: 3.39 MIL/uL — ABNORMAL LOW (ref 3.87–5.11)
RDW: 15.4 % (ref 11.5–15.5)
WBC Count: 5.5 10*3/uL (ref 4.0–10.5)
nRBC: 0 % (ref 0.0–0.2)

## 2018-09-22 LAB — COMPREHENSIVE METABOLIC PANEL
ALT: 16 U/L (ref 0–44)
AST: 26 U/L (ref 15–41)
Albumin: 3.6 g/dL (ref 3.5–5.0)
Alkaline Phosphatase: 85 U/L (ref 38–126)
Anion gap: 7 (ref 5–15)
BUN: 16 mg/dL (ref 8–23)
CO2: 25 mmol/L (ref 22–32)
Calcium: 9.3 mg/dL (ref 8.9–10.3)
Chloride: 107 mmol/L (ref 98–111)
Creatinine, Ser: 1.03 mg/dL — ABNORMAL HIGH (ref 0.44–1.00)
GFR calc Af Amer: >60 mL/min (ref >60)
GFR calc non Af Amer: 58 mL/min — ABNORMAL LOW (ref >60)
Glucose, Bld: 86 mg/dL (ref 70–99)
Potassium: 3.6 mmol/L (ref 3.5–5.1)
Sodium: 139 mmol/L (ref 135–145)
Total Bilirubin: 0.3 mg/dL (ref 0.3–1.2)
Total Protein: 6.6 g/dL (ref 6.5–8.1)

## 2018-09-22 MED ORDER — TRASTUZUMAB CHEMO 150 MG IV SOLR
6.0000 mg/kg | Freq: Once | INTRAVENOUS | Status: AC
  Administered 2018-09-22: 399 mg via INTRAVENOUS
  Filled 2018-09-22: qty 19

## 2018-09-22 MED ORDER — HEPARIN SOD (PORK) LOCK FLUSH 100 UNIT/ML IV SOLN
500.0000 [IU] | Freq: Once | INTRAVENOUS | Status: AC | PRN
  Administered 2018-09-22: 500 [IU]
  Filled 2018-09-22: qty 5

## 2018-09-22 MED ORDER — ACETAMINOPHEN 325 MG PO TABS
ORAL_TABLET | ORAL | Status: AC
  Filled 2018-09-22: qty 2

## 2018-09-22 MED ORDER — ACETAMINOPHEN 325 MG PO TABS
650.0000 mg | ORAL_TABLET | Freq: Once | ORAL | Status: AC
  Administered 2018-09-22: 650 mg via ORAL

## 2018-09-22 MED ORDER — ONDANSETRON HCL 8 MG PO TABS
ORAL_TABLET | ORAL | Status: AC
  Filled 2018-09-22: qty 1

## 2018-09-22 MED ORDER — SODIUM CHLORIDE 0.9 % IV SOLN
Freq: Once | INTRAVENOUS | Status: AC
  Administered 2018-09-22: 10:00:00 via INTRAVENOUS
  Filled 2018-09-22: qty 250

## 2018-09-22 MED ORDER — SODIUM CHLORIDE 0.9% FLUSH
10.0000 mL | INTRAVENOUS | Status: DC | PRN
  Administered 2018-09-22: 10 mL
  Filled 2018-09-22: qty 10

## 2018-09-22 MED ORDER — PACLITAXEL PROTEIN-BOUND CHEMO INJECTION 100 MG
60.0000 mg/m2 | Freq: Once | INTRAVENOUS | Status: AC
  Administered 2018-09-22: 100 mg via INTRAVENOUS
  Filled 2018-09-22: qty 20

## 2018-09-22 MED ORDER — PROCHLORPERAZINE MALEATE 10 MG PO TABS
10.0000 mg | ORAL_TABLET | Freq: Once | ORAL | Status: AC
  Administered 2018-09-22: 10 mg via ORAL

## 2018-09-22 MED ORDER — PROCHLORPERAZINE MALEATE 10 MG PO TABS
ORAL_TABLET | ORAL | Status: AC
  Filled 2018-09-22: qty 1

## 2018-09-22 MED ORDER — GABAPENTIN 100 MG PO CAPS: 100.0000 mg | ORAL_CAPSULE | Freq: Three times a day (TID) | ORAL | 0 refills | Status: DC

## 2018-09-22 MED ORDER — ONDANSETRON HCL 8 MG PO TABS
8.0000 mg | ORAL_TABLET | Freq: Once | ORAL | Status: AC
  Administered 2018-09-22: 8 mg via ORAL

## 2018-09-22 MED ORDER — OMEPRAZOLE 20 MG PO CPDR: 20.0000 mg | DELAYED_RELEASE_CAPSULE | Freq: Every day | ORAL | 3 refills | Status: DC

## 2018-09-22 MED ORDER — SODIUM CHLORIDE 0.9 % IV SOLN
420.0000 mg | Freq: Once | INTRAVENOUS | Status: AC
  Administered 2018-09-22: 420 mg via INTRAVENOUS
  Filled 2018-09-22: qty 14

## 2018-09-22 MED ORDER — DIPHENHYDRAMINE HCL 50 MG/ML IJ SOLN
INTRAMUSCULAR | Status: AC
  Filled 2018-09-22: qty 1

## 2018-09-22 MED ORDER — DIPHENHYDRAMINE HCL 50 MG/ML IJ SOLN
50.0000 mg | Freq: Once | INTRAMUSCULAR | Status: AC
  Administered 2018-09-22: 50 mg via INTRAVENOUS

## 2018-09-22 NOTE — Telephone Encounter (Signed)
Dropped off FMLA papers in [place] to be completed.

## 2018-09-22 NOTE — Patient Instructions (Signed)
[facility] Discharge Instructions for Patients Receiving Chemotherapy  Today you received the following chemotherapy agents:  Perjeta, Herceptin, Abraxane  To help prevent nausea and vomiting after your treatment, we encourage you to take your nausea medication as prescribed.   If you develop nausea and vomiting that is not controlled by your nausea medication, call the clinic.   BELOW ARE SYMPTOMS THAT SHOULD BE REPORTED IMMEDIATELY:  *FEVER GREATER THAN 100.5 F  *CHILLS WITH OR WITHOUT FEVER  NAUSEA AND VOMITING THAT IS NOT CONTROLLED WITH YOUR NAUSEA MEDICATION  *UNUSUAL SHORTNESS OF BREATH  *UNUSUAL BRUISING OR BLEEDING  TENDERNESS IN MOUTH AND THROAT WITH OR WITHOUT PRESENCE OF ULCERS  *URINARY PROBLEMS  *BOWEL PROBLEMS  UNUSUAL RASH Items with * indicate a potential emergency and should be followed up as soon as possible.  Feel free to call the clinic should you have any questions or concerns. The clinic phone number is [phone].  Please show the [place] at check-in to the Emergency Department and triage nurse.

## 2018-09-22 NOTE — Progress Notes (Signed)
[facility] CSW Progress Notes  Location manager submitted to [employer], [place] and [employer].  Patient needs to provide additional documentation for application for [facility], patient aware.  Edwyna Shell, LCSW Clinical Social Worker Phone:  [phone]

## 2018-09-22 NOTE — Progress Notes (Signed)
[place] CSW Progress Notes  Visit w patient in infusion - received paperwork for financial assistance applications, will review and submit as able, depending on paperwork received. Will let patient know today if other documents are needed to substantiate applications.    Edwyna Shell, LCSW Clinical Social Worker Phone:  [phone]

## 2018-09-25 ENCOUNTER — Ambulatory Visit: Payer: 59 | Admitting: Cardiology

## 2018-09-26 ENCOUNTER — Telehealth: Payer: Self-pay | Admitting: Hematology

## 2018-09-26 ENCOUNTER — Telehealth: Payer: Self-pay | Admitting: *Deleted

## 2018-09-26 NOTE — Telephone Encounter (Signed)
Spoke with patient and confirmed all of the rides that I have scheduled for her through 4/24.

## 2018-09-26 NOTE — Telephone Encounter (Signed)
Received vm call from pt stating that her bills were not being paid b/c a diagnosis was not given.  Returned call & informed to call ph # on her bill to discuss.  She informed me that she has a sty on her eye & wanted to know what caused it.  Informed that bacteria causes this usually & instructed to wash bid with [name] baby shampoo & do warm moist compresses qid & if not better to let us know.  She expressed understanding & Dr Burr Medico agrees with plan.

## 2018-09-29 ENCOUNTER — Inpatient Hospital Stay: Payer: 59

## 2018-09-29 ENCOUNTER — Telehealth: Payer: Self-pay

## 2018-09-29 ENCOUNTER — Other Ambulatory Visit: Payer: Self-pay

## 2018-09-29 ENCOUNTER — Encounter: Payer: Self-pay | Admitting: General Practice

## 2018-09-29 ENCOUNTER — Inpatient Hospital Stay: Payer: 59 | Admitting: General Practice

## 2018-09-29 VITALS — BP 128/78 | HR 59 | Temp 98.0°F | Resp 17

## 2018-09-29 DIAGNOSIS — C50911 Malignant neoplasm of unspecified site of right female breast: Secondary | ICD-10-CM | POA: Diagnosis not present

## 2018-09-29 DIAGNOSIS — C787 Secondary malignant neoplasm of liver and intrahepatic bile duct: Secondary | ICD-10-CM | POA: Diagnosis not present

## 2018-09-29 DIAGNOSIS — Z5112 Encounter for antineoplastic immunotherapy: Secondary | ICD-10-CM | POA: Diagnosis not present

## 2018-09-29 DIAGNOSIS — C50919 Malignant neoplasm of unspecified site of unspecified female breast: Secondary | ICD-10-CM

## 2018-09-29 DIAGNOSIS — Z7189 Other specified counseling: Secondary | ICD-10-CM

## 2018-09-29 DIAGNOSIS — Z95828 Presence of other vascular implants and grafts: Secondary | ICD-10-CM

## 2018-09-29 DIAGNOSIS — Z5111 Encounter for antineoplastic chemotherapy: Secondary | ICD-10-CM | POA: Diagnosis not present

## 2018-09-29 LAB — CBC WITH DIFFERENTIAL (CANCER CENTER ONLY)
Abs Immature Granulocytes: 0.01 10*3/uL (ref 0.00–0.07)
Basophils Absolute: 0.1 10*3/uL (ref 0.0–0.1)
Basophils Relative: 1 %
Eosinophils Absolute: 0.1 10*3/uL (ref 0.0–0.5)
Eosinophils Relative: 3 %
HCT: 29.4 % — ABNORMAL LOW (ref 36.0–46.0)
Hemoglobin: 9.2 g/dL — ABNORMAL LOW (ref 12.0–15.0)
Immature Granulocytes: 0 %
Lymphocytes Relative: 41 %
Lymphs Abs: 1.7 10*3/uL (ref 0.7–4.0)
MCH: 28.1 pg (ref 26.0–34.0)
MCHC: 31.3 g/dL (ref 30.0–36.0)
MCV: 89.9 fL (ref 80.0–100.0)
Monocytes Absolute: 0.2 10*3/uL (ref 0.1–1.0)
Monocytes Relative: 4 %
Neutro Abs: 2 10*3/uL (ref 1.7–7.7)
Neutrophils Relative %: 51 %
Platelet Count: 222 10*3/uL (ref 150–400)
RBC: 3.27 MIL/uL — ABNORMAL LOW (ref 3.87–5.11)
RDW: 15.7 % — ABNORMAL HIGH (ref 11.5–15.5)
WBC Count: 4.1 10*3/uL (ref 4.0–10.5)
nRBC: 0 % (ref 0.0–0.2)

## 2018-09-29 LAB — CMP (CANCER CENTER ONLY)
ALT: 11 U/L (ref 0–44)
AST: 22 U/L (ref 15–41)
Albumin: 3.7 g/dL (ref 3.5–5.0)
Alkaline Phosphatase: 91 U/L (ref 38–126)
Anion gap: 8 (ref 5–15)
BUN: 10 mg/dL (ref 8–23)
CO2: 24 mmol/L (ref 22–32)
Calcium: 9.3 mg/dL (ref 8.9–10.3)
Chloride: 107 mmol/L (ref 98–111)
Creatinine: 0.97 mg/dL (ref 0.44–1.00)
GFR, Est AFR Am: >60 mL/min (ref >60)
GFR, Estimated: >60 mL/min (ref >60)
Glucose, Bld: 93 mg/dL (ref 70–99)
Potassium: 4.2 mmol/L (ref 3.5–5.1)
Sodium: 139 mmol/L (ref 135–145)
Total Bilirubin: 0.5 mg/dL (ref 0.3–1.2)
Total Protein: 7 g/dL (ref 6.5–8.1)

## 2018-09-29 MED ORDER — PACLITAXEL PROTEIN-BOUND CHEMO INJECTION 100 MG
60.0000 mg/m2 | Freq: Once | INTRAVENOUS | Status: AC
  Administered 2018-09-29: 100 mg via INTRAVENOUS
  Filled 2018-09-29: qty 20

## 2018-09-29 MED ORDER — HEPARIN SOD (PORK) LOCK FLUSH 100 UNIT/ML IV SOLN
500.0000 [IU] | Freq: Once | INTRAVENOUS | Status: AC | PRN
  Administered 2018-09-29: 500 [IU]
  Filled 2018-09-29: qty 5

## 2018-09-29 MED ORDER — SODIUM CHLORIDE 0.9 % IV SOLN
Freq: Once | INTRAVENOUS | Status: AC
  Administered 2018-09-29: 11:00:00 via INTRAVENOUS
  Filled 2018-09-29: qty 250

## 2018-09-29 MED ORDER — ONDANSETRON HCL 8 MG PO TABS
ORAL_TABLET | ORAL | Status: AC
  Filled 2018-09-29: qty 1

## 2018-09-29 MED ORDER — PROCHLORPERAZINE MALEATE 10 MG PO TABS
10.0000 mg | ORAL_TABLET | Freq: Once | ORAL | Status: AC
  Administered 2018-09-29: 10 mg via ORAL

## 2018-09-29 MED ORDER — PROCHLORPERAZINE MALEATE 10 MG PO TABS
ORAL_TABLET | ORAL | Status: AC
  Filled 2018-09-29: qty 1

## 2018-09-29 MED ORDER — SODIUM CHLORIDE 0.9% FLUSH
10.0000 mL | INTRAVENOUS | Status: DC | PRN
  Administered 2018-09-29: 10 mL
  Filled 2018-09-29: qty 10

## 2018-09-29 MED ORDER — ONDANSETRON HCL 8 MG PO TABS
8.0000 mg | ORAL_TABLET | Freq: Once | ORAL | Status: AC
  Administered 2018-09-29: 8 mg via ORAL

## 2018-09-29 NOTE — Progress Notes (Signed)
[facility] CSW Progress Notes  Visit w patient in infusion to assess progress towards completion of financial aid applications.  Pt provided letter from [place] in [place] w additional documents needed - CSW assembled and faxed these documents to [place] in [place].  CSW provided patient w information from HR about how to request letter verifying her last day of work.  Patient will contact them and provide letter to [place] - this is required document for completion of [place] application.    Edwyna Shell, LCSW Clinical Social Worker Phone:  [phone]

## 2018-09-29 NOTE — Telephone Encounter (Signed)
Dropped off patient's daughter Shellee Milo FMLA papers to [employer] office.

## 2018-09-29 NOTE — Patient Instructions (Signed)
[facility] Discharge Instructions for Patients Receiving Chemotherapy  Today you received the following chemotherapy agents:  Abraxane.  To help prevent nausea and vomiting after your treatment, we encourage you to take your nausea medication as directed.   If you develop nausea and vomiting that is not controlled by your nausea medication, call the clinic.   BELOW ARE SYMPTOMS THAT SHOULD BE REPORTED IMMEDIATELY:  *FEVER GREATER THAN 100.5 F  *CHILLS WITH OR WITHOUT FEVER  NAUSEA AND VOMITING THAT IS NOT CONTROLLED WITH YOUR NAUSEA MEDICATION  *UNUSUAL SHORTNESS OF BREATH  *UNUSUAL BRUISING OR BLEEDING  TENDERNESS IN MOUTH AND THROAT WITH OR WITHOUT PRESENCE OF ULCERS  *URINARY PROBLEMS  *BOWEL PROBLEMS  UNUSUAL RASH Items with * indicate a potential emergency and should be followed up as soon as possible.  Feel free to call the clinic should you have any questions or concerns. The clinic phone number is [phone].  Please show the CHEMO ALERT CARD at check-in to the Emergency Department and triage nurse.   

## 2018-09-30 ENCOUNTER — Other Ambulatory Visit: Payer: Self-pay | Admitting: Hematology

## 2018-09-30 DIAGNOSIS — E876 Hypokalemia: Secondary | ICD-10-CM

## 2018-09-30 LAB — CANCER ANTIGEN 27.29: CA 27.29: 60 U/mL — ABNORMAL HIGH (ref 0.0–38.6)

## 2018-10-02 MED FILL — XARELTO 20 MG TABLET: 20 | 30 days supply | Qty: 30 | Fill #0

## 2018-10-02 MED FILL — SHIPPING COST: 1 days supply | Qty: 1 | Fill #0

## 2018-10-03 ENCOUNTER — Telehealth: Payer: Self-pay

## 2018-10-03 ENCOUNTER — Other Ambulatory Visit: Payer: Self-pay

## 2018-10-03 DIAGNOSIS — C787 Secondary malignant neoplasm of liver and intrahepatic bile duct: Secondary | ICD-10-CM

## 2018-10-03 MED ORDER — GABAPENTIN 100 MG PO CAPS: 200.0000 mg | ORAL_CAPSULE | Freq: Three times a day (TID) | ORAL | 0 refills | Status: DC

## 2018-10-03 NOTE — Telephone Encounter (Signed)
Spoke to patient per Dr. Burr Medico instructed to increase Gabapentin to 200 mg tid x 1 week, then if tolerating increase to 300 mg tid after that.  Will send in another script to [facility] on [address].

## 2018-10-03 NOTE — Telephone Encounter (Signed)
Patient calls stating her neuropathy is getting worse, currently taking Gabapentin 100 mg tid, she is asking if she can take more?  [phone]

## 2018-10-04 ENCOUNTER — Telehealth: Payer: Self-pay

## 2018-10-04 DIAGNOSIS — H5203 Hypermetropia, bilateral: Secondary | ICD-10-CM | POA: Diagnosis not present

## 2018-10-04 NOTE — Telephone Encounter (Signed)
Faxed script for dose change for gabapentin to [facility] on file.

## 2018-10-11 NOTE — Progress Notes (Signed)
[place]   Telephone:(336) [phone] Fax:(336) [phone]   Clinic Follow up Note   Patient Care Team: Levin Bacon, NP as PCP - General (Family Medicine) [name], MD as Consulting Physician (Gastroenterology) [name], MD as Consulting Physician (Hematology)  Date of Service:  10/13/2018  CHIEF COMPLAINT: F/u for metastatic breast cancer  SUMMARY OF ONCOLOGIC HISTORY: Oncology History   Cancer Staging Metastatic breast cancer [facility]) Staging form: Breast, AJCC 8th Edition - Clinical stage from 03/24/2018: Stage IV (cT2, cN1, pM1, G3, ER+, PR+, HER2+) - Signed by [name], MD on 03/30/2018       Metastatic breast cancer ([place])   01/30/2018 Procedure    Colonoscopy showed small polyp in the sigmoid colon, removed, the exam of colon including the terminal ileum was otherwise negative.    01/30/2018 Procedure    EGD by Dr. Collene Mares showed small hiatal hernia, a 8 mm polypoid lesion in the cardia, biopsied.    03/09/2018 Imaging    03/09/2018 US Abdomen IMPRESSION: 1. Mass lesions throughout the liver, consistent with metastatic disease. Liver as a somewhat nodular contour suggesting underlying hepatic cirrhosis. Inhomogeneous echotexture to the liver.  2. Cholelithiasis with mild gallbladder wall thickening. A degree of cholecystitis cannot be excluded by ultrasound.  3. Portions of pancreas obscured by gas. Visualized portions of pancreas appear normal.  4. Small right kidney. Etiology uncertain. This finding potentially may be indicative of renal artery stenosis. In this regard, question whether patient is hypertensive.    03/15/2018 Imaging    CT CAP with contrast  IMPRESSION: 1. Widespread hepatic metastasis. 2. 2.6 cm lateral right breast soft tissue nodule could represent a breast primary or an incidental benign lesion. Consider correlation with mammogram and ultrasound. 3. No definite source of primary malignancy identified within the abdomen or pelvis.  There is possible rectosigmoid junction wall thickening. Consider colonoscopy with attention to this area. 4. Distal esophageal wall thickening, suggesting esophagitis.    03/24/2018 Cancer Staging    Staging form: Breast, AJCC 8th Edition - Clinical stage from 03/24/2018: Stage IV (cT2, cN1, pM1, G3, ER+, PR+, HER2+) - Signed by [name], MD on 03/30/2018    03/24/2018 Initial Biopsy    Diagnosis 1. 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 Breast, right, needle core biopsy, 9 o'clock, 7cm from nipple - INVASIVE DUCTAL CARCINOMA. -The carcinoma is somewhat morphologically dissimilar from that in part 1. It appears grade III 3. Lymph node, needle/core biopsy, right axillary - METASTATIC CARCINOMA IN 1 OF 1 LYMPH NODE (1/1).    03/24/2018 Receptors her2    Breast biopsy: 1. Estrogen Receptor: 40%, POSITIVE, STRONG-MODERATE STAINING INTENSITY Progesterone Receptor: 70%, POSITIVE, STRONG STAINING INTENSITY Proliferation Marker Ki67: 20% HER 2 equivocal by IHC 2+, POSITIVE by FISH, ratio 2.4 and copy #4.2  2. Estrogen Receptor: 60%, POSITIVE, MODERATE STAINING INTENSITY Progesterone Receptor: 40%, POSITIVE, MODERATE STAINING INTENSITY Proliferation Marker Ki67: 20% HER2 (+) by IHC 3+    03/24/2018 Initial Diagnosis    Metastatic breast cancer ([place])    03/27/2018 Pathology Results    Diagnosis Liver, needle/core biopsy, Right - METASTATIC CARCINOMA TO LIVER, CONSISTENT WITH PATIENTS CLINICAL HISTORY OF PRIMARY BREAST CARCINOMA.  ER 80%+ PR40%+ HER2- (by [facility], IHC 2+)  Ki67 50%     03/28/2018 Pathology Results    03/28/2018 Surgical Pathology Diagnosis 1. Breast, left, needle core biopsy, 9 o'clock - FIBROCYSTIC CHANGES. - THERE  IS NO EVIDENCE OF MALIGNANCY. 2. Breast, left, needle core biopsy, 2 o'clock - FIBROADENOMA. - THERE IS NO EVIDENCE OF MALIGNANCY. - SEE COMMENT.    03/29/2018  Imaging    03/29/2018 Bone Scan IMPRESSION: No scintigraphic evidence of osseous metastatic disease.    03/30/2018 Imaging    Bone scan  IMPRESSION: No scintigraphic evidence of osseous metastatic disease.     04/07/2018 -  Chemotherapy    First line chemo weekly Taxol and herceptin/Perjeta every 3 weeks starting 04/07/18. She developed infusion reaction to taxol and it was discontinued. Added Abraxane on C1D8, 2 weeks on/1 week off.  Abraxne dose reduced to 77m/m2 every 3 weeks starting with cycle 10    06/06/2018 Imaging    CT CAP IMPRESSION: 1. Generally improved appearance, with reduced axillary adenopathy and reduced enhancing component of the hepatic masses, with some of the hepatic mass is moderately smaller than on the prior exam. Reduced size of the right lateral breast mass compared to the prior 03/15/2018 exam. 2. New mild interstitial accentuation in the lungs, significance uncertain. Part of this appearance may be due to lower lung volumes on today's exam. 3. Mild wall thickening in the descending colon and upper rectum suggesting low-grade colitis/inflammation. Prominent stool throughout the colon favors constipation. 4. Other imaging findings of potential clinical significance: Aortic Atherosclerosis (ICD10-I70.0). Mild cardiomegaly. Mild nodularity in the right lower lobe appears stable. Contracted and thick-walled gallbladder.     09/18/2018 Imaging    CT CAP W Contrast 09/18/18  IMPRESSION: 1. Liver metastases have decreased in size. 2. Right breast mass, mild right axillary lymphadenopathy and scattered tiny right pulmonary nodules are all stable. 3. New mild left supraclavicular and left subpectoral lymphadenopathy, can not exclude progression of metastatic nodal disease. 4. Moderate colorectal stool volume, which may indicate constipation. 5.  Aortic Atherosclerosis (ICD10-I70.0).     Anti-estrogen oral therapy    Letrozole 2.5 mg daily starting 10/20/18       CURRENT THERAPY:  -First line weekly TaxolwithHerceptin/Perjeta every 3 weeks starting 04/07/18. She developed infusion reaction to taxol and it was discontinued. Changed to Abraxaneweekly2 weeks on/1 week offfrom 07/21/2018.  Abraxane held after October 13, 2018 due to his worsening neuropathy -Letrozole 2.5 mg daily starting 10/20/18   INTERVAL HISTORY:  Teresa SASSONEis here for a follow up and treatment. She presents to the clinic today by herself. She called her niece to be included in the visit today. She notes she is doing well.  She notes having LE edema and neuropathy. She notes the edema does hurt and her neuropathy is a 10 at night and manageable during the day. She is taking Gabapentin 205mnightly. She notes the edema is mainly in right lower leg for the past 2 weeks. She is still on Xarelto.  She was emotional about having to change treatment soon.     REVIEW OF SYSTEMS:   Constitutional: Denies fevers, chills or abnormal weight loss Eyes: Denies blurriness of vision Ears, nose, mouth, throat, and face: Denies mucositis or sore throat Respiratory: Denies cough, dyspnea or wheezes Cardiovascular: Denies palpitation, chest discomfort (+) lower extremity swelling R>L Gastrointestinal:  Denies nausea, heartburn or change in bowel habits Skin: Denies abnormal skin rashes Lymphatics: Denies new lymphadenopathy or easy bruising Neurological:Denies new weaknesses (+) Neuropathy, 7-8 now in feet and mildly in fingers Behavioral/Psych: Mood is stable, no new changes  All other systems were reviewed with the patient and are negative.  MEDICAL HISTORY:  Past Medical History:  Diagnosis Date   GERD (gastroesophageal reflux disease)    Hypertension    rt breast ca with mets to liver dx'd 03/2018    SURGICAL HISTORY: Past Surgical History:  Procedure Laterality Date   COLONOSCOPY     ESOPHAGOGASTRODUODENOSCOPY ENDOSCOPY     IR IMAGING GUIDED PORT INSERTION   04/04/2018    I have reviewed the social history and family history with the patient and they are unchanged from previous note.  ALLERGIES:  has No Known Allergies.  MEDICATIONS:  Current Outpatient Medications  Medication Sig Dispense Refill   amLODipine (NORVASC) 5 MG tablet Take 5 mg by mouth daily.      atenolol (TENORMIN) 50 MG tablet Take 50 mg by mouth daily.      diphenoxylate-atropine (LOMOTIL) 2.5-0.025 MG tablet Take 2 tablets by mouth 4 (four) times daily as needed for diarrhea or loose stools. 30 tablet 0   gabapentin (NEURONTIN) 100 MG capsule Take 2 capsules (200 mg total) by mouth 3 (three) times daily. Take 200 mg (2 capsules) tid x 1 week, then increase to 300 mg (3 capsules) tid thereafter. 232 capsule 0   lidocaine-prilocaine (EMLA) cream Apply to affected area once 30 g 3   LORazepam (ATIVAN) 0.5 MG tablet Take 1 tablet (0.5 mg total) by mouth at bedtime as needed for anxiety. 30 tablet 0   omeprazole (PRILOSEC) 20 MG capsule Take 1 capsule (20 mg total) by mouth daily. 30 capsule 3   ondansetron (ZOFRAN) 8 MG tablet Take 1 tablet (8 mg total) by mouth 2 (two) times daily as needed (Nausea or vomiting). 30 tablet 1   potassium chloride SA (K-DUR,KLOR-CON) 20 MEQ tablet Take 1 tablet by mouth once daily 30 tablet 0   rivaroxaban (XARELTO) 20 MG TABS tablet Take 1 tablet (20 mg total) by mouth daily with supper. 30 tablet 5   traMADol (ULTRAM) 50 MG tablet Take 1 tablet (50 mg total) by mouth every 6 (six) hours as needed for moderate pain or severe pain. 30 tablet 1   gabapentin (NEURONTIN) 300 MG capsule Take 1 capsule (300 mg total) by mouth 3 (three) times daily. 90 capsule 1   letrozole (FEMARA) 2.5 MG tablet Take 1 tablet (2.5 mg total) by mouth daily. 30 tablet 3   No current facility-administered medications for this visit.    Facility-Administered Medications Ordered in Other Visits  Medication Dose Route Frequency Provider Last Rate Last Dose    sodium chloride flush (NS) 0.9 % injection 10 mL  10 mL Intracatheter PRN [name], MD   10 mL at 10/13/18 1556    PHYSICAL EXAMINATION: ECOG PERFORMANCE STATUS: 1 - Symptomatic but completely ambulatory  Vitals:   10/13/18 1031  BP: 139/84  Pulse: 61  Resp: 18  Temp: 97.6 F (36.4 C)  SpO2: 100%   Filed Weights   10/13/18 1031  Weight: 138 lb 9.6 oz (62.9 kg)    GENERAL:alert, no distress and comfortable SKIN: skin color, texture, turgor are normal, no rashes or significant lesions EYES: normal, Conjunctiva are pink and non-injected, sclera clear OROPHARYNX:no exudate, no erythema and lips, buccal mucosa, and tongue normal  NECK: supple, thyroid normal size, non-tender, without nodularity LYMPH:  no palpable lymphadenopathy in the cervical, axillary or inguinal LUNGS: clear to auscultation and percussion with normal breathing effort HEART: regular rate & rhythm and no murmurs (+) Right lower extremity edema, tender (+) slight soft tissue fullness of left supraclavicular area ABDOMEN:abdomen soft, non-tender and normal bowel sounds Musculoskeletal:no  cyanosis of digits and no clubbing  NEURO: alert & oriented x 3 with fluent speech, no focal motor (+) moderate sensory deficits  LABORATORY DATA:  I have reviewed the data as listed CBC Latest Ref Rng & Units 10/13/2018 09/29/2018 09/22/2018  WBC 4.0 - 10.5 K/uL 4.9 4.1 5.5  Hemoglobin 12.0 - 15.0 g/dL 9.6(L) 9.2(L) 9.6(L)  Hematocrit 36.0 - 46.0 % 30.5(L) 29.4(L) 30.2(L)  Platelets 150 - 400 K/uL 229 222 283     CMP Latest Ref Rng & Units 10/13/2018 09/29/2018 09/22/2018  Glucose 70 - 99 mg/dL 88 93 86  BUN 8 - 23 mg/dL _0 Creatinine 0.44 - 1.00 mg/dL 0.94 0.97 1.03(H)  Sodium 135 - 145 mmol/L 141 139 139  Potassium 3.5 - 5.1 mmol/L 3.5 4.2 3.6  Chloride 98 - 111 mmol/L 108 107 107  CO2 22 - 32 mmol/L _1 Calcium 8.9 - 10.3 mg/dL 9.0 9.3 9.3  Total Protein 6.5 - 8.1 g/dL 6.8 7.0 6.6  Total Bilirubin 0.3 - 1.2  mg/dL 0.3 0.5 0.3  Alkaline Phos 38 - 126 U/L 94 91 85  AST 15 - 41 U/L _2 ALT 0 - 44 U/L _3 RADIOGRAPHIC STUDIES: I have personally reviewed the radiological images as listed and agreed with the findings in the report. Vas [place] Lower Extremity Venous (dvt)  Result Date: 10/13/2018  Lower Venous Study Indications: Swelling, and cancer.  Performing Technologist: Maudry Mayhew RDMS, RVT, RDCS  Examination Guidelines: A complete evaluation includes B-mode imaging, spectral Doppler, color Doppler, and power Doppler as needed of all accessible portions of each vessel. Bilateral testing is considered an integral part of a complete examination. Limited examinations for reoccurring indications may be performed as noted.  Right Venous Findings: +---------+---------------+---------+-----------+----------+-------+            Compressibility Phasicity Spontaneity Properties Summary  +---------+---------------+---------+-----------+----------+-------+  CFV       Full            Yes       Yes                             +---------+---------------+---------+-----------+----------+-------+  SFJ       Full                                                      +---------+---------------+---------+-----------+----------+-------+  FV Prox   Full                                                      +---------+---------------+---------+-----------+----------+-------+  FV Mid    Full                                                      +---------+---------------+---------+-----------+----------+-------+  FV Distal Full                                                      +---------+---------------+---------+-----------+----------+-------+  PFV       Full                                                      +---------+---------------+---------+-----------+----------+-------+  POP       Full            Yes       Yes                              +---------+---------------+---------+-----------+----------+-------+  PTV       Full                                                      +---------+---------------+---------+-----------+----------+-------+  PERO      Full                                                      +---------+---------------+---------+-----------+----------+-------+  Left Venous Findings: +---------+---------------+---------+-----------+----------+-------+            Compressibility Phasicity Spontaneity Properties Summary  +---------+---------------+---------+-----------+----------+-------+  CFV       Full            Yes       Yes                             +---------+---------------+---------+-----------+----------+-------+  SFJ       Full                                                      +---------+---------------+---------+-----------+----------+-------+  FV Prox   Full                                                      +---------+---------------+---------+-----------+----------+-------+  FV Mid    Full                                                      +---------+---------------+---------+-----------+----------+-------+  FV Distal Full                                                      +---------+---------------+---------+-----------+----------+-------+  PFV       Full                                                      +---------+---------------+---------+-----------+----------+-------+  POP       Full            Yes       Yes                             +---------+---------------+---------+-----------+----------+-------+  PTV       Full                                                      +---------+---------------+---------+-----------+----------+-------+  PERO      Full                                                      +---------+---------------+---------+-----------+----------+-------+    Summary: Right: There is no evidence of deep vein thrombosis in the lower extremity. No cystic structure found in the popliteal  fossa. Ultrasound characteristics of enlarged lymph nodes are noted in the groin. Left: There is no evidence of deep vein thrombosis in the lower extremity. No cystic structure found in the popliteal fossa. Ultrasound characteristics of enlarged lymph nodes noted in the groin.  *See table(s) above for measurements and observations. Electronically signed by Deitra Mayo MD on 10/13/2018 at 5:22:07 PM.    Final      ASSESSMENT & PLAN:  Teresa Hays is a 63 y.o. female with    1. Metastatic right breast cancer to liver, cT2N1M1, stage IV, ER+/PR+/HER2+, Liver mets ER+/PR+/HER2- -She was diagnosed in 03/2018.She presented with diffuse liver metastasis, tworight breast massesand right axillary adenopathy. Breast mass biopsy showed ER PR positive, but 1 was HER-2 positive, the other one was HER-2 negative. Liver met was HER2-.  -Given the metastatic disease, her cancer is not curable but still treatable. She is currently on First-line Herceptin/Perjetaevery 3 weeks and weekly Taxol. Due to infusionreaction, taxol was switched to Abraxanefor 2 weeks on/1 week off.Tolerating well.  -Her right breast mass is no longer palpable on exam, she continues to show good response. -Given her worsening neuropathy I will decrease Abraxane does to 24m/m2 today and stop after today's treatment.  -I discussed she will continue Herceptin/perjeta every 3 weeks and start her on anti-estrogen with letrozole. I also discussed she still has more treatment options to control her disease.   -I reviewed letrozole side effects with patient in great detail including hot flashes, joint pain and stiffness and mood swings. She is interested. Plan to start in 1 week -Labs reviewed, CBC and CMP WNL except mild anemia. Overall adequate to proceed with Abraxane, Herceptin/Perjeta today -F/u in 3 weeks   2.  Peripheral neuropathy, grade 2 -Secondary chemotherapy, mainly in her feet -Her neuropathy has increased to  significant pain in feet, R>L and mildly in fingers.  -She has moderate to significant sensory deficit on exam today (10/13/18)  -I will reduce Abraxane dose to 448mm2 starting with cycle 10  -Will increase Gabapentin to 3004mightly, will titrate up to 600m73m needed.   3.Left UEDVT -Shehas recent swelling concerning for DVT on exam. 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 -Her left arm edema has resolved  -Continue on Xarelto 75m indefinitely, unless she has significant bleeding or other side effects. -She has developed LE edema, right significantly more than left for the past 2 weeks. I will get doppler today rule out DVT.   4. Upper abdominal and back pain -She has been on Tramadol half tablets every 6 hours as needed -Resolved now   5. Anemia -she developed worseninganemia since she started chemotherapy -Continue monitoring, will consider blood transfusion if hemoglobin less than 8, always symptomatic anemia. -stable, hg at 9.6 today (10/13/18)  6 Social support  -She lives alone, has her sister and niece in [place] Her daughter lives in a few hours away  -On Ativan PRN due to anxietyabout diagnosis and treatment  7. Goal of care discussion  -she is full code -She understands the goal of care is palliative, to prolong her life, and improve her quality of life.   Plan: -I refilled Gabapentin at 3056mand Prilosec and Xarelto today  -I prescribed letrozole to start in 1 week  [facility] -Labs reviewed and adequate to proceed with Abraxane, Herceptin and Perjeta.  Due to her neuropathy, I will reduce Abraxane dose to 40 mg/m and stop after today's treatment  -Lab, flush, f/u and treatment in 3 weeks     No problem-specific Assessment & Plan  notes found for this encounter.   No orders of the defined types were placed in this encounter.  All questions were answered. The patient knows to call the clinic with any problems, questions or concerns. No barriers to learning was detected. I spent 20 minutes counseling the patient face to face. The total time spent in the appointment was 25 minutes and more than 50% was on counseling and review of test results     YaTruitt MerleMD 10/13/2018   I, AmJoslyn Devonam acting as scribe for YaTruitt MerleMD.   I have reviewed the above documentation for accuracy and completeness, and I agree with the above.

## 2018-10-13 ENCOUNTER — Other Ambulatory Visit: Payer: Self-pay

## 2018-10-13 ENCOUNTER — Inpatient Hospital Stay: Payer: 59

## 2018-10-13 ENCOUNTER — Ambulatory Visit (HOSPITAL_COMMUNITY)
Admission: RE | Admit: 2018-10-13 | Discharge: 2018-10-13 | Disposition: A | Discharge status: Home / Self Care | Payer: 59 | Source: Ambulatory Visit | Attending: Hematology | Admitting: Hematology

## 2018-10-13 ENCOUNTER — Inpatient Hospital Stay (HOSPITAL_BASED_OUTPATIENT_CLINIC_OR_DEPARTMENT_OTHER): Payer: 59 | Admitting: Hematology

## 2018-10-13 ENCOUNTER — Encounter: Payer: Self-pay | Admitting: Hematology

## 2018-10-13 VITALS — BP 139/84 | HR 61 | Temp 97.6°F | Resp 18 | Ht 64.0 in | Wt 138.6 lb

## 2018-10-13 DIAGNOSIS — Z7189 Other specified counseling: Secondary | ICD-10-CM

## 2018-10-13 DIAGNOSIS — C787 Secondary malignant neoplasm of liver and intrahepatic bile duct: Secondary | ICD-10-CM | POA: Diagnosis not present

## 2018-10-13 DIAGNOSIS — D649 Anemia, unspecified: Secondary | ICD-10-CM

## 2018-10-13 DIAGNOSIS — Z5111 Encounter for antineoplastic chemotherapy: Secondary | ICD-10-CM | POA: Diagnosis not present

## 2018-10-13 DIAGNOSIS — C50911 Malignant neoplasm of unspecified site of right female breast: Secondary | ICD-10-CM

## 2018-10-13 DIAGNOSIS — I82622 Acute embolism and thrombosis of deep veins of left upper extremity: Secondary | ICD-10-CM | POA: Diagnosis not present

## 2018-10-13 DIAGNOSIS — R600 Localized edema: Secondary | ICD-10-CM

## 2018-10-13 DIAGNOSIS — C50919 Malignant neoplasm of unspecified site of unspecified female breast: Secondary | ICD-10-CM

## 2018-10-13 DIAGNOSIS — D6481 Anemia due to antineoplastic chemotherapy: Secondary | ICD-10-CM

## 2018-10-13 DIAGNOSIS — G620 Drug-induced polyneuropathy: Secondary | ICD-10-CM

## 2018-10-13 DIAGNOSIS — T451X5A Adverse effect of antineoplastic and immunosuppressive drugs, initial encounter: Secondary | ICD-10-CM

## 2018-10-13 DIAGNOSIS — Z5112 Encounter for antineoplastic immunotherapy: Secondary | ICD-10-CM | POA: Diagnosis not present

## 2018-10-13 DIAGNOSIS — Z95828 Presence of other vascular implants and grafts: Secondary | ICD-10-CM

## 2018-10-13 LAB — CMP (CANCER CENTER ONLY)
ALT: 13 U/L (ref 0–44)
AST: 21 U/L (ref 15–41)
Albumin: 3.6 g/dL (ref 3.5–5.0)
Alkaline Phosphatase: 94 U/L (ref 38–126)
Anion gap: 10 (ref 5–15)
BUN: 16 mg/dL (ref 8–23)
CO2: 23 mmol/L (ref 22–32)
Calcium: 9 mg/dL (ref 8.9–10.3)
Chloride: 108 mmol/L (ref 98–111)
Creatinine: 0.94 mg/dL (ref 0.44–1.00)
GFR, Est AFR Am: >60 mL/min (ref >60)
GFR, Estimated: >60 mL/min (ref >60)
Glucose, Bld: 88 mg/dL (ref 70–99)
Potassium: 3.5 mmol/L (ref 3.5–5.1)
Sodium: 141 mmol/L (ref 135–145)
Total Bilirubin: 0.3 mg/dL (ref 0.3–1.2)
Total Protein: 6.8 g/dL (ref 6.5–8.1)

## 2018-10-13 LAB — CBC WITH DIFFERENTIAL (CANCER CENTER ONLY)
Abs Immature Granulocytes: 0.01 10*3/uL (ref 0.00–0.07)
Basophils Absolute: 0.1 10*3/uL (ref 0.0–0.1)
Basophils Relative: 1 %
Eosinophils Absolute: 0.2 10*3/uL (ref 0.0–0.5)
Eosinophils Relative: 4 %
HCT: 30.5 % — ABNORMAL LOW (ref 36.0–46.0)
Hemoglobin: 9.6 g/dL — ABNORMAL LOW (ref 12.0–15.0)
Immature Granulocytes: 0 %
Lymphocytes Relative: 37 %
Lymphs Abs: 1.8 10*3/uL (ref 0.7–4.0)
MCH: 27.8 pg (ref 26.0–34.0)
MCHC: 31.5 g/dL (ref 30.0–36.0)
MCV: 88.4 fL (ref 80.0–100.0)
Monocytes Absolute: 0.4 10*3/uL (ref 0.1–1.0)
Monocytes Relative: 8 %
Neutro Abs: 2.5 10*3/uL (ref 1.7–7.7)
Neutrophils Relative %: 50 %
Platelet Count: 229 10*3/uL (ref 150–400)
RBC: 3.45 MIL/uL — ABNORMAL LOW (ref 3.87–5.11)
RDW: 16.4 % — ABNORMAL HIGH (ref 11.5–15.5)
WBC Count: 4.9 10*3/uL (ref 4.0–10.5)
nRBC: 0 % (ref 0.0–0.2)

## 2018-10-13 MED ORDER — ACETAMINOPHEN 325 MG PO TABS
650.0000 mg | ORAL_TABLET | Freq: Once | ORAL | Status: AC
  Administered 2018-10-13: 650 mg via ORAL

## 2018-10-13 MED ORDER — TRASTUZUMAB CHEMO 150 MG IV SOLR
6.0000 mg/kg | Freq: Once | INTRAVENOUS | Status: AC
  Administered 2018-10-13: 399 mg via INTRAVENOUS
  Filled 2018-10-13: qty 19

## 2018-10-13 MED ORDER — OMEPRAZOLE 20 MG PO CPDR: 20.0000 mg | DELAYED_RELEASE_CAPSULE | Freq: Every day | ORAL | 3 refills | Status: DC

## 2018-10-13 MED ORDER — PACLITAXEL PROTEIN-BOUND CHEMO INJECTION 100 MG
40.0000 mg/m2 | Freq: Once | INTRAVENOUS | Status: AC
  Administered 2018-10-13: 75 mg via INTRAVENOUS
  Filled 2018-10-13: qty 15

## 2018-10-13 MED ORDER — PROCHLORPERAZINE MALEATE 10 MG PO TABS
ORAL_TABLET | ORAL | Status: AC
  Filled 2018-10-13: qty 1

## 2018-10-13 MED ORDER — DIPHENHYDRAMINE HCL 50 MG/ML IJ SOLN
INTRAMUSCULAR | Status: AC
  Filled 2018-10-13: qty 1

## 2018-10-13 MED ORDER — SODIUM CHLORIDE 0.9 % IV SOLN
Freq: Once | INTRAVENOUS | Status: AC
  Administered 2018-10-13: 12:00:00 via INTRAVENOUS
  Filled 2018-10-13: qty 250

## 2018-10-13 MED ORDER — RIVAROXABAN 20 MG PO TABS: 20.0000 mg | ORAL_TABLET | Freq: Every day | ORAL | 5 refills | Status: DC

## 2018-10-13 MED ORDER — SODIUM CHLORIDE 0.9 % IV SOLN
420.0000 mg | Freq: Once | INTRAVENOUS | Status: AC
  Administered 2018-10-13: 420 mg via INTRAVENOUS
  Filled 2018-10-13: qty 14

## 2018-10-13 MED ORDER — GABAPENTIN 300 MG PO CAPS: 300.0000 mg | ORAL_CAPSULE | Freq: Three times a day (TID) | ORAL | 1 refills | Status: DC

## 2018-10-13 MED ORDER — SODIUM CHLORIDE 0.9% FLUSH
10.0000 mL | INTRAVENOUS | Status: DC | PRN
  Administered 2018-10-13: 10 mL
  Filled 2018-10-13: qty 10

## 2018-10-13 MED ORDER — LETROZOLE 2.5 MG PO TABS: 2.5000 mg | ORAL_TABLET | Freq: Every day | ORAL | 3 refills | Status: DC

## 2018-10-13 MED ORDER — ONDANSETRON HCL 8 MG PO TABS
8.0000 mg | ORAL_TABLET | Freq: Once | ORAL | Status: AC
  Administered 2018-10-13: 8 mg via ORAL

## 2018-10-13 MED ORDER — HEPARIN SOD (PORK) LOCK FLUSH 100 UNIT/ML IV SOLN
500.0000 [IU] | Freq: Once | INTRAVENOUS | Status: AC | PRN
  Administered 2018-10-13: 500 [IU]
  Filled 2018-10-13: qty 5

## 2018-10-13 MED ORDER — ACETAMINOPHEN 325 MG PO TABS
ORAL_TABLET | ORAL | Status: AC
  Filled 2018-10-13: qty 2

## 2018-10-13 MED ORDER — ONDANSETRON HCL 8 MG PO TABS
ORAL_TABLET | ORAL | Status: AC
  Filled 2018-10-13: qty 1

## 2018-10-13 MED ORDER — PROCHLORPERAZINE MALEATE 10 MG PO TABS
10.0000 mg | ORAL_TABLET | Freq: Once | ORAL | Status: AC
  Administered 2018-10-13: 10 mg via ORAL

## 2018-10-13 MED ORDER — DIPHENHYDRAMINE HCL 50 MG/ML IJ SOLN
50.0000 mg | Freq: Once | INTRAMUSCULAR | Status: AC
  Administered 2018-10-13: 50 mg via INTRAVENOUS

## 2018-10-13 NOTE — Progress Notes (Signed)
Bilateral lower extremity venous duplex completed. Refer to "CV Proc" under chart review to view preliminary results.  10/13/2018 12:24 PM Maudry Mayhew, MHA, RVT, RDCS, RDMS

## 2018-10-13 NOTE — Patient Instructions (Signed)
[facility] Discharge Instructions for Patients Receiving Chemotherapy  Today you received the following chemotherapy agents:  Perjeta, Herceptin and Abraxane.  To help prevent nausea and vomiting after your treatment, we encourage you to take your nausea medication as prescribed.   If you develop nausea and vomiting that is not controlled by your nausea medication, call the clinic.   BELOW ARE SYMPTOMS THAT SHOULD BE REPORTED IMMEDIATELY:  *FEVER GREATER THAN 100.5 F  *CHILLS WITH OR WITHOUT FEVER  NAUSEA AND VOMITING THAT IS NOT CONTROLLED WITH YOUR NAUSEA MEDICATION  *UNUSUAL SHORTNESS OF BREATH  *UNUSUAL BRUISING OR BLEEDING  TENDERNESS IN MOUTH AND THROAT WITH OR WITHOUT PRESENCE OF ULCERS  *URINARY PROBLEMS  *BOWEL PROBLEMS  UNUSUAL RASH Items with * indicate a potential emergency and should be followed up as soon as possible.  Feel free to call the clinic should you have any questions or concerns. The clinic phone number is [phone].  Please show the [facility] at check-in to the Emergency Department and triage nurse.

## 2018-10-14 ENCOUNTER — Other Ambulatory Visit: Payer: Self-pay | Admitting: Hematology

## 2018-10-14 DIAGNOSIS — C787 Secondary malignant neoplasm of liver and intrahepatic bile duct: Secondary | ICD-10-CM

## 2018-10-16 ENCOUNTER — Telehealth: Payer: Self-pay | Admitting: Hematology

## 2018-10-16 NOTE — Telephone Encounter (Signed)
Per 3/27 los, cancelled appts for 4/3.

## 2018-10-18 ENCOUNTER — Telehealth: Payer: Self-pay | Admitting: Hematology

## 2018-10-18 NOTE — Telephone Encounter (Signed)
R/s appt per 3/31 sch message - pt aware of appt date and time

## 2018-10-20 ENCOUNTER — Ambulatory Visit: Payer: 59

## 2018-10-20 ENCOUNTER — Other Ambulatory Visit: Payer: 59

## 2018-10-20 ENCOUNTER — Telehealth: Payer: Self-pay | Admitting: *Deleted

## 2018-10-20 MED FILL — LETROZOLE 2.5 MG TABLET: 2.5 | 30 days supply | Qty: 30 | Fill #0

## 2018-10-20 MED FILL — XARELTO 20 MG TABLET: 20 | 30 days supply | Qty: 30 | Fill #1

## 2018-10-20 NOTE — Telephone Encounter (Signed)
[facility] Support Services   [facility] Support Services Team contacted patient to assess for food insecurity and other psychosocial needs during current COVID19 pandemic.    Patient/family expressed no needs at this time.  Support Team member encouraged patient to call if changes occur or they have any other questions/concerns.        Ciani Rutten, LCSW  [facility]        

## 2018-10-30 NOTE — Progress Notes (Signed)
[place]   [phone] [phone]   Clinic Follow up Note   Patient Care Team: Levin Bacon, NP as PCP - General (Family Medicine) [name], MD as Consulting Physician (Gastroenterology) [name], MD as Consulting Physician (Hematology)  Date of Service:  11/02/2018  CHIEF COMPLAINT: F/u for metastatic breast cancer  SUMMARY OF ONCOLOGIC HISTORY: Oncology History   Cancer Staging Metastatic breast cancer [facility]) Staging form: Breast, AJCC 8th Edition - Clinical stage from 03/24/2018: Stage IV (cT2, cN1, pM1, G3, ER+, PR+, HER2+) - Signed by [name], MD on 03/30/2018       Metastatic breast cancer ([place])   01/30/2018 Procedure    Colonoscopy showed small polyp in the sigmoid colon, removed, the exam of colon including the terminal ileum was otherwise negative.    01/30/2018 Procedure    EGD by Dr. Collene Mares showed small hiatal hernia, a 8 mm polypoid lesion in the cardia, biopsied.    03/09/2018 Imaging    03/09/2018 US Abdomen IMPRESSION: 1. Mass lesions throughout the liver, consistent with metastatic disease. Liver as a somewhat nodular contour suggesting underlying hepatic cirrhosis. Inhomogeneous echotexture to the liver.  2. Cholelithiasis with mild gallbladder wall thickening. A degree of cholecystitis cannot be excluded by ultrasound.  3. Portions of pancreas obscured by gas. Visualized portions of pancreas appear normal.  4. Small right kidney. Etiology uncertain. This finding potentially may be indicative of renal artery stenosis. In this regard, question whether patient is hypertensive.    03/15/2018 Imaging    CT CAP with contrast  IMPRESSION: 1. Widespread hepatic metastasis. 2. 2.6 cm lateral right breast soft tissue nodule could represent a breast primary or an incidental benign lesion. Consider correlation with mammogram and ultrasound. 3. No definite source of primary malignancy identified within the abdomen or pelvis.  There is possible rectosigmoid junction wall thickening. Consider colonoscopy with attention to this area. 4. Distal esophageal wall thickening, suggesting esophagitis.    03/24/2018 Cancer Staging    Staging form: Breast, AJCC 8th Edition - Clinical stage from 03/24/2018: Stage IV (cT2, cN1, pM1, G3, ER+, PR+, HER2+) - Signed by [name], MD on 03/30/2018    03/24/2018 Initial Biopsy    Diagnosis 1. 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 Breast, right, needle core biopsy, 9 o'clock, 7cm from nipple - INVASIVE DUCTAL CARCINOMA. -The carcinoma is somewhat morphologically dissimilar from that in part 1. It appears grade III 3. Lymph node, needle/core biopsy, right axillary - METASTATIC CARCINOMA IN 1 OF 1 LYMPH NODE (1/1).    03/24/2018 Receptors her2    Breast biopsy: 1. Estrogen Receptor: 40%, POSITIVE, STRONG-MODERATE STAINING INTENSITY Progesterone Receptor: 70%, POSITIVE, STRONG STAINING INTENSITY Proliferation Marker Ki67: 20% HER 2 equivocal by IHC 2+, POSITIVE by FISH, ratio 2.4 and copy #4.2  2. Estrogen Receptor: 60%, POSITIVE, MODERATE STAINING INTENSITY Progesterone Receptor: 40%, POSITIVE, MODERATE STAINING INTENSITY Proliferation Marker Ki67: 20% HER2 (+) by IHC 3+    03/24/2018 Initial Diagnosis    Metastatic breast cancer ([place])    03/27/2018 Pathology Results    Diagnosis Liver, needle/core biopsy, Right - METASTATIC CARCINOMA TO LIVER, CONSISTENT WITH PATIENTS CLINICAL HISTORY OF PRIMARY BREAST CARCINOMA.  ER 80%+ PR40%+ HER2- (by [facility], IHC 2+)  Ki67 50%     03/28/2018 Pathology Results    03/28/2018 Surgical Pathology Diagnosis 1. Breast, left, needle core biopsy, 9 o'clock - FIBROCYSTIC CHANGES. - THERE  IS NO EVIDENCE OF MALIGNANCY. 2. Breast, left, needle core biopsy, 2 o'clock - FIBROADENOMA. - THERE IS NO EVIDENCE OF MALIGNANCY. - SEE COMMENT.    03/29/2018  Imaging    03/29/2018 Bone Scan IMPRESSION: No scintigraphic evidence of osseous metastatic disease.    03/30/2018 Imaging    Bone scan  IMPRESSION: No scintigraphic evidence of osseous metastatic disease.     04/07/2018 -  Chemotherapy    First line chemo weekly Taxol and herceptin/Perjeta every 3 weeks starting 04/07/18. She developed infusion reaction to taxol and it was discontinued. Added Abraxane on C1D8, 2 weeks on/1 week off.  Abraxne stopped after 10/13/18 due to worsening Neuropathy    06/06/2018 Imaging    CT CAP IMPRESSION: 1. Generally improved appearance, with reduced axillary adenopathy and reduced enhancing component of the hepatic masses, with some of the hepatic mass is moderately smaller than on the prior exam. Reduced size of the right lateral breast mass compared to the prior 03/15/2018 exam. 2. New mild interstitial accentuation in the lungs, significance uncertain. Part of this appearance may be due to lower lung volumes on today's exam. 3. Mild wall thickening in the descending colon and upper rectum suggesting low-grade colitis/inflammation. Prominent stool throughout the colon favors constipation. 4. Other imaging findings of potential clinical significance: Aortic Atherosclerosis (ICD10-I70.0). Mild cardiomegaly. Mild nodularity in the right lower lobe appears stable. Contracted and thick-walled gallbladder.     09/18/2018 Imaging    CT CAP W Contrast 09/18/18  IMPRESSION: 1. Liver metastases have decreased in size. 2. Right breast mass, mild right axillary lymphadenopathy and scattered tiny right pulmonary nodules are all stable. 3. New mild left supraclavicular and left subpectoral lymphadenopathy, can not exclude progression of metastatic nodal disease. 4. Moderate colorectal stool volume, which may indicate constipation. 5.  Aortic Atherosclerosis (ICD10-I70.0).    10/2018 -  Anti-estrogen oral therapy    Letrozole 2.5 mg daily starting 10/2018       CURRENT THERAPY:  -First line weekly TaxolwithHerceptin/Perjeta every 3 weeks starting 04/07/18. She developed infusion reaction to taxol and it was discontinued. Changed to Abraxaneweekly2 weeks on/1 week offfrom 07/21/2018.  Abraxane held after October 13, 2018 due to his worsening neuropathy -Letrozole 2.5 mg daily starting 10/2018  INTERVAL HISTORY:  Teresa Hays is here for a follow up and treatment?. She presents to the clinic today by herself. She notes she is doing well except her worsening neuropathy. She notes she takes Gabapentin '300mg'$  TID and Tramadol. She does not feel drowsy from gabapentin.  She notes pain from LE ankle swelling R>L.     REVIEW OF SYSTEMS:   Constitutional: Denies fevers, chills or abnormal weight loss Eyes: Denies blurriness of vision Ears, nose, mouth, throat, and face: Denies mucositis or sore throat Respiratory: Denies cough, dyspnea or wheezes Cardiovascular: Denies palpitation, chest discomfort (+) b/l lower extremity swelling, R>L Gastrointestinal:  Denies nausea, heartburn or change in bowel habits Skin: Denies abnormal skin rashes Lymphatics: Denies new lymphadenopathy or easy bruising Neurological:Denies  new weaknesses (+) Worsening neuropathy  Behavioral/Psych: Mood is stable, no new changes  All other systems were reviewed with the patient and are negative.  MEDICAL HISTORY:  Past Medical History:  Diagnosis Date  . GERD (gastroesophageal reflux disease)   . Hypertension   . rt breast ca with mets to liver dx'd 03/2018    SURGICAL HISTORY: Past Surgical History:  Procedure Laterality Date  . COLONOSCOPY    . ESOPHAGOGASTRODUODENOSCOPY ENDOSCOPY    . IR IMAGING  GUIDED PORT INSERTION  04/04/2018    I have reviewed the social history and family history with the patient and they are unchanged from previous note.  ALLERGIES:  has No Known Allergies.  MEDICATIONS:  Current Outpatient Medications  Medication Sig Dispense Refill   . amLODipine (NORVASC) 5 MG tablet Take 5 mg by mouth daily.     Marland Kitchen atenolol (TENORMIN) 50 MG tablet Take 50 mg by mouth daily.     . diphenoxylate-atropine (LOMOTIL) 2.5-0.025 MG tablet Take 2 tablets by mouth 4 (four) times daily as needed for diarrhea or loose stools. 30 tablet 0  . gabapentin (NEURONTIN) 100 MG capsule Take 2 capsules (200 mg total) by mouth 3 (three) times daily. Take 200 mg (2 capsules) tid x 1 week, then increase to 300 mg (3 capsules) tid thereafter. 232 capsule 0  . gabapentin (NEURONTIN) 300 MG capsule Take 1 capsule (300 mg total) by mouth 3 (three) times daily. 90 capsule 1  . letrozole (FEMARA) 2.5 MG tablet Take 1 tablet (2.5 mg total) by mouth daily. 30 tablet 3  . lidocaine-prilocaine (EMLA) cream Apply to affected area once 30 g 3  . LORazepam (ATIVAN) 0.5 MG tablet Take 1 tablet (0.5 mg total) by mouth at bedtime as needed for anxiety. 30 tablet 0  . omeprazole (PRILOSEC) 20 MG capsule Take 1 capsule (20 mg total) by mouth daily. 30 capsule 3  . ondansetron (ZOFRAN) 8 MG tablet Take 1 tablet (8 mg total) by mouth 2 (two) times daily as needed (Nausea or vomiting). 30 tablet 1  . potassium chloride SA (K-DUR,KLOR-CON) 20 MEQ tablet Take 1 tablet by mouth once daily 30 tablet 0  . rivaroxaban (XARELTO) 20 MG TABS tablet Take 1 tablet (20 mg total) by mouth daily with supper. 30 tablet 5  . traMADol (ULTRAM) 50 MG tablet Take 1 tablet (50 mg total) by mouth every 6 (six) hours as needed for moderate pain or severe pain. 30 tablet 1   No current facility-administered medications for this visit.    Facility-Administered Medications Ordered in Other Visits  Medication Dose Route Frequency Provider Last Rate Last Dose  . heparin lock flush 100 unit/mL  500 Units Intracatheter Once PRN [name], MD      . pertuzumab (PERJETA) 420 mg in sodium chloride 0.9 % 250 mL chemo infusion  420 mg Intravenous Once [name], MD      . sodium chloride flush (NS) 0.9 % injection 10  mL  10 mL Intracatheter PRN [name], MD        PHYSICAL EXAMINATION: ECOG PERFORMANCE STATUS: 1 - Symptomatic but completely ambulatory  Vitals:   11/02/18 0807  BP: 139/80  Pulse: 63  Resp: 18  Temp: 99.5 F (37.5 C)  SpO2: 100%   Filed Weights   11/02/18 0807  Weight: 141 lb 1.6 oz (64 kg)    GENERAL:alert, no distress and comfortable SKIN: skin color, texture, turgor are normal, no rashes or significant lesions EYES: normal, Conjunctiva are pink and non-injected, sclera clear OROPHARYNX:no exudate, no erythema and lips, buccal mucosa, and tongue normal  NECK: supple, thyroid normal size, non-tender, without nodularity LYMPH:  no palpable lymphadenopathy in the cervical, axillary or inguinal LUNGS: clear to auscultation and percussion with normal breathing effort HEART: regular rate & rhythm and no murmurs (+) b/l  lower extremity edema, R>L ABDOMEN:abdomen soft, non-tender and normal bowel sounds Musculoskeletal:no cyanosis of digits and no clubbing  NEURO: alert & oriented x 3 with fluent  speech, no focal motor/sensory deficits  LABORATORY DATA:  I have reviewed the data as listed CBC Latest Ref Rng & Units 11/02/2018 10/13/2018 09/29/2018  WBC 4.0 - 10.5 K/uL 6.2 4.9 4.1  Hemoglobin 12.0 - 15.0 g/dL 10.2(L) 9.6(L) 9.2(L)  Hematocrit 36.0 - 46.0 % 32.7(L) 30.5(L) 29.4(L)  Platelets 150 - 400 K/uL 202 229 222     CMP Latest Ref Rng & Units 11/02/2018 10/13/2018 09/29/2018  Glucose 70 - 99 mg/dL 112(H) 88 93  BUN 8 - 23 mg/dL '15 16 10  '$ Creatinine 0.44 - 1.00 mg/dL 1.20(H) 0.94 0.97  Sodium 135 - 145 mmol/L 139 141 139  Potassium 3.5 - 5.1 mmol/L 3.6 3.5 4.2  Chloride 98 - 111 mmol/L 106 108 107  CO2 22 - 32 mmol/L '23 23 24  '$ Calcium 8.9 - 10.3 mg/dL 9.2 9.0 9.3  Total Protein 6.5 - 8.1 g/dL 7.0 6.8 7.0  Total Bilirubin 0.3 - 1.2 mg/dL 0.3 0.3 0.5  Alkaline Phos 38 - 126 U/L 98 94 91  AST 15 - 41 U/L '26 21 22  '$ ALT 0 - 44 U/L '16 13 11      '$ RADIOGRAPHIC STUDIES:  I have personally reviewed the radiological images as listed and agreed with the findings in the report. No results found.   ASSESSMENT & PLAN:  Teresa Hays is a 63 y.o. female with   1. Metastatic right breast cancer to liver, cT2N1M1, stage IV, ER+/PR+/HER2+, Liver mets ER+/PR+/HER2- -She was diagnosed in 03/2018.She presented with diffuse liver metastasis, tworight breast massesand right axillary adenopathy. Breast mass biopsy showed ER PR positive, but 1 was HER-2 positive, the other one was HER-2 negative. Liver met was HER2-.  -Given the metastatic disease, her cancer is not curable but still treatable. She has been treated with First-line Herceptin/Perjetaevery 3 weeks and weekly Taxol. Due to infusionreaction, taxol was switched to Abraxanefor 2 weeks on/1 week off.Tolerating well. We stopped abraxane after cycle 10 due to worsening neuropathy -Her right breast mass is no longer palpable on exam, she continues to show good response. --she has started anti-estrogen therapy with Letrozole in April 2020 and is tolerating well. Will continue letrozole daily and Herceptin/Perjeta every 3 weeks for as long as it controls her disease. She is agreeable.  -Her next ECHO is in June  -Labs revewied, CBC show Hg 10.2, CMP shows BG 112, Cr 1.2, otherwise WNL. Ca 27.29 still pending. Overall adequate to proceed with Herceptin and Perjeta today and continue every 3 weeks  -F/u in 6 weeks   2.Peripheral neuropathy, grade 2 -Secondary chemotherapy,mainly in her feet -Her neuropathy has increased to significant pain in feet, R>L and mildly in fingers.  -She has moderate to significant sensory deficit on exam (10/13/18)  -I have stropped Abraxane on 10/13/18.  -She continues to have worsening neuropathy mainly in her feet. She is currently taking Gabapentin '300mg'$  TID. She will increase her nightly dose to '600mg'$ . She is also taking Tramadol as needed for significant pain.  -I also strongly  encourage her to take Vitamin B12 daily or Prenatal Vitamin that has B6.   -I discussed neuropathy will take time to improve or resolve.   3.Left UEDVT -Shehas recent swelling concerning for DVT on exam. 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 -Her left arm edema hasresolved -Continue on Xarelto '20mg'$  indefinitely, unless she  has significant bleeding or other side effects. -She has developed LE edema, right significantly more than left. I will get doppler today rule out DVT. Edema is improving. She will continue using compression socks and elevate feet when sitting.    4. Anemia -she developed worseninganemia since she started chemotherapy -Continue monitoring, will consider blood transfusion if hemoglobin less than 8, always symptomatic anemia. -stable, hg at 10.2 today (11/02/18)   5Social support  -She lives alone, has her sister and niece in [place]. Her daughter lives in a few hours away  -On Ativan PRN due to anxietyabout diagnosis and treatment  6. Goal of care discussion  -she is full code -She understands the goal of care is palliative, to prolong her life, and improve her quality of life.   Plan: -continue Letrozole  -Labs reviewed and adequate to proceed with Herceptin/Perjeta today  -Lab, flush and Herceptin/Perjeta in 3 and 6 weeks -F/u in 6 weeks   No problem-specific Assessment & Plan notes found for this encounter.   No orders of the defined types were placed in this encounter.  All questions were answered. The patient knows to call the clinic with any problems, questions or concerns. No barriers to learning was detected. I spent 20 minutes counseling the patient face to face. The total time spent in the appointment was 25 minutes and more than 50% was on counseling and review of test results      [name], MD 11/02/2018   I, Joslyn Devon, am acting as scribe for [name], MD.   I have reviewed the above documentation for accuracy and completeness, and I agree with the above.

## 2018-10-31 ENCOUNTER — Encounter (HOSPITAL_COMMUNITY): Payer: 59 | Admitting: Internal Medicine

## 2018-10-31 ENCOUNTER — Ambulatory Visit (HOSPITAL_COMMUNITY): Payer: 59

## 2018-11-02 ENCOUNTER — Inpatient Hospital Stay: Payer: 59

## 2018-11-02 ENCOUNTER — Inpatient Hospital Stay: Payer: 59 | Attending: Hematology

## 2018-11-02 ENCOUNTER — Inpatient Hospital Stay (HOSPITAL_BASED_OUTPATIENT_CLINIC_OR_DEPARTMENT_OTHER): Payer: 59 | Admitting: Hematology

## 2018-11-02 ENCOUNTER — Other Ambulatory Visit: Payer: Self-pay

## 2018-11-02 ENCOUNTER — Encounter: Payer: Self-pay | Admitting: Hematology

## 2018-11-02 ENCOUNTER — Telehealth: Payer: Self-pay | Admitting: Hematology

## 2018-11-02 VITALS — BP 139/80 | HR 63 | Temp 99.5°F | Resp 18 | Ht 64.0 in | Wt 141.1 lb

## 2018-11-02 DIAGNOSIS — Z79899 Other long term (current) drug therapy: Secondary | ICD-10-CM

## 2018-11-02 DIAGNOSIS — Z79811 Long term (current) use of aromatase inhibitors: Secondary | ICD-10-CM

## 2018-11-02 DIAGNOSIS — M25473 Effusion, unspecified ankle: Secondary | ICD-10-CM

## 2018-11-02 DIAGNOSIS — Z5112 Encounter for antineoplastic immunotherapy: Secondary | ICD-10-CM | POA: Diagnosis not present

## 2018-11-02 DIAGNOSIS — I10 Essential (primary) hypertension: Secondary | ICD-10-CM | POA: Diagnosis not present

## 2018-11-02 DIAGNOSIS — R600 Localized edema: Secondary | ICD-10-CM

## 2018-11-02 DIAGNOSIS — D649 Anemia, unspecified: Secondary | ICD-10-CM

## 2018-11-02 DIAGNOSIS — Z7901 Long term (current) use of anticoagulants: Secondary | ICD-10-CM | POA: Diagnosis not present

## 2018-11-02 DIAGNOSIS — C50911 Malignant neoplasm of unspecified site of right female breast: Secondary | ICD-10-CM

## 2018-11-02 DIAGNOSIS — F419 Anxiety disorder, unspecified: Secondary | ICD-10-CM | POA: Insufficient documentation

## 2018-11-02 DIAGNOSIS — K219 Gastro-esophageal reflux disease without esophagitis: Secondary | ICD-10-CM

## 2018-11-02 DIAGNOSIS — C50919 Malignant neoplasm of unspecified site of unspecified female breast: Secondary | ICD-10-CM

## 2018-11-02 DIAGNOSIS — G620 Drug-induced polyneuropathy: Secondary | ICD-10-CM

## 2018-11-02 DIAGNOSIS — M7989 Other specified soft tissue disorders: Secondary | ICD-10-CM

## 2018-11-02 DIAGNOSIS — Z170 Estrogen receptor positive status [ER+]: Secondary | ICD-10-CM | POA: Insufficient documentation

## 2018-11-02 DIAGNOSIS — C787 Secondary malignant neoplasm of liver and intrahepatic bile duct: Secondary | ICD-10-CM | POA: Insufficient documentation

## 2018-11-02 DIAGNOSIS — Z7189 Other specified counseling: Secondary | ICD-10-CM

## 2018-11-02 DIAGNOSIS — Z95828 Presence of other vascular implants and grafts: Secondary | ICD-10-CM

## 2018-11-02 LAB — CMP (CANCER CENTER ONLY)
ALT: 16 U/L (ref 0–44)
AST: 26 U/L (ref 15–41)
Albumin: 3.7 g/dL (ref 3.5–5.0)
Alkaline Phosphatase: 98 U/L (ref 38–126)
Anion gap: 10 (ref 5–15)
BUN: 15 mg/dL (ref 8–23)
CO2: 23 mmol/L (ref 22–32)
Calcium: 9.2 mg/dL (ref 8.9–10.3)
Chloride: 106 mmol/L (ref 98–111)
Creatinine: 1.2 mg/dL — ABNORMAL HIGH (ref 0.44–1.00)
GFR, Est AFR Am: 56 mL/min — ABNORMAL LOW (ref >60)
GFR, Estimated: 48 mL/min — ABNORMAL LOW (ref >60)
Glucose, Bld: 112 mg/dL — ABNORMAL HIGH (ref 70–99)
Potassium: 3.6 mmol/L (ref 3.5–5.1)
Sodium: 139 mmol/L (ref 135–145)
Total Bilirubin: 0.3 mg/dL (ref 0.3–1.2)
Total Protein: 7 g/dL (ref 6.5–8.1)

## 2018-11-02 LAB — CBC WITH DIFFERENTIAL (CANCER CENTER ONLY)
Abs Immature Granulocytes: 0.01 10*3/uL (ref 0.00–0.07)
Basophils Absolute: 0.1 10*3/uL (ref 0.0–0.1)
Basophils Relative: 1 %
Eosinophils Absolute: 0.6 10*3/uL — ABNORMAL HIGH (ref 0.0–0.5)
Eosinophils Relative: 10 %
HCT: 32.7 % — ABNORMAL LOW (ref 36.0–46.0)
Hemoglobin: 10.2 g/dL — ABNORMAL LOW (ref 12.0–15.0)
Immature Granulocytes: 0 %
Lymphocytes Relative: 32 %
Lymphs Abs: 2 10*3/uL (ref 0.7–4.0)
MCH: 27.6 pg (ref 26.0–34.0)
MCHC: 31.2 g/dL (ref 30.0–36.0)
MCV: 88.6 fL (ref 80.0–100.0)
Monocytes Absolute: 0.4 10*3/uL (ref 0.1–1.0)
Monocytes Relative: 6 %
Neutro Abs: 3.2 10*3/uL (ref 1.7–7.7)
Neutrophils Relative %: 51 %
Platelet Count: 202 10*3/uL (ref 150–400)
RBC: 3.69 MIL/uL — ABNORMAL LOW (ref 3.87–5.11)
RDW: 16.8 % — ABNORMAL HIGH (ref 11.5–15.5)
WBC Count: 6.2 10*3/uL (ref 4.0–10.5)
nRBC: 0 % (ref 0.0–0.2)

## 2018-11-02 MED ORDER — TRASTUZUMAB CHEMO 150 MG IV SOLR
6.0000 mg/kg | Freq: Once | INTRAVENOUS | Status: AC
  Administered 2018-11-02: 399 mg via INTRAVENOUS
  Filled 2018-11-02: qty 19

## 2018-11-02 MED ORDER — SODIUM CHLORIDE 0.9 % IV SOLN
Freq: Once | INTRAVENOUS | Status: AC
  Administered 2018-11-02: 09:00:00 via INTRAVENOUS
  Filled 2018-11-02: qty 250

## 2018-11-02 MED ORDER — ACETAMINOPHEN 325 MG PO TABS
650.0000 mg | ORAL_TABLET | Freq: Once | ORAL | Status: AC
  Administered 2018-11-02: 650 mg via ORAL

## 2018-11-02 MED ORDER — HEPARIN SOD (PORK) LOCK FLUSH 100 UNIT/ML IV SOLN
500.0000 [IU] | Freq: Once | INTRAVENOUS | Status: AC | PRN
  Administered 2018-11-02: 500 [IU]
  Filled 2018-11-02: qty 5

## 2018-11-02 MED ORDER — ACETAMINOPHEN 325 MG PO TABS
ORAL_TABLET | ORAL | Status: AC
  Filled 2018-11-02: qty 2

## 2018-11-02 MED ORDER — SODIUM CHLORIDE 0.9 % IV SOLN
420.0000 mg | Freq: Once | INTRAVENOUS | Status: AC
  Administered 2018-11-02: 420 mg via INTRAVENOUS
  Filled 2018-11-02: qty 14

## 2018-11-02 MED ORDER — SODIUM CHLORIDE 0.9% FLUSH
10.0000 mL | INTRAVENOUS | Status: DC | PRN
  Administered 2018-11-02: 10 mL
  Filled 2018-11-02: qty 10

## 2018-11-02 MED ORDER — DIPHENHYDRAMINE HCL 50 MG/ML IJ SOLN
25.0000 mg | Freq: Once | INTRAMUSCULAR | Status: AC
  Administered 2018-11-02: 25 mg via INTRAVENOUS

## 2018-11-02 MED ORDER — DIPHENHYDRAMINE HCL 50 MG/ML IJ SOLN
INTRAMUSCULAR | Status: AC
  Filled 2018-11-02: qty 1

## 2018-11-02 NOTE — Patient Instructions (Signed)
[facility] Discharge Instructions for Patients Receiving Chemotherapy  Today you received the following chemotherapy agents Herceptin and Perjeta.   To help prevent nausea and vomiting after your treatment, we encourage you to take your nausea medication as directed.   If you develop nausea and vomiting that is not controlled by your nausea medication, call the clinic.   BELOW ARE SYMPTOMS THAT SHOULD BE REPORTED IMMEDIATELY:  *FEVER GREATER THAN 100.5 F  *CHILLS WITH OR WITHOUT FEVER  NAUSEA AND VOMITING THAT IS NOT CONTROLLED WITH YOUR NAUSEA MEDICATION  *UNUSUAL SHORTNESS OF BREATH  *UNUSUAL BRUISING OR BLEEDING  TENDERNESS IN MOUTH AND THROAT WITH OR WITHOUT PRESENCE OF ULCERS  *URINARY PROBLEMS  *BOWEL PROBLEMS  UNUSUAL RASH Items with * indicate a potential emergency and should be followed up as soon as possible.  Feel free to call the clinic should you have any questions or concerns. The clinic phone number is [phone].  Please show the CHEMO ALERT CARD at check-in to the Emergency Department and triage nurse.   

## 2018-11-02 NOTE — Addendum Note (Signed)
Addended by: [name] on: 11/02/2018 10:11 AM   Modules accepted: Orders

## 2018-11-02 NOTE — Telephone Encounter (Signed)
Scheduled appt per 4/16 los. °

## 2018-11-03 ENCOUNTER — Telehealth: Payer: Self-pay | Admitting: Hematology

## 2018-11-03 ENCOUNTER — Other Ambulatory Visit: Payer: 59

## 2018-11-03 ENCOUNTER — Ambulatory Visit: Payer: 59

## 2018-11-03 ENCOUNTER — Ambulatory Visit: Payer: 59 | Admitting: Hematology

## 2018-11-03 ENCOUNTER — Telehealth: Payer: Self-pay

## 2018-11-03 LAB — CANCER ANTIGEN 27.29: CA 27.29: 59.1 U/mL — ABNORMAL HIGH (ref 0.0–38.6)

## 2018-11-03 NOTE — Telephone Encounter (Signed)
-----   Message from [name], MD sent at 11/03/2018  2:27 PM EDT ----- Please let pt know her Cr was slightly elevated yesterday, encourage her to drink more water, thanks  [name]  11/03/2018

## 2018-11-03 NOTE — Telephone Encounter (Signed)
Per sch msg, cancelled 4/24 appts due to them not being correct. Moved 5/7 and 5/28 appts to the following Friday. Called and spoke with patient and notified of changes. Confirmed dates and times

## 2018-11-03 NOTE — Telephone Encounter (Signed)
Pt. Notified per Dr. Burr Medico that her Creatinine level was slightly elevated and she should drink more water. Pt. Verbalized understanding.

## 2018-11-08 DIAGNOSIS — C50919 Malignant neoplasm of unspecified site of unspecified female breast: Secondary | ICD-10-CM | POA: Diagnosis not present

## 2018-11-08 DIAGNOSIS — I10 Essential (primary) hypertension: Secondary | ICD-10-CM | POA: Diagnosis not present

## 2018-11-08 DIAGNOSIS — R030 Elevated blood-pressure reading, without diagnosis of hypertension: Secondary | ICD-10-CM | POA: Diagnosis not present

## 2018-11-08 MED FILL — GABAPENTIN 300 MG CAPSULE: 300 | 30 days supply | Qty: 90 | Fill #0

## 2018-11-10 ENCOUNTER — Other Ambulatory Visit: Payer: Self-pay | Admitting: Hematology

## 2018-11-10 ENCOUNTER — Other Ambulatory Visit: Payer: 59

## 2018-11-10 ENCOUNTER — Ambulatory Visit: Payer: 59

## 2018-11-10 ENCOUNTER — Other Ambulatory Visit: Payer: Self-pay | Admitting: *Deleted

## 2018-11-10 DIAGNOSIS — E876 Hypokalemia: Secondary | ICD-10-CM

## 2018-11-10 MED ORDER — GABAPENTIN 300 MG PO CAPS: 300.0000 mg | ORAL_CAPSULE | Freq: Three times a day (TID) | ORAL | 1 refills | Status: DC

## 2018-11-10 MED FILL — LETROZOLE 2.5 MG TABLET: 2.5 | 30 days supply | Qty: 30 | Fill #1

## 2018-11-23 ENCOUNTER — Other Ambulatory Visit: Payer: 59

## 2018-11-23 ENCOUNTER — Ambulatory Visit: Payer: 59

## 2018-11-24 ENCOUNTER — Inpatient Hospital Stay: Payer: 59 | Attending: Hematology

## 2018-11-24 ENCOUNTER — Inpatient Hospital Stay: Payer: 59

## 2018-11-24 ENCOUNTER — Other Ambulatory Visit: Payer: Self-pay

## 2018-11-24 VITALS — BP 138/66 | HR 72 | Temp 98.6°F | Resp 16 | Wt 138.5 lb

## 2018-11-24 DIAGNOSIS — Z170 Estrogen receptor positive status [ER+]: Secondary | ICD-10-CM | POA: Insufficient documentation

## 2018-11-24 DIAGNOSIS — M7989 Other specified soft tissue disorders: Secondary | ICD-10-CM | POA: Diagnosis not present

## 2018-11-24 DIAGNOSIS — Z7901 Long term (current) use of anticoagulants: Secondary | ICD-10-CM | POA: Diagnosis not present

## 2018-11-24 DIAGNOSIS — C50911 Malignant neoplasm of unspecified site of right female breast: Secondary | ICD-10-CM | POA: Insufficient documentation

## 2018-11-24 DIAGNOSIS — K219 Gastro-esophageal reflux disease without esophagitis: Secondary | ICD-10-CM | POA: Insufficient documentation

## 2018-11-24 DIAGNOSIS — Z5112 Encounter for antineoplastic immunotherapy: Secondary | ICD-10-CM | POA: Insufficient documentation

## 2018-11-24 DIAGNOSIS — J329 Chronic sinusitis, unspecified: Secondary | ICD-10-CM | POA: Diagnosis not present

## 2018-11-24 DIAGNOSIS — Z79811 Long term (current) use of aromatase inhibitors: Secondary | ICD-10-CM | POA: Insufficient documentation

## 2018-11-24 DIAGNOSIS — C50919 Malignant neoplasm of unspecified site of unspecified female breast: Secondary | ICD-10-CM

## 2018-11-24 DIAGNOSIS — Z86718 Personal history of other venous thrombosis and embolism: Secondary | ICD-10-CM | POA: Diagnosis not present

## 2018-11-24 DIAGNOSIS — R0981 Nasal congestion: Secondary | ICD-10-CM | POA: Insufficient documentation

## 2018-11-24 DIAGNOSIS — I10 Essential (primary) hypertension: Secondary | ICD-10-CM | POA: Diagnosis not present

## 2018-11-24 DIAGNOSIS — Z79899 Other long term (current) drug therapy: Secondary | ICD-10-CM | POA: Diagnosis not present

## 2018-11-24 DIAGNOSIS — G620 Drug-induced polyneuropathy: Secondary | ICD-10-CM | POA: Diagnosis not present

## 2018-11-24 DIAGNOSIS — D6481 Anemia due to antineoplastic chemotherapy: Secondary | ICD-10-CM | POA: Diagnosis not present

## 2018-11-24 DIAGNOSIS — Z7189 Other specified counseling: Secondary | ICD-10-CM

## 2018-11-24 DIAGNOSIS — C787 Secondary malignant neoplasm of liver and intrahepatic bile duct: Secondary | ICD-10-CM | POA: Diagnosis not present

## 2018-11-24 DIAGNOSIS — Z95828 Presence of other vascular implants and grafts: Secondary | ICD-10-CM

## 2018-11-24 LAB — CMP (CANCER CENTER ONLY)
ALT: 18 U/L (ref 0–44)
AST: 24 U/L (ref 15–41)
Albumin: 3.7 g/dL (ref 3.5–5.0)
Alkaline Phosphatase: 92 U/L (ref 38–126)
Anion gap: 10 (ref 5–15)
BUN: 19 mg/dL (ref 8–23)
CO2: 23 mmol/L (ref 22–32)
Calcium: 9.4 mg/dL (ref 8.9–10.3)
Chloride: 104 mmol/L (ref 98–111)
Creatinine: 1.09 mg/dL — ABNORMAL HIGH (ref 0.44–1.00)
GFR, Est AFR Am: >60 mL/min (ref >60)
GFR, Estimated: 54 mL/min — ABNORMAL LOW (ref >60)
Glucose, Bld: 93 mg/dL (ref 70–99)
Potassium: 3.6 mmol/L (ref 3.5–5.1)
Sodium: 137 mmol/L (ref 135–145)
Total Bilirubin: 0.3 mg/dL (ref 0.3–1.2)
Total Protein: 7 g/dL (ref 6.5–8.1)

## 2018-11-24 LAB — CBC WITH DIFFERENTIAL (CANCER CENTER ONLY)
Abs Immature Granulocytes: 0.01 10*3/uL (ref 0.00–0.07)
Basophils Absolute: 0.1 10*3/uL (ref 0.0–0.1)
Basophils Relative: 1 %
Eosinophils Absolute: 0.8 10*3/uL — ABNORMAL HIGH (ref 0.0–0.5)
Eosinophils Relative: 12 %
HCT: 32.3 % — ABNORMAL LOW (ref 36.0–46.0)
Hemoglobin: 10.2 g/dL — ABNORMAL LOW (ref 12.0–15.0)
Immature Granulocytes: 0 %
Lymphocytes Relative: 33 %
Lymphs Abs: 2 10*3/uL (ref 0.7–4.0)
MCH: 27.2 pg (ref 26.0–34.0)
MCHC: 31.6 g/dL (ref 30.0–36.0)
MCV: 86.1 fL (ref 80.0–100.0)
Monocytes Absolute: 0.3 10*3/uL (ref 0.1–1.0)
Monocytes Relative: 6 %
Neutro Abs: 2.9 10*3/uL (ref 1.7–7.7)
Neutrophils Relative %: 48 %
Platelet Count: 196 10*3/uL (ref 150–400)
RBC: 3.75 MIL/uL — ABNORMAL LOW (ref 3.87–5.11)
RDW: 17 % — ABNORMAL HIGH (ref 11.5–15.5)
WBC Count: 6 10*3/uL (ref 4.0–10.5)
nRBC: 0 % (ref 0.0–0.2)

## 2018-11-24 MED ORDER — DIPHENHYDRAMINE HCL 50 MG/ML IJ SOLN
INTRAMUSCULAR | Status: AC
  Filled 2018-11-24: qty 1

## 2018-11-24 MED ORDER — SODIUM CHLORIDE 0.9 % IV SOLN
Freq: Once | INTRAVENOUS | Status: AC
  Administered 2018-11-24: 10:00:00 via INTRAVENOUS
  Filled 2018-11-24: qty 250

## 2018-11-24 MED ORDER — DIPHENHYDRAMINE HCL 50 MG/ML IJ SOLN
25.0000 mg | Freq: Once | INTRAMUSCULAR | Status: AC
  Administered 2018-11-24: 25 mg via INTRAVENOUS

## 2018-11-24 MED ORDER — HEPARIN SOD (PORK) LOCK FLUSH 100 UNIT/ML IV SOLN
500.0000 [IU] | Freq: Once | INTRAVENOUS | Status: AC | PRN
  Administered 2018-11-24: 500 [IU]
  Filled 2018-11-24: qty 5

## 2018-11-24 MED ORDER — ACETAMINOPHEN 325 MG PO TABS
ORAL_TABLET | ORAL | Status: AC
  Filled 2018-11-24: qty 2

## 2018-11-24 MED ORDER — SODIUM CHLORIDE 0.9 % IV SOLN
420.0000 mg | Freq: Once | INTRAVENOUS | Status: AC
  Administered 2018-11-24: 420 mg via INTRAVENOUS
  Filled 2018-11-24: qty 14

## 2018-11-24 MED ORDER — ACETAMINOPHEN 325 MG PO TABS
650.0000 mg | ORAL_TABLET | Freq: Once | ORAL | Status: AC
  Administered 2018-11-24: 650 mg via ORAL

## 2018-11-24 MED ORDER — SODIUM CHLORIDE 0.9% FLUSH
10.0000 mL | INTRAVENOUS | Status: DC | PRN
  Administered 2018-11-24: 10 mL
  Filled 2018-11-24: qty 10

## 2018-11-24 MED ORDER — TRASTUZUMAB CHEMO 150 MG IV SOLR
6.0000 mg/kg | Freq: Once | INTRAVENOUS | Status: AC
  Administered 2018-11-24: 10:00:00 399 mg via INTRAVENOUS
  Filled 2018-11-24: qty 19

## 2018-11-24 NOTE — Patient Instructions (Signed)
[facility] Discharge Instructions for Patients Receiving Chemotherapy  Today you received the following chemotherapy agents Trastuzumab (HERCEPTIN) & Pertuzumab (PERJETA).  To help prevent nausea and vomiting after your treatment, we encourage you to take your nausea medication as prescribed.   If you develop nausea and vomiting that is not controlled by your nausea medication, call the clinic.   BELOW ARE SYMPTOMS THAT SHOULD BE REPORTED IMMEDIATELY:  *FEVER GREATER THAN 100.5 F  *CHILLS WITH OR WITHOUT FEVER  NAUSEA AND VOMITING THAT IS NOT CONTROLLED WITH YOUR NAUSEA MEDICATION  *UNUSUAL SHORTNESS OF BREATH  *UNUSUAL BRUISING OR BLEEDING  TENDERNESS IN MOUTH AND THROAT WITH OR WITHOUT PRESENCE OF ULCERS  *URINARY PROBLEMS  *BOWEL PROBLEMS  UNUSUAL RASH Items with * indicate a potential emergency and should be followed up as soon as possible.  Feel free to call the clinic should you have any questions or concerns. The clinic phone number is [phone].  Please show the [facility] at check-in to the Emergency Department and triage nurse.  Coronavirus (COVID-19) Are you at risk?  Are you at risk for the Coronavirus (COVID-19)?  To be considered HIGH RISK for Coronavirus (COVID-19), you have to meet the following criteria:  . Traveled to [place], [place], [place], [place] or [place]; or in the [place] to [place], [place], [employer], or [place]; and have fever, cough, and shortness of breath within the last 2 weeks of travel OR . Been in close contact with a person diagnosed with COVID-19 within the last 2 weeks and have fever, cough, and shortness of breath . IF YOU DO NOT MEET THESE CRITERIA, YOU ARE CONSIDERED LOW RISK FOR COVID-19.  What to do if you are HIGH RISK for COVID-19?  Marland Kitchen If you are having a medical emergency, call 911. . Seek medical care right away. Before you go to a doctor's office, urgent care or emergency department,  call ahead and tell them about your recent travel, contact with someone diagnosed with COVID-19, and your symptoms. You should receive instructions from your physician's office regarding next steps of care.  . When you arrive at healthcare provider, tell the healthcare staff immediately you have returned from visiting [place], [place], [place], [place] or [place]; or traveled in the [place] to [place], [place], [place], or [place]; in the last two weeks or you have been in close contact with a person diagnosed with COVID-19 in the last 2 weeks.   . Tell the health care staff about your symptoms: fever, cough and shortness of breath. . After you have been seen by a medical provider, you will be either: o Tested for (COVID-19) and discharged home on quarantine except to seek medical care if symptoms worsen, and asked to  - Stay home and avoid contact with others until you get your results (4-5 days)  - Avoid travel on public transportation if possible (such as bus, train, or airplane) or o Sent to the Emergency Department by EMS for evaluation, COVID-19 testing, and possible admission depending on your condition and test results.  What to do if you are LOW RISK for COVID-19?  Reduce your risk of any infection by using the same precautions used for avoiding the common cold or flu:  Marland Kitchen Wash your hands often with soap and warm water for at least 20 seconds.  If soap and water are not readily available, use an alcohol-based hand sanitizer with at least 60% alcohol.  Marland Kitchen  If coughing or sneezing, cover your mouth and nose by coughing or sneezing into the elbow areas of your shirt or coat, into a tissue or into your sleeve (not your hands). . Avoid shaking hands with others and consider head nods or verbal greetings only. . Avoid touching your eyes, nose, or mouth with unwashed hands.  . Avoid close contact with people who are sick. . Avoid places or events with large numbers of people in one  location, like concerts or sporting events. . Carefully consider travel plans you have or are making. . If you are planning any travel outside or inside the [place], visit the CDC's Travelers' Health webpage for the latest health notices. . If you have some symptoms but not all symptoms, continue to monitor at home and seek medical attention if your symptoms worsen. . If you are having a medical emergency, call 911.   ADDITIONAL HEALTHCARE OPTIONS FOR PATIENTS  [facility] / e-Visit: [email or web address]         [facility]: [phone]  [facility]: [phone]                   [facility]: [phone]   

## 2018-11-27 ENCOUNTER — Other Ambulatory Visit: Payer: Self-pay | Admitting: Nurse Practitioner

## 2018-11-27 ENCOUNTER — Other Ambulatory Visit: Payer: Self-pay | Admitting: Hematology

## 2018-11-27 MED ORDER — TRAMADOL HCL 50 MG PO TABS: 50.0000 mg | ORAL_TABLET | Freq: Four times a day (QID) | ORAL | 0 refills | Status: DC | PRN

## 2018-11-27 NOTE — Telephone Encounter (Signed)
Dr. Burr Medico,  This patient is requesting refill og her Tramadol.. Last filled by you in January 2020 for #30.  Thanks, Drucie Ip, RN

## 2018-11-28 MED FILL — XARELTO 20 MG TABLET: 20 | 30 days supply | Qty: 30 | Fill #0

## 2018-11-28 MED FILL — GABAPENTIN 300 MG CAPSULE: 300 | 30 days supply | Qty: 120 | Fill #0

## 2018-11-29 ENCOUNTER — Telehealth: Payer: Self-pay | Admitting: Nurse Practitioner

## 2018-11-29 NOTE — Telephone Encounter (Signed)
I spoke with Ms. Rhinesmith about her 2 week history of cough. Her chest feels "stuffed up" with sinus drainage and pressure when she bends over. Sputum is white. Denies fever, chills, chest pain, dyspnea, wheezing. No known sick/covid contacts. Has tried mucinex but not helping much. Only new med on her list is letrozole in 10/2018. Cough is listed on side effect profile (6-13%), question if this is contributing. She has spent time outside walking in her community. Could be allergy-related. I suggest she start allergy medication daily and call us with an update later this week. If not better or worse may get chest xray to r/o infection or pleural effusion. She verbalizes understanding and appreciates the call.  Cira Rue, NP 11/29/2018

## 2018-11-29 NOTE — Telephone Encounter (Signed)
I called patient to discuss her cough which she reported to call center on 5/12. No answer. No ID on VM so I did not leave a message. Will retry to reach patient.  Cira Rue, NP  11/29/18

## 2018-12-11 ENCOUNTER — Other Ambulatory Visit: Payer: Self-pay | Admitting: Hematology

## 2018-12-11 DIAGNOSIS — E876 Hypokalemia: Secondary | ICD-10-CM

## 2018-12-11 MED FILL — LETROZOLE 2.5 MG TABLET: 2.5 | 30 days supply | Qty: 30 | Fill #2

## 2018-12-13 NOTE — Progress Notes (Signed)
[place]   Telephone:([phone]) [phone] Fax:(336) [phone]   Clinic Follow up Note   Patient Care Team: Levin Bacon, NP as PCP - General (Family Medicine) [name], MD as Consulting Physician (Gastroenterology) [name], MD as Consulting Physician (Hematology)  Date of Service:  12/15/2018  CHIEF COMPLAINT: F/u for metastatic breast cancer  SUMMARY OF ONCOLOGIC HISTORY: Oncology History   Cancer Staging Metastatic breast cancer [facility] Staging form: Breast, AJCC 8th Edition - Clinical stage from 03/24/2018: Stage IV (cT2, cN1, pM1, G3, ER+, PR+, HER2+) - Signed by [name], MD on 03/30/2018       Metastatic breast cancer ([place])   01/30/2018 Procedure    Colonoscopy showed small polyp in the sigmoid colon, removed, the exam of colon including the terminal ileum was otherwise negative.    01/30/2018 Procedure    EGD by Dr. Collene Mares showed small hiatal hernia, a 8 mm polypoid lesion in the cardia, biopsied.    03/09/2018 Imaging    03/09/2018 US Abdomen IMPRESSION: 1. Mass lesions throughout the liver, consistent with metastatic disease. Liver as a somewhat nodular contour suggesting underlying hepatic cirrhosis. Inhomogeneous echotexture to the liver.  2. Cholelithiasis with mild gallbladder wall thickening. A degree of cholecystitis cannot be excluded by ultrasound.  3. Portions of pancreas obscured by gas. Visualized portions of pancreas appear normal.  4. Small right kidney. Etiology uncertain. This finding potentially may be indicative of renal artery stenosis. In this regard, question whether patient is hypertensive.    03/15/2018 Imaging    CT CAP with contrast  IMPRESSION: 1. Widespread hepatic metastasis. 2. 2.6 cm lateral right breast soft tissue nodule could represent a breast primary or an incidental benign lesion. Consider correlation with mammogram and ultrasound. 3. No definite source of primary malignancy identified within the abdomen or pelvis.  There is possible rectosigmoid junction wall thickening. Consider colonoscopy with attention to this area. 4. Distal esophageal wall thickening, suggesting esophagitis.    03/24/2018 Cancer Staging    Staging form: Breast, AJCC 8th Edition - Clinical stage from 03/24/2018: Stage IV (cT2, cN1, pM1, G3, ER+, PR+, HER2+) - Signed by [name], MD on 03/30/2018    03/24/2018 Initial Biopsy    Diagnosis 1. 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 Breast, right, needle core biopsy, 9 o'clock, 7cm from nipple - INVASIVE DUCTAL CARCINOMA. -The carcinoma is somewhat morphologically dissimilar from that in part 1. It appears grade III 3. Lymph node, needle/core biopsy, right axillary - METASTATIC CARCINOMA IN 1 OF 1 LYMPH NODE (1/1).    03/24/2018 Receptors her2    Breast biopsy: 1. Estrogen Receptor: 40%, POSITIVE, STRONG-MODERATE STAINING INTENSITY Progesterone Receptor: 70%, POSITIVE, STRONG STAINING INTENSITY Proliferation Marker Ki67: 20% HER 2 equivocal by IHC 2+, POSITIVE by FISH, ratio 2.4 and copy #4.2  2. Estrogen Receptor: 60%, POSITIVE, MODERATE STAINING INTENSITY Progesterone Receptor: 40%, POSITIVE, MODERATE STAINING INTENSITY Proliferation Marker Ki67: 20% HER2 (+) by IHC 3+    03/24/2018 Initial Diagnosis    Metastatic breast cancer ([place])    03/27/2018 Pathology Results    Diagnosis Liver, needle/core biopsy, Right - METASTATIC CARCINOMA TO LIVER, CONSISTENT WITH PATIENTS CLINICAL HISTORY OF PRIMARY BREAST CARCINOMA.  ER 80%+ PR40%+ HER2- (by [facility], IHC 2+)  Ki67 50%     03/28/2018 Pathology Results    03/28/2018 Surgical Pathology Diagnosis 1. Breast, left, needle core biopsy, 9 o'clock - FIBROCYSTIC CHANGES. - THERE  IS NO EVIDENCE OF MALIGNANCY. 2. Breast, left, needle core biopsy, 2 o'clock - FIBROADENOMA. - THERE IS NO EVIDENCE OF MALIGNANCY. - SEE COMMENT.    03/29/2018  Imaging    03/29/2018 Bone Scan IMPRESSION: No scintigraphic evidence of osseous metastatic disease.    03/30/2018 Imaging    Bone scan  IMPRESSION: No scintigraphic evidence of osseous metastatic disease.     04/07/2018 -  Chemotherapy    First line chemo weekly Taxol and herceptin/Perjeta every 3 weeks starting 04/07/18. She developed infusion reaction to taxol and it was discontinued. Added Abraxane on C1D8, 2 weeks on/1 week off.  Abraxne stopped after 10/13/18 due to worsening Neuropathy    06/06/2018 Imaging    CT CAP IMPRESSION: 1. Generally improved appearance, with reduced axillary adenopathy and reduced enhancing component of the hepatic masses, with some of the hepatic mass is moderately smaller than on the prior exam. Reduced size of the right lateral breast mass compared to the prior 03/15/2018 exam. 2. New mild interstitial accentuation in the lungs, significance uncertain. Part of this appearance may be due to lower lung volumes on today's exam. 3. Mild wall thickening in the descending colon and upper rectum suggesting low-grade colitis/inflammation. Prominent stool throughout the colon favors constipation. 4. Other imaging findings of potential clinical significance: Aortic Atherosclerosis (ICD10-I70.0). Mild cardiomegaly. Mild nodularity in the right lower lobe appears stable. Contracted and thick-walled gallbladder.     09/18/2018 Imaging    CT CAP W Contrast 09/18/18  IMPRESSION: 1. Liver metastases have decreased in size. 2. Right breast mass, mild right axillary lymphadenopathy and scattered tiny right pulmonary nodules are all stable. 3. New mild left supraclavicular and left subpectoral lymphadenopathy, can not exclude progression of metastatic nodal disease. 4. Moderate colorectal stool volume, which may indicate constipation. 5.  Aortic Atherosclerosis (ICD10-I70.0).    10/2018 -  Anti-estrogen oral therapy    Letrozole 2.5 mg daily starting 10/2018       CURRENT THERAPY:  -First line weekly TaxolwithHerceptin/Perjeta every 3 weeks starting 04/07/18. She developed infusion reaction to taxol and it was discontinued. Changed to Abraxaneweekly2 weeks on/1 week offfrom 07/21/2018.Abraxane held after October 13, 2018 due to his worsening neuropathy -Letrozole 2.5 mg daily starting 10/2018  INTERVAL HISTORY:  Teresa Hays is here for a follow up and treatment. She presents to the clinic alone. She notes she has the constant feeling of something in her throat when she coughs. She does cough up phlegm which is clear. This has been going on for 3 weeks. No fever at home, no chills felt or SOB. She feel congestion of her nasal cavity and chest discomfort. She denies coughing up blood.   She feels well other than the neuropathy in her right foot. This is significant and effects her sleep. This has lead to swelling as well in the right foot. She notes she is still taking blood thinner with no issues.  She takes Gabapentin 162m in the Am and evening and 2040mat night. She takes Tramadol at right to help her pain but still hurts.    REVIEW OF SYSTEMS:   Constitutional: Denies fevers, chills or abnormal weight loss Eyes: Denies blurriness of vision Ears, nose, mouth, throat, and face: Denies mucositis or sore throat Respiratory: Denies dyspnea or wheezes (+) cough and congestion with clear phlegm Cardiovascular: Denies palpitation(+) chest discomfort form cough (+) Right foot swelling Gastrointestinal:  Denies nausea, heartburn or change in bowel habits Skin: Denies abnormal skin rashes Lymphatics: Denies new lymphadenopathy  or easy bruising Neurological: Significant neuropathy of feet, R>L.  Behavioral/Psych: Mood is stable, no new changes  All other systems were reviewed with the patient and are negative.  MEDICAL HISTORY:  Past Medical History:  Diagnosis Date  . GERD (gastroesophageal reflux disease)   . Hypertension   . rt breast ca with  mets to liver dx'd 03/2018    SURGICAL HISTORY: Past Surgical History:  Procedure Laterality Date  . COLONOSCOPY    . ESOPHAGOGASTRODUODENOSCOPY ENDOSCOPY    . IR IMAGING GUIDED PORT INSERTION  04/04/2018    I have reviewed the social history and family history with the patient and they are unchanged from previous note.  ALLERGIES:  has No Known Allergies.  MEDICATIONS:  Current Outpatient Medications  Medication Sig Dispense Refill  . amLODipine (NORVASC) 5 MG tablet Take 5 mg by mouth daily.     Marland Kitchen atenolol (TENORMIN) 50 MG tablet Take 50 mg by mouth daily.     . diphenoxylate-atropine (LOMOTIL) 2.5-0.025 MG tablet Take 2 tablets by mouth 4 (four) times daily as needed for diarrhea or loose stools. 30 tablet 0  . gabapentin (NEURONTIN) 300 MG capsule Take 1 capsule (300 mg total) by mouth 3 (three) times daily. (Patient taking differently: Take 300 mg by mouth 3 (three) times daily. Takes 1 q am and 1 pm and 2 q hs) 120 capsule 1  . letrozole (FEMARA) 2.5 MG tablet Take 1 tablet (2.5 mg total) by mouth daily. 30 tablet 3  . lidocaine-prilocaine (EMLA) cream Apply to affected area once 30 g 3  . LORazepam (ATIVAN) 0.5 MG tablet Take 1 tablet (0.5 mg total) by mouth at bedtime as needed for anxiety. 30 tablet 0  . omeprazole (PRILOSEC) 20 MG capsule Take 1 capsule (20 mg total) by mouth daily. 30 capsule 3  . ondansetron (ZOFRAN) 8 MG tablet Take 1 tablet (8 mg total) by mouth 2 (two) times daily as needed (Nausea or vomiting). 30 tablet 1  . potassium chloride SA (K-DUR) 20 MEQ tablet Take 1 tablet by mouth once daily 30 tablet 0  . rivaroxaban (XARELTO) 20 MG TABS tablet Take 1 tablet (20 mg total) by mouth daily with supper. 30 tablet 5  . traMADol (ULTRAM) 50 MG tablet Take 1 tablet (50 mg total) by mouth every 6 (six) hours as needed for moderate pain or severe pain. 30 tablet 0  . furosemide (LASIX) 20 MG tablet Take 1 tablet (20 mg total) by mouth daily. 30 tablet 0   No current  facility-administered medications for this visit.    Facility-Administered Medications Ordered in Other Visits  Medication Dose Route Frequency Provider Last Rate Last Dose  . heparin lock flush 100 unit/mL  500 Units Intracatheter Once PRN [name], MD      . pertuzumab (PERJETA) 420 mg in sodium chloride 0.9 % 250 mL chemo infusion  420 mg Intravenous Once [name], MD      . sodium chloride flush (NS) 0.9 % injection 10 mL  10 mL Intracatheter PRN [name], MD      . trastuzumab (HERCEPTIN) 399 mg in sodium chloride 0.9 % 250 mL chemo infusion  6 mg/kg (Treatment Plan Recorded) Intravenous Once [name], MD 538 mL/hr at 12/15/18 1603 399 mg at 12/15/18 1603    PHYSICAL EXAMINATION: ECOG PERFORMANCE STATUS: 2 - Symptomatic, <50% confined to bed  Vitals:   12/15/18 1417  BP: (!) 143/81  Pulse: 65  Resp: 17  Temp: 98.9 F (37.2  C)  SpO2: 100%   Filed Weights   12/15/18 1417  Weight: 138 lb 3.2 oz (62.7 kg)   GENERAL:alert, no distress and comfortable SKIN: skin color, texture, turgor are normal, no rashes or significant lesions EYES: normal, Conjunctiva are pink and non-injected, sclera clear NECK: supple, thyroid normal size, non-tender, without nodularity LYMPH:  no palpable lymphadenopathy in the cervical, axillary  LUNGS: clear percussion   (+) B/l rhonchi in lungs  HEART: regular rate & rhythm and no murmurs (+) right foot edema, tenderness at right ankle  ABDOMEN:abdomen soft, non-tender and normal bowel sounds Musculoskeletal:no cyanosis of digits and no clubbing  NEURO: alert & oriented x 3 with fluent speech, no focal motor/sensory deficits  LABORATORY DATA:  I have reviewed the data as listed CBC Latest Ref Rng & Units 12/15/2018 11/24/2018 11/02/2018  WBC 4.0 - 10.5 K/uL 5.4 6.0 6.2  Hemoglobin 12.0 - 15.0 g/dL 10.1(L) 10.2(L) 10.2(L)  Hematocrit 36.0 - 46.0 % 32.2(L) 32.3(L) 32.7(L)  Platelets 150 - 400 K/uL 224 196 202     CMP Latest Ref Rng & Units  12/15/2018 11/24/2018 11/02/2018  Glucose 70 - 99 mg/dL 91 93 112(H)  BUN 8 - 23 mg/dL _0 Creatinine 0.44 - 1.00 mg/dL 1.02(H) 1.09(H) 1.20(H)  Sodium 135 - 145 mmol/L 139 137 139  Potassium 3.5 - 5.1 mmol/L 3.9 3.6 3.6  Chloride 98 - 111 mmol/L 105 104 106  CO2 22 - 32 mmol/L _1 Calcium 8.9 - 10.3 mg/dL 9.4 9.4 9.2  Total Protein 6.5 - 8.1 g/dL 7.1 7.0 7.0  Total Bilirubin 0.3 - 1.2 mg/dL 0.4 0.3 0.3  Alkaline Phos 38 - 126 U/L 106 92 98  AST 15 - 41 U/L _2 ALT 0 - 44 U/L _3 RADIOGRAPHIC STUDIES: I have personally reviewed the radiological images as listed and agreed with the findings in the report. Dg Chest 2 View  Result Date: 12/15/2018 CLINICAL DATA:  Upper respiratory tract infection, cough, breast cancer EXAM: CHEST - 2 VIEW COMPARISON:  09/18/2018 chest CT FINDINGS: Left internal jugular Port-A-Cath terminates in lower third of the SVC. Stable cardiomediastinal silhouette with normal heart size. No pneumothorax. No pleural effusion. Lungs appear clear, with no acute consolidative airspace disease and no pulmonary edema. IMPRESSION: No active cardiopulmonary disease. Electronically Signed   By: Ilona Sorrel M.D.   On: 12/15/2018 15:20     ASSESSMENT & PLAN:  Teresa Hays is a 63 y.o. female with   1. Metastatic right breast cancer to liver, cT2N1M1, stage IV, ER+/PR+/HER2+, Liver mets ER+/PR+/HER2- -She was diagnosed in 03/2018.She presented with diffuse liver metastasis, tworight breast massesand right axillary adenopathy. Breast mass biopsy showed ER PR positive, but 1 was HER-2 positive, the other one was HER-2 negative. Liver met was HER2-.  -Given the metastatic disease, her cancer is not curable but still treatable. She has been treated with First-line Herceptin/Perjetaevery 3 weeks and weekly Taxol. Due to infusionreaction, taxol was switched to Abraxanefor 2 weeks on/1 week off.Tolerating well. We stopped Abraxane after cycle 10  due to worsening neuropathy.  -Her right breast mass is no longer palpable on exam, she continues to show good response. -She has started anti-estrogen therapy with Letrozole in April 2020 and is tolerating well. Will continue letrozole daily and Herceptin/Perjeta every 3 weeks for as long as it controls her disease. She is agreeable.  -From a breast cancer standpoint she is  stable. Tolerating treatment well.  -Labs reviewed, CBC and CMP WNL except Hg 10.1, Cr 1.02. Ca 27.29 still pending. Overall adequate to proceed with Herceptin and Perjeta today and continue every 3 weeks  -I strongly encouraged her to drink plenty of water to avoid dehydration.  -Continue Letrozole daily.  -F/u in 3 weeks    2. Cough with clear phlegm and nasal congestion, URI   -Has been ongoing for 3 weeks, afebrile.  -She has had Sinus infection in the past. 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  -She has moderate tosignificantsensory deficit on exam  -I have stropped Abraxane on 10/13/18.  -I previously encouraged her to take Vitamin B12 daily or Prenatal Vitamin that has B6.  -She continues to have worsening neuropathy mainly in her feet. She has increased gabapentin to 309mbid and 60052mS, will continue . She is also taking Tramadol nightly as needed for significant pain.   4.Left UEDVT, Right LE edema -she developed LUE DVT on 08/04/2018, edema has resolved after anticoagulation  -Continue onXarelto 63m11mdefinitely, unless she has significant bleeding or other side effects. -She previously has developed LE edema, right significantly more than left. 10/13/18 doppler was negative for DVT. She has been using compression socks and has not skipped  her Xarelto. However her edema remains and tenderness at her ankle on exam today (12/15/18) -I will call in Lasix to take once daily for 1 week to bring down her swelling and relieve pressure of right foot. She can cut to half tablets a day if not tolerable. She will take her oral potasium with Lasix. I also advised her to get tighter compression socks.   5. Anemia -she developed worseninganemia since she started chemotherapy -Continue monitoring, will consider blood transfusion if hemoglobin less than 8, always symptomatic anemia. -stable, hg at 10.1 today (12/15/18)   6Social support  -She lives alone, [place] away  -On Ativan PRN due to anxietyabout diagnosis and treatment  7. Goal of care discussion  -she is full code -She understands the goal of care is palliative, to prolong her life, and improve her quality of life.   Plan: -I called her Lasix today to take once daily for 1 week  -Chest Xray today, which was negative, I discussed with pt  -continue Letrozole, I will call in Augmentin for her -Labs reviewed and adequate to proceed with Herceptin/Perjeta today  -Lab, flush f/u and Herceptin/Perjeta in 3 weeks    No problem-specific Assessment & Plan notes found for this encounter.   Orders Placed This Encounter  Procedures  . DG Chest 2 View    Standing Status:   Future    Number of Occurrences:   1    Standing Expiration Date:   12/15/2019    Order Specific Question:   Reason for Exam (SYMPTOM  OR DIAGNOSIS REQUIRED)    Answer:   cough and chest congestion, rule out infection    Order Specific Question:   [facility]    [facility]:   Radiology Contrast Protocol - do NOT remove file path    Answer:   _0 [email or web address]  All questions were answered. The patient knows to call the clinic with any  problems, questions or concerns. No barriers to learning was detected. I spent 25 minutes counseling the patient face to face. The total time spent in the appointment was 30 minutes and more than 50% was on counseling and review of test results     [name], MD 12/15/2018   I, Joslyn Devon, am acting as scribe for [name], MD.   I have reviewed the above documentation for accuracy and completeness, and I agree with the above.

## 2018-12-14 ENCOUNTER — Ambulatory Visit: Payer: 59 | Admitting: Hematology

## 2018-12-14 ENCOUNTER — Other Ambulatory Visit: Payer: 59

## 2018-12-14 ENCOUNTER — Ambulatory Visit: Payer: 59

## 2018-12-14 MED FILL — POTASSIUM CHLORIDE CRYS ER: 20 | 30 days supply | Qty: 30 | Fill #0

## 2018-12-14 MED FILL — OMEPRAZOLE 20 MG CPDR: 20 | 30 days supply | Qty: 30 | Fill #0

## 2018-12-15 ENCOUNTER — Encounter: Payer: Self-pay | Admitting: Hematology

## 2018-12-15 ENCOUNTER — Ambulatory Visit (HOSPITAL_COMMUNITY)
Admission: RE | Admit: 2018-12-15 | Discharge: 2018-12-15 | Disposition: A | Discharge status: Home / Self Care | Payer: 59 | Source: Ambulatory Visit | Attending: Hematology | Admitting: Hematology

## 2018-12-15 ENCOUNTER — Inpatient Hospital Stay: Payer: 59

## 2018-12-15 ENCOUNTER — Inpatient Hospital Stay (HOSPITAL_BASED_OUTPATIENT_CLINIC_OR_DEPARTMENT_OTHER): Payer: 59 | Admitting: Hematology

## 2018-12-15 ENCOUNTER — Other Ambulatory Visit: Payer: Self-pay

## 2018-12-15 VITALS — BP 143/81 | HR 65 | Temp 98.9°F | Resp 17 | Ht 64.0 in | Wt 138.2 lb

## 2018-12-15 DIAGNOSIS — G620 Drug-induced polyneuropathy: Secondary | ICD-10-CM | POA: Diagnosis not present

## 2018-12-15 DIAGNOSIS — D6481 Anemia due to antineoplastic chemotherapy: Secondary | ICD-10-CM | POA: Diagnosis not present

## 2018-12-15 DIAGNOSIS — Z7189 Other specified counseling: Secondary | ICD-10-CM

## 2018-12-15 DIAGNOSIS — C50911 Malignant neoplasm of unspecified site of right female breast: Secondary | ICD-10-CM

## 2018-12-15 DIAGNOSIS — Z170 Estrogen receptor positive status [ER+]: Secondary | ICD-10-CM

## 2018-12-15 DIAGNOSIS — M7989 Other specified soft tissue disorders: Secondary | ICD-10-CM | POA: Diagnosis not present

## 2018-12-15 DIAGNOSIS — J069 Acute upper respiratory infection, unspecified: Secondary | ICD-10-CM | POA: Insufficient documentation

## 2018-12-15 DIAGNOSIS — Z7901 Long term (current) use of anticoagulants: Secondary | ICD-10-CM

## 2018-12-15 DIAGNOSIS — C787 Secondary malignant neoplasm of liver and intrahepatic bile duct: Secondary | ICD-10-CM

## 2018-12-15 DIAGNOSIS — R05 Cough: Secondary | ICD-10-CM | POA: Diagnosis not present

## 2018-12-15 DIAGNOSIS — T451X5A Adverse effect of antineoplastic and immunosuppressive drugs, initial encounter: Secondary | ICD-10-CM

## 2018-12-15 DIAGNOSIS — I10 Essential (primary) hypertension: Secondary | ICD-10-CM

## 2018-12-15 DIAGNOSIS — Z5112 Encounter for antineoplastic immunotherapy: Secondary | ICD-10-CM | POA: Diagnosis not present

## 2018-12-15 DIAGNOSIS — Z79811 Long term (current) use of aromatase inhibitors: Secondary | ICD-10-CM

## 2018-12-15 DIAGNOSIS — J329 Chronic sinusitis, unspecified: Secondary | ICD-10-CM | POA: Diagnosis not present

## 2018-12-15 DIAGNOSIS — R0981 Nasal congestion: Secondary | ICD-10-CM | POA: Diagnosis not present

## 2018-12-15 DIAGNOSIS — C50919 Malignant neoplasm of unspecified site of unspecified female breast: Secondary | ICD-10-CM

## 2018-12-15 DIAGNOSIS — Z79899 Other long term (current) drug therapy: Secondary | ICD-10-CM

## 2018-12-15 DIAGNOSIS — Z95828 Presence of other vascular implants and grafts: Secondary | ICD-10-CM

## 2018-12-15 DIAGNOSIS — K219 Gastro-esophageal reflux disease without esophagitis: Secondary | ICD-10-CM

## 2018-12-15 DIAGNOSIS — Z86718 Personal history of other venous thrombosis and embolism: Secondary | ICD-10-CM

## 2018-12-15 LAB — CBC WITH DIFFERENTIAL (CANCER CENTER ONLY)
Abs Immature Granulocytes: 0.01 10*3/uL (ref 0.00–0.07)
Basophils Absolute: 0.1 10*3/uL (ref 0.0–0.1)
Basophils Relative: 1 %
Eosinophils Absolute: 0.7 10*3/uL — ABNORMAL HIGH (ref 0.0–0.5)
Eosinophils Relative: 14 %
HCT: 32.2 % — ABNORMAL LOW (ref 36.0–46.0)
Hemoglobin: 10.1 g/dL — ABNORMAL LOW (ref 12.0–15.0)
Immature Granulocytes: 0 %
Lymphocytes Relative: 39 %
Lymphs Abs: 2.1 10*3/uL (ref 0.7–4.0)
MCH: 26.9 pg (ref 26.0–34.0)
MCHC: 31.4 g/dL (ref 30.0–36.0)
MCV: 85.6 fL (ref 80.0–100.0)
Monocytes Absolute: 0.3 10*3/uL (ref 0.1–1.0)
Monocytes Relative: 6 %
Neutro Abs: 2.2 10*3/uL (ref 1.7–7.7)
Neutrophils Relative %: 40 %
Platelet Count: 224 10*3/uL (ref 150–400)
RBC: 3.76 MIL/uL — ABNORMAL LOW (ref 3.87–5.11)
RDW: 16.1 % — ABNORMAL HIGH (ref 11.5–15.5)
WBC Count: 5.4 10*3/uL (ref 4.0–10.5)
nRBC: 0 % (ref 0.0–0.2)

## 2018-12-15 LAB — CMP (CANCER CENTER ONLY)
ALT: 16 U/L (ref 0–44)
AST: 24 U/L (ref 15–41)
Albumin: 3.6 g/dL (ref 3.5–5.0)
Alkaline Phosphatase: 106 U/L (ref 38–126)
Anion gap: 7 (ref 5–15)
BUN: 12 mg/dL (ref 8–23)
CO2: 27 mmol/L (ref 22–32)
Calcium: 9.4 mg/dL (ref 8.9–10.3)
Chloride: 105 mmol/L (ref 98–111)
Creatinine: 1.02 mg/dL — ABNORMAL HIGH (ref 0.44–1.00)
GFR, Est AFR Am: >60 mL/min (ref >60)
GFR, Estimated: 59 mL/min — ABNORMAL LOW (ref >60)
Glucose, Bld: 91 mg/dL (ref 70–99)
Potassium: 3.9 mmol/L (ref 3.5–5.1)
Sodium: 139 mmol/L (ref 135–145)
Total Bilirubin: 0.4 mg/dL (ref 0.3–1.2)
Total Protein: 7.1 g/dL (ref 6.5–8.1)

## 2018-12-15 MED ORDER — SODIUM CHLORIDE 0.9% FLUSH
10.0000 mL | INTRAVENOUS | Status: DC | PRN
  Administered 2018-12-15: 10 mL
  Filled 2018-12-15: qty 10

## 2018-12-15 MED ORDER — SODIUM CHLORIDE 0.9 % IV SOLN
Freq: Once | INTRAVENOUS | Status: AC
  Administered 2018-12-15: 15:00:00 via INTRAVENOUS
  Filled 2018-12-15: qty 250

## 2018-12-15 MED ORDER — ACETAMINOPHEN 325 MG PO TABS
ORAL_TABLET | ORAL | Status: AC
  Filled 2018-12-15: qty 2

## 2018-12-15 MED ORDER — ACETAMINOPHEN 325 MG PO TABS
650.0000 mg | ORAL_TABLET | Freq: Once | ORAL | Status: AC
  Administered 2018-12-15: 650 mg via ORAL

## 2018-12-15 MED ORDER — DIPHENHYDRAMINE HCL 25 MG PO CAPS
ORAL_CAPSULE | ORAL | Status: AC
  Filled 2018-12-15: qty 1

## 2018-12-15 MED ORDER — DIPHENHYDRAMINE HCL 50 MG/ML IJ SOLN
INTRAMUSCULAR | Status: AC
  Filled 2018-12-15: qty 1

## 2018-12-15 MED ORDER — TRASTUZUMAB CHEMO 150 MG IV SOLR
6.0000 mg/kg | Freq: Once | INTRAVENOUS | Status: AC
  Administered 2018-12-15: 399 mg via INTRAVENOUS
  Filled 2018-12-15: qty 19

## 2018-12-15 MED ORDER — AMOXICILLIN-POT CLAVULANATE 875-125 MG PO TABS: 1.0000 | ORAL_TABLET | Freq: Two times a day (BID) | ORAL | 0 refills | Status: DC

## 2018-12-15 MED ORDER — FUROSEMIDE 20 MG PO TABS: 20.0000 mg | ORAL_TABLET | Freq: Every day | ORAL | 0 refills | Status: DC

## 2018-12-15 MED ORDER — DIPHENHYDRAMINE HCL 50 MG/ML IJ SOLN
25.0000 mg | Freq: Once | INTRAMUSCULAR | Status: AC
  Administered 2018-12-15: 25 mg via INTRAVENOUS

## 2018-12-15 MED ORDER — HEPARIN SOD (PORK) LOCK FLUSH 100 UNIT/ML IV SOLN
500.0000 [IU] | Freq: Once | INTRAVENOUS | Status: AC | PRN
  Administered 2018-12-15: 500 [IU]
  Filled 2018-12-15: qty 5

## 2018-12-15 MED ORDER — SODIUM CHLORIDE 0.9 % IV SOLN
420.0000 mg | Freq: Once | INTRAVENOUS | Status: AC
  Administered 2018-12-15: 420 mg via INTRAVENOUS
  Filled 2018-12-15: qty 14

## 2018-12-15 MED FILL — AMOX-CLAV 875-125 MG TABLET: 875-125 | 7 days supply | Qty: 14 | Fill #0

## 2018-12-15 MED FILL — FUROSEMIDE 20 MG TABS: 20 | 30 days supply | Qty: 30 | Fill #0

## 2018-12-15 NOTE — Patient Instructions (Signed)
[facility] Discharge Instructions for Patients Receiving Chemotherapy  Today you received the following chemotherapy agents Trastuzumab (HERCEPTIN) & Pertuzumab (PERJETA).  To help prevent nausea and vomiting after your treatment, we encourage you to take your nausea medication as prescribed.   If you develop nausea and vomiting that is not controlled by your nausea medication, call the clinic.   BELOW ARE SYMPTOMS THAT SHOULD BE REPORTED IMMEDIATELY:  *FEVER GREATER THAN 100.5 F  *CHILLS WITH OR WITHOUT FEVER  NAUSEA AND VOMITING THAT IS NOT CONTROLLED WITH YOUR NAUSEA MEDICATION  *UNUSUAL SHORTNESS OF BREATH  *UNUSUAL BRUISING OR BLEEDING  TENDERNESS IN MOUTH AND THROAT WITH OR WITHOUT PRESENCE OF ULCERS  *URINARY PROBLEMS  *BOWEL PROBLEMS  UNUSUAL RASH Items with * indicate a potential emergency and should be followed up as soon as possible.  Feel free to call the clinic should you have any questions or concerns. The clinic phone number is [phone].  Please show the [facility] at check-in to the Emergency Department and triage nurse.  Coronavirus (COVID-19) Are you at risk?  Are you at risk for the Coronavirus (COVID-19)?  To be considered HIGH RISK for Coronavirus (COVID-19), you have to meet the following criteria:  . Traveled to [place], [place], [place], [place] or [place]; or in the [place] to [place], [place], [place], or [place]; and have fever, cough, and shortness of breath within the last 2 weeks of travel OR . Been in close contact with a person diagnosed with COVID-19 within the last 2 weeks and have fever, cough, and shortness of breath . IF YOU DO NOT MEET THESE CRITERIA, YOU ARE CONSIDERED LOW RISK FOR COVID-19.  What to do if you are HIGH RISK for COVID-19?  Marland Kitchen If you are having a medical emergency, call 911. . Seek medical care right away. Before you go to a doctor's office, urgent care or emergency department,  call ahead and tell them about your recent travel, contact with someone diagnosed with COVID-19, and your symptoms. You should receive instructions from your physician's office regarding next steps of care.  . When you arrive at healthcare provider, tell the healthcare staff immediately you have returned from visiting [place], [place], [place], [place] or [place]; or traveled in the [place] to [place], [place], [place], or [place]; in the last two weeks or you have been in close contact with a person diagnosed with COVID-19 in the last 2 weeks.   . Tell the health care staff about your symptoms: fever, cough and shortness of breath. . After you have been seen by a medical provider, you will be either: o Tested for (COVID-19) and discharged home on quarantine except to seek medical care if symptoms worsen, and asked to  - Stay home and avoid contact with others until you get your results (4-5 days)  - Avoid travel on public transportation if possible (such as bus, train, or airplane) or o Sent to the Emergency Department by EMS for evaluation, COVID-19 testing, and possible admission depending on your condition and test results.  What to do if you are LOW RISK for COVID-19?  Reduce your risk of any infection by using the same precautions used for avoiding the common cold or flu:  Marland Kitchen Wash your hands often with soap and warm water for at least 20 seconds.  If soap and water are not readily available, use an alcohol-based hand sanitizer with at least 60% alcohol.  Marland Kitchen  If coughing or sneezing, cover your mouth and nose by coughing or sneezing into the elbow areas of your shirt or coat, into a tissue or into your sleeve (not your hands). . Avoid shaking hands with others and consider head nods or verbal greetings only. . Avoid touching your eyes, nose, or mouth with unwashed hands.  . Avoid close contact with people who are sick. . Avoid places or events with large numbers of people in one  location, like concerts or sporting events. . Carefully consider travel plans you have or are making. . If you are planning any travel outside or inside the [place], visit the CDC's Travelers' Health webpage for the latest health notices. . If you have some symptoms but not all symptoms, continue to monitor at home and seek medical attention if your symptoms worsen. . If you are having a medical emergency, call 911.   ADDITIONAL HEALTHCARE OPTIONS FOR PATIENTS  [facility] / e-Visit: [email or web address]         [facility]: [phone]  [facility]: [phone]                   [facility]: [phone]   

## 2018-12-16 LAB — CANCER ANTIGEN 27.29: CA 27.29: 58.6 U/mL — ABNORMAL HIGH (ref 0.0–38.6)

## 2018-12-18 ENCOUNTER — Telehealth: Payer: Self-pay | Admitting: Hematology

## 2018-12-18 NOTE — Telephone Encounter (Signed)
Scheduled appt per 5/29 los. °

## 2018-12-25 ENCOUNTER — Other Ambulatory Visit: Payer: Self-pay | Admitting: *Deleted

## 2018-12-25 DIAGNOSIS — Z20822 Contact with and (suspected) exposure to covid-19: Secondary | ICD-10-CM

## 2018-12-25 MED FILL — GABAPENTIN 300 MG CAPSULE: 300 | 30 days supply | Qty: 120 | Fill #1

## 2018-12-25 MED FILL — XARELTO 20 MG TABLET: 20 | 30 days supply | Qty: 30 | Fill #1

## 2018-12-26 ENCOUNTER — Telehealth: Payer: Self-pay | Admitting: *Deleted

## 2018-12-26 NOTE — Telephone Encounter (Signed)
Received call from pt stating that she finished amoxicillin script on Sun.  She took 7 days.  She states she is still coughing & feels congested in her chest.  She feels like chest is rattling.  She is only bringing up a small amt & is hard to get up.  What she does get up is mostly clear.  She took robitussin DM yest but has not taken any more.  She states she is drinking as much fluids as she can.  Encouraged increase of fluids & suggested taking the Robitussin DM more frequently or adding Mucinex to see if that helps.  Informed that I would discuss with practitioner.  Message to Cira Rue NP.

## 2018-12-26 NOTE — Telephone Encounter (Signed)
Yes, I agree with your advice. Please have her call us back if she develops fever, chills, worsening cough. Appears she has a pending covid test. I'll have a nurse check on her in a couple days.  Thanks, Regan Rakers NP

## 2018-12-27 ENCOUNTER — Ambulatory Visit (HOSPITAL_COMMUNITY)
Admission: RE | Admit: 2018-12-27 | Discharge: 2018-12-27 | Disposition: A | Discharge status: Home / Self Care | Payer: 59 | Source: Ambulatory Visit | Attending: Internal Medicine | Admitting: Internal Medicine

## 2018-12-27 ENCOUNTER — Telehealth: Payer: Self-pay

## 2018-12-27 ENCOUNTER — Ambulatory Visit (HOSPITAL_BASED_OUTPATIENT_CLINIC_OR_DEPARTMENT_OTHER)
Admission: RE | Admit: 2018-12-27 | Discharge: 2018-12-27 | Disposition: A | Discharge status: Home / Self Care | Payer: 59 | Source: Ambulatory Visit | Attending: Internal Medicine | Admitting: Internal Medicine

## 2018-12-27 ENCOUNTER — Other Ambulatory Visit: Payer: Self-pay

## 2018-12-27 DIAGNOSIS — C50919 Malignant neoplasm of unspecified site of unspecified female breast: Secondary | ICD-10-CM | POA: Diagnosis not present

## 2018-12-27 DIAGNOSIS — I071 Rheumatic tricuspid insufficiency: Secondary | ICD-10-CM | POA: Insufficient documentation

## 2018-12-27 DIAGNOSIS — R6889 Other general symptoms and signs: Secondary | ICD-10-CM | POA: Diagnosis not present

## 2018-12-27 NOTE — Progress Notes (Signed)
Heart Failure TeleHealth Note  Due to national recommendations of social distancing due to [place] 19, Audio/video telehealth visit is felt to be most appropriate for this patient at this time.  See MyChart message from today for patient consent regarding telehealth for [facility].  Date:  12/27/2018   ID:  Teresa Hays, DOB [birth year], MRN [id number]  Location: Home  Provider location: [facility] Type of Visit: New patient  PCP:  Levin Bacon, NP  Cardiologist:  No primary care provider on file.  Referring: Dr. Burr Medico  Chief Complaint: Cardio-Oncology Consultation    History of Present Illness:  Teresa Hays is a 63 y/o woman with GERD, HTN and metastatic breast CA referred b Dr. Burr Medico for enrollment into the Cardio-Oncology program.   SUMMARY OF ONCOLOGIC HISTORY:     Oncology History   Cancer Staging Metastatic breast cancer [facility]) Staging form: Breast, AJCC 8th Edition - Clinical stage from 03/24/2018: Stage IV (cT2, cN1, pM1, G3, ER+, PR+, HER2+) - Signed by [name], MD on 03/30/2018       Metastatic breast cancer ([place])   01/30/2018 Procedure    Colonoscopy showed small polyp in the sigmoid colon, removed, the exam of colon including the terminal ileum was otherwise negative.    01/30/2018 Procedure    EGD by Dr. Collene Mares showed small hiatal hernia, a 8 mm polypoid lesion in the cardia, biopsied.    03/09/2018 Imaging    03/09/2018 US Abdomen IMPRESSION: 1. Mass lesions throughout the liver, consistent with metastatic disease. Liver as a somewhat nodular contour suggesting underlying hepatic cirrhosis. Inhomogeneous echotexture to the liver.  2. Cholelithiasis with mild gallbladder wall thickening. A degree of cholecystitis cannot be excluded by ultrasound.  3. Portions of pancreas obscured by gas. Visualized portions of pancreas appear normal.  4. Small right kidney. Etiology uncertain. This finding  potentially may be indicative of renal artery stenosis. In this regard, question whether patient is hypertensive.    03/15/2018 Imaging    CT CAP with contrast  IMPRESSION: 1. Widespread hepatic metastasis. 2. 2.6 cm lateral right breast soft tissue nodule could represent a breast primary or an incidental benign lesion. Consider correlation with mammogram and ultrasound. 3. No definite source of primary malignancy identified within the abdomen or pelvis. There is possible rectosigmoid junction wall thickening. Consider colonoscopy with attention to this area. 4. Distal esophageal wall thickening, suggesting esophagitis.    03/24/2018 Cancer Staging    Staging form: Breast, AJCC 8th Edition - Clinical stage from 03/24/2018: Stage IV (cT2, cN1, pM1, G3, ER+, PR+, HER2+) - Signed by [name], MD on 03/30/2018    03/24/2018 Initial Biopsy    Diagnosis 1. 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 Breast, right, needle core biopsy, 9 o'clock, 7cm from nipple - INVASIVE DUCTAL CARCINOMA. -The carcinoma is somewhat morphologically dissimilar from that in part 1. It appears grade III 3. Lymph node, needle/core biopsy, right axillary - METASTATIC CARCINOMA IN 1 OF 1 LYMPH NODE (1/1).    03/24/2018 Receptors her2    Breast biopsy: 1. Estrogen Receptor: 40%, POSITIVE, STRONG-MODERATE STAINING INTENSITY Progesterone Receptor: 70%, POSITIVE, STRONG STAINING INTENSITY Proliferation Marker Ki67: 20% HER 2 equivocal by IHC 2+, POSITIVE by FISH, ratio 2.4 and copy #4.2  2. Estrogen Receptor: 60%, POSITIVE, MODERATE STAINING INTENSITY Progesterone Receptor: 40%, POSITIVE, MODERATE STAINING INTENSITY Proliferation Marker Ki67: 20% HER2 (+) by  IHC 3+    03/24/2018 Initial Diagnosis    Metastatic breast cancer ([place])    03/27/2018 Pathology Results    Diagnosis Liver, needle/core biopsy,  Right - METASTATIC CARCINOMA TO LIVER, CONSISTENT WITH PATIENTS CLINICAL HISTORY OF PRIMARY BREAST CARCINOMA.  ER 80%+ PR40%+ HER2- (by [facility], IHC 2+)  Ki67 50%     03/28/2018 Pathology Results    03/28/2018 Surgical Pathology Diagnosis 1. Breast, left, needle core biopsy, 9 o'clock - FIBROCYSTIC CHANGES. - THERE IS NO EVIDENCE OF MALIGNANCY. 2. Breast, left, needle core biopsy, 2 o'clock - FIBROADENOMA. - THERE IS NO EVIDENCE OF MALIGNANCY. - SEE COMMENT.    03/29/2018 Imaging    03/29/2018 Bone Scan IMPRESSION: No scintigraphic evidence of osseous metastatic disease.    03/30/2018 Imaging    Bone scan  IMPRESSION: No scintigraphic evidence of osseous metastatic disease.     04/07/2018 -  Chemotherapy    First line chemo weekly Taxol and herceptin/Perjeta every 3 weeks starting 04/07/18. She developed infusion reaction to taxol and it was discontinued. Added Abraxane on C1D8, 2 weeks on/1 week off.  Abraxne stopped after 10/13/18 due to worsening Neuropathy    06/06/2018 Imaging    CT CAP IMPRESSION: 1. Generally improved appearance, with reduced axillary adenopathy and reduced enhancing component of the hepatic masses, with some of the hepatic mass is moderately smaller than on the prior exam. Reduced size of the right lateral breast mass compared to the prior 03/15/2018 exam. 2. New mild interstitial accentuation in the lungs, significance uncertain. Part of this appearance may be due to lower lung volumes on today's exam. 3. Mild wall thickening in the descending colon and upper rectum suggesting low-grade colitis/inflammation. Prominent stool throughout the colon favors constipation. 4. Other imaging findings of potential clinical significance: Aortic Atherosclerosis (ICD10-I70.0). Mild cardiomegaly. Mild nodularity in the right lower lobe appears stable. Contracted and thick-walled gallbladder.     09/18/2018 Imaging    CT CAP W Contrast 09/18/18   IMPRESSION: 1. Liver metastases have decreased in size. 2. Right breast mass, mild right axillary lymphadenopathy and scattered tiny right pulmonary nodules are all stable. 3. New mild left supraclavicular and left subpectoral lymphadenopathy, can not exclude progression of metastatic nodal disease. 4. Moderate colorectal stool volume, which may indicate constipation. 5. Aortic Atherosclerosis (ICD10-I70.0).    10/2018 -  Anti-estrogen oral therapy    Letrozole 2.5 mg daily starting 10/2018     She presents via audio/video conferencing for a telehealth visit today due to [place] pandemic. She denies any known h/o heart disease. She has underwent first-line weekly TaxolwithHerceptin/Perjeta every 3 weeks starting 04/07/18. She developed infusion reaction to taxol and it was discontinued. Changed to Abraxaneweekly2 weeks on/1 week offfrom 07/21/2018.Abraxane held after October 13, 2018 due to his worsening neuropathy. Now on Letrozole 2.5 mg daily starting 10/2018. Not as active as she was before. Back to doing her ADLs. 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 Taking gabapentin. No CP. Occasional R ankle edema.   Echo 07/28/18 EF 60-65% GLS -19.2% Echo 12/1018  EF 55-60% GLS -16.1%    Teresa Hays denies symptoms worrisome for COVID 19.   Past Medical History:  Diagnosis Date   GERD (gastroesophageal reflux disease)    Hypertension    rt breast ca with mets to liver dx'd 03/2018   Past Surgical History:  Procedure Laterality Date   COLONOSCOPY     ESOPHAGOGASTRODUODENOSCOPY ENDOSCOPY     IR IMAGING GUIDED PORT INSERTION  04/04/2018  Current Outpatient Medications  Medication Sig Dispense Refill   amLODipine (NORVASC) 5 MG tablet Take 5 mg by mouth daily.      amoxicillin-clavulanate (AUGMENTIN) 875-125 MG tablet Take 1 tablet by mouth 2 (two) times daily. 14 tablet 0   atenolol (TENORMIN) 50 MG tablet Take 50 mg by mouth daily.       diphenoxylate-atropine (LOMOTIL) 2.5-0.025 MG tablet Take 2 tablets by mouth 4 (four) times daily as needed for diarrhea or loose stools. 30 tablet 0   furosemide (LASIX) 20 MG tablet Take 1 tablet (20 mg total) by mouth daily. 30 tablet 0   gabapentin (NEURONTIN) 300 MG capsule Take 1 capsule (300 mg total) by mouth 3 (three) times daily. (Patient taking differently: Take 300 mg by mouth 3 (three) times daily. Takes 1 q am and 1 pm and 2 q hs) 120 capsule 1   letrozole (FEMARA) 2.5 MG tablet Take 1 tablet (2.5 mg total) by mouth daily. 30 tablet 3   lidocaine-prilocaine (EMLA) cream Apply to affected area once 30 g 3   LORazepam (ATIVAN) 0.5 MG tablet Take 1 tablet (0.5 mg total) by mouth at bedtime as needed for anxiety. 30 tablet 0   omeprazole (PRILOSEC) 20 MG capsule Take 1 capsule (20 mg total) by mouth daily. 30 capsule 3   ondansetron (ZOFRAN) 8 MG tablet Take 1 tablet (8 mg total) by mouth 2 (two) times daily as needed (Nausea or vomiting). 30 tablet 1   potassium chloride SA (K-DUR) 20 MEQ tablet Take 1 tablet by mouth once daily 30 tablet 0   rivaroxaban (XARELTO) 20 MG TABS tablet Take 1 tablet (20 mg total) by mouth daily with supper. 30 tablet 5   traMADol (ULTRAM) 50 MG tablet Take 1 tablet (50 mg total) by mouth every 6 (six) hours as needed for moderate pain or severe pain. 30 tablet 0   No current facility-administered medications for this encounter.     Allergies:   Patient has no known allergies.   Social History:  The patient  reports that she has never smoked. She has never used smokeless tobacco. She reports that she does not drink alcohol or use drugs.   Family History:  The patient's family history includes Cancer (age of onset: 19) in her sister; Diabetes in her mother; Rheum arthritis in her sister.   ROS:  Please see the history of present illness.   All other systems are personally reviewed and negative.   Exam:  (Video/Tele Health Call; Exam is  subjective and or/visual.) General:  Speaks in full sentences. No resp difficulty. Lungs: Normal respiratory effort with conversation.  Abdomen: Non-distended per patient report Extremities: Pt denies edema. Neuro: Alert & oriented x 3.   Recent Labs: 12/15/2018: ALT 16; BUN 12; Creatinine 1.02; Hemoglobin 10.1; Platelet Count 224; Potassium 3.9; Sodium 139  Personally reviewed   Wt Readings from Last 3 Encounters:  12/15/18 62.7 kg (138 lb 3.2 oz)  11/24/18 62.8 kg (138 lb 8 oz)  11/02/18 64 kg (141 lb 1.6 oz)      ASSESSMENT AND PLAN:  1. Breast cancer, metastatic - She has underwent first-line weekly TaxolwithHerceptin/Perjeta every 3 weeks starting 04/07/18. She developed infusion reaction to taxol and it was discontinued. Changed to Abraxaneweekly2 weeks on/1 week offfrom 07/21/2018.Abraxane held after October 13, 2018 due to his worsening neuropathy. Now on Letrozole 2.5 mg daily starting 10/2018 - Explained incidence of Herceptin cardiotoxicity and role of Cardio-oncology clinic at length.  - Echo images  reviewed personally. EF perhaps slightly down but not convincingly so. Will continue current therapy and repeat echo in 2 months.    COVID screen The patient does not have any symptoms that suggest any further testing/ screening at this time.  Social distancing reinforced today.  Recommended follow-up:  As above  Relevant cardiac medications were reviewed at length with the patient today.   The patient does not have concerns regarding their medications at this time.   The following changes were made today:  As above  Today, I have spent 21 minutes with the patient with telehealth technology discussing the above issues .    Signed, Glori Bickers, MD  12/27/2018 12:30 PM  [facility] [place] [address] [facility] [place] [address] [phone] (office) [phone] (fax)

## 2018-12-27 NOTE — Telephone Encounter (Signed)
Pt states that she still has cough and congestion but no fever. Pt states that she has finished the amoxicillin and would like to take Delsym instead of Robitussin. Advised pt per Lillia Abed is ok to take and symptoms get worse of she has fever to call the office. Pt verbalized understanding

## 2018-12-27 NOTE — Progress Notes (Signed)
  Echocardiogram 2D Echocardiogram has been performed.  Oneal Deputy Alonni Heimsoth 12/27/2018, 8:54 AM

## 2018-12-28 ENCOUNTER — Encounter (HOSPITAL_COMMUNITY): Payer: Self-pay | Admitting: *Deleted

## 2018-12-28 NOTE — Progress Notes (Signed)
Bensimhon, Shaune Pascal, MD  You 17 hours ago (4:10 PM)   1) echo in 2 months with visit (ok to do tele)    Order placed per Dr Haroldine Laws.  Message sent to schedulers to schedule, AVS sent via mychart.

## 2018-12-28 NOTE — Patient Instructions (Signed)
Your physician recommends that you schedule a follow-up appointment in: 2 months with echocardiogram.  Our office will call you to schedule this.  If you have any questions or concerns before your next appointment please send [place] a message through [place] or call our office at [phone].

## 2018-12-28 NOTE — Addendum Note (Signed)
Encounter addended by: Scarlette Calico, RN on: 12/28/2018 9:44 AM  Actions taken: Order list changed, Diagnosis association updated, Clinical Note Signed

## 2018-12-29 LAB — NOVEL CORONAVIRUS, NAA: SARS-CoV-2, NAA: NOT DETECTED

## 2019-01-01 ENCOUNTER — Telehealth: Payer: Self-pay

## 2019-01-01 ENCOUNTER — Telehealth: Payer: Self-pay | Admitting: Nurse Practitioner

## 2019-01-01 MED ORDER — HYDROCOD POLST-CPM POLST ER 10-8 MG/5ML PO SUER: 5.0000 mL | Freq: Every evening | ORAL | 0 refills | Status: DC | PRN

## 2019-01-01 NOTE — Telephone Encounter (Signed)
I called Ms. Denslow to f/u on her cough. She continues to have nasal and chest congestion with productive cough. Sputum is clear. Cough has been going on about 2 months now. Did not improve with augmentin or supportive OTC meds. Recent chest Xray was clear. Denies fever or chills. She had a recent covid19 test which was negative. I recommend to come in for an office visit tomorrow to further evaluate her, she cannot come until Friday for her routine f/u with Dr. Burr Medico on 6/19. I suggest she may call PCP. I will call her in cough suppressant in the meantime to [facility] outpatient pharmacy. She knows to call if she develops worsening cough, fever, or chills. Cira Rue, NP  01/01/19

## 2019-01-01 NOTE — Telephone Encounter (Signed)
Patient calls stating she is still congested, still coughing, denies fever.  She wants advice on what she should do.  [phone]   Message given to Cira Rue NP for review.

## 2019-01-02 ENCOUNTER — Other Ambulatory Visit: Payer: Self-pay

## 2019-01-02 ENCOUNTER — Inpatient Hospital Stay: Payer: 59 | Attending: Nurse Practitioner | Admitting: Nurse Practitioner

## 2019-01-02 ENCOUNTER — Inpatient Hospital Stay: Payer: 59

## 2019-01-02 ENCOUNTER — Encounter: Payer: Self-pay | Admitting: Nurse Practitioner

## 2019-01-02 VITALS — BP 149/74 | HR 60 | Temp 98.6°F | Resp 17 | Ht 64.0 in | Wt 139.5 lb

## 2019-01-02 DIAGNOSIS — F419 Anxiety disorder, unspecified: Secondary | ICD-10-CM | POA: Diagnosis not present

## 2019-01-02 DIAGNOSIS — D6481 Anemia due to antineoplastic chemotherapy: Secondary | ICD-10-CM

## 2019-01-02 DIAGNOSIS — Z86718 Personal history of other venous thrombosis and embolism: Secondary | ICD-10-CM | POA: Insufficient documentation

## 2019-01-02 DIAGNOSIS — I10 Essential (primary) hypertension: Secondary | ICD-10-CM | POA: Diagnosis not present

## 2019-01-02 DIAGNOSIS — Z95828 Presence of other vascular implants and grafts: Secondary | ICD-10-CM

## 2019-01-02 DIAGNOSIS — K219 Gastro-esophageal reflux disease without esophagitis: Secondary | ICD-10-CM | POA: Insufficient documentation

## 2019-01-02 DIAGNOSIS — T451X5A Adverse effect of antineoplastic and immunosuppressive drugs, initial encounter: Secondary | ICD-10-CM

## 2019-01-02 DIAGNOSIS — C787 Secondary malignant neoplasm of liver and intrahepatic bile duct: Secondary | ICD-10-CM | POA: Diagnosis not present

## 2019-01-02 DIAGNOSIS — G620 Drug-induced polyneuropathy: Secondary | ICD-10-CM | POA: Diagnosis not present

## 2019-01-02 DIAGNOSIS — Z170 Estrogen receptor positive status [ER+]: Secondary | ICD-10-CM | POA: Diagnosis not present

## 2019-01-02 DIAGNOSIS — C50911 Malignant neoplasm of unspecified site of right female breast: Secondary | ICD-10-CM | POA: Diagnosis not present

## 2019-01-02 DIAGNOSIS — J069 Acute upper respiratory infection, unspecified: Secondary | ICD-10-CM | POA: Diagnosis not present

## 2019-01-02 DIAGNOSIS — Z79811 Long term (current) use of aromatase inhibitors: Secondary | ICD-10-CM | POA: Insufficient documentation

## 2019-01-02 DIAGNOSIS — Z7951 Long term (current) use of inhaled steroids: Secondary | ICD-10-CM | POA: Diagnosis not present

## 2019-01-02 DIAGNOSIS — Z5112 Encounter for antineoplastic immunotherapy: Secondary | ICD-10-CM | POA: Insufficient documentation

## 2019-01-02 DIAGNOSIS — Z7901 Long term (current) use of anticoagulants: Secondary | ICD-10-CM | POA: Diagnosis not present

## 2019-01-02 DIAGNOSIS — Z79899 Other long term (current) drug therapy: Secondary | ICD-10-CM | POA: Insufficient documentation

## 2019-01-02 LAB — CBC WITH DIFFERENTIAL (CANCER CENTER ONLY)
Abs Immature Granulocytes: 0.01 10*3/uL (ref 0.00–0.07)
Basophils Absolute: 0 10*3/uL (ref 0.0–0.1)
Basophils Relative: 1 %
Eosinophils Absolute: 0.8 10*3/uL — ABNORMAL HIGH (ref 0.0–0.5)
Eosinophils Relative: 14 %
HCT: 33.5 % — ABNORMAL LOW (ref 36.0–46.0)
Hemoglobin: 10.8 g/dL — ABNORMAL LOW (ref 12.0–15.0)
Immature Granulocytes: 0 %
Lymphocytes Relative: 38 %
Lymphs Abs: 2.4 10*3/uL (ref 0.7–4.0)
MCH: 27.5 pg (ref 26.0–34.0)
MCHC: 32.2 g/dL (ref 30.0–36.0)
MCV: 85.2 fL (ref 80.0–100.0)
Monocytes Absolute: 0.3 10*3/uL (ref 0.1–1.0)
Monocytes Relative: 4 %
Neutro Abs: 2.7 10*3/uL (ref 1.7–7.7)
Neutrophils Relative %: 43 %
Platelet Count: 191 10*3/uL (ref 150–400)
RBC: 3.93 MIL/uL (ref 3.87–5.11)
RDW: 15.9 % — ABNORMAL HIGH (ref 11.5–15.5)
WBC Count: 6.2 10*3/uL (ref 4.0–10.5)
nRBC: 0 % (ref 0.0–0.2)

## 2019-01-02 MED ORDER — LEVOFLOXACIN 500 MG PO TABS: 500.0000 mg | ORAL_TABLET | Freq: Every day | ORAL | 0 refills | Status: DC

## 2019-01-02 MED ORDER — SODIUM CHLORIDE 0.9% FLUSH
10.0000 mL | INTRAVENOUS | Status: DC | PRN
  Administered 2019-01-02: 10 mL
  Filled 2019-01-02: qty 10

## 2019-01-02 MED ORDER — HEPARIN SOD (PORK) LOCK FLUSH 100 UNIT/ML IV SOLN
500.0000 [IU] | Freq: Once | INTRAVENOUS | Status: AC | PRN
  Administered 2019-01-02: 500 [IU]
  Filled 2019-01-02: qty 5

## 2019-01-02 MED ORDER — FLUTICASONE PROPIONATE 50 MCG/ACT NA SUSP: 2.0000 | Freq: Two times a day (BID) | NASAL | 0 refills | Status: DC | PRN

## 2019-01-02 MED FILL — FLUTICASONE PROP 50 MCG SPR: 50 | 30 days supply | Qty: 16 | Fill #0

## 2019-01-02 MED FILL — levoFLOXacin 500 MG TABS: 500 | 5 days supply | Qty: 5 | Fill #0

## 2019-01-02 MED FILL — HYDROCODONE-CHLORPHEN ER SU: 10-8 | 23 days supply | Qty: 115 | Fill #0

## 2019-01-02 NOTE — Progress Notes (Signed)
[place]   [phone] [phone]   Clinic Follow up Note   Patient Care Team: Levin Bacon, NP as PCP - General (Family Medicine) [name], MD as Consulting Physician (Gastroenterology) [name], MD as Consulting Physician (Hematology) 01/02/2019  CHIEF COMPLAINT:  Nasal congestion, cough    SUMMARY OF ONCOLOGIC HISTORY: Oncology History Overview Note  Cancer Staging Metastatic breast cancer [facility]) Staging form: Breast, AJCC 8th Edition - Clinical stage from 03/24/2018: Stage IV (cT2, cN1, pM1, G3, ER+, PR+, HER2+) - Signed by [name], MD on 03/30/2018     Metastatic breast cancer ([place])  01/30/2018 Procedure   Colonoscopy showed small polyp in the sigmoid colon, removed, the exam of colon including the terminal ileum was otherwise negative.   01/30/2018 Procedure   EGD by Dr. Collene Mares showed small hiatal hernia, a 8 mm polypoid lesion in the cardia, biopsied.   03/09/2018 Imaging   03/09/2018 US Abdomen IMPRESSION: 1. Mass lesions throughout the liver, consistent with metastatic disease. Liver as a somewhat nodular contour suggesting underlying hepatic cirrhosis. Inhomogeneous echotexture to the liver.  2. Cholelithiasis with mild gallbladder wall thickening. A degree of cholecystitis cannot be excluded by ultrasound.  3. Portions of pancreas obscured by gas. Visualized portions of pancreas appear normal.  4. Small right kidney. Etiology uncertain. This finding potentially may be indicative of renal artery stenosis. In this regard, question whether patient is hypertensive.   03/15/2018 Imaging   CT CAP with contrast  IMPRESSION: 1. Widespread hepatic metastasis. 2. 2.6 cm lateral right breast soft tissue nodule could represent a breast primary or an incidental benign lesion. Consider correlation with mammogram and ultrasound. 3. No definite source of primary malignancy identified within the abdomen or pelvis. There is possible  rectosigmoid junction wall thickening. Consider colonoscopy with attention to this area. 4. Distal esophageal wall thickening, suggesting esophagitis.   03/24/2018 Cancer Staging   Staging form: Breast, AJCC 8th Edition - Clinical stage from 03/24/2018: Stage IV (cT2, cN1, pM1, G3, ER+, PR+, HER2+) - Signed by [name], MD on 03/30/2018   03/24/2018 Initial Biopsy   Diagnosis 1. 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 Breast, right, needle core biopsy, 9 o'clock, 7cm from nipple - INVASIVE DUCTAL CARCINOMA. -The carcinoma is somewhat morphologically dissimilar from that in part 1. It appears grade III 3. Lymph node, needle/core biopsy, right axillary - METASTATIC CARCINOMA IN 1 OF 1 LYMPH NODE (1/1).   03/24/2018 Receptors her2   Breast biopsy: 1. Estrogen Receptor: 40%, POSITIVE, STRONG-MODERATE STAINING INTENSITY Progesterone Receptor: 70%, POSITIVE, STRONG STAINING INTENSITY Proliferation Marker Ki67: 20% HER 2 equivocal by IHC 2+, POSITIVE by FISH, ratio 2.4 and copy #4.2  2. Estrogen Receptor: 60%, POSITIVE, MODERATE STAINING INTENSITY Progesterone Receptor: 40%, POSITIVE, MODERATE STAINING INTENSITY Proliferation Marker Ki67: 20% HER2 (+) by IHC 3+   03/24/2018 Initial Diagnosis   Metastatic breast cancer ([place])   03/27/2018 Pathology Results   Diagnosis Liver, needle/core biopsy, Right - METASTATIC CARCINOMA TO LIVER, CONSISTENT WITH PATIENTS CLINICAL HISTORY OF PRIMARY BREAST CARCINOMA.  ER 80%+ PR40%+ HER2- (by [facility], IHC 2+)  Ki67 50%    03/28/2018 Pathology Results   03/28/2018 Surgical Pathology Diagnosis 1. Breast, left, needle core biopsy, 9 o'clock - FIBROCYSTIC CHANGES. - THERE IS NO EVIDENCE OF MALIGNANCY. 2. Breast, left, needle core biopsy, 2 o'clock - FIBROADENOMA. - THERE IS NO EVIDENCE OF MALIGNANCY. - SEE COMMENT.  03/29/2018 Imaging   03/29/2018 Bone Scan  IMPRESSION: No scintigraphic evidence of osseous metastatic disease.   03/30/2018 Imaging   Bone scan  IMPRESSION: No scintigraphic evidence of osseous metastatic disease.    04/07/2018 -  Chemotherapy   First line chemo weekly Taxol and herceptin/Perjeta every 3 weeks starting 04/07/18. She developed infusion reaction to taxol and it was discontinued. Added Abraxane on C1D8, 2 weeks on/1 week off.  Abraxne stopped after 10/13/18 due to worsening Neuropathy   06/06/2018 Imaging   CT CAP IMPRESSION: 1. Generally improved appearance, with reduced axillary adenopathy and reduced enhancing component of the hepatic masses, with some of the hepatic mass is moderately smaller than on the prior exam. Reduced size of the right lateral breast mass compared to the prior 03/15/2018 exam. 2. New mild interstitial accentuation in the lungs, significance uncertain. Part of this appearance may be due to lower lung volumes on today's exam. 3. Mild wall thickening in the descending colon and upper rectum suggesting low-grade colitis/inflammation. Prominent stool throughout the colon favors constipation. 4. Other imaging findings of potential clinical significance: Aortic Atherosclerosis (ICD10-I70.0). Mild cardiomegaly. Mild nodularity in the right lower lobe appears stable. Contracted and thick-walled gallbladder.    09/18/2018 Imaging   CT CAP W Contrast 09/18/18  IMPRESSION: 1. Liver metastases have decreased in size. 2. Right breast mass, mild right axillary lymphadenopathy and scattered tiny right pulmonary nodules are all stable. 3. New mild left supraclavicular and left subpectoral lymphadenopathy, can not exclude progression of metastatic nodal disease. 4. Moderate colorectal stool volume, which may indicate constipation. 5.  Aortic Atherosclerosis (ICD10-I70.0).   10/2018 -  Anti-estrogen oral therapy   Letrozole 2.5 mg daily starting 10/2018     INTERVAL HISTORY: Ms. Furber presents for  unscheduled visit for persistent cough and nasal congestion going on 2 months. Symptoms began soon after starting letrozole. She was prescribed augmentin recently which she completed over 1 week ago. Also has tried OTC symptom management with robitussin, delsym, claritin. Nothing helps. Recently her right ear became "clogged." Nasal and respiratory sputum is clear. No bloody drainage. Denies fever, chills, sore throat, dyspnea, chest pain. She is eating and drinking well.   MEDICAL HISTORY:  Past Medical History:  Diagnosis Date  . GERD (gastroesophageal reflux disease)   . Hypertension   . rt breast ca with mets to liver dx'd 03/2018    SURGICAL HISTORY: Past Surgical History:  Procedure Laterality Date  . COLONOSCOPY    . ESOPHAGOGASTRODUODENOSCOPY ENDOSCOPY    . IR IMAGING GUIDED PORT INSERTION  04/04/2018    I have reviewed the social history and family history with the patient and they are unchanged from previous note.  ALLERGIES:  has No Known Allergies.  MEDICATIONS:  Current Outpatient Medications  Medication Sig Dispense Refill  . amLODipine (NORVASC) 5 MG tablet Take 5 mg by mouth daily.     Marland Kitchen amoxicillin-clavulanate (AUGMENTIN) 875-125 MG tablet Take 1 tablet by mouth 2 (two) times daily. 14 tablet 0  . atenolol (TENORMIN) 50 MG tablet Take 50 mg by mouth daily.     . chlorpheniramine-HYDROcodone (TUSSIONEX) 10-8 MG/5ML SUER Take 5 mLs by mouth at bedtime as needed for cough. 115 mL 0  . diphenoxylate-atropine (LOMOTIL) 2.5-0.025 MG tablet Take 2 tablets by mouth 4 (four) times daily as needed for diarrhea or loose stools. 30 tablet 0  . fluticasone (FLONASE) 50 MCG/ACT nasal spray Place 2 sprays into both nostrils 2 (two) times daily as needed for allergies or rhinitis.  16 g 0  . furosemide (LASIX) 20 MG tablet Take 1 tablet (20 mg total) by mouth daily. 30 tablet 0  . gabapentin (NEURONTIN) 300 MG capsule Take 1 capsule (300 mg total) by mouth 3 (three) times daily.  (Patient taking differently: Take 300 mg by mouth 3 (three) times daily. Takes 1 q am and 1 pm and 2 q hs) 120 capsule 1  . letrozole (FEMARA) 2.5 MG tablet Take 1 tablet (2.5 mg total) by mouth daily. 30 tablet 3  . levofloxacin (LEVAQUIN) 500 MG tablet Take 1 tablet (500 mg total) by mouth daily. 5 tablet 0  . lidocaine-prilocaine (EMLA) cream Apply to affected area once 30 g 3  . LORazepam (ATIVAN) 0.5 MG tablet Take 1 tablet (0.5 mg total) by mouth at bedtime as needed for anxiety. 30 tablet 0  . omeprazole (PRILOSEC) 20 MG capsule Take 1 capsule (20 mg total) by mouth daily. 30 capsule 3  . ondansetron (ZOFRAN) 8 MG tablet Take 1 tablet (8 mg total) by mouth 2 (two) times daily as needed (Nausea or vomiting). 30 tablet 1  . potassium chloride SA (K-DUR) 20 MEQ tablet Take 1 tablet by mouth once daily 30 tablet 0  . rivaroxaban (XARELTO) 20 MG TABS tablet Take 1 tablet (20 mg total) by mouth daily with supper. 30 tablet 5  . traMADol (ULTRAM) 50 MG tablet Take 1 tablet (50 mg total) by mouth every 6 (six) hours as needed for moderate pain or severe pain. 30 tablet 0   No current facility-administered medications for this visit.     PHYSICAL EXAMINATION:  Vitals:   01/02/19 1205  BP: (!) 149/74  Pulse: 60  Resp: 17  Temp: 98.6 F (37 C)  SpO2: 100%   Filed Weights   01/02/19 1205  Weight: 139 lb 8 oz (63.3 kg)    GENERAL:alert, no distress and comfortable SKIN: no rash  EYES: sclera clear LUNGS: bilateral rhonci in bases. Respirations unlabored  NEURO: alert & oriented x 3 with fluent speech, normal gait   LABORATORY DATA:  I have reviewed the data as listed CBC Latest Ref Rng & Units 01/02/2019 12/15/2018 11/24/2018  WBC 4.0 - 10.5 K/uL 6.2 5.4 6.0  Hemoglobin 12.0 - 15.0 g/dL 10.8(L) 10.1(L) 10.2(L)  Hematocrit 36.0 - 46.0 % 33.5(L) 32.2(L) 32.3(L)  Platelets 150 - 400 K/uL 191 224 196     CMP Latest Ref Rng & Units 12/15/2018 11/24/2018 11/02/2018  Glucose 70 - 99 mg/dL  91 93 112(H)  BUN 8 - 23 mg/dL _0 Creatinine 0.44 - 1.00 mg/dL 1.02(H) 1.09(H) 1.20(H)  Sodium 135 - 145 mmol/L 139 137 139  Potassium 3.5 - 5.1 mmol/L 3.9 3.6 3.6  Chloride 98 - 111 mmol/L 105 104 106  CO2 22 - 32 mmol/L _1 Calcium 8.9 - 10.3 mg/dL 9.4 9.4 9.2  Total Protein 6.5 - 8.1 g/dL 7.1 7.0 7.0  Total Bilirubin 0.3 - 1.2 mg/dL 0.4 0.3 0.3  Alkaline Phos 38 - 126 U/L 106 92 98  AST 15 - 41 U/L _2 ALT 0 - 44 U/L _3 RADIOGRAPHIC STUDIES: I have personally reviewed the radiological images as listed and agreed with the findings in the report. No results found.   ASSESSMENT & PLAN: 63 year old female with metastatic breast cancer on letrozole, herceptin, and perjeta therapy   1. URI  -Symptoms persistent for 2 months now. Chest xray  on 5/29 was negative. She recently completed course of augmentin on 12/24/18. Symptoms did not improve. Question viral or allergic etiology; she does spend time outside, however claritin was not helpful. Will try nasal steroids with fluticasone and 2nd course of antibiotic therapy with levaquin 500 mg po daily x5 days.  2. 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 Burr Medico and herceptin and perjeta. Last dose 12/15/18.  All questions were answered. The patient knows to call the clinic with any problems, questions or concerns. No barriers to learning was detected.     Alla Feeling, NP 01/02/19

## 2019-01-03 ENCOUNTER — Encounter: Payer: Self-pay | Admitting: General Practice

## 2019-01-03 ENCOUNTER — Telehealth: Payer: Self-pay | Admitting: Nurse Practitioner

## 2019-01-03 NOTE — Telephone Encounter (Signed)
No los per 6/16. °

## 2019-01-03 NOTE — Progress Notes (Signed)
[name] CSW Progress Notes  Call from patient, concerned she is losing insurance coverage.  Is on long term disability from former employer, not sure what to do next.  Advised to call her HR department to understand her options and costs.  Advised to call Triage Cancer, [employer] Program - all of which can give her options for affordable insurance coverage if she loses current coverage through employer.  Contact information for these resources emailed to patient.  Teresa Shell, LCSW Clinical Social Worker Phone:  [phone]

## 2019-01-04 ENCOUNTER — Other Ambulatory Visit: Payer: Self-pay

## 2019-01-04 ENCOUNTER — Other Ambulatory Visit: Payer: Self-pay | Admitting: Hematology

## 2019-01-04 DIAGNOSIS — E876 Hypokalemia: Secondary | ICD-10-CM

## 2019-01-04 MED FILL — LETROZOLE 2.5 MG TABLET: 2.5 | 30 days supply | Qty: 30 | Fill #3

## 2019-01-04 NOTE — Progress Notes (Signed)
Teresa Hays   [phone] [phone]   Clinic Follow up Note   Patient Care Team: Levin Bacon, NP as PCP - General (Family Medicine) [name], MD as Consulting Physician (Gastroenterology) [name], MD as Consulting Physician (Hematology)  Date of Service:  01/05/2019  CHIEF COMPLAINT: F/u for metastatic breast cancer  SUMMARY OF ONCOLOGIC HISTORY: Oncology History Overview Note  Cancer Staging Metastatic breast cancer [facility]) Staging form: Breast, AJCC 8th Edition - Clinical stage from 03/24/2018: Stage IV (cT2, cN1, pM1, G3, ER+, PR+, HER2+) - Signed by [name], MD on 03/30/2018     Metastatic breast cancer ([place])  01/30/2018 Procedure   Colonoscopy showed small polyp in the sigmoid colon, removed, the exam of colon including the terminal ileum was otherwise negative.   01/30/2018 Procedure   EGD by Dr. Collene Mares showed small hiatal hernia, a 8 mm polypoid lesion in the cardia, biopsied.   03/09/2018 Imaging   03/09/2018 US Abdomen IMPRESSION: 1. Mass lesions throughout the liver, consistent with metastatic disease. Liver as a somewhat nodular contour suggesting underlying hepatic cirrhosis. Inhomogeneous echotexture to the liver.  2. Cholelithiasis with mild gallbladder wall thickening. A degree of cholecystitis cannot be excluded by ultrasound.  3. Portions of pancreas obscured by gas. Visualized portions of pancreas appear normal.  4. Small right kidney. Etiology uncertain. This finding potentially may be indicative of renal artery stenosis. In this regard, question whether patient is hypertensive.   03/15/2018 Imaging   CT CAP with contrast  IMPRESSION: 1. Widespread hepatic metastasis. 2. 2.6 cm lateral right breast soft tissue nodule could represent a breast primary or an incidental benign lesion. Consider correlation with mammogram and ultrasound. 3. No definite source of primary malignancy identified within the abdomen or  pelvis. There is possible rectosigmoid junction wall thickening. Consider colonoscopy with attention to this area. 4. Distal esophageal wall thickening, suggesting esophagitis.   03/24/2018 Cancer Staging   Staging form: Breast, AJCC 8th Edition - Clinical stage from 03/24/2018: Stage IV (cT2, cN1, pM1, G3, ER+, PR+, HER2+) - Signed by [name], MD on 03/30/2018   03/24/2018 Initial Biopsy   Diagnosis 1. 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 Breast, right, needle core biopsy, 9 o'clock, 7cm from nipple - INVASIVE DUCTAL CARCINOMA. -The carcinoma is somewhat morphologically dissimilar from that in part 1. It appears grade III 3. Lymph node, needle/core biopsy, right axillary - METASTATIC CARCINOMA IN 1 OF 1 LYMPH NODE (1/1).   03/24/2018 Receptors her2   Breast biopsy: 1. Estrogen Receptor: 40%, POSITIVE, STRONG-MODERATE STAINING INTENSITY Progesterone Receptor: 70%, POSITIVE, STRONG STAINING INTENSITY Proliferation Marker Ki67: 20% HER 2 equivocal by IHC 2+, POSITIVE by FISH, ratio 2.4 and copy #4.2  2. Estrogen Receptor: 60%, POSITIVE, MODERATE STAINING INTENSITY Progesterone Receptor: 40%, POSITIVE, MODERATE STAINING INTENSITY Proliferation Marker Ki67: 20% HER2 (+) by IHC 3+   03/24/2018 Initial Diagnosis   Metastatic breast cancer ([place])   03/27/2018 Pathology Results   Diagnosis Liver, needle/core biopsy, Right - METASTATIC CARCINOMA TO LIVER, CONSISTENT WITH PATIENTS CLINICAL HISTORY OF PRIMARY BREAST CARCINOMA.  ER 80%+ PR40%+ HER2- (by [facility], IHC 2+)  Ki67 50%    03/28/2018 Pathology Results   03/28/2018 Surgical Pathology Diagnosis 1. Breast, left, needle core biopsy, 9 o'clock - FIBROCYSTIC CHANGES. - THERE IS NO EVIDENCE OF MALIGNANCY. 2. Breast, left, needle core biopsy, 2 o'clock - FIBROADENOMA. - THERE IS NO EVIDENCE OF  MALIGNANCY. - SEE COMMENT.   03/29/2018 Imaging    03/29/2018 Bone Scan IMPRESSION: No scintigraphic evidence of osseous metastatic disease.   03/30/2018 Imaging   Bone scan  IMPRESSION: No scintigraphic evidence of osseous metastatic disease.    04/07/2018 -  Chemotherapy   First line chemo weekly Taxol and herceptin/Perjeta every 3 weeks starting 04/07/18. She developed infusion reaction to taxol and it was discontinued. Added Abraxane on C1D8, 2 weeks on/1 week off.  Abraxne stopped after 10/13/18 due to worsening Neuropathy   06/06/2018 Imaging   CT CAP IMPRESSION: 1. Generally improved appearance, with reduced axillary adenopathy and reduced enhancing component of the hepatic masses, with some of the hepatic mass is moderately smaller than on the prior exam. Reduced size of the right lateral breast mass compared to the prior 03/15/2018 exam. 2. New mild interstitial accentuation in the lungs, significance uncertain. Part of this appearance may be due to lower lung volumes on today's exam. 3. Mild wall thickening in the descending colon and upper rectum suggesting low-grade colitis/inflammation. Prominent stool throughout the colon favors constipation. 4. Other imaging findings of potential clinical significance: Aortic Atherosclerosis (ICD10-I70.0). Mild cardiomegaly. Mild nodularity in the right lower lobe appears stable. Contracted and thick-walled gallbladder.    09/18/2018 Imaging   CT CAP W Contrast 09/18/18  IMPRESSION: 1. Liver metastases have decreased in size. 2. Right breast mass, mild right axillary lymphadenopathy and scattered tiny right pulmonary nodules are all stable. 3. New mild left supraclavicular and left subpectoral lymphadenopathy, can not exclude progression of metastatic nodal disease. 4. Moderate colorectal stool volume, which may indicate constipation. 5.  Aortic Atherosclerosis (ICD10-I70.0).   10/2018 -  Anti-estrogen oral therapy   Letrozole 2.5 mg daily starting 10/2018      CURRENT THERAPY:  -First  line weekly TaxolwithHerceptin/Perjeta every 3 weeks starting 04/07/18. She developed infusion reaction to taxol and it was discontinued. Changed to Abraxaneweekly2 weeks on/1 week offfrom 07/21/2018.Abraxane held after October 13, 2018 due to his worsening neuropathy.  -Letrozole 2.5 mg daily starting 10/2018  INTERVAL HISTORY:  LATRELL REITAN is here for a follow up and treatment. She presents to the clinic alone. She notes she is doing well. She feels antibody and AI is doing well. She still feels chest congestion with phlegm that is white. She denies chest pain or SOB. She is able to take care of herself fully and independence. She denies any pain. She notes her appetite and eating is adequate. She notes still having moderate neuropathy in her hands and feet. The swelling in her right foot is going down and upper left chest edema resolved.    REVIEW OF SYSTEMS:   Constitutional: Denies fevers, chills or abnormal weight loss Eyes: Denies blurriness of vision Ears, nose, mouth, throat, and face: Denies mucositis or sore throat Respiratory: Denies dyspnea or wheezes (+) chest congestion, cough with white phlegm  Cardiovascular: Denies palpitation, chest discomfort (+) right lower extremity swelling is improving  Gastrointestinal:  Denies nausea, heartburn or change in bowel habits Skin: Denies abnormal skin rashes Lymphatics: Denies new lymphadenopathy or easy bruising Neurological:Denies numbness, tingling or new weaknesses Behavioral/Psych: Mood is stable, no new changes  All other systems were reviewed with the patient and are negative.  MEDICAL HISTORY:  Past Medical History:  Diagnosis Date  . GERD (gastroesophageal reflux disease)   . Hypertension   . rt breast ca with mets to liver dx'd 03/2018    SURGICAL HISTORY: Past Surgical History:  Procedure Laterality Date  .  COLONOSCOPY    . ESOPHAGOGASTRODUODENOSCOPY ENDOSCOPY    . IR IMAGING GUIDED PORT INSERTION  04/04/2018     I have reviewed the social history and family history with the patient and they are unchanged from previous note.  ALLERGIES:  has No Known Allergies.  MEDICATIONS:  Current Outpatient Medications  Medication Sig Dispense Refill  . amLODipine (NORVASC) 5 MG tablet Take 5 mg by mouth daily.     Marland Kitchen atenolol (TENORMIN) 50 MG tablet Take 50 mg by mouth daily.     . chlorpheniramine-HYDROcodone (TUSSIONEX) 10-8 MG/5ML SUER Take 5 mLs by mouth at bedtime as needed for cough. 115 mL 0  . diphenoxylate-atropine (LOMOTIL) 2.5-0.025 MG tablet Take 2 tablets by mouth 4 (four) times daily as needed for diarrhea or loose stools. 30 tablet 0  . fluticasone (FLONASE) 50 MCG/ACT nasal spray Place 2 sprays into both nostrils 2 (two) times daily as needed for allergies or rhinitis. 16 g 0  . furosemide (LASIX) 20 MG tablet Take 1 tablet (20 mg total) by mouth daily. 30 tablet 0  . gabapentin (NEURONTIN) 300 MG capsule Take 1 capsule (300 mg total) by mouth 3 (three) times daily. (Patient taking differently: Take 300 mg by mouth 3 (three) times daily. Takes 1 q am and 1 pm and 2 q hs) 120 capsule 1  . letrozole (FEMARA) 2.5 MG tablet Take 1 tablet (2.5 mg total) by mouth daily. 30 tablet 3  . levofloxacin (LEVAQUIN) 500 MG tablet Take 1 tablet (500 mg total) by mouth daily. 5 tablet 0  . lidocaine-prilocaine (EMLA) cream Apply to affected area once 30 g 3  . LORazepam (ATIVAN) 0.5 MG tablet Take 1 tablet (0.5 mg total) by mouth at bedtime as needed for anxiety. 30 tablet 0  . omeprazole (PRILOSEC) 20 MG capsule Take 1 capsule (20 mg total) by mouth daily. 30 capsule 3  . ondansetron (ZOFRAN) 8 MG tablet Take 1 tablet (8 mg total) by mouth 2 (two) times daily as needed (Nausea or vomiting). 30 tablet 1  . potassium chloride SA (K-DUR) 20 MEQ tablet TAKE 1 TABLET BY MOUTH ONCE DAILY 30 tablet 0  . rivaroxaban (XARELTO) 20 MG TABS tablet Take 1 tablet (20 mg total) by mouth daily with supper. 30 tablet 5  .  traMADol (ULTRAM) 50 MG tablet Take 1 tablet (50 mg total) by mouth every 6 (six) hours as needed for moderate pain or severe pain. 30 tablet 0   No current facility-administered medications for this visit.    Facility-Administered Medications Ordered in Other Visits  Medication Dose Route Frequency Provider Last Rate Last Dose  . acetaminophen (TYLENOL) tablet 650 mg  650 mg Oral Once [name], MD      . diphenhydrAMINE (BENADRYL) injection 25 mg  25 mg Intravenous Once [name], MD      . heparin lock flush 100 unit/mL  500 Units Intracatheter Once PRN [name], MD      . pertuzumab (PERJETA) 420 mg in sodium chloride 0.9 % 250 mL chemo infusion  420 mg Intravenous Once [name], MD      . sodium chloride flush (NS) 0.9 % injection 10 mL  10 mL Intracatheter PRN [name], MD      . trastuzumab (HERCEPTIN) 399 mg in sodium chloride 0.9 % 250 mL chemo infusion  6 mg/kg (Treatment Plan Recorded) Intravenous Once [name], MD        PHYSICAL EXAMINATION: ECOG PERFORMANCE STATUS: 1 - Symptomatic but  completely ambulatory  Vitals:   01/05/19 0958  BP: (!) 150/85  Pulse: (!) 58  Temp: 98.3 F (36.8 C)  SpO2: 100%   Filed Weights   01/05/19 0958  Weight: 137 lb 4.8 oz (62.3 kg)    GENERAL:alert, no distress and comfortable SKIN: skin color, texture, turgor are normal, no rashes or significant lesions EYES: normal, Conjunctiva are pink and non-injected, sclera clear  NECK: supple, thyroid normal size, non-tender, without nodularity LYMPH:  no palpable lymphadenopathy in the cervical, axillary  LUNGS: clear to auscultation and percussion with normal breathing effort HEART: regular rate & rhythm and no murmurs (+) Improved lower extremity edema (R>L) ABDOMEN:abdomen soft, non-tender and normal bowel sounds Musculoskeletal:no cyanosis of digits and no clubbing  NEURO: alert & oriented x 3 with fluent speech, no focal motor/sensory deficits  LABORATORY DATA:  I have reviewed the  data as listed CBC Latest Ref Rng & Units 01/05/2019 01/02/2019 12/15/2018  WBC 4.0 - 10.5 K/uL 6.3 6.2 5.4  Hemoglobin 12.0 - 15.0 g/dL 10.5(L) 10.8(L) 10.1(L)  Hematocrit 36.0 - 46.0 % 33.2(L) 33.5(L) 32.2(L)  Platelets 150 - 400 K/uL 171 191 224     CMP Latest Ref Rng & Units 01/05/2019 12/15/2018 11/24/2018  Glucose 70 - 99 mg/dL 80 91 93  BUN 8 - 23 mg/dL '16 12 19  '$ Creatinine 0.44 - 1.00 mg/dL 1.14(H) 1.02(H) 1.09(H)  Sodium 135 - 145 mmol/L 138 139 137  Potassium 3.5 - 5.1 mmol/L 4.1 3.9 3.6  Chloride 98 - 111 mmol/L 104 105 104  CO2 22 - 32 mmol/L '27 27 23  '$ Calcium 8.9 - 10.3 mg/dL 9.6 9.4 9.4  Total Protein 6.5 - 8.1 g/dL 7.3 7.1 7.0  Total Bilirubin 0.3 - 1.2 mg/dL 0.2(L) 0.4 0.3  Alkaline Phos 38 - 126 U/L 126 106 92  AST 15 - 41 U/L '27 24 24  '$ ALT 0 - 44 U/L '20 16 18      '$ RADIOGRAPHIC STUDIES: I have personally reviewed the radiological images as listed and agreed with the findings in the report. No results found.   ASSESSMENT & PLAN:  Teresa Hays is a 63 y.o. female with   1. Metastatic right breast cancer to liver, cT2N1M1, stage IV, ER+/PR+/HER2+, Liver mets ER+/PR+/HER2- -She was diagnosed in 03/2018.She presented with diffuse liver metastasis, tworight breast massesand right axillary adenopathy. Breast mass biopsy showed ER PR positive, but 1 was HER-2 positive, the other one was HER-2 negative. Liver met was HER2-.  -Given the metastatic disease, her cancer is not curable but still treatable. Shehas been treated withFirst-line Herceptin/Perjetaevery 3 weeks and weekly Taxol. Due to infusionreaction, taxol was switched to Abraxanefor 2 weeks on/1 week off.Tolerating well. We stopped Abraxane after cycle 10 due to worsening neuropathy.  -Her right breast mass is no longer palpable on exam, she continues to show good response. -She has startedanti-estrogen therapy with [facility] 2020 and is tolerating well.Will continue letrozole daily and  Herceptin/Perjeta every 3 weeks for as long as it controls her disease. She is agreeable.  -12/28/18 ECHO showed slight drop in EF at 55-60%, which is slightly lower than before. She will return to Dr Jeffie Pollock for repeat ECHO in 2 months.  -She is clinically doing well. Her moderate neuropathy and chest congestion persists. Her edema has improved. No pain currently.  -Labs reviewed, CBC and CMP WNL except Hg 10.5, 1.14, Tbili 0.2. Overall adequate to proceed with Herceptin and Perjeta today  -Plan for next scan before next  visit  -Continue Letrozole  -F/u in 3 weeks   2. Cough with clear phlegm and nasal congestion, URI   -Has been ongoing for almost 2 months, afebrile.  -She has had Sinus infection in the past. She had Rhonchi on 12/15/18 exam  -12/15/18 chest exam was clear  -I previously called in antibiotics without resolution -She still notes her chest congestion, cough with white phlegm persists.  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  -She has moderate tosignificantsensory deficit on exam  -Ihavestropped Abraxane on 10/13/18. -I previously encouraged her to take Vitamin B12 daily or Prenatal Vitamin that has B6.  -Her significant neuropathy is stable in hands and feet.  -She will continue gabapentin '300mg'$  bid and '600mg'$  HS along with once nightly tramadol as needed for significant pain.   4.Left UEDVT, Right LE edema -she developed LUE DVT on 08/04/2018, edema has resolved after anticoagulation  -Continue onXarelto '20mg'$  indefinitely, unless she has significant bleeding or other side effects. -She previously has developed LE edema, right significantly more than left. 10/13/18 doppler was negative for DVT. She has been using compression socks and has not skipped her Xarelto.  -I previously put her on 1  week dose of Lasix and potassium   -Her edema has much improved.   5. Anemia -she developed worseninganemia since she started chemotherapy -Continue monitoring, will consider blood transfusion if hemoglobin less than 8, always symptomatic anemia. -stable, hg at10.5today (01/05/19)   6Social support  -She lives alone, has her sister and niece in [place]. Her daughter lives in a few hours away  -On Ativan PRN due to anxietyabout diagnosis and treatment  7. Goal of care discussion  -she is full code -She understands the goal of care is palliative, to prolong her life, and improve her quality of life.   Plan: -I refilled her tramadol and Ativan today  -Labs reviewed and adequate to proceed with Herceptin/Perjeta today  -continue letrozole  -Lab, flush f/u and Herceptin/Perjetain 3 weeks with restaging CT CAP with contrast a few days before     No problem-specific Assessment & Plan notes found for this encounter.   Orders Placed This Encounter  Procedures  . CT Abdomen Pelvis W Contrast    Standing Status:   Future    Standing Expiration Date:   01/05/2020    Order Specific Question:   If indicated for the ordered procedure, I authorize the administration of contrast media per Radiology protocol    Answer:   Yes    Order Specific Question:   Preferred imaging location?    Answer:   [facility]    Order Specific Question:   Is Oral Contrast requested for this exam?    Answer:   Yes, Per Radiology protocol    Order Specific Question:   Radiology Contrast Protocol - do NOT remove file path    Answer:   \\[email or web address]  . CT Chest W Contrast    Standing Status:   Future    Standing Expiration Date:   01/05/2020    Order Specific Question:   If indicated for the ordered procedure, I authorize the administration of contrast media per Radiology protocol    Answer:   Yes    Order Specific Question:   Preferred imaging location?     Answer:   [facility]    Order Specific  Question:   Radiology Contrast Protocol - do NOT remove file path    Answer:   \\[email or web address]   All questions were answered. The patient knows to call the clinic with any problems, questions or concerns. No barriers to learning was detected. I spent 20 minutes counseling the patient face to face. The total time spent in the appointment was 25 minutes and more than 50% was on counseling and review of test results     [name], MD 01/05/2019   I, Joslyn Devon, am acting as scribe for [name], MD.   I have reviewed the above documentation for accuracy and completeness, and I agree with the above.

## 2019-01-05 ENCOUNTER — Other Ambulatory Visit: Payer: Self-pay

## 2019-01-05 ENCOUNTER — Telehealth: Payer: Self-pay | Admitting: Hematology

## 2019-01-05 ENCOUNTER — Inpatient Hospital Stay: Payer: 59

## 2019-01-05 ENCOUNTER — Encounter: Payer: Self-pay | Admitting: Hematology

## 2019-01-05 ENCOUNTER — Inpatient Hospital Stay (HOSPITAL_BASED_OUTPATIENT_CLINIC_OR_DEPARTMENT_OTHER): Payer: 59 | Admitting: Hematology

## 2019-01-05 VITALS — BP 150/85 | HR 58 | Temp 98.3°F | Ht 64.0 in | Wt 137.3 lb

## 2019-01-05 DIAGNOSIS — Z79811 Long term (current) use of aromatase inhibitors: Secondary | ICD-10-CM

## 2019-01-05 DIAGNOSIS — Z170 Estrogen receptor positive status [ER+]: Secondary | ICD-10-CM

## 2019-01-05 DIAGNOSIS — Z7951 Long term (current) use of inhaled steroids: Secondary | ICD-10-CM

## 2019-01-05 DIAGNOSIS — D6481 Anemia due to antineoplastic chemotherapy: Secondary | ICD-10-CM | POA: Diagnosis not present

## 2019-01-05 DIAGNOSIS — C50919 Malignant neoplasm of unspecified site of unspecified female breast: Secondary | ICD-10-CM

## 2019-01-05 DIAGNOSIS — C787 Secondary malignant neoplasm of liver and intrahepatic bile duct: Secondary | ICD-10-CM | POA: Diagnosis not present

## 2019-01-05 DIAGNOSIS — I10 Essential (primary) hypertension: Secondary | ICD-10-CM | POA: Diagnosis not present

## 2019-01-05 DIAGNOSIS — J069 Acute upper respiratory infection, unspecified: Secondary | ICD-10-CM | POA: Diagnosis not present

## 2019-01-05 DIAGNOSIS — K219 Gastro-esophageal reflux disease without esophagitis: Secondary | ICD-10-CM | POA: Diagnosis not present

## 2019-01-05 DIAGNOSIS — Z86718 Personal history of other venous thrombosis and embolism: Secondary | ICD-10-CM

## 2019-01-05 DIAGNOSIS — Z79899 Other long term (current) drug therapy: Secondary | ICD-10-CM

## 2019-01-05 DIAGNOSIS — Z7901 Long term (current) use of anticoagulants: Secondary | ICD-10-CM

## 2019-01-05 DIAGNOSIS — Z5112 Encounter for antineoplastic immunotherapy: Secondary | ICD-10-CM | POA: Diagnosis not present

## 2019-01-05 DIAGNOSIS — C50911 Malignant neoplasm of unspecified site of right female breast: Secondary | ICD-10-CM

## 2019-01-05 DIAGNOSIS — G620 Drug-induced polyneuropathy: Secondary | ICD-10-CM

## 2019-01-05 DIAGNOSIS — Z95828 Presence of other vascular implants and grafts: Secondary | ICD-10-CM

## 2019-01-05 DIAGNOSIS — F419 Anxiety disorder, unspecified: Secondary | ICD-10-CM

## 2019-01-05 DIAGNOSIS — Z7189 Other specified counseling: Secondary | ICD-10-CM

## 2019-01-05 LAB — CMP (CANCER CENTER ONLY)
ALT: 20 U/L (ref 0–44)
AST: 27 U/L (ref 15–41)
Albumin: 3.7 g/dL (ref 3.5–5.0)
Alkaline Phosphatase: 126 U/L (ref 38–126)
Anion gap: 7 (ref 5–15)
BUN: 16 mg/dL (ref 8–23)
CO2: 27 mmol/L (ref 22–32)
Calcium: 9.6 mg/dL (ref 8.9–10.3)
Chloride: 104 mmol/L (ref 98–111)
Creatinine: 1.14 mg/dL — ABNORMAL HIGH (ref 0.44–1.00)
GFR, Est AFR Am: 60 mL/min — ABNORMAL LOW (ref >60)
GFR, Estimated: 51 mL/min — ABNORMAL LOW (ref >60)
Glucose, Bld: 80 mg/dL (ref 70–99)
Potassium: 4.1 mmol/L (ref 3.5–5.1)
Sodium: 138 mmol/L (ref 135–145)
Total Bilirubin: 0.2 mg/dL — ABNORMAL LOW (ref 0.3–1.2)
Total Protein: 7.3 g/dL (ref 6.5–8.1)

## 2019-01-05 LAB — CBC WITH DIFFERENTIAL (CANCER CENTER ONLY)
Abs Immature Granulocytes: 0.01 10*3/uL (ref 0.00–0.07)
Basophils Absolute: 0.1 10*3/uL (ref 0.0–0.1)
Basophils Relative: 1 %
Eosinophils Absolute: 1 10*3/uL — ABNORMAL HIGH (ref 0.0–0.5)
Eosinophils Relative: 17 %
HCT: 33.2 % — ABNORMAL LOW (ref 36.0–46.0)
Hemoglobin: 10.5 g/dL — ABNORMAL LOW (ref 12.0–15.0)
Immature Granulocytes: 0 %
Lymphocytes Relative: 33 %
Lymphs Abs: 2.1 10*3/uL (ref 0.7–4.0)
MCH: 27.6 pg (ref 26.0–34.0)
MCHC: 31.6 g/dL (ref 30.0–36.0)
MCV: 87.1 fL (ref 80.0–100.0)
Monocytes Absolute: 0.3 10*3/uL (ref 0.1–1.0)
Monocytes Relative: 5 %
Neutro Abs: 2.8 10*3/uL (ref 1.7–7.7)
Neutrophils Relative %: 44 %
Platelet Count: 171 10*3/uL (ref 150–400)
RBC: 3.81 MIL/uL — ABNORMAL LOW (ref 3.87–5.11)
RDW: 15.9 % — ABNORMAL HIGH (ref 11.5–15.5)
WBC Count: 6.3 10*3/uL (ref 4.0–10.5)
nRBC: 0 % (ref 0.0–0.2)

## 2019-01-05 MED ORDER — SODIUM CHLORIDE 0.9% FLUSH
10.0000 mL | INTRAVENOUS | Status: DC | PRN
  Administered 2019-01-05: 10 mL
  Filled 2019-01-05: qty 10

## 2019-01-05 MED ORDER — ACETAMINOPHEN 325 MG PO TABS
650.0000 mg | ORAL_TABLET | Freq: Once | ORAL | Status: AC
  Administered 2019-01-05: 650 mg via ORAL

## 2019-01-05 MED ORDER — TRAMADOL HCL 50 MG PO TABS: 50.0000 mg | ORAL_TABLET | Freq: Four times a day (QID) | ORAL | 0 refills | Status: DC | PRN

## 2019-01-05 MED ORDER — ACETAMINOPHEN 325 MG PO TABS
ORAL_TABLET | ORAL | Status: AC
  Filled 2019-01-05: qty 2

## 2019-01-05 MED ORDER — TRASTUZUMAB CHEMO 150 MG IV SOLR
6.0000 mg/kg | Freq: Once | INTRAVENOUS | Status: AC
  Administered 2019-01-05: 12:00:00 399 mg via INTRAVENOUS
  Filled 2019-01-05: qty 19

## 2019-01-05 MED ORDER — DIPHENHYDRAMINE HCL 50 MG/ML IJ SOLN
25.0000 mg | Freq: Once | INTRAMUSCULAR | Status: AC
  Administered 2019-01-05: 25 mg via INTRAVENOUS

## 2019-01-05 MED ORDER — LORAZEPAM 0.5 MG PO TABS: 0.5000 mg | ORAL_TABLET | Freq: Every evening | ORAL | 0 refills | Status: DC | PRN

## 2019-01-05 MED ORDER — HEPARIN SOD (PORK) LOCK FLUSH 100 UNIT/ML IV SOLN
500.0000 [IU] | Freq: Once | INTRAVENOUS | Status: AC | PRN
  Administered 2019-01-05: 500 [IU]
  Filled 2019-01-05: qty 5

## 2019-01-05 MED ORDER — DIPHENHYDRAMINE HCL 50 MG/ML IJ SOLN
INTRAMUSCULAR | Status: AC
  Filled 2019-01-05: qty 1

## 2019-01-05 MED ORDER — SODIUM CHLORIDE 0.9 % IV SOLN
420.0000 mg | Freq: Once | INTRAVENOUS | Status: AC
  Administered 2019-01-05: 420 mg via INTRAVENOUS
  Filled 2019-01-05: qty 14

## 2019-01-05 MED ORDER — SODIUM CHLORIDE 0.9 % IV SOLN
Freq: Once | INTRAVENOUS | Status: AC
  Administered 2019-01-05: 11:00:00 via INTRAVENOUS
  Filled 2019-01-05: qty 250

## 2019-01-05 MED FILL — traMADol HCL 50 MG TABS: 50 | 7 days supply | Qty: 30 | Fill #0

## 2019-01-05 MED FILL — LORazepam 0.5 MG TABS: 0.5 | 30 days supply | Qty: 30 | Fill #0

## 2019-01-05 NOTE — Patient Instructions (Signed)
[facility] Discharge Instructions for Patients Receiving Chemotherapy  Today you received the following chemotherapy agents Trastuzumab (HERCEPTIN) & Pertuzumab (PERJETA).  To help prevent nausea and vomiting after your treatment, we encourage you to take your nausea medication as prescribed.   If you develop nausea and vomiting that is not controlled by your nausea medication, call the clinic.   BELOW ARE SYMPTOMS THAT SHOULD BE REPORTED IMMEDIATELY:  *FEVER GREATER THAN 100.5 F  *CHILLS WITH OR WITHOUT FEVER  NAUSEA AND VOMITING THAT IS NOT CONTROLLED WITH YOUR NAUSEA MEDICATION  *UNUSUAL SHORTNESS OF BREATH  *UNUSUAL BRUISING OR BLEEDING  TENDERNESS IN MOUTH AND THROAT WITH OR WITHOUT PRESENCE OF ULCERS  *URINARY PROBLEMS  *BOWEL PROBLEMS  UNUSUAL RASH Items with * indicate a potential emergency and should be followed up as soon as possible.  Feel free to call the clinic should you have any questions or concerns. The clinic phone number is [phone].  Please show the [facility] at check-in to the Emergency Department and triage nurse.  Coronavirus (COVID-19) Are you at risk?  Are you at risk for the Coronavirus (COVID-19)?  To be considered HIGH RISK for Coronavirus (COVID-19), you have to meet the following criteria:  . Traveled to [place], [place], [place], [place] or [place]; or in the [place] to [place], [place], [place], or [place]; and have fever, cough, and shortness of breath within the last 2 weeks of travel OR . Been in close contact with a person diagnosed with COVID-19 within the last 2 weeks and have fever, cough, and shortness of breath . IF YOU DO NOT MEET THESE CRITERIA, YOU ARE CONSIDERED LOW RISK FOR COVID-19.  What to do if you are HIGH RISK for COVID-19?  Marland Kitchen If you are having a medical emergency, call 911. . Seek medical care right away. Before you go to a doctor's office, urgent care or emergency department,  call ahead and tell them about your recent travel, contact with someone diagnosed with COVID-19, and your symptoms. You should receive instructions from your physician's office regarding next steps of care.  . When you arrive at healthcare provider, tell the healthcare staff immediately you have returned from visiting [place], [place], [place], [place] or [place]; or traveled in the [place] to [place], [place], [place], or [place]; in the last two weeks or you have been in close contact with a person diagnosed with COVID-19 in the last 2 weeks.   . Tell the health care staff about your symptoms: fever, cough and shortness of breath. . After you have been seen by a medical provider, you will be either: o Tested for (COVID-19) and discharged home on quarantine except to seek medical care if symptoms worsen, and asked to  - Stay home and avoid contact with others until you get your results (4-5 days)  - Avoid travel on public transportation if possible (such as bus, train, or airplane) or o Sent to the Emergency Department by EMS for evaluation, COVID-19 testing, and possible admission depending on your condition and test results.  What to do if you are LOW RISK for COVID-19?  Reduce your risk of any infection by using the same precautions used for avoiding the common cold or flu:  Marland Kitchen Wash your hands often with soap and warm water for at least 20 seconds.  If soap and water are not readily available, use an alcohol-based hand sanitizer with at least 60% alcohol.  Marland Kitchen  If coughing or sneezing, cover your mouth and nose by coughing or sneezing into the elbow areas of your shirt or coat, into a tissue or into your sleeve (not your hands). . Avoid shaking hands with others and consider head nods or verbal greetings only. . Avoid touching your eyes, nose, or mouth with unwashed hands.  . Avoid close contact with people who are sick. . Avoid places or events with large numbers of people in one  location, like concerts or sporting events. . Carefully consider travel plans you have or are making. . If you are planning any travel outside or inside the [place], visit the CDC's Travelers' Health webpage for the latest health notices. . If you have some symptoms but not all symptoms, continue to monitor at home and seek medical attention if your symptoms worsen. . If you are having a medical emergency, call 911.   ADDITIONAL HEALTHCARE OPTIONS FOR PATIENTS  [facility] / e-Visit: [email or web address]         [facility]: [phone]  [facility]: [phone]                   [facility]: [phone]   

## 2019-01-05 NOTE — Telephone Encounter (Signed)
Scheduled appt per 6/19 los.  Lab and port might have to be rescheduled depending on the CT appt when it gets scheduled.

## 2019-01-06 LAB — CANCER ANTIGEN 27.29: CA 27.29: 63.7 U/mL — ABNORMAL HIGH (ref 0.0–38.6)

## 2019-01-08 ENCOUNTER — Other Ambulatory Visit: Payer: Self-pay | Admitting: Hematology

## 2019-01-08 ENCOUNTER — Telehealth: Payer: Self-pay | Admitting: *Deleted

## 2019-01-08 MED ORDER — GUAIFENESIN-CODEINE 100-10 MG/5ML PO SOLN: 5.0000 mL | Freq: Four times a day (QID) | ORAL | 0 refills | Status: DC | PRN

## 2019-01-08 MED FILL — POTASSIUM CHLORIDE CRYS ER: 20 | 30 days supply | Qty: 30 | Fill #0

## 2019-01-08 MED FILL — VIRTUSSIN AC LIQUID: 100-10 | 20 days supply | Qty: 120 | Fill #0

## 2019-01-08 NOTE — Telephone Encounter (Signed)
Pt called stating still has the cough and it worsened.  Spoke with pt and was informed that pt had finished Levaquin ( took for 5 days ), still taking Tussionex at HS - prescribed by Regan Rakers, NP.  Stated NOTHING helps.  Denied fever,  Denied pain,  Denied shortness of breath,  Bowel functions fine.  Pt noted light yellow sputum since this am.   Pt wanted to know what else Dr. Burr Medico would recommend. Pt uses  [facility]. Pt's   Phone    [phone].

## 2019-01-08 NOTE — Telephone Encounter (Signed)
I will called in cough syrup.   Thu, could you call radiology to see if they can move her CT scan from 7/8 to this week? If yes, please reschedule her lab on 7/8 to 7/10 before OV, thanks   [name] MD

## 2019-01-09 NOTE — Telephone Encounter (Signed)
Spoke with pt and gave her detailed nstructions for CT scans on Wed. 01/10/19.   Pt understood to be NPO 4 hours prior to scans.   CT scans rescheduled for Wed  01/10/19 at 1 pm.

## 2019-01-10 ENCOUNTER — Ambulatory Visit (HOSPITAL_COMMUNITY)
Admission: RE | Admit: 2019-01-10 | Discharge: 2019-01-10 | Disposition: A | Discharge status: Home / Self Care | Payer: 59 | Source: Ambulatory Visit | Attending: Hematology | Admitting: Hematology

## 2019-01-10 ENCOUNTER — Other Ambulatory Visit: Payer: Self-pay

## 2019-01-10 DIAGNOSIS — C50919 Malignant neoplasm of unspecified site of unspecified female breast: Secondary | ICD-10-CM | POA: Insufficient documentation

## 2019-01-10 DIAGNOSIS — C50911 Malignant neoplasm of unspecified site of right female breast: Secondary | ICD-10-CM | POA: Diagnosis not present

## 2019-01-10 DIAGNOSIS — C787 Secondary malignant neoplasm of liver and intrahepatic bile duct: Secondary | ICD-10-CM | POA: Diagnosis not present

## 2019-01-10 MED ORDER — HEPARIN SOD (PORK) LOCK FLUSH 100 UNIT/ML IV SOLN
INTRAVENOUS | Status: AC
  Filled 2019-01-10: qty 5

## 2019-01-10 MED ORDER — IOHEXOL 300 MG/ML  SOLN
100.0000 mL | Freq: Once | INTRAMUSCULAR | Status: AC | PRN
  Administered 2019-01-10: 100 mL via INTRAVENOUS

## 2019-01-10 MED ORDER — HEPARIN SOD (PORK) LOCK FLUSH 100 UNIT/ML IV SOLN: 500.0000 [IU] | Freq: Once | INTRAVENOUS | Status: DC

## 2019-01-10 MED ORDER — SODIUM CHLORIDE (PF) 0.9 % IJ SOLN
INTRAMUSCULAR | Status: AC
  Filled 2019-01-10: qty 50

## 2019-01-10 MED ORDER — IOHEXOL 300 MG/ML  SOLN
30.0000 mL | Freq: Once | INTRAMUSCULAR | Status: AC | PRN
  Administered 2019-01-10: 30 mL via ORAL

## 2019-01-12 ENCOUNTER — Telehealth: Payer: Self-pay | Admitting: *Deleted

## 2019-01-12 NOTE — Telephone Encounter (Signed)
I have called her and spoke with her about her CT findings, she continues to have good response to treatment, no evidence of cancer progression, b/l lungs are clear, no signs of peumonia also. We discussed her cough symptoms could be related to allergy, and she will try allergy medication. She voiced good understanding and appreciated the call.   [name] MD

## 2019-01-12 NOTE — Telephone Encounter (Signed)
Pt called wanting to know results of CT scans done on 01/10/19.

## 2019-01-18 NOTE — Progress Notes (Signed)
[facility]   Telephone:([phone]) [phone] Fax:(336) [phone]   Clinic Follow up Note   Patient Care Team: Levin Bacon, NP as PCP - General (Family Medicine) [name], MD as Consulting Physician (Gastroenterology) [name], MD as Consulting Physician (Hematology)  Date of Service:  01/26/2019  CHIEF COMPLAINT: F/u for metastatic breast cancer  SUMMARY OF ONCOLOGIC HISTORY: Oncology History Overview Note  Cancer Staging Metastatic breast cancer [facility]) Staging form: Breast, AJCC 8th Edition - Clinical stage from 03/24/2018: Stage IV (cT2, cN1, pM1, G3, ER+, PR+, HER2+) - Signed by [name], MD on 03/30/2018     Metastatic breast cancer ([place])  01/30/2018 Procedure   Colonoscopy showed small polyp in the sigmoid colon, removed, the exam of colon including the terminal ileum was otherwise negative.   01/30/2018 Procedure   EGD by Dr. Collene Mares showed small hiatal hernia, a 8 mm polypoid lesion in the cardia, biopsied.   03/09/2018 Imaging   03/09/2018 US Abdomen IMPRESSION: 1. Mass lesions throughout the liver, consistent with metastatic disease. Liver as a somewhat nodular contour suggesting underlying hepatic cirrhosis. Inhomogeneous echotexture to the liver.  2. Cholelithiasis with mild gallbladder wall thickening. A degree of cholecystitis cannot be excluded by ultrasound.  3. Portions of pancreas obscured by gas. Visualized portions of pancreas appear normal.  4. Small right kidney. Etiology uncertain. This finding potentially may be indicative of renal artery stenosis. In this regard, question whether patient is hypertensive.   03/15/2018 Imaging   CT CAP with contrast  IMPRESSION: 1. Widespread hepatic metastasis. 2. 2.6 cm lateral right breast soft tissue nodule could represent a breast primary or an incidental benign lesion. Consider correlation with mammogram and ultrasound. 3. No definite source of primary malignancy identified within the abdomen or  pelvis. There is possible rectosigmoid junction wall thickening. Consider colonoscopy with attention to this area. 4. Distal esophageal wall thickening, suggesting esophagitis.   03/24/2018 Cancer Staging   Staging form: Breast, AJCC 8th Edition - Clinical stage from 03/24/2018: Stage IV (cT2, cN1, pM1, G3, ER+, PR+, HER2+) - Signed by [name], MD on 03/30/2018   03/24/2018 Initial Biopsy   Diagnosis 1. 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 Breast, right, needle core biopsy, 9 o'clock, 7cm from nipple - INVASIVE DUCTAL CARCINOMA. -The carcinoma is somewhat morphologically dissimilar from that in part 1. It appears grade III 3. Lymph node, needle/core biopsy, right axillary - METASTATIC CARCINOMA IN 1 OF 1 LYMPH NODE (1/1).   03/24/2018 Receptors her2   Breast biopsy: 1. Estrogen Receptor: 40%, POSITIVE, STRONG-MODERATE STAINING INTENSITY Progesterone Receptor: 70%, POSITIVE, STRONG STAINING INTENSITY Proliferation Marker Ki67: 20% HER 2 equivocal by IHC 2+, POSITIVE by FISH, ratio 2.4 and copy #4.2  2. Estrogen Receptor: 60%, POSITIVE, MODERATE STAINING INTENSITY Progesterone Receptor: 40%, POSITIVE, MODERATE STAINING INTENSITY Proliferation Marker Ki67: 20% HER2 (+) by IHC 3+   03/24/2018 Initial Diagnosis   Metastatic breast cancer ([place])   03/27/2018 Pathology Results   Diagnosis Liver, needle/core biopsy, Right - METASTATIC CARCINOMA TO LIVER, CONSISTENT WITH PATIENTS CLINICAL HISTORY OF PRIMARY BREAST CARCINOMA.  ER 80%+ PR40%+ HER2- (by [facility], IHC 2+)  Ki67 50%    03/28/2018 Pathology Results   03/28/2018 Surgical Pathology Diagnosis 1. Breast, left, needle core biopsy, 9 o'clock - FIBROCYSTIC CHANGES. - THERE IS NO EVIDENCE OF MALIGNANCY. 2. Breast, left, needle core biopsy, 2 o'clock - FIBROADENOMA. - THERE IS NO EVIDENCE OF  MALIGNANCY. - SEE COMMENT.   03/29/2018 Imaging    03/29/2018 Bone Scan IMPRESSION: No scintigraphic evidence of osseous metastatic disease.   03/30/2018 Imaging   Bone scan  IMPRESSION: No scintigraphic evidence of osseous metastatic disease.    04/07/2018 -  Chemotherapy   First line chemo weekly Taxol and herceptin/Perjeta every 3 weeks starting 04/07/18. She developed infusion reaction to taxol and it was discontinued. Added Abraxane on C1D8, 2 weeks on/1 week off.  Abraxne stopped after 10/13/18 due to worsening Neuropathy   06/06/2018 Imaging   CT CAP IMPRESSION: 1. Generally improved appearance, with reduced axillary adenopathy and reduced enhancing component of the hepatic masses, with some of the hepatic mass is moderately smaller than on the prior exam. Reduced size of the right lateral breast mass compared to the prior 03/15/2018 exam. 2. New mild interstitial accentuation in the lungs, significance uncertain. Part of this appearance may be due to lower lung volumes on today's exam. 3. Mild wall thickening in the descending colon and upper rectum suggesting low-grade colitis/inflammation. Prominent stool throughout the colon favors constipation. 4. Other imaging findings of potential clinical significance: Aortic Atherosclerosis (ICD10-I70.0). Mild cardiomegaly. Mild nodularity in the right lower lobe appears stable. Contracted and thick-walled gallbladder.    09/18/2018 Imaging   CT CAP W Contrast 09/18/18  IMPRESSION: 1. Liver metastases have decreased in size. 2. Right breast mass, mild right axillary lymphadenopathy and scattered tiny right pulmonary nodules are all stable. 3. New mild left supraclavicular and left subpectoral lymphadenopathy, can not exclude progression of metastatic nodal disease. 4. Moderate colorectal stool volume, which may indicate constipation. 5.  Aortic Atherosclerosis (ICD10-I70.0).   10/2018 -  Anti-estrogen oral therapy   Letrozole 2.5 mg daily starting 10/2018   01/10/2019 Imaging   CT CAP W  Contrast 01/10/19  IMPRESSION: 1. Continued improvement in the hepatic metastatic lesions which have reduced in size. 2. Stable mild left supraclavicular and subpectoral adenopathy. 3. Essentially stable small right lower lobe pulmonary nodule and separate small subpleural nodule along the right hemidiaphragm. Surveillance suggested. 4. Other imaging findings of potential clinical significance: Mild cardiomegaly. Mild circumferential distal esophageal wall thickening, the most common cause would be esophagitis. Airway thickening is present, suggesting bronchitis or reactive airways disease. Airway plugging in the lower lobes and in the right middle lobe. Stable small right adrenal adenoma.      CURRENT THERAPY:  -First line weekly TaxolwithHerceptin/Perjeta every 3 weeks starting 04/07/18. She developed infusion reaction to taxol and it was discontinued. Changed to Abraxaneweekly2 weeks on/1 week offfrom 07/21/2018.Abraxane held after October 13, 2018 due to his worsening neuropathy.  -Letrozole 2.5 mg daily starting 10/2018  INTERVAL HISTORY:  Teresa Hays is here for a follow up and treatment. She presents to the clinic alone. She notes her cough is nearly resolved but still has mild congestion. She denies fever or chills. Improving neuropathy of her fingers and toes with improved right foot swelling. She denies abdominal pain. She feels her energy is back 70%. She is able to function well. She has been taking 438m Gabapentin over the span of a day. She would like a refill. She notes she is tolerating letrozole well.     REVIEW OF SYSTEMS:   Constitutional: Denies fevers, chills or abnormal weight loss Eyes: Denies blurriness of vision Ears, nose, mouth, throat, and face: Denies mucositis or sore throat Respiratory: Denies cough, dyspnea or wheezes (+) chest congestion  Cardiovascular: Denies palpitation, chest discomfort or lower extremity swelling Gastrointestinal:  Denies  nausea, heartburn or change in bowel habits Skin: Denies abnormal skin rashes Lymphatics: Denies new lymphadenopathy or easy bruising Neurological: (+) Improving neuropathy of her fingers and toes with improved right foot swelling Behavioral/Psych: Mood is stable, no new changes  All other systems were reviewed with the patient and are negative.  MEDICAL HISTORY:  Past Medical History:  Diagnosis Date  . GERD (gastroesophageal reflux disease)   . Hypertension   . rt breast ca with mets to liver dx'd 03/2018    SURGICAL HISTORY: Past Surgical History:  Procedure Laterality Date  . COLONOSCOPY    . ESOPHAGOGASTRODUODENOSCOPY ENDOSCOPY    . IR IMAGING GUIDED PORT INSERTION  04/04/2018    I have reviewed the social history and family history with the patient and they are unchanged from previous note.  ALLERGIES:  has No Known Allergies.  MEDICATIONS:  Current Outpatient Medications  Medication Sig Dispense Refill  . amLODipine (NORVASC) 5 MG tablet Take 5 mg by mouth daily.     Marland Kitchen atenolol (TENORMIN) 50 MG tablet Take 50 mg by mouth daily.     . chlorpheniramine-HYDROcodone (TUSSIONEX) 10-8 MG/5ML SUER Take 5 mLs by mouth at bedtime as needed for cough. 115 mL 0  . diphenoxylate-atropine (LOMOTIL) 2.5-0.025 MG tablet Take 2 tablets by mouth 4 (four) times daily as needed for diarrhea or loose stools. 30 tablet 0  . fluticasone (FLONASE) 50 MCG/ACT nasal spray Place 2 sprays into both nostrils 2 (two) times daily as needed for allergies or rhinitis. 16 g 0  . furosemide (LASIX) 20 MG tablet Take 1 tablet (20 mg total) by mouth daily. 30 tablet 0  . gabapentin (NEURONTIN) 300 MG capsule Take 1 capsule (300 mg total) by mouth 3 (three) times daily. Take 2 capsules at night 120 capsule 3  . letrozole (FEMARA) 2.5 MG tablet Take 1 tablet (2.5 mg total) by mouth daily. 90 tablet 1  . levofloxacin (LEVAQUIN) 500 MG tablet Take 1 tablet (500 mg total) by mouth daily. 5 tablet 0  .  lidocaine-prilocaine (EMLA) cream Apply to affected area once 30 g 3  . LORazepam (ATIVAN) 0.5 MG tablet Take 1 tablet (0.5 mg total) by mouth at bedtime as needed for anxiety. 30 tablet 0  . omeprazole (PRILOSEC) 20 MG capsule TAKE 1 CAPSULE BY MOUTH ONCE DAILY 30 capsule 2  . ondansetron (ZOFRAN) 8 MG tablet Take 1 tablet (8 mg total) by mouth 2 (two) times daily as needed (Nausea or vomiting). 30 tablet 1  . potassium chloride SA (K-DUR) 20 MEQ tablet TAKE 1 TABLET BY MOUTH ONCE DAILY 30 tablet 0  . rivaroxaban (XARELTO) 20 MG TABS tablet Take 1 tablet (20 mg total) by mouth daily with supper. 30 tablet 5  . traMADol (ULTRAM) 50 MG tablet Take 1 tablet (50 mg total) by mouth every 6 (six) hours as needed for moderate pain or severe pain. 30 tablet 0  . albuterol (VENTOLIN HFA) 108 (90 Base) MCG/ACT inhaler Inhale 2 puffs into the lungs every 6 (six) hours as needed for wheezing or shortness of breath. 6.7 g 1   No current facility-administered medications for this visit.     PHYSICAL EXAMINATION: ECOG PERFORMANCE STATUS: 1 - Symptomatic but completely ambulatory  Vitals:   01/26/19 0919  BP: 137/84  Pulse: (!) 58  Resp: 18  Temp: 99.1 F (37.3 C)  SpO2: 100%   Filed Weights   01/26/19 0919  Weight: 139 lb 1.6 oz (63.1 kg)    GENERAL:alert, no  distress and comfortable SKIN: skin color, texture, turgor are normal, no rashes or significant lesions EYES: normal, Conjunctiva are pink and non-injected, sclera clear  NECK: supple, thyroid normal size, non-tender, without nodularity LYMPH:  no palpable lymphadenopathy in the cervical, axillary  LUNGS: clear to auscultation and percussion with normal breathing effort (+) Mild wheezing  HEART: regular rate & rhythm and no murmurs and no lower extremity edema ABDOMEN:abdomen soft, non-tender and normal bowel sounds Musculoskeletal:no cyanosis of digits and no clubbing  NEURO: alert & oriented x 3 with fluent speech, no focal  motor/sensory deficits  LABORATORY DATA:  I have reviewed the data as listed CBC Latest Ref Rng & Units 01/26/2019 01/05/2019 01/02/2019  WBC 4.0 - 10.5 K/uL 6.0 6.3 6.2  Hemoglobin 12.0 - 15.0 g/dL 10.6(L) 10.5(L) 10.8(L)  Hematocrit 36.0 - 46.0 % 33.7(L) 33.2(L) 33.5(L)  Platelets 150 - 400 K/uL 179 171 191     CMP Latest Ref Rng & Units 01/26/2019 01/05/2019 12/15/2018  Glucose 70 - 99 mg/dL 97 80 91  BUN 8 - 23 mg/dL _0 Creatinine 0.44 - 1.00 mg/dL 1.12(H) 1.14(H) 1.02(H)  Sodium 135 - 145 mmol/L 138 138 139  Potassium 3.5 - 5.1 mmol/L 3.9 4.1 3.9  Chloride 98 - 111 mmol/L 104 104 105  CO2 22 - 32 mmol/L _1 Calcium 8.9 - 10.3 mg/dL 9.4 9.6 9.4  Total Protein 6.5 - 8.1 g/dL 7.2 7.3 7.1  Total Bilirubin 0.3 - 1.2 mg/dL 0.4 0.2(L) 0.4  Alkaline Phos 38 - 126 U/L 130(H) 126 106  AST 15 - 41 U/L _2 ALT 0 - 44 U/L _3 RADIOGRAPHIC STUDIES: I have personally reviewed the radiological images as listed and agreed with the findings in the report. No results found.   ASSESSMENT & PLAN:  KEIGAN GIRTEN is a 63 y.o. female with   1. Metastatic right breast cancer to liver, cT2N1M1, stage IV, ER+/PR+/HER2+, Liver mets ER+/PR+/HER2- -She was diagnosed in 03/2018.She presented with diffuse liver metastasis, tworight breast massesand right axillary adenopathy. Breast mass biopsy showed ER PR positive, but 1 was HER-2 positive, the other one was HER-2 negative. Liver met was HER2-.  -Given the metastatic disease, her cancer is not curable but still treatable. Shehas been treated withFirst-line Herceptin/Perjetaevery 3 weeks and weekly Taxol. Due to infusionreaction, taxol was switched to Abraxanefor 2 weeks on/1 week off.Tolerating well. We stoppedAbraxaneafter cycle 10 due to worsening neuropathy.  -Her right breast mass is no longer palpable on exam, she continues to show good response. -She has startedanti-estrogen therapy with [facility]  April 2020 and is tolerating well.Will continue letrozole daily and Herceptin/Perjeta every 3 weeks for as long as it controls her disease. She is agreeable. -I personally reviewed and discussed her CT CAP from 01/10/19 which shows liver lesion has reduced in size, stable left supraclavicular LN, stable small right pulmonary nodule, will monitor. Overall good response, will continue current regimen.  -She is clinically doing well and improving. Her neuropathy on feet and hands is improving. Her chest congestion is mild with wheezing.  -Labs reviewed, CBC and CMP WNL except Hg 10.6, Cr 1.12, Alk Phos 130. Overall adequate to proceed with Herceptin and Perjeta today  -With normal Potassium she can reduce oral potassium to every other day.  -Continue Letrozole  -F/u in 3 weeks   2. Bronchitis  -Has been ongoing for almost 2 months, afebrile.  -She has had Sinus  infection in the past. She had Rhonchi on 12/15/18 exam  -Her CT chest from 01/10/19 shows Airway thickening is present, suggesting bronchitis or reactive airways disease.  -Cough is resolving but still has mild chest congestion  -She has mild wheezing on exam today (01/26/19) -Since she already completed antibiotics, I recommend she use albuterol inhaler to help with her wheezing and congestion. She is agreeable. I called in today   3.Peripheral neuropathy, grade 2 -Secondarytochemotherapy,mainly in her feet -Herneuropathyhas increased to significantpain in feet, R>L and mildly in fingers.  -She has moderate tosignificantsensory deficit on exam  -I previouslystropped Abraxane on 10/13/18. -Ipreviouslyencouragedher to take Vitamin B12 daily or Prenatal Vitamin that has B6.  -She will continue gabapentin 379m bid and 6094mHS along with once nightly tramadol as needed for significant pain.  -Improving neuropathy of her fingers and toes   4.Left UEDVT, Right LE edema -she developed LUE DVT on 08/04/2018, edema has  resolved after anticoagulation -Continue onXarelto 2010mndefinitely, unless she has significant bleeding or other side effects. -Shepreviouslyhas developed LE edema, right significantly more than left.10/13/18 doppler was negative for DVT. She has been using compression socks and has not skipped her Xarelto.  -I previously put her on 1 week dose of Lasix and potassium  -Her edema has much improved.  5. Anemia -she developed worseninganemia since she started chemotherapy, improved since she came off chemo   -Continue monitoring, will consider blood transfusion if hemoglobin less than 8, always symptomatic anemia. -stable, hg at10.6today (01/26/19)   6Social support  -She lives alone, [place] sister and niece in [place] away  -On Ativan PRN due to anxietyabout diagnosis and treatment  7. Goal of care discussion  -she is full code -She understands the goal of care is palliative, to prolong her life, and improve her quality of life.   Plan: -CT CAP reviewed, overall good response -I called in albuterol inhaler today. I refilled gabapentin today  -Labs reviewed and adequate to proceed with Herceptin/Perjeta today  -continue letrozole  -Lab, flushf/u andHerceptin/Perjetain 3weeks    No problem-specific Assessment & Plan notes found for this encounter.   No orders of the defined types were placed in this encounter.  All questions were answered. The patient knows to call the clinic with any problems, questions or concerns. No barriers to learning was detected. I spent 20 minutes counseling the patient face to face. The total time spent in the appointment was 25 minutes and more than 50% was on counseling and review of test results     YanTruitt MerleD 01/26/2019   I, AmoJoslyn Devonm acting as scribe for YanTruitt MerleD.   I have reviewed the above documentation for accuracy and completeness, and I agree with the above.

## 2019-01-24 ENCOUNTER — Other Ambulatory Visit: Payer: 59

## 2019-01-24 ENCOUNTER — Other Ambulatory Visit: Payer: Self-pay | Admitting: Hematology

## 2019-01-24 ENCOUNTER — Ambulatory Visit (HOSPITAL_COMMUNITY): Payer: 59

## 2019-01-24 DIAGNOSIS — C50919 Malignant neoplasm of unspecified site of unspecified female breast: Secondary | ICD-10-CM

## 2019-01-24 MED FILL — XARELTO 20 MG TABLET: 20 | 30 days supply | Qty: 30 | Fill #2

## 2019-01-24 MED FILL — OMEPRAZOLE 20 MG CPDR: 20 | 30 days supply | Qty: 30 | Fill #0

## 2019-01-26 ENCOUNTER — Inpatient Hospital Stay: Payer: 59

## 2019-01-26 ENCOUNTER — Other Ambulatory Visit: Payer: 59

## 2019-01-26 ENCOUNTER — Inpatient Hospital Stay: Payer: 59 | Attending: Hematology | Admitting: Hematology

## 2019-01-26 ENCOUNTER — Encounter: Payer: Self-pay | Admitting: Hematology

## 2019-01-26 ENCOUNTER — Other Ambulatory Visit: Payer: Self-pay

## 2019-01-26 VITALS — BP 137/84 | HR 58 | Temp 99.1°F | Resp 18 | Ht 64.0 in | Wt 139.1 lb

## 2019-01-26 DIAGNOSIS — G620 Drug-induced polyneuropathy: Secondary | ICD-10-CM | POA: Diagnosis not present

## 2019-01-26 DIAGNOSIS — J40 Bronchitis, not specified as acute or chronic: Secondary | ICD-10-CM | POA: Insufficient documentation

## 2019-01-26 DIAGNOSIS — I10 Essential (primary) hypertension: Secondary | ICD-10-CM | POA: Diagnosis not present

## 2019-01-26 DIAGNOSIS — Z79899 Other long term (current) drug therapy: Secondary | ICD-10-CM | POA: Insufficient documentation

## 2019-01-26 DIAGNOSIS — Z79811 Long term (current) use of aromatase inhibitors: Secondary | ICD-10-CM | POA: Insufficient documentation

## 2019-01-26 DIAGNOSIS — Z86718 Personal history of other venous thrombosis and embolism: Secondary | ICD-10-CM | POA: Diagnosis not present

## 2019-01-26 DIAGNOSIS — Z7901 Long term (current) use of anticoagulants: Secondary | ICD-10-CM | POA: Insufficient documentation

## 2019-01-26 DIAGNOSIS — Z170 Estrogen receptor positive status [ER+]: Secondary | ICD-10-CM | POA: Diagnosis not present

## 2019-01-26 DIAGNOSIS — Z5112 Encounter for antineoplastic immunotherapy: Secondary | ICD-10-CM | POA: Insufficient documentation

## 2019-01-26 DIAGNOSIS — R062 Wheezing: Secondary | ICD-10-CM | POA: Insufficient documentation

## 2019-01-26 DIAGNOSIS — C50919 Malignant neoplasm of unspecified site of unspecified female breast: Secondary | ICD-10-CM

## 2019-01-26 DIAGNOSIS — Z7189 Other specified counseling: Secondary | ICD-10-CM

## 2019-01-26 DIAGNOSIS — D6481 Anemia due to antineoplastic chemotherapy: Secondary | ICD-10-CM | POA: Insufficient documentation

## 2019-01-26 DIAGNOSIS — C787 Secondary malignant neoplasm of liver and intrahepatic bile duct: Secondary | ICD-10-CM | POA: Insufficient documentation

## 2019-01-26 DIAGNOSIS — Z95828 Presence of other vascular implants and grafts: Secondary | ICD-10-CM

## 2019-01-26 DIAGNOSIS — M7989 Other specified soft tissue disorders: Secondary | ICD-10-CM | POA: Insufficient documentation

## 2019-01-26 DIAGNOSIS — F419 Anxiety disorder, unspecified: Secondary | ICD-10-CM | POA: Insufficient documentation

## 2019-01-26 DIAGNOSIS — K210 Gastro-esophageal reflux disease with esophagitis: Secondary | ICD-10-CM | POA: Diagnosis not present

## 2019-01-26 DIAGNOSIS — C50911 Malignant neoplasm of unspecified site of right female breast: Secondary | ICD-10-CM | POA: Insufficient documentation

## 2019-01-26 LAB — CBC WITH DIFFERENTIAL (CANCER CENTER ONLY)
Abs Immature Granulocytes: 0.01 10*3/uL (ref 0.00–0.07)
Basophils Absolute: 0.1 10*3/uL (ref 0.0–0.1)
Basophils Relative: 1 %
Eosinophils Absolute: 1 10*3/uL — ABNORMAL HIGH (ref 0.0–0.5)
Eosinophils Relative: 17 %
HCT: 33.7 % — ABNORMAL LOW (ref 36.0–46.0)
Hemoglobin: 10.6 g/dL — ABNORMAL LOW (ref 12.0–15.0)
Immature Granulocytes: 0 %
Lymphocytes Relative: 34 %
Lymphs Abs: 2 10*3/uL (ref 0.7–4.0)
MCH: 26.8 pg (ref 26.0–34.0)
MCHC: 31.5 g/dL (ref 30.0–36.0)
MCV: 85.3 fL (ref 80.0–100.0)
Monocytes Absolute: 0.3 10*3/uL (ref 0.1–1.0)
Monocytes Relative: 6 %
Neutro Abs: 2.6 10*3/uL (ref 1.7–7.7)
Neutrophils Relative %: 42 %
Platelet Count: 179 10*3/uL (ref 150–400)
RBC: 3.95 MIL/uL (ref 3.87–5.11)
RDW: 14.9 % (ref 11.5–15.5)
WBC Count: 6 10*3/uL (ref 4.0–10.5)
nRBC: 0 % (ref 0.0–0.2)

## 2019-01-26 LAB — CMP (CANCER CENTER ONLY)
ALT: 19 U/L (ref 0–44)
AST: 31 U/L (ref 15–41)
Albumin: 3.7 g/dL (ref 3.5–5.0)
Alkaline Phosphatase: 130 U/L — ABNORMAL HIGH (ref 38–126)
Anion gap: 8 (ref 5–15)
BUN: 13 mg/dL (ref 8–23)
CO2: 26 mmol/L (ref 22–32)
Calcium: 9.4 mg/dL (ref 8.9–10.3)
Chloride: 104 mmol/L (ref 98–111)
Creatinine: 1.12 mg/dL — ABNORMAL HIGH (ref 0.44–1.00)
GFR, Est AFR Am: >60 mL/min (ref >60)
GFR, Estimated: 53 mL/min — ABNORMAL LOW (ref >60)
Glucose, Bld: 97 mg/dL (ref 70–99)
Potassium: 3.9 mmol/L (ref 3.5–5.1)
Sodium: 138 mmol/L (ref 135–145)
Total Bilirubin: 0.4 mg/dL (ref 0.3–1.2)
Total Protein: 7.2 g/dL (ref 6.5–8.1)

## 2019-01-26 MED ORDER — SODIUM CHLORIDE 0.9 % IV SOLN
420.0000 mg | Freq: Once | INTRAVENOUS | Status: AC
  Administered 2019-01-26: 420 mg via INTRAVENOUS
  Filled 2019-01-26: qty 14

## 2019-01-26 MED ORDER — ACETAMINOPHEN 325 MG PO TABS
ORAL_TABLET | ORAL | Status: AC
  Filled 2019-01-26: qty 2

## 2019-01-26 MED ORDER — SODIUM CHLORIDE 0.9% FLUSH
10.0000 mL | INTRAVENOUS | Status: DC | PRN
  Administered 2019-01-26: 10 mL
  Filled 2019-01-26: qty 10

## 2019-01-26 MED ORDER — DIPHENHYDRAMINE HCL 50 MG/ML IJ SOLN
25.0000 mg | Freq: Once | INTRAMUSCULAR | Status: AC
  Administered 2019-01-26: 25 mg via INTRAVENOUS

## 2019-01-26 MED ORDER — ACETAMINOPHEN 325 MG PO TABS
650.0000 mg | ORAL_TABLET | Freq: Once | ORAL | Status: AC
  Administered 2019-01-26: 650 mg via ORAL

## 2019-01-26 MED ORDER — GABAPENTIN 300 MG PO CAPS: 300.0000 mg | ORAL_CAPSULE | Freq: Three times a day (TID) | ORAL | 3 refills | Status: DC

## 2019-01-26 MED ORDER — HEPARIN SOD (PORK) LOCK FLUSH 100 UNIT/ML IV SOLN
500.0000 [IU] | Freq: Once | INTRAVENOUS | Status: AC | PRN
  Administered 2019-01-26: 500 [IU]
  Filled 2019-01-26: qty 5

## 2019-01-26 MED ORDER — TRASTUZUMAB CHEMO 150 MG IV SOLR
6.0000 mg/kg | Freq: Once | INTRAVENOUS | Status: AC
  Administered 2019-01-26: 399 mg via INTRAVENOUS
  Filled 2019-01-26: qty 19

## 2019-01-26 MED ORDER — DIPHENHYDRAMINE HCL 50 MG/ML IJ SOLN
INTRAMUSCULAR | Status: AC
  Filled 2019-01-26: qty 1

## 2019-01-26 MED ORDER — SODIUM CHLORIDE 0.9 % IV SOLN
Freq: Once | INTRAVENOUS | Status: AC
  Administered 2019-01-26: 10:00:00 via INTRAVENOUS
  Filled 2019-01-26: qty 250

## 2019-01-26 MED ORDER — ALBUTEROL SULFATE HFA 108 (90 BASE) MCG/ACT IN AERS: 2.0000 | INHALATION_SPRAY | Freq: Four times a day (QID) | RESPIRATORY_TRACT | 1 refills | Status: DC | PRN

## 2019-01-26 MED ORDER — LETROZOLE 2.5 MG PO TABS: 2.5000 mg | ORAL_TABLET | Freq: Every day | ORAL | 1 refills | Status: DC

## 2019-01-26 MED FILL — ALBUTEROL SULFATE HFA 108 (: 108 (90 BAS | 25 days supply | Qty: 18 | Fill #0

## 2019-01-26 MED FILL — GABAPENTIN 300 MG CAPSULE: 300 | 30 days supply | Qty: 120 | Fill #0

## 2019-01-26 NOTE — Patient Instructions (Signed)
[facility] Discharge Instructions for Patients Receiving Chemotherapy  Today you received the following chemotherapy agents Trastuzumab (HERCEPTIN) & Pertuzumab (PERJETA).  To help prevent nausea and vomiting after your treatment, we encourage you to take your nausea medication as prescribed.   If you develop nausea and vomiting that is not controlled by your nausea medication, call the clinic.   BELOW ARE SYMPTOMS THAT SHOULD BE REPORTED IMMEDIATELY:  *FEVER GREATER THAN 100.5 F  *CHILLS WITH OR WITHOUT FEVER  NAUSEA AND VOMITING THAT IS NOT CONTROLLED WITH YOUR NAUSEA MEDICATION  *UNUSUAL SHORTNESS OF BREATH  *UNUSUAL BRUISING OR BLEEDING  TENDERNESS IN MOUTH AND THROAT WITH OR WITHOUT PRESENCE OF ULCERS  *URINARY PROBLEMS  *BOWEL PROBLEMS  UNUSUAL RASH Items with * indicate a potential emergency and should be followed up as soon as possible.  Feel free to call the clinic should you have any questions or concerns. The clinic phone number is [phone].  Please show the [facility] at check-in to the Emergency Department and triage nurse.  Coronavirus (COVID-19) Are you at risk?  Are you at risk for the Coronavirus (COVID-19)?  To be considered HIGH RISK for Coronavirus (COVID-19), you have to meet the following criteria:  . Traveled to [place], [place], [place], [place] or [place]; or in the [place] to [place], [place], [place], or [place]; and have fever, cough, and shortness of breath within the last 2 weeks of travel OR . Been in close contact with a person diagnosed with COVID-19 within the last 2 weeks and have fever, cough, and shortness of breath . IF YOU DO NOT MEET THESE CRITERIA, YOU ARE CONSIDERED LOW RISK FOR COVID-19.  What to do if you are HIGH RISK for COVID-19?  Marland Kitchen If you are having a medical emergency, call 911. . Seek medical care right away. Before you go to a doctor's office, urgent care or emergency department,  call ahead and tell them about your recent travel, contact with someone diagnosed with COVID-19, and your symptoms. You should receive instructions from your physician's office regarding next steps of care.  . When you arrive at healthcare provider, tell the healthcare staff immediately you have returned from visiting [place], [place], [place], [place] or [place]; or traveled in the [place] to [place], [place], [place], or [place]; in the last two weeks or you have been in close contact with a person diagnosed with COVID-19 in the last 2 weeks.   . Tell the health care staff about your symptoms: fever, cough and shortness of breath. . After you have been seen by a medical provider, you will be either: o Tested for (COVID-19) and discharged home on quarantine except to seek medical care if symptoms worsen, and asked to  - Stay home and avoid contact with others until you get your results (4-5 days)  - Avoid travel on public transportation if possible (such as bus, train, or airplane) or o Sent to the Emergency Department by EMS for evaluation, COVID-19 testing, and possible admission depending on your condition and test results.  What to do if you are LOW RISK for COVID-19?  Reduce your risk of any infection by using the same precautions used for avoiding the common cold or flu:  Marland Kitchen Wash your hands often with soap and warm water for at least 20 seconds.  If soap and water are not readily available, use an alcohol-based hand sanitizer with at least 60% alcohol.  Marland Kitchen  If coughing or sneezing, cover your mouth and nose by coughing or sneezing into the elbow areas of your shirt or coat, into a tissue or into your sleeve (not your hands). . Avoid shaking hands with others and consider head nods or verbal greetings only. . Avoid touching your eyes, nose, or mouth with unwashed hands.  . Avoid close contact with people who are sick. . Avoid places or events with large numbers of people in one  location, like concerts or sporting events. . Carefully consider travel plans you have or are making. . If you are planning any travel outside or inside the [place], visit the CDC's Travelers' Health webpage for the latest health notices. . If you have some symptoms but not all symptoms, continue to monitor at home and seek medical attention if your symptoms worsen. . If you are having a medical emergency, call 911.   ADDITIONAL HEALTHCARE OPTIONS FOR PATIENTS  [facility] / e-Visit: [email or web address]         [facility]: [phone]  [facility]: [phone]                   [facility]: [phone]   

## 2019-01-29 ENCOUNTER — Telehealth: Payer: Self-pay | Admitting: Hematology

## 2019-01-29 NOTE — Telephone Encounter (Signed)
No los per 7/10. °

## 2019-02-05 ENCOUNTER — Other Ambulatory Visit: Payer: Self-pay | Admitting: Hematology

## 2019-02-05 DIAGNOSIS — E876 Hypokalemia: Secondary | ICD-10-CM

## 2019-02-05 MED FILL — POTASSIUM CHLORIDE CRYS ER: 20 | 30 days supply | Qty: 30 | Fill #0

## 2019-02-05 MED FILL — AMLODIPINE BESYLATE 5 MG TA: 5 | 90 days supply | Qty: 90 | Fill #0

## 2019-02-05 MED FILL — ATENOLOL 50 MG TABLET: 50 | 90 days supply | Qty: 90 | Fill #0

## 2019-02-05 MED FILL — LETROZOLE 2.5 MG TABLET: 2.5 | 90 days supply | Qty: 90 | Fill #0

## 2019-02-12 ENCOUNTER — Other Ambulatory Visit: Payer: Self-pay | Admitting: Hematology

## 2019-02-12 ENCOUNTER — Other Ambulatory Visit: Payer: Self-pay | Admitting: Nurse Practitioner

## 2019-02-12 MED ORDER — TRAMADOL HCL 50 MG PO TABS: 50.0000 mg | ORAL_TABLET | Freq: Four times a day (QID) | ORAL | 0 refills | Status: DC | PRN

## 2019-02-12 MED FILL — traMADol HCL 50 MG TABS: 50 | 7 days supply | Qty: 30 | Fill #0

## 2019-02-12 NOTE — Progress Notes (Signed)
[name]   [phone] [phone]   Clinic Follow up Note   Patient Care Team: Levin Bacon, NP as PCP - General (Family Medicine) [name], MD as Consulting Physician (Gastroenterology) [name], MD as Consulting Physician (Hematology)  Date of Service:  02/16/2019  CHIEF COMPLAINT: F/u for metastatic breast cancer  SUMMARY OF ONCOLOGIC HISTORY: Oncology History Overview Note  Cancer Staging Metastatic breast cancer [facility]) Staging form: Breast, AJCC 8th Edition - Clinical stage from 03/24/2018: Stage IV (cT2, cN1, pM1, G3, ER+, PR+, HER2+) - Signed by [name], MD on 03/30/2018     Metastatic breast cancer ([place])  01/30/2018 Procedure   Colonoscopy showed small polyp in the sigmoid colon, removed, the exam of colon including the terminal ileum was otherwise negative.   01/30/2018 Procedure   EGD by Dr. Collene Mares showed small hiatal hernia, a 8 mm polypoid lesion in the cardia, biopsied.   03/09/2018 Imaging   03/09/2018 US Abdomen IMPRESSION: 1. Mass lesions throughout the liver, consistent with metastatic disease. Liver as a somewhat nodular contour suggesting underlying hepatic cirrhosis. Inhomogeneous echotexture to the liver.  2. Cholelithiasis with mild gallbladder wall thickening. A degree of cholecystitis cannot be excluded by ultrasound.  3. Portions of pancreas obscured by gas. Visualized portions of pancreas appear normal.  4. Small right kidney. Etiology uncertain. This finding potentially may be indicative of renal artery stenosis. In this regard, question whether patient is hypertensive.   03/15/2018 Imaging   CT CAP with contrast  IMPRESSION: 1. Widespread hepatic metastasis. 2. 2.6 cm lateral right breast soft tissue nodule could represent a breast primary or an incidental benign lesion. Consider correlation with mammogram and ultrasound. 3. No definite source of primary malignancy identified within the abdomen or  pelvis. There is possible rectosigmoid junction wall thickening. Consider colonoscopy with attention to this area. 4. Distal esophageal wall thickening, suggesting esophagitis.   03/24/2018 Cancer Staging   Staging form: Breast, AJCC 8th Edition - Clinical stage from 03/24/2018: Stage IV (cT2, cN1, pM1, G3, ER+, PR+, HER2+) - Signed by [name], MD on 03/30/2018   03/24/2018 Initial Biopsy   Diagnosis 1. 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 Breast, right, needle core biopsy, 9 o'clock, 7cm from nipple - INVASIVE DUCTAL CARCINOMA. -The carcinoma is somewhat morphologically dissimilar from that in part 1. It appears grade III 3. Lymph node, needle/core biopsy, right axillary - METASTATIC CARCINOMA IN 1 OF 1 LYMPH NODE (1/1).   03/24/2018 Receptors her2   Breast biopsy: 1. Estrogen Receptor: 40%, POSITIVE, STRONG-MODERATE STAINING INTENSITY Progesterone Receptor: 70%, POSITIVE, STRONG STAINING INTENSITY Proliferation Marker Ki67: 20% HER 2 equivocal by IHC 2+, POSITIVE by FISH, ratio 2.4 and copy #4.2  2. Estrogen Receptor: 60%, POSITIVE, MODERATE STAINING INTENSITY Progesterone Receptor: 40%, POSITIVE, MODERATE STAINING INTENSITY Proliferation Marker Ki67: 20% HER2 (+) by IHC 3+   03/24/2018 Initial Diagnosis   Metastatic breast cancer ([place])   03/27/2018 Pathology Results   Diagnosis Liver, needle/core biopsy, Right - METASTATIC CARCINOMA TO LIVER, CONSISTENT WITH PATIENTS CLINICAL HISTORY OF PRIMARY BREAST CARCINOMA.  ER 80%+ PR40%+ HER2- (by [facility], IHC 2+)  Ki67 50%    03/28/2018 Pathology Results   03/28/2018 Surgical Pathology Diagnosis 1. Breast, left, needle core biopsy, 9 o'clock - FIBROCYSTIC CHANGES. - THERE IS NO EVIDENCE OF MALIGNANCY. 2. Breast, left, needle core biopsy, 2 o'clock - FIBROADENOMA. - THERE IS NO EVIDENCE OF  MALIGNANCY. - SEE COMMENT.   03/29/2018 Imaging    03/29/2018 Bone Scan IMPRESSION: No scintigraphic evidence of osseous metastatic disease.   03/30/2018 Imaging   Bone scan  IMPRESSION: No scintigraphic evidence of osseous metastatic disease.    04/07/2018 -  Chemotherapy   First line chemo weekly Taxol and herceptin/Perjeta every 3 weeks starting 04/07/18. She developed infusion reaction to taxol and it was discontinued. Added Abraxane on C1D8, 2 weeks on/1 week off.  Abraxne stopped after 10/13/18 due to worsening Neuropathy   06/06/2018 Imaging   CT CAP IMPRESSION: 1. Generally improved appearance, with reduced axillary adenopathy and reduced enhancing component of the hepatic masses, with some of the hepatic mass is moderately smaller than on the prior exam. Reduced size of the right lateral breast mass compared to the prior 03/15/2018 exam. 2. New mild interstitial accentuation in the lungs, significance uncertain. Part of this appearance may be due to lower lung volumes on today's exam. 3. Mild wall thickening in the descending colon and upper rectum suggesting low-grade colitis/inflammation. Prominent stool throughout the colon favors constipation. 4. Other imaging findings of potential clinical significance: Aortic Atherosclerosis (ICD10-I70.0). Mild cardiomegaly. Mild nodularity in the right lower lobe appears stable. Contracted and thick-walled gallbladder.    09/18/2018 Imaging   CT CAP W Contrast 09/18/18  IMPRESSION: 1. Liver metastases have decreased in size. 2. Right breast mass, mild right axillary lymphadenopathy and scattered tiny right pulmonary nodules are all stable. 3. New mild left supraclavicular and left subpectoral lymphadenopathy, can not exclude progression of metastatic nodal disease. 4. Moderate colorectal stool volume, which may indicate constipation. 5.  Aortic Atherosclerosis (ICD10-I70.0).   10/2018 -  Anti-estrogen oral therapy   Letrozole 2.5 mg daily starting 10/2018   01/10/2019 Imaging   CT CAP W  Contrast 01/10/19  IMPRESSION: 1. Continued improvement in the hepatic metastatic lesions which have reduced in size. 2. Stable mild left supraclavicular and subpectoral adenopathy. 3. Essentially stable small right lower lobe pulmonary nodule and separate small subpleural nodule along the right hemidiaphragm. Surveillance suggested. 4. Other imaging findings of potential clinical significance: Mild cardiomegaly. Mild circumferential distal esophageal wall thickening, the most common cause would be esophagitis. Airway thickening is present, suggesting bronchitis or reactive airways disease. Airway plugging in the lower lobes and in the right middle lobe. Stable small right adrenal adenoma.      CURRENT THERAPY:  -First line weekly TaxolwithHerceptin/Perjeta every 3 weeks starting 04/07/18. She developed infusion reaction to taxol and it was discontinued. Changed to Abraxaneweekly2 weeks on/1 week offfrom 07/21/2018.Abraxane held after October 13, 2018 due to his worsening neuropathy. -Letrozole 2.5 mg daily starting 10/2018  INTERVAL HISTORY:  Teresa Hays is here for a follow up and treatment. She presents to the clinic alone. She notes she is doing well. She notes right foot swelling still with mild pain. She also notes neuropathy still from her feet and her fingers. It has improved. She puts her feet up and puts ice on it. She continues compression socks and feet elevation.    REVIEW OF SYSTEMS:   Constitutional: Denies fevers, chills or abnormal weight loss Eyes: Denies blurriness of vision Ears, nose, mouth, throat, and face: Denies mucositis or sore throat Respiratory: Denies cough, dyspnea or wheezes Cardiovascular: Denies palpitation, chest discomfort (+) right lower extremity swelling of foot Gastrointestinal:  Denies nausea, heartburn or change in bowel habits Skin: Denies abnormal skin rashes Lymphatics: Denies new lymphadenopathy or easy bruising Neurological:  (+) Neuropathy of feet and fingers  improving Behavioral/Psych: Mood is stable, no new changes  All other systems were reviewed with the patient and are negative.  MEDICAL HISTORY:  Past Medical History:  Diagnosis Date  . GERD (gastroesophageal reflux disease)   . Hypertension   . rt breast ca with mets to liver dx'd 03/2018    SURGICAL HISTORY: Past Surgical History:  Procedure Laterality Date  . COLONOSCOPY    . ESOPHAGOGASTRODUODENOSCOPY ENDOSCOPY    . IR IMAGING GUIDED PORT INSERTION  04/04/2018    I have reviewed the social history and family history with the patient and they are unchanged from previous note.  ALLERGIES:  has No Known Allergies.  MEDICATIONS:  Current Outpatient Medications  Medication Sig Dispense Refill  . albuterol (VENTOLIN HFA) 108 (90 Base) MCG/ACT inhaler Inhale 2 puffs into the lungs every 6 (six) hours as needed for wheezing or shortness of breath. 6.7 g 1  . amLODipine (NORVASC) 5 MG tablet Take 5 mg by mouth daily.     Marland Kitchen atenolol (TENORMIN) 50 MG tablet Take 50 mg by mouth daily.     . chlorpheniramine-HYDROcodone (TUSSIONEX) 10-8 MG/5ML SUER Take 5 mLs by mouth at bedtime as needed for cough. 115 mL 0  . diphenoxylate-atropine (LOMOTIL) 2.5-0.025 MG tablet Take 2 tablets by mouth 4 (four) times daily as needed for diarrhea or loose stools. 30 tablet 0  . fluticasone (FLONASE) 50 MCG/ACT nasal spray Place 2 sprays into both nostrils 2 (two) times daily as needed for allergies or rhinitis. 16 g 0  . furosemide (LASIX) 20 MG tablet Take 1 tablet (20 mg total) by mouth daily. 30 tablet 0  . gabapentin (NEURONTIN) 300 MG capsule Take 1 capsule (300 mg total) by mouth 3 (three) times daily. Take 2 capsules at night 120 capsule 3  . letrozole (FEMARA) 2.5 MG tablet Take 1 tablet (2.5 mg total) by mouth daily. 90 tablet 1  . lidocaine-prilocaine (EMLA) cream Apply to affected area once 30 g 3  . LORazepam (ATIVAN) 0.5 MG tablet Take 1 tablet (0.5 mg total)  by mouth at bedtime as needed for anxiety. 30 tablet 0  . omeprazole (PRILOSEC) 20 MG capsule TAKE 1 CAPSULE BY MOUTH ONCE DAILY 30 capsule 2  . ondansetron (ZOFRAN) 8 MG tablet Take 1 tablet (8 mg total) by mouth 2 (two) times daily as needed (Nausea or vomiting). 30 tablet 1  . potassium chloride SA (K-DUR) 20 MEQ tablet TAKE 1 TABLET BY MOUTH ONCE DAILY 30 tablet 0  . rivaroxaban (XARELTO) 20 MG TABS tablet Take 1 tablet (20 mg total) by mouth daily with supper. 30 tablet 5  . traMADol (ULTRAM) 50 MG tablet Take 1 tablet (50 mg total) by mouth every 6 (six) hours as needed for moderate pain or severe pain. 30 tablet 0   No current facility-administered medications for this visit.     PHYSICAL EXAMINATION: ECOG PERFORMANCE STATUS: 1 - Symptomatic but completely ambulatory  Vitals:   02/16/19 0929  BP: (!) 142/82  Pulse: 69  Resp: 18  Temp: 98.3 F (36.8 C)  SpO2: 99%   Filed Weights   02/16/19 0929  Weight: 139 lb 8 oz (63.3 kg)    GENERAL:alert, no distress and comfortable SKIN: skin color, texture, turgor are normal, no rashes or significant lesions EYES: normal, Conjunctiva are pink and non-injected, sclera clear  NECK: supple, thyroid normal size, non-tender, without nodularity LYMPH:  no palpable lymphadenopathy in the cervical, axillary  LUNGS: clear to auscultation and percussion with normal  breathing effort HEART: regular rate & rhythm and no murmurs and no lower extremity edema ABDOMEN:abdomen soft, non-tender and normal bowel sounds Musculoskeletal:no cyanosis of digits and no clubbing  NEURO: alert & oriented x 3 with fluent speech, no focal motor/sensory deficits BREAST: No palpable mass, nodules or adenopathy bilaterally.   LABORATORY DATA:  I have reviewed the data as listed CBC Latest Ref Rng & Units 02/16/2019 01/26/2019 01/05/2019  WBC 4.0 - 10.5 K/uL 6.7 6.0 6.3  Hemoglobin 12.0 - 15.0 g/dL 10.8(L) 10.6(L) 10.5(L)  Hematocrit 36.0 - 46.0 % 33.6(L) 33.7(L)  33.2(L)  Platelets 150 - 400 K/uL 199 179 171     CMP Latest Ref Rng & Units 01/26/2019 01/05/2019 12/15/2018  Glucose 70 - 99 mg/dL 97 80 91  BUN 8 - 23 mg/dL _0 Creatinine 0.44 - 1.00 mg/dL 1.12(H) 1.14(H) 1.02(H)  Sodium 135 - 145 mmol/L 138 138 139  Potassium 3.5 - 5.1 mmol/L 3.9 4.1 3.9  Chloride 98 - 111 mmol/L 104 104 105  CO2 22 - 32 mmol/L _1 Calcium 8.9 - 10.3 mg/dL 9.4 9.6 9.4  Total Protein 6.5 - 8.1 g/dL 7.2 7.3 7.1  Total Bilirubin 0.3 - 1.2 mg/dL 0.4 0.2(L) 0.4  Alkaline Phos 38 - 126 U/L 130(H) 126 106  AST 15 - 41 U/L _2 ALT 0 - 44 U/L _3 RADIOGRAPHIC STUDIES: I have personally reviewed the radiological images as listed and agreed with the findings in the report. No results found.   ASSESSMENT & PLAN:  ASHLYND MICHNA is a 64 y.o. female with   1. Metastatic right breast cancer to liver, cT2N1M1, stage IV, ER+/PR+/HER2+, Liver mets ER+/PR+/HER2- -She was diagnosed in 03/2018.She presented with diffuse liver metastasis, tworight breast massesand right axillary adenopathy. Breast mass biopsy showed ER PR positive, but 1 was HER-2 positive, the other one was HER-2 negative. Liver met was HER2-.  -Given the metastatic disease, her cancer is not curable but still treatable. Shehas been treated withFirst-line Herceptin/Perjetaevery 3 weeks and weekly Taxol. Due to infusionreaction, taxol was switched to Abraxanefor 2 weeks on/1 week off.Tolerating well. We stoppedAbraxaneafter cycle 10 due to worsening neuropathy.  -Her right breast mass is no longer palpable on exam, she continues to show good response. -She has startedanti-estrogen therapy with [facility] 2020 and is tolerating well.Will continue letrozole daily and Herceptin/Perjeta every 3 weeks for as long as it controls her disease. She is agreeable. -Plan to repeat scan in September or October 2020.  -Her congestion and neuropathy are slowly improving. Her  right LE edema is more constant lately. Plan to do Doppler soon.  -No palpable mass on breast exam today, no hepatomegaly. Labs reviewed, CBC and CMP WNL except Hg 10.8, Cr 1.12. Ca 27.29 still pending, has been stable lately. Overall adequate to proceed with Herceptin and Perjeta today  -Continue Letrozole  -F/u in 6 weeks  2. Bronchitis  -Has been ongoing foralmost 2 months, afebrile.  -She has had Sinus infection in the past. She had Rhonchi on 5/29/20exam -Her CT chest from 01/10/19 shows Airway thickening is present, suggesting bronchitis or reactive airways disease.  -Cough is resolving but still has mild chest congestion  -Since she already completed antibiotics, I previously called in albuterol.  -She notes her congestion improved some.   3.Peripheral neuropathy, grade 2 -Secondarytochemotherapy,mainly in her feet -Herneuropathyhas increased to significantpain in feet, R>L and mildly in fingers.  -She  has moderate tosignificantsensory deficit on exam  -I previouslystropped Abraxane on 10/13/18. -Ipreviouslyencouragedher to take Vitamin B12 daily or Prenatal Vitamin that has B6.  -She will continuegabapentin 316m nightly with once nightly tramadol as needed for significant pain.  -Neuropathy of her fingers and toes continues to improve  4.Left UEDVT, Right LE edema -she developed LUE DVT on 08/04/2018, edema has resolved after anticoagulation -Continue onXarelto 271mindefinitely, unless she has significant bleeding or other side effects. -Shepreviouslydeveloped LE edema, right significantly more than left.10/13/18 doppler was negative for DVT.  -Ipreviouslyput her on 1 week dose of Lasix and potassium -Her edema of her right LE is more constant now. I will do a doppler of right leg in the next few days to rule out DVT, although my suspicion is not very high since she is on Xarelto  -I encouraged her to buy tighter compression socks.   5. Anemia  -she developed worseninganemia since she started chemotherapy, improved since she came off chemo   -Continue monitoring, will consider blood transfusion if hemoglobin less than 8, always symptomatic anemia. -stable, hg at10.8today (02/16/19)   6Social support  -She lives alone, [place] sister [place]  -On Ativan PRN due to anxietyabout diagnosis and treatment  7. Goal of care discussion  -she is full code -She understands the goal of care is palliative, to prolong her life, and improve her quality of life.   Plan: -[place] right leg next Tuesday(pt's reference) to rule out DVT  -Labs reviewed and adequate to proceed with Herceptin/Perjeta today -continue letrozole -Lab, flushandHerceptin/Perjetaevery 3 weeks  -F/u with me in 6 weeks    No problem-specific Assessment & Plan notes found for this encounter.   No orders of the defined types were placed in this encounter.  All questions were answered. The patient knows to call the clinic with any problems, questions or concerns. No barriers to learning was detected. I spent 20 minutes counseling the patient face to face. The total time spent in the appointment was 25 minutes and more than 50% was on counseling and review of test results     YaTruitt MerleMD 02/16/2019   I, AmJoslyn Devonam acting as scribe for YaTruitt MerleMD.   I have reviewed the above documentation for accuracy and completeness, and I agree with the above.

## 2019-02-16 ENCOUNTER — Inpatient Hospital Stay: Payer: 59

## 2019-02-16 ENCOUNTER — Inpatient Hospital Stay (HOSPITAL_BASED_OUTPATIENT_CLINIC_OR_DEPARTMENT_OTHER): Payer: 59 | Admitting: Hematology

## 2019-02-16 ENCOUNTER — Other Ambulatory Visit: Payer: Self-pay

## 2019-02-16 VITALS — BP 142/82 | HR 69 | Temp 98.3°F | Resp 18 | Ht 64.0 in | Wt 139.5 lb

## 2019-02-16 DIAGNOSIS — R062 Wheezing: Secondary | ICD-10-CM

## 2019-02-16 DIAGNOSIS — Z79899 Other long term (current) drug therapy: Secondary | ICD-10-CM

## 2019-02-16 DIAGNOSIS — J40 Bronchitis, not specified as acute or chronic: Secondary | ICD-10-CM

## 2019-02-16 DIAGNOSIS — C50919 Malignant neoplasm of unspecified site of unspecified female breast: Secondary | ICD-10-CM

## 2019-02-16 DIAGNOSIS — Z79811 Long term (current) use of aromatase inhibitors: Secondary | ICD-10-CM | POA: Diagnosis not present

## 2019-02-16 DIAGNOSIS — G620 Drug-induced polyneuropathy: Secondary | ICD-10-CM

## 2019-02-16 DIAGNOSIS — I10 Essential (primary) hypertension: Secondary | ICD-10-CM

## 2019-02-16 DIAGNOSIS — Z5112 Encounter for antineoplastic immunotherapy: Secondary | ICD-10-CM | POA: Diagnosis not present

## 2019-02-16 DIAGNOSIS — Z170 Estrogen receptor positive status [ER+]: Secondary | ICD-10-CM

## 2019-02-16 DIAGNOSIS — C50911 Malignant neoplasm of unspecified site of right female breast: Secondary | ICD-10-CM

## 2019-02-16 DIAGNOSIS — M7989 Other specified soft tissue disorders: Secondary | ICD-10-CM | POA: Diagnosis not present

## 2019-02-16 DIAGNOSIS — Z95828 Presence of other vascular implants and grafts: Secondary | ICD-10-CM

## 2019-02-16 DIAGNOSIS — Z86718 Personal history of other venous thrombosis and embolism: Secondary | ICD-10-CM

## 2019-02-16 DIAGNOSIS — Z7901 Long term (current) use of anticoagulants: Secondary | ICD-10-CM

## 2019-02-16 DIAGNOSIS — F419 Anxiety disorder, unspecified: Secondary | ICD-10-CM

## 2019-02-16 DIAGNOSIS — K210 Gastro-esophageal reflux disease with esophagitis: Secondary | ICD-10-CM

## 2019-02-16 DIAGNOSIS — D6481 Anemia due to antineoplastic chemotherapy: Secondary | ICD-10-CM

## 2019-02-16 DIAGNOSIS — Z7189 Other specified counseling: Secondary | ICD-10-CM

## 2019-02-16 DIAGNOSIS — C787 Secondary malignant neoplasm of liver and intrahepatic bile duct: Secondary | ICD-10-CM | POA: Diagnosis not present

## 2019-02-16 LAB — CBC WITH DIFFERENTIAL (CANCER CENTER ONLY)
Abs Immature Granulocytes: 0.01 10*3/uL (ref 0.00–0.07)
Basophils Absolute: 0.1 10*3/uL (ref 0.0–0.1)
Basophils Relative: 1 %
Eosinophils Absolute: 0.6 10*3/uL — ABNORMAL HIGH (ref 0.0–0.5)
Eosinophils Relative: 9 %
HCT: 33.6 % — ABNORMAL LOW (ref 36.0–46.0)
Hemoglobin: 10.8 g/dL — ABNORMAL LOW (ref 12.0–15.0)
Immature Granulocytes: 0 %
Lymphocytes Relative: 30 %
Lymphs Abs: 2 10*3/uL (ref 0.7–4.0)
MCH: 27.6 pg (ref 26.0–34.0)
MCHC: 32.1 g/dL (ref 30.0–36.0)
MCV: 85.7 fL (ref 80.0–100.0)
Monocytes Absolute: 0.4 10*3/uL (ref 0.1–1.0)
Monocytes Relative: 5 %
Neutro Abs: 3.7 10*3/uL (ref 1.7–7.7)
Neutrophils Relative %: 55 %
Platelet Count: 199 10*3/uL (ref 150–400)
RBC: 3.92 MIL/uL (ref 3.87–5.11)
RDW: 14.6 % (ref 11.5–15.5)
WBC Count: 6.7 10*3/uL (ref 4.0–10.5)
nRBC: 0 % (ref 0.0–0.2)

## 2019-02-16 LAB — CMP (CANCER CENTER ONLY)
ALT: 20 U/L (ref 0–44)
AST: 29 U/L (ref 15–41)
Albumin: 3.8 g/dL (ref 3.5–5.0)
Alkaline Phosphatase: 131 U/L — ABNORMAL HIGH (ref 38–126)
Anion gap: 7 (ref 5–15)
BUN: 17 mg/dL (ref 8–23)
CO2: 29 mmol/L (ref 22–32)
Calcium: 9.7 mg/dL (ref 8.9–10.3)
Chloride: 104 mmol/L (ref 98–111)
Creatinine: 1.03 mg/dL — ABNORMAL HIGH (ref 0.44–1.00)
GFR, Est AFR Am: >60 mL/min (ref >60)
GFR, Estimated: 58 mL/min — ABNORMAL LOW (ref >60)
Glucose, Bld: 99 mg/dL (ref 70–99)
Potassium: 3.8 mmol/L (ref 3.5–5.1)
Sodium: 140 mmol/L (ref 135–145)
Total Bilirubin: 0.3 mg/dL (ref 0.3–1.2)
Total Protein: 7.5 g/dL (ref 6.5–8.1)

## 2019-02-16 MED ORDER — TRASTUZUMAB CHEMO 150 MG IV SOLR
6.0000 mg/kg | Freq: Once | INTRAVENOUS | Status: AC
  Administered 2019-02-16: 399 mg via INTRAVENOUS
  Filled 2019-02-16: qty 19

## 2019-02-16 MED ORDER — DIPHENHYDRAMINE HCL 25 MG PO CAPS
ORAL_CAPSULE | ORAL | Status: AC
  Filled 2019-02-16: qty 2

## 2019-02-16 MED ORDER — SODIUM CHLORIDE 0.9 % IV SOLN
420.0000 mg | Freq: Once | INTRAVENOUS | Status: AC
  Administered 2019-02-16: 420 mg via INTRAVENOUS
  Filled 2019-02-16: qty 14

## 2019-02-16 MED ORDER — ACETAMINOPHEN 325 MG PO TABS
ORAL_TABLET | ORAL | Status: AC
  Filled 2019-02-16: qty 2

## 2019-02-16 MED ORDER — ACETAMINOPHEN 325 MG PO TABS
650.0000 mg | ORAL_TABLET | Freq: Once | ORAL | Status: AC
  Administered 2019-02-16: 650 mg via ORAL

## 2019-02-16 MED ORDER — HEPARIN SOD (PORK) LOCK FLUSH 100 UNIT/ML IV SOLN
500.0000 [IU] | Freq: Once | INTRAVENOUS | Status: AC | PRN
  Administered 2019-02-16: 500 [IU]
  Filled 2019-02-16: qty 5

## 2019-02-16 MED ORDER — SODIUM CHLORIDE 0.9% FLUSH
10.0000 mL | INTRAVENOUS | Status: DC | PRN
  Administered 2019-02-16: 10 mL
  Filled 2019-02-16: qty 10

## 2019-02-16 MED ORDER — SODIUM CHLORIDE 0.9 % IV SOLN
Freq: Once | INTRAVENOUS | Status: AC
  Administered 2019-02-16: 10:00:00 via INTRAVENOUS
  Filled 2019-02-16: qty 250

## 2019-02-16 MED ORDER — DIPHENHYDRAMINE HCL 50 MG/ML IJ SOLN
25.0000 mg | Freq: Once | INTRAMUSCULAR | Status: AC
  Administered 2019-02-16: 25 mg via INTRAVENOUS

## 2019-02-16 MED ORDER — DIPHENHYDRAMINE HCL 50 MG/ML IJ SOLN
INTRAMUSCULAR | Status: AC
  Filled 2019-02-16: qty 1

## 2019-02-16 NOTE — Patient Instructions (Signed)
[facility] Discharge Instructions for Patients Receiving Chemotherapy  Today you received the following chemotherapy agents Trastuzumab (HERCEPTIN) & Pertuzumab (PERJETA).  To help prevent nausea and vomiting after your treatment, we encourage you to take your nausea medication as prescribed.   If you develop nausea and vomiting that is not controlled by your nausea medication, call the clinic.   BELOW ARE SYMPTOMS THAT SHOULD BE REPORTED IMMEDIATELY:  *FEVER GREATER THAN 100.5 F  *CHILLS WITH OR WITHOUT FEVER  NAUSEA AND VOMITING THAT IS NOT CONTROLLED WITH YOUR NAUSEA MEDICATION  *UNUSUAL SHORTNESS OF BREATH  *UNUSUAL BRUISING OR BLEEDING  TENDERNESS IN MOUTH AND THROAT WITH OR WITHOUT PRESENCE OF ULCERS  *URINARY PROBLEMS  *BOWEL PROBLEMS  UNUSUAL RASH Items with * indicate a potential emergency and should be followed up as soon as possible.  Feel free to call the clinic should you have any questions or concerns. The clinic phone number is [phone].  Please show the [place] at check-in to the Emergency Department and triage nurse.

## 2019-02-17 ENCOUNTER — Encounter: Payer: Self-pay | Admitting: Hematology

## 2019-02-17 LAB — CANCER ANTIGEN 27.29: CA 27.29: 57.7 U/mL — ABNORMAL HIGH (ref 0.0–38.6)

## 2019-02-17 MED FILL — OMEPRAZOLE 20 MG CAP: 20 | 30 days supply | Qty: 30 | Fill #1

## 2019-02-19 ENCOUNTER — Telehealth: Payer: Self-pay | Admitting: Hematology

## 2019-02-19 MED FILL — XARELTO 20 MG TABLET: 20 | 30 days supply | Qty: 30 | Fill #3

## 2019-02-19 NOTE — Telephone Encounter (Signed)
Scheduled appt per 7/31 los.  Spoke with patient and she is aware of her appt date and time.

## 2019-02-21 MED FILL — GABAPENTIN 300 MG CAPSULE: 300 | 30 days supply | Qty: 120 | Fill #1

## 2019-02-27 ENCOUNTER — Ambulatory Visit (HOSPITAL_BASED_OUTPATIENT_CLINIC_OR_DEPARTMENT_OTHER)
Admission: RE | Admit: 2019-02-27 | Discharge: 2019-02-27 | Disposition: A | Discharge status: Home / Self Care | Payer: 59 | Source: Ambulatory Visit | Attending: Hematology | Admitting: Hematology

## 2019-02-27 ENCOUNTER — Other Ambulatory Visit (HOSPITAL_COMMUNITY): Payer: 59

## 2019-02-27 ENCOUNTER — Other Ambulatory Visit: Payer: Self-pay

## 2019-02-27 ENCOUNTER — Ambulatory Visit (HOSPITAL_BASED_OUTPATIENT_CLINIC_OR_DEPARTMENT_OTHER)
Admission: RE | Admit: 2019-02-27 | Discharge: 2019-02-27 | Disposition: A | Discharge status: Home / Self Care | Payer: 59 | Source: Ambulatory Visit

## 2019-02-27 ENCOUNTER — Ambulatory Visit (HOSPITAL_COMMUNITY)
Admission: RE | Admit: 2019-02-27 | Discharge: 2019-02-27 | Disposition: A | Discharge status: Home / Self Care | Payer: 59 | Source: Ambulatory Visit | Attending: Internal Medicine | Admitting: Internal Medicine

## 2019-02-27 DIAGNOSIS — Z79811 Long term (current) use of aromatase inhibitors: Secondary | ICD-10-CM | POA: Insufficient documentation

## 2019-02-27 DIAGNOSIS — C50911 Malignant neoplasm of unspecified site of right female breast: Secondary | ICD-10-CM

## 2019-02-27 DIAGNOSIS — C787 Secondary malignant neoplasm of liver and intrahepatic bile duct: Secondary | ICD-10-CM | POA: Diagnosis not present

## 2019-02-27 DIAGNOSIS — T451X5A Adverse effect of antineoplastic and immunosuppressive drugs, initial encounter: Secondary | ICD-10-CM | POA: Diagnosis not present

## 2019-02-27 DIAGNOSIS — I10 Essential (primary) hypertension: Secondary | ICD-10-CM | POA: Diagnosis not present

## 2019-02-27 DIAGNOSIS — K219 Gastro-esophageal reflux disease without esophagitis: Secondary | ICD-10-CM | POA: Insufficient documentation

## 2019-02-27 DIAGNOSIS — C50919 Malignant neoplasm of unspecified site of unspecified female breast: Secondary | ICD-10-CM | POA: Diagnosis not present

## 2019-02-27 DIAGNOSIS — Z170 Estrogen receptor positive status [ER+]: Secondary | ICD-10-CM | POA: Insufficient documentation

## 2019-02-27 DIAGNOSIS — I427 Cardiomyopathy due to drug and external agent: Secondary | ICD-10-CM | POA: Diagnosis not present

## 2019-02-27 DIAGNOSIS — Z7901 Long term (current) use of anticoagulants: Secondary | ICD-10-CM | POA: Insufficient documentation

## 2019-02-27 DIAGNOSIS — Z79899 Other long term (current) drug therapy: Secondary | ICD-10-CM | POA: Insufficient documentation

## 2019-02-27 MED ORDER — CARVEDILOL 3.125 MG PO TABS: 3.1250 mg | ORAL_TABLET | Freq: Two times a day (BID) | ORAL | 3 refills | Status: DC

## 2019-02-27 MED ORDER — LOSARTAN POTASSIUM 25 MG PO TABS: 25.0000 mg | ORAL_TABLET | Freq: Every day | ORAL | 3 refills | Status: DC

## 2019-02-27 NOTE — Addendum Note (Signed)
Encounter addended by: Jolaine Artist, MD on: 02/27/2019 4:19 PM  Actions taken: Clinical Note Signed

## 2019-02-27 NOTE — Progress Notes (Signed)
Right lower extremity venous duplex completed. Refer to "CV Proc" under chart review to view preliminary results.  02/27/2019 2:23 PM Maudry Mayhew, MHA, RVT, RDCS, RDMS

## 2019-02-27 NOTE — Progress Notes (Addendum)
   Heart Failure TeleHealth Note  Due to national recommendations of social distancing due to COVID 19, Audio/video telehealth visit is felt to be most appropriate for this patient at this time.  See MyChart message from today for patient consent regarding telehealth for [facility].  Date:  02/27/2019   ID:  Teresa Hays, DOB 05/26/1956, MRN [id number]  Location: Home  Provider location: [facility] Type of Visit: New patient  PCP:  Hazy, Marian, NP  Cardiologist:  No primary care provider on file.  Referring: Dr. Feng  Chief Complaint: Cardio-Oncology Consultation    History of Present Illness:  Teresa Hays is a 62 y/o woman with GERD, HTN and metastatic breast CA referred b Dr. Feng for enrollment into the Cardio-Oncology program.   SUMMARY OF ONCOLOGIC HISTORY:     Oncology History   Cancer Staging Metastatic breast cancer (HCC) Staging form: Breast, AJCC 8th Edition - Clinical stage from 03/24/2018: Stage IV (cT2, cN1, pM1, G3, ER+, PR+, HER2+) - Signed by Feng, Yan, MD on 03/30/2018       Metastatic breast cancer (HCC)   01/30/2018 Procedure    Colonoscopy showed small polyp in the sigmoid colon, removed, the exam of colon including the terminal ileum was otherwise negative.    01/30/2018 Procedure    EGD by Dr. Mann showed small hiatal hernia, a 8 mm polypoid lesion in the cardia, biopsied.    03/09/2018 Imaging    03/09/2018 US Abdomen IMPRESSION: 1. Mass lesions throughout the liver, consistent with metastatic disease. Liver as a somewhat nodular contour suggesting underlying hepatic cirrhosis. Inhomogeneous echotexture to the liver.  2. Cholelithiasis with mild gallbladder wall thickening. A degree of cholecystitis cannot be excluded by ultrasound.  3. Portions of pancreas obscured by gas. Visualized portions of pancreas appear normal.  4. Small right kidney. Etiology uncertain. This finding potentially  may be indicative of renal artery stenosis. In this regard, question whether patient is hypertensive.    03/15/2018 Imaging    CT CAP with contrast  IMPRESSION: 1. Widespread hepatic metastasis. 2. 2.6 cm lateral right breast soft tissue nodule could represent a breast primary or an incidental benign lesion. Consider correlation with mammogram and ultrasound. 3. No definite source of primary malignancy identified within the abdomen or pelvis. There is possible rectosigmoid junction wall thickening. Consider colonoscopy with attention to this area. 4. Distal esophageal wall thickening, suggesting esophagitis.    03/24/2018 Cancer Staging    Staging form: Breast, AJCC 8th Edition - Clinical stage from 03/24/2018: Stage IV (cT2, cN1, pM1, G3, ER+, PR+, HER2+) - Signed by Feng, Yan, MD on 03/30/2018    03/24/2018 Initial Biopsy    Diagnosis 1. 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 Breast, right, needle core biopsy, 9 o'clock, 7cm from nipple - INVASIVE DUCTAL CARCINOMA. -The carcinoma is somewhat morphologically dissimilar from that in part 1. It appears grade III 3. Lymph node, needle/core biopsy, right axillary - METASTATIC CARCINOMA IN 1 OF 1 LYMPH NODE (1/1).    03/24/2018 Receptors her2    Breast biopsy: 1. Estrogen Receptor: 40%, POSITIVE, STRONG-MODERATE STAINING INTENSITY Progesterone Receptor: 70%, POSITIVE, STRONG STAINING INTENSITY Proliferation Marker Ki67: 20% HER 2 equivocal by IHC 2+, POSITIVE by FISH, ratio 2.4 and copy #4.2  2. Estrogen Receptor: 60%, POSITIVE, MODERATE STAINING INTENSITY Progesterone Receptor: 40%, POSITIVE, MODERATE STAINING INTENSITY Proliferation Marker Ki67: 20% HER2 (+) by   IHC 3+    03/24/2018 Initial Diagnosis    Metastatic breast cancer (HCC)    03/27/2018 Pathology Results    Diagnosis Liver, needle/core biopsy, Right -  METASTATIC CARCINOMA TO LIVER, CONSISTENT WITH PATIENTS CLINICAL HISTORY OF PRIMARY BREAST CARCINOMA.  ER 80%+ PR40%+ HER2- (by IFSH, IHC 2+)  Ki67 50%     03/28/2018 Pathology Results    03/28/2018 Surgical Pathology Diagnosis 1. Breast, left, needle core biopsy, 9 o'clock - FIBROCYSTIC CHANGES. - THERE IS NO EVIDENCE OF MALIGNANCY. 2. Breast, left, needle core biopsy, 2 o'clock - FIBROADENOMA. - THERE IS NO EVIDENCE OF MALIGNANCY. - SEE COMMENT.    03/29/2018 Imaging    03/29/2018 Bone Scan IMPRESSION: No scintigraphic evidence of osseous metastatic disease.    03/30/2018 Imaging    Bone scan  IMPRESSION: No scintigraphic evidence of osseous metastatic disease.     04/07/2018 -  Chemotherapy    First line chemo weekly Taxol and herceptin/Perjeta every 3 weeks starting 04/07/18. She developed infusion reaction to taxol and it was discontinued. Added Abraxane on C1D8, 2 weeks on/1 week off.  Abraxne stopped after 10/13/18 due to worsening Neuropathy    06/06/2018 Imaging    CT CAP IMPRESSION: 1. Generally improved appearance, with reduced axillary adenopathy and reduced enhancing component of the hepatic masses, with some of the hepatic mass is moderately smaller than on the prior exam. Reduced size of the right lateral breast mass compared to the prior 03/15/2018 exam. 2. New mild interstitial accentuation in the lungs, significance uncertain. Part of this appearance may be due to lower lung volumes on today's exam. 3. Mild wall thickening in the descending colon and upper rectum suggesting low-grade colitis/inflammation. Prominent stool throughout the colon favors constipation. 4. Other imaging findings of potential clinical significance: Aortic Atherosclerosis (ICD10-I70.0). Mild cardiomegaly. Mild nodularity in the right lower lobe appears stable. Contracted and thick-walled gallbladder.     09/18/2018 Imaging    CT CAP W Contrast 09/18/18  IMPRESSION: 1.  Liver metastases have decreased in size. 2. Right breast mass, mild right axillary lymphadenopathy and scattered tiny right pulmonary nodules are all stable. 3. New mild left supraclavicular and left subpectoral lymphadenopathy, can not exclude progression of metastatic nodal disease. 4. Moderate colorectal stool volume, which may indicate constipation. 5. Aortic Atherosclerosis (ICD10-I70.0).    10/2018 -  Anti-estrogen oral therapy    Letrozole 2.5 mg daily starting 10/2018     She presents via audio/video conferencing for a telehealth visit today due to COVID pandemic. She denies any known h/o heart disease. She has underwent first-line weekly TaxolwithHerceptin/Perjeta every 3 weeks starting 04/07/18. She developed infusion reaction to taxol and it was discontinued. Changed to Abraxaneweekly2 weeks on/1 week offfrom 07/21/2018.Abraxane held after October 13, 2018 due to his worsening neuropathy. Now on Letrozole 2.5 mg daily starting 10/2018. Remains on Herceptin/Perjeta. At last visit, I was concerned that EF had dropped slightly so we brought her back for 2 month echo.  Denies SOB or other HF symptoms   Echo today EF 40-45% GLS -21.4%   Echo 07/28/18 EF 60-65% GLS -19.2% Echo 12/1018  EF 55-60% GLS -16.1%    Teresa Hays denies symptoms worrisome for COVID 19.   Past Medical History:  Diagnosis Date  . GERD (gastroesophageal reflux disease)   . Hypertension   . rt breast ca with mets to liver dx'd 03/2018   Past Surgical History:  Procedure Laterality Date  . COLONOSCOPY    . ESOPHAGOGASTRODUODENOSCOPY ENDOSCOPY    .   IR IMAGING GUIDED PORT INSERTION  04/04/2018     Current Outpatient Medications  Medication Sig Dispense Refill  . albuterol (VENTOLIN HFA) 108 (90 Base) MCG/ACT inhaler Inhale 2 puffs into the lungs every 6 (six) hours as needed for wheezing or shortness of breath. 6.7 g 1  . amLODipine (NORVASC) 5 MG tablet Take 5 mg by mouth daily.     Marland Kitchen  atenolol (TENORMIN) 50 MG tablet Take 50 mg by mouth daily.     . chlorpheniramine-HYDROcodone (TUSSIONEX) 10-8 MG/5ML SUER Take 5 mLs by mouth at bedtime as needed for cough. 115 mL 0  . diphenoxylate-atropine (LOMOTIL) 2.5-0.025 MG tablet Take 2 tablets by mouth 4 (four) times daily as needed for diarrhea or loose stools. 30 tablet 0  . fluticasone (FLONASE) 50 MCG/ACT nasal spray Place 2 sprays into both nostrils 2 (two) times daily as needed for allergies or rhinitis. 16 g 0  . furosemide (LASIX) 20 MG tablet Take 1 tablet (20 mg total) by mouth daily. 30 tablet 0  . gabapentin (NEURONTIN) 300 MG capsule Take 1 capsule (300 mg total) by mouth 3 (three) times daily. Take 2 capsules at night 120 capsule 3  . letrozole (FEMARA) 2.5 MG tablet Take 1 tablet (2.5 mg total) by mouth daily. 90 tablet 1  . lidocaine-prilocaine (EMLA) cream Apply to affected area once 30 g 3  . LORazepam (ATIVAN) 0.5 MG tablet Take 1 tablet (0.5 mg total) by mouth at bedtime as needed for anxiety. 30 tablet 0  . omeprazole (PRILOSEC) 20 MG capsule TAKE 1 CAPSULE BY MOUTH ONCE DAILY 30 capsule 2  . ondansetron (ZOFRAN) 8 MG tablet Take 1 tablet (8 mg total) by mouth 2 (two) times daily as needed (Nausea or vomiting). 30 tablet 1  . potassium chloride SA (K-DUR) 20 MEQ tablet TAKE 1 TABLET BY MOUTH ONCE DAILY 30 tablet 0  . rivaroxaban (XARELTO) 20 MG TABS tablet Take 1 tablet (20 mg total) by mouth daily with supper. 30 tablet 5  . traMADol (ULTRAM) 50 MG tablet Take 1 tablet (50 mg total) by mouth every 6 (six) hours as needed for moderate pain or severe pain. 30 tablet 0   No current facility-administered medications for this encounter.     Allergies:   Patient has no known allergies.   Social History:  The patient  reports that she has never smoked. She has never used smokeless tobacco. She reports that she does not drink alcohol or use drugs.   Family History:  The patient's family history includes Cancer (age of  onset: 34) in her sister; Diabetes in her mother; Rheum arthritis in her sister.   ROS:  Please see the history of present illness.   All other systems are personally reviewed and negative.    Recent BP 130-150/80s HR 58-70  Exam:  (Video/Tele Health Call; Exam is subjective and or/visual.) General:  Speaks in full sentences. No resp difficulty. Lungs: Normal respiratory effort with conversation.  Abdomen: Non-distended per patient report Extremities: Pt denies edema. Neuro: Alert & oriented x 3.   Recent Labs: 02/16/2019: ALT 20; BUN 17; Creatinine 1.03; Hemoglobin 10.8; Platelet Count 199; Potassium 3.8; Sodium 140  Personally reviewed   Wt Readings from Last 3 Encounters:  02/16/19 63.3 kg (139 lb 8 oz)  01/26/19 63.1 kg (139 lb 1.6 oz)  01/05/19 62.3 kg (137 lb 4.8 oz)      ASSESSMENT AND PLAN:  1. Breast cancer, metastatic - She has underwent first-line weekly TaxolwithHerceptin/Perjeta  every 3 weeks starting 04/07/18. She developed infusion reaction to taxol and it was discontinued. Changed to Abraxaneweekly2 weeks on/1 week offfrom 07/21/2018.Abraxane held after October 13, 2018 due to his worsening neuropathy. Remains on Herceptin/Perjeta. Now on Letrozole 2.5 mg daily starting 10/2018 - I reviewed echos personally. EF down to 40-45% range suspicious for Herceptin cardiotoxicity.  - Will d/w Dr. Feng. Recommend stopping Herceptin/perjeta for now - Start carvedilol 3.125 bid and losartan 25 daily - Repeat echo 2 months.  - Addendum: I reviewed echo with Dr. Mclean and have reassessed EF at 50%    COVID screen The patient does not have any symptoms that suggest any further testing/ screening at this time.  Social distancing reinforced today.  Recommended follow-up:  As above  Relevant cardiac medications were reviewed at length with the patient today.   The patient does not have concerns regarding their medications at this time.   The following changes were made  today:  As above  Today, I have spent 21 minutes with the patient with telehealth technology discussing the above issues .    Signed,  , MD  02/27/2019 3:34 PM  [facility] [address] [facility] [place] [address] [phone] (office) [phone] (fax)   

## 2019-02-27 NOTE — Patient Instructions (Addendum)
START Carvedilol 3.125mg  (1 tab) twice a day.  START Losartan 25mg   (1 tab) daily  Your physician has requested that you have an echocardiogram. Echocardiography is a painless test that uses sound waves to create images of your heart. It provides your doctor with information about the size and shape of your heart and how well your heart's chambers and valves are working. This procedure takes approximately one hour. There are no restrictions for this procedure.  You will get a call to schedule this appointment.   Your physician recommends that you schedule a follow-up appointment in: 2 months with the Echo   At the [facility], you and your health needs are our priority. As part of our continuing mission to provide you with exceptional heart care, we have created designated Provider Care Teams. These Care Teams include your primary Cardiologist (physician) and Advanced Practice Providers (APPs- Physician Assistants and Nurse Practitioners) who all work together to provide you with the care you need, when you need it.   You may see any of the following providers on your designated Care Team at your next follow up: Marland Kitchen Dr Glori Bickers . Dr Loralie Champagne . Darrick Grinder, NP   Please be sure to bring in all your medications bottles to every appointment.

## 2019-02-27 NOTE — Addendum Note (Signed)
Encounter addended by: Valeda Malm, RN on: 02/27/2019 4:28 PM  Actions taken: Clinical Note Signed, Pharmacy for encounter modified, Order list changed, Diagnosis association updated

## 2019-02-27 NOTE — Progress Notes (Signed)
  Echocardiogram 2D Echocardiogram has been performed.  Teresa Hays 02/27/2019, 1:47 PM

## 2019-02-27 NOTE — Addendum Note (Signed)
Encounter addended by: Valeda Malm, RN on: 02/27/2019 4:24 PM  Actions taken: Clinical Note Signed

## 2019-02-28 ENCOUNTER — Telehealth (HOSPITAL_COMMUNITY): Payer: Self-pay

## 2019-02-28 MED ORDER — CARVEDILOL 3.125 MG PO TABS: 3.1250 mg | ORAL_TABLET | Freq: Two times a day (BID) | ORAL | 3 refills | Status: DC

## 2019-02-28 MED ORDER — LOSARTAN POTASSIUM 25 MG PO TABS: 25.0000 mg | ORAL_TABLET | Freq: Every day | ORAL | 3 refills | Status: DC

## 2019-02-28 MED FILL — CARVEDILOL 3.125 MG TABLET: 3.125 | 90 days supply | Qty: 180 | Fill #0

## 2019-02-28 MED FILL — LOSARTAN POTASSIUM 25 MG TA: 25 | 30 days supply | Qty: 30 | Fill #0

## 2019-02-28 NOTE — Telephone Encounter (Signed)
Called patient to discuss AVS from yesterdays virtual visit. Pt advised of med additions. Scripts sent to pharmacy of choice. Pt to be called to schedule echo and f/u visit. Pt verbalized understanding.  AVS mailed  AVS:  Losartan 25mg  daily  Coreg 3.125mg  twice a day  Echo and f/u 2 months

## 2019-02-28 NOTE — Addendum Note (Signed)
Encounter addended by: Valeda Malm, RN on: 02/28/2019 9:15 AM  Actions taken: Order list changed, Diagnosis association updated, Clinical Note Signed

## 2019-02-28 NOTE — Addendum Note (Signed)
Encounter addended by: Valeda Malm, RN on: 02/28/2019 9:31 AM  Actions taken: Pharmacy for encounter modified, Order list changed

## 2019-03-01 ENCOUNTER — Other Ambulatory Visit (HOSPITAL_COMMUNITY): Payer: Self-pay | Admitting: *Deleted

## 2019-03-05 ENCOUNTER — Telehealth: Payer: Self-pay | Admitting: *Deleted

## 2019-03-05 NOTE — Telephone Encounter (Signed)
Dr. Haroldine Laws,   I read her recent echo and your note. Although the second reading of her recent echo showed EF 50%, you still recommend her to hold off Herceptin/Perjeta for 2 months, right? She has metastatic disease, so ideally not off HER-2 antibody for long time, maybe I should consider other HER2 antibody with less cardiac toxicity. Let me know you thoughts. Thanks   [name] MD

## 2019-03-05 NOTE — Telephone Encounter (Signed)
Received vm call from pt asking if 03/09/19 appts have been cancelled.  She states that on MyChart they look cancelled.  Reviewed chart & informed pt that appts are still there.  She states she had an ECHO & ejection Fx was 50 %.  Dr Suezanne Jacquet discussed with her.  He said he didn't know if Dr Burr Medico would want to hold herceptin.  Message to Dr Burr Medico.

## 2019-03-05 NOTE — Telephone Encounter (Signed)
Ok, I will cancel her treatment on 8/21, and will see her back in 9/18 and restart HP if her repeated echo is better. I will let her know. Thanks   [name] MD

## 2019-03-05 NOTE — Telephone Encounter (Signed)
I would hold for at least one month if at all possible.  However, given metastatic disease, I would be ok with continuing therapy and hoping that losartan and carvedilol will protect her. We will then repeat echo in 1 month. Shouldn't change drastically.

## 2019-03-06 ENCOUNTER — Telehealth: Payer: Self-pay | Admitting: Hematology

## 2019-03-06 NOTE — Telephone Encounter (Signed)
Scheduled appt per 8/17 sch message- pt aware of appts cancelled and new appt date .

## 2019-03-06 NOTE — Telephone Encounter (Signed)
Echo and f/u appt w/Dr Bensimhon scheduled for 04/02/2019, pt aware

## 2019-03-07 ENCOUNTER — Telehealth: Payer: Self-pay | Admitting: *Deleted

## 2019-03-07 ENCOUNTER — Telehealth: Payer: Self-pay

## 2019-03-07 NOTE — Telephone Encounter (Signed)
Spoke with patient advised her that Dr. Burr Medico is recommending she not go to the [facility] in [place], recommends finding a place less crowded to enjoy.  Patient verbalized an understanding.

## 2019-03-07 NOTE — Telephone Encounter (Signed)
Spoke with Teresa Hays this am to make sure she had received all the messages about her treatments & appts & she reports that she did. She wanted to ask Dr Burr Medico about how she felt about her traveling to [place] to the [place].  Informed that message would be sent.

## 2019-03-07 NOTE — Telephone Encounter (Signed)
Due to current COVID pandemic, I will suggest her to avoid going to place like [facility]. Hope she can find somewhere else with less crowed, open space to enjoy her time.  [name] MD

## 2019-03-09 ENCOUNTER — Inpatient Hospital Stay: Payer: 59

## 2019-03-09 ENCOUNTER — Other Ambulatory Visit: Payer: 59

## 2019-03-09 ENCOUNTER — Ambulatory Visit: Payer: 59

## 2019-03-12 NOTE — Progress Notes (Unsigned)
The following biosimilar Ogivri (trastuzumab-[id number]) has been selected for use in this patient.  Demetrius Charity, PharmD, [facility] Pharmacist Pharmacy Phone: [phone] 03/12/2019

## 2019-03-13 ENCOUNTER — Other Ambulatory Visit: Payer: Self-pay | Admitting: Nurse Practitioner

## 2019-03-13 ENCOUNTER — Other Ambulatory Visit: Payer: Self-pay | Admitting: Hematology

## 2019-03-13 DIAGNOSIS — F419 Anxiety disorder, unspecified: Secondary | ICD-10-CM

## 2019-03-13 MED ORDER — LORAZEPAM 0.5 MG PO TABS: 0.5000 mg | ORAL_TABLET | Freq: Every evening | ORAL | 0 refills | Status: DC | PRN

## 2019-03-13 MED FILL — ALBUTEROL SULFATE HFA 108 (: 108 (90 BAS | 25 days supply | Qty: 18 | Fill #1

## 2019-03-13 MED FILL — LORazepam 0.5 MG TABS: 0.5 | 30 days supply | Qty: 30 | Fill #0

## 2019-03-13 NOTE — Telephone Encounter (Signed)
Given to [facility] to review.

## 2019-03-14 NOTE — Telephone Encounter (Signed)
Requests  refilll

## 2019-03-19 MED FILL — OMEPRAZOLE 20 MG CAP: 20 | 30 days supply | Qty: 30 | Fill #2

## 2019-03-19 MED FILL — XARELTO 20 MG TABLET: 20 | 30 days supply | Qty: 30 | Fill #4

## 2019-03-19 MED FILL — GABAPENTIN 300 MG CAPSULE: 300 | 30 days supply | Qty: 120 | Fill #2

## 2019-03-20 ENCOUNTER — Other Ambulatory Visit: Payer: Self-pay | Admitting: Hematology

## 2019-03-21 ENCOUNTER — Other Ambulatory Visit: Payer: Self-pay | Admitting: Nurse Practitioner

## 2019-03-21 MED ORDER — TRAMADOL HCL 50 MG PO TABS: 50.0000 mg | ORAL_TABLET | Freq: Four times a day (QID) | ORAL | 0 refills | Status: DC | PRN

## 2019-03-21 MED FILL — traMADol HCL 50 MG TABS: 50 | 7 days supply | Qty: 30 | Fill #0

## 2019-03-27 MED FILL — LOSARTAN POTASSIUM 25 MG TA: 25 | 30 days supply | Qty: 30 | Fill #1

## 2019-03-30 ENCOUNTER — Other Ambulatory Visit: Payer: 59

## 2019-03-30 ENCOUNTER — Ambulatory Visit: Payer: 59

## 2019-03-30 ENCOUNTER — Ambulatory Visit: Payer: 59 | Admitting: Hematology

## 2019-04-02 ENCOUNTER — Other Ambulatory Visit: Payer: Self-pay

## 2019-04-02 ENCOUNTER — Other Ambulatory Visit (HOSPITAL_COMMUNITY): Payer: Self-pay | Admitting: *Deleted

## 2019-04-02 ENCOUNTER — Ambulatory Visit (HOSPITAL_COMMUNITY)
Admission: RE | Admit: 2019-04-02 | Discharge: 2019-04-02 | Disposition: A | Discharge status: Home / Self Care | Payer: 59 | Source: Ambulatory Visit | Attending: Family Medicine | Admitting: Family Medicine

## 2019-04-02 ENCOUNTER — Ambulatory Visit (HOSPITAL_BASED_OUTPATIENT_CLINIC_OR_DEPARTMENT_OTHER)
Admission: RE | Admit: 2019-04-02 | Discharge: 2019-04-02 | Disposition: A | Discharge status: Home / Self Care | Payer: 59 | Source: Ambulatory Visit | Attending: Internal Medicine | Admitting: Internal Medicine

## 2019-04-02 VITALS — BP 158/82 | HR 58 | Wt 141.4 lb

## 2019-04-02 DIAGNOSIS — Z809 Family history of malignant neoplasm, unspecified: Secondary | ICD-10-CM | POA: Diagnosis not present

## 2019-04-02 DIAGNOSIS — K219 Gastro-esophageal reflux disease without esophagitis: Secondary | ICD-10-CM | POA: Insufficient documentation

## 2019-04-02 DIAGNOSIS — I82621 Acute embolism and thrombosis of deep veins of right upper extremity: Secondary | ICD-10-CM | POA: Insufficient documentation

## 2019-04-02 DIAGNOSIS — I10 Essential (primary) hypertension: Secondary | ICD-10-CM

## 2019-04-02 DIAGNOSIS — C50411 Malignant neoplasm of upper-outer quadrant of right female breast: Secondary | ICD-10-CM

## 2019-04-02 DIAGNOSIS — Z79899 Other long term (current) drug therapy: Secondary | ICD-10-CM | POA: Insufficient documentation

## 2019-04-02 DIAGNOSIS — Z79811 Long term (current) use of aromatase inhibitors: Secondary | ICD-10-CM | POA: Diagnosis not present

## 2019-04-02 DIAGNOSIS — Z7901 Long term (current) use of anticoagulants: Secondary | ICD-10-CM | POA: Diagnosis not present

## 2019-04-02 DIAGNOSIS — Z833 Family history of diabetes mellitus: Secondary | ICD-10-CM | POA: Diagnosis not present

## 2019-04-02 DIAGNOSIS — C787 Secondary malignant neoplasm of liver and intrahepatic bile duct: Secondary | ICD-10-CM | POA: Insufficient documentation

## 2019-04-02 DIAGNOSIS — C7981 Secondary malignant neoplasm of breast: Secondary | ICD-10-CM | POA: Diagnosis not present

## 2019-04-02 DIAGNOSIS — C50919 Malignant neoplasm of unspecified site of unspecified female breast: Secondary | ICD-10-CM

## 2019-04-02 DIAGNOSIS — Z170 Estrogen receptor positive status [ER+]: Secondary | ICD-10-CM | POA: Diagnosis not present

## 2019-04-02 MED ORDER — LOSARTAN POTASSIUM 50 MG PO TABS: 50.0000 mg | ORAL_TABLET | Freq: Every day | ORAL | 3 refills | Status: DC

## 2019-04-02 MED FILL — LOSARTAN POTASSIUM 50 MG TA: 50 | 30 days supply | Qty: 30 | Fill #0

## 2019-04-02 NOTE — Addendum Note (Signed)
Encounter addended by: Jolaine Artist, MD on: 04/02/2019 4:47 PM  Actions taken: Clinical Note Signed

## 2019-04-02 NOTE — Progress Notes (Signed)
*  PRELIMINARY RESULTS* Echocardiogram 2D Echocardiogram has been performed.  Teresa Hays 04/02/2019, 3:14 PM

## 2019-04-02 NOTE — Patient Instructions (Signed)
INCREASE Losartan to 50mg  daily.   Your physician has requested that you have an echocardiogram. Echocardiography is a painless test that uses sound waves to create images of your heart. It provides your doctor with information about the size and shape of your heart and how well your heart's chambers and valves are working. This procedure takes approximately one hour. There are no restrictions for this procedure. This will be done at your follow up appointment.  Please follow up with the [facility] in 2 months with an echocardiogram.  At the [facility], you and your health needs are our priority. As part of our continuing mission to provide you with exceptional heart care, we have created designated Provider Care Teams. These Care Teams include your primary Cardiologist (physician) and Advanced Practice Providers (APPs- Physician Assistants and Nurse Practitioners) who all work together to provide you with the care you need, when you need it.   You may see any of the following providers on your designated Care Team at your next follow up: Marland Kitchen Dr Glori Bickers . Dr Loralie Champagne . Darrick Grinder, NP   Please be sure to bring in all your medications bottles to every appointment.

## 2019-04-02 NOTE — Progress Notes (Signed)
[facility]   [phone] [phone]   Clinic Follow up Note   Patient Care Team: Levin Bacon, NP as PCP - General (Family Medicine) [name], MD as Consulting Physician (Gastroenterology) [name], MD as Consulting Physician (Hematology)  Date of Service:  04/06/2019  CHIEF COMPLAINT: F/u for metastatic breast cancer  SUMMARY OF ONCOLOGIC HISTORY: Oncology History Overview Note  Cancer Staging Metastatic breast cancer [facility]) Staging form: Breast, AJCC 8th Edition - Clinical stage from 03/24/2018: Stage IV (cT2, cN1, pM1, G3, ER+, PR+, HER2+) - Signed by [name], MD on 03/30/2018     Metastatic breast cancer ([place])  01/30/2018 Procedure   Colonoscopy showed small polyp in the sigmoid colon, removed, the exam of colon including the terminal ileum was otherwise negative.   01/30/2018 Procedure   EGD by Dr. Collene Mares showed small hiatal hernia, a 8 mm polypoid lesion in the cardia, biopsied.   03/09/2018 Imaging   03/09/2018 US Abdomen IMPRESSION: 1. Mass lesions throughout the liver, consistent with metastatic disease. Liver as a somewhat nodular contour suggesting underlying hepatic cirrhosis. Inhomogeneous echotexture to the liver.  2. Cholelithiasis with mild gallbladder wall thickening. A degree of cholecystitis cannot be excluded by ultrasound.  3. Portions of pancreas obscured by gas. Visualized portions of pancreas appear normal.  4. Small right kidney. Etiology uncertain. This finding potentially may be indicative of renal artery stenosis. In this regard, question whether patient is hypertensive.   03/15/2018 Imaging   CT CAP with contrast  IMPRESSION: 1. Widespread hepatic metastasis. 2. 2.6 cm lateral right breast soft tissue nodule could represent a breast primary or an incidental benign lesion. Consider correlation with mammogram and ultrasound. 3. No definite source of primary malignancy identified within the abdomen or  pelvis. There is possible rectosigmoid junction wall thickening. Consider colonoscopy with attention to this area. 4. Distal esophageal wall thickening, suggesting esophagitis.   03/24/2018 Cancer Staging   Staging form: Breast, AJCC 8th Edition - Clinical stage from 03/24/2018: Stage IV (cT2, cN1, pM1, G3, ER+, PR+, HER2+) - Signed by [name], MD on 03/30/2018   03/24/2018 Initial Biopsy   Diagnosis 1. 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 Breast, right, needle core biopsy, 9 o'clock, 7cm from nipple - INVASIVE DUCTAL CARCINOMA. -The carcinoma is somewhat morphologically dissimilar from that in part 1. It appears grade III 3. Lymph node, needle/core biopsy, right axillary - METASTATIC CARCINOMA IN 1 OF 1 LYMPH NODE (1/1).   03/24/2018 Receptors her2   Breast biopsy: 1. Estrogen Receptor: 40%, POSITIVE, STRONG-MODERATE STAINING INTENSITY Progesterone Receptor: 70%, POSITIVE, STRONG STAINING INTENSITY Proliferation Marker Ki67: 20% HER 2 equivocal by IHC 2+, POSITIVE by FISH, ratio 2.4 and copy #4.2  2. Estrogen Receptor: 60%, POSITIVE, MODERATE STAINING INTENSITY Progesterone Receptor: 40%, POSITIVE, MODERATE STAINING INTENSITY Proliferation Marker Ki67: 20% HER2 (+) by IHC 3+   03/24/2018 Initial Diagnosis   Metastatic breast cancer ([place])   03/27/2018 Pathology Results   Diagnosis Liver, needle/core biopsy, Right - METASTATIC CARCINOMA TO LIVER, CONSISTENT WITH PATIENTS CLINICAL HISTORY OF PRIMARY BREAST CARCINOMA.  ER 80%+ PR40%+ HER2- (by [facility], IHC 2+)  Ki67 50%    03/28/2018 Pathology Results   03/28/2018 Surgical Pathology Diagnosis 1. Breast, left, needle core biopsy, 9 o'clock - FIBROCYSTIC CHANGES. - THERE IS NO EVIDENCE OF MALIGNANCY. 2. Breast, left, needle core biopsy, 2 o'clock - FIBROADENOMA. - THERE IS NO EVIDENCE OF  MALIGNANCY. - SEE COMMENT.   03/29/2018 Imaging    03/29/2018 Bone Scan IMPRESSION: No scintigraphic evidence of osseous metastatic disease.   03/30/2018 Imaging   Bone scan  IMPRESSION: No scintigraphic evidence of osseous metastatic disease.    04/07/2018 -  Chemotherapy   First line chemo weekly Taxol and herceptin/Perjeta every 3 weeks starting 04/07/18. She developed infusion reaction to taxol and it was discontinued. Added Abraxane on C1D8, 2 weeks on/1 week off.  Abraxne stopped after 10/13/18 due to worsening Neuropathy   06/06/2018 Imaging   CT CAP IMPRESSION: 1. Generally improved appearance, with reduced axillary adenopathy and reduced enhancing component of the hepatic masses, with some of the hepatic mass is moderately smaller than on the prior exam. Reduced size of the right lateral breast mass compared to the prior 03/15/2018 exam. 2. New mild interstitial accentuation in the lungs, significance uncertain. Part of this appearance may be due to lower lung volumes on today's exam. 3. Mild wall thickening in the descending colon and upper rectum suggesting low-grade colitis/inflammation. Prominent stool throughout the colon favors constipation. 4. Other imaging findings of potential clinical significance: Aortic Atherosclerosis (ICD10-I70.0). Mild cardiomegaly. Mild nodularity in the right lower lobe appears stable. Contracted and thick-walled gallbladder.    09/18/2018 Imaging   CT CAP W Contrast 09/18/18  IMPRESSION: 1. Liver metastases have decreased in size. 2. Right breast mass, mild right axillary lymphadenopathy and scattered tiny right pulmonary nodules are all stable. 3. New mild left supraclavicular and left subpectoral lymphadenopathy, can not exclude progression of metastatic nodal disease. 4. Moderate colorectal stool volume, which may indicate constipation. 5.  Aortic Atherosclerosis (ICD10-I70.0).   10/2018 -  Anti-estrogen oral therapy   Letrozole 2.5 mg daily starting 10/2018   01/10/2019 Imaging   CT CAP W  Contrast 01/10/19  IMPRESSION: 1. Continued improvement in the hepatic metastatic lesions which have reduced in size. 2. Stable mild left supraclavicular and subpectoral adenopathy. 3. Essentially stable small right lower lobe pulmonary nodule and separate small subpleural nodule along the right hemidiaphragm. Surveillance suggested. 4. Other imaging findings of potential clinical significance: Mild cardiomegaly. Mild circumferential distal esophageal wall thickening, the most common cause would be esophagitis. Airway thickening is present, suggesting bronchitis or reactive airways disease. Airway plugging in the lower lobes and in the right middle lobe. Stable small right adrenal adenoma.      CURRENT THERAPY:  -First line weekly TaxolwithHerceptin/Perjeta every 3 weeks starting 04/07/18. She developed infusion reaction to taxol and it was discontinued. Changed to Abraxaneweekly2 weeks on/1 week offfrom 07/21/2018.Abraxane held after October 13, 2018 due to his worsening neuropathy. -Letrozole 2.5 mg daily starting 10/2018  INTERVAL HISTORY:  Teresa Hays is here for a follow up and treatment. She presents to the clinic alone. She notes her neuropathy remains significant and does not feel it will get better. She feels her neuropathy has also progressed on her hands as well. The neuropathy of her feet is mainly at night with burning sensation and is mild or normal during the day when walking. She has been taking '300mg'$  in the morning and '600mg'$  at night and Tramadol. She denies pain elsewhere and notes her appetite is doing well. She has normal bowel movements. She notes she is tolerating Letrozole. She notes pain in her right shoulder for a few weeks which is new. She notes having limited ROM from the stiffness. She notes she has been stretching it.    REVIEW OF SYSTEMS:   Constitutional: Denies fevers, chills or  abnormal weight loss Eyes: Denies blurriness of vision Ears, nose,  mouth, throat, and face: Denies mucositis or sore throat Respiratory: Denies cough, dyspnea or wheezes Cardiovascular: Denies palpitation, chest discomfort or lower extremity swelling Gastrointestinal:  Denies nausea, heartburn or change in bowel habits Skin: Denies abnormal skin rashes MSK:(+) right posterior shoulder pain with limited ROM Lymphatics: Denies new lymphadenopathy or easy bruising Neurological: (+) neuropathy of hands and feet Behavioral/Psych: Mood is stable, no new changes  All other systems were reviewed with the patient and are negative.  MEDICAL HISTORY:  Past Medical History:  Diagnosis Date   GERD (gastroesophageal reflux disease)    Hypertension    rt breast ca with mets to liver dx'd 03/2018    SURGICAL HISTORY: Past Surgical History:  Procedure Laterality Date   COLONOSCOPY     ESOPHAGOGASTRODUODENOSCOPY ENDOSCOPY     IR IMAGING GUIDED PORT INSERTION  04/04/2018    I have reviewed the social history and family history with the patient and they are unchanged from previous note.  ALLERGIES:  has No Known Allergies.  MEDICATIONS:  Current Outpatient Medications  Medication Sig Dispense Refill   albuterol (VENTOLIN HFA) 108 (90 Base) MCG/ACT inhaler Inhale 2 puffs into the lungs every 6 (six) hours as needed for wheezing or shortness of breath. 6.7 g 1   amLODipine (NORVASC) 5 MG tablet Take 5 mg by mouth daily.      carvedilol (COREG) 3.125 MG tablet Take 1 tablet (3.125 mg total) by mouth 2 (two) times daily. 180 tablet 3   chlorpheniramine-HYDROcodone (TUSSIONEX) 10-8 MG/5ML SUER Take 5 mLs by mouth at bedtime as needed for cough. 115 mL 0   diphenoxylate-atropine (LOMOTIL) 2.5-0.025 MG tablet Take 2 tablets by mouth 4 (four) times daily as needed for diarrhea or loose stools. 30 tablet 0   fluticasone (FLONASE) 50 MCG/ACT nasal spray Place 2 sprays into both nostrils 2 (two) times daily as needed for allergies or rhinitis. 16 g 0    gabapentin (NEURONTIN) 300 MG capsule Take 1 capsule (300 mg total) by mouth 3 (three) times daily. Take 2 capsules at night 120 capsule 3   letrozole (FEMARA) 2.5 MG tablet Take 1 tablet (2.5 mg total) by mouth daily. 90 tablet 1   LORazepam (ATIVAN) 0.5 MG tablet Take 1 tablet (0.5 mg total) by mouth at bedtime as needed for anxiety. 30 tablet 0   losartan (COZAAR) 50 MG tablet Take 1 tablet (50 mg total) by mouth daily. 90 tablet 3   omeprazole (PRILOSEC) 20 MG capsule TAKE 1 CAPSULE BY MOUTH ONCE DAILY 30 capsule 2   ondansetron (ZOFRAN) 8 MG tablet Take 1 tablet (8 mg total) by mouth 2 (two) times daily as needed (Nausea or vomiting). 30 tablet 1   potassium chloride SA (K-DUR) 20 MEQ tablet Take 20 mEq by mouth every other day.     rivaroxaban (XARELTO) 20 MG TABS tablet Take 1 tablet (20 mg total) by mouth daily with supper. 30 tablet 5   traMADol (ULTRAM) 50 MG tablet Take 1 tablet (50 mg total) by mouth every 6 (six) hours as needed for moderate pain or severe pain. 30 tablet 0   lidocaine-prilocaine (EMLA) cream Apply to affected area once 30 g 3   Current Facility-Administered Medications  Medication Dose Route Frequency Provider Last Rate Last Dose   influenza vac split quadrivalent PF (FLUARIX) injection 0.5 mL  0.5 mL Intramuscular Once [name], MD        PHYSICAL EXAMINATION: ECOG  PERFORMANCE STATUS: 1 - Symptomatic but completely ambulatory  Vitals:   04/06/19 1032  BP: (!) 155/79  Pulse: 64  Resp: 20  Temp: 98.5 F (36.9 C)  SpO2: 100%   Filed Weights   04/06/19 1032  Weight: 138 lb 11.2 oz (62.9 kg)    GENERAL:alert, no distress and comfortable SKIN: skin color, texture, turgor are normal, no rashes or significant lesions EYES: normal, Conjunctiva are pink and non-injected, sclera clear  NECK: supple, thyroid normal size, non-tender, without nodularity LYMPH:  no palpable lymphadenopathy in the cervical, axillary  LUNGS: clear to auscultation and  percussion with normal breathing effort HEART: regular rate & rhythm and no murmurs and no lower extremity edema ABDOMEN:abdomen soft, non-tender and normal bowel sounds Musculoskeletal:no cyanosis of digits and no clubbing (+) Right shoulder tenderness NEURO: alert & oriented x 3 with fluent speech, no focal motor/sensory deficits  LABORATORY DATA:  I have reviewed the data as listed CBC Latest Ref Rng & Units 04/06/2019 02/16/2019 01/26/2019  WBC 4.0 - 10.5 K/uL 5.9 6.7 6.0  Hemoglobin 12.0 - 15.0 g/dL 11.1(L) 10.8(L) 10.6(L)  Hematocrit 36.0 - 46.0 % 34.5(L) 33.6(L) 33.7(L)  Platelets 150 - 400 K/uL 213 199 179     CMP Latest Ref Rng & Units 04/06/2019 02/16/2019 01/26/2019  Glucose 70 - 99 mg/dL 104(H) 99 97  BUN 8 - 23 mg/dL '16 17 13  '$ Creatinine 0.44 - 1.00 mg/dL 1.11(H) 1.03(H) 1.12(H)  Sodium 135 - 145 mmol/L 140 140 138  Potassium 3.5 - 5.1 mmol/L 3.9 3.8 3.9  Chloride 98 - 111 mmol/L 105 104 104  CO2 22 - 32 mmol/L '26 29 26  '$ Calcium 8.9 - 10.3 mg/dL 9.6 9.7 9.4  Total Protein 6.5 - 8.1 g/dL 7.4 7.5 7.2  Total Bilirubin 0.3 - 1.2 mg/dL 0.2(L) 0.3 0.4  Alkaline Phos 38 - 126 U/L 119 131(H) 130(H)  AST 15 - 41 U/L '25 29 31  '$ ALT 0 - 44 U/L '17 20 19      '$ RADIOGRAPHIC STUDIES: I have personally reviewed the radiological images as listed and agreed with the findings in the report. No results found.   ASSESSMENT & PLAN:  Teresa Hays is a 63 y.o. female with    1. Metastatic right breast cancer to liver, cT2N1M1, stage IV, ER+/PR+/HER2+, Liver mets ER+/PR+/HER2- -She was diagnosed in 03/2018.She presented with diffuse liver metastasis, tworight breast massesand right axillary adenopathy. Breast mass biopsy showed ER PR positive, but 1 was HER-2 positive, the other one was HER-2 negative. Liver met was HER2-.  -Given the metastatic disease, her cancer is not curable but still treatable. Shehas been treated withFirst-line Herceptin/Perjetaevery 3 weeks and weekly  Taxol. Due to infusionreaction, taxol was switched to Abraxanefor 2 weeks on/1 week off.Tolerating well. We stoppedAbraxaneafter cycle 10 due to worsening neuropathy.  -Her right breast mass is no longer palpable on exam, she continues to show good response. -She has startedanti-estrogen therapy with [facility] April 2020 and is tolerating well.Will continue letrozole daily and Herceptin/Perjeta every 3 weeks for as long as it controls her disease. She is agreeable. -She has experienced worsening neuropathy. Will increase Gabapentin, PT referral and may consider neurology referral. Her physical exam is unremarkable with no hepatomegaly and right LE edema nearly resolved.  She does have right shoulder tenderness. Labs reviewed, CBC and CMP WNL except Hg 11.1, Cr 1.11, CA 27.29 still pending. Overall adequateto proceed with Herceptin and Perjeta today  -Continue Letrozole  -Plan to repeat scan in  October. If she has mild disease progression on next scan, may consider adding CDK4/6 inhibitor  -F/u in 6 weeks with scan.   2.Peripheral neuropathy, grade 2 -Secondarytochemotherapy,mainly in her feet -Ipreviouslystropped Abraxane on 10/13/18. -Her neuropathy in her feet has not improved is very significant at night and mostly normal during the day.  The neuropathy in her hands is more constant now. (04/06/19). Ambulation during the day is mostly adequate.  -I Increased her Gabapentin to '900mg'$  at bed time, she will continue '300mg'$  in am and pm.  -I will refer her PT to help manage her ambulation to help prevent fall. She is agreeable.  -If Gabapentin is not enough I may call in Cymbalta. If still not enough I may refer her to neurologist.  -I will check her B12 level. I encouraged her to take OTC B12 daily.   4.Left UEDVT, Right LE edema -she developed LUE DVT on 08/04/2018, edema has resolved after anticoagulation -Continue onXarelto '20mg'$  indefinitely, unless she has significant  bleeding or other side effects. -Shepreviouslydeveloped LE edema, right significantly more than left.10/13/18 doppler was negative for DVT.  -Ipreviouslyput her on 1 week dose of Lasix and potassium -Her right LE edema is almost resolved on exam today (04/06/19)  5. Anemia -she developed worseninganemia since she started chemotherapy, improved since she came off chemo -Continue monitoring, will consider blood transfusion if hemoglobin less than 8, always symptomatic anemia. -stable, hg at11.1today (04/06/19)   6Social support  -She lives alone, has her sister and niece in [place]. Her daughter lives in a few hours away  -On Ativan PRN due to anxietyabout diagnosis and treatment  7. Goal of care discussion  -she is full code -She understands the goal of care is palliative, to prolong her life, and improve her quality of life.  8. Right shoulder tenderness  -ongoing for the past few weeks with limited ROM. She has been stretching it.  -Tender on exam today (04/06/19). Possible Bursitis.  -I encouraged her to use Ibuprofen and heating pad. If worsens I may call in short course steroids.    Plan: -Labs reviewed and adequate to proceed with Herceptin/Perjeta today -continue letrozole -PT referral for neuropathy in feet, increase gabapentin to 900 mg at night -Lab, flush and Herceptin/Perjeta in 3 weeks on 10/29 -CT CAP W Contrast on 10/27 and f/u on 10/29    No problem-specific Assessment & Plan notes found for this encounter.   Orders Placed This Encounter  Procedures   CT Abdomen Pelvis W Contrast    Standing Status:   Future    Standing Expiration Date:   04/05/2020    Order Specific Question:   If indicated for the ordered procedure, I authorize the administration of contrast media per Radiology protocol    Answer:   Yes    Order Specific Question:   Preferred imaging location?    Answer:   [facility]    Order Specific Question:   Is Oral  Contrast requested for this exam?    Answer:   Yes, Per Radiology protocol    Order Specific Question:   Radiology Contrast Protocol - do NOT remove file path    Answer:   [email or web address]   CT Chest W Contrast    Standing Status:   Future    Standing Expiration Date:   04/05/2020    Order Specific Question:   If indicated for the ordered procedure, I authorize the administration of contrast media per Radiology protocol    Answer:  Yes    Order Specific Question:   Preferred imaging location?    Answer:   [facility]    Order Specific Question:   Radiology Contrast Protocol - do NOT remove file path    Answer:   [email or web address]   Vitamin B12    Ad on to blood draw today    Standing Status:   Future    Number of Occurrences:   1    Standing Expiration Date:   04/05/2020   Ambulatory referral to Physical Therapy    Referral Priority:   Routine    Referral Type:   Physical Medicine    Referral Reason:   Specialty Services Required    Requested Specialty:   Physical Therapy    Number of Visits Requested:   1   All questions were answered. The patient knows to call the clinic with any problems, questions or concerns. No barriers to learning was detected. I spent 25 minutes counseling the patient face to face. The total time spent in the appointment was 30 minutes and more than 50% was on counseling and review of test results     [name], MD 04/06/2019   I, Joslyn Devon, am acting as scribe for [name], MD.   I have reviewed the above documentation for accuracy and completeness, and I agree with the above.

## 2019-04-02 NOTE — Progress Notes (Addendum)
Cardio-Oncology Hays Note    Date:  04/02/2019   ID:  Teresa Hays, DOB May 21, 1956, MRN [id number]  Location: Home  Provider location: Teresa Hays Type of Visit: New patient  PCP:  Teresa Bacon, NP  Cardiologist:  No primary care provider on file.  Referring: Teresa Hays   History of Present Illness:  Teresa Hays is a 63 y/o woman with GERD, HTN and metastatic breast CA referred by Teresa Hays for enrollment into the Cardio-Oncology program.   SUMMARY OF ONCOLOGIC HISTORY:     Oncology History   Cancer Staging Metastatic breast cancer [facility]) Staging form: Breast, AJCC 8th Edition - Clinical stage from 03/24/2018: Stage IV (cT2, cN1, pM1, G3, ER+, PR+, HER2+) - Signed by Teresa Merle, MD on 03/30/2018       Metastatic breast cancer ([place])   01/30/2018 Procedure    Colonoscopy showed small polyp in the sigmoid colon, removed, the exam of colon including the terminal ileum was otherwise negative.    01/30/2018 Procedure    EGD by Dr. Collene Mares showed small hiatal hernia, a 8 mm polypoid lesion in the cardia, biopsied.    03/09/2018 Imaging    03/09/2018 US Abdomen IMPRESSION: 1. Mass lesions throughout the liver, consistent with metastatic disease. Liver as a somewhat nodular contour suggesting underlying hepatic cirrhosis. Inhomogeneous echotexture to the liver.  2. Cholelithiasis with mild gallbladder wall thickening. A degree of cholecystitis cannot be excluded by ultrasound.  3. Portions of pancreas obscured by gas. Visualized portions of pancreas appear normal.  4. Small right kidney. Etiology uncertain. This finding potentially may be indicative of renal artery stenosis. In this regard, question whether patient is hypertensive.    03/15/2018 Imaging    CT CAP with contrast  IMPRESSION: 1. Widespread hepatic metastasis. 2. 2.6 cm lateral right breast soft tissue nodule could represent a breast primary or an incidental  benign lesion. Consider correlation with mammogram and ultrasound. 3. No definite source of primary malignancy identified within the abdomen or pelvis. There is possible rectosigmoid junction wall thickening. Consider colonoscopy with attention to this area. 4. Distal esophageal wall thickening, suggesting esophagitis.    03/24/2018 Cancer Staging    Staging form: Breast, AJCC 8th Edition - Clinical stage from 03/24/2018: Stage IV (cT2, cN1, pM1, G3, ER+, PR+, HER2+) - Signed by Teresa Merle, MD on 03/30/2018    03/24/2018 Initial Biopsy    Diagnosis 1. 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 Breast, right, needle core biopsy, 9 o'clock, 7cm from nipple - INVASIVE DUCTAL CARCINOMA. -The carcinoma is somewhat morphologically dissimilar from that in part 1. It appears grade III 3. Lymph node, needle/core biopsy, right axillary - METASTATIC CARCINOMA IN 1 OF 1 LYMPH NODE (1/1).    03/24/2018 Receptors her2    Breast biopsy: 1. Estrogen Receptor: 40%, POSITIVE, STRONG-MODERATE STAINING INTENSITY Progesterone Receptor: 70%, POSITIVE, STRONG STAINING INTENSITY Proliferation Marker Ki67: 20% HER 2 equivocal by IHC 2+, POSITIVE by FISH, ratio 2.4 and copy #4.2  2. Estrogen Receptor: 60%, POSITIVE, MODERATE STAINING INTENSITY Progesterone Receptor: 40%, POSITIVE, MODERATE STAINING INTENSITY Proliferation Marker Ki67: 20% HER2 (+) by IHC 3+    03/24/2018 Initial Diagnosis    Metastatic breast cancer ([place])    03/27/2018 Pathology Results    Diagnosis Liver, needle/core biopsy, Right - METASTATIC CARCINOMA TO LIVER, CONSISTENT WITH PATIENTS CLINICAL HISTORY OF PRIMARY BREAST CARCINOMA.  ER 80%+  PR40%+ HER2- (by [facility], IHC 2+)  Ki67 50%     03/28/2018 Pathology Results    03/28/2018 Surgical Pathology Diagnosis 1. Breast, left, needle core biopsy, 9 o'clock - FIBROCYSTIC  CHANGES. - THERE IS NO EVIDENCE OF MALIGNANCY. 2. Breast, left, needle core biopsy, 2 o'clock - FIBROADENOMA. - THERE IS NO EVIDENCE OF MALIGNANCY. - SEE COMMENT.    03/29/2018 Imaging    03/29/2018 Bone Scan IMPRESSION: No scintigraphic evidence of osseous metastatic disease.    03/30/2018 Imaging    Bone scan  IMPRESSION: No scintigraphic evidence of osseous metastatic disease.     04/07/2018 -  Chemotherapy    First line chemo weekly Taxol and herceptin/Perjeta every 3 weeks starting 04/07/18. She developed infusion reaction to taxol and it was discontinued. Added Abraxane on C1D8, 2 weeks on/1 week off.  Abraxne stopped after 10/13/18 due to worsening Neuropathy    06/06/2018 Imaging    CT CAP IMPRESSION: 1. Generally improved appearance, with reduced axillary adenopathy and reduced enhancing component of the hepatic masses, with some of the hepatic mass is moderately smaller than on the prior exam. Reduced size of the right lateral breast mass compared to the prior 03/15/2018 exam. 2. New mild interstitial accentuation in the lungs, significance uncertain. Part of this appearance may be due to lower lung volumes on today's exam. 3. Mild wall thickening in the descending colon and upper rectum suggesting low-grade colitis/inflammation. Prominent stool throughout the colon favors constipation. 4. Other imaging findings of potential clinical significance: Aortic Atherosclerosis (ICD10-I70.0). Mild cardiomegaly. Mild nodularity in the right lower lobe appears stable. Contracted and thick-walled gallbladder.     09/18/2018 Imaging    CT CAP W Contrast 09/18/18  IMPRESSION: 1. Liver metastases have decreased in size. 2. Right breast mass, mild right axillary lymphadenopathy and scattered tiny right pulmonary nodules are all stable. 3. New mild left supraclavicular and left subpectoral lymphadenopathy, can not exclude progression of metastatic nodal disease. 4.  Moderate colorectal stool volume, which may indicate constipation. 5. Aortic Atherosclerosis (ICD10-I70.0).    10/2018 -  Anti-estrogen oral therapy    Letrozole 2.5 mg daily starting 10/2018     She presents today for routine f/u. She has underwent first-line weekly TaxolwithHerceptin/Perjeta every 3 weeks starting 04/07/18. She developed infusion reaction to taxol and it was discontinued. Changed to Abraxaneweekly2 weeks on/1 week offfrom 07/21/2018.Abraxane held after October 13, 2018 due to his worsening neuropathy. Now on Letrozole 2.5 mg daily starting 10/2018. Remains on Herceptin/Perjeta.We had a telehealth visit last month and EF felt to be mildly reduced at 45-50%. Herceptin/perjeta held for one dose. Now on losartan and carvedilol. Says she feels good. No SOB, orthopnea or PND.   Echo today EF 50-55% GLS - 14.4% (underestimated) Personally reviewed   Echo 02/27/19 EF 50% GLS -21.4%   Echo 07/28/18 EF 60-65% GLS -19.2% Echo 12/1018  EF 55-60% GLS -16.1%     Past Medical History:  Diagnosis Date   GERD (gastroesophageal reflux disease)    Hypertension    rt breast ca with mets to liver dx'd 03/2018   Past Surgical History:  Procedure Laterality Date   COLONOSCOPY     ESOPHAGOGASTRODUODENOSCOPY ENDOSCOPY     IR IMAGING GUIDED PORT INSERTION  04/04/2018     Current Outpatient Medications  Medication Sig Dispense Refill   albuterol (VENTOLIN HFA) 108 (90 Base) MCG/ACT inhaler Inhale 2 puffs into the lungs every 6 (six) hours as needed for wheezing or shortness of breath. 6.7 g 1  amLODipine (NORVASC) 5 MG tablet Take 5 mg by mouth daily.      carvedilol (COREG) 3.125 MG tablet Take 1 tablet (3.125 mg total) by mouth 2 (two) times daily. 180 tablet 3   chlorpheniramine-HYDROcodone (TUSSIONEX) 10-8 MG/5ML SUER Take 5 mLs by mouth at bedtime as needed for cough. 115 mL 0   diphenoxylate-atropine (LOMOTIL) 2.5-0.025 MG tablet Take 2 tablets by mouth 4 (four)  times daily as needed for diarrhea or loose stools. 30 tablet 0   fluticasone (FLONASE) 50 MCG/ACT nasal spray Place 2 sprays into both nostrils 2 (two) times daily as needed for allergies or rhinitis. 16 g 0   gabapentin (NEURONTIN) 300 MG capsule Take 1 capsule (300 mg total) by mouth 3 (three) times daily. Take 2 capsules at night 120 capsule 3   letrozole (FEMARA) 2.5 MG tablet Take 1 tablet (2.5 mg total) by mouth daily. 90 tablet 1   lidocaine-prilocaine (EMLA) cream Apply to affected area once 30 g 3   LORazepam (ATIVAN) 0.5 MG tablet Take 1 tablet (0.5 mg total) by mouth at bedtime as needed for anxiety. 30 tablet 0   losartan (COZAAR) 25 MG tablet Take 1 tablet (25 mg total) by mouth daily. 90 tablet 3   omeprazole (PRILOSEC) 20 MG capsule TAKE 1 CAPSULE BY MOUTH ONCE DAILY 30 capsule 2   ondansetron (ZOFRAN) 8 MG tablet Take 1 tablet (8 mg total) by mouth 2 (two) times daily as needed (Nausea or vomiting). 30 tablet 1   potassium chloride SA (K-DUR) 20 MEQ tablet Take 20 mEq by mouth every other day.     rivaroxaban (XARELTO) 20 MG TABS tablet Take 1 tablet (20 mg total) by mouth daily with supper. 30 tablet 5   traMADol (ULTRAM) 50 MG tablet Take 1 tablet (50 mg total) by mouth every 6 (six) hours as needed for moderate pain or severe pain. 30 tablet 0   No current facility-administered medications for this encounter.     Allergies:   Patient has no known allergies.   Social History:  The patient  reports that she has never smoked. She has never used smokeless tobacco. She reports that she does not drink alcohol or use drugs.   Family History:  The patient's family history includes Cancer (age of onset: 33) in her sister; Diabetes in her mother; Rheum arthritis in her sister.   ROS:  Please see the history of present illness.   All other systems are personally reviewed and negative.     Vitals:   04/02/19 1546  BP: (!) 158/82  Pulse: (!) 58  SpO2: 94%  Weight:  64.1 kg (141 lb 6 oz)     Exam:   General:  Well appearing. No resp difficulty HEENT: normal Neck: supple. no JVD. Carotids 2+ bilat; no bruits. No lymphadenopathy or thryomegaly appreciated. Cor: PMI nondisplaced. Regular rate & rhythm. No rubs, gallops or murmurs. Lungs: clear Abdomen: soft, nontender, nondistended. No hepatosplenomegaly. No bruits or masses. Good bowel sounds. Extremities: no cyanosis, clubbing, rash, trace edema on LLE  Neuro: alert & orientedx3, cranial nerves grossly intact. moves all 4 extremities w/o difficulty. Affect pleasant   Recent Labs: 02/16/2019: ALT 20; BUN 17; Creatinine 1.03; Hemoglobin 10.8; Platelet Count 199; Potassium 3.8; Sodium 140  Personally reviewed   Wt Readings from Last 3 Encounters:  04/02/19 64.1 kg (141 lb 6 oz)  02/16/19 63.3 kg (139 lb 8 oz)  01/26/19 63.1 kg (139 lb 1.6 oz)  ASSESSMENT AND PLAN:  1. Breast cancer, metastatic - She has underwent first-line weekly TaxolwithHerceptin/Perjeta every 3 weeks starting 04/07/18. She developed infusion reaction to taxol and it was discontinued. Changed to Abraxaneweekly2 weeks on/1 week offfrom 07/21/2018.Abraxane held after October 13, 2018 due to his worsening neuropathy. Remains on Herceptin/Perjeta. Now on Letrozole 2.5 mg daily starting 10/2018 - Echo 8/20 EF down to 45-50% suspicious for mild Herceptin cardiotoxicity.  - Herceptin/perjeta for one dose. EF 50-55% today  - Continue carvedilol 3.125 bid - Increase losartan to 86m daily - Ok to resume herceptin/perjeta with close f/u as ideally will need life-long therapy - Repeat echo 2 months.   2. HTN - BP up - increase losartan  3. RUE DVT - continue Xarelto.   Signed, DGlori Bickers MD  04/02/2019 4:19 PM  [facility]Heart and [id number]([phone](office) ([phone](fax)

## 2019-04-06 ENCOUNTER — Inpatient Hospital Stay (HOSPITAL_BASED_OUTPATIENT_CLINIC_OR_DEPARTMENT_OTHER): Payer: 59 | Admitting: Hematology

## 2019-04-06 ENCOUNTER — Other Ambulatory Visit: Payer: Self-pay

## 2019-04-06 ENCOUNTER — Other Ambulatory Visit: Payer: Self-pay | Admitting: Hematology

## 2019-04-06 ENCOUNTER — Inpatient Hospital Stay: Payer: 59

## 2019-04-06 ENCOUNTER — Inpatient Hospital Stay: Payer: 59 | Attending: Hematology

## 2019-04-06 ENCOUNTER — Encounter: Payer: Self-pay | Admitting: Hematology

## 2019-04-06 VITALS — BP 155/79 | HR 64 | Temp 98.5°F | Resp 20 | Ht 64.0 in | Wt 138.7 lb

## 2019-04-06 VITALS — BP 156/86 | HR 62 | Temp 98.3°F | Resp 18

## 2019-04-06 DIAGNOSIS — Z95828 Presence of other vascular implants and grafts: Secondary | ICD-10-CM

## 2019-04-06 DIAGNOSIS — R208 Other disturbances of skin sensation: Secondary | ICD-10-CM | POA: Diagnosis not present

## 2019-04-06 DIAGNOSIS — Z86718 Personal history of other venous thrombosis and embolism: Secondary | ICD-10-CM | POA: Insufficient documentation

## 2019-04-06 DIAGNOSIS — Z7189 Other specified counseling: Secondary | ICD-10-CM

## 2019-04-06 DIAGNOSIS — Z170 Estrogen receptor positive status [ER+]: Secondary | ICD-10-CM | POA: Insufficient documentation

## 2019-04-06 DIAGNOSIS — T451X5A Adverse effect of antineoplastic and immunosuppressive drugs, initial encounter: Secondary | ICD-10-CM | POA: Insufficient documentation

## 2019-04-06 DIAGNOSIS — G620 Drug-induced polyneuropathy: Secondary | ICD-10-CM

## 2019-04-06 DIAGNOSIS — R590 Localized enlarged lymph nodes: Secondary | ICD-10-CM | POA: Diagnosis not present

## 2019-04-06 DIAGNOSIS — Z79811 Long term (current) use of aromatase inhibitors: Secondary | ICD-10-CM | POA: Diagnosis not present

## 2019-04-06 DIAGNOSIS — Z5112 Encounter for antineoplastic immunotherapy: Secondary | ICD-10-CM | POA: Diagnosis not present

## 2019-04-06 DIAGNOSIS — N270 Small kidney, unilateral: Secondary | ICD-10-CM | POA: Insufficient documentation

## 2019-04-06 DIAGNOSIS — D631 Anemia in chronic kidney disease: Secondary | ICD-10-CM | POA: Insufficient documentation

## 2019-04-06 DIAGNOSIS — C787 Secondary malignant neoplasm of liver and intrahepatic bile duct: Secondary | ICD-10-CM | POA: Insufficient documentation

## 2019-04-06 DIAGNOSIS — Z23 Encounter for immunization: Secondary | ICD-10-CM

## 2019-04-06 DIAGNOSIS — G629 Polyneuropathy, unspecified: Secondary | ICD-10-CM | POA: Insufficient documentation

## 2019-04-06 DIAGNOSIS — R160 Hepatomegaly, not elsewhere classified: Secondary | ICD-10-CM

## 2019-04-06 DIAGNOSIS — D3501 Benign neoplasm of right adrenal gland: Secondary | ICD-10-CM | POA: Diagnosis not present

## 2019-04-06 DIAGNOSIS — K449 Diaphragmatic hernia without obstruction or gangrene: Secondary | ICD-10-CM | POA: Diagnosis not present

## 2019-04-06 DIAGNOSIS — K8010 Calculus of gallbladder with chronic cholecystitis without obstruction: Secondary | ICD-10-CM | POA: Insufficient documentation

## 2019-04-06 DIAGNOSIS — D6481 Anemia due to antineoplastic chemotherapy: Secondary | ICD-10-CM | POA: Diagnosis not present

## 2019-04-06 DIAGNOSIS — C50919 Malignant neoplasm of unspecified site of unspecified female breast: Secondary | ICD-10-CM | POA: Diagnosis not present

## 2019-04-06 DIAGNOSIS — R600 Localized edema: Secondary | ICD-10-CM | POA: Diagnosis not present

## 2019-04-06 DIAGNOSIS — M25511 Pain in right shoulder: Secondary | ICD-10-CM | POA: Insufficient documentation

## 2019-04-06 DIAGNOSIS — Z7901 Long term (current) use of anticoagulants: Secondary | ICD-10-CM | POA: Diagnosis not present

## 2019-04-06 DIAGNOSIS — Z79899 Other long term (current) drug therapy: Secondary | ICD-10-CM | POA: Diagnosis not present

## 2019-04-06 DIAGNOSIS — C50411 Malignant neoplasm of upper-outer quadrant of right female breast: Secondary | ICD-10-CM | POA: Diagnosis not present

## 2019-04-06 DIAGNOSIS — C50911 Malignant neoplasm of unspecified site of right female breast: Secondary | ICD-10-CM | POA: Diagnosis present

## 2019-04-06 DIAGNOSIS — I700 Atherosclerosis of aorta: Secondary | ICD-10-CM | POA: Insufficient documentation

## 2019-04-06 LAB — CBC WITH DIFFERENTIAL (CANCER CENTER ONLY)
Abs Immature Granulocytes: 0.01 10*3/uL (ref 0.00–0.07)
Basophils Absolute: 0.1 10*3/uL (ref 0.0–0.1)
Basophils Relative: 1 %
Eosinophils Absolute: 0.6 10*3/uL — ABNORMAL HIGH (ref 0.0–0.5)
Eosinophils Relative: 9 %
HCT: 34.5 % — ABNORMAL LOW (ref 36.0–46.0)
Hemoglobin: 11.1 g/dL — ABNORMAL LOW (ref 12.0–15.0)
Immature Granulocytes: 0 %
Lymphocytes Relative: 33 %
Lymphs Abs: 2 10*3/uL (ref 0.7–4.0)
MCH: 28.1 pg (ref 26.0–34.0)
MCHC: 32.2 g/dL (ref 30.0–36.0)
MCV: 87.3 fL (ref 80.0–100.0)
Monocytes Absolute: 0.3 10*3/uL (ref 0.1–1.0)
Monocytes Relative: 4 %
Neutro Abs: 3.1 10*3/uL (ref 1.7–7.7)
Neutrophils Relative %: 53 %
Platelet Count: 213 10*3/uL (ref 150–400)
RBC: 3.95 MIL/uL (ref 3.87–5.11)
RDW: 14.6 % (ref 11.5–15.5)
WBC Count: 5.9 10*3/uL (ref 4.0–10.5)
nRBC: 0 % (ref 0.0–0.2)

## 2019-04-06 LAB — CMP (CANCER CENTER ONLY)
ALT: 17 U/L (ref 0–44)
AST: 25 U/L (ref 15–41)
Albumin: 4.1 g/dL (ref 3.5–5.0)
Alkaline Phosphatase: 119 U/L (ref 38–126)
Anion gap: 9 (ref 5–15)
BUN: 16 mg/dL (ref 8–23)
CO2: 26 mmol/L (ref 22–32)
Calcium: 9.6 mg/dL (ref 8.9–10.3)
Chloride: 105 mmol/L (ref 98–111)
Creatinine: 1.11 mg/dL — ABNORMAL HIGH (ref 0.44–1.00)
GFR, Est AFR Am: >60 mL/min (ref >60)
GFR, Estimated: 53 mL/min — ABNORMAL LOW (ref >60)
Glucose, Bld: 104 mg/dL — ABNORMAL HIGH (ref 70–99)
Potassium: 3.9 mmol/L (ref 3.5–5.1)
Sodium: 140 mmol/L (ref 135–145)
Total Bilirubin: 0.2 mg/dL — ABNORMAL LOW (ref 0.3–1.2)
Total Protein: 7.4 g/dL (ref 6.5–8.1)

## 2019-04-06 LAB — VITAMIN B12: Vitamin B-12: 450 pg/mL (ref 180–914)

## 2019-04-06 MED ORDER — SODIUM CHLORIDE 0.9% FLUSH
10.0000 mL | INTRAVENOUS | Status: DC | PRN
  Administered 2019-04-06: 10 mL
  Filled 2019-04-06: qty 10

## 2019-04-06 MED ORDER — LIDOCAINE-PRILOCAINE 2.5-2.5 % EX CREA: TOPICAL_CREAM | CUTANEOUS | 3 refills | Status: DC

## 2019-04-06 MED ORDER — ACETAMINOPHEN 325 MG PO TABS
ORAL_TABLET | ORAL | Status: AC
  Filled 2019-04-06: qty 2

## 2019-04-06 MED ORDER — SODIUM CHLORIDE 0.9 % IV SOLN
Freq: Once | INTRAVENOUS | Status: AC
  Administered 2019-04-06: 11:00:00 via INTRAVENOUS
  Filled 2019-04-06: qty 250

## 2019-04-06 MED ORDER — INFLUENZA VAC SPLIT QUAD 0.5 ML IM SUSY
PREFILLED_SYRINGE | INTRAMUSCULAR | Status: AC
  Filled 2019-04-06: qty 0.5

## 2019-04-06 MED ORDER — DIPHENHYDRAMINE HCL 25 MG PO CAPS
ORAL_CAPSULE | ORAL | Status: AC
  Filled 2019-04-06: qty 1

## 2019-04-06 MED ORDER — HEPARIN SOD (PORK) LOCK FLUSH 100 UNIT/ML IV SOLN
500.0000 [IU] | Freq: Once | INTRAVENOUS | Status: AC | PRN
  Administered 2019-04-06: 500 [IU]
  Filled 2019-04-06: qty 5

## 2019-04-06 MED ORDER — DIPHENHYDRAMINE HCL 50 MG/ML IJ SOLN
25.0000 mg | Freq: Once | INTRAMUSCULAR | Status: AC
  Administered 2019-04-06: 25 mg via INTRAVENOUS

## 2019-04-06 MED ORDER — TRASTUZUMAB-DKST CHEMO 150 MG IV SOLR
8.0000 mg/kg | Freq: Once | INTRAVENOUS | Status: AC
  Administered 2019-04-06: 525 mg via INTRAVENOUS
  Filled 2019-04-06: qty 25

## 2019-04-06 MED ORDER — TRASTUZUMAB-DKST CHEMO 150 MG IV SOLR
8.0000 mg/kg | Freq: Once | INTRAVENOUS | Status: DC
  Filled 2019-04-06: qty 25

## 2019-04-06 MED ORDER — SODIUM CHLORIDE 0.9 % IV SOLN
840.0000 mg | Freq: Once | INTRAVENOUS | Status: AC
  Administered 2019-04-06: 840 mg via INTRAVENOUS
  Filled 2019-04-06: qty 28

## 2019-04-06 MED ORDER — ACETAMINOPHEN 325 MG PO TABS
650.0000 mg | ORAL_TABLET | Freq: Once | ORAL | Status: AC
  Administered 2019-04-06: 650 mg via ORAL

## 2019-04-06 MED ORDER — INFLUENZA VAC SPLIT QUAD 0.5 ML IM SUSY
0.5000 mL | PREFILLED_SYRINGE | Freq: Once | INTRAMUSCULAR | Status: AC
  Administered 2019-04-06: 0.5 mL via INTRAMUSCULAR

## 2019-04-06 MED FILL — LIDOCAINE-PRILOCAINE CREAM: 2.5-2.5 | 30 days supply | Qty: 30 | Fill #0

## 2019-04-06 NOTE — Patient Instructions (Signed)
[facility] Discharge Instructions for Patients Receiving Chemotherapy  Today you received the following chemotherapy agents Trastuzumab  & Pertuzumab .  To help prevent nausea and vomiting after your treatment, we encourage you to take your nausea medication as prescribed.   If you develop nausea and vomiting that is not controlled by your nausea medication, call the clinic.   BELOW ARE SYMPTOMS THAT SHOULD BE REPORTED IMMEDIATELY:  *FEVER GREATER THAN 100.5 F  *CHILLS WITH OR WITHOUT FEVER  NAUSEA AND VOMITING THAT IS NOT CONTROLLED WITH YOUR NAUSEA MEDICATION  *UNUSUAL SHORTNESS OF BREATH  *UNUSUAL BRUISING OR BLEEDING  TENDERNESS IN MOUTH AND THROAT WITH OR WITHOUT PRESENCE OF ULCERS  *URINARY PROBLEMS  *BOWEL PROBLEMS  UNUSUAL RASH Items with * indicate a potential emergency and should be followed up as soon as possible.  Feel free to call the clinic should you have any questions or concerns. The clinic phone number is [phone].  Please show the [place] at check-in to the Emergency Department and triage nurse.

## 2019-04-07 ENCOUNTER — Encounter: Payer: Self-pay | Admitting: Hematology

## 2019-04-07 LAB — CANCER ANTIGEN 27.29: CA 27.29: 57.4 U/mL — ABNORMAL HIGH (ref 0.0–38.6)

## 2019-04-09 ENCOUNTER — Telehealth: Payer: Self-pay | Admitting: Hematology

## 2019-04-09 NOTE — Telephone Encounter (Signed)
Scheduled appt per 9/18 los.  Spoke with patient and she is aware of her appt date and time.  Per the patient requested I deleted her home number for a contact.

## 2019-04-10 NOTE — Telephone Encounter (Signed)
Refill and dose adjustment

## 2019-04-11 NOTE — Telephone Encounter (Signed)
Per Althia Forts RN called pt. Awaiting callback

## 2019-04-12 ENCOUNTER — Encounter: Payer: Self-pay | Admitting: Hematology

## 2019-04-13 ENCOUNTER — Other Ambulatory Visit: Payer: Self-pay

## 2019-04-13 ENCOUNTER — Ambulatory Visit: Payer: 59 | Attending: Hematology

## 2019-04-13 ENCOUNTER — Ambulatory Visit: Payer: 59

## 2019-04-13 DIAGNOSIS — G620 Drug-induced polyneuropathy: Secondary | ICD-10-CM | POA: Insufficient documentation

## 2019-04-13 DIAGNOSIS — T451X5A Adverse effect of antineoplastic and immunosuppressive drugs, initial encounter: Secondary | ICD-10-CM | POA: Diagnosis not present

## 2019-04-13 DIAGNOSIS — R2689 Other abnormalities of gait and mobility: Secondary | ICD-10-CM | POA: Insufficient documentation

## 2019-04-13 DIAGNOSIS — C50919 Malignant neoplasm of unspecified site of unspecified female breast: Secondary | ICD-10-CM | POA: Insufficient documentation

## 2019-04-13 MED ORDER — GABAPENTIN 300 MG PO CAPS: ORAL_CAPSULE | ORAL | 3 refills | Status: DC

## 2019-04-13 MED FILL — GABAPENTIN 300 MG CAPSULE: 300 | 150 days supply | Qty: 150 | Fill #0

## 2019-04-13 NOTE — Therapy (Signed)
[place], [place], [address] Phone: [phone]   Fax:  [phone]  Physical Therapy Evaluation  Patient Details  Name: Teresa Hays MRN: [id number] Date of Birth: 20-Feb-1956 Referring Provider (PT): [name]   Encounter Date: 04/13/2019  PT End of Session - 04/13/19 0916    Visit Number  1    Number of Visits  9    Date for PT Re-Evaluation  05/13/19    PT Start Time  0830    PT Stop Time  0900    PT Time Calculation (min)  30 min    Activity Tolerance  Patient tolerated treatment well    Behavior During Therapy  [facility] for tasks assessed/performed       Past Medical History:  Diagnosis Date  . GERD (gastroesophageal reflux disease)   . Hypertension   . rt breast ca with mets to liver dx'd 03/2018    Past Surgical History:  Procedure Laterality Date  . COLONOSCOPY    . ESOPHAGOGASTRODUODENOSCOPY ENDOSCOPY    . IR IMAGING GUIDED PORT INSERTION  04/04/2018    There were no vitals filed for this visit.   Subjective Assessment - 04/13/19 0837    Subjective  Pt reports that she has tingling in her fingers and feet that gets worse at night and in the morning . Pt states that she can usually keep her balance by holding onto things when she gets up and has not had any falls in the past 6 months. She states that her sister got her walking and she is able to walk about 1/3 mile now which she thinks has helped the swelling in her BLE decrease.    Pertinent History  Metastatic right breast cancer to liver, cT2N1M1, stage IV, ER+/PR+/HER2+, Liver mets ER+/PR+/HER2-, HTN (controlled)    Currently in Pain?  Yes    Pain Score  7     Pain Location  Foot    Pain Orientation  Right;Left    Pain Descriptors / Indicators  Pins and needles    Pain Type  Chronic pain    Pain Onset  More than a month ago    Pain Frequency  Intermittent    Aggravating Factors   at night and in the morning    Pain  Relieving Factors  as the day progresses.    Multiple Pain Sites  Yes    Pain Score  6   B hands        [facility] PT Assessment - 04/13/19 0001      Assessment   Medical Diagnosis  Metastatic Breast cancer, Neuropathy    Referring Provider (PT)  [name]    Hand Dominance  Right    Next MD Visit  05/17/19    Prior Therapy  None      Precautions   Precautions  Other (comment)   Metastatic Breast Cancer     Balance Screen   Has the patient fallen in the past 6 months  No    Has the patient had a decrease in activity level because of a fear of falling?   Yes    Is the patient reluctant to leave their home because of a fear of falling?   No      Home Environment   Living Environment  Private residence    Living Arrangements  Alone    Type of [place] Access  Level entry  Home Layout  Two level    Alternate Level Stairs-Number of Steps  14    Alternate Level Stairs-Rails  Left    Additional Comments  Pt has a sister that lives next door and another one lives in the same set of town homes.       Prior Function   Level of Independence  Independent    Vocation  On disability    Leisure  Pt likes to work on Geneticist, molecular   Overall Cognitive Status  Within Functional Limits for tasks assessed      Observation/Other Assessments   Skin Integrity  Skin warm, dry and intact       Balance   Balance Assessed  Yes      Standardized Balance Assessment   Standardized Balance Assessment  --      High Level Balance   High Level Balance Activites  Other (comment)   MCTSIB   High Level Balance Comments  Pt performed scenario 1 of MCTSIB with SBA and was only able to hold scenario 2 of MCTSIB for 8 seconds before stepping strategy to avoid LOB. Scenario 3 and 4 were not assessed.         LYMPHEDEMA/ONCOLOGY QUESTIONNAIRE - 04/13/19 0852      Type   Cancer Type  Metastatic Breast Cancer      Treatment   Active Chemotherapy Treatment  Yes    Date  04/13/19     Active Radiation Treatment  No    Past Radiation Treatment  No    Current Hormone Treatment  Yes    Date  04/13/19    Drug Name  Femara       What other symptoms do you have   Are you Having Heaviness or Tightness  No    Are you having Pain  Yes    Are you having pitting edema  Yes    Body Site  R ankle    Is it Hard or Difficult finding clothes that fit  No             Outpatient Rehab from 04/13/2019 in [place] [facility]  Lymphedema Life Impact Scale Total Score  26.47 %      Objective measurements completed on examination: See above findings.              PT Education - 04/13/19 0914    Education Details  Pt was educated on the purpose of physical therapy in this stage of her treatment. Discussed that physical therapy will not cure neuropathy but we can assist with improved balance/gait which can help with swelling/pain. Pt was educated on the role of physical therapy in relation to chemotherapy including activity possibly helping to decrease fatigue and side effects associated with medication.    Person(s) Educated  Patient    Methods  Explanation    Comprehension  Verbalized understanding       PT Short Term Goals - 04/13/19 2482      PT SHORT TERM GOAL #1   Title  Pt will perform scenario 2 of the MCTSIB standing with eyes closed/feet together for 30 seconds without corret hip/ankle strategies to avoid stepping strategy within 4 weeks.    Baseline  Pt was only able to complete scenario 1 standing in rhomber eyes open for 30 secodns and SBA.    Time  4    Period  Weeks    Status  New    Target  Date  05/18/19        PT Long Term Goals - 04/13/19 1505      PT LONG TERM GOAL #1   Title  Pt will improve BERG balance score by 8 point within 8 weeks in order to demontsrate a decreased risk for falls.    Baseline  TBD    Time  8    Period  Weeks    Status  New    Target Date  06/15/19      PT LONG TERM GOAL #2   Title   Pt will be able to ambultae 0.5 miles within 8 weeks to demonstrate improved activity tolerance.    Baseline  1/3 mile measured by pt activity watch.    Time  8    Period  Weeks    Status  New    Target Date  06/15/19      PT LONG TERM GOAL #3   Title  Pt will report 4/10 pain in B hands and 5/10 pain in B feet following 8 weeks of physical therapy.    Baseline  Pain 6/10 hands, 7/10 feet    Time  8    Period  Weeks    Status  New    Target Date  06/15/19             Plan - 04/13/19 0916    Clinical Impression Statement  Pt presents to physical therapy 30 min late due to she was lost. Pt has significant balance deficits related to chemotherapy induced neuropathy as demonstrated by SBA for MCTSIB scenario one and inability to finish the second scenario. Pt is currently ambulating at home but has to catch herself from falling and has had no current falls. Pt will benefit from skilled physical therapy services in order to address the above limitations in order to decrease risk for falls and serious injury.    Personal Factors and Comorbidities  Comorbidity 2    Comorbidities  Metastatic Breast Cancer, Continued chemotherapy treatment    Examination-Activity Limitations  Stairs;Locomotion Level    Stability/Clinical Decision Making  Stable/Uncomplicated    Clinical Decision Making  Low    Rehab Potential  Good    PT Frequency  1x / week    PT Duration  8 weeks    PT Treatment/Interventions  Gait training;Stair training;Functional mobility training;Therapeutic activities;Therapeutic exercise;Balance training;Neuromuscular re-education;Patient/family education;Manual techniques;Manual lymph drainage;Passive range of motion;Taping;Vasopneumatic Device    PT Next Visit Plan  BERG, Add balance exercises for HEP    Consulted and Agree with Plan of Care  Patient       Patient will benefit from skilled therapeutic intervention in order to improve the following deficits and impairments:   Difficulty walking, Decreased safety awareness, Decreased balance  Visit Diagnosis: Peripheral neuropathy due to chemotherapy ([place]) - Plan: PT plan of care cert/re-cert  Metastatic breast cancer ([place]) - Plan: PT plan of care cert/re-cert  Other abnormalities of gait and mobility - Plan: PT plan of care cert/re-cert     Problem List Patient Active Problem List   Diagnosis Date Noted  . Peripheral neuropathy due to chemotherapy ([place]) 04/06/2019  . Anemia due to chemotherapy 08/03/2018  . Nausea with vomiting 04/25/2018  . Port-A-Cath in place 04/21/2018  . Metastatic breast cancer ([place]) 03/30/2018  . Goals of care, counseling/discussion 03/30/2018  . Liver masses 03/16/2018    Ander Purpura, PT 04/13/2019, 9:31 AM  [place],  [place], [address] Phone: [phone]   Fax:  [phone]  Name: WANDALENE ABRAMS MRN: [id number] Date of Birth: Jul 02, 1956

## 2019-04-16 ENCOUNTER — Other Ambulatory Visit: Payer: Self-pay | Admitting: Hematology

## 2019-04-16 DIAGNOSIS — C50919 Malignant neoplasm of unspecified site of unspecified female breast: Secondary | ICD-10-CM

## 2019-04-16 MED FILL — OMEPRAZOLE 20 MG CAP: 20 | 30 days supply | Qty: 30 | Fill #0

## 2019-04-16 MED FILL — POTASSIUM CHLORIDE CRYS ER: 20 | 30 days supply | Qty: 30 | Fill #0

## 2019-04-16 MED FILL — XARELTO 20 MG TABLET: 20 | 30 days supply | Qty: 30 | Fill #5

## 2019-04-17 ENCOUNTER — Other Ambulatory Visit: Payer: Self-pay

## 2019-04-17 ENCOUNTER — Ambulatory Visit: Payer: 59

## 2019-04-17 DIAGNOSIS — T451X5A Adverse effect of antineoplastic and immunosuppressive drugs, initial encounter: Secondary | ICD-10-CM | POA: Diagnosis not present

## 2019-04-17 DIAGNOSIS — G620 Drug-induced polyneuropathy: Secondary | ICD-10-CM

## 2019-04-17 DIAGNOSIS — C50919 Malignant neoplasm of unspecified site of unspecified female breast: Secondary | ICD-10-CM

## 2019-04-17 DIAGNOSIS — R2689 Other abnormalities of gait and mobility: Secondary | ICD-10-CM | POA: Diagnosis not present

## 2019-04-17 NOTE — Patient Instructions (Signed)
Heel Raise: Bilateral (Standing)   Cancer Rehab [phone]    Stand near counter for fingertip support if needed. Rise on balls of feet. Repeat __10-20__ times per set. Do _1-2___ sets per session. Do __2__ sessions per day.  SINGLE LIMB STANCE    Stand at counter (corner if you have one) for minimal arm support. Raise leg. Hold _10-20__ seconds. Repeat with other leg. Once this becomes easier, for increased challenge, close eyes with fingertips on counter for safety. _3-5__ reps per set, _2-3__ sets per day.    Tandem Stance    Stand at counter (corner if you have one). Right foot in front of left, heel touching toe both feet "straight ahead". Stand on Foot Triangle of Support with both feet. Balance in this position _10-20__ seconds. Do with left foot in front of right. Once this becomes easier, for increased challenge, close eyes with fingertips on counter for safety.  Step-Up: Forward    Move step-stool to counter or hold rail if at steps, but as little as able. Step up forward keeping opposite foot off step. Keep pelvis level and back straight. Hold the single leg stand for 3 seconds, then come back down slowly.  Do _10__ times, do _1-2_ sets, on each leg, _1-2__ times per day.         HIP: Flexion Standing    Holding onto counter lift leg straight in front keeping knee straight. Slow and controlled! _10__ reps per set, _2-3__ sets per day. Then repeat with other leg.   Hip Extension (Standing)    Stand with support at counter. Squeeze pelvic floor and hold so as not to twist hips and don't lean forward.  Move right leg backward with straight knee. Slow and controlled! Repeat _10__ times. Do _2-3__ times a day. Repeat with other leg.  Hip Abduction (Standing)    Stand with support. Squeeze pelvic floor and hold. Lift right leg out to side, keeping toe forward.  Repeat _10__ times. Do _2-3__ times a day. Repeat with other leg.

## 2019-04-17 NOTE — Therapy (Addendum)
[place], [place], [address] Phone: [phone]   Fax:  [phone]  Physical Therapy Treatment  Patient Details  Name: Teresa Hays MRN: [id number] Date of Birth: 1955-12-08 Referring Provider (PT): [name]   Encounter Date: 04/17/2019  PT End of Session - 04/17/19 0854    Visit Number  2    Number of Visits  9    Date for PT Re-Evaluation  05/13/19    PT Start Time  0803    PT Stop Time  0849    PT Time Calculation (min)  46 min    Activity Tolerance  Patient tolerated treatment well    Behavior During Therapy  [facility] for tasks assessed/performed       Past Medical History:  Diagnosis Date  . GERD (gastroesophageal reflux disease)   . Hypertension   . rt breast ca with mets to liver dx'd 03/2018    Past Surgical History:  Procedure Laterality Date  . COLONOSCOPY    . ESOPHAGOGASTRODUODENOSCOPY ENDOSCOPY    . IR IMAGING GUIDED PORT INSERTION  04/04/2018    There were no vitals filed for this visit.  Subjective Assessment - 04/17/19 0805    Subjective  Nothing much new since I was here last.    Pertinent History  Metastatic right breast cancer to liver, cT2N1M1, stage IV, ER+/PR+/HER2+, Liver mets ER+/PR+/HER2-, HTN (controlled)    Currently in Pain?  No/denies   feet just always tingle        [facility] PT Assessment - 04/17/19 0001      Standardized Balance Assessment   Standardized Balance Assessment  Berg Balance Test      Berg Balance Test   Sit to Stand  Able to stand without using hands and stabilize independently    Standing Unsupported  Able to stand safely 2 minutes    Sitting with Back Unsupported but Feet Supported on Floor or Stool  Able to sit safely and securely 2 minutes    Stand to Sit  Sits safely with minimal use of hands    Transfers  Able to transfer safely, minor use of hands    Standing Unsupported with Eyes Closed  Able to stand 10 seconds safely     Standing Unsupported with Feet Together  Able to place feet together independently and stand 1 minute safely    From Standing, Reach Forward with Outstretched Arm  Can reach forward >12 cm safely (5")    From Standing Position, Pick up Object from Floor  Able to pick up shoe safely and easily    From Standing Position, Turn to Look Behind Over each Shoulder  Looks behind one side only/other side shows less weight shift   pt reports feeling lightheaded with this, Lt>Rt   Turn 360 Degrees  Able to turn 360 degrees safely but slowly   Rt 13.22 sec, Lt 12.55    Standing Unsupported, Alternately Place Feet on Step/Stool  Able to stand independently and safely and complete 8 steps in 20 seconds   11 sec   Standing Unsupported, One Foot in Front  Able to take small step independently and hold 30 seconds    Standing on One Leg  Tries to lift leg/unable to hold 3 seconds but remains standing independently   once holding bike with leg up can let go for up to 3-4 sec   Total Score  47  Outpatient Re[facility]13/[facility]  Lymphedema Life Impact Scale Total Score  26.47 %           [facility] Adult PT Treatment/Exercise - 04/17/19 0001      Neuro Re-ed    Neuro Re-ed Details   In //bars: Heel-toe front walking 2x with fingertips on bars, seated rest, then instructed pt in bil hip 3 way raises (flexion, abduction and extension) 5x each with demo and VCs for correct LE positioning throughout      Knee/Hip Exercises: Aerobic   Nustep  Level 4 x 5 mins (tried level 5 for about 1 min but became too challenging after about 1 min so went back to level 4)               PT Short Term Goals - 04/13/19 9242      PT SHORT TERM GOAL #1   Title  Pt will perform scenario 2 of the MCTSIB standing with eyes closed/feet together for 30 seconds without corret hip/ankle strategies to avoid stepping strategy within 4 weeks.    Baseline  Pt was  only able to complete scenario 1 standing in rhomber eyes open for 30 secodns and SBA.    Time  4    Period  Weeks    Status  New    Target Date  05/18/19        PT Long Term Goals - 04/13/19 6834      PT LONG TERM GOAL #1   Title  Pt will improve BERG balance score by 8 point within 8 weeks in order to demontsrate a decreased risk for falls.    Baseline  TBD    Time  8    Period  Weeks    Status  New    Target Date  06/15/19      PT LONG TERM GOAL #2   Title  Pt will be able to ambultae 0.5 miles within 8 weeks to demonstrate improved activity tolerance.    Baseline  1/3 mile measured by pt activity watch.    Time  8    Period  Weeks    Status  New    Target Date  06/15/19      PT LONG TERM GOAL #3   Title  Pt will report 4/10 pain in B hands and 5/10 pain in B feet following 8 weeks of physical therapy.    Baseline  Pain 6/10 hands, 7/10 feet    Time  8    Period  Weeks    Status  New    Target Date  06/15/19            Plan - 04/17/19 0855    Clinical Impression Statement  Tested pt with BERG balance today and she scored a 47. Higher level was most challenging for pt especially when head turns were involved (turning and looking over shoulder) caused pt to feel lighteaded. Began NuStep and some blance activities in // bars as time allowed. Pt was challenged by these activities and added them to HEP.    Personal Factors and Comorbidities  Comorbidity 2    Comorbidities  Metastatic Breast Cancer, Continued chemotherapy treatment    Examination-Activity Limitations  Stairs;Locomotion Level    Stability/Clinical Decision Making  Stable/Uncomplicated    Rehab Potential  Good    PT Frequency  1x / week    PT Duration  8 weeks    PT Treatment/Interventions  Gait training;Stair  training;Functional mobility training;Therapeutic activities;Therapeutic exercise;Balance training;Neuromuscular re-education;Patient/family education;Manual techniques;Manual lymph  drainage;Passive range of motion;Taping;Vasopneumatic Device    PT Next Visit Plan  Cont with NuStep working on increasing time; review HEP and cont high level balance activities, including head turns    PT Home Exercise Plan  Balance HEP and bil LE strength    Consulted and Agree with Plan of Care  Patient       Patient will benefit from skilled therapeutic intervention in order to improve the following deficits and impairments:  Difficulty walking, Decreased safety awareness, Decreased balance  Visit Diagnosis: Peripheral neuropathy due to chemotherapy [facility])  Metastatic breast cancer ([place])  Other abnormalities of gait and mobility     Problem List Patient Active Problem List   Diagnosis Date Noted  . Peripheral neuropathy due to chemotherapy ([place]) 04/06/2019  . Anemia due to chemotherapy 08/03/2018  . Nausea with vomiting 04/25/2018  . Port-A-Cath in place 04/21/2018  . Metastatic breast cancer ([place]) 03/30/2018  . Goals of care, counseling/discussion 03/30/2018  . Liver masses 03/16/2018    Otelia Limes, PTA 04/17/2019, 8:59 AM  [place], [place], [address] Phone: [phone]   Fax:  [phone]  Name: Teresa Hays MRN: [id number] Date of Birth: 03-22-1956  PHYSICAL THERAPY DISCHARGE SUMMARY  Visits from Start of Care: 2  Current functional level related to goals / functional outcomes: See above   Remaining deficits: See above   Education / Equipment: HEP Plan: Patient agrees to discharge.  Patient goals were not met. Patient is being discharged due to not returning since the last visit.  ?????     Allyson Sabal [place], [place] 07/03/19 10:22 AM

## 2019-04-20 ENCOUNTER — Other Ambulatory Visit: Payer: 59

## 2019-04-20 ENCOUNTER — Ambulatory Visit: Payer: 59

## 2019-04-27 ENCOUNTER — Inpatient Hospital Stay: Payer: 59

## 2019-04-27 ENCOUNTER — Inpatient Hospital Stay: Payer: 59 | Attending: Hematology

## 2019-04-27 ENCOUNTER — Ambulatory Visit: Payer: 59

## 2019-04-27 ENCOUNTER — Other Ambulatory Visit: Payer: 59

## 2019-04-27 ENCOUNTER — Other Ambulatory Visit: Payer: Self-pay

## 2019-04-27 VITALS — BP 162/91 | HR 60 | Temp 98.1°F | Resp 18

## 2019-04-27 DIAGNOSIS — Z7189 Other specified counseling: Secondary | ICD-10-CM

## 2019-04-27 DIAGNOSIS — D649 Anemia, unspecified: Secondary | ICD-10-CM | POA: Diagnosis not present

## 2019-04-27 DIAGNOSIS — Z170 Estrogen receptor positive status [ER+]: Secondary | ICD-10-CM | POA: Insufficient documentation

## 2019-04-27 DIAGNOSIS — Z95828 Presence of other vascular implants and grafts: Secondary | ICD-10-CM

## 2019-04-27 DIAGNOSIS — C787 Secondary malignant neoplasm of liver and intrahepatic bile duct: Secondary | ICD-10-CM | POA: Diagnosis not present

## 2019-04-27 DIAGNOSIS — I517 Cardiomegaly: Secondary | ICD-10-CM | POA: Diagnosis not present

## 2019-04-27 DIAGNOSIS — D3501 Benign neoplasm of right adrenal gland: Secondary | ICD-10-CM | POA: Insufficient documentation

## 2019-04-27 DIAGNOSIS — K8010 Calculus of gallbladder with chronic cholecystitis without obstruction: Secondary | ICD-10-CM | POA: Diagnosis not present

## 2019-04-27 DIAGNOSIS — R05 Cough: Secondary | ICD-10-CM | POA: Diagnosis not present

## 2019-04-27 DIAGNOSIS — Z86718 Personal history of other venous thrombosis and embolism: Secondary | ICD-10-CM | POA: Insufficient documentation

## 2019-04-27 DIAGNOSIS — N270 Small kidney, unilateral: Secondary | ICD-10-CM | POA: Insufficient documentation

## 2019-04-27 DIAGNOSIS — K5900 Constipation, unspecified: Secondary | ICD-10-CM | POA: Insufficient documentation

## 2019-04-27 DIAGNOSIS — Z5112 Encounter for antineoplastic immunotherapy: Secondary | ICD-10-CM | POA: Insufficient documentation

## 2019-04-27 DIAGNOSIS — Z7901 Long term (current) use of anticoagulants: Secondary | ICD-10-CM | POA: Insufficient documentation

## 2019-04-27 DIAGNOSIS — C50811 Malignant neoplasm of overlapping sites of right female breast: Secondary | ICD-10-CM | POA: Insufficient documentation

## 2019-04-27 DIAGNOSIS — R160 Hepatomegaly, not elsewhere classified: Secondary | ICD-10-CM | POA: Diagnosis not present

## 2019-04-27 DIAGNOSIS — R062 Wheezing: Secondary | ICD-10-CM | POA: Insufficient documentation

## 2019-04-27 DIAGNOSIS — Z79899 Other long term (current) drug therapy: Secondary | ICD-10-CM | POA: Diagnosis not present

## 2019-04-27 DIAGNOSIS — R590 Localized enlarged lymph nodes: Secondary | ICD-10-CM | POA: Insufficient documentation

## 2019-04-27 DIAGNOSIS — R0600 Dyspnea, unspecified: Secondary | ICD-10-CM | POA: Insufficient documentation

## 2019-04-27 DIAGNOSIS — I10 Essential (primary) hypertension: Secondary | ICD-10-CM | POA: Diagnosis not present

## 2019-04-27 DIAGNOSIS — K449 Diaphragmatic hernia without obstruction or gangrene: Secondary | ICD-10-CM | POA: Insufficient documentation

## 2019-04-27 DIAGNOSIS — K2100 Gastro-esophageal reflux disease with esophagitis, without bleeding: Secondary | ICD-10-CM | POA: Insufficient documentation

## 2019-04-27 DIAGNOSIS — Z79811 Long term (current) use of aromatase inhibitors: Secondary | ICD-10-CM | POA: Diagnosis not present

## 2019-04-27 DIAGNOSIS — C50919 Malignant neoplasm of unspecified site of unspecified female breast: Secondary | ICD-10-CM

## 2019-04-27 DIAGNOSIS — R918 Other nonspecific abnormal finding of lung field: Secondary | ICD-10-CM | POA: Insufficient documentation

## 2019-04-27 DIAGNOSIS — I700 Atherosclerosis of aorta: Secondary | ICD-10-CM | POA: Diagnosis not present

## 2019-04-27 DIAGNOSIS — Z6824 Body mass index (BMI) 24.0-24.9, adult: Secondary | ICD-10-CM | POA: Diagnosis not present

## 2019-04-27 DIAGNOSIS — G629 Polyneuropathy, unspecified: Secondary | ICD-10-CM | POA: Diagnosis not present

## 2019-04-27 LAB — CMP (CANCER CENTER ONLY)
ALT: 19 U/L (ref 0–44)
AST: 24 U/L (ref 15–41)
Albumin: 3.8 g/dL (ref 3.5–5.0)
Alkaline Phosphatase: 125 U/L (ref 38–126)
Anion gap: 11 (ref 5–15)
BUN: 15 mg/dL (ref 8–23)
CO2: 25 mmol/L (ref 22–32)
Calcium: 9.8 mg/dL (ref 8.9–10.3)
Chloride: 106 mmol/L (ref 98–111)
Creatinine: 1.08 mg/dL — ABNORMAL HIGH (ref 0.44–1.00)
GFR, Est AFR Am: >60 mL/min (ref >60)
GFR, Estimated: 55 mL/min — ABNORMAL LOW (ref >60)
Glucose, Bld: 111 mg/dL — ABNORMAL HIGH (ref 70–99)
Potassium: 3.6 mmol/L (ref 3.5–5.1)
Sodium: 142 mmol/L (ref 135–145)
Total Bilirubin: 0.3 mg/dL (ref 0.3–1.2)
Total Protein: 7.3 g/dL (ref 6.5–8.1)

## 2019-04-27 LAB — CBC WITH DIFFERENTIAL (CANCER CENTER ONLY)
Abs Immature Granulocytes: 0.01 10*3/uL (ref 0.00–0.07)
Basophils Absolute: 0.1 10*3/uL (ref 0.0–0.1)
Basophils Relative: 1 %
Eosinophils Absolute: 0.8 10*3/uL — ABNORMAL HIGH (ref 0.0–0.5)
Eosinophils Relative: 12 %
HCT: 33.1 % — ABNORMAL LOW (ref 36.0–46.0)
Hemoglobin: 10.8 g/dL — ABNORMAL LOW (ref 12.0–15.0)
Immature Granulocytes: 0 %
Lymphocytes Relative: 31 %
Lymphs Abs: 2.2 10*3/uL (ref 0.7–4.0)
MCH: 28.1 pg (ref 26.0–34.0)
MCHC: 32.6 g/dL (ref 30.0–36.0)
MCV: 86 fL (ref 80.0–100.0)
Monocytes Absolute: 0.3 10*3/uL (ref 0.1–1.0)
Monocytes Relative: 4 %
Neutro Abs: 3.8 10*3/uL (ref 1.7–7.7)
Neutrophils Relative %: 52 %
Platelet Count: 193 10*3/uL (ref 150–400)
RBC: 3.85 MIL/uL — ABNORMAL LOW (ref 3.87–5.11)
RDW: 14.5 % (ref 11.5–15.5)
WBC Count: 7.2 10*3/uL (ref 4.0–10.5)
nRBC: 0 % (ref 0.0–0.2)

## 2019-04-27 MED ORDER — SODIUM CHLORIDE 0.9% FLUSH
10.0000 mL | INTRAVENOUS | Status: DC | PRN
  Administered 2019-04-27: 10 mL
  Filled 2019-04-27: qty 10

## 2019-04-27 MED ORDER — DIPHENHYDRAMINE HCL 50 MG/ML IJ SOLN
INTRAMUSCULAR | Status: AC
  Filled 2019-04-27: qty 1

## 2019-04-27 MED ORDER — ACETAMINOPHEN 325 MG PO TABS
650.0000 mg | ORAL_TABLET | Freq: Once | ORAL | Status: AC
  Administered 2019-04-27: 650 mg via ORAL

## 2019-04-27 MED ORDER — SODIUM CHLORIDE 0.9 % IV SOLN
420.0000 mg | Freq: Once | INTRAVENOUS | Status: AC
  Administered 2019-04-27: 420 mg via INTRAVENOUS
  Filled 2019-04-27: qty 14

## 2019-04-27 MED ORDER — ACETAMINOPHEN 325 MG PO TABS
ORAL_TABLET | ORAL | Status: AC
  Filled 2019-04-27: qty 2

## 2019-04-27 MED ORDER — DIPHENHYDRAMINE HCL 50 MG/ML IJ SOLN
25.0000 mg | Freq: Once | INTRAMUSCULAR | Status: AC
  Administered 2019-04-27: 25 mg via INTRAVENOUS

## 2019-04-27 MED ORDER — SODIUM CHLORIDE 0.9 % IV SOLN
Freq: Once | INTRAVENOUS | Status: AC
  Administered 2019-04-27: 09:00:00 via INTRAVENOUS
  Filled 2019-04-27: qty 250

## 2019-04-27 MED ORDER — TRASTUZUMAB-DKST CHEMO 150 MG IV SOLR
6.0000 mg/kg | Freq: Once | INTRAVENOUS | Status: AC
  Administered 2019-04-27: 399 mg via INTRAVENOUS
  Filled 2019-04-27: qty 19

## 2019-04-27 MED ORDER — HEPARIN SOD (PORK) LOCK FLUSH 100 UNIT/ML IV SOLN
500.0000 [IU] | Freq: Once | INTRAVENOUS | Status: AC | PRN
  Administered 2019-04-27: 500 [IU]
  Filled 2019-04-27: qty 5

## 2019-04-27 MED FILL — AMLODIPINE BESYLATE 5 MG TA: 5 | 90 days supply | Qty: 90 | Fill #0

## 2019-04-27 NOTE — Patient Instructions (Signed)
[facility] Discharge Instructions for Patients Receiving Chemotherapy  Today you received the following chemotherapy agents Trastuzumab  & Pertuzumab .  To help prevent nausea and vomiting after your treatment, we encourage you to take your nausea medication as prescribed.   If you develop nausea and vomiting that is not controlled by your nausea medication, call the clinic.   BELOW ARE SYMPTOMS THAT SHOULD BE REPORTED IMMEDIATELY:  *FEVER GREATER THAN 100.5 F  *CHILLS WITH OR WITHOUT FEVER  NAUSEA AND VOMITING THAT IS NOT CONTROLLED WITH YOUR NAUSEA MEDICATION  *UNUSUAL SHORTNESS OF BREATH  *UNUSUAL BRUISING OR BLEEDING  TENDERNESS IN MOUTH AND THROAT WITH OR WITHOUT PRESENCE OF ULCERS  *URINARY PROBLEMS  *BOWEL PROBLEMS  UNUSUAL RASH Items with * indicate a potential emergency and should be followed up as soon as possible.  Feel free to call the clinic should you have any questions or concerns. The clinic phone number is [phone].  Please show the [facility] at check-in to the Emergency Department and triage nurse.

## 2019-04-29 MED FILL — LOSARTAN POTASSIUM 50 MG TA: 50 | 30 days supply | Qty: 30 | Fill #1

## 2019-04-30 ENCOUNTER — Other Ambulatory Visit: Payer: Self-pay | Admitting: Nurse Practitioner

## 2019-04-30 NOTE — Telephone Encounter (Signed)
Refill request

## 2019-05-01 ENCOUNTER — Other Ambulatory Visit: Payer: Self-pay | Admitting: Nurse Practitioner

## 2019-05-01 MED ORDER — TRAMADOL HCL 50 MG PO TABS: 50.0000 mg | ORAL_TABLET | Freq: Four times a day (QID) | ORAL | 0 refills | Status: DC | PRN

## 2019-05-01 MED FILL — traMADol HCL 50 MG TABS: 50 | 7 days supply | Qty: 30 | Fill #0

## 2019-05-02 ENCOUNTER — Encounter (HOSPITAL_COMMUNITY): Payer: 59 | Admitting: Internal Medicine

## 2019-05-02 ENCOUNTER — Other Ambulatory Visit (HOSPITAL_COMMUNITY): Payer: 59

## 2019-05-04 ENCOUNTER — Other Ambulatory Visit: Payer: Self-pay | Admitting: Hematology

## 2019-05-04 ENCOUNTER — Telehealth: Payer: Self-pay | Admitting: *Deleted

## 2019-05-04 MED ORDER — HYDROCOD POLST-CPM POLST ER 10-8 MG/5ML PO SUER: 5.0000 mL | Freq: Every evening | ORAL | 0 refills | Status: DC | PRN

## 2019-05-04 MED FILL — HYDROCODONE-CHLORPHEN ER SU: 10-8 | 23 days supply | Qty: 115 | Fill #0

## 2019-05-04 NOTE — Telephone Encounter (Signed)
Notified of message below. Verbalized understanding 

## 2019-05-04 NOTE — Telephone Encounter (Signed)
I refilled cough syrup for her, let her call back if she develops fever or productive cough with thick sputum, may consider antibiotic then.   [name] MD

## 2019-05-04 NOTE — Telephone Encounter (Signed)
Pt reports that she is "having the same cough and congestion" as she had a few months. No fever, no nausea or vomiting. Productive cough- "like a cold".

## 2019-05-07 MED FILL — LETROZOLE 2.5 MG TABLET: 2.5 | 90 days supply | Qty: 90 | Fill #1

## 2019-05-07 NOTE — Progress Notes (Signed)
[place]   [phone] [phone]   Clinic Follow up Note   Patient Care Team: Levin Bacon, NP as PCP - General (Family Medicine) [name], MD as Consulting Physician (Gastroenterology) [name], MD as Consulting Physician (Hematology)  Date of Service:  05/17/2019  CHIEF COMPLAINT: F/u for metastatic breast cancer  SUMMARY OF ONCOLOGIC HISTORY: Oncology History Overview Note  Cancer Staging Metastatic breast cancer [facility]) Staging form: Breast, AJCC 8th Edition - Clinical stage from 03/24/2018: Stage IV (cT2, cN1, pM1, G3, ER+, PR+, HER2+) - Signed by [name], MD on 03/30/2018     Metastatic breast cancer ([place])  01/30/2018 Procedure   Colonoscopy showed small polyp in the sigmoid colon, removed, the exam of colon including the terminal ileum was otherwise negative.   01/30/2018 Procedure   EGD by Dr. Collene Mares showed small hiatal hernia, a 8 mm polypoid lesion in the cardia, biopsied.   03/09/2018 Imaging   03/09/2018 US Abdomen IMPRESSION: 1. Mass lesions throughout the liver, consistent with metastatic disease. Liver as a somewhat nodular contour suggesting underlying hepatic cirrhosis. Inhomogeneous echotexture to the liver.  2. Cholelithiasis with mild gallbladder wall thickening. A degree of cholecystitis cannot be excluded by ultrasound.  3. Portions of pancreas obscured by gas. Visualized portions of pancreas appear normal.  4. Small right kidney. Etiology uncertain. This finding potentially may be indicative of renal artery stenosis. In this regard, question whether patient is hypertensive.   03/15/2018 Imaging   CT CAP with contrast  IMPRESSION: 1. Widespread hepatic metastasis. 2. 2.6 cm lateral right breast soft tissue nodule could represent a breast primary or an incidental benign lesion. Consider correlation with mammogram and ultrasound. 3. No definite source of primary malignancy identified within the abdomen or  pelvis. There is possible rectosigmoid junction wall thickening. Consider colonoscopy with attention to this area. 4. Distal esophageal wall thickening, suggesting esophagitis.   03/24/2018 Cancer Staging   Staging form: Breast, AJCC 8th Edition - Clinical stage from 03/24/2018: Stage IV (cT2, cN1, pM1, G3, ER+, PR+, HER2+) - Signed by [name], MD on 03/30/2018   03/24/2018 Initial Biopsy   Diagnosis 1. 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 Breast, right, needle core biopsy, 9 o'clock, 7cm from nipple - INVASIVE DUCTAL CARCINOMA. -The carcinoma is somewhat morphologically dissimilar from that in part 1. It appears grade III 3. Lymph node, needle/core biopsy, right axillary - METASTATIC CARCINOMA IN 1 OF 1 LYMPH NODE (1/1).   03/24/2018 Receptors her2   Breast biopsy: 1. Estrogen Receptor: 40%, POSITIVE, STRONG-MODERATE STAINING INTENSITY Progesterone Receptor: 70%, POSITIVE, STRONG STAINING INTENSITY Proliferation Marker Ki67: 20% HER 2 equivocal by IHC 2+, POSITIVE by FISH, ratio 2.4 and copy #4.2  2. Estrogen Receptor: 60%, POSITIVE, MODERATE STAINING INTENSITY Progesterone Receptor: 40%, POSITIVE, MODERATE STAINING INTENSITY Proliferation Marker Ki67: 20% HER2 (+) by IHC 3+   03/24/2018 Initial Diagnosis   Metastatic breast cancer ([place])   03/27/2018 Pathology Results   Diagnosis Liver, needle/core biopsy, Right - METASTATIC CARCINOMA TO LIVER, CONSISTENT WITH PATIENTS CLINICAL HISTORY OF PRIMARY BREAST CARCINOMA.  ER 80%+ PR40%+ HER2- (by [facility], IHC 2+)  Ki67 50%    03/28/2018 Pathology Results   03/28/2018 Surgical Pathology Diagnosis 1. Breast, left, needle core biopsy, 9 o'clock - FIBROCYSTIC CHANGES. - THERE IS NO EVIDENCE OF MALIGNANCY. 2. Breast, left, needle core biopsy, 2 o'clock - FIBROADENOMA. - THERE IS NO EVIDENCE OF  MALIGNANCY. - SEE COMMENT.   03/29/2018 Imaging    03/29/2018 Bone Scan IMPRESSION: No scintigraphic evidence of osseous metastatic disease.   03/30/2018 Imaging   Bone scan  IMPRESSION: No scintigraphic evidence of osseous metastatic disease.    04/07/2018 -  Chemotherapy   First line chemo weekly Taxol and herceptin/Perjeta every 3 weeks starting 04/07/18. She developed infusion reaction to taxol and it was discontinued. Added Abraxane on C1D8, 2 weeks on/1 week off.  Abraxne stopped after 10/13/18 due to worsening Neuropathy   06/06/2018 Imaging   CT CAP IMPRESSION: 1. Generally improved appearance, with reduced axillary adenopathy and reduced enhancing component of the hepatic masses, with some of the hepatic mass is moderately smaller than on the prior exam. Reduced size of the right lateral breast mass compared to the prior 03/15/2018 exam. 2. New mild interstitial accentuation in the lungs, significance uncertain. Part of this appearance may be due to lower lung volumes on today's exam. 3. Mild wall thickening in the descending colon and upper rectum suggesting low-grade colitis/inflammation. Prominent stool throughout the colon favors constipation. 4. Other imaging findings of potential clinical significance: Aortic Atherosclerosis (ICD10-I70.0). Mild cardiomegaly. Mild nodularity in the right lower lobe appears stable. Contracted and thick-walled gallbladder.    09/18/2018 Imaging   CT CAP W Contrast 09/18/18  IMPRESSION: 1. Liver metastases have decreased in size. 2. Right breast mass, mild right axillary lymphadenopathy and scattered tiny right pulmonary nodules are all stable. 3. New mild left supraclavicular and left subpectoral lymphadenopathy, can not exclude progression of metastatic nodal disease. 4. Moderate colorectal stool volume, which may indicate constipation. 5.  Aortic Atherosclerosis (ICD10-I70.0).   10/2018 -  Anti-estrogen oral therapy   Letrozole 2.5 mg daily starting 10/2018   01/10/2019 Imaging   CT CAP W  Contrast 01/10/19  IMPRESSION: 1. Continued improvement in the hepatic metastatic lesions which have reduced in size. 2. Stable mild left supraclavicular and subpectoral adenopathy. 3. Essentially stable small right lower lobe pulmonary nodule and separate small subpleural nodule along the right hemidiaphragm. Surveillance suggested. 4. Other imaging findings of potential clinical significance: Mild cardiomegaly. Mild circumferential distal esophageal wall thickening, the most common cause would be esophagitis. Airway thickening is present, suggesting bronchitis or reactive airways disease. Airway plugging in the lower lobes and in the right middle lobe. Stable small right adrenal adenoma.   05/15/2019 Imaging   CT CAP W Contrast 05/15/19  IMPRESSION: CT CHEST IMPRESSION   1. Similar appearance of borderline supraclavicular, axillary, and subpectoral adenopathy. 2. Improved and resolved right lower lobe pulmonary nodularity. 3. Esophageal air fluid level suggests dysmotility or gastroesophageal reflux.   CT ABDOMEN AND PELVIS IMPRESSION   1. Improved hepatic metastasis. 2. Small bowel mesenteric lymph nodes which are upper normal and mildly enlarged. Likely increased and similar as detailed above. Indeterminate. Recommend attention on follow-up. 3. Cholelithiasis. 4. Motion degradation throughout the lower chest and abdomen.      CURRENT THERAPY:  -First line weekly TaxolwithHerceptin/Perjeta every 3 weeks starting 04/07/18. She developed infusion reaction to taxol and it was discontinued. Changed to Abraxaneweekly2 weeks on/1 week offfrom 07/21/2018.Abraxane held after October 13, 2018 due to his worsening neuropathy. -Letrozole 2.5 mg daily starting 10/2018  INTERVAL HISTORY:  Teresa Hays is here for a follow up and treatment. She presents to the clinic with family member. She notes she has been wheezing at night more. She is also coughing up yellowish phlegm. She  denies presence of blood. She notes the last time this  happened 1 month ago. She denies fever at home. She denies this worsens when she lays flat. She notes her right LE edema is stable. I reviewed her medication list. She is taking Gabapentin but still does not feel it is enough. She notes she takes Tramadol every other night. She is also still taking Eliquis. She is still tolerating Letrozole.    REVIEW OF SYSTEMS:   Constitutional: Denies fevers, chills or abnormal weight loss Eyes: Denies blurriness of vision Ears, nose, mouth, throat, and face: Denies mucositis or sore throat Respiratory: Denies dyspnea (+) wheezes (+) Cough with yellow phlegm Cardiovascular: Denies palpitation, chest discomfort (+) Right lower extremity swelling stable Gastrointestinal:  Denies nausea, heartburn or change in bowel habits Skin: Denies abnormal skin rashes Lymphatics: Denies new lymphadenopathy or easy bruising Neurological:Denies numbness, tingling or new weaknesses Behavioral/Psych: Mood is stable, no new changes  All other systems were reviewed with the patient and are negative.  MEDICAL HISTORY:  Past Medical History:  Diagnosis Date   GERD (gastroesophageal reflux disease)    Hypertension    rt breast ca with mets to liver dx'd 03/2018    SURGICAL HISTORY: Past Surgical History:  Procedure Laterality Date   COLONOSCOPY     ESOPHAGOGASTRODUODENOSCOPY ENDOSCOPY     IR IMAGING GUIDED PORT INSERTION  04/04/2018    I have reviewed the social history and family history with the patient and they are unchanged from previous note.  ALLERGIES:  has No Known Allergies.  MEDICATIONS:  Current Outpatient Medications  Medication Sig Dispense Refill   albuterol (VENTOLIN HFA) 108 (90 Base) MCG/ACT inhaler Inhale 2 puffs into the lungs every 6 (six) hours as needed for wheezing or shortness of breath. 6.7 g 1   amLODipine (NORVASC) 5 MG tablet Take 5 mg by mouth daily.      carvedilol (COREG)  3.125 MG tablet Take 1 tablet (3.125 mg total) by mouth 2 (two) times daily. 180 tablet 3   chlorpheniramine-HYDROcodone (TUSSIONEX) 10-8 MG/5ML SUER Take 5 mLs by mouth at bedtime as needed for cough. 115 mL 0   diphenoxylate-atropine (LOMOTIL) 2.5-0.025 MG tablet Take 2 tablets by mouth 4 (four) times daily as needed for diarrhea or loose stools. (Patient not taking: Reported on 04/13/2019) 30 tablet 0   DULoxetine (CYMBALTA) 20 MG capsule Take 1 capsule (20 mg total) by mouth daily. 30 capsule 1   fluticasone (FLONASE) 50 MCG/ACT nasal spray Place 2 sprays into both nostrils 2 (two) times daily as needed for allergies or rhinitis. (Patient not taking: Reported on 04/13/2019) 16 g 0   gabapentin (NEURONTIN) 300 MG capsule Patient is to take one 300 mg capsule in am, one 300 mg capsule midday and three 300 mg capsules at bedtime. 150 capsule 3   letrozole (FEMARA) 2.5 MG tablet Take 1 tablet (2.5 mg total) by mouth daily. 90 tablet 1   lidocaine-prilocaine (EMLA) cream Apply to affected area once 30 g 3   LORazepam (ATIVAN) 0.5 MG tablet Take 1 tablet (0.5 mg total) by mouth at bedtime as needed for anxiety. 30 tablet 0   losartan (COZAAR) 50 MG tablet Take 1 tablet (50 mg total) by mouth daily. 90 tablet 3   omeprazole (PRILOSEC) 20 MG capsule TAKE 1 CAPSULE BY MOUTH ONCE DAILY 30 capsule 2   ondansetron (ZOFRAN) 8 MG tablet Take 1 tablet (8 mg total) by mouth 2 (two) times daily as needed (Nausea or vomiting). (Patient not taking: Reported on 04/13/2019) 30 tablet 1   potassium  chloride SA (KLOR-CON) 20 MEQ tablet TAKE 1 TABLET BY MOUTH ONCE DAILY 30 tablet 0   traMADol (ULTRAM) 50 MG tablet Take 1 tablet (50 mg total) by mouth every 6 (six) hours as needed for moderate pain or severe pain. 30 tablet 0   XARELTO 20 MG TABS tablet TAKE 1 TABLET (20 MG TOTAL) BY MOUTH DAILY WITH SUPPER. 30 tablet 5   No current facility-administered medications for this visit.    Facility-Administered  Medications Ordered in Other Visits  Medication Dose Route Frequency Provider Last Rate Last Dose   sodium chloride flush (NS) 0.9 % injection 10 mL  10 mL Intracatheter PRN [name], MD   10 mL at 05/17/19 1400    PHYSICAL EXAMINATION: ECOG PERFORMANCE STATUS: 1 - Symptomatic but completely ambulatory  Vitals:   05/17/19 1007  BP: (!) 143/87  Pulse: 70  Resp: 17  Temp: 98 F (36.7 C)  SpO2: 100%   Filed Weights   05/17/19 1007  Weight: 144 lb 8 oz (65.5 kg)    GENERAL:alert, no distress and comfortable SKIN: skin color, texture, turgor are normal, no rashes or significant lesions EYES: normal, Conjunctiva are pink and non-injected, sclera clear  NECK: supple, thyroid normal size, non-tender, without nodularity LYMPH:  no palpable lymphadenopathy in the cervical, axillary  LUNGS: clear to auscultation and percussion (+) B/l wheezing  HEART: regular rate & rhythm and no murmurs (+) Minimal right lower extremity edema ABDOMEN:abdomen soft, non-tender and normal bowel sounds Musculoskeletal:no cyanosis of digits and no clubbing  NEURO: alert & oriented x 3 with fluent speech, no focal motor/sensory deficits  LABORATORY DATA:  I have reviewed the data as listed CBC Latest Ref Rng & Units 05/17/2019 04/27/2019 04/06/2019  WBC 4.0 - 10.5 K/uL 6.5 7.2 5.9  Hemoglobin 12.0 - 15.0 g/dL 10.9(L) 10.8(L) 11.1(L)  Hematocrit 36.0 - 46.0 % 33.4(L) 33.1(L) 34.5(L)  Platelets 150 - 400 K/uL 198 193 213     CMP Latest Ref Rng & Units 05/17/2019 04/27/2019 04/06/2019  Glucose 70 - 99 mg/dL 123(H) 111(H) 104(H)  BUN 8 - 23 mg/dL _0 Creatinine 0.44 - 1.00 mg/dL 1.14(H) 1.08(H) 1.11(H)  Sodium 135 - 145 mmol/L 139 142 140  Potassium 3.5 - 5.1 mmol/L 3.4(L) 3.6 3.9  Chloride 98 - 111 mmol/L 104 106 105  CO2 22 - 32 mmol/L _1 Calcium 8.9 - 10.3 mg/dL 9.6 9.8 9.6  Total Protein 6.5 - 8.1 g/dL 7.3 7.3 7.4  Total Bilirubin 0.3 - 1.2 mg/dL 0.3 0.3 0.2(L)  Alkaline Phos 38 - 126  U/L 147(H) 125 119  AST 15 - 41 U/L _2 ALT 0 - 44 U/L _3 RADIOGRAPHIC STUDIES: I have personally reviewed the radiological images as listed and agreed with the findings in the report. No results found.   ASSESSMENT & PLAN:  EMMANUELLA MIRANTE is a 63 y.o. female with   1. Metastatic right breast cancer to liver, cT2N1M1, stage IV, ER+/PR+/HER2+, Liver mets ER+/PR+/HER2- -She was diagnosed in 03/2018.She presented with diffuse liver metastasis, tworight breast massesand right axillary adenopathy. Breast mass biopsy showed ER PR positive, but 1 was HER-2 positive, the other one was HER-2 negative. Liver met was HER2-.  -Given the metastatic disease, her cancer is not curable but still treatable. Shehas been treated withFirst-line Herceptin/Perjetaevery 3 weeks and weekly Taxol. Due to infusionreaction, taxol was switched to Abraxanefor 2 weeks on/1 week off.Tolerating well.  We stoppedAbraxaneafter cycle 10 due to worsening neuropathy and continued with Herceptin/Perjeta.  -Her right breast mass is no longer palpable on exam, she continues to show good response. -She has startedanti-estrogen therapy with [facility] and is tolerating well.Will continue letrozole daily and Herceptin/Perjeta every 3 weeks for as long as it controls her disease. She is agreeable. -I personally reviewed and discussed her CT CAP from 05/15/19 with pt and his daughter which shows stable adenopathy of chest, improved and resolved right lung nodule, improved liver metastasis. Overall she has had good response to treatment, will continue for as long as this controls her disease.  -We again discussed the incurable nature of her breast cancer but we have many treatment options.  I briefly discussed that we have two more Her2 antibodies which were recently approved by FDA, she has more treatment options in the future   -Labs reviewed, CBC and CMP WNL except Hg 10.9, Cr 1.14. Ca  27.29 still pending. Overall adequate to proceed with Herceptin/Perjeta today  -Continue Letrozole  -F/u in 6 weeks   2.Peripheral neuropathy, grade 2 -Secondarytochemotherapy,mainly in her feet -Ipreviouslystropped Abraxane on 10/13/18. -Her neuropathy in her feet has not improved is very significant at night and mostly normal during the day. The neuropathy in her hands is more constant now. (04/06/19). Ambulation during the day is mostly adequate. -Her 04/06/19 B12 level was normal at 450.  -She is currently on Oral B12, Gabapentin to 979m at bed time, she will continue 3053min am and pm which moderately helps.  -She also takes Tramadol every other night currently, not at the same time as Gabapentin.  -She has tried PT twice and does not feel it helps as much as she does not feel like a fall risk. I encouraged her to go 1-2 times more and if not helpful she is fine to stop.  -I discussed adding Cymbalta to help her tingling pain. She is interested in trying. Will start at low dose 2048maily (05/17/19). If still not enough I may refer her to neurologist Dr, VasMickeal Skinner  4.Left UEDVT, Right LE edema -she developed LUE DVT on 08/04/2018, edema has resolved after anticoagulation -Continue onXarelto 21m64mdefinitely, unless she has significant bleeding or other side effects. -Shepreviouslydeveloped LE edema, right significantly more than left.10/13/18 doppler was negative for DVT.  -Ipreviouslyput her on 1 week dose of Lasix and potassium -Her right LE edema has improved and now minimal on exam today (05/17/19)  5. Anemia -she developed worseninganemia since she started chemotherapy, improved since she came off chemo -Continue monitoring, will consider blood transfusion if hemoglobin less than 8, always symptomatic anemia. -stable, hg at10.1today (05/17/19)   6Social support  -She lives alone, [place]  -On Ativan PRN due to anxietyabout diagnosis and treatment  7. Goal of care discussion  -she is full code -She understands the goal of care is palliative, to prolong her life, and improve her quality of life.  8. Recurrent dyspnea and wheezing -She has been having recurrent dyspnea and wheezing lately. She also has cough with yellow phlegm. No fever or chills. She has b/l wheezing on exam today (05/17/19), but CT of lung 2 days ago was negative  -She has never smoked, no history of asthma or COPD -I refilled her inhaler prescription (05/17/19). I also recommend she see a Pulmonologist. She is agreeable.    Plan: -CT scan reviewed, shows  good response  -I called in Cymbalta 78m daily for her peripheral neuropathy and refilled her albuterol inhaler and Gabapentin today  -Labs reviewed and adequate to proceed with Herceptin/Perjeta today -continue letrozole -lab, flush, and Herceptin/Perjeta in 3, 6, 9 weeks on Fridays  -F/u in 6 weeks    No problem-specific Assessment & Plan notes found for this encounter.   Orders Placed This Encounter  Procedures   Ambulatory referral to Pulmonology    Referral Priority:   Routine    Referral Type:   Consultation    Referral Reason:   Specialty Services Required    Requested Specialty:   Pulmonary Disease    Number of Visits Requested:   1   All questions were answered. The patient knows to call the clinic with any problems, questions or concerns. No barriers to learning was detected. I spent 20 minutes counseling the patient face to face. The total time spent in the appointment was 25 minutes and more than 50% was on counseling and review of test results     YTruitt Merle MD 05/17/2019   I, AJoslyn Devon am acting as scribe for YTruitt Merle MD.   I have reviewed the above documentation for accuracy and completeness, and I agree with the above.

## 2019-05-14 ENCOUNTER — Other Ambulatory Visit: Payer: Self-pay | Admitting: Hematology

## 2019-05-14 DIAGNOSIS — C787 Secondary malignant neoplasm of liver and intrahepatic bile duct: Secondary | ICD-10-CM

## 2019-05-14 DIAGNOSIS — C50919 Malignant neoplasm of unspecified site of unspecified female breast: Secondary | ICD-10-CM

## 2019-05-14 MED FILL — XARELTO 20 MG TABLET: 20 | 30 days supply | Qty: 30 | Fill #0

## 2019-05-14 MED FILL — POTASSIUM CHLORIDE CRYS ER: 20 | 30 days supply | Qty: 30 | Fill #0

## 2019-05-14 MED FILL — OMEPRAZOLE 20 MG CAP: 20 | 30 days supply | Qty: 30 | Fill #1

## 2019-05-14 NOTE — Telephone Encounter (Signed)
Refill request

## 2019-05-15 ENCOUNTER — Ambulatory Visit: Payer: 59

## 2019-05-15 ENCOUNTER — Other Ambulatory Visit: Payer: Self-pay

## 2019-05-15 ENCOUNTER — Encounter (HOSPITAL_COMMUNITY): Payer: Self-pay

## 2019-05-15 ENCOUNTER — Ambulatory Visit (HOSPITAL_COMMUNITY)
Admission: RE | Admit: 2019-05-15 | Discharge: 2019-05-15 | Disposition: A | Discharge status: Home / Self Care | Payer: 59 | Source: Ambulatory Visit | Attending: Hematology | Admitting: Hematology

## 2019-05-15 DIAGNOSIS — C50919 Malignant neoplasm of unspecified site of unspecified female breast: Secondary | ICD-10-CM | POA: Insufficient documentation

## 2019-05-15 DIAGNOSIS — C787 Secondary malignant neoplasm of liver and intrahepatic bile duct: Secondary | ICD-10-CM | POA: Diagnosis not present

## 2019-05-15 DIAGNOSIS — Z5111 Encounter for antineoplastic chemotherapy: Secondary | ICD-10-CM | POA: Diagnosis not present

## 2019-05-15 MED ORDER — IOHEXOL 300 MG/ML  SOLN
100.0000 mL | Freq: Once | INTRAMUSCULAR | Status: AC | PRN
  Administered 2019-05-15: 100 mL via INTRAVENOUS

## 2019-05-15 MED ORDER — SODIUM CHLORIDE (PF) 0.9 % IJ SOLN
INTRAMUSCULAR | Status: AC
  Filled 2019-05-15: qty 50

## 2019-05-17 ENCOUNTER — Inpatient Hospital Stay: Payer: 59

## 2019-05-17 ENCOUNTER — Encounter: Payer: Self-pay | Admitting: Hematology

## 2019-05-17 ENCOUNTER — Inpatient Hospital Stay (HOSPITAL_BASED_OUTPATIENT_CLINIC_OR_DEPARTMENT_OTHER): Payer: 59 | Admitting: Hematology

## 2019-05-17 ENCOUNTER — Other Ambulatory Visit: Payer: Self-pay

## 2019-05-17 VITALS — BP 143/87 | HR 70 | Temp 98.0°F | Resp 17 | Ht 64.0 in | Wt 144.5 lb

## 2019-05-17 VITALS — BP 140/92 | HR 69 | Temp 98.7°F | Resp 18

## 2019-05-17 DIAGNOSIS — K449 Diaphragmatic hernia without obstruction or gangrene: Secondary | ICD-10-CM | POA: Diagnosis not present

## 2019-05-17 DIAGNOSIS — K8010 Calculus of gallbladder with chronic cholecystitis without obstruction: Secondary | ICD-10-CM | POA: Diagnosis not present

## 2019-05-17 DIAGNOSIS — G620 Drug-induced polyneuropathy: Secondary | ICD-10-CM

## 2019-05-17 DIAGNOSIS — N270 Small kidney, unilateral: Secondary | ICD-10-CM | POA: Diagnosis not present

## 2019-05-17 DIAGNOSIS — C50919 Malignant neoplasm of unspecified site of unspecified female breast: Secondary | ICD-10-CM

## 2019-05-17 DIAGNOSIS — C787 Secondary malignant neoplasm of liver and intrahepatic bile duct: Secondary | ICD-10-CM | POA: Diagnosis not present

## 2019-05-17 DIAGNOSIS — D3501 Benign neoplasm of right adrenal gland: Secondary | ICD-10-CM | POA: Diagnosis not present

## 2019-05-17 DIAGNOSIS — Z95828 Presence of other vascular implants and grafts: Secondary | ICD-10-CM

## 2019-05-17 DIAGNOSIS — Z5112 Encounter for antineoplastic immunotherapy: Secondary | ICD-10-CM | POA: Diagnosis not present

## 2019-05-17 DIAGNOSIS — C50811 Malignant neoplasm of overlapping sites of right female breast: Secondary | ICD-10-CM | POA: Diagnosis not present

## 2019-05-17 DIAGNOSIS — T451X5A Adverse effect of antineoplastic and immunosuppressive drugs, initial encounter: Secondary | ICD-10-CM | POA: Diagnosis not present

## 2019-05-17 DIAGNOSIS — Z170 Estrogen receptor positive status [ER+]: Secondary | ICD-10-CM | POA: Diagnosis not present

## 2019-05-17 DIAGNOSIS — Z7189 Other specified counseling: Secondary | ICD-10-CM

## 2019-05-17 DIAGNOSIS — I700 Atherosclerosis of aorta: Secondary | ICD-10-CM | POA: Diagnosis not present

## 2019-05-17 DIAGNOSIS — D6481 Anemia due to antineoplastic chemotherapy: Secondary | ICD-10-CM | POA: Diagnosis not present

## 2019-05-17 LAB — CMP (CANCER CENTER ONLY)
ALT: 25 U/L (ref 0–44)
AST: 24 U/L (ref 15–41)
Albumin: 3.8 g/dL (ref 3.5–5.0)
Alkaline Phosphatase: 147 U/L — ABNORMAL HIGH (ref 38–126)
Anion gap: 11 (ref 5–15)
BUN: 16 mg/dL (ref 8–23)
CO2: 24 mmol/L (ref 22–32)
Calcium: 9.6 mg/dL (ref 8.9–10.3)
Chloride: 104 mmol/L (ref 98–111)
Creatinine: 1.14 mg/dL — ABNORMAL HIGH (ref 0.44–1.00)
GFR, Est AFR Am: 60 mL/min — ABNORMAL LOW (ref >60)
GFR, Estimated: 51 mL/min — ABNORMAL LOW (ref >60)
Glucose, Bld: 123 mg/dL — ABNORMAL HIGH (ref 70–99)
Potassium: 3.4 mmol/L — ABNORMAL LOW (ref 3.5–5.1)
Sodium: 139 mmol/L (ref 135–145)
Total Bilirubin: 0.3 mg/dL (ref 0.3–1.2)
Total Protein: 7.3 g/dL (ref 6.5–8.1)

## 2019-05-17 LAB — CBC WITH DIFFERENTIAL (CANCER CENTER ONLY)
Abs Immature Granulocytes: 0 10*3/uL (ref 0.00–0.07)
Basophils Absolute: 0.1 10*3/uL (ref 0.0–0.1)
Basophils Relative: 1 %
Eosinophils Absolute: 0.9 10*3/uL — ABNORMAL HIGH (ref 0.0–0.5)
Eosinophils Relative: 14 %
HCT: 33.4 % — ABNORMAL LOW (ref 36.0–46.0)
Hemoglobin: 10.9 g/dL — ABNORMAL LOW (ref 12.0–15.0)
Immature Granulocytes: 0 %
Lymphocytes Relative: 29 %
Lymphs Abs: 1.9 10*3/uL (ref 0.7–4.0)
MCH: 28.2 pg (ref 26.0–34.0)
MCHC: 32.6 g/dL (ref 30.0–36.0)
MCV: 86.3 fL (ref 80.0–100.0)
Monocytes Absolute: 0.3 10*3/uL (ref 0.1–1.0)
Monocytes Relative: 5 %
Neutro Abs: 3.4 10*3/uL (ref 1.7–7.7)
Neutrophils Relative %: 51 %
Platelet Count: 198 10*3/uL (ref 150–400)
RBC: 3.87 MIL/uL (ref 3.87–5.11)
RDW: 15 % (ref 11.5–15.5)
WBC Count: 6.5 10*3/uL (ref 4.0–10.5)
nRBC: 0 % (ref 0.0–0.2)

## 2019-05-17 MED ORDER — SODIUM CHLORIDE 0.9% FLUSH
10.0000 mL | INTRAVENOUS | Status: DC | PRN
  Administered 2019-05-17: 10 mL
  Filled 2019-05-17: qty 10

## 2019-05-17 MED ORDER — ACETAMINOPHEN 325 MG PO TABS
650.0000 mg | ORAL_TABLET | Freq: Once | ORAL | Status: AC
  Administered 2019-05-17: 650 mg via ORAL

## 2019-05-17 MED ORDER — TRASTUZUMAB-DKST CHEMO 150 MG IV SOLR
6.0000 mg/kg | Freq: Once | INTRAVENOUS | Status: AC
  Administered 2019-05-17: 399 mg via INTRAVENOUS
  Filled 2019-05-17: qty 19

## 2019-05-17 MED ORDER — DULOXETINE HCL 20 MG PO CPEP: 20.0000 mg | ORAL_CAPSULE | Freq: Every day | ORAL | 1 refills | Status: DC

## 2019-05-17 MED ORDER — GABAPENTIN 300 MG PO CAPS: ORAL_CAPSULE | ORAL | 3 refills | Status: DC

## 2019-05-17 MED ORDER — ALBUTEROL SULFATE HFA 108 (90 BASE) MCG/ACT IN AERS: 2.0000 | INHALATION_SPRAY | Freq: Four times a day (QID) | RESPIRATORY_TRACT | 1 refills | Status: DC | PRN

## 2019-05-17 MED ORDER — SODIUM CHLORIDE 0.9% FLUSH
10.0000 mL | INTRAVENOUS | Status: DC | PRN
  Administered 2019-05-17: 10:00:00 10 mL
  Filled 2019-05-17: qty 10

## 2019-05-17 MED ORDER — HEPARIN SOD (PORK) LOCK FLUSH 100 UNIT/ML IV SOLN
500.0000 [IU] | Freq: Once | INTRAVENOUS | Status: AC | PRN
  Administered 2019-05-17: 500 [IU]
  Filled 2019-05-17: qty 5

## 2019-05-17 MED ORDER — SODIUM CHLORIDE 0.9 % IV SOLN
420.0000 mg | Freq: Once | INTRAVENOUS | Status: AC
  Administered 2019-05-17: 420 mg via INTRAVENOUS
  Filled 2019-05-17: qty 14

## 2019-05-17 MED ORDER — DIPHENHYDRAMINE HCL 50 MG/ML IJ SOLN
INTRAMUSCULAR | Status: AC
  Filled 2019-05-17: qty 1

## 2019-05-17 MED ORDER — ACETAMINOPHEN 325 MG PO TABS
ORAL_TABLET | ORAL | Status: AC
  Filled 2019-05-17: qty 2

## 2019-05-17 MED ORDER — SODIUM CHLORIDE 0.9 % IV SOLN
Freq: Once | INTRAVENOUS | Status: AC
  Administered 2019-05-17: 11:00:00 via INTRAVENOUS
  Filled 2019-05-17: qty 250

## 2019-05-17 MED ORDER — DIPHENHYDRAMINE HCL 50 MG/ML IJ SOLN
25.0000 mg | Freq: Once | INTRAMUSCULAR | Status: AC
  Administered 2019-05-17: 25 mg via INTRAVENOUS

## 2019-05-17 MED FILL — DULOXETINE HCL 20 MG CPEP: 20 | 30 days supply | Qty: 30 | Fill #0

## 2019-05-17 MED FILL — ALBUTEROL SULFATE HFA 108 (: 108 (90 BAS | 25 days supply | Qty: 18 | Fill #0

## 2019-05-17 MED FILL — GABAPENTIN 300 MG CAPSULE: 300 | 30 days supply | Qty: 150 | Fill #0

## 2019-05-17 NOTE — Patient Instructions (Signed)
[place] Discharge Instructions for Patients Receiving Chemotherapy  Today you received the following chemotherapy agents Trastuzumab  & Pertuzumab .  To help prevent nausea and vomiting after your treatment, we encourage you to take your nausea medication as prescribed.   If you develop nausea and vomiting that is not controlled by your nausea medication, call the clinic.   BELOW ARE SYMPTOMS THAT SHOULD BE REPORTED IMMEDIATELY:  *FEVER GREATER THAN 100.5 F  *CHILLS WITH OR WITHOUT FEVER  NAUSEA AND VOMITING THAT IS NOT CONTROLLED WITH YOUR NAUSEA MEDICATION  *UNUSUAL SHORTNESS OF BREATH  *UNUSUAL BRUISING OR BLEEDING  TENDERNESS IN MOUTH AND THROAT WITH OR WITHOUT PRESENCE OF ULCERS  *URINARY PROBLEMS  *BOWEL PROBLEMS  UNUSUAL RASH Items with * indicate a potential emergency and should be followed up as soon as possible.  Feel free to call the clinic should you have any questions or concerns. The clinic phone number is [phone].  Please show the [facility] at check-in to the Emergency Department and triage nurse.

## 2019-05-17 NOTE — Patient Instructions (Signed)

## 2019-05-18 LAB — CANCER ANTIGEN 27.29: CA 27.29: 52.5 U/mL — ABNORMAL HIGH (ref 0.0–38.6)

## 2019-05-20 MED FILL — CARVEDILOL 3.125 MG TABLET: 3.125 | 90 days supply | Qty: 180 | Fill #1

## 2019-05-28 MED FILL — LOSARTAN POTASSIUM 50 MG TA: 50 | 30 days supply | Qty: 30 | Fill #2

## 2019-05-29 ENCOUNTER — Ambulatory Visit (HOSPITAL_COMMUNITY)
Admission: RE | Admit: 2019-05-29 | Discharge: 2019-05-29 | Disposition: A | Discharge status: Home / Self Care | Payer: 59 | Source: Ambulatory Visit | Attending: Internal Medicine | Admitting: Internal Medicine

## 2019-05-29 ENCOUNTER — Other Ambulatory Visit: Payer: Self-pay

## 2019-05-29 ENCOUNTER — Ambulatory Visit (HOSPITAL_BASED_OUTPATIENT_CLINIC_OR_DEPARTMENT_OTHER)
Admission: RE | Admit: 2019-05-29 | Discharge: 2019-05-29 | Disposition: A | Discharge status: Home / Self Care | Payer: 59 | Source: Ambulatory Visit | Attending: Internal Medicine | Admitting: Internal Medicine

## 2019-05-29 ENCOUNTER — Ambulatory Visit: Payer: 59

## 2019-05-29 VITALS — BP 166/102 | HR 69 | Wt 140.6 lb

## 2019-05-29 DIAGNOSIS — Z79899 Other long term (current) drug therapy: Secondary | ICD-10-CM | POA: Insufficient documentation

## 2019-05-29 DIAGNOSIS — K219 Gastro-esophageal reflux disease without esophagitis: Secondary | ICD-10-CM | POA: Insufficient documentation

## 2019-05-29 DIAGNOSIS — Z79811 Long term (current) use of aromatase inhibitors: Secondary | ICD-10-CM | POA: Diagnosis not present

## 2019-05-29 DIAGNOSIS — I82621 Acute embolism and thrombosis of deep veins of right upper extremity: Secondary | ICD-10-CM | POA: Diagnosis not present

## 2019-05-29 DIAGNOSIS — Z809 Family history of malignant neoplasm, unspecified: Secondary | ICD-10-CM | POA: Insufficient documentation

## 2019-05-29 DIAGNOSIS — R062 Wheezing: Secondary | ICD-10-CM | POA: Diagnosis not present

## 2019-05-29 DIAGNOSIS — I5032 Chronic diastolic (congestive) heart failure: Secondary | ICD-10-CM

## 2019-05-29 DIAGNOSIS — Z7901 Long term (current) use of anticoagulants: Secondary | ICD-10-CM | POA: Insufficient documentation

## 2019-05-29 DIAGNOSIS — I700 Atherosclerosis of aorta: Secondary | ICD-10-CM | POA: Insufficient documentation

## 2019-05-29 DIAGNOSIS — Z833 Family history of diabetes mellitus: Secondary | ICD-10-CM | POA: Insufficient documentation

## 2019-05-29 DIAGNOSIS — R0600 Dyspnea, unspecified: Secondary | ICD-10-CM | POA: Diagnosis not present

## 2019-05-29 DIAGNOSIS — I340 Nonrheumatic mitral (valve) insufficiency: Secondary | ICD-10-CM | POA: Insufficient documentation

## 2019-05-29 DIAGNOSIS — I10 Essential (primary) hypertension: Secondary | ICD-10-CM

## 2019-05-29 DIAGNOSIS — C50919 Malignant neoplasm of unspecified site of unspecified female breast: Secondary | ICD-10-CM

## 2019-05-29 DIAGNOSIS — C787 Secondary malignant neoplasm of liver and intrahepatic bile duct: Secondary | ICD-10-CM | POA: Insufficient documentation

## 2019-05-29 DIAGNOSIS — C7981 Secondary malignant neoplasm of breast: Secondary | ICD-10-CM | POA: Insufficient documentation

## 2019-05-29 LAB — BASIC METABOLIC PANEL
Anion gap: 9 (ref 5–15)
BUN: 13 mg/dL (ref 8–23)
CO2: 27 mmol/L (ref 22–32)
Calcium: 10.2 mg/dL (ref 8.9–10.3)
Chloride: 104 mmol/L (ref 98–111)
Creatinine, Ser: 1.04 mg/dL — ABNORMAL HIGH (ref 0.44–1.00)
GFR calc Af Amer: >60 mL/min (ref >60)
GFR calc non Af Amer: 58 mL/min — ABNORMAL LOW (ref >60)
Glucose, Bld: 95 mg/dL (ref 70–99)
Potassium: 4.1 mmol/L (ref 3.5–5.1)
Sodium: 140 mmol/L (ref 135–145)

## 2019-05-29 LAB — BRAIN NATRIURETIC PEPTIDE: B Natriuretic Peptide: 9.3 pg/mL (ref 0.0–100.0)

## 2019-05-29 MED ORDER — FUROSEMIDE 20 MG PO TABS
20.0000 mg | ORAL_TABLET | Freq: Once | ORAL | Status: DC
  Filled 2019-05-29 (×4): qty 1

## 2019-05-29 MED ORDER — SPIRONOLACTONE 25 MG PO TABS: 25.0000 mg | ORAL_TABLET | Freq: Every day | ORAL | 6 refills | Status: DC

## 2019-05-29 MED ORDER — POTASSIUM CHLORIDE CRYS ER 20 MEQ PO TBCR
20.0000 meq | EXTENDED_RELEASE_TABLET | Freq: Once | ORAL | Status: DC
  Filled 2019-05-29 (×4): qty 1

## 2019-05-29 MED FILL — SPIRONOLACTONE 25 MG TABS: 25 | 30 days supply | Qty: 30 | Fill #0

## 2019-05-29 NOTE — Progress Notes (Signed)
Cardio-Oncology Clinic Note    Date:  05/29/2019   ID:  Teresa Hays, Bryand 1955-10-22, MRN [id number]  Location: Home  Provider location: [facility] Type of Visit: New patient  PCP:  Levin Bacon, NP  Cardiologist:  No primary care provider on file.  Referring: Dr. Burr Medico   History of Present Illness:  Ashely is a 63 y/o woman with GERD, HTN and metastatic breast CA referred by Dr. Burr Medico for enrollment into the Cardio-Oncology program.   SUMMARY OF ONCOLOGIC HISTORY:     Oncology History   Cancer Staging Metastatic breast cancer [facility]) Staging form: Breast, AJCC 8th Edition - Clinical stage from 03/24/2018: Stage IV (cT2, cN1, pM1, G3, ER+, PR+, HER2+) - Signed by [name], MD on 03/30/2018       Metastatic breast cancer (Teresa Hays)   01/30/2018 Procedure    Colonoscopy showed small polyp in the sigmoid colon, removed, the exam of colon including the terminal ileum was otherwise negative.    01/30/2018 Procedure    EGD by Dr. Collene Mares showed small hiatal hernia, a 8 mm polypoid lesion in the cardia, biopsied.    03/09/2018 Imaging    03/09/2018 US Abdomen IMPRESSION: 1. Mass lesions throughout the liver, consistent with metastatic disease. Liver as a somewhat nodular contour suggesting underlying hepatic cirrhosis. Inhomogeneous echotexture to the liver.  2. Cholelithiasis with mild gallbladder wall thickening. A degree of cholecystitis cannot be excluded by ultrasound.  3. Portions of pancreas obscured by gas. Visualized portions of pancreas appear normal.  4. Small right kidney. Etiology uncertain. This finding potentially may be indicative of renal artery stenosis. In this regard, question whether patient is hypertensive.    03/15/2018 Imaging    CT CAP with contrast  IMPRESSION: 1. Widespread hepatic metastasis. 2. 2.6 cm lateral right breast soft tissue nodule could represent a breast primary or an incidental  benign lesion. Consider correlation with mammogram and ultrasound. 3. No definite source of primary malignancy identified within the abdomen or pelvis. There is possible rectosigmoid junction wall thickening. Consider colonoscopy with attention to this area. 4. Distal esophageal wall thickening, suggesting esophagitis.    03/24/2018 Cancer Staging    Staging form: Breast, AJCC 8th Edition - Clinical stage from 03/24/2018: Stage IV (cT2, cN1, pM1, G3, ER+, PR+, HER2+) - Signed by [name], MD on 03/30/2018    03/24/2018 Initial Biopsy    Diagnosis 1. 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 Breast, right, needle core biopsy, 9 o'clock, 7cm from nipple - INVASIVE DUCTAL CARCINOMA. -The carcinoma is somewhat morphologically dissimilar from that in part 1. It appears grade III 3. Lymph node, needle/core biopsy, right axillary - METASTATIC CARCINOMA IN 1 OF 1 LYMPH NODE (1/1).    03/24/2018 Receptors her2    Breast biopsy: 1. Estrogen Receptor: 40%, POSITIVE, STRONG-MODERATE STAINING INTENSITY Progesterone Receptor: 70%, POSITIVE, STRONG STAINING INTENSITY Proliferation Marker Ki67: 20% HER 2 equivocal by IHC 2+, POSITIVE by FISH, ratio 2.4 and copy #4.2  2. Estrogen Receptor: 60%, POSITIVE, MODERATE STAINING INTENSITY Progesterone Receptor: 40%, POSITIVE, MODERATE STAINING INTENSITY Proliferation Marker Ki67: 20% HER2 (+) by IHC 3+    03/24/2018 Initial Diagnosis    Metastatic breast cancer ([place])    03/27/2018 Pathology Results    Diagnosis Liver, needle/core biopsy, Right - METASTATIC CARCINOMA TO LIVER, CONSISTENT WITH PATIENTS CLINICAL HISTORY OF PRIMARY BREAST CARCINOMA.  ER 80%+  PR40%+ HER2- (by [facility], IHC 2+)  Ki67 50%     03/28/2018 Pathology Results    03/28/2018 Surgical Pathology Diagnosis 1. Breast, left, needle core biopsy, 9 o'clock - FIBROCYSTIC CHANGES.  - THERE IS NO EVIDENCE OF MALIGNANCY. 2. Breast, left, needle core biopsy, 2 o'clock - FIBROADENOMA. - THERE IS NO EVIDENCE OF MALIGNANCY. - SEE COMMENT.    03/29/2018 Imaging    03/29/2018 Bone Scan IMPRESSION: No scintigraphic evidence of osseous metastatic disease.    03/30/2018 Imaging    Bone scan  IMPRESSION: No scintigraphic evidence of osseous metastatic disease.     04/07/2018 -  Chemotherapy    First line chemo weekly Taxol and herceptin/Perjeta every 3 weeks starting 04/07/18. She developed infusion reaction to taxol and it was discontinued. Added Abraxane on C1D8, 2 weeks on/1 week off.  Abraxne stopped after 10/13/18 due to worsening Neuropathy    06/06/2018 Imaging    CT CAP IMPRESSION: 1. Generally improved appearance, with reduced axillary adenopathy and reduced enhancing component of the hepatic masses, with some of the hepatic mass is moderately smaller than on the prior exam. Reduced size of the right lateral breast mass compared to the prior 03/15/2018 exam. 2. New mild interstitial accentuation in the lungs, significance uncertain. Part of this appearance may be due to lower lung volumes on today's exam. 3. Mild wall thickening in the descending colon and upper rectum suggesting low-grade colitis/inflammation. Prominent stool throughout the colon favors constipation. 4. Other imaging findings of potential clinical significance: Aortic Atherosclerosis (ICD10-I70.0). Mild cardiomegaly. Mild nodularity in the right lower lobe appears stable. Contracted and thick-walled gallbladder.     09/18/2018 Imaging    CT CAP W Contrast 09/18/18  IMPRESSION: 1. Liver metastases have decreased in size. 2. Right breast mass, mild right axillary lymphadenopathy and scattered tiny right pulmonary nodules are all stable. 3. New mild left supraclavicular and left subpectoral lymphadenopathy, can not exclude progression of metastatic nodal disease. 4. Moderate  colorectal stool volume, which may indicate constipation. 5. Aortic Atherosclerosis (ICD10-I70.0).    10/2018 -  Anti-estrogen oral therapy    Letrozole 2.5 mg daily starting 10/2018     She presents today for routine f/u. She has underwent first-line weekly TaxolwithHerceptin/Perjeta every 3 weeks starting 04/07/18. She developed infusion reaction to taxol and it was discontinued. Changed to Abraxaneweekly2 weeks on/1 week offfrom 07/21/2018.Abraxane held after October 13, 2018 due to his worsening neuropathy. Now on Letrozole 2.5 mg daily starting 10/2018. Remains on Herceptin/Perjeta.We had a telehealth visit in August and EF felt to be mildly reduced at 45-50%. Herceptin/perjeta held for one dose. Now on losartan and carvedilol.   Doing well tolerating herceptin/perjeta. Says BP has been high. Running 140-160 at [facility]. Says she started wheezing about 2 weeks ago. Says she has had it before but it went away. Wheezing worse at night. No fevers or chills. Has had swelling in RLE. U/s negative for DVT   Echo today EF 55% GLS -17.9% Personally reviewed   Echo 9/20 EF 50-55% GLS - 14.4% (underestimated)  Echo 02/27/19 EF 50% GLS -21.4%   Echo 07/28/18 EF 60-65% GLS -19.2% Echo 12/1018  EF 55-60% GLS -16.1%     Past Medical History:  Diagnosis Date  . GERD (gastroesophageal reflux disease)   . Hypertension   . rt breast ca with mets to liver dx'd 03/2018   Past Surgical History:  Procedure Laterality Date  . COLONOSCOPY    . ESOPHAGOGASTRODUODENOSCOPY ENDOSCOPY    . IR IMAGING  GUIDED PORT INSERTION  04/04/2018     Current Outpatient Medications  Medication Sig Dispense Refill  . albuterol (VENTOLIN HFA) 108 (90 Base) MCG/ACT inhaler Inhale 2 puffs into the lungs every 6 (six) hours as needed for wheezing or shortness of breath. 6.7 g 1  . amLODipine (NORVASC) 5 MG tablet Take 5 mg by mouth daily.     . carvedilol (COREG) 3.125 MG tablet Take 1 tablet (3.125 mg total)  by mouth 2 (two) times daily. 180 tablet 3  . chlorpheniramine-HYDROcodone (TUSSIONEX) 10-8 MG/5ML SUER Take 5 mLs by mouth at bedtime as needed for cough. 115 mL 0  . DULoxetine (CYMBALTA) 20 MG capsule Take 1 capsule (20 mg total) by mouth daily. 30 capsule 1  . fluticasone (FLONASE) 50 MCG/ACT nasal spray Place 2 sprays into both nostrils 2 (two) times daily as needed for allergies or rhinitis. 16 g 0  . gabapentin (NEURONTIN) 300 MG capsule Patient is to take one 300 mg capsule in am, one 300 mg capsule midday and three 300 mg capsules at bedtime. 150 capsule 3  . letrozole (FEMARA) 2.5 MG tablet Take 1 tablet (2.5 mg total) by mouth daily. 90 tablet 1  . lidocaine-prilocaine (EMLA) cream Apply to affected area once 30 g 3  . LORazepam (ATIVAN) 0.5 MG tablet Take 1 tablet (0.5 mg total) by mouth at bedtime as needed for anxiety. 30 tablet 0  . losartan (COZAAR) 50 MG tablet Take 1 tablet (50 mg total) by mouth daily. 90 tablet 3  . omeprazole (PRILOSEC) 20 MG capsule TAKE 1 CAPSULE BY MOUTH ONCE DAILY 30 capsule 2  . potassium chloride SA (KLOR-CON) 20 MEQ tablet TAKE 1 TABLET BY MOUTH ONCE DAILY 30 tablet 0  . traMADol (ULTRAM) 50 MG tablet Take 1 tablet (50 mg total) by mouth every 6 (six) hours as needed for moderate pain or severe pain. 30 tablet 0  . XARELTO 20 MG TABS tablet TAKE 1 TABLET (20 MG TOTAL) BY MOUTH DAILY WITH SUPPER. 30 tablet 5   No current facility-administered medications for this encounter.     Allergies:   Patient has no known allergies.   Social History:  The patient  reports that she has never smoked. She has never used smokeless tobacco. She reports that she does not drink alcohol or use drugs.   Family History:  The patient's family history includes Cancer (age of onset: 38) in her sister; Diabetes in her mother; Rheum arthritis in her sister.   ROS:  Please see the history of present illness.   All other systems are personally reviewed and negative.      Vitals:   05/29/19 0934  BP: (!) 166/102  Pulse: 69  SpO2: 97%  Weight: 63.8 kg (140 lb 9.6 oz)     Exam:   General:  Well appearing. No resp difficulty HEENT: normal Neck: supple. JVP 9-10. Carotids 2+ bilat; no bruits. No lymphadenopathy or thryomegaly appreciated. Cor: PMI nondisplaced. Regular rate & rhythm. No rubs, gallops or murmurs. Lungs: clear mild end-expiratory wheeze Abdomen: obese soft, nontender, nondistended. No hepatosplenomegaly. No bruits or masses. Good bowel sounds. Extremities: no cyanosis, clubbing, rash, edema Neuro: alert & orientedx3, cranial nerves grossly intact. moves all 4 extremities w/o difficulty. Affect pleasant    Recent Labs: 05/17/2019: ALT 25; BUN 16; Creatinine 1.14; Hemoglobin 10.9; Platelet Count 198; Potassium 3.4; Sodium 139  Personally reviewed   Wt Readings from Last 3 Encounters:  05/29/19 63.8 kg (140 lb 9.6 oz)  05/17/19 65.5 kg (144 lb 8 oz)  04/06/19 62.9 kg (138 lb 11.2 oz)      ASSESSMENT AND PLAN:  1. Breast cancer, metastatic - She has underwent first-line weekly TaxolwithHerceptin/Perjeta every 3 weeks starting 04/07/18. She developed infusion reaction to taxol and it was discontinued. Changed to Abraxaneweekly2 weeks on/1 week offfrom 07/21/2018.Abraxane held after October 13, 2018 due to his worsening neuropathy. Remains on Herceptin/Perjeta. Now on Letrozole 2.5 mg daily starting 10/2018 - Echo 8/20 EF down to 45-50% suspicious for mild Herceptin cardiotoxicity.  - Herceptin/perjeta held  for one dose.  - Echo 9/20 EF 50-55%  - Echo today (05/29/19) EF stable at 55% GLS -17.9 % - Continue carvedilol 3.125 bid - Continue losartan '50mg'$  daily - Continue herceptin/perjeta with close f/u as ideally will need life-long therapy - Repeat echo 3 months   2. HTN markedly elevated with mild volume overload - Continue losartan/carvedilol  - Add spiro 25  3. Wheezing - suspect volume overload/acute diastolic HF in  setting of severe HTN - REDs = 36% - start spiro 25 - check CXR to confirm no other process  4. RUE DVT - continue Xarelto.    Glori Bickers, MD  05/29/2019 9:52 AM  [facility] [place] [address] [phone] (office) [phone] (fax)

## 2019-05-29 NOTE — Progress Notes (Signed)
Echocardiogram 2D Echocardiogram has been performed.  Oneal Deputy Antonique Langford 05/29/2019, 8:54 AM

## 2019-05-29 NOTE — Patient Instructions (Addendum)
Take Furosemide 20 mg and Potassium 20 meq when you get home today  Start Spironolactone 25 mg daily  Labs done today, we will notify you of abnormal readings  Please get labs done in 1 week  Chest x-ray today  Your physician recommends that you schedule a follow-up appointment in: 3 months with echcardiogram  If you have any questions or concerns before your next appointment please send [place] a message through [place] or call our office at [phone].  At the [facility], you and your health needs are our priority. As part of our continuing mission to provide you with exceptional heart care, we have created designated Provider Care Teams. These Care Teams include your primary Cardiologist (physician) and Advanced Practice Providers (APPs- Physician Assistants and Nurse Practitioners) who all work together to provide you with the care you need, when you need it.   You may see any of the following providers on your designated Care Team at your next follow up: Marland Kitchen Dr Glori Bickers . Dr Loralie Champagne . Darrick Grinder, NP . Lyda Jester, PA   Please be sure to bring in all your medications bottles to every appointment.

## 2019-05-29 NOTE — Progress Notes (Signed)
ReDS Vest / Clip - 05/29/19 1000      ReDS Vest / Clip   Station Marker  A    Ruler Value  23.5    ReDS Value  Moderate volume overload

## 2019-06-04 ENCOUNTER — Other Ambulatory Visit (HOSPITAL_COMMUNITY): Payer: 59

## 2019-06-04 ENCOUNTER — Encounter (HOSPITAL_COMMUNITY): Payer: 59 | Admitting: Internal Medicine

## 2019-06-05 NOTE — Addendum Note (Signed)
Encounter addended by: Scarlette Calico, RN on: 06/05/2019 12:10 PM  Actions taken: Order list changed, Diagnosis association updated

## 2019-06-08 ENCOUNTER — Ambulatory Visit (INDEPENDENT_AMBULATORY_CARE_PROVIDER_SITE_OTHER): Payer: 59 | Admitting: Pulmonary Disease

## 2019-06-08 ENCOUNTER — Inpatient Hospital Stay: Payer: 59

## 2019-06-08 ENCOUNTER — Other Ambulatory Visit: Payer: Self-pay

## 2019-06-08 ENCOUNTER — Inpatient Hospital Stay: Payer: 59 | Attending: Hematology

## 2019-06-08 VITALS — BP 147/90 | HR 98 | Temp 99.1°F | Resp 18

## 2019-06-08 VITALS — BP 134/88 | HR 69 | Temp 98.4°F | Ht 64.0 in | Wt 140.6 lb

## 2019-06-08 DIAGNOSIS — C50919 Malignant neoplasm of unspecified site of unspecified female breast: Secondary | ICD-10-CM

## 2019-06-08 DIAGNOSIS — C787 Secondary malignant neoplasm of liver and intrahepatic bile duct: Secondary | ICD-10-CM | POA: Diagnosis not present

## 2019-06-08 DIAGNOSIS — R05 Cough: Secondary | ICD-10-CM | POA: Insufficient documentation

## 2019-06-08 DIAGNOSIS — Z95828 Presence of other vascular implants and grafts: Secondary | ICD-10-CM

## 2019-06-08 DIAGNOSIS — Z7189 Other specified counseling: Secondary | ICD-10-CM

## 2019-06-08 DIAGNOSIS — R040 Epistaxis: Secondary | ICD-10-CM | POA: Diagnosis not present

## 2019-06-08 DIAGNOSIS — Z7901 Long term (current) use of anticoagulants: Secondary | ICD-10-CM | POA: Diagnosis not present

## 2019-06-08 DIAGNOSIS — D6481 Anemia due to antineoplastic chemotherapy: Secondary | ICD-10-CM | POA: Diagnosis not present

## 2019-06-08 DIAGNOSIS — M7989 Other specified soft tissue disorders: Secondary | ICD-10-CM | POA: Insufficient documentation

## 2019-06-08 DIAGNOSIS — T451X5A Adverse effect of antineoplastic and immunosuppressive drugs, initial encounter: Secondary | ICD-10-CM | POA: Insufficient documentation

## 2019-06-08 DIAGNOSIS — J988 Other specified respiratory disorders: Secondary | ICD-10-CM | POA: Diagnosis not present

## 2019-06-08 DIAGNOSIS — R062 Wheezing: Secondary | ICD-10-CM | POA: Insufficient documentation

## 2019-06-08 DIAGNOSIS — C773 Secondary and unspecified malignant neoplasm of axilla and upper limb lymph nodes: Secondary | ICD-10-CM | POA: Insufficient documentation

## 2019-06-08 DIAGNOSIS — Z86718 Personal history of other venous thrombosis and embolism: Secondary | ICD-10-CM | POA: Diagnosis not present

## 2019-06-08 DIAGNOSIS — Z5112 Encounter for antineoplastic immunotherapy: Secondary | ICD-10-CM | POA: Diagnosis not present

## 2019-06-08 DIAGNOSIS — Z79899 Other long term (current) drug therapy: Secondary | ICD-10-CM | POA: Insufficient documentation

## 2019-06-08 DIAGNOSIS — G620 Drug-induced polyneuropathy: Secondary | ICD-10-CM | POA: Insufficient documentation

## 2019-06-08 DIAGNOSIS — R911 Solitary pulmonary nodule: Secondary | ICD-10-CM | POA: Diagnosis not present

## 2019-06-08 DIAGNOSIS — G893 Neoplasm related pain (acute) (chronic): Secondary | ICD-10-CM | POA: Insufficient documentation

## 2019-06-08 DIAGNOSIS — C50811 Malignant neoplasm of overlapping sites of right female breast: Secondary | ICD-10-CM | POA: Insufficient documentation

## 2019-06-08 DIAGNOSIS — K219 Gastro-esophageal reflux disease without esophagitis: Secondary | ICD-10-CM | POA: Diagnosis not present

## 2019-06-08 LAB — CMP (CANCER CENTER ONLY)
ALT: 15 U/L (ref 0–44)
AST: 22 U/L (ref 15–41)
Albumin: 4 g/dL (ref 3.5–5.0)
Alkaline Phosphatase: 135 U/L — ABNORMAL HIGH (ref 38–126)
Anion gap: 9 (ref 5–15)
BUN: 16 mg/dL (ref 8–23)
CO2: 24 mmol/L (ref 22–32)
Calcium: 9.4 mg/dL (ref 8.9–10.3)
Chloride: 104 mmol/L (ref 98–111)
Creatinine: 1.17 mg/dL — ABNORMAL HIGH (ref 0.44–1.00)
GFR, Est AFR Am: 58 mL/min — ABNORMAL LOW
GFR, Estimated: 50 mL/min — ABNORMAL LOW
Glucose, Bld: 100 mg/dL — ABNORMAL HIGH (ref 70–99)
Potassium: 4.3 mmol/L (ref 3.5–5.1)
Sodium: 137 mmol/L (ref 135–145)
Total Bilirubin: 0.3 mg/dL (ref 0.3–1.2)
Total Protein: 7.6 g/dL (ref 6.5–8.1)

## 2019-06-08 LAB — CBC WITH DIFFERENTIAL (CANCER CENTER ONLY)
Abs Immature Granulocytes: 0.01 K/uL (ref 0.00–0.07)
Basophils Absolute: 0.1 K/uL (ref 0.0–0.1)
Basophils Relative: 1 %
Eosinophils Absolute: 0.8 K/uL — ABNORMAL HIGH (ref 0.0–0.5)
Eosinophils Relative: 12 %
HCT: 33.6 % — ABNORMAL LOW (ref 36.0–46.0)
Hemoglobin: 11.2 g/dL — ABNORMAL LOW (ref 12.0–15.0)
Immature Granulocytes: 0 %
Lymphocytes Relative: 36 %
Lymphs Abs: 2.5 K/uL (ref 0.7–4.0)
MCH: 28.8 pg (ref 26.0–34.0)
MCHC: 33.3 g/dL (ref 30.0–36.0)
MCV: 86.4 fL (ref 80.0–100.0)
Monocytes Absolute: 0.4 K/uL (ref 0.1–1.0)
Monocytes Relative: 5 %
Neutro Abs: 3.1 K/uL (ref 1.7–7.7)
Neutrophils Relative %: 46 %
Platelet Count: 181 K/uL (ref 150–400)
RBC: 3.89 MIL/uL (ref 3.87–5.11)
RDW: 14.7 % (ref 11.5–15.5)
WBC Count: 6.8 K/uL (ref 4.0–10.5)
nRBC: 0 % (ref 0.0–0.2)

## 2019-06-08 MED ORDER — SODIUM CHLORIDE 0.9% FLUSH
10.0000 mL | INTRAVENOUS | Status: DC | PRN
  Administered 2019-06-08: 08:00:00 10 mL
  Filled 2019-06-08: qty 10

## 2019-06-08 MED ORDER — HEPARIN SOD (PORK) LOCK FLUSH 100 UNIT/ML IV SOLN
500.0000 [IU] | Freq: Once | INTRAVENOUS | Status: AC | PRN
  Administered 2019-06-08: 500 [IU]
  Filled 2019-06-08: qty 5

## 2019-06-08 MED ORDER — SODIUM CHLORIDE 0.9% FLUSH
10.0000 mL | INTRAVENOUS | Status: DC | PRN
  Administered 2019-06-08: 10 mL
  Filled 2019-06-08: qty 10

## 2019-06-08 MED ORDER — DIPHENHYDRAMINE HCL 50 MG/ML IJ SOLN
25.0000 mg | Freq: Once | INTRAMUSCULAR | Status: AC
  Administered 2019-06-08: 25 mg via INTRAVENOUS

## 2019-06-08 MED ORDER — ACETAMINOPHEN 325 MG PO TABS
ORAL_TABLET | ORAL | Status: AC
  Filled 2019-06-08: qty 2

## 2019-06-08 MED ORDER — SODIUM CHLORIDE 0.9 % IV SOLN
420.0000 mg | Freq: Once | INTRAVENOUS | Status: AC
  Administered 2019-06-08: 420 mg via INTRAVENOUS
  Filled 2019-06-08: qty 14

## 2019-06-08 MED ORDER — ACETAMINOPHEN 325 MG PO TABS
650.0000 mg | ORAL_TABLET | Freq: Once | ORAL | Status: AC
  Administered 2019-06-08: 650 mg via ORAL

## 2019-06-08 MED ORDER — DIPHENHYDRAMINE HCL 50 MG/ML IJ SOLN
INTRAMUSCULAR | Status: AC
  Filled 2019-06-08: qty 1

## 2019-06-08 MED ORDER — TRASTUZUMAB-DKST CHEMO 150 MG IV SOLR
6.0000 mg/kg | Freq: Once | INTRAVENOUS | Status: AC
  Administered 2019-06-08: 399 mg via INTRAVENOUS
  Filled 2019-06-08: qty 19

## 2019-06-08 MED ORDER — SODIUM CHLORIDE 0.9 % IV SOLN
Freq: Once | INTRAVENOUS | Status: AC
  Administered 2019-06-08: 10:00:00 via INTRAVENOUS
  Filled 2019-06-08: qty 250

## 2019-06-08 NOTE — Patient Instructions (Signed)
Thank you for visiting Dr. Valeta Harms at [facility]. Today we recommend the following:  Sinus rinse samples Nasal saline spray at least twice daily but can use as needed  Omeprazole go to twice per day 30 mins before meals  Return if symptoms worsen or fail to improve.    Please do your part to reduce the spread of COVID-19.

## 2019-06-08 NOTE — Progress Notes (Signed)
Synopsis: Referred in November 2020 for ?wheezing by Dr. Burr Medico, PCP: Levin Bacon, NP  Subjective:   PATIENT ID: Teresa Hays GENDER: female DOB: Mar 17, 1956, MRN: [id number]  Chief Complaint  Patient presents with  . Consult    Consult for SOB and wheezing. Reports she has wheezing at night. Denies SOB.     PMH breast cancer, metastatic in 2019, currently on trastuzumab plus Pertuzumab.  She presents today to the office with complaints of questionable wheezing.  This is predominantly seen at nighttime.  She is accompanied today in the office by her daughter.  She does not really describe shortness of breath with exertion but she is fatigued because of everything that is going on and treatments currently for her chemo for cancer.  The noise that they hear is usually at nighttime while she is sleeping.  And when the patient is asked to describe she is able to recreate the noise through forced exhalation.  She does not necessarily have chest symptoms during this time.  Patient denies any significant allergies.  She denies hemoptysis.  She does state that her nasal passages have been extremely dry and she always uses a space heater.  She occasionally has bloody noses because of this.  Patient was given an albuterol inhaler.  She states when she uses this it does not make any difference in her symptoms.  Patient also is experiencing cough and gastroesophageal reflux symptoms.    Past Medical History:  Diagnosis Date  . GERD (gastroesophageal reflux disease)   . Hypertension   . rt breast ca with mets to liver dx'd 03/2018     Family History  Problem Relation Age of Onset  . Diabetes Mother   . Cancer Sister 63       breast cancer  . Rheum arthritis Sister      Past Surgical History:  Procedure Laterality Date  . COLONOSCOPY    . ESOPHAGOGASTRODUODENOSCOPY ENDOSCOPY    . IR IMAGING GUIDED PORT INSERTION  04/04/2018    Social History   Socioeconomic History  . Marital status:  Single    Spouse name: Not on file  . Number of children: Not on file  . Years of education: Not on file  . Highest education level: Not on file  Occupational History  . Not on file  Social Needs  . Financial resource strain: Not on file  . Food insecurity    Worry: Not on file    Inability: Not on file  . Transportation needs    Medical: Not on file    Non-medical: Not on file  Tobacco Use  . Smoking status: Never Smoker  . Smokeless tobacco: Never Used  Substance and Sexual Activity  . Alcohol use: No  . Drug use: Never  . Sexual activity: Not on file  Lifestyle  . Physical activity    Days per week: Not on file    Minutes per session: Not on file  . Stress: Not on file  Relationships  . Social Herbalist on phone: Not on file    Gets together: Not on file    Attends religious service: Not on file    Active member of club or organization: Not on file    Attends meetings of clubs or organizations: Not on file    Relationship status: Not on file  . Intimate partner violence    Fear of current or ex partner: Not on file    Emotionally abused: Not  on file    Physically abused: Not on file    Forced sexual activity: Not on file  Other Topics Concern  . Not on file  Social History Narrative  . Not on file     No Known Allergies   Outpatient Medications Prior to Visit  Medication Sig Dispense Refill  . albuterol (VENTOLIN HFA) 108 (90 Base) MCG/ACT inhaler Inhale 2 puffs into the lungs every 6 (six) hours as needed for wheezing or shortness of breath. 6.7 g 1  . amLODipine (NORVASC) 5 MG tablet Take 5 mg by mouth daily.     . carvedilol (COREG) 3.125 MG tablet Take 1 tablet (3.125 mg total) by mouth 2 (two) times daily. 180 tablet 3  . chlorpheniramine-HYDROcodone (TUSSIONEX) 10-8 MG/5ML SUER Take 5 mLs by mouth at bedtime as needed for cough. 115 mL 0  . DULoxetine (CYMBALTA) 20 MG capsule Take 1 capsule (20 mg total) by mouth daily. 30 capsule 1  .  gabapentin (NEURONTIN) 300 MG capsule Patient is to take one 300 mg capsule in am, one 300 mg capsule midday and three 300 mg capsules at bedtime. (Patient taking differently: 300 mg 5 (five) times daily. Patient is to take one 300 mg capsule in am, one 300 mg capsule midday and three 300 mg capsules at bedtime.) 150 capsule 3  . letrozole (FEMARA) 2.5 MG tablet Take 1 tablet (2.5 mg total) by mouth daily. 90 tablet 1  . lidocaine-prilocaine (EMLA) cream Apply to affected area once 30 g 3  . LORazepam (ATIVAN) 0.5 MG tablet Take 1 tablet (0.5 mg total) by mouth at bedtime as needed for anxiety. 30 tablet 0  . losartan (COZAAR) 50 MG tablet Take 1 tablet (50 mg total) by mouth daily. 90 tablet 3  . omeprazole (PRILOSEC) 20 MG capsule TAKE 1 CAPSULE BY MOUTH ONCE DAILY 30 capsule 2  . potassium chloride SA (KLOR-CON) 20 MEQ tablet TAKE 1 TABLET BY MOUTH ONCE DAILY 30 tablet 0  . spironolactone (ALDACTONE) 25 MG tablet Take 1 tablet (25 mg total) by mouth daily. 30 tablet 6  . traMADol (ULTRAM) 50 MG tablet Take 1 tablet (50 mg total) by mouth every 6 (six) hours as needed for moderate pain or severe pain. 30 tablet 0  . XARELTO 20 MG TABS tablet TAKE 1 TABLET (20 MG TOTAL) BY MOUTH DAILY WITH SUPPER. 30 tablet 5  . fluticasone (FLONASE) 50 MCG/ACT nasal spray Place 2 sprays into both nostrils 2 (two) times daily as needed for allergies or rhinitis. (Patient not taking: Reported on 06/08/2019) 16 g 0   No facility-administered medications prior to visit.     Review of Systems  Constitutional: Negative for chills, fever, malaise/fatigue and weight loss.  HENT: Positive for congestion and nosebleeds. Negative for hearing loss, sore throat and tinnitus.        Upper airway noise while sleeping.  Eyes: Negative for blurred vision and double vision.  Respiratory: Negative for cough, hemoptysis, sputum production, shortness of breath and wheezing.   Cardiovascular: Negative for chest pain, palpitations,  orthopnea, leg swelling and PND.  Gastrointestinal: Negative for abdominal pain, constipation, diarrhea, heartburn, nausea and vomiting.  Genitourinary: Negative for dysuria, hematuria and urgency.  Musculoskeletal: Negative for joint pain and myalgias.  Skin: Negative for itching and rash.  Neurological: Negative for dizziness, tingling, weakness and headaches.  Endo/Heme/Allergies: Negative for environmental allergies. Does not bruise/bleed easily.  Psychiatric/Behavioral: Negative for depression. The patient is not nervous/anxious and does not have insomnia.  All other systems reviewed and are negative.    Objective:  Physical Exam Vitals signs reviewed.  Constitutional:      General: She is not in acute distress.    Appearance: She is well-developed.  HENT:     Head: Normocephalic and atraumatic.     Comments: Auscultation of the neck with no stridor    Nose: No congestion.     Comments: Right nostril with bloody mucosa along the septum Eyes:     General: No scleral icterus.    Conjunctiva/sclera: Conjunctivae normal.     Pupils: Pupils are equal, round, and reactive to light.  Neck:     Musculoskeletal: Neck supple.     Vascular: No JVD.     Trachea: No tracheal deviation.  Cardiovascular:     Rate and Rhythm: Normal rate and regular rhythm.     Heart sounds: Normal heart sounds. No murmur.  Pulmonary:     Effort: Pulmonary effort is normal. No tachypnea, accessory muscle usage or respiratory distress.     Breath sounds: No stridor. No wheezing, rhonchi or rales.  Abdominal:     General: Bowel sounds are normal. There is no distension.     Palpations: Abdomen is soft.     Tenderness: There is no abdominal tenderness.  Musculoskeletal:        General: No tenderness.  Lymphadenopathy:     Cervical: No cervical adenopathy.  Skin:    General: Skin is warm and dry.     Capillary Refill: Capillary refill takes less than 2 seconds.     Findings: No rash.  Neurological:      Mental Status: She is alert and oriented to person, place, and time.  Psychiatric:        Behavior: Behavior normal.      Vitals:   06/08/19 1432  BP: 134/88  Pulse: 69  Temp: 98.4 F (36.9 C)  TempSrc: Temporal  SpO2: 99%  Weight: 140 lb 9.6 oz (63.8 kg)  Height: '5\' 4"'$  (1.626 m)   99% on RA BMI Readings from Last 3 Encounters:  06/08/19 24.13 kg/m  05/29/19 24.13 kg/m  05/17/19 24.80 kg/m   Wt Readings from Last 3 Encounters:  06/08/19 140 lb 9.6 oz (63.8 kg)  05/29/19 140 lb 9.6 oz (63.8 kg)  05/17/19 144 lb 8 oz (65.5 kg)     CBC    Component Value Date/Time   WBC 6.8 06/08/2019 0820   WBC 3.2 (L) 04/25/2018 2030   RBC 3.89 06/08/2019 0820   HGB 11.2 (L) 06/08/2019 0820   HCT 33.6 (L) 06/08/2019 0820   PLT 181 06/08/2019 0820   MCV 86.4 06/08/2019 0820   MCH 28.8 06/08/2019 0820   MCHC 33.3 06/08/2019 0820   RDW 14.7 06/08/2019 0820   LYMPHSABS 2.5 06/08/2019 0820   MONOABS 0.4 06/08/2019 0820   EOSABS 0.8 (H) 06/08/2019 0820   BASOSABS 0.1 06/08/2019 0820    Chest Imaging: 05/15/2019 CT scan of the chest: CTA of the chest abdomen pelvis revealing stable adenopathy and hepatic lesions consistent with her metastatic breast cancer. Lung parenchyma reviewed.  No obvious interstitial disease. The patient's images have been independently reviewed by me.    Pulmonary Functions Testing Results: No flowsheet data found.  FeNO: none   Pathology: Breast CA   Echocardiogram:   1. Left ventricular ejection fraction, by visual estimation, is 55%. The left ventricle has normal function. There is mild to moderate basilar septal hypertrophy.  2. Global right ventricle  has normal systolic function.The right ventricular size is normal. No increase in right ventricular wall thickness.  3. Left atrial size was normal.  4. Right atrial size was normal.  5. The tricuspid valve is normal in structure. Tricuspid valve regurgitation is trivial.  6. The pulmonic  valve was grossly normal. Pulmonic valve regurgitation is not visualized.  7. Normal pulmonary artery systolic pressure.  8. The mitral valve is normal in structure. Trace mitral valve regurgitation.  9. Left ventricular diastolic parameters are consistent with Grade I diastolic dysfunction (impaired relaxation). 10. The inferior vena cava is normal in size with greater than 50% respiratory variability, suggesting right atrial pressure of 3 mmHg. 11. Small left ventricular internal cavity size. 12. The average left ventricular global longitudinal strain is -17.0 %. 13. The aortic valve is normal in structure. Aortic valve regurgitation is not visualized.  Heart Catheterization: None     Assessment & Plan:     ICD-10-CM   1. Congestion of upper airway  J98.8   2. Wheezing  R06.2   3. Gastroesophageal reflux disease without esophagitis  K21.9   4. Epistaxis  R04.0   5. Lung nodule  R91.1     Discussion:  This is a 63 year old female with metastatic breast cancer, HER-2 positive, PR positive, ER positive currently on chemotherapy with trastuzumab plus Pertuzumab.  She presents today with upper airway symptoms.  And the question of wheezing.  However I am not completely convinced the sounds that she or the daughter here at nighttime are wheezing versus upper airway stridorous sounds related to posterior nasal drip and congestion.  She also has recurrent epistaxis due to nasal dryness.  In addition she has gastroesophageal reflux and not taking her PPI. Lung nodule stable on recent imaging.  Plan: I think starting with a more conservative approach would be the best. Patient was given samples of saline sinus rinse. Additionally was given instructions to purchase an over-the-counter saline nasal spray. She should also start taking her PPI every morning 30 minutes before meals. She is supposed to let us know if her symptoms get worse or better. She can continue to use the albuterol as  needed. If symptoms persist would complete pulmonary function tests. Currently a back log of cases need to be complete due to covid. I think we can hold off right now. Patient agreeable.  There was no obvious parenchymal abnormality on CT imaging.  If the upper airway symptoms persist made to consider imaging of the soft tissue neck as well as referral to ENT.  We discussed this today in the office with the patient.  Greater than 50% of this patient's 45-minute of visit was face-to-face discussing above recommendations and treatment plan.   Current Outpatient Medications:  .  albuterol (VENTOLIN HFA) 108 (90 Base) MCG/ACT inhaler, Inhale 2 puffs into the lungs every 6 (six) hours as needed for wheezing or shortness of breath., Disp: 6.7 g, Rfl: 1 .  amLODipine (NORVASC) 5 MG tablet, Take 5 mg by mouth daily. , Disp: , Rfl:  .  carvedilol (COREG) 3.125 MG tablet, Take 1 tablet (3.125 mg total) by mouth 2 (two) times daily., Disp: 180 tablet, Rfl: 3 .  chlorpheniramine-HYDROcodone (TUSSIONEX) 10-8 MG/5ML SUER, Take 5 mLs by mouth at bedtime as needed for cough., Disp: 115 mL, Rfl: 0 .  DULoxetine (CYMBALTA) 20 MG capsule, Take 1 capsule (20 mg total) by mouth daily., Disp: 30 capsule, Rfl: 1 .  gabapentin (NEURONTIN) 300 MG capsule, Patient is to  take one 300 mg capsule in am, one 300 mg capsule midday and three 300 mg capsules at bedtime. (Patient taking differently: 300 mg 5 (five) times daily. Patient is to take one 300 mg capsule in am, one 300 mg capsule midday and three 300 mg capsules at bedtime.), Disp: 150 capsule, Rfl: 3 .  letrozole (FEMARA) 2.5 MG tablet, Take 1 tablet (2.5 mg total) by mouth daily., Disp: 90 tablet, Rfl: 1 .  lidocaine-prilocaine (EMLA) cream, Apply to affected area once, Disp: 30 g, Rfl: 3 .  LORazepam (ATIVAN) 0.5 MG tablet, Take 1 tablet (0.5 mg total) by mouth at bedtime as needed for anxiety., Disp: 30 tablet, Rfl: 0 .  losartan (COZAAR) 50 MG tablet, Take 1 tablet  (50 mg total) by mouth daily., Disp: 90 tablet, Rfl: 3 .  omeprazole (PRILOSEC) 20 MG capsule, TAKE 1 CAPSULE BY MOUTH ONCE DAILY, Disp: 30 capsule, Rfl: 2 .  potassium chloride SA (KLOR-CON) 20 MEQ tablet, TAKE 1 TABLET BY MOUTH ONCE DAILY, Disp: 30 tablet, Rfl: 0 .  spironolactone (ALDACTONE) 25 MG tablet, Take 1 tablet (25 mg total) by mouth daily., Disp: 30 tablet, Rfl: 6 .  traMADol (ULTRAM) 50 MG tablet, Take 1 tablet (50 mg total) by mouth every 6 (six) hours as needed for moderate pain or severe pain., Disp: 30 tablet, Rfl: 0 .  XARELTO 20 MG TABS tablet, TAKE 1 TABLET (20 MG TOTAL) BY MOUTH DAILY WITH SUPPER., Disp: 30 tablet, Rfl: 5   Garner Nash, DO [facility] 06/08/2019 2:40 PM

## 2019-06-08 NOTE — Patient Instructions (Signed)
[facility] Discharge Instructions for Patients Receiving Chemotherapy  Today you received the following chemotherapy agents Trastuzumab  & Pertuzumab .  To help prevent nausea and vomiting after your treatment, we encourage you to take your nausea medication as prescribed.   If you develop nausea and vomiting that is not controlled by your nausea medication, call the clinic.   BELOW ARE SYMPTOMS THAT SHOULD BE REPORTED IMMEDIATELY:  *FEVER GREATER THAN 100.5 F  *CHILLS WITH OR WITHOUT FEVER  NAUSEA AND VOMITING THAT IS NOT CONTROLLED WITH YOUR NAUSEA MEDICATION  *UNUSUAL SHORTNESS OF BREATH  *UNUSUAL BRUISING OR BLEEDING  TENDERNESS IN MOUTH AND THROAT WITH OR WITHOUT PRESENCE OF ULCERS  *URINARY PROBLEMS  *BOWEL PROBLEMS  UNUSUAL RASH Items with * indicate a potential emergency and should be followed up as soon as possible.  Feel free to call the clinic should you have any questions or concerns. The clinic phone number is [phone].  Please show the [facility] at check-in to the Emergency Department and triage nurse.

## 2019-06-09 LAB — CANCER ANTIGEN 27.29: CA 27.29: 53.8 U/mL — ABNORMAL HIGH (ref 0.0–38.6)

## 2019-06-11 ENCOUNTER — Other Ambulatory Visit: Payer: Self-pay | Admitting: Hematology

## 2019-06-11 MED FILL — XARELTO 20 MG TABLET: 20 | 30 days supply | Qty: 30 | Fill #1

## 2019-06-11 MED FILL — DULOXETINE HCL 20 MG CPEP: 20 | 30 days supply | Qty: 30 | Fill #1

## 2019-06-11 MED FILL — POTASSIUM CHLORIDE CRYS ER: 20 MEQ | 30 days supply | Qty: 30 | Fill #0

## 2019-06-12 ENCOUNTER — Telehealth: Payer: Self-pay | Admitting: *Deleted

## 2019-06-12 NOTE — Telephone Encounter (Signed)
-----   Message from Jonnie Finner, RN sent at 06/12/2019 11:34 AM EST ----- Regarding: Joanna,  I received a phone call from [place] with [employer] regarding her LTD claim reference [id number] faxed to [place] on 11/5.  She wants to know if you received it.  Her call back number is [phone] and her fax is [phone].  Can you please follow up?  Thanks [employer]

## 2019-06-12 NOTE — Telephone Encounter (Signed)
Connected with staff in reference to LTD claim for this patient not received.  Requested resending Attending MD Statement.  Awaiting fax from [employer].  Provided alternate fax number [phone].

## 2019-06-13 MED FILL — GABAPENTIN 300 MG CAPSULE: 300 | 30 days supply | Qty: 150 | Fill #1

## 2019-06-15 MED FILL — OMEPRAZOLE 20 MG CAP: 20 | 30 days supply | Qty: 30 | Fill #2

## 2019-06-19 ENCOUNTER — Telehealth: Payer: Self-pay | Admitting: Pulmonary Disease

## 2019-06-19 DIAGNOSIS — C50919 Malignant neoplasm of unspecified site of unspecified female breast: Secondary | ICD-10-CM

## 2019-06-19 MED ORDER — OMEPRAZOLE 20 MG PO CPDR: 20.0000 mg | DELAYED_RELEASE_CAPSULE | Freq: Two times a day (BID) | ORAL | 2 refills | Status: DC

## 2019-06-19 NOTE — Telephone Encounter (Signed)
I reviewed Dr. Juline Patch last visit note and it does state to increase to twice daily. I have sent this to her pharmacy. I have spoke with the patient and made her aware.

## 2019-06-22 ENCOUNTER — Other Ambulatory Visit: Payer: Self-pay | Admitting: Nurse Practitioner

## 2019-06-22 ENCOUNTER — Encounter: Payer: Self-pay | Admitting: Hematology

## 2019-06-22 ENCOUNTER — Other Ambulatory Visit: Payer: Self-pay | Admitting: Hematology

## 2019-06-22 ENCOUNTER — Telehealth: Payer: Self-pay

## 2019-06-22 MED ORDER — DIPHENOXYLATE-ATROPINE 2.5-0.025 MG PO TABS: 1.0000 | ORAL_TABLET | Freq: Four times a day (QID) | ORAL | 1 refills | Status: DC | PRN

## 2019-06-22 MED FILL — LOSARTAN POTASSIUM 50 MG TA: 50 | 30 days supply | Qty: 30 | Fill #3

## 2019-06-22 MED FILL — DIPHENOXYLATE-ATROPINE 2.5-: 2.5-0.025 | 8 days supply | Qty: 60 | Fill #0

## 2019-06-22 MED FILL — SPIRONOLACTONE 25 MG TABS: 25 | 30 days supply | Qty: 30 | Fill #1

## 2019-06-22 NOTE — Telephone Encounter (Signed)
Needs refill on Lomotil, sent in.

## 2019-06-25 ENCOUNTER — Other Ambulatory Visit: Payer: Self-pay | Admitting: Nurse Practitioner

## 2019-06-25 DIAGNOSIS — F419 Anxiety disorder, unspecified: Secondary | ICD-10-CM

## 2019-06-25 MED ORDER — LORAZEPAM 0.5 MG PO TABS: 0.5000 mg | ORAL_TABLET | Freq: Every evening | ORAL | 0 refills | Status: DC | PRN

## 2019-06-25 MED ORDER — TRAMADOL HCL 50 MG PO TABS: 50.0000 mg | ORAL_TABLET | Freq: Four times a day (QID) | ORAL | 0 refills | Status: DC | PRN

## 2019-06-25 MED FILL — LORazepam 0.5 MG TABS: 0.5 | 30 days supply | Qty: 30 | Fill #0

## 2019-06-25 MED FILL — traMADol HCL 50 MG TABS: 50 | 30 days supply | Qty: 30 | Fill #0

## 2019-06-25 NOTE — Telephone Encounter (Signed)
Refill request per pt

## 2019-06-25 NOTE — Telephone Encounter (Signed)
Refill request

## 2019-06-27 MED FILL — OMEPRAZOLE 20 MG CAP: 20 | 30 days supply | Qty: 60 | Fill #0

## 2019-06-27 NOTE — Progress Notes (Signed)
[place]   [phone] Fax:([phone]) [phone]   Clinic Follow up Note   Patient Care Team: Levin Bacon, NP as PCP - General (Family Medicine) [name], MD as Consulting Physician (Gastroenterology) [name], MD as Consulting Physician (Hematology)  Date of Service:  06/29/2019  CHIEF COMPLAINT: F/u for metastatic breast cancer  SUMMARY OF ONCOLOGIC HISTORY: Oncology History Overview Note  Cancer Staging Metastatic breast cancer [facility]) Staging form: Breast, AJCC 8th Edition - Clinical stage from 03/24/2018: Stage IV (cT2, cN1, pM1, G3, ER+, PR+, HER2+) - Signed by [name], MD on 03/30/2018     Metastatic breast cancer ([place])  01/30/2018 Procedure   Colonoscopy showed small polyp in the sigmoid colon, removed, the exam of colon including the terminal ileum was otherwise negative.   01/30/2018 Procedure   EGD by Dr. Collene Mares showed small hiatal hernia, a 8 mm polypoid lesion in the cardia, biopsied.   03/09/2018 Imaging   03/09/2018 US Abdomen IMPRESSION: 1. Mass lesions throughout the liver, consistent with metastatic disease. Liver as a somewhat nodular contour suggesting underlying hepatic cirrhosis. Inhomogeneous echotexture to the liver.  2. Cholelithiasis with mild gallbladder wall thickening. A degree of cholecystitis cannot be excluded by ultrasound.  3. Portions of pancreas obscured by gas. Visualized portions of pancreas appear normal.  4. Small right kidney. Etiology uncertain. This finding potentially may be indicative of renal artery stenosis. In this regard, question whether patient is hypertensive.   03/15/2018 Imaging   CT CAP with contrast  IMPRESSION: 1. Widespread hepatic metastasis. 2. 2.6 cm lateral right breast soft tissue nodule could represent a breast primary or an incidental benign lesion. Consider correlation with mammogram and ultrasound. 3. No definite source of primary malignancy identified within the abdomen or  pelvis. There is possible rectosigmoid junction wall thickening. Consider colonoscopy with attention to this area. 4. Distal esophageal wall thickening, suggesting esophagitis.   03/24/2018 Cancer Staging   Staging form: Breast, AJCC 8th Edition - Clinical stage from 03/24/2018: Stage IV (cT2, cN1, pM1, G3, ER+, PR+, HER2+) - Signed by [name], MD on 03/30/2018   03/24/2018 Initial Biopsy   Diagnosis 1. 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 Breast, right, needle core biopsy, 9 o'clock, 7cm from nipple - INVASIVE DUCTAL CARCINOMA. -The carcinoma is somewhat morphologically dissimilar from that in part 1. It appears grade III 3. Lymph node, needle/core biopsy, right axillary - METASTATIC CARCINOMA IN 1 OF 1 LYMPH NODE (1/1).   03/24/2018 Receptors her2   Breast biopsy: 1. Estrogen Receptor: 40%, POSITIVE, STRONG-MODERATE STAINING INTENSITY Progesterone Receptor: 70%, POSITIVE, STRONG STAINING INTENSITY Proliferation Marker Ki67: 20% HER 2 equivocal by IHC 2+, POSITIVE by FISH, ratio 2.4 and copy #4.2  2. Estrogen Receptor: 60%, POSITIVE, MODERATE STAINING INTENSITY Progesterone Receptor: 40%, POSITIVE, MODERATE STAINING INTENSITY Proliferation Marker Ki67: 20% HER2 (+) by IHC 3+   03/24/2018 Initial Diagnosis   Metastatic breast cancer ([place])   03/27/2018 Pathology Results   Diagnosis Liver, needle/core biopsy, Right - METASTATIC CARCINOMA TO LIVER, CONSISTENT WITH PATIENTS CLINICAL HISTORY OF PRIMARY BREAST CARCINOMA.  ER 80%+ PR40%+ HER2- (by [facility], IHC 2+)  Ki67 50%    03/28/2018 Pathology Results   03/28/2018 Surgical Pathology Diagnosis 1. Breast, left, needle core biopsy, 9 o'clock - FIBROCYSTIC CHANGES. - THERE IS NO EVIDENCE OF MALIGNANCY. 2. Breast, left, needle core biopsy, 2 o'clock - FIBROADENOMA. - THERE IS NO EVIDENCE OF  MALIGNANCY. - SEE COMMENT.   03/29/2018 Imaging    03/29/2018 Bone Scan IMPRESSION: No scintigraphic evidence of osseous metastatic disease.   03/30/2018 Imaging   Bone scan  IMPRESSION: No scintigraphic evidence of osseous metastatic disease.    04/07/2018 -  Chemotherapy   First line chemo weekly Taxol and herceptin/Perjeta every 3 weeks starting 04/07/18. She developed infusion reaction to taxol and it was discontinued. Added Abraxane on C1D8, 2 weeks on/1 week off.  Abraxne stopped after 10/13/18 due to worsening Neuropathy. She has continued with Herceptin injection/Perjeta q3weeks.    06/06/2018 Imaging   CT CAP IMPRESSION: 1. Generally improved appearance, with reduced axillary adenopathy and reduced enhancing component of the hepatic masses, with some of the hepatic mass is moderately smaller than on the prior exam. Reduced size of the right lateral breast mass compared to the prior 03/15/2018 exam. 2. New mild interstitial accentuation in the lungs, significance uncertain. Part of this appearance may be due to lower lung volumes on today's exam. 3. Mild wall thickening in the descending colon and upper rectum suggesting low-grade colitis/inflammation. Prominent stool throughout the colon favors constipation. 4. Other imaging findings of potential clinical significance: Aortic Atherosclerosis (ICD10-I70.0). Mild cardiomegaly. Mild nodularity in the right lower lobe appears stable. Contracted and thick-walled gallbladder.    09/18/2018 Imaging   CT CAP W Contrast 09/18/18  IMPRESSION: 1. Liver metastases have decreased in size. 2. Right breast mass, mild right axillary lymphadenopathy and scattered tiny right pulmonary nodules are all stable. 3. New mild left supraclavicular and left subpectoral lymphadenopathy, can not exclude progression of metastatic nodal disease. 4. Moderate colorectal stool volume, which may indicate constipation. 5.  Aortic Atherosclerosis (ICD10-I70.0).   10/2018 -  Anti-estrogen oral therapy   Letrozole 2.5  mg daily starting 10/2018   01/10/2019 Imaging   CT CAP W Contrast 01/10/19  IMPRESSION: 1. Continued improvement in the hepatic metastatic lesions which have reduced in size. 2. Stable mild left supraclavicular and subpectoral adenopathy. 3. Essentially stable small right lower lobe pulmonary nodule and separate small subpleural nodule along the right hemidiaphragm. Surveillance suggested. 4. Other imaging findings of potential clinical significance: Mild cardiomegaly. Mild circumferential distal esophageal wall thickening, the most common cause would be esophagitis. Airway thickening is present, suggesting bronchitis or reactive airways disease. Airway plugging in the lower lobes and in the right middle lobe. Stable small right adrenal adenoma.   05/15/2019 Imaging   CT CAP W Contrast 05/15/19  IMPRESSION: CT CHEST IMPRESSION   1. Similar appearance of borderline supraclavicular, axillary, and subpectoral adenopathy. 2. Improved and resolved right lower lobe pulmonary nodularity. 3. Esophageal air fluid level suggests dysmotility or gastroesophageal reflux.   CT ABDOMEN AND PELVIS IMPRESSION   1. Improved hepatic metastasis. 2. Small bowel mesenteric lymph nodes which are upper normal and mildly enlarged. Likely increased and similar as detailed above. Indeterminate. Recommend attention on follow-up. 3. Cholelithiasis. 4. Motion degradation throughout the lower chest and abdomen.      CURRENT THERAPY:  -First line weekly TaxolwithHerceptin/Perjeta every 3 weeks starting 04/07/18. She developed infusion reaction to taxol and it was discontinued. Changed to Abraxaneweekly2 weeks on/1 week offfrom 07/21/2018.Abraxane held after October 13, 2018 due to his worsening neuropathy.She will continue Herceptin injection/Perjeta q3weeks maintenance therapy.  -Letrozole 2.5 mg daily starting 10/2018  INTERVAL HISTORY:  Teresa Hays is here for a follow up and treatment. She  presents to the clinic with family member. She notes she saw Pulmonologist Dr. Valeta Harms who notes her wheezing  is from post nasal drip. She notes her pain of soles of right foot is stable. She denies any more LE edema. She notes tingling of her fingertips. She is taking Gabapentin 380m in the AM and 3048min the evening and 90073mt night. She also notes skin breakout of her wrists. She thinks it is from dry skin. She denies any abdominal pain. Her appetite is adequate and weight maintained. She notes energy is adequate and takes naps as needed. She does not plan to return to work.   REVIEW OF SYSTEMS:   Constitutional: Denies fevers, chills or abnormal weight loss Eyes: Denies blurriness of vision Ears, nose, mouth, throat, and face: Denies mucositis or sore throat Respiratory: Denies cough, dyspnea (+) improved wheezes Cardiovascular: Denies palpitation, chest discomfort or lower extremity swelling Gastrointestinal:  Denies nausea, heartburn or change in bowel habits Skin: Denies abnormal skin rashes Lymphatics: Denies new lymphadenopathy or easy bruising Neurological: (+) Tingling of fingers (+) pain of sole of right foot Behavioral/Psych: Mood is stable, no new changes  All other systems were reviewed with the patient and are negative.  MEDICAL HISTORY:  Past Medical History:  Diagnosis Date   GERD (gastroesophageal reflux disease)    Hypertension    rt breast ca with mets to liver dx'd 03/2018    SURGICAL HISTORY: Past Surgical History:  Procedure Laterality Date   COLONOSCOPY     ESOPHAGOGASTRODUODENOSCOPY ENDOSCOPY     IR IMAGING GUIDED PORT INSERTION  04/04/2018    I have reviewed the social history and family history with the patient and they are unchanged from previous note.  ALLERGIES:  has No Known Allergies.  MEDICATIONS:  Current Outpatient Medications  Medication Sig Dispense Refill   albuterol (VENTOLIN HFA) 108 (90 Base) MCG/ACT inhaler Inhale 2 puffs into  the lungs every 6 (six) hours as needed for wheezing or shortness of breath. 6.7 g 1   amLODipine (NORVASC) 5 MG tablet Take 5 mg by mouth daily.      chlorpheniramine-HYDROcodone (TUSSIONEX) 10-8 MG/5ML SUER Take 5 mLs by mouth at bedtime as needed for cough. 115 mL 0   diphenoxylate-atropine (LOMOTIL) 2.5-0.025 MG tablet Take 1-2 tablets by mouth 4 (four) times daily as needed for diarrhea or loose stools. 60 tablet 1   DULoxetine (CYMBALTA) 20 MG capsule Take 1 capsule (20 mg total) by mouth daily. 30 capsule 1   gabapentin (NEURONTIN) 300 MG capsule Patient is to take one 300 mg capsule in am, one 300 mg capsule midday and three 300 mg capsules at bedtime. (Patient taking differently: 300 mg 5 (five) times daily. Patient is to take one 300 mg capsule in am, one 300 mg capsule midday and three 300 mg capsules at bedtime.) 150 capsule 3   letrozole (FEMARA) 2.5 MG tablet Take 1 tablet (2.5 mg total) by mouth daily. 90 tablet 1   lidocaine-prilocaine (EMLA) cream Apply to affected area once 30 g 3   LORazepam (ATIVAN) 0.5 MG tablet Take 1 tablet (0.5 mg total) by mouth at bedtime as needed for anxiety. 30 tablet 0   losartan (COZAAR) 50 MG tablet Take 1 tablet (50 mg total) by mouth daily. 90 tablet 3   omeprazole (PRILOSEC) 20 MG capsule Take 1 capsule (20 mg total) by mouth 2 (two) times daily before a meal. 60 capsule 2   potassium chloride SA (KLOR-CON) 20 MEQ tablet TAKE 1 TABLET BY MOUTH ONCE DAILY 30 tablet 0   spironolactone (ALDACTONE) 25 MG tablet Take 1 tablet (  25 mg total) by mouth daily. 30 tablet 6   traMADol (ULTRAM) 50 MG tablet Take 1 tablet (50 mg total) by mouth every 6 (six) hours as needed for moderate pain or severe pain. 30 tablet 0   XARELTO 20 MG TABS tablet TAKE 1 TABLET (20 MG TOTAL) BY MOUTH DAILY WITH SUPPER. 30 tablet 5   carvedilol (COREG) 3.125 MG tablet Take 1 tablet (3.125 mg total) by mouth 2 (two) times daily. 180 tablet 3   No current  facility-administered medications for this visit.    PHYSICAL EXAMINATION: ECOG PERFORMANCE STATUS: 1 - Symptomatic but completely ambulatory  Vitals:   06/29/19 0915  BP: (!) 156/94  Pulse: 69  Resp: 18  Temp: 99.8 F (37.7 C)  SpO2: 100%   Filed Weights   06/29/19 0915  Weight: 141 lb 4.8 oz (64.1 kg)    GENERAL:alert, no distress and comfortable SKIN: skin color, texture, turgor are normal, no rashes or significant lesions EYES: normal, Conjunctiva are pink and non-injected, sclera clear  NECK: supple, thyroid normal size, non-tender, without nodularity LYMPH:  no palpable lymphadenopathy in the cervical, axillary  LUNGS: clear to auscultation and percussion (+) improved wheezing  HEART: regular rate & rhythm and no murmurs and no lower extremity edema ABDOMEN:abdomen soft, non-tender and normal bowel sounds Musculoskeletal:no cyanosis of digits and no clubbing  NEURO: alert & oriented x 3 with fluent speech, no focal motor/sensory deficits  LABORATORY DATA:  I have reviewed the data as listed CBC Latest Ref Rng & Units 06/29/2019 06/08/2019 05/17/2019  WBC 4.0 - 10.5 K/uL 6.6 6.8 6.5  Hemoglobin 12.0 - 15.0 g/dL 10.8(L) 11.2(L) 10.9(L)  Hematocrit 36.0 - 46.0 % 33.6(L) 33.6(L) 33.4(L)  Platelets 150 - 400 K/uL 196 181 198     CMP Latest Ref Rng & Units 06/29/2019 06/08/2019 05/29/2019  Glucose 70 - 99 mg/dL 100(H) 100(H) 95  BUN 8 - 23 mg/dL 24(H) 16 13  Creatinine 0.44 - 1.00 mg/dL 1.35(H) 1.17(H) 1.04(H)  Sodium 135 - 145 mmol/L 137 137 140  Potassium 3.5 - 5.1 mmol/L 4.2 4.3 4.1  Chloride 98 - 111 mmol/L 105 104 104  CO2 22 - 32 mmol/L _0 Calcium 8.9 - 10.3 mg/dL 9.5 9.4 10.2  Total Protein 6.5 - 8.1 g/dL 7.6 7.6 -  Total Bilirubin 0.3 - 1.2 mg/dL 0.3 0.3 -  Alkaline Phos 38 - 126 U/L 133(H) 135(H) -  AST 15 - 41 U/L 23 22 -  ALT 0 - 44 U/L 16 15 -      RADIOGRAPHIC STUDIES: I have personally reviewed the radiological images as listed and  agreed with the findings in the report. No results found.   ASSESSMENT & PLAN:  Teresa Hays is a 63 y.o. female with   1. Metastatic right breast cancer to liver, cT2N1M1, stage IV, ER+/PR+/HER2+, Liver mets ER+/PR+/HER2- -She was diagnosed in 03/2018.She presented with diffuse liver metastasis, tworight breast massesand right axillary adenopathy.Breast mass biopsy showed ER PR positive, but 1 was HER-2 positive, the other one was HER-2 negative. Liver met was HER2-.  -Given the metastatic disease, her cancer is not curable but still treatable. Shehas been treated withLetrozole daily and First-line Herceptin/Perjetaevery 3 weeks and weekly Taxol. Due to infusionreaction, taxol was switched to Abraxane. We stoppedAbraxaneafter cycle 10 due to worsening neuropathy and continued with Herceptin/Perjeta as maintenance therapy.  -I discussed we will continue maintenance therapy for as long as this controls her disease. Will monitor with scans.  -  She is clinically doing well. Breathing is doing better, her neuropathy in fingers and right foot is stable and manageable. She is able to maintain her weight and energy. Labs reviewed and adequate to proceed with Herceptin/Perjeta today. Continue every 3 weeks  -Continue Letrozole -F/u in 6 weeks, plan to repeat restaging scan in March (4-5 months from last scan) if she is clinically doing well   2.Peripheral neuropathy, grade 2 -Secondarytochemotherapy,mainly in her feet -Ipreviouslystropped Abraxane on 10/13/18. -She is currently on Oral B12, Gabapentin and Tramadol every other night and low dose Cymbalta.  -She has tried PT twice and does not feel it helps as much as she does not feel like a fall risk.   -Tingling in fingers are stable and manageable.   4.Left UEDVT, Right LE edema -she developed LUE DVT on 08/04/2018, edema has resolved after anticoagulation -Continue onXarelto 84m indefinitely, unless she has significant  bleeding or other side effects. -Shepreviouslydeveloped LE edema, right significantly more than left.10/13/18 doppler was negative for DVT.  -Ipreviouslyput her on 1 week dose of Lasix and potassium -Right LE edema resolved but has residual pain.   5. Anemia -She developed worseningwhen she was on Chemo, now she is off.  -Continue monitoring, will consider blood transfusion if hemoglobin less than 8.  6Social support  -She lives alone, [place] away  -On Ativan PRN due to anxietyabout diagnosis and treatment  7. Goal of care discussion  -she is full code -She understands the goal of care is palliative, to prolong her life, and improve her quality of life.  8. Recurrent dyspnea and wheezing  -She has been seen by Dr. IValeta Harmswho notes this is related to her post nasal drip and her Acid reflux can contribute.  -Her Prilosec was increased to twice daily. She will continue to f/u with D.r Icard.  -Wheezing much improved on exam today (06/29/19)   Plan: -Labs reviewed and adequate to proceed with Herceptin/Perjeta today  -continue letrozole -lab, flush, and Herceptin/Perjeta in 3, 6 weeks  -F/u in 6 weeks will order restaging scan on next visit    No problem-specific Assessment & Plan notes found for this encounter.   No orders of the defined types were placed in this encounter.  All questions were answered. The patient knows to call the clinic with any problems, questions or concerns. No barriers to learning was detected. I spent 20 minutes counseling the patient face to face. The total time spent in the appointment was 25 minutes and more than 50% was on counseling and review of test results     YTruitt Merle MD 06/29/2019   I, AJoslyn Devon am acting as scribe for YTruitt Merle MD.   I have reviewed the above documentation for accuracy and completeness, and I agree with the above.

## 2019-06-29 ENCOUNTER — Inpatient Hospital Stay (HOSPITAL_BASED_OUTPATIENT_CLINIC_OR_DEPARTMENT_OTHER): Payer: 59 | Admitting: Hematology

## 2019-06-29 ENCOUNTER — Inpatient Hospital Stay: Payer: 59 | Attending: Hematology

## 2019-06-29 ENCOUNTER — Inpatient Hospital Stay: Payer: 59

## 2019-06-29 ENCOUNTER — Encounter: Payer: Self-pay | Admitting: Hematology

## 2019-06-29 ENCOUNTER — Other Ambulatory Visit: Payer: Self-pay

## 2019-06-29 VITALS — BP 156/94 | HR 69 | Temp 99.8°F | Resp 18 | Ht 64.0 in | Wt 141.3 lb

## 2019-06-29 VITALS — BP 144/92 | HR 69 | Temp 98.0°F | Resp 18

## 2019-06-29 DIAGNOSIS — Z79899 Other long term (current) drug therapy: Secondary | ICD-10-CM | POA: Insufficient documentation

## 2019-06-29 DIAGNOSIS — R160 Hepatomegaly, not elsewhere classified: Secondary | ICD-10-CM | POA: Insufficient documentation

## 2019-06-29 DIAGNOSIS — C50919 Malignant neoplasm of unspecified site of unspecified female breast: Secondary | ICD-10-CM

## 2019-06-29 DIAGNOSIS — R062 Wheezing: Secondary | ICD-10-CM | POA: Diagnosis not present

## 2019-06-29 DIAGNOSIS — T451X5A Adverse effect of antineoplastic and immunosuppressive drugs, initial encounter: Secondary | ICD-10-CM | POA: Diagnosis not present

## 2019-06-29 DIAGNOSIS — Z170 Estrogen receptor positive status [ER+]: Secondary | ICD-10-CM | POA: Insufficient documentation

## 2019-06-29 DIAGNOSIS — M79671 Pain in right foot: Secondary | ICD-10-CM | POA: Diagnosis not present

## 2019-06-29 DIAGNOSIS — K449 Diaphragmatic hernia without obstruction or gangrene: Secondary | ICD-10-CM | POA: Insufficient documentation

## 2019-06-29 DIAGNOSIS — Z7901 Long term (current) use of anticoagulants: Secondary | ICD-10-CM | POA: Diagnosis not present

## 2019-06-29 DIAGNOSIS — C50811 Malignant neoplasm of overlapping sites of right female breast: Secondary | ICD-10-CM | POA: Insufficient documentation

## 2019-06-29 DIAGNOSIS — C787 Secondary malignant neoplasm of liver and intrahepatic bile duct: Secondary | ICD-10-CM | POA: Insufficient documentation

## 2019-06-29 DIAGNOSIS — D649 Anemia, unspecified: Secondary | ICD-10-CM | POA: Diagnosis not present

## 2019-06-29 DIAGNOSIS — Z5112 Encounter for antineoplastic immunotherapy: Secondary | ICD-10-CM | POA: Insufficient documentation

## 2019-06-29 DIAGNOSIS — R0982 Postnasal drip: Secondary | ICD-10-CM | POA: Insufficient documentation

## 2019-06-29 DIAGNOSIS — R918 Other nonspecific abnormal finding of lung field: Secondary | ICD-10-CM | POA: Insufficient documentation

## 2019-06-29 DIAGNOSIS — D6481 Anemia due to antineoplastic chemotherapy: Secondary | ICD-10-CM

## 2019-06-29 DIAGNOSIS — I700 Atherosclerosis of aorta: Secondary | ICD-10-CM | POA: Insufficient documentation

## 2019-06-29 DIAGNOSIS — R590 Localized enlarged lymph nodes: Secondary | ICD-10-CM | POA: Insufficient documentation

## 2019-06-29 DIAGNOSIS — N270 Small kidney, unilateral: Secondary | ICD-10-CM | POA: Diagnosis not present

## 2019-06-29 DIAGNOSIS — R202 Paresthesia of skin: Secondary | ICD-10-CM | POA: Insufficient documentation

## 2019-06-29 DIAGNOSIS — I517 Cardiomegaly: Secondary | ICD-10-CM | POA: Diagnosis not present

## 2019-06-29 DIAGNOSIS — C773 Secondary and unspecified malignant neoplasm of axilla and upper limb lymph nodes: Secondary | ICD-10-CM | POA: Diagnosis not present

## 2019-06-29 DIAGNOSIS — Z86718 Personal history of other venous thrombosis and embolism: Secondary | ICD-10-CM | POA: Insufficient documentation

## 2019-06-29 DIAGNOSIS — D3501 Benign neoplasm of right adrenal gland: Secondary | ICD-10-CM | POA: Insufficient documentation

## 2019-06-29 DIAGNOSIS — G620 Drug-induced polyneuropathy: Secondary | ICD-10-CM | POA: Insufficient documentation

## 2019-06-29 DIAGNOSIS — Z7189 Other specified counseling: Secondary | ICD-10-CM

## 2019-06-29 DIAGNOSIS — K8010 Calculus of gallbladder with chronic cholecystitis without obstruction: Secondary | ICD-10-CM | POA: Diagnosis not present

## 2019-06-29 DIAGNOSIS — Z79811 Long term (current) use of aromatase inhibitors: Secondary | ICD-10-CM | POA: Diagnosis not present

## 2019-06-29 DIAGNOSIS — Z95828 Presence of other vascular implants and grafts: Secondary | ICD-10-CM

## 2019-06-29 LAB — CBC WITH DIFFERENTIAL (CANCER CENTER ONLY)
Abs Immature Granulocytes: 0.01 10*3/uL (ref 0.00–0.07)
Basophils Absolute: 0 10*3/uL (ref 0.0–0.1)
Basophils Relative: 1 %
Eosinophils Absolute: 0.6 10*3/uL — ABNORMAL HIGH (ref 0.0–0.5)
Eosinophils Relative: 10 %
HCT: 33.6 % — ABNORMAL LOW (ref 36.0–46.0)
Hemoglobin: 10.8 g/dL — ABNORMAL LOW (ref 12.0–15.0)
Immature Granulocytes: 0 %
Lymphocytes Relative: 34 %
Lymphs Abs: 2.2 10*3/uL (ref 0.7–4.0)
MCH: 27.7 pg (ref 26.0–34.0)
MCHC: 32.1 g/dL (ref 30.0–36.0)
MCV: 86.2 fL (ref 80.0–100.0)
Monocytes Absolute: 0.3 10*3/uL (ref 0.1–1.0)
Monocytes Relative: 5 %
Neutro Abs: 3.3 10*3/uL (ref 1.7–7.7)
Neutrophils Relative %: 50 %
Platelet Count: 196 10*3/uL (ref 150–400)
RBC: 3.9 MIL/uL (ref 3.87–5.11)
RDW: 14.7 % (ref 11.5–15.5)
WBC Count: 6.6 10*3/uL (ref 4.0–10.5)
nRBC: 0 % (ref 0.0–0.2)

## 2019-06-29 LAB — CMP (CANCER CENTER ONLY)
ALT: 16 U/L (ref 0–44)
AST: 23 U/L (ref 15–41)
Albumin: 4.1 g/dL (ref 3.5–5.0)
Alkaline Phosphatase: 133 U/L — ABNORMAL HIGH (ref 38–126)
Anion gap: 8 (ref 5–15)
BUN: 24 mg/dL — ABNORMAL HIGH (ref 8–23)
CO2: 24 mmol/L (ref 22–32)
Calcium: 9.5 mg/dL (ref 8.9–10.3)
Chloride: 105 mmol/L (ref 98–111)
Creatinine: 1.35 mg/dL — ABNORMAL HIGH (ref 0.44–1.00)
GFR, Est AFR Am: 49 mL/min — ABNORMAL LOW (ref >60)
GFR, Estimated: 42 mL/min — ABNORMAL LOW (ref >60)
Glucose, Bld: 100 mg/dL — ABNORMAL HIGH (ref 70–99)
Potassium: 4.2 mmol/L (ref 3.5–5.1)
Sodium: 137 mmol/L (ref 135–145)
Total Bilirubin: 0.3 mg/dL (ref 0.3–1.2)
Total Protein: 7.6 g/dL (ref 6.5–8.1)

## 2019-06-29 MED ORDER — DIPHENHYDRAMINE HCL 25 MG PO CAPS
ORAL_CAPSULE | ORAL | Status: AC
  Filled 2019-06-29: qty 1

## 2019-06-29 MED ORDER — DIPHENHYDRAMINE HCL 50 MG/ML IJ SOLN
25.0000 mg | Freq: Once | INTRAMUSCULAR | Status: AC
  Administered 2019-06-29: 25 mg via INTRAVENOUS

## 2019-06-29 MED ORDER — ACETAMINOPHEN 325 MG PO TABS
650.0000 mg | ORAL_TABLET | Freq: Once | ORAL | Status: AC
  Administered 2019-06-29: 650 mg via ORAL

## 2019-06-29 MED ORDER — SODIUM CHLORIDE 0.9% FLUSH
10.0000 mL | INTRAVENOUS | Status: DC | PRN
  Administered 2019-06-29: 10 mL
  Filled 2019-06-29: qty 10

## 2019-06-29 MED ORDER — DIPHENHYDRAMINE HCL 50 MG/ML IJ SOLN
INTRAMUSCULAR | Status: AC
  Filled 2019-06-29: qty 1

## 2019-06-29 MED ORDER — ACETAMINOPHEN 325 MG PO TABS
ORAL_TABLET | ORAL | Status: AC
  Filled 2019-06-29: qty 2

## 2019-06-29 MED ORDER — SODIUM CHLORIDE 0.9 % IV SOLN
Freq: Once | INTRAVENOUS | Status: AC
  Administered 2019-06-29: 11:00:00 via INTRAVENOUS
  Filled 2019-06-29: qty 250

## 2019-06-29 MED ORDER — TRASTUZUMAB-DKST CHEMO 150 MG IV SOLR
6.0000 mg/kg | Freq: Once | INTRAVENOUS | Status: AC
  Administered 2019-06-29: 399 mg via INTRAVENOUS
  Filled 2019-06-29: qty 19

## 2019-06-29 MED ORDER — HEPARIN SOD (PORK) LOCK FLUSH 100 UNIT/ML IV SOLN
500.0000 [IU] | Freq: Once | INTRAVENOUS | Status: AC | PRN
  Administered 2019-06-29: 500 [IU]
  Filled 2019-06-29: qty 5

## 2019-06-29 MED ORDER — SODIUM CHLORIDE 0.9 % IV SOLN
420.0000 mg | Freq: Once | INTRAVENOUS | Status: AC
  Administered 2019-06-29: 420 mg via INTRAVENOUS
  Filled 2019-06-29: qty 14

## 2019-06-29 NOTE — Patient Instructions (Signed)
[facility] Discharge Instructions for Patients Receiving Chemotherapy  Today you received the following chemotherapy agents Trastuzumab (OGIVRI) & Pertuzumab (PERJETA).  To help prevent nausea and vomiting after your treatment, we encourage you to take your nausea medication as prescribed.   If you develop nausea and vomiting that is not controlled by your nausea medication, call the clinic.   BELOW ARE SYMPTOMS THAT SHOULD BE REPORTED IMMEDIATELY:  *FEVER GREATER THAN 100.5 F  *CHILLS WITH OR WITHOUT FEVER  NAUSEA AND VOMITING THAT IS NOT CONTROLLED WITH YOUR NAUSEA MEDICATION  *UNUSUAL SHORTNESS OF BREATH  *UNUSUAL BRUISING OR BLEEDING  TENDERNESS IN MOUTH AND THROAT WITH OR WITHOUT PRESENCE OF ULCERS  *URINARY PROBLEMS  *BOWEL PROBLEMS  UNUSUAL RASH Items with * indicate a potential emergency and should be followed up as soon as possible.  Feel free to call the clinic should you have any questions or concerns. The clinic phone number is [phone].  Please show the [facility] at check-in to the Emergency Department and triage nurse.  Coronavirus (COVID-19) Are you at risk?  Are you at risk for the Coronavirus (COVID-19)?  To be considered HIGH RISK for Coronavirus (COVID-19), you have to meet the following criteria:  . Traveled to [place], [place], [place], [place] or [place]; or in the [place] to [place], [place], [place], or [place]; and have fever, cough, and shortness of breath within the last 2 weeks of travel OR . Been in close contact with a person diagnosed with COVID-19 within the last 2 weeks and have fever, cough, and shortness of breath . IF YOU DO NOT MEET THESE CRITERIA, YOU ARE CONSIDERED LOW RISK FOR COVID-19.  What to do if you are HIGH RISK for COVID-19?  Marland Kitchen If you are having a medical emergency, call 911. . Seek medical care right away. Before you go to a doctor's office, urgent care or emergency department, call  ahead and tell them about your recent travel, contact with someone diagnosed with COVID-19, and your symptoms. You should receive instructions from your physician's office regarding next steps of care.  . When you arrive at healthcare provider, tell the healthcare staff immediately you have returned from visiting [place], [place], [place], [place] or [place]; or traveled in the [place] to [place], [place], [place], or [place]; in the last two weeks or you have been in close contact with a person diagnosed with COVID-19 in the last 2 weeks.   . Tell the health care staff about your symptoms: fever, cough and shortness of breath. . After you have been seen by a medical provider, you will be either: o Tested for (COVID-19) and discharged home on quarantine except to seek medical care if symptoms worsen, and asked to  - Stay home and avoid contact with others until you get your results (4-5 days)  - Avoid travel on public transportation if possible (such as bus, train, or airplane) or o Sent to the Emergency Department by EMS for evaluation, COVID-19 testing, and possible admission depending on your condition and test results.  What to do if you are LOW RISK for COVID-19?  Reduce your risk of any infection by using the same precautions used for avoiding the common cold or flu:  Marland Kitchen Wash your hands often with soap and warm water for at least 20 seconds.  If soap and water are not readily available, use an alcohol-based hand sanitizer with at least 60% alcohol.  Marland Kitchen  If coughing or sneezing, cover your mouth and nose by coughing or sneezing into the elbow areas of your shirt or coat, into a tissue or into your sleeve (not your hands). . Avoid shaking hands with others and consider head nods or verbal greetings only. . Avoid touching your eyes, nose, or mouth with unwashed hands.  . Avoid close contact with people who are sick. . Avoid places or events with large numbers of people in one location,  like concerts or sporting events. . Carefully consider travel plans you have or are making. . If you are planning any travel outside or inside the [place], visit the CDC's Travelers' Health webpage for the latest health notices. . If you have some symptoms but not all symptoms, continue to monitor at home and seek medical attention if your symptoms worsen. . If you are having a medical emergency, call 911.   ADDITIONAL HEALTHCARE OPTIONS FOR PATIENTS  [facility] Tele[facility] / e-Visit: [email or web address]         [facility]: [phone]  [facility]: [phone]                   [facility]: [phone]    

## 2019-06-30 LAB — CANCER ANTIGEN 27.29: CA 27.29: 46.3 U/mL — ABNORMAL HIGH (ref 0.0–38.6)

## 2019-07-02 ENCOUNTER — Telehealth: Payer: Self-pay | Admitting: Student in an Organized Health Care Education/Training Program

## 2019-07-02 NOTE — Telephone Encounter (Signed)
Scheduled appt per 12/ los.  Spoke with pt and she is aware

## 2019-07-09 ENCOUNTER — Other Ambulatory Visit: Payer: Self-pay | Admitting: Hematology

## 2019-07-09 MED FILL — DULOXETINE HCL 20 MG CPEP: 20 | 30 days supply | Qty: 30 | Fill #0

## 2019-07-09 MED FILL — XARELTO 20 MG TABLET: 20 | 30 days supply | Qty: 30 | Fill #2

## 2019-07-09 MED FILL — POTASSIUM CHLORIDE CRYS ER: 20 | 30 days supply | Qty: 30 | Fill #0

## 2019-07-12 ENCOUNTER — Other Ambulatory Visit: Payer: Self-pay | Admitting: Hematology

## 2019-07-14 MED FILL — GABAPENTIN 300 MG CAPSULE: 300 | 30 days supply | Qty: 150 | Fill #2

## 2019-07-19 ENCOUNTER — Inpatient Hospital Stay: Payer: 59

## 2019-07-19 ENCOUNTER — Other Ambulatory Visit: Payer: Self-pay

## 2019-07-19 VITALS — BP 135/85 | HR 65 | Temp 98.3°F | Resp 17

## 2019-07-19 DIAGNOSIS — R590 Localized enlarged lymph nodes: Secondary | ICD-10-CM | POA: Diagnosis not present

## 2019-07-19 DIAGNOSIS — C773 Secondary and unspecified malignant neoplasm of axilla and upper limb lymph nodes: Secondary | ICD-10-CM | POA: Diagnosis not present

## 2019-07-19 DIAGNOSIS — C50811 Malignant neoplasm of overlapping sites of right female breast: Secondary | ICD-10-CM | POA: Diagnosis not present

## 2019-07-19 DIAGNOSIS — I700 Atherosclerosis of aorta: Secondary | ICD-10-CM | POA: Diagnosis not present

## 2019-07-19 DIAGNOSIS — Z170 Estrogen receptor positive status [ER+]: Secondary | ICD-10-CM | POA: Diagnosis not present

## 2019-07-19 DIAGNOSIS — C50919 Malignant neoplasm of unspecified site of unspecified female breast: Secondary | ICD-10-CM

## 2019-07-19 DIAGNOSIS — Z7189 Other specified counseling: Secondary | ICD-10-CM

## 2019-07-19 DIAGNOSIS — C787 Secondary malignant neoplasm of liver and intrahepatic bile duct: Secondary | ICD-10-CM | POA: Diagnosis not present

## 2019-07-19 DIAGNOSIS — R160 Hepatomegaly, not elsewhere classified: Secondary | ICD-10-CM | POA: Diagnosis not present

## 2019-07-19 DIAGNOSIS — D3501 Benign neoplasm of right adrenal gland: Secondary | ICD-10-CM | POA: Diagnosis not present

## 2019-07-19 DIAGNOSIS — Z95828 Presence of other vascular implants and grafts: Secondary | ICD-10-CM

## 2019-07-19 DIAGNOSIS — Z5112 Encounter for antineoplastic immunotherapy: Secondary | ICD-10-CM | POA: Diagnosis not present

## 2019-07-19 LAB — CBC WITH DIFFERENTIAL (CANCER CENTER ONLY)
Abs Immature Granulocytes: 0.01 10*3/uL (ref 0.00–0.07)
Basophils Absolute: 0.1 10*3/uL (ref 0.0–0.1)
Basophils Relative: 1 %
Eosinophils Absolute: 0.5 10*3/uL (ref 0.0–0.5)
Eosinophils Relative: 7 %
HCT: 32.1 % — ABNORMAL LOW (ref 36.0–46.0)
Hemoglobin: 10.6 g/dL — ABNORMAL LOW (ref 12.0–15.0)
Immature Granulocytes: 0 %
Lymphocytes Relative: 37 %
Lymphs Abs: 2.5 10*3/uL (ref 0.7–4.0)
MCH: 29 pg (ref 26.0–34.0)
MCHC: 33 g/dL (ref 30.0–36.0)
MCV: 87.7 fL (ref 80.0–100.0)
Monocytes Absolute: 0.3 10*3/uL (ref 0.1–1.0)
Monocytes Relative: 4 %
Neutro Abs: 3.3 10*3/uL (ref 1.7–7.7)
Neutrophils Relative %: 51 %
Platelet Count: 204 10*3/uL (ref 150–400)
RBC: 3.66 MIL/uL — ABNORMAL LOW (ref 3.87–5.11)
RDW: 14.7 % (ref 11.5–15.5)
WBC Count: 6.6 10*3/uL (ref 4.0–10.5)
nRBC: 0 % (ref 0.0–0.2)

## 2019-07-19 LAB — CMP (CANCER CENTER ONLY)
ALT: 15 U/L (ref 0–44)
AST: 22 U/L (ref 15–41)
Albumin: 3.9 g/dL (ref 3.5–5.0)
Alkaline Phosphatase: 131 U/L — ABNORMAL HIGH (ref 38–126)
Anion gap: 9 (ref 5–15)
BUN: 14 mg/dL (ref 8–23)
CO2: 22 mmol/L (ref 22–32)
Calcium: 9.3 mg/dL (ref 8.9–10.3)
Chloride: 107 mmol/L (ref 98–111)
Creatinine: 1.2 mg/dL — ABNORMAL HIGH (ref 0.44–1.00)
GFR, Est AFR Am: 56 mL/min — ABNORMAL LOW (ref >60)
GFR, Estimated: 48 mL/min — ABNORMAL LOW (ref >60)
Glucose, Bld: 106 mg/dL — ABNORMAL HIGH (ref 70–99)
Potassium: 3.9 mmol/L (ref 3.5–5.1)
Sodium: 138 mmol/L (ref 135–145)
Total Bilirubin: 0.3 mg/dL (ref 0.3–1.2)
Total Protein: 7.1 g/dL (ref 6.5–8.1)

## 2019-07-19 MED ORDER — DIPHENHYDRAMINE HCL 50 MG/ML IJ SOLN
25.0000 mg | Freq: Once | INTRAMUSCULAR | Status: AC
  Administered 2019-07-19: 25 mg via INTRAVENOUS

## 2019-07-19 MED ORDER — DIPHENHYDRAMINE HCL 50 MG/ML IJ SOLN
INTRAMUSCULAR | Status: AC
  Filled 2019-07-19: qty 1

## 2019-07-19 MED ORDER — DIPHENHYDRAMINE HCL 25 MG PO CAPS
ORAL_CAPSULE | ORAL | Status: AC
  Filled 2019-07-19: qty 2

## 2019-07-19 MED ORDER — ACETAMINOPHEN 325 MG PO TABS
650.0000 mg | ORAL_TABLET | Freq: Once | ORAL | Status: AC
  Administered 2019-07-19: 650 mg via ORAL

## 2019-07-19 MED ORDER — HEPARIN SOD (PORK) LOCK FLUSH 100 UNIT/ML IV SOLN
500.0000 [IU] | Freq: Once | INTRAVENOUS | Status: AC | PRN
  Administered 2019-07-19: 500 [IU]
  Filled 2019-07-19: qty 5

## 2019-07-19 MED ORDER — SODIUM CHLORIDE 0.9 % IV SOLN
420.0000 mg | Freq: Once | INTRAVENOUS | Status: AC
  Administered 2019-07-19: 420 mg via INTRAVENOUS
  Filled 2019-07-19: qty 14

## 2019-07-19 MED ORDER — SODIUM CHLORIDE 0.9% FLUSH
10.0000 mL | INTRAVENOUS | Status: DC | PRN
  Administered 2019-07-19: 10 mL
  Filled 2019-07-19: qty 10

## 2019-07-19 MED ORDER — TRASTUZUMAB-DKST CHEMO 150 MG IV SOLR
6.0000 mg/kg | Freq: Once | INTRAVENOUS | Status: AC
  Administered 2019-07-19: 399 mg via INTRAVENOUS
  Filled 2019-07-19: qty 19

## 2019-07-19 MED ORDER — SODIUM CHLORIDE 0.9 % IV SOLN
Freq: Once | INTRAVENOUS | Status: AC
  Filled 2019-07-19: qty 250

## 2019-07-19 MED ORDER — ACETAMINOPHEN 325 MG PO TABS
ORAL_TABLET | ORAL | Status: AC
  Filled 2019-07-19: qty 2

## 2019-07-19 NOTE — Patient Instructions (Signed)

## 2019-07-19 NOTE — Patient Instructions (Signed)
[facility] Discharge Instructions for Patients Receiving Chemotherapy  Today you received the following chemotherapy agents Trastuzumab (OGIVRI) & Pertuzumab (PERJETA).  To help prevent nausea and vomiting after your treatment, we encourage you to take your nausea medication as prescribed.   If you develop nausea and vomiting that is not controlled by your nausea medication, call the clinic.   BELOW ARE SYMPTOMS THAT SHOULD BE REPORTED IMMEDIATELY:  *FEVER GREATER THAN 100.5 F  *CHILLS WITH OR WITHOUT FEVER  NAUSEA AND VOMITING THAT IS NOT CONTROLLED WITH YOUR NAUSEA MEDICATION  *UNUSUAL SHORTNESS OF BREATH  *UNUSUAL BRUISING OR BLEEDING  TENDERNESS IN MOUTH AND THROAT WITH OR WITHOUT PRESENCE OF ULCERS  *URINARY PROBLEMS  *BOWEL PROBLEMS  UNUSUAL RASH Items with * indicate a potential emergency and should be followed up as soon as possible.  Feel free to call the clinic should you have any questions or concerns. The clinic phone number is [phone].  Please show the [facility] at check-in to the Emergency Department and triage nurse.  Coronavirus (COVID-19) Are you at risk?  Are you at risk for the Coronavirus (COVID-19)?  To be considered HIGH RISK for Coronavirus (COVID-19), you have to meet the following criteria:  . Traveled to [place], [place], [place], [place] or [place]; or in the [place] to [place], [place], [place], or [place]; and have fever, cough, and shortness of breath within the last 2 weeks of travel OR . Been in close contact with a person diagnosed with COVID-19 within the last 2 weeks and have fever, cough, and shortness of breath . IF YOU DO NOT MEET THESE CRITERIA, YOU ARE CONSIDERED LOW RISK FOR COVID-19.  What to do if you are HIGH RISK for COVID-19?  Marland Kitchen If you are having a medical emergency, call 911. . Seek medical care right away. Before you go to a doctor's office, urgent care or emergency department, call  ahead and tell them about your recent travel, contact with someone diagnosed with COVID-19, and your symptoms. You should receive instructions from your physician's office regarding next steps of care.  . When you arrive at healthcare provider, tell the healthcare staff immediately you have returned from visiting [place], [place], [place], [place] or [place]; or traveled in the [place] to [place], [place], [place], or [place]; in the last two weeks or you have been in close contact with a person diagnosed with COVID-19 in the last 2 weeks.   . Tell the health care staff about your symptoms: fever, cough and shortness of breath. . After you have been seen by a medical provider, you will be either: o Tested for (COVID-19) and discharged home on quarantine except to seek medical care if symptoms worsen, and asked to  - Stay home and avoid contact with others until you get your results (4-5 days)  - Avoid travel on public transportation if possible (such as bus, train, or airplane) or o Sent to the Emergency Department by EMS for evaluation, COVID-19 testing, and possible admission depending on your condition and test results.  What to do if you are LOW RISK for COVID-19?  Reduce your risk of any infection by using the same precautions used for avoiding the common cold or flu:  Marland Kitchen Wash your hands often with soap and warm water for at least 20 seconds.  If soap and water are not readily available, use an alcohol-based hand sanitizer with at least 60% alcohol.  Marland Kitchen  If coughing or sneezing, cover your mouth and nose by coughing or sneezing into the elbow areas of your shirt or coat, into a tissue or into your sleeve (not your hands). . Avoid shaking hands with others and consider head nods or verbal greetings only. . Avoid touching your eyes, nose, or mouth with unwashed hands.  . Avoid close contact with people who are sick. . Avoid places or events with large numbers of people in one location,  like concerts or sporting events. . Carefully consider travel plans you have or are making. . If you are planning any travel outside or inside the [place], visit the CDC's Travelers' Health webpage for the latest health notices. . If you have some symptoms but not all symptoms, continue to monitor at home and seek medical attention if your symptoms worsen. . If you are having a medical emergency, call 911.   ADDITIONAL HEALTHCARE OPTIONS FOR PATIENTS  [facility] / e-Visit: [email or web address]         [facility]: [phone]  [facility]: [phone]                   [facility]: [phone]    

## 2019-07-23 MED FILL — LOSARTAN POTASSIUM 50 MG TA: 50 | 30 days supply | Qty: 30 | Fill #4

## 2019-07-23 MED FILL — SPIRONOLACTONE 25 MG TABS: 25 | 30 days supply | Qty: 30 | Fill #2

## 2019-07-23 MED FILL — OMEPRAZOLE 20 MG CAP: 20 | 30 days supply | Qty: 60 | Fill #1

## 2019-08-02 MED FILL — AMLODIPINE BESYLATE 5 MG TA: 5 | 90 days supply | Qty: 90 | Fill #0

## 2019-08-06 ENCOUNTER — Other Ambulatory Visit: Payer: Self-pay | Admitting: Hematology

## 2019-08-06 ENCOUNTER — Encounter: Payer: Self-pay | Admitting: Hematology

## 2019-08-06 MED FILL — DULOXETINE HCL 20 MG CPEP: 20 | 30 days supply | Qty: 30 | Fill #1

## 2019-08-06 MED FILL — LETROZOLE 2.5 MG TABLET: 2.5 | 90 days supply | Qty: 90 | Fill #0

## 2019-08-06 NOTE — Progress Notes (Signed)
Teresa Hays   Telephone:([phone] [phone]) [phone]   Clinic Follow up Note   Patient Care Team: Levin Bacon, NP as PCP - General (Family Medicine) [name], MD as Consulting Physician (Gastroenterology) [name], MD as Consulting Physician (Hematology)  Date of Service:  08/10/2019  CHIEF COMPLAINT: F/u for metastatic breast cancer  SUMMARY OF ONCOLOGIC HISTORY: Oncology History Overview Note  Cancer Staging Metastatic breast cancer [employer]) Staging form: Breast, AJCC 8th Edition - Clinical stage from 03/24/2018: Stage IV (cT2, cN1, pM1, G3, ER+, PR+, HER2+) - Signed by [name], MD on 03/30/2018     Metastatic breast cancer ([facility])  01/30/2018 Procedure   Colonoscopy showed small polyp in the sigmoid colon, removed, the exam of colon including the terminal ileum was otherwise negative.   01/30/2018 Procedure   EGD by Dr. Collene Mares showed small hiatal hernia, a 8 mm polypoid lesion in the cardia, biopsied.   03/09/2018 Imaging   03/09/2018 US Abdomen IMPRESSION: 1. Mass lesions throughout the liver, consistent with metastatic disease. Liver as a somewhat nodular contour suggesting underlying hepatic cirrhosis. Inhomogeneous echotexture to the liver.  2. Cholelithiasis with mild gallbladder wall thickening. A degree of cholecystitis cannot be excluded by ultrasound.  3. Portions of pancreas obscured by gas. Visualized portions of pancreas appear normal.  4. Small right kidney. Etiology uncertain. This finding potentially may be indicative of renal artery stenosis. In this regard, question whether patient is hypertensive.   03/15/2018 Imaging   CT CAP with contrast  IMPRESSION: 1. Widespread hepatic metastasis. 2. 2.6 cm lateral right breast soft tissue nodule could represent a breast primary or an incidental benign lesion. Consider correlation with mammogram and ultrasound. 3. No definite source of primary malignancy identified within the abdomen or  pelvis. There is possible rectosigmoid junction wall thickening. Consider colonoscopy with attention to this area. 4. Distal esophageal wall thickening, suggesting esophagitis.   03/24/2018 Cancer Staging   Staging form: Breast, AJCC 8th Edition - Clinical stage from 03/24/2018: Stage IV (cT2, cN1, pM1, G3, ER+, PR+, HER2+) - Signed by [name], MD on 03/30/2018   03/24/2018 Initial Biopsy   Diagnosis 1. 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 Breast, right, needle core biopsy, 9 o'clock, 7cm from nipple - INVASIVE DUCTAL CARCINOMA. -The carcinoma is somewhat morphologically dissimilar from that in part 1. It appears grade III 3. Lymph node, needle/core biopsy, right axillary - METASTATIC CARCINOMA IN 1 OF 1 LYMPH NODE (1/1).   03/24/2018 Receptors her2   Breast biopsy: 1. Estrogen Receptor: 40%, POSITIVE, STRONG-MODERATE STAINING INTENSITY Progesterone Receptor: 70%, POSITIVE, STRONG STAINING INTENSITY Proliferation Marker Ki67: 20% HER 2 equivocal by IHC 2+, POSITIVE by FISH, ratio 2.4 and copy #4.2  2. Estrogen Receptor: 60%, POSITIVE, MODERATE STAINING INTENSITY Progesterone Receptor: 40%, POSITIVE, MODERATE STAINING INTENSITY Proliferation Marker Ki67: 20% HER2 (+) by IHC 3+   03/24/2018 Initial Diagnosis   Metastatic breast cancer ([facility])   03/27/2018 Pathology Results   Diagnosis Liver, needle/core biopsy, Right - METASTATIC CARCINOMA TO LIVER, CONSISTENT WITH PATIENTS CLINICAL HISTORY OF PRIMARY BREAST CARCINOMA.  ER 80%+ PR40%+ HER2- (by [facility], IHC 2+)  Ki67 50%    03/28/2018 Pathology Results   03/28/2018 Surgical Pathology Diagnosis 1. Breast, left, needle core biopsy, 9 o'clock - FIBROCYSTIC CHANGES. - THERE IS NO EVIDENCE OF MALIGNANCY. 2. Breast, left, needle core biopsy, 2 o'clock - FIBROADENOMA. - THERE IS NO EVIDENCE OF  MALIGNANCY. - SEE COMMENT.   03/29/2018 Imaging    03/29/2018 Bone Scan IMPRESSION: No scintigraphic evidence of osseous metastatic disease.   03/30/2018 Imaging   Bone scan  IMPRESSION: No scintigraphic evidence of osseous metastatic disease.    04/07/2018 -  Chemotherapy   First line chemo weekly Taxol and herceptin/Perjeta every 3 weeks starting 04/07/18. Teresa Hays developed infusion reaction to taxol and it was discontinued. Added Abraxane on C1D8, 2 weeks on/1 week off.  Abraxne stopped after 10/13/18 due to worsening Neuropathy. Teresa Hays has continued with Herceptin injection/Perjeta q3weeks.    06/06/2018 Imaging   CT CAP IMPRESSION: 1. Generally improved appearance, with reduced axillary adenopathy and reduced enhancing component of the hepatic masses, with some of the hepatic mass is moderately smaller than on the prior exam. Reduced size of the right lateral breast mass compared to the prior 03/15/2018 exam. 2. New mild interstitial accentuation in the lungs, significance uncertain. Part of this appearance may be due to lower lung volumes on today's exam. 3. Mild wall thickening in the descending colon and upper rectum suggesting low-grade colitis/inflammation. Prominent stool throughout the colon favors constipation. 4. Other imaging findings of potential clinical significance: Aortic Atherosclerosis (ICD10-I70.0). Mild cardiomegaly. Mild nodularity in the right lower lobe appears stable. Contracted and thick-walled gallbladder.    09/18/2018 Imaging   CT CAP W Contrast 09/18/18  IMPRESSION: 1. Liver metastases have decreased in size. 2. Right breast mass, mild right axillary lymphadenopathy and scattered tiny right pulmonary nodules are all stable. 3. New mild left supraclavicular and left subpectoral lymphadenopathy, can not exclude progression of metastatic nodal disease. 4. Moderate colorectal stool volume, which may indicate constipation. 5.  Aortic Atherosclerosis (ICD10-I70.0).   10/2018 -  Anti-estrogen oral therapy   Letrozole 2.5  mg daily starting 10/2018   01/10/2019 Imaging   CT CAP W Contrast 01/10/19  IMPRESSION: 1. Continued improvement in the hepatic metastatic lesions which have reduced in size. 2. Stable mild left supraclavicular and subpectoral adenopathy. 3. Essentially stable small right lower lobe pulmonary nodule and separate small subpleural nodule along the right hemidiaphragm. Surveillance suggested. 4. Other imaging findings of potential clinical significance: Mild cardiomegaly. Mild circumferential distal esophageal wall thickening, the most common cause would be esophagitis. Airway thickening is present, suggesting bronchitis or reactive airways disease. Airway plugging in the lower lobes and in the right middle lobe. Stable small right adrenal adenoma.   05/15/2019 Imaging   CT CAP W Contrast 05/15/19  IMPRESSION: CT CHEST IMPRESSION   1. Similar appearance of borderline supraclavicular, axillary, and subpectoral adenopathy. 2. Improved and resolved right lower lobe pulmonary nodularity. 3. Esophageal air fluid level suggests dysmotility or gastroesophageal reflux.   CT ABDOMEN AND PELVIS IMPRESSION   1. Improved hepatic metastasis. 2. Small bowel mesenteric lymph nodes which are upper normal and mildly enlarged. Likely increased and similar as detailed above. Indeterminate. Recommend attention on follow-up. 3. Cholelithiasis. 4. Motion degradation throughout the lower chest and abdomen.      CURRENT THERAPY:  -First line weekly TaxolwithHerceptin/Perjeta every 3 weeks starting 04/07/18. Teresa Hays developed infusion reaction to taxol and it was discontinued. Changed to Abraxaneweekly2 weeks on/1 week offfrom 07/21/2018.Abraxane held after October 13, 2018 due to his worsening neuropathy.Teresa Hays will continue Herceptin injection/Perjeta q3weeks maintenance therapy.  -Letrozole 2.5 mg daily starting 10/2018   INTERVAL HISTORY:  Teresa Hays is here for a follow up and treatment. Teresa Hays  presents to the clinic with family member. Teresa Hays notes burning around vaginal opening when Teresa Hays takes shower.  Teresa Hays denies dysuria. Teresa Hays denies vaginal discharge or itching or blister, skin opening or ulcer around her genitals. Teresa Hays attributes to her body wash. Teresa Hays notes having cramping of her lateral right foot only when Teresa Hays walks around. Teresa Hays also notes b/l leg cramps nightly. Teresa Hays only takes multivitamin and prenatal vitamin.  Teresa Hays notes Teresa Hays has been tolerating Herceptin/Perjeta well with mild to moderate diarrhea intermittently. Teresa Hays tolerated letrozole with no issues.    REVIEW OF SYSTEMS:   Constitutional: Denies fevers, chills or abnormal weight loss Eyes: Denies blurriness of vision Ears, nose, mouth, throat, and face: Denies mucositis or sore throat Respiratory: Denies cough, dyspnea or wheezes Cardiovascular: Denies palpitation, chest discomfort or lower extremity swelling Gastrointestinal:  Denies nausea, heartburn (+) intermittent Diarrhea  UA: (+) itching around vaginal opening with shower  Skin: Denies abnormal skin rashes MSK: (+) cramping of 5th digit of right foot  (+) b/l leg cramps   Lymphatics: Denies new lymphadenopathy or easy bruising Neurological:Denies numbness, tingling or new weaknesses Behavioral/Psych: Mood is stable, no new changes  All other systems were reviewed with the patient and are negative.  MEDICAL HISTORY:  Past Medical History:  Diagnosis Date  . GERD (gastroesophageal reflux disease)   . Hypertension   . rt breast ca with mets to liver dx'd 03/2018    SURGICAL HISTORY: Past Surgical History:  Procedure Laterality Date  . COLONOSCOPY    . ESOPHAGOGASTRODUODENOSCOPY ENDOSCOPY    . IR IMAGING GUIDED PORT INSERTION  04/04/2018    I have reviewed the social history and family history with the patient and they are unchanged from previous note.  ALLERGIES:  has No Known Allergies.  MEDICATIONS:  Current Outpatient Medications  Medication Sig Dispense  Refill  . amLODipine (NORVASC) 5 MG tablet Take 5 mg by mouth daily.     . carvedilol (COREG) 3.125 MG tablet Take 1 tablet (3.125 mg total) by mouth 2 (two) times daily. 180 tablet 3  . chlorpheniramine-HYDROcodone (TUSSIONEX) 10-8 MG/5ML SUER Take 5 mLs by mouth at bedtime as needed for cough. 115 mL 0  . diphenoxylate-atropine (LOMOTIL) 2.5-0.025 MG tablet Take 1-2 tablets by mouth 4 (four) times daily as needed for diarrhea or loose stools. 60 tablet 1  . DULoxetine (CYMBALTA) 20 MG capsule TAKE 1 CAPSULE BY MOUTH ONCE DAILY 30 capsule 1  . gabapentin (NEURONTIN) 300 MG capsule Patient is to take one 300 mg capsule in am, one 300 mg capsule midday and three 300 mg capsules at bedtime. (Patient taking differently: 300 mg 5 (five) times daily. Patient is to take one 300 mg capsule in am, one 300 mg capsule midday and three 300 mg capsules at bedtime.) 150 capsule 3  . letrozole (FEMARA) 2.5 MG tablet TAKE 1 TABLET (2.5 MG TOTAL) BY MOUTH DAILY. 90 tablet 1  . lidocaine-prilocaine (EMLA) cream Apply to affected area once 30 g 3  . LORazepam (ATIVAN) 0.5 MG tablet Take 1 tablet (0.5 mg total) by mouth at bedtime as needed for anxiety. 30 tablet 0  . losartan (COZAAR) 50 MG tablet Take 1 tablet (50 mg total) by mouth daily. 90 tablet 3  . omeprazole (PRILOSEC) 20 MG capsule Take 1 capsule (20 mg total) by mouth 2 (two) times daily before a meal. 60 capsule 2  . potassium chloride SA (KLOR-CON) 20 MEQ tablet TAKE 1 TABLET BY MOUTH ONCE DAILY 30 tablet 0  . spironolactone (ALDACTONE) 25 MG tablet Take 1 tablet (25 mg total) by mouth daily. 30 tablet 6  .  traMADol (ULTRAM) 50 MG tablet Take 1 tablet (50 mg total) by mouth every 6 (six) hours as needed for moderate pain or severe pain. 30 tablet 0  . XARELTO 20 MG TABS tablet TAKE 1 TABLET (20 MG TOTAL) BY MOUTH DAILY WITH SUPPER. 30 tablet 5   No current facility-administered medications for this visit.    PHYSICAL EXAMINATION: ECOG PERFORMANCE  STATUS: 1 - Symptomatic but completely ambulatory  Vitals:   08/10/19 0847  BP: (!) 141/63  Pulse: 66  Resp: 20  Temp: 98.2 F (36.8 C)  SpO2: 99%   Filed Weights   08/10/19 0847  Weight: 143 lb 3.2 oz (65 kg)     GENERAL:alert, no distress and comfortable SKIN: skin color, texture, turgor are normal, no rashes or significant lesions EYES: normal, Conjunctiva are pink and non-injected, sclera clear  NECK: supple, thyroid normal size, non-tender, without nodularity LYMPH:  no palpable lymphadenopathy in the cervical, axillary  LUNGS: clear to auscultation and percussion with normal breathing effort HEART: regular rate & rhythm and no murmurs and no lower extremity edema ABDOMEN:abdomen soft, non-tender and normal bowel sounds Musculoskeletal:no cyanosis of digits and no clubbing (+) Tenderness of anterior right foot fifth digit  NEURO: alert & oriented x 3 with fluent speech, no focal motor/sensory deficits  LABORATORY DATA:  I have reviewed the data as listed CBC Latest Ref Rng & Units 08/10/2019 07/19/2019 06/29/2019  WBC 4.0 - 10.5 K/uL 6.4 6.6 6.6  Hemoglobin 12.0 - 15.0 g/dL 10.4(L) 10.6(L) 10.8(L)  Hematocrit 36.0 - 46.0 % 32.1(L) 32.1(L) 33.6(L)  Platelets 150 - 400 K/uL 193 204 196     CMP Latest Ref Rng & Units 08/10/2019 07/19/2019 06/29/2019  Glucose 70 - 99 mg/dL 112(H) 106(H) 100(H)  BUN 8 - 23 mg/dL 18 14 24(H)  Creatinine 0.44 - 1.00 mg/dL 1.23(H) 1.20(H) 1.35(H)  Sodium 135 - 145 mmol/L 139 138 137  Potassium 3.5 - 5.1 mmol/L 4.0 3.9 4.2  Chloride 98 - 111 mmol/L 105 107 105  CO2 22 - 32 mmol/L _0 Calcium 8.9 - 10.3 mg/dL 9.0 9.3 9.5  Total Protein 6.5 - 8.1 g/dL 7.1 7.1 7.6  Total Bilirubin 0.3 - 1.2 mg/dL 0.3 0.3 0.3  Alkaline Phos 38 - 126 U/L 128(H) 131(H) 133(H)  AST 15 - 41 U/L _1 ALT 0 - 44 U/L _2 RADIOGRAPHIC STUDIES: I have personally reviewed the radiological images as listed and agreed with the findings in the  report. No results found.   ASSESSMENT & PLAN:  RHONNA HOLSTER is a 64 y.o. female with    1. 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 Liver met was HER2-.  -Given the metastatic disease, her cancer is not curable but still treatable. Shehas been treated withLetrozole daily and First-line Herceptin/Perjetaevery 3 weeks and weekly Taxol. Due to infusionreaction, taxol was switched to Abraxane. We stoppedAbraxaneafter cycle 10 due to worsening neuropathyand continued with Herceptin/Perjeta as maintenance therapy.  -Teresa Hays is clinically doing well and stable from a breast cancer standpoint. Teresa Hays continues to tolerate treatment well with mild to moderate diarrhea intermittently from Perjeta. I recommend Teresa Hays use imodium and remain hydrated.   -Physical exam today unremarkable, no palpable LN  or breast mass. Labs reviewed, CBC and CMP WNL except mild anemia, CMP and Ca 27.29 still pending. Overall adequate to proceed with Herceptin/Perjeta today and continue q3weeks.  -I discussed we will continue maintenance therapy for as long as it controls her disease. We briefly reviewed future line treatment options. Plan for next scan in early March.  -Continue Letrozole, tolerating well with no issues.  -Teresa Hays notes Teresa Hays is interested in b/l mastectomy. I discussed if her cancer is very well controlled, we may consider right breast surgery and targeted axillary node dissection. Will revisit later this year.  -F/u in 6 weeks with restaging CT   2.Peripheral neuropathy, grade 2 -Secondarytochemotherapy,mainly in her feet -Ipreviouslystropped Abraxane on 10/13/18. -Teresa Hays is currently on Oral B12,Gabapentin and  Tramadol every other night and low dose Cymbalta.  -Teresa Hays has tried PT twice and does not feel it helps as much as Teresa Hays does not feel like a fall risk.   -Tingling in fingers are stable and manageable.   4.Left UEDVT, Right LE edema -Teresa Hays developed LUE DVT on 08/04/2018, edema has resolved after anticoagulation -Continue onXarelto 71m indefinitely, unless Teresa Hays has significant bleeding or other side effects. -Shepreviouslydeveloped LE edema, right significantly more than left.10/13/18 doppler was negative for DVT.  -Ipreviouslyput her on 1 week dose of Lasix and potassium -Right LE edema resolved.   5. Anemia -Teresa Hays developed worseningwhen Teresa Hays was on Chemo, now Teresa Hays is off.  -Continue monitoring, will consider blood transfusion if hemoglobin less than 8.  6Social support  -Teresa Hays lives alone, [place] away  -On Ativan PRN due to anxietyabout diagnosis and treatment  7. Goal of care discussion  -Teresa Hays is full code -Teresa Hays understands the goal of care is palliative, to prolong her life, and improve her quality of life.  8. Recurrentdyspnea and wheezing  -Teresa Hays has been seen by Dr. IValeta Harmswho notes this is related to her post nasal drip and her Acid reflux can contribute.  -Her Prilosec was increased to twice daily. Teresa Hays will continue to f/u with Dr IValeta Harms  -much improved lately    9. B/l leg cramps, Right foot pain  -I recommend Teresa Hays see podiatrist for her foot pain and 5th digit tenderness of right foot.  -I also recommend Teresa Hays start OTC calcium and Vit D to help with cramping    Plan: -Labs reviewed and adequate to proceed with Herceptin/Perjeta today  -continue letrozole -lab, flush, and Herceptin/Perjeta in 3 weeks  -F/u in 6 weekswith lab, flush and CT CAP W contrast a few days before    No problem-specific Assessment & Plan notes found for this encounter.   Orders Placed This Encounter  Procedures  .  CT Abdomen Pelvis W Contrast    Standing Status:   Future    Standing Expiration Date:   08/09/2020    Order Specific Question:   If indicated for the ordered procedure, I authorize the administration of contrast media per Radiology protocol    Answer:   Yes    Order Specific Question:   [facility]    [facility]   Order Specific Question:   Release to patient    Answer:   Immediate    Order Specific Question:   Is Oral Contrast requested for this exam?    Answer:   Yes, Per Radiology protocol    Order Specific Question:   Radiology Contrast Protocol - do  NOT remove file path    Answer:   [email or web address]  . CT Chest W Contrast    Hold iv contrast if EGFR<45    Standing Status:   Future    Standing Expiration Date:   08/09/2020    Order Specific Question:   If indicated for the ordered procedure, I authorize the administration of contrast media per Radiology protocol    Answer:   Yes    Order Specific Question:   Preferred imaging location?    Answer:   [facility]    Order Specific Question:   Radiology Contrast Protocol - do NOT remove file path    Answer:   [email or web address]  . Ambulatory referral to Podiatry    Referral Priority:   Routine    Referral Type:   Consultation    Referral Reason:   Specialty Services Required    Requested Specialty:   Podiatry    Number of Visits Requested:   1   All questions were answered. The patient knows to call the clinic with any problems, questions or concerns. No barriers to learning was detected. The total time spent in the appointment was 30 minutes.     [name], MD 08/10/2019   I, Joslyn Devon, am acting as scribe for [name], MD.   I have reviewed the above documentation for accuracy and completeness, and I agree with the above.

## 2019-08-09 ENCOUNTER — Telehealth: Payer: Self-pay | Admitting: Hematology

## 2019-08-09 ENCOUNTER — Telehealth: Payer: Self-pay

## 2019-08-09 NOTE — Telephone Encounter (Signed)
I returned pt's call, she has dysuria, not sure UTI vs vaginal yeast infection. She has appointment with me tomorrow, I will check her urine.  I encouraged her to drink more water and cranberry juice tonight. She agrees with the plan.  [name]  08/09/2019

## 2019-08-09 NOTE — Telephone Encounter (Signed)
Patient calls stating she has a bad vaginal yeast infection and is wondering if Dr. Burr Medico will send in something to [facility] to treat this.  [phone]

## 2019-08-10 ENCOUNTER — Inpatient Hospital Stay: Payer: 59

## 2019-08-10 ENCOUNTER — Encounter: Payer: Self-pay | Admitting: Hematology

## 2019-08-10 ENCOUNTER — Inpatient Hospital Stay: Payer: 59 | Attending: Hematology

## 2019-08-10 ENCOUNTER — Inpatient Hospital Stay (HOSPITAL_BASED_OUTPATIENT_CLINIC_OR_DEPARTMENT_OTHER): Payer: 59 | Admitting: Hematology

## 2019-08-10 ENCOUNTER — Other Ambulatory Visit: Payer: Self-pay

## 2019-08-10 VITALS — BP 130/87 | HR 64 | Temp 97.9°F | Resp 18

## 2019-08-10 VITALS — BP 141/63 | HR 66 | Temp 98.2°F | Resp 20 | Ht 64.0 in | Wt 143.2 lb

## 2019-08-10 DIAGNOSIS — R202 Paresthesia of skin: Secondary | ICD-10-CM | POA: Diagnosis not present

## 2019-08-10 DIAGNOSIS — I700 Atherosclerosis of aorta: Secondary | ICD-10-CM | POA: Insufficient documentation

## 2019-08-10 DIAGNOSIS — G620 Drug-induced polyneuropathy: Secondary | ICD-10-CM | POA: Diagnosis not present

## 2019-08-10 DIAGNOSIS — C787 Secondary malignant neoplasm of liver and intrahepatic bile duct: Secondary | ICD-10-CM | POA: Insufficient documentation

## 2019-08-10 DIAGNOSIS — N270 Small kidney, unilateral: Secondary | ICD-10-CM | POA: Insufficient documentation

## 2019-08-10 DIAGNOSIS — M79671 Pain in right foot: Secondary | ICD-10-CM | POA: Diagnosis not present

## 2019-08-10 DIAGNOSIS — Z79899 Other long term (current) drug therapy: Secondary | ICD-10-CM | POA: Insufficient documentation

## 2019-08-10 DIAGNOSIS — C50919 Malignant neoplasm of unspecified site of unspecified female breast: Secondary | ICD-10-CM

## 2019-08-10 DIAGNOSIS — K8010 Calculus of gallbladder with chronic cholecystitis without obstruction: Secondary | ICD-10-CM | POA: Diagnosis not present

## 2019-08-10 DIAGNOSIS — Z86718 Personal history of other venous thrombosis and embolism: Secondary | ICD-10-CM | POA: Insufficient documentation

## 2019-08-10 DIAGNOSIS — R197 Diarrhea, unspecified: Secondary | ICD-10-CM | POA: Insufficient documentation

## 2019-08-10 DIAGNOSIS — R0600 Dyspnea, unspecified: Secondary | ICD-10-CM | POA: Insufficient documentation

## 2019-08-10 DIAGNOSIS — Z7189 Other specified counseling: Secondary | ICD-10-CM

## 2019-08-10 DIAGNOSIS — C50412 Malignant neoplasm of upper-outer quadrant of left female breast: Secondary | ICD-10-CM | POA: Diagnosis present

## 2019-08-10 DIAGNOSIS — R918 Other nonspecific abnormal finding of lung field: Secondary | ICD-10-CM | POA: Diagnosis not present

## 2019-08-10 DIAGNOSIS — D649 Anemia, unspecified: Secondary | ICD-10-CM | POA: Insufficient documentation

## 2019-08-10 DIAGNOSIS — R609 Edema, unspecified: Secondary | ICD-10-CM | POA: Diagnosis not present

## 2019-08-10 DIAGNOSIS — K2100 Gastro-esophageal reflux disease with esophagitis, without bleeding: Secondary | ICD-10-CM | POA: Diagnosis not present

## 2019-08-10 DIAGNOSIS — T451X5A Adverse effect of antineoplastic and immunosuppressive drugs, initial encounter: Secondary | ICD-10-CM | POA: Diagnosis not present

## 2019-08-10 DIAGNOSIS — R0982 Postnasal drip: Secondary | ICD-10-CM | POA: Insufficient documentation

## 2019-08-10 DIAGNOSIS — Z5112 Encounter for antineoplastic immunotherapy: Secondary | ICD-10-CM | POA: Insufficient documentation

## 2019-08-10 DIAGNOSIS — R062 Wheezing: Secondary | ICD-10-CM | POA: Diagnosis not present

## 2019-08-10 DIAGNOSIS — D6481 Anemia due to antineoplastic chemotherapy: Secondary | ICD-10-CM

## 2019-08-10 DIAGNOSIS — R252 Cramp and spasm: Secondary | ICD-10-CM | POA: Insufficient documentation

## 2019-08-10 DIAGNOSIS — R590 Localized enlarged lymph nodes: Secondary | ICD-10-CM | POA: Insufficient documentation

## 2019-08-10 DIAGNOSIS — Z170 Estrogen receptor positive status [ER+]: Secondary | ICD-10-CM | POA: Insufficient documentation

## 2019-08-10 DIAGNOSIS — D3501 Benign neoplasm of right adrenal gland: Secondary | ICD-10-CM | POA: Diagnosis not present

## 2019-08-10 DIAGNOSIS — Z7901 Long term (current) use of anticoagulants: Secondary | ICD-10-CM | POA: Diagnosis not present

## 2019-08-10 DIAGNOSIS — Z95828 Presence of other vascular implants and grafts: Secondary | ICD-10-CM

## 2019-08-10 DIAGNOSIS — Z79811 Long term (current) use of aromatase inhibitors: Secondary | ICD-10-CM | POA: Insufficient documentation

## 2019-08-10 LAB — CMP (CANCER CENTER ONLY)
ALT: 13 U/L (ref 0–44)
AST: 20 U/L (ref 15–41)
Albumin: 3.9 g/dL (ref 3.5–5.0)
Alkaline Phosphatase: 128 U/L — ABNORMAL HIGH (ref 38–126)
Anion gap: 11 (ref 5–15)
BUN: 18 mg/dL (ref 8–23)
CO2: 23 mmol/L (ref 22–32)
Calcium: 9 mg/dL (ref 8.9–10.3)
Chloride: 105 mmol/L (ref 98–111)
Creatinine: 1.23 mg/dL — ABNORMAL HIGH (ref 0.44–1.00)
GFR, Est AFR Am: 54 mL/min — ABNORMAL LOW (ref >60)
GFR, Estimated: 47 mL/min — ABNORMAL LOW (ref >60)
Glucose, Bld: 112 mg/dL — ABNORMAL HIGH (ref 70–99)
Potassium: 4 mmol/L (ref 3.5–5.1)
Sodium: 139 mmol/L (ref 135–145)
Total Bilirubin: 0.3 mg/dL (ref 0.3–1.2)
Total Protein: 7.1 g/dL (ref 6.5–8.1)

## 2019-08-10 LAB — CBC WITH DIFFERENTIAL (CANCER CENTER ONLY)
Abs Immature Granulocytes: 0.01 10*3/uL (ref 0.00–0.07)
Basophils Absolute: 0.1 10*3/uL (ref 0.0–0.1)
Basophils Relative: 1 %
Eosinophils Absolute: 0.5 10*3/uL (ref 0.0–0.5)
Eosinophils Relative: 8 %
HCT: 32.1 % — ABNORMAL LOW (ref 36.0–46.0)
Hemoglobin: 10.4 g/dL — ABNORMAL LOW (ref 12.0–15.0)
Immature Granulocytes: 0 %
Lymphocytes Relative: 37 %
Lymphs Abs: 2.3 10*3/uL (ref 0.7–4.0)
MCH: 28.5 pg (ref 26.0–34.0)
MCHC: 32.4 g/dL (ref 30.0–36.0)
MCV: 87.9 fL (ref 80.0–100.0)
Monocytes Absolute: 0.3 10*3/uL (ref 0.1–1.0)
Monocytes Relative: 4 %
Neutro Abs: 3.2 10*3/uL (ref 1.7–7.7)
Neutrophils Relative %: 50 %
Platelet Count: 193 10*3/uL (ref 150–400)
RBC: 3.65 MIL/uL — ABNORMAL LOW (ref 3.87–5.11)
RDW: 14.8 % (ref 11.5–15.5)
WBC Count: 6.4 10*3/uL (ref 4.0–10.5)
nRBC: 0 % (ref 0.0–0.2)

## 2019-08-10 IMAGING — MG STEREOTACTIC CORE NEEDLE BIOPSY
8 of 13 series · 8 of 29 positions shown · non-contrast
Comparison: Previous exams.

ADDENDUM:
Pathology revealed BENIGN BREAST TISSUE WITH MILD FIBROCYSTIC CHANGE
of the Left breast, upper outer quadrant. This was found to be
concordant by Dr. Blondinacka Samsonaite.

Pathology results were discussed with the patient by telephone. The
patient reported doing well after the biopsy with tenderness at the
site. Post biopsy instructions and care were reviewed and questions
were answered. The patient was encouraged to call The [REDACTED]
The patient has a recent diagnosis of right breast cancer and should
follow her outlined treatment plan. Dr. Einsteng Goriva was notified of
biopsy results via [REDACTED] message on March 31, 2018.
Pathology results reported by Djoeke Sebel, RN on 04/03/2018.
CLINICAL DATA: 61-year-old female presenting for stereotactic
biopsy of a left breast mass.
EXAM:
LEFT BREAST STEREOTACTIC CORE NEEDLE BIOPSY

[L (1 of 6)]
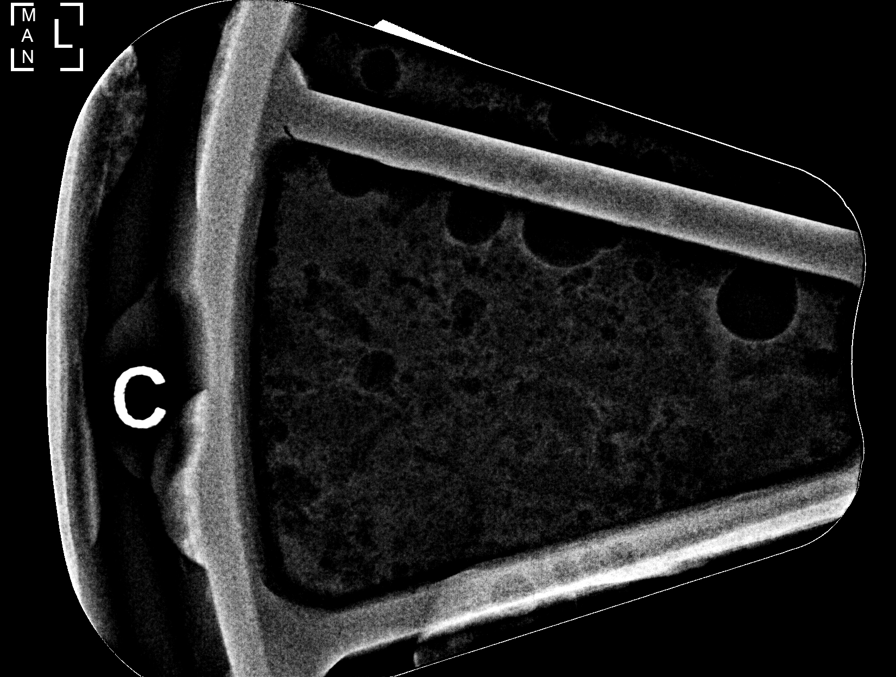

[L (2 of 6)]
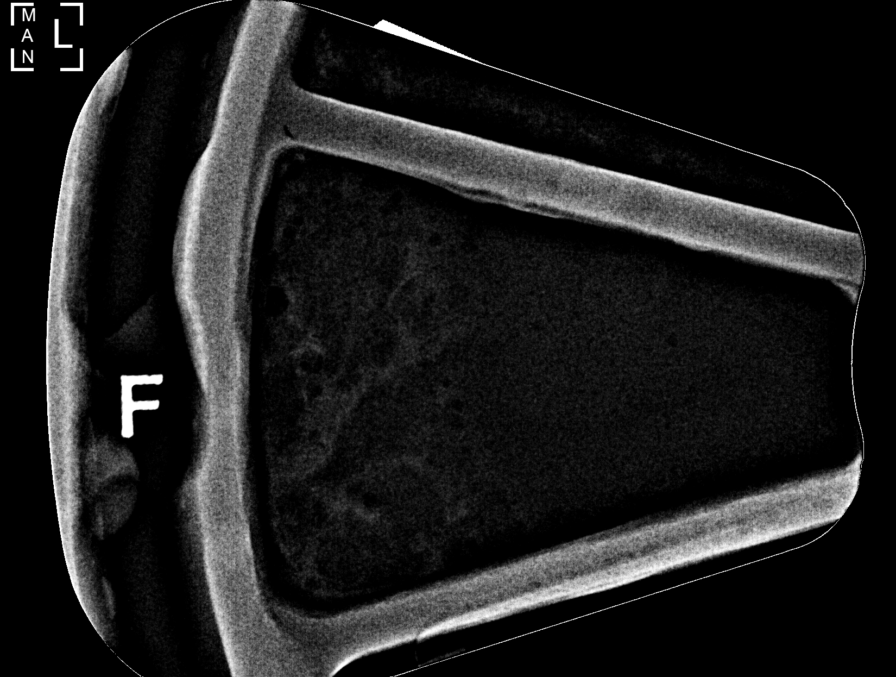

[L (3 of 6)]
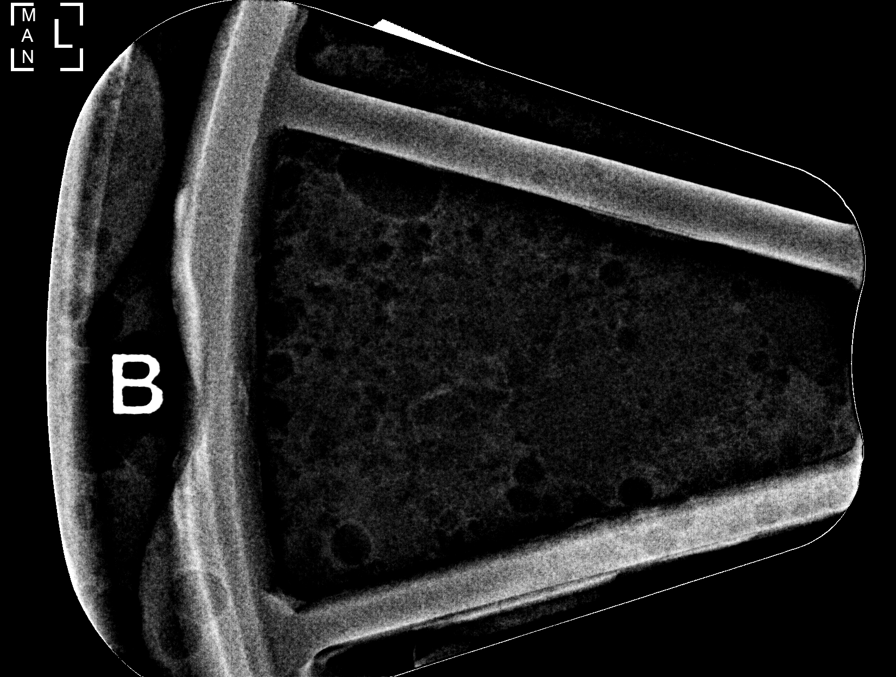

[L (4 of 6)]
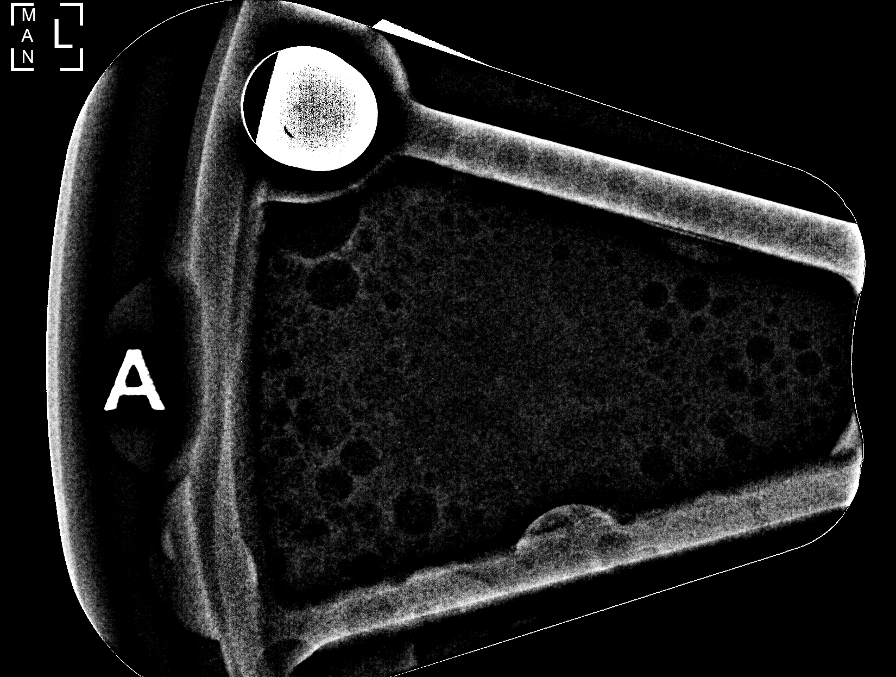

[L (5 of 6)]
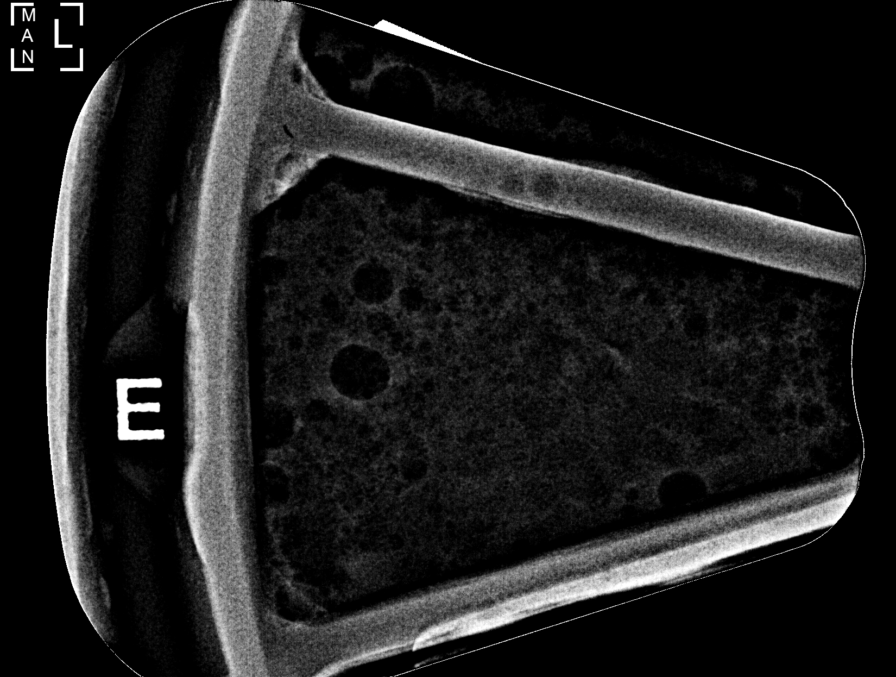

[L (6 of 6)]
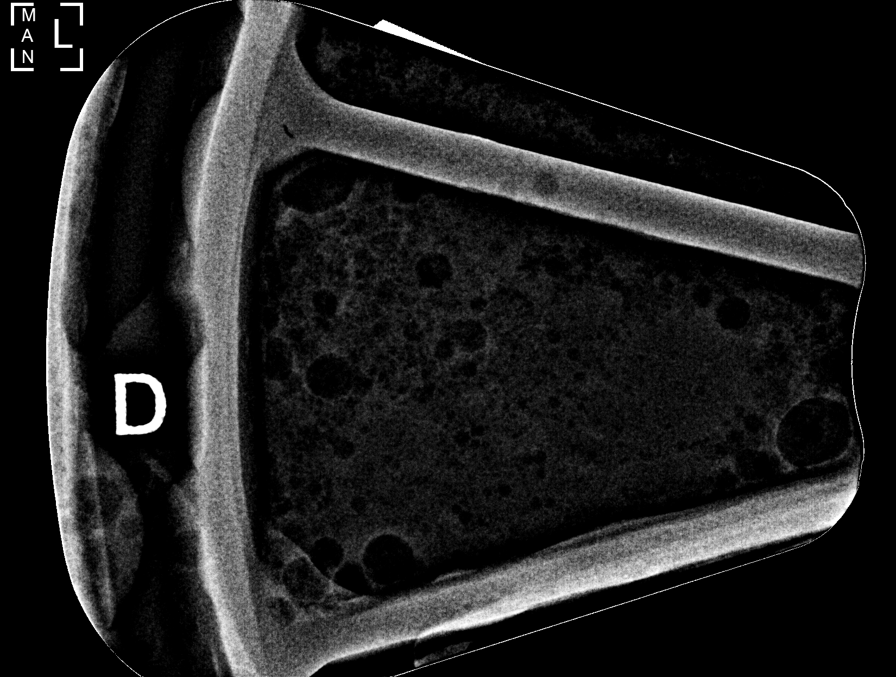

[L CC (1 of 2)]
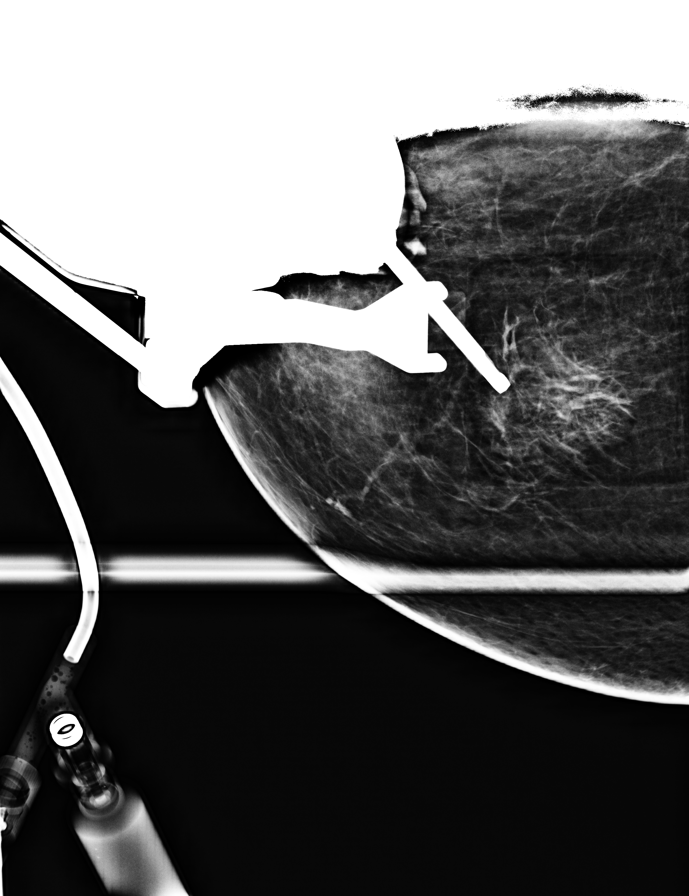

[L CC (2 of 2)]
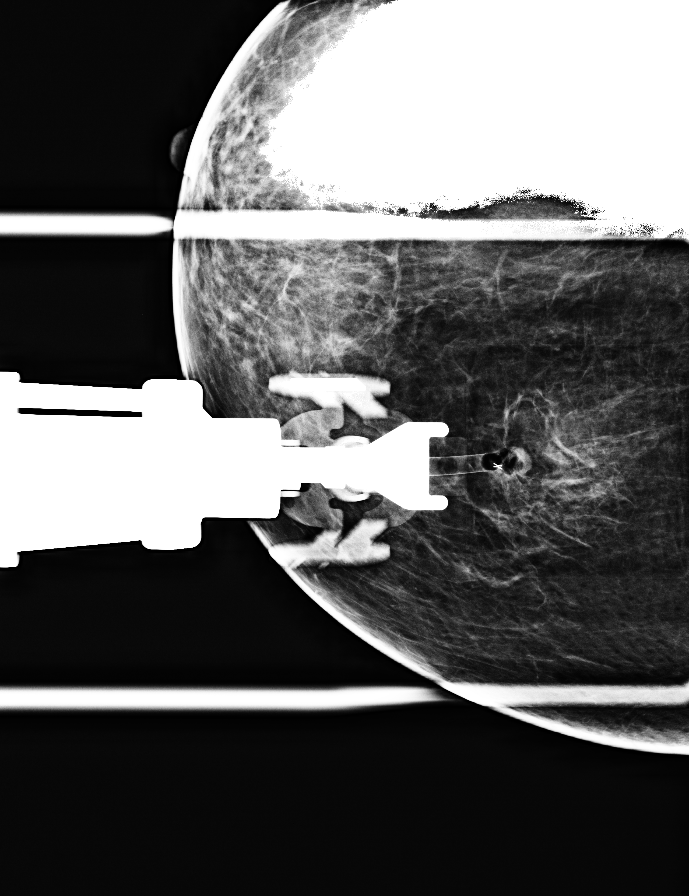

[8 of 29 positions shown; findings below may reference images not displayed]



Using sterile technique and 1% Lidocaine as local anesthetic, under
stereotactic guidance, a 9 gauge vacuum assisted device was used to
perform core needle biopsy of a mass in the upper-outer quadrant of
the left breast using a superior approach.

Lesion quadrant: Upper-outer quadrant

At the conclusion of the procedure, a X shaped tissue marker clip
was deployed into the biopsy cavity. Follow-up 2-view mammogram was
performed and dictated separately.
IMPRESSION: Stereotactic-guided biopsy of a mass in the left breast in the
upper-outer quadrant. No apparent complications.

## 2019-08-10 IMAGING — MG MM CLIP PLACEMENT
2 series · 2 of 2 positions shown · non-contrast
Comparison: Previous exam(s).

CLINICAL DATA: Post biopsy mammogram of the left breast for clip
placement.

EXAM:
DIAGNOSTIC LEFT MAMMOGRAM POST STEREOTACTIC BIOPSY

[L CC]
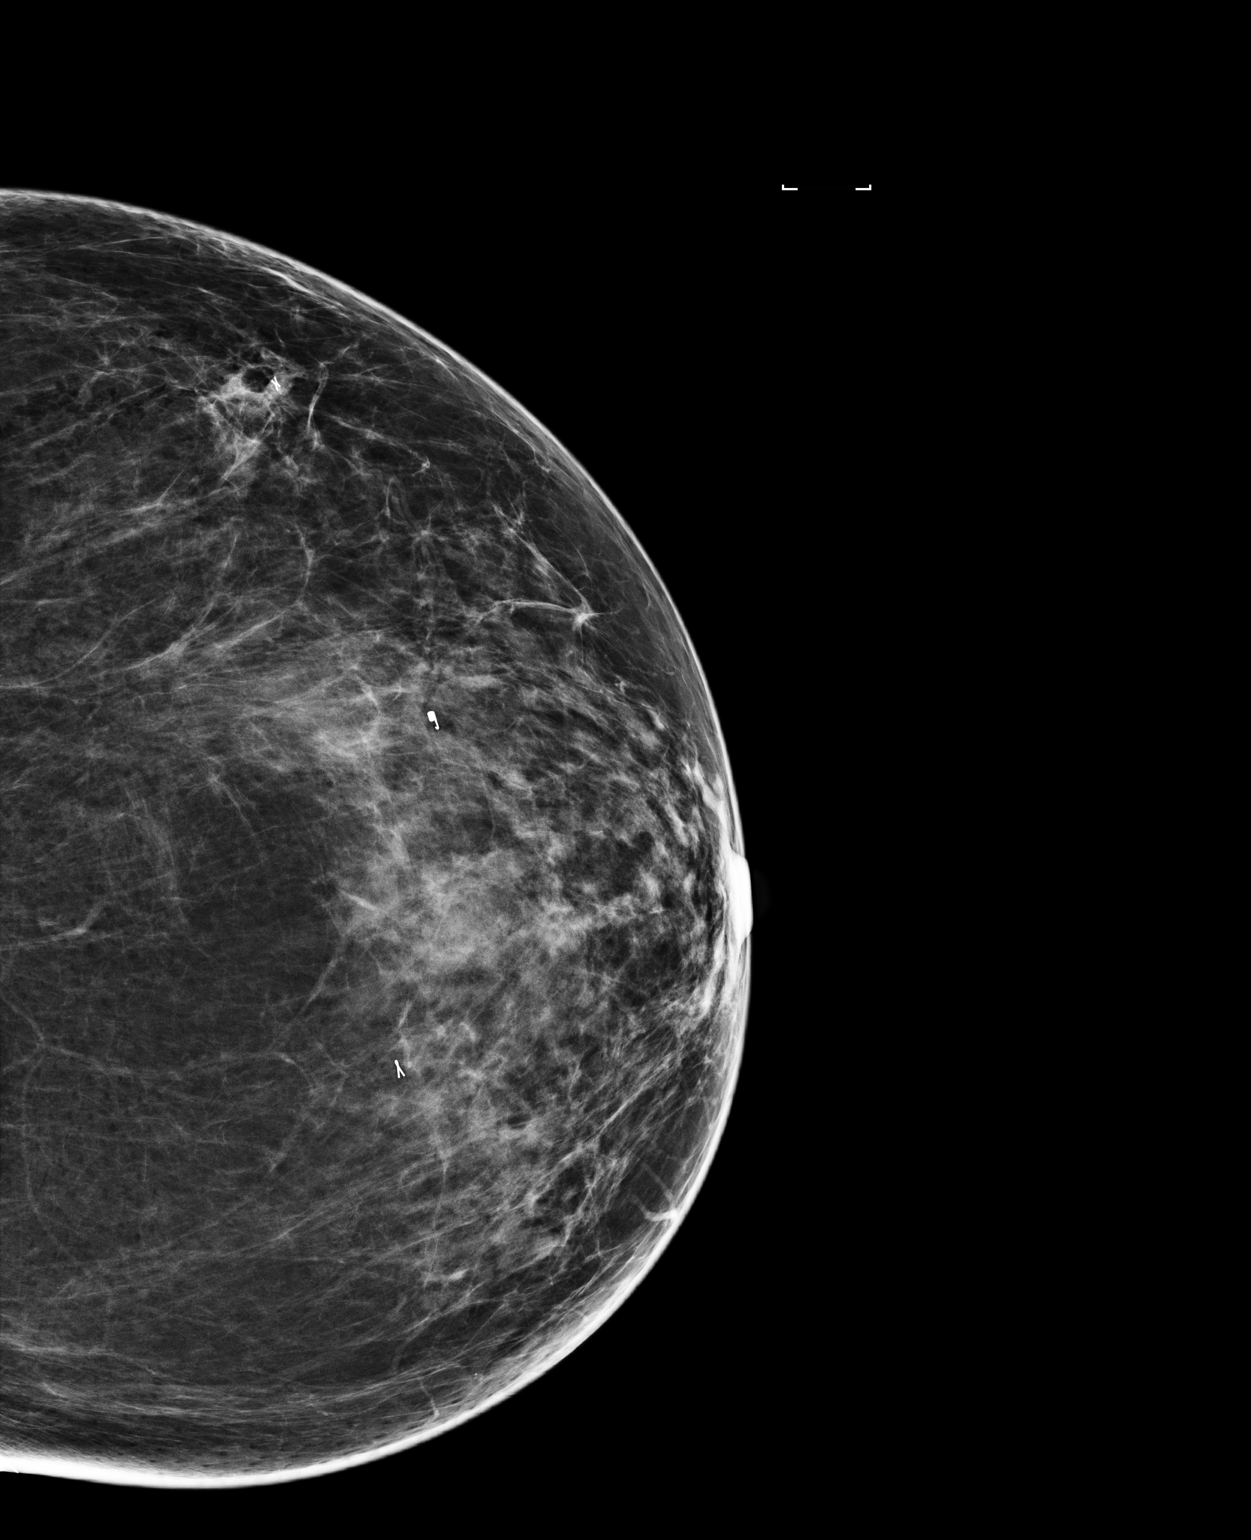

[L ML]
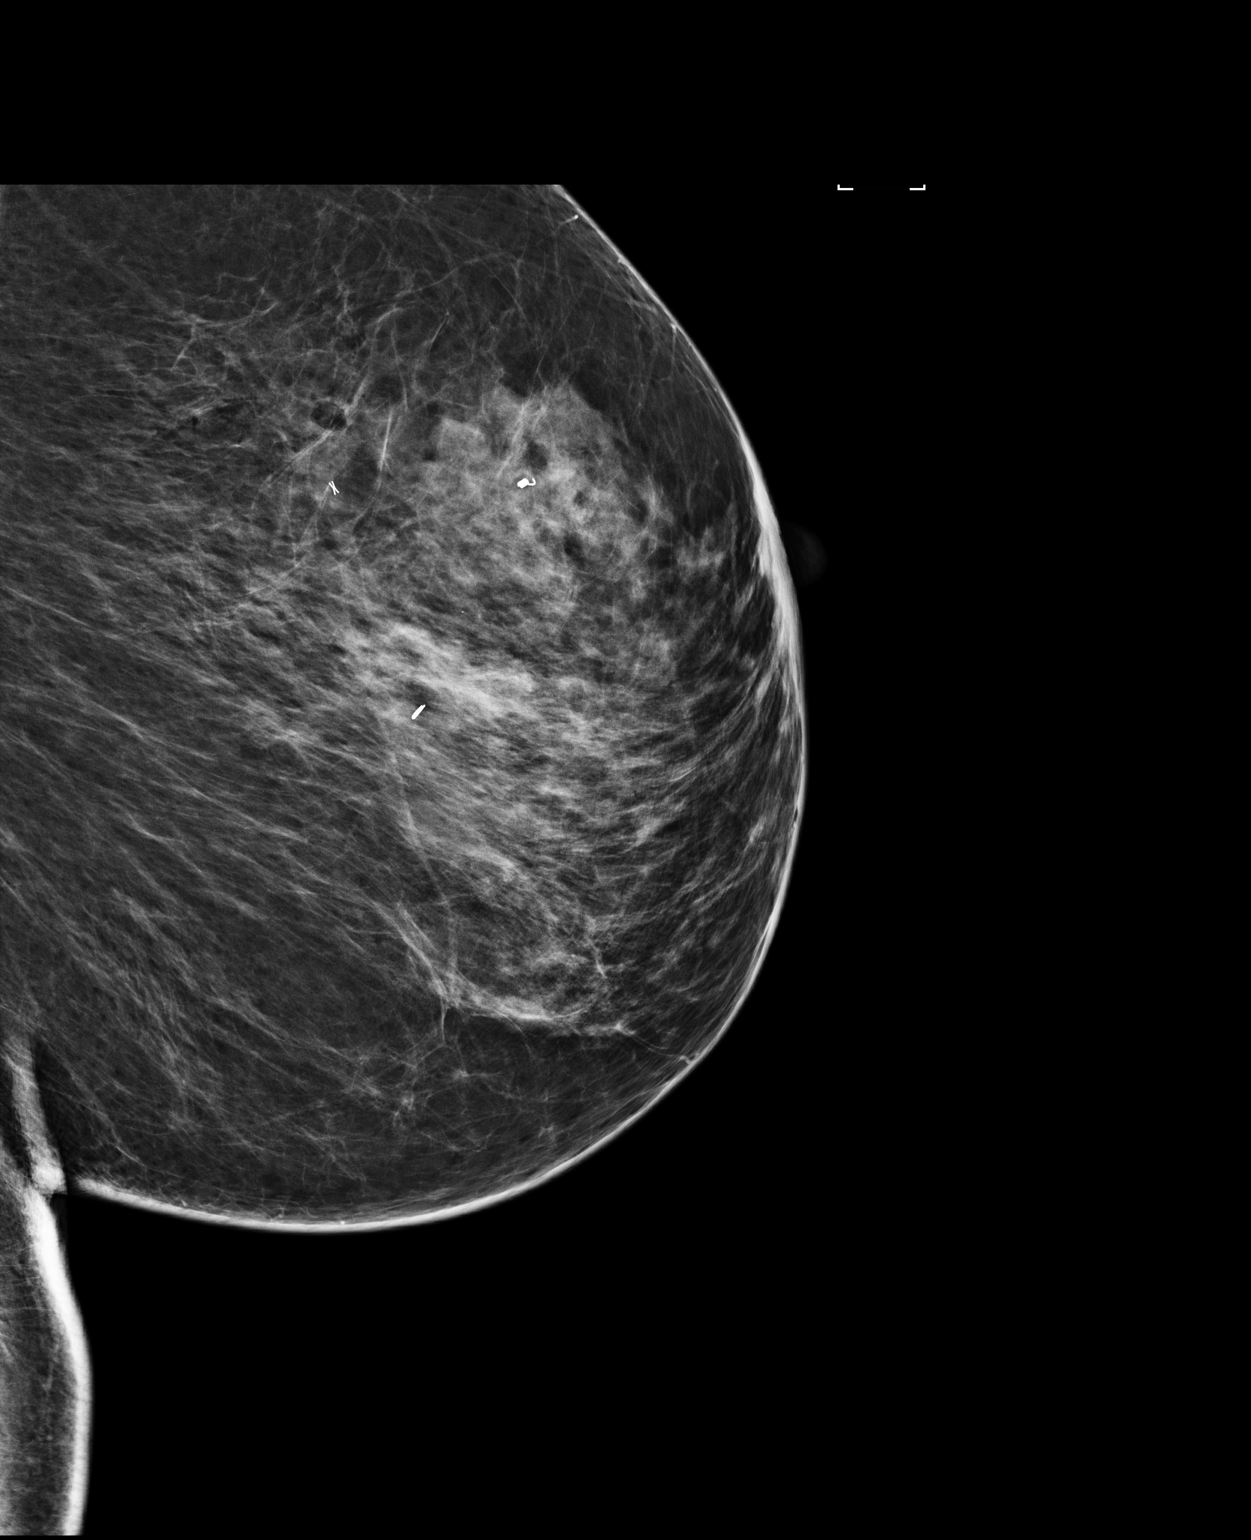

[2 of 2 positions shown; findings below may reference images not displayed]

FINDINGS: Mammographic images were obtained following stereotactic guided
biopsy of a mass in the upper-outer left breast. The X shaped biopsy
marking clip is well positioned at the site of biopsy in the
upper-outer left breast.
IMPRESSION: Appropriate positioning of the X shaped biopsy marking clip in the
upper-outer left breast.

Final Assessment: Post Procedure Mammograms for Marker Placement

## 2019-08-10 MED ORDER — SODIUM CHLORIDE 0.9 % IV SOLN
Freq: Once | INTRAVENOUS | Status: AC
  Filled 2019-08-10: qty 250

## 2019-08-10 MED ORDER — SODIUM CHLORIDE 0.9% FLUSH
10.0000 mL | INTRAVENOUS | Status: DC | PRN
  Administered 2019-08-10: 10 mL
  Filled 2019-08-10: qty 10

## 2019-08-10 MED ORDER — HEPARIN SOD (PORK) LOCK FLUSH 100 UNIT/ML IV SOLN
500.0000 [IU] | Freq: Once | INTRAVENOUS | Status: AC | PRN
  Administered 2019-08-10: 500 [IU]
  Filled 2019-08-10: qty 5

## 2019-08-10 MED ORDER — DIPHENHYDRAMINE HCL 50 MG/ML IJ SOLN
25.0000 mg | Freq: Once | INTRAMUSCULAR | Status: AC
  Administered 2019-08-10: 25 mg via INTRAVENOUS

## 2019-08-10 MED ORDER — DIPHENHYDRAMINE HCL 50 MG/ML IJ SOLN
INTRAMUSCULAR | Status: AC
  Filled 2019-08-10: qty 1

## 2019-08-10 MED ORDER — ACETAMINOPHEN 325 MG PO TABS
ORAL_TABLET | ORAL | Status: AC
  Filled 2019-08-10: qty 2

## 2019-08-10 MED ORDER — SODIUM CHLORIDE 0.9 % IV SOLN
420.0000 mg | Freq: Once | INTRAVENOUS | Status: AC
  Administered 2019-08-10: 420 mg via INTRAVENOUS
  Filled 2019-08-10: qty 14

## 2019-08-10 MED ORDER — TRASTUZUMAB-DKST CHEMO 150 MG IV SOLR
6.0000 mg/kg | Freq: Once | INTRAVENOUS | Status: AC
  Administered 2019-08-10: 10:00:00 399 mg via INTRAVENOUS
  Filled 2019-08-10: qty 19

## 2019-08-10 MED ORDER — ACETAMINOPHEN 325 MG PO TABS
650.0000 mg | ORAL_TABLET | Freq: Once | ORAL | Status: AC
  Administered 2019-08-10: 650 mg via ORAL

## 2019-08-10 NOTE — Patient Instructions (Signed)
[facility] Discharge Instructions for Patients Receiving Chemotherapy  Today you received the following chemotherapy agents Trastuzumab (OGIVRI) & Pertuzumab (PERJETA).  To help prevent nausea and vomiting after your treatment, we encourage you to take your nausea medication as prescribed.   If you develop nausea and vomiting that is not controlled by your nausea medication, call the clinic.   BELOW ARE SYMPTOMS THAT SHOULD BE REPORTED IMMEDIATELY:  *FEVER GREATER THAN 100.5 F  *CHILLS WITH OR WITHOUT FEVER  NAUSEA AND VOMITING THAT IS NOT CONTROLLED WITH YOUR NAUSEA MEDICATION  *UNUSUAL SHORTNESS OF BREATH  *UNUSUAL BRUISING OR BLEEDING  TENDERNESS IN MOUTH AND THROAT WITH OR WITHOUT PRESENCE OF ULCERS  *URINARY PROBLEMS  *BOWEL PROBLEMS  UNUSUAL RASH Items with * indicate a potential emergency and should be followed up as soon as possible.  Feel free to call the clinic should you have any questions or concerns. The clinic phone number is [phone].  Please show the [facility] at check-in to the Emergency Department and triage nurse.

## 2019-08-10 NOTE — Patient Instructions (Signed)

## 2019-08-11 LAB — CANCER ANTIGEN 27.29: CA 27.29: 47.7 U/mL — ABNORMAL HIGH (ref 0.0–38.6)

## 2019-08-11 MED FILL — GABAPENTIN 300 MG CAPSULE: 300 | 30 days supply | Qty: 150 | Fill #3

## 2019-08-11 MED FILL — XARELTO 20 MG TABLET: 20 | 30 days supply | Qty: 30 | Fill #3

## 2019-08-11 MED FILL — SPIRONOLACTONE 25 MG TABS: 25 | 30 days supply | Qty: 30 | Fill #3

## 2019-08-11 MED FILL — CARVEDILOL 3.125 MG TABLET: 3.125 | 90 days supply | Qty: 180 | Fill #2

## 2019-08-13 ENCOUNTER — Telehealth: Payer: Self-pay | Admitting: Hematology

## 2019-08-13 NOTE — Telephone Encounter (Signed)
Scheduled appt per 1/22 los.  Spoke with pt and she is aware of the appt date and time. 

## 2019-08-15 IMAGING — US IR FLUORO GUIDE CV LINE*L*
1 series · 1 of 1 positions shown · non-contrast
Comparison: none

CLINICAL DATA: Metastatic breast cancer, access for chemotherapy

[Series 1: ir (id) (id)/(id)/(id) ir · 1 of 1 slices shown]
[im 1/1]
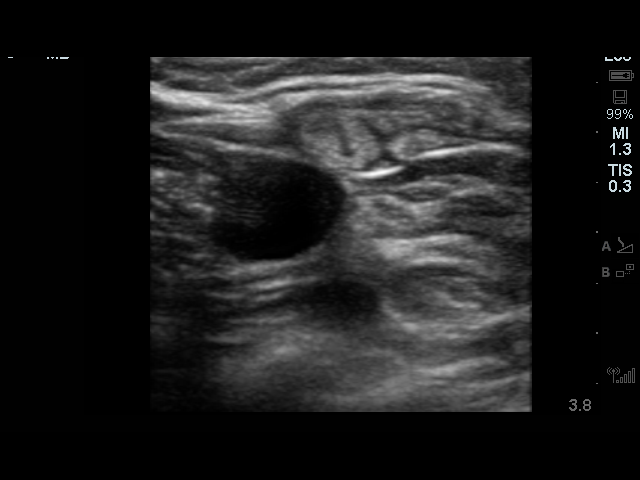

[1 of 1 positions shown; findings below may reference images not displayed]

EXAM:
LEFT INTERNAL JUGULAR SINGLE LUMEN POWER PORT CATHETER INSERTION

Radiologist:  Thiel, [place]

Guidance:  Ultrasound and fluoroscopic

MEDICATIONS:
Ancef 2 g; The antibiotic was administered within an appropriate
time interval prior to skin puncture.

ANESTHESIA/SEDATION:
Versed 2.0 mg IV; Fentanyl 100 mcg IV;

Moderate Sedation Time:  30 minutes

The patient was continuously monitored during the procedure by the
interventional radiology nurse under my direct supervision.

FLUOROSCOPY TIME:  One minutes, 18 seconds (8 mGy)

COMPLICATIONS:
None immediate.

CONTRAST:  None.

PROCEDURE:
Informed consent was obtained from the patient following explanation
of the procedure, risks, benefits and alternatives. The patient
understands, agrees and consents for the procedure. All questions
were addressed. A time out was performed.

Maximal barrier sterile technique utilized including caps, mask,
sterile gowns, sterile gloves, large sterile drape, hand hygiene,
and 2% chlorhexidine scrub.

Under sterile conditions and local anesthesia, left internal jugular
micropuncture venous access was performed. Access was performed with
ultrasound. Images were obtained for documentation of the patent
left internal jugular vein. A guide wire was inserted followed by a
transitional dilator. This allowed insertion of a guide wire and
catheter into the IVC. Measurements were obtained from the SVC / RA
junction back to the left IJ venotomy site. In the left
infraclavicular chest, a subcutaneous pocket was created over the
second anterior rib. This was done under sterile conditions and
local anesthesia. 1% lidocaine with epinephrine was utilized for
this. A 2.5 cm incision was made in the skin. Blunt dissection was
performed to create a subcutaneous pocket over the right pectoralis
major muscle. The pocket was flushed with saline vigorously. There
was adequate hemostasis. The port catheter was assembled and checked
for leakage. The port catheter was secured in the pocket with two
retention sutures. The tubing was tunneled subcutaneously to the
left venotomy site and inserted into the SVC/RA junction through a
valved peel-away sheath. Position was confirmed with fluoroscopy.
Images were obtained for documentation. The patient tolerated the
procedure well. No immediate complications. Incisions were closed in
a two layer fashion with 4 - 0 Vicryl suture. Dermabond was applied
to the skin. The port catheter was accessed, blood was aspirated
followed by saline and heparin flushes. Needle was removed. A dry
sterile dressing was applied.
IMPRESSION: Ultrasound and fluoroscopically guided left internal jugular single
lumen power port catheter insertion. Tip in the SVC/RA junction.
Catheter ready for use.

## 2019-08-16 MED FILL — LOSARTAN POTASSIUM 50 MG TA: 50 | 30 days supply | Qty: 30 | Fill #5

## 2019-08-16 MED FILL — OMEPRAZOLE 20 MG CAP: 20 | 30 days supply | Qty: 60 | Fill #2

## 2019-08-27 ENCOUNTER — Other Ambulatory Visit: Payer: Self-pay | Admitting: Hematology

## 2019-08-27 MED FILL — DIPHENOXYLATE-ATROPINE 2.5-: 2.5-0.025 | 8 days supply | Qty: 60 | Fill #1

## 2019-08-28 ENCOUNTER — Telehealth (HOSPITAL_COMMUNITY): Payer: Self-pay

## 2019-08-28 MED FILL — POTASSIUM CHLORIDE CRYS ER: 20 | 30 days supply | Qty: 30 | Fill #0

## 2019-08-28 NOTE — Telephone Encounter (Signed)

## 2019-08-29 ENCOUNTER — Encounter (HOSPITAL_COMMUNITY): Payer: Self-pay | Admitting: Internal Medicine

## 2019-08-29 ENCOUNTER — Ambulatory Visit (INDEPENDENT_AMBULATORY_CARE_PROVIDER_SITE_OTHER): Payer: Medicaid Other

## 2019-08-29 ENCOUNTER — Ambulatory Visit (INDEPENDENT_AMBULATORY_CARE_PROVIDER_SITE_OTHER): Payer: 59 | Admitting: Podiatry

## 2019-08-29 ENCOUNTER — Other Ambulatory Visit: Payer: Self-pay

## 2019-08-29 ENCOUNTER — Ambulatory Visit (HOSPITAL_COMMUNITY)
Admission: RE | Admit: 2019-08-29 | Discharge: 2019-08-29 | Disposition: A | Discharge status: Home / Self Care | Payer: 59 | Source: Ambulatory Visit | Attending: Internal Medicine | Admitting: Internal Medicine

## 2019-08-29 ENCOUNTER — Inpatient Hospital Stay: Payer: 59 | Attending: Hematology

## 2019-08-29 ENCOUNTER — Inpatient Hospital Stay: Payer: 59

## 2019-08-29 ENCOUNTER — Ambulatory Visit (HOSPITAL_BASED_OUTPATIENT_CLINIC_OR_DEPARTMENT_OTHER)
Admission: RE | Admit: 2019-08-29 | Discharge: 2019-08-29 | Disposition: A | Discharge status: Home / Self Care | Payer: 59 | Source: Ambulatory Visit | Attending: Internal Medicine | Admitting: Internal Medicine

## 2019-08-29 VITALS — BP 140/92 | HR 67 | Wt 140.2 lb

## 2019-08-29 VITALS — BP 145/88 | HR 68 | Temp 97.7°F | Resp 18

## 2019-08-29 DIAGNOSIS — Z7901 Long term (current) use of anticoagulants: Secondary | ICD-10-CM | POA: Diagnosis not present

## 2019-08-29 DIAGNOSIS — C50919 Malignant neoplasm of unspecified site of unspecified female breast: Secondary | ICD-10-CM

## 2019-08-29 DIAGNOSIS — K219 Gastro-esophageal reflux disease without esophagitis: Secondary | ICD-10-CM | POA: Diagnosis not present

## 2019-08-29 DIAGNOSIS — I82621 Acute embolism and thrombosis of deep veins of right upper extremity: Secondary | ICD-10-CM | POA: Insufficient documentation

## 2019-08-29 DIAGNOSIS — Z170 Estrogen receptor positive status [ER+]: Secondary | ICD-10-CM | POA: Diagnosis not present

## 2019-08-29 DIAGNOSIS — M7751 Other enthesopathy of right foot: Secondary | ICD-10-CM | POA: Diagnosis not present

## 2019-08-29 DIAGNOSIS — M79671 Pain in right foot: Secondary | ICD-10-CM | POA: Diagnosis not present

## 2019-08-29 DIAGNOSIS — C787 Secondary malignant neoplasm of liver and intrahepatic bile duct: Secondary | ICD-10-CM | POA: Insufficient documentation

## 2019-08-29 DIAGNOSIS — M778 Other enthesopathies, not elsewhere classified: Secondary | ICD-10-CM

## 2019-08-29 DIAGNOSIS — I10 Essential (primary) hypertension: Secondary | ICD-10-CM

## 2019-08-29 DIAGNOSIS — Z79899 Other long term (current) drug therapy: Secondary | ICD-10-CM | POA: Insufficient documentation

## 2019-08-29 DIAGNOSIS — I5032 Chronic diastolic (congestive) heart failure: Secondary | ICD-10-CM | POA: Diagnosis not present

## 2019-08-29 DIAGNOSIS — Z7189 Other specified counseling: Secondary | ICD-10-CM

## 2019-08-29 DIAGNOSIS — Z95828 Presence of other vascular implants and grafts: Secondary | ICD-10-CM

## 2019-08-29 LAB — CBC WITH DIFFERENTIAL (CANCER CENTER ONLY)
Abs Immature Granulocytes: 0.01 10*3/uL (ref 0.00–0.07)
Basophils Absolute: 0.1 10*3/uL (ref 0.0–0.1)
Basophils Relative: 1 %
Eosinophils Absolute: 0.5 10*3/uL (ref 0.0–0.5)
Eosinophils Relative: 7 %
HCT: 34.6 % — ABNORMAL LOW (ref 36.0–46.0)
Hemoglobin: 11.4 g/dL — ABNORMAL LOW (ref 12.0–15.0)
Immature Granulocytes: 0 %
Lymphocytes Relative: 33 %
Lymphs Abs: 2.4 10*3/uL (ref 0.7–4.0)
MCH: 29.1 pg (ref 26.0–34.0)
MCHC: 32.9 g/dL (ref 30.0–36.0)
MCV: 88.3 fL (ref 80.0–100.0)
Monocytes Absolute: 0.3 10*3/uL (ref 0.1–1.0)
Monocytes Relative: 4 %
Neutro Abs: 4.1 10*3/uL (ref 1.7–7.7)
Neutrophils Relative %: 55 %
Platelet Count: 212 10*3/uL (ref 150–400)
RBC: 3.92 MIL/uL (ref 3.87–5.11)
RDW: 14.6 % (ref 11.5–15.5)
WBC Count: 7.3 10*3/uL (ref 4.0–10.5)
nRBC: 0 % (ref 0.0–0.2)

## 2019-08-29 LAB — CMP (CANCER CENTER ONLY)
ALT: 16 U/L (ref 0–44)
AST: 22 U/L (ref 15–41)
Albumin: 4.3 g/dL (ref 3.5–5.0)
Alkaline Phosphatase: 160 U/L — ABNORMAL HIGH (ref 38–126)
Anion gap: 10 (ref 5–15)
BUN: 12 mg/dL (ref 8–23)
CO2: 25 mmol/L (ref 22–32)
Calcium: 9.8 mg/dL (ref 8.9–10.3)
Chloride: 105 mmol/L (ref 98–111)
Creatinine: 1.11 mg/dL — ABNORMAL HIGH (ref 0.44–1.00)
GFR, Est AFR Am: >60 mL/min (ref >60)
GFR, Estimated: 53 mL/min — ABNORMAL LOW (ref >60)
Glucose, Bld: 96 mg/dL (ref 70–99)
Potassium: 4.4 mmol/L (ref 3.5–5.1)
Sodium: 140 mmol/L (ref 135–145)
Total Bilirubin: 0.3 mg/dL (ref 0.3–1.2)
Total Protein: 8 g/dL (ref 6.5–8.1)

## 2019-08-29 MED ORDER — SODIUM CHLORIDE 0.9% FLUSH
10.0000 mL | INTRAVENOUS | Status: DC | PRN
  Administered 2019-08-29: 10 mL
  Filled 2019-08-29: qty 10

## 2019-08-29 MED ORDER — SODIUM CHLORIDE 0.9 % IV SOLN
Freq: Once | INTRAVENOUS | Status: AC
  Filled 2019-08-29: qty 250

## 2019-08-29 MED ORDER — ACETAMINOPHEN 325 MG PO TABS
ORAL_TABLET | ORAL | Status: AC
  Filled 2019-08-29: qty 2

## 2019-08-29 MED ORDER — SODIUM CHLORIDE 0.9 % IV SOLN
420.0000 mg | Freq: Once | INTRAVENOUS | Status: AC
  Administered 2019-08-29: 420 mg via INTRAVENOUS
  Filled 2019-08-29: qty 14

## 2019-08-29 MED ORDER — ACETAMINOPHEN 325 MG PO TABS
650.0000 mg | ORAL_TABLET | Freq: Once | ORAL | Status: AC
  Administered 2019-08-29: 650 mg via ORAL

## 2019-08-29 MED ORDER — TRASTUZUMAB-DKST CHEMO 150 MG IV SOLR
6.0000 mg/kg | Freq: Once | INTRAVENOUS | Status: AC
  Administered 2019-08-29: 399 mg via INTRAVENOUS
  Filled 2019-08-29: qty 19

## 2019-08-29 MED ORDER — DIPHENHYDRAMINE HCL 50 MG/ML IJ SOLN
25.0000 mg | Freq: Once | INTRAMUSCULAR | Status: AC
  Administered 2019-08-29: 25 mg via INTRAVENOUS

## 2019-08-29 MED ORDER — DIPHENHYDRAMINE HCL 50 MG/ML IJ SOLN
INTRAMUSCULAR | Status: AC
  Filled 2019-08-29: qty 1

## 2019-08-29 MED ORDER — HEPARIN SOD (PORK) LOCK FLUSH 100 UNIT/ML IV SOLN
500.0000 [IU] | Freq: Once | INTRAVENOUS | Status: AC | PRN
  Administered 2019-08-29: 500 [IU]
  Filled 2019-08-29: qty 5

## 2019-08-29 NOTE — Progress Notes (Signed)
Cardio-Oncology Clinic Note    Date:  08/29/2019   ID:  Janyth, Riera [birth year], MRN [id number]  Location: Home  Provider location: [facility] Type of Visit: New patient  PCP:  Levin Bacon, NP  Cardiologist:  No primary care provider on file.  Referring: Dr. Burr Medico   History of Present Illness:  Teresa Hays is a 64 y/o woman with GERD, HTN and metastatic breast CA referred by Dr. Burr Medico for enrollment into the Cardio-Oncology program.   SUMMARY OF ONCOLOGIC HISTORY:     Oncology History   Cancer Staging Metastatic breast cancer [facility]) Staging form: Breast, AJCC 8th Edition - Clinical stage from 03/24/2018: Stage IV (cT2, cN1, pM1, G3, ER+, PR+, HER2+) - Signed by [name], MD on 03/30/2018       Metastatic breast cancer ([place])   01/30/2018 Procedure    Colonoscopy showed small polyp in the sigmoid colon, removed, the exam of colon including the terminal ileum was otherwise negative.    01/30/2018 Procedure    EGD by Dr. Collene Mares showed small hiatal hernia, a 8 mm polypoid lesion in the cardia, biopsied.    03/09/2018 Imaging    03/09/2018 US Abdomen IMPRESSION: 1. Mass lesions throughout the liver, consistent with metastatic disease. Liver as a somewhat nodular contour suggesting underlying hepatic cirrhosis. Inhomogeneous echotexture to the liver.  2. Cholelithiasis with mild gallbladder wall thickening. A degree of cholecystitis cannot be excluded by ultrasound.  3. Portions of pancreas obscured by gas. Visualized portions of pancreas appear normal.  4. Small right kidney. Etiology uncertain. This finding potentially may be indicative of renal artery stenosis. In this regard, question whether patient is hypertensive.    03/15/2018 Imaging    CT CAP with contrast  IMPRESSION: 1. Widespread hepatic metastasis. 2. 2.6 cm lateral right breast soft tissue nodule could represent a breast primary or an incidental  benign lesion. Consider correlation with mammogram and ultrasound. 3. No definite source of primary malignancy identified within the abdomen or pelvis. There is possible rectosigmoid junction wall thickening. Consider colonoscopy with attention to this area. 4. Distal esophageal wall thickening, suggesting esophagitis.    03/24/2018 Cancer Staging    Staging form: Breast, AJCC 8th Edition - Clinical stage from 03/24/2018: Stage IV (cT2, cN1, pM1, G3, ER+, PR+, HER2+) - Signed by [name], MD on 03/30/2018    03/24/2018 Initial Biopsy    Diagnosis 1. 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 Breast, right, needle core biopsy, 9 o'clock, 7cm from nipple - INVASIVE DUCTAL CARCINOMA. -The carcinoma is somewhat morphologically dissimilar from that in part 1. It appears grade III 3. Lymph node, needle/core biopsy, right axillary - METASTATIC CARCINOMA IN 1 OF 1 LYMPH NODE (1/1).    03/24/2018 Receptors her2    Breast biopsy: 1. Estrogen Receptor: 40%, POSITIVE, STRONG-MODERATE STAINING INTENSITY Progesterone Receptor: 70%, POSITIVE, STRONG STAINING INTENSITY Proliferation Marker Ki67: 20% HER 2 equivocal by IHC 2+, POSITIVE by FISH, ratio 2.4 and copy #4.2  2. Estrogen Receptor: 60%, POSITIVE, MODERATE STAINING INTENSITY Progesterone Receptor: 40%, POSITIVE, MODERATE STAINING INTENSITY Proliferation Marker Ki67: 20% HER2 (+) by IHC 3+    03/24/2018 Initial Diagnosis    Metastatic breast cancer ([place])    03/27/2018 Pathology Results    Diagnosis Liver, needle/core biopsy, Right - METASTATIC CARCINOMA TO LIVER, CONSISTENT WITH PATIENTS CLINICAL HISTORY OF PRIMARY BREAST CARCINOMA.  ER 80%+  PR40%+ HER2- (by [facility], IHC 2+)  Ki67 50%     03/28/2018 Pathology Results    03/28/2018 Surgical Pathology Diagnosis 1. Breast, left, needle core biopsy, 9 o'clock - FIBROCYSTIC  CHANGES. - THERE IS NO EVIDENCE OF MALIGNANCY. 2. Breast, left, needle core biopsy, 2 o'clock - FIBROADENOMA. - THERE IS NO EVIDENCE OF MALIGNANCY. - SEE COMMENT.    03/29/2018 Imaging    03/29/2018 Bone Scan IMPRESSION: No scintigraphic evidence of osseous metastatic disease.    03/30/2018 Imaging    Bone scan  IMPRESSION: No scintigraphic evidence of osseous metastatic disease.     04/07/2018 -  Chemotherapy    First line chemo weekly Taxol and herceptin/Perjeta every 3 weeks starting 04/07/18. She developed infusion reaction to taxol and it was discontinued. Added Abraxane on C1D8, 2 weeks on/1 week off.  Abraxne stopped after 10/13/18 due to worsening Neuropathy    06/06/2018 Imaging    CT CAP IMPRESSION: 1. Generally improved appearance, with reduced axillary adenopathy and reduced enhancing component of the hepatic masses, with some of the hepatic mass is moderately smaller than on the prior exam. Reduced size of the right lateral breast mass compared to the prior 03/15/2018 exam. 2. New mild interstitial accentuation in the lungs, significance uncertain. Part of this appearance may be due to lower lung volumes on today's exam. 3. Mild wall thickening in the descending colon and upper rectum suggesting low-grade colitis/inflammation. Prominent stool throughout the colon favors constipation. 4. Other imaging findings of potential clinical significance: Aortic Atherosclerosis (ICD10-I70.0). Mild cardiomegaly. Mild nodularity in the right lower lobe appears stable. Contracted and thick-walled gallbladder.     09/18/2018 Imaging    CT CAP W Contrast 09/18/18  IMPRESSION: 1. Liver metastases have decreased in size. 2. Right breast mass, mild right axillary lymphadenopathy and scattered tiny right pulmonary nodules are all stable. 3. New mild left supraclavicular and left subpectoral lymphadenopathy, can not exclude progression of metastatic nodal disease. 4.  Moderate colorectal stool volume, which may indicate constipation. 5. Aortic Atherosclerosis (ICD10-I70.0).    10/2018 -  Anti-estrogen oral therapy    Letrozole 2.5 mg daily starting 10/2018     She presents today for routine f/u. She has hadfirst-line weekly TaxolwithHerceptin/Perjeta every 3 weeks starting 04/07/18. She developed infusion reaction to taxol and it was discontinued. Changed to Abraxaneweekly2 weeks on/1 week offfrom 07/21/2018.Abraxane held after October 13, 2018 due to his worsening neuropathy. Now on Letrozole 2.5 mg daily starting 10/2018. Remains on Herceptin/Perjeta.We had a telehealth visit in August and EF felt to be mildly reduced at 45-50%. Herceptin/perjeta held for one dose. Now on losartan and carvedilol.   Doing well tolerating herceptin/perjeta. Feels good. Tolerating herceptin/perjeta without difficulty. No HF symptoms. No CP/SOB. BP improved 130/80s.   Echo today EF 55-60% GLS - 15.8%   Echo 11/20 EF 55% GLS -17.9% Personally reviewed  Echo 9/20 EF 50-55% GLS - 14.4% (underestimated)  Echo 02/27/19 EF 50% GLS -21.4%   Echo 07/28/18 EF 60-65% GLS -19.2% Echo 12/1018  EF 55-60% GLS -16.1%     Past Medical History:  Diagnosis Date  . GERD (gastroesophageal reflux disease)   . Hypertension   . rt breast ca with mets to liver dx'd 03/2018   Past Surgical History:  Procedure Laterality Date  . COLONOSCOPY    . ESOPHAGOGASTRODUODENOSCOPY ENDOSCOPY    . IR IMAGING GUIDED PORT INSERTION  04/04/2018     Current Outpatient Medications  Medication Sig Dispense Refill  . amLODipine (NORVASC) 5 MG tablet  Take 5 mg by mouth daily.     . carvedilol (COREG) 3.125 MG tablet Take 1 tablet (3.125 mg total) by mouth 2 (two) times daily. 180 tablet 3  . chlorpheniramine-HYDROcodone (TUSSIONEX) 10-8 MG/5ML SUER Take 5 mLs by mouth at bedtime as needed for cough. 115 mL 0  . diphenoxylate-atropine (LOMOTIL) 2.5-0.025 MG tablet Take 1-2 tablets by mouth 4 (four)  times daily as needed for diarrhea or loose stools. 60 tablet 1  . DULoxetine (CYMBALTA) 20 MG capsule TAKE 1 CAPSULE BY MOUTH ONCE DAILY 30 capsule 1  . gabapentin (NEURONTIN) 300 MG capsule Patient is to take one 300 mg capsule in am, one 300 mg capsule midday and three 300 mg capsules at bedtime. (Patient taking differently: 300 mg 5 (five) times daily. Patient is to take one 300 mg capsule in am, one 300 mg capsule midday and three 300 mg capsules at bedtime.) 150 capsule 3  . letrozole (FEMARA) 2.5 MG tablet TAKE 1 TABLET (2.5 MG TOTAL) BY MOUTH DAILY. 90 tablet 1  . lidocaine-prilocaine (EMLA) cream Apply to affected area once 30 g 3  . LORazepam (ATIVAN) 0.5 MG tablet Take 1 tablet (0.5 mg total) by mouth at bedtime as needed for anxiety. 30 tablet 0  . losartan (COZAAR) 50 MG tablet Take 1 tablet (50 mg total) by mouth daily. 90 tablet 3  . omeprazole (PRILOSEC) 20 MG capsule Take 1 capsule (20 mg total) by mouth 2 (two) times daily before a meal. 60 capsule 2  . potassium chloride SA (KLOR-CON) 20 MEQ tablet TAKE 1 TABLET BY MOUTH ONCE DAILY 30 tablet 0  . spironolactone (ALDACTONE) 25 MG tablet Take 1 tablet (25 mg total) by mouth daily. 30 tablet 6  . traMADol (ULTRAM) 50 MG tablet Take 1 tablet (50 mg total) by mouth every 6 (six) hours as needed for moderate pain or severe pain. 30 tablet 0  . XARELTO 20 MG TABS tablet TAKE 1 TABLET (20 MG TOTAL) BY MOUTH DAILY WITH SUPPER. 30 tablet 5   No current facility-administered medications for this encounter.    Allergies:   Patient has no known allergies.   Social History:  The patient  reports that she has never smoked. She has never used smokeless tobacco. She reports that she does not drink alcohol or use drugs.   Family History:  The patient's family history includes Cancer (age of onset: 34) in her sister; Diabetes in her mother; Rheum arthritis in her sister.   ROS:  Please see the history of present illness.   All other systems are  personally reviewed and negative.     Vitals:   08/29/19 0955  BP: (!) 140/92  Pulse: 67  SpO2: 99%  Weight: 63.6 kg (140 lb 3.2 oz)     Exam:   General:  Well appearing. No resp difficulty HEENT: normal Neck: supple. JVP 7. Carotids 2+ bilat; no bruits. No lymphadenopathy or thryomegaly appreciated. Cor: PMI nondisplaced. Regular rate & rhythm. No rubs, gallops or murmurs. Lungs: clear Abdomen: soft, nontender, nondistended. No hepatosplenomegaly. No bruits or masses. Good bowel sounds. Extremities: no cyanosis, clubbing, rash, trr edema Neuro: alert & orientedx3, cranial nerves grossly intact. moves all 4 extremities w/o difficulty. Affect pleasant    Recent Labs: 05/29/2019: B Natriuretic Peptide 9.3 08/10/2019: ALT 13; BUN 18; Creatinine 1.23; Hemoglobin 10.4; Platelet Count 193; Potassium 4.0; Sodium 139  Personally reviewed   Wt Readings from Last 3 Encounters:  08/10/19 65 kg (143 lb 3.2 oz)  06/29/19 64.1 kg (141 lb 4.8 oz)  06/08/19 63.8 kg (140 lb 9.6 oz)      ASSESSMENT AND PLAN:  1. Breast cancer, metastatic - She has underwent first-line weekly TaxolwithHerceptin/Perjeta every 3 weeks starting 04/07/18. She developed infusion reaction to taxol and it was discontinued. Changed to Abraxaneweekly2 weeks on/1 week offfrom 07/21/2018.Abraxane held after October 13, 2018 due to his worsening neuropathy. Remains on Herceptin/Perjeta. Now on Letrozole 2.5 mg daily starting 10/2018 - Echo 8/20 EF down to 45-50% suspicious for mild Herceptin cardiotoxicity.  - Herceptin/perjeta held  for one dose.  - Echo 9/20 EF 50-55%  - Echo 05/29/19 EF stable at 55% GLS -17.9 % - Echo 2/21  EF 55-60% GLS -15.8 - Continue carvedilol 3.125 bid - Continue losartan '50mg'$  daily - spiro 25 - Continue herceptin/perjeta with close f/u as ideally will need life-long therapy - EF stable. Will cotninue to follow every 3 months for now, As we get farther out can continue going out to 6  months if EF remains stable  - Repeat echo 3 months   2. HTN  - BP improved - Continue losartan/carvedilol/spiro  3. RUE DVT - continue Xarelto.    Glori Bickers, MD  08/29/2019 9:56 AM  Advanced [facility] Failure [place] [address] [phone] (office) [phone] (fax)

## 2019-08-29 NOTE — Progress Notes (Signed)
  Echocardiogram 2D Echocardiogram has been performed.  Teresa Hays 08/29/2019, 9:56 AM

## 2019-08-29 NOTE — Addendum Note (Signed)
Encounter addended by: Kerry Dory, CMA on: 08/29/2019 10:12 AM  Actions taken: Order list changed, Diagnosis association updated, Clinical Note Signed

## 2019-08-29 NOTE — Patient Instructions (Signed)
It was great to see you today! No medication changes are needed at this time.  Your physician recommends that you schedule a follow-up appointment in: 3 months with Dr Haroldine Laws and echo  Your physician has requested that you have an echocardiogram. Echocardiography is a painless test that uses sound waves to create images of your heart. It provides your doctor with information about the size and shape of your heart and how well your heart's chambers and valves are working. This procedure takes approximately one hour. There are no restrictions for this procedure.   Do the following things EVERYDAY: 1) Weigh yourself in the morning before breakfast. Write it down and keep it in a log. 2) Take your medicines as prescribed 3) Eat low salt foods--Limit salt (sodium) to 2000 mg per day.  4) Stay as active as you can everyday 5) Limit all fluids for the day to less than 2 liters   At the [facility], you and your health needs are our priority. As part of our continuing mission to provide you with exceptional heart care, we have created designated Provider Care Teams. These Care Teams include your primary Cardiologist (physician) and Advanced Practice Providers (APPs- Physician Assistants and Nurse Practitioners) who all work together to provide you with the care you need, when you need it.   You may see any of the following providers on your designated Care Team at your next follow up: Marland Kitchen Dr Glori Bickers . Dr Loralie Champagne . Darrick Grinder, NP . Lyda Jester, PA . Audry Riles, PharmD   Please be sure to bring in all your medications bottles to every appointment.

## 2019-08-29 NOTE — Patient Instructions (Signed)
[facility] Discharge Instructions for Patients Receiving Chemotherapy  Today you received the following chemotherapy agents Trastuzumab (OGIVRI) & Pertuzumab (PERJETA).  To help prevent nausea and vomiting after your treatment, we encourage you to take your nausea medication as prescribed.   If you develop nausea and vomiting that is not controlled by your nausea medication, call the clinic.   BELOW ARE SYMPTOMS THAT SHOULD BE REPORTED IMMEDIATELY:  *FEVER GREATER THAN 100.5 F  *CHILLS WITH OR WITHOUT FEVER  NAUSEA AND VOMITING THAT IS NOT CONTROLLED WITH YOUR NAUSEA MEDICATION  *UNUSUAL SHORTNESS OF BREATH  *UNUSUAL BRUISING OR BLEEDING  TENDERNESS IN MOUTH AND THROAT WITH OR WITHOUT PRESENCE OF ULCERS  *URINARY PROBLEMS  *BOWEL PROBLEMS  UNUSUAL RASH Items with * indicate a potential emergency and should be followed up as soon as possible.  Feel free to call the clinic should you have any questions or concerns. The clinic phone number is [phone].  Please show the [facility] at check-in to the Emergency Department and triage nurse.

## 2019-08-30 ENCOUNTER — Other Ambulatory Visit: Payer: Self-pay | Admitting: Podiatry

## 2019-08-30 DIAGNOSIS — M778 Other enthesopathies, not elsewhere classified: Secondary | ICD-10-CM

## 2019-08-31 ENCOUNTER — Other Ambulatory Visit: Payer: 59

## 2019-08-31 ENCOUNTER — Encounter: Payer: Self-pay | Admitting: Podiatry

## 2019-08-31 ENCOUNTER — Ambulatory Visit: Payer: 59

## 2019-08-31 NOTE — Progress Notes (Signed)
Subjective:  Patient ID: Teresa Hays, female    DOB: [birth year],  MRN: [id number]  No chief complaint on file.   64 y.o. female presents with the above complaint.  Patient presents with right fourth and fifth digit capsulitis.  Patient states the pain is in the right plantar lateral forefoot.  It has been going for about 3 to 4 months.  Pain is elevated when walking.  There is throbbing sensation as well as tingling sensation.  Pain scale is 8 out of 10.  She has not been able to ambulate properly because of the pain.  She wears regular sneakers.  She denies any other acute complaints.  She would like to know if there is anything that could be done for this.  She denies seeing any other podiatrist for this.  She denies any other alleviating or treatment factors.   Review of Systems: Negative except as noted in the HPI. Denies N/V/F/Ch.  Past Medical History:  Diagnosis Date  . GERD (gastroesophageal reflux disease)   . Hypertension   . rt breast ca with mets to liver dx'd 03/2018    Current Outpatient Medications:  .  amLODipine (NORVASC) 5 MG tablet, Take 5 mg by mouth daily. , Disp: , Rfl:  .  carvedilol (COREG) 3.125 MG tablet, Take 1 tablet (3.125 mg total) by mouth 2 (two) times daily., Disp: 180 tablet, Rfl: 3 .  chlorpheniramine-HYDROcodone (TUSSIONEX) 10-8 MG/5ML SUER, Take 5 mLs by mouth at bedtime as needed for cough., Disp: 115 mL, Rfl: 0 .  diphenoxylate-atropine (LOMOTIL) 2.5-0.025 MG tablet, Take 1-2 tablets by mouth 4 (four) times daily as needed for diarrhea or loose stools., Disp: 60 tablet, Rfl: 1 .  DULoxetine (CYMBALTA) 20 MG capsule, TAKE 1 CAPSULE BY MOUTH ONCE DAILY, Disp: 30 capsule, Rfl: 1 .  gabapentin (NEURONTIN) 300 MG capsule, Patient is to take one 300 mg capsule in am, one 300 mg capsule midday and three 300 mg capsules at bedtime., Disp: , Rfl:  .  letrozole (FEMARA) 2.5 MG tablet, TAKE 1 TABLET (2.5 MG TOTAL) BY MOUTH DAILY., Disp: 90 tablet, Rfl:  1 .  lidocaine-prilocaine (EMLA) cream, Apply to affected area once, Disp: 30 g, Rfl: 3 .  LORazepam (ATIVAN) 0.5 MG tablet, Take 1 tablet (0.5 mg total) by mouth at bedtime as needed for anxiety., Disp: 30 tablet, Rfl: 0 .  losartan (COZAAR) 50 MG tablet, Take 1 tablet (50 mg total) by mouth daily., Disp: 90 tablet, Rfl: 3 .  omeprazole (PRILOSEC) 20 MG capsule, Take 1 capsule (20 mg total) by mouth 2 (two) times daily before a meal., Disp: 60 capsule, Rfl: 2 .  potassium chloride SA (KLOR-CON) 20 MEQ tablet, TAKE 1 TABLET BY MOUTH ONCE DAILY, Disp: 30 tablet, Rfl: 0 .  spironolactone (ALDACTONE) 25 MG tablet, Take 1 tablet (25 mg total) by mouth daily., Disp: 30 tablet, Rfl: 6 .  traMADol (ULTRAM) 50 MG tablet, Take 1 tablet (50 mg total) by mouth every 6 (six) hours as needed for moderate pain or severe pain., Disp: 30 tablet, Rfl: 0 .  XARELTO 20 MG TABS tablet, TAKE 1 TABLET (20 MG TOTAL) BY MOUTH DAILY WITH SUPPER., Disp: 30 tablet, Rfl: 5  Social History   Tobacco Use  Smoking Status Never Smoker  Smokeless Tobacco Never Used    No Known Allergies Objective:  There were no vitals filed for this visit. There is no height or weight on file to calculate BMI. Constitutional Well developed. Well nourished.  Vascular Dorsalis pedis pulses palpable bilaterally. Posterior tibial pulses palpable bilaterally. Capillary refill normal to all digits.  No cyanosis or clubbing noted. Pedal hair growth normal.  Neurologic Normal speech. Oriented to person, place, and time. Epicritic sensation to light touch grossly present bilaterally.  Dermatologic Nails well groomed and normal in appearance. No open wounds. No skin lesions.  Orthopedic:  Pain on palpation to the right fifth metatarsal phalangeal joint and right fourth metatarsophalangeal joint.  Mild pain with range of motion of the joint.  Negative Mulder's click.  However unable to differentiate between the interspace as well as the  capsulitis from the joint as it is very narrow.   Radiographs: 2 views of skeletally mature adult foot: 2 views of skeletally mature adult foot.  There is decrease in first metatarsophalangeal joint space even in nature consistent with mild arthritic changes.  No other bony abnormalities noted.  There is adductovarus component of the fifth digit.  No midfoot arthritis.  Mild posterior and plantar heel spur noted.  Hammertoe contracture of the second digit noted. Assessment:   1. Foot pain, right   2. Capsulitis of foot, right   3. Capsulitis of toe of right foot    Plan:  Patient was evaluated and treated and all questions answered.  Right fourth and fifth digit metatarsophalangeal joint capsulitis -I explained to the patient the etiology and various treatment options associated with the right fourth and fifth digit capsulitis.  I believe this has to do with her shoes that are very narrow the front and not supportive.  I discussed in extensive length about shoe gear modification.  Patient states understanding will attempt to get new shoes.  I also believe patient will benefit from a steroid injection into the fourth and fifth metatarsophalangeal joint to decrease the capsulitis.  However it is difficult to rule out neuroma at this time.  If her pain is not resolved completely with the steroid injection I will attempt to do a nerve block and ruling neuroma at that time. -A steroid injection was performed at right fourth metatarsophalangeal joint and right fifth metatarsophalangeal joint using 1% plain Lidocaine and 10 mg of Kenalog. This was well tolerated. -Surgical shoe was dispensed to offload the right forefoot.   No follow-ups on file.

## 2019-09-03 ENCOUNTER — Other Ambulatory Visit: Payer: Self-pay | Admitting: Hematology

## 2019-09-03 MED FILL — DULOXETINE HCL 20 MG CPEP: 20 | 30 days supply | Qty: 30 | Fill #0

## 2019-09-09 MED FILL — XARELTO 20 MG TABLET: 20 | 30 days supply | Qty: 30 | Fill #4

## 2019-09-09 MED FILL — SPIRONOLACTONE 25 MG TABS: 25 | 30 days supply | Qty: 30 | Fill #4

## 2019-09-12 ENCOUNTER — Encounter: Payer: Self-pay | Admitting: Hematology

## 2019-09-13 ENCOUNTER — Other Ambulatory Visit: Payer: Self-pay | Admitting: Hematology

## 2019-09-13 ENCOUNTER — Encounter: Payer: Self-pay | Admitting: Hematology

## 2019-09-14 ENCOUNTER — Other Ambulatory Visit: Payer: Self-pay | Admitting: Hematology

## 2019-09-14 MED FILL — LOSARTAN POTASSIUM 50 MG TA: 50 | 30 days supply | Qty: 30 | Fill #6

## 2019-09-14 NOTE — Progress Notes (Signed)
Please observe patient 30 minutes after first [place] dose.   Demetrius Charity, PharmD, [facility] Phone: [phone] 09/14/2019

## 2019-09-14 NOTE — Progress Notes (Signed)
Teresa Hays   [phone] [phone]   Clinic Follow up Note   Patient Care Team: Levin Bacon, NP as PCP - General (Family Medicine) [name], MD as Consulting Physician (Gastroenterology) [name], MD as Consulting Physician (Hematology)  Date of Service:  09/21/2019  CHIEF COMPLAINT: F/u for metastatic breast cancer  SUMMARY OF ONCOLOGIC HISTORY: Oncology History Overview Note  Cancer Staging Metastatic breast cancer [facility]) Staging form: Breast, AJCC 8th Edition - Clinical stage from 03/24/2018: Stage IV (cT2, cN1, pM1, G3, ER+, PR+, HER2+) - Signed by [name], MD on 03/30/2018     Metastatic breast cancer ([place])  01/30/2018 Procedure   Colonoscopy showed small polyp in the sigmoid colon, removed, the exam of colon including the terminal ileum was otherwise negative.   01/30/2018 Procedure   EGD by Dr. Collene Mares showed small hiatal hernia, a 8 mm polypoid lesion in the cardia, biopsied.   03/09/2018 Imaging   03/09/2018 US Abdomen IMPRESSION: 1. Mass lesions throughout the liver, consistent with metastatic disease. Liver as a somewhat nodular contour suggesting underlying hepatic cirrhosis. Inhomogeneous echotexture to the liver.  2. Cholelithiasis with mild gallbladder wall thickening. A degree of cholecystitis cannot be excluded by ultrasound.  3. Portions of pancreas obscured by gas. Visualized portions of pancreas appear normal.  4. Small right kidney. Etiology uncertain. This finding potentially may be indicative of renal artery stenosis. In this regard, question whether patient is hypertensive.   03/15/2018 Imaging   CT CAP with contrast  IMPRESSION: 1. Widespread hepatic metastasis. 2. 2.6 cm lateral right breast soft tissue nodule could represent a breast primary or an incidental benign lesion. Consider correlation with mammogram and ultrasound. 3. No definite source of primary malignancy identified within the abdomen or pelvis.  There is possible rectosigmoid junction wall thickening. Consider colonoscopy with attention to this area. 4. Distal esophageal wall thickening, suggesting esophagitis.   03/24/2018 Cancer Staging   Staging form: Breast, AJCC 8th Edition - Clinical stage from 03/24/2018: Stage IV (cT2, cN1, pM1, G3, ER+, PR+, HER2+) - Signed by [name], MD on 03/30/2018   03/24/2018 Initial Biopsy   Diagnosis 1. 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 Breast, right, needle core biopsy, 9 o'clock, 7cm from nipple - INVASIVE DUCTAL CARCINOMA. -The carcinoma is somewhat morphologically dissimilar from that in part 1. It appears grade III 3. Lymph node, needle/core biopsy, right axillary - METASTATIC CARCINOMA IN 1 OF 1 LYMPH NODE (1/1).   03/24/2018 Receptors her2   Breast biopsy: 1. Estrogen Receptor: 40%, POSITIVE, STRONG-MODERATE STAINING INTENSITY Progesterone Receptor: 70%, POSITIVE, STRONG STAINING INTENSITY Proliferation Marker Ki67: 20% HER 2 equivocal by IHC 2+, POSITIVE by FISH, ratio 2.4 and copy #4.2  2. Estrogen Receptor: 60%, POSITIVE, MODERATE STAINING INTENSITY Progesterone Receptor: 40%, POSITIVE, MODERATE STAINING INTENSITY Proliferation Marker Ki67: 20% HER2 (+) by IHC 3+   03/24/2018 Initial Diagnosis   Metastatic breast cancer ([place])   03/27/2018 Pathology Results   Diagnosis Liver, needle/core biopsy, Right - METASTATIC CARCINOMA TO LIVER, CONSISTENT WITH PATIENTS CLINICAL HISTORY OF PRIMARY BREAST CARCINOMA.  ER 80%+ PR40%+ HER2- (by [facility], IHC 2+)  Ki67 50%    03/28/2018 Pathology Results   03/28/2018 Surgical Pathology Diagnosis 1. Breast, left, needle core biopsy, 9 o'clock - FIBROCYSTIC CHANGES. - THERE IS NO EVIDENCE OF MALIGNANCY. 2. Breast, left, needle core biopsy, 2 o'clock - FIBROADENOMA. - THERE IS NO EVIDENCE OF  MALIGNANCY. - SEE COMMENT.   03/29/2018 Imaging    03/29/2018 Bone Scan IMPRESSION: No scintigraphic evidence of osseous metastatic disease.   03/30/2018 Imaging   Bone scan  IMPRESSION: No scintigraphic evidence of osseous metastatic disease.    04/07/2018 -  Chemotherapy   First line chemo weekly Taxol and herceptin/Perjeta every 3 weeks starting 04/07/18. She developed infusion reaction to taxol and it was discontinued. Added Abraxane on C1D8, 2 weeks on/1 week off.  Abraxne stopped after 10/13/18 due to worsening Neuropathy. She has continued with maintenance Herceptin injection/Perjeta q3weeks. Switched to Herceptin injections on 09/21/19.    06/06/2018 Imaging   CT CAP IMPRESSION: 1. Generally improved appearance, with reduced axillary adenopathy and reduced enhancing component of the hepatic masses, with some of the hepatic mass is moderately smaller than on the prior exam. Reduced size of the right lateral breast mass compared to the prior 03/15/2018 exam. 2. New mild interstitial accentuation in the lungs, significance uncertain. Part of this appearance may be due to lower lung volumes on today's exam. 3. Mild wall thickening in the descending colon and upper rectum suggesting low-grade colitis/inflammation. Prominent stool throughout the colon favors constipation. 4. Other imaging findings of potential clinical significance: Aortic Atherosclerosis (ICD10-I70.0). Mild cardiomegaly. Mild nodularity in the right lower lobe appears stable. Contracted and thick-walled gallbladder.    09/18/2018 Imaging   CT CAP W Contrast 09/18/18  IMPRESSION: 1. Liver metastases have decreased in size. 2. Right breast mass, mild right axillary lymphadenopathy and scattered tiny right pulmonary nodules are all stable. 3. New mild left supraclavicular and left subpectoral lymphadenopathy, can not exclude progression of metastatic nodal disease. 4. Moderate colorectal stool volume, which may indicate constipation. 5.  Aortic Atherosclerosis (ICD10-I70.0).    10/2018 -  Anti-estrogen oral therapy   Letrozole 2.5 mg daily starting 10/2018   01/10/2019 Imaging   CT CAP W Contrast 01/10/19  IMPRESSION: 1. Continued improvement in the hepatic metastatic lesions which have reduced in size. 2. Stable mild left supraclavicular and subpectoral adenopathy. 3. Essentially stable small right lower lobe pulmonary nodule and separate small subpleural nodule along the right hemidiaphragm. Surveillance suggested. 4. Other imaging findings of potential clinical significance: Mild cardiomegaly. Mild circumferential distal esophageal wall thickening, the most common cause would be esophagitis. Airway thickening is present, suggesting bronchitis or reactive airways disease. Airway plugging in the lower lobes and in the right middle lobe. Stable small right adrenal adenoma.   05/15/2019 Imaging   CT CAP W Contrast 05/15/19  IMPRESSION: CT CHEST IMPRESSION   1. Similar appearance of borderline supraclavicular, axillary, and subpectoral adenopathy. 2. Improved and resolved right lower lobe pulmonary nodularity. 3. Esophageal air fluid level suggests dysmotility or gastroesophageal reflux.   CT ABDOMEN AND PELVIS IMPRESSION   1. Improved hepatic metastasis. 2. Small bowel mesenteric lymph nodes which are upper normal and mildly enlarged. Likely increased and similar as detailed above. Indeterminate. Recommend attention on follow-up. 3. Cholelithiasis. 4. Motion degradation throughout the lower chest and abdomen.   09/19/2019 Imaging   CT CAP W contrast  IMPRESSION: 1. Interval decrease in size of right axillary and subpectoral lymph nodes. There are no pathologically enlarged lymph nodes remaining in the chest, abdomen, or pelvis. No new lymphadenopathy. 2. No significant change in post treatment appearance of multiple low-attenuation liver lesions, in keeping with treated metastases. 3. No evidence of new metastatic disease in the chest, abdomen,  or pelvis. 4. Aortic Atherosclerosis (ICD10-I70.0).      CURRENT THERAPY:  -First line  weekly TaxolwithHerceptin/Perjeta every 3 weeks starting 04/07/18. She developed infusion reaction to taxol and it was discontinued. Changed to Abraxaneweekly2 weeks on/1 week offfrom 07/21/2018.Abraxane held after October 13, 2018 due to his worsening neuropathy.She will continue Herceptin injection/Perjeta q3weeksmaintenance therapy.Herceptin switched to injection on 09/21/19.  -Letrozole 2.5 mg daily starting 10/2018  INTERVAL HISTORY:  Teresa Hays is here for a follow up and treatment. She presents to the clinic with her daughter. She notes her right foot neuropathy is still present. She notes she was given steroid shots which improved her pain. She was also given a foot boot. She takes five 381m Gabapentin a day. She notes Cymbalta has helped with swelling from neuropathy. She takes 1 tramadol at night when the pain is worse. She denies any other pain. She feels she is able to walk and use hands adequately. She notes her energy level is adequate.     REVIEW OF SYSTEMS:   Constitutional: Denies fevers, chills or abnormal weight loss Eyes: Denies blurriness of vision Ears, nose, mouth, throat, and face: Denies mucositis or sore throat Respiratory: Denies cough, dyspnea or wheezes Cardiovascular: Denies palpitation, chest discomfort or lower extremity swelling Gastrointestinal:  Denies nausea, heartburn or change in bowel habits Skin: Denies abnormal skin rashes Lymphatics: Denies new lymphadenopathy or easy bruising Neurological: (+) Neuropathy significantly in right foot and fingertips.  Behavioral/Psych: Mood is stable, no new changes  All other systems were reviewed with the patient and are negative.  MEDICAL HISTORY:  Past Medical History:  Diagnosis Date  . GERD (gastroesophageal reflux disease)   . Hypertension   . rt breast ca with mets to liver dx'd 03/2018    SURGICAL  HISTORY: Past Surgical History:  Procedure Laterality Date  . COLONOSCOPY    . ESOPHAGOGASTRODUODENOSCOPY ENDOSCOPY    . IR IMAGING GUIDED PORT INSERTION  04/04/2018    I have reviewed the social history and family history with the patient and they are unchanged from previous note.  ALLERGIES:  has No Known Allergies.  MEDICATIONS:  Current Outpatient Medications  Medication Sig Dispense Refill  . amLODipine (NORVASC) 5 MG tablet Take 5 mg by mouth daily.     . carvedilol (COREG) 3.125 MG tablet Take 1 tablet (3.125 mg total) by mouth 2 (two) times daily. 180 tablet 3  . chlorpheniramine-HYDROcodone (TUSSIONEX) 10-8 MG/5ML SUER Take 5 mLs by mouth at bedtime as needed for cough. 115 mL 0  . diphenoxylate-atropine (LOMOTIL) 2.5-0.025 MG tablet Take 1-2 tablets by mouth 4 (four) times daily as needed for diarrhea or loose stools. 60 tablet 1  . DULoxetine (CYMBALTA) 20 MG capsule TAKE 1 CAPSULE BY MOUTH ONCE DAILY 30 capsule 1  . gabapentin (NEURONTIN) 300 MG capsule TAKE 1 CAPSULE BY MOUTH IN THE MORNING, 1 CAPSULE MIDDAY AND 3 CAPSULES AT BEDTIME 150 capsule 3  . letrozole (FEMARA) 2.5 MG tablet TAKE 1 TABLET (2.5 MG TOTAL) BY MOUTH DAILY. 90 tablet 1  . lidocaine-prilocaine (EMLA) cream Apply to affected area once 30 g 3  . LORazepam (ATIVAN) 0.5 MG tablet Take 1 tablet (0.5 mg total) by mouth at bedtime as needed for anxiety. 30 tablet 0  . losartan (COZAAR) 50 MG tablet Take 1 tablet (50 mg total) by mouth daily. 90 tablet 3  . omeprazole (PRILOSEC) 20 MG capsule Take 1 capsule (20 mg total) by mouth 2 (two) times daily before a meal. 60 capsule 2  . potassium chloride SA (KLOR-CON) 20 MEQ tablet TAKE 1 TABLET BY MOUTH ONCE  DAILY 30 tablet 0  . spironolactone (ALDACTONE) 25 MG tablet Take 1 tablet (25 mg total) by mouth daily. 30 tablet 6  . traMADol (ULTRAM) 50 MG tablet Take 1 tablet (50 mg total) by mouth every 6 (six) hours as needed for moderate pain or severe pain. 30 tablet 0  .  XARELTO 20 MG TABS tablet TAKE 1 TABLET (20 MG TOTAL) BY MOUTH DAILY WITH SUPPER. 30 tablet 5   No current facility-administered medications for this visit.    PHYSICAL EXAMINATION: ECOG PERFORMANCE STATUS: 1 - Symptomatic but completely ambulatory  Vitals:   09/21/19 0828 09/21/19 0830  BP: (!) 154/84 (!) 180/96  Pulse: 78   Resp: 18   Temp: 98.5 F (36.9 C)   SpO2: 100%    Filed Weights   09/21/19 0828  Weight: 141 lb 14.4 oz (64.4 kg)    GENERAL:alert, no distress and comfortable SKIN: skin color, texture, turgor are normal, no rashes or significant lesions EYES: normal, Conjunctiva are pink and non-injected, sclera clear  NECK: supple, thyroid normal size, non-tender, without nodularity LYMPH:  no palpable lymphadenopathy in the cervical, axillary  LUNGS: clear to auscultation and percussion with normal breathing effort HEART: regular rate & rhythm and no murmurs and no lower extremity edema ABDOMEN:abdomen soft, non-tender and normal bowel sounds Musculoskeletal:no cyanosis of digits and no clubbing  NEURO: alert & oriented x 3 with fluent speech, no focal motor/sensory deficits BREAST: No palpable mass, nodules or adenopathy bilaterally.   LABORATORY DATA:  I have reviewed the data as listed CBC Latest Ref Rng & Units 09/19/2019 08/29/2019 08/10/2019  WBC 4.0 - 10.5 K/uL 6.8 7.3 6.4  Hemoglobin 12.0 - 15.0 g/dL 11.1(L) 11.4(L) 10.4(L)  Hematocrit 36.0 - 46.0 % 34.3(L) 34.6(L) 32.1(L)  Platelets 150 - 400 K/uL 192 212 193     CMP Latest Ref Rng & Units 09/19/2019 08/29/2019 08/10/2019  Glucose 70 - 99 mg/dL 91 96 112(H)  BUN 8 - 23 mg/dL _0 Creatinine 0.44 - 1.00 mg/dL 1.27(H) 1.11(H) 1.23(H)  Sodium 135 - 145 mmol/L 140 140 139  Potassium 3.5 - 5.1 mmol/L 4.3 4.4 4.0  Chloride 98 - 111 mmol/L 104 105 105  CO2 22 - 32 mmol/L _1 Calcium 8.9 - 10.3 mg/dL 9.8 9.8 9.0  Total Protein 6.5 - 8.1 g/dL 7.8 8.0 7.1  Total Bilirubin 0.3 - 1.2 mg/dL 0.4 0.3 0.3   Alkaline Phos 38 - 126 U/L 139(H) 160(H) 128(H)  AST 15 - 41 U/L _2 ALT 0 - 44 U/L _3 RADIOGRAPHIC STUDIES: I have personally reviewed the radiological images as listed and agreed with the findings in the report. No results found.   ASSESSMENT & PLAN:  VIANNE GRIESHOP is a 64 y.o. female with    1. 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 Liver met was HER2-.  -Given the metastatic disease, her cancer is not curable but still treatable. Shehas been treated withLetrozole daily andFirst-line Herceptin/Perjetaevery 3 weeks and weekly Taxol. Due to infusionreaction, taxol was switched to Abraxane.We stoppedAbraxaneafter cycle 10 due to worsening neuropathyand continued with Herceptin/Perjetaas maintenance therapy.  -I personally reviewed and discussed her CT CAP from 09/19/19 with pt and  her family which showed interval decrease in size of right axillary and subpectoral LNs and stable posttreatment changes in liver. No evidence of new lymphadenopathy or new metastatic disease. She is overall responding to treatment.  -I discussed given good response there is a chance she can proceed with right Lumpectomy and axillary LN dissection to reduce her tumor burden. She is open to this. If next PET scan in 4 months looks good will refer her to surgeon.  -Will continue current treatment as it is controlling her disease. We discussed that she still has several treatment options if she progresses on this regimen.  -She is clinically stable with persistent neuropathy. I cannot feel her Right LNs or breast mass on exam today. Labs reviewed, with mild anemia, Cr 1.27, alk Phos 139. Overall adequate  to proceed with maintenance Herceptin/Perjeta today. She is approved to try Herceptin injection today, she is agreeable. Will continue if tolerable.  -Continue Letrozole  -F/u in 6 weeks    2.Peripheral neuropathy, grade 2, Right Foot pain  -Secondarytochemotherapy,mainly in her feet -Ipreviouslystropped Abraxane on 10/13/18. -She is currently on Oral B12,Gabapentin361m 5 tabs daily and Tramadol 1 tab every 1-2 nights and low dose Cymbalta. -She has tried PT twice and does not feel it helps as much as she does not feel like a fall risk. -Her main pain is in right foot and then tingling in her fingertips. She has received steroid injection to right foot from podiatrist which has improved her pain. She overall still has adequate functions and ambulation.  -She will also continue OTC Calcium and Vit D.   4.Left UEDVT, Right LE edema -she developed LUE DVT on 08/04/2018, edema has resolved after anticoagulation -Continue onXarelto 275mindefinitely, unless she has significant bleeding or other side effects. -Shepreviouslydeveloped LE edema, right significantly more than left.10/13/18 doppler was negative for DVT.  -Ipreviouslyput her on 1 week dose of Lasix and potassium -Right LE edema resolved.   5. Anemia -She developed worseningwhen she was on Chemo, now she is off. -Continue monitoring, will consider blood transfusion if hemoglobin less than 8. -Mild and stable.   6Social support  -She lives alone, [place]  -On Ativan PRN due to anxietyabout diagnosis and treatment  7. Goal of care discussion  -she is full code -She understands the goal of care is palliative, to prolong her life, and improve her quality of life.  8. Recurrentdyspnea and wheezing  -She has been seen by Dr. IcValeta Harmsho notes this is related to her post nasal drip and her Acid reflux can contribute.  -Her Prilosec  was increased to twice daily. She will continue to f/u with Dr IcValeta Harms -much improved lately   9. Elevated Cr  -Has been fluctuating lately.  -Cr at 1.27 today (09/21/19). I recommend she increase water intake.    Plan: -Labs reviewed and adequate to proceed with Herceptin injection and Perjeta today -continue letrozole -lab, flush, and Herceptin/Perjeta in 3 weeks  -F/u in 6 weeks    No problem-specific Assessment & Plan notes found for this encounter.   No orders of the defined types were placed in this encounter.  All questions were answered. The patient knows to call the clinic with any problems, questions or concerns. No barriers to learning was detected. The total time spent in the appointment was 30 minutes.     YaTruitt MerleMD 09/21/2019   I, AmJoslyn Devonam  acting as scribe for [name], MD.   I have reviewed the above documentation for accuracy and completeness, and I agree with the above.

## 2019-09-17 ENCOUNTER — Other Ambulatory Visit: Payer: Self-pay | Admitting: Hematology

## 2019-09-17 MED FILL — GABAPENTIN 300 MG CAPSULE: 300 | 30 days supply | Qty: 150 | Fill #0

## 2019-09-17 NOTE — Telephone Encounter (Signed)
Medication refill

## 2019-09-18 ENCOUNTER — Other Ambulatory Visit: Payer: Self-pay

## 2019-09-18 DIAGNOSIS — C50919 Malignant neoplasm of unspecified site of unspecified female breast: Secondary | ICD-10-CM

## 2019-09-19 ENCOUNTER — Inpatient Hospital Stay: Payer: Medicaid Other | Attending: Hematology

## 2019-09-19 ENCOUNTER — Ambulatory Visit (HOSPITAL_COMMUNITY)
Admission: RE | Admit: 2019-09-19 | Discharge: 2019-09-19 | Disposition: A | Discharge status: Home / Self Care | Payer: Medicaid Other | Source: Ambulatory Visit | Attending: Hematology | Admitting: Hematology

## 2019-09-19 ENCOUNTER — Inpatient Hospital Stay: Payer: Medicaid Other

## 2019-09-19 ENCOUNTER — Other Ambulatory Visit: Payer: Self-pay

## 2019-09-19 DIAGNOSIS — Z5112 Encounter for antineoplastic immunotherapy: Secondary | ICD-10-CM | POA: Insufficient documentation

## 2019-09-19 DIAGNOSIS — R202 Paresthesia of skin: Secondary | ICD-10-CM | POA: Insufficient documentation

## 2019-09-19 DIAGNOSIS — R0600 Dyspnea, unspecified: Secondary | ICD-10-CM | POA: Insufficient documentation

## 2019-09-19 DIAGNOSIS — Z79811 Long term (current) use of aromatase inhibitors: Secondary | ICD-10-CM | POA: Insufficient documentation

## 2019-09-19 DIAGNOSIS — R918 Other nonspecific abnormal finding of lung field: Secondary | ICD-10-CM | POA: Diagnosis not present

## 2019-09-19 DIAGNOSIS — K2100 Gastro-esophageal reflux disease with esophagitis, without bleeding: Secondary | ICD-10-CM | POA: Diagnosis not present

## 2019-09-19 DIAGNOSIS — C50811 Malignant neoplasm of overlapping sites of right female breast: Secondary | ICD-10-CM | POA: Diagnosis present

## 2019-09-19 DIAGNOSIS — C50919 Malignant neoplasm of unspecified site of unspecified female breast: Secondary | ICD-10-CM

## 2019-09-19 DIAGNOSIS — G620 Drug-induced polyneuropathy: Secondary | ICD-10-CM | POA: Insufficient documentation

## 2019-09-19 DIAGNOSIS — R590 Localized enlarged lymph nodes: Secondary | ICD-10-CM | POA: Insufficient documentation

## 2019-09-19 DIAGNOSIS — I700 Atherosclerosis of aorta: Secondary | ICD-10-CM | POA: Diagnosis not present

## 2019-09-19 DIAGNOSIS — T451X5A Adverse effect of antineoplastic and immunosuppressive drugs, initial encounter: Secondary | ICD-10-CM | POA: Insufficient documentation

## 2019-09-19 DIAGNOSIS — D3501 Benign neoplasm of right adrenal gland: Secondary | ICD-10-CM | POA: Diagnosis not present

## 2019-09-19 DIAGNOSIS — Z79899 Other long term (current) drug therapy: Secondary | ICD-10-CM | POA: Diagnosis not present

## 2019-09-19 DIAGNOSIS — Z86718 Personal history of other venous thrombosis and embolism: Secondary | ICD-10-CM | POA: Diagnosis not present

## 2019-09-19 DIAGNOSIS — R160 Hepatomegaly, not elsewhere classified: Secondary | ICD-10-CM | POA: Diagnosis not present

## 2019-09-19 DIAGNOSIS — N270 Small kidney, unilateral: Secondary | ICD-10-CM | POA: Insufficient documentation

## 2019-09-19 DIAGNOSIS — Z170 Estrogen receptor positive status [ER+]: Secondary | ICD-10-CM | POA: Insufficient documentation

## 2019-09-19 DIAGNOSIS — R062 Wheezing: Secondary | ICD-10-CM | POA: Insufficient documentation

## 2019-09-19 DIAGNOSIS — R0982 Postnasal drip: Secondary | ICD-10-CM | POA: Diagnosis not present

## 2019-09-19 DIAGNOSIS — Z7901 Long term (current) use of anticoagulants: Secondary | ICD-10-CM | POA: Insufficient documentation

## 2019-09-19 DIAGNOSIS — C787 Secondary malignant neoplasm of liver and intrahepatic bile duct: Secondary | ICD-10-CM | POA: Diagnosis not present

## 2019-09-19 DIAGNOSIS — D649 Anemia, unspecified: Secondary | ICD-10-CM | POA: Diagnosis not present

## 2019-09-19 DIAGNOSIS — K5900 Constipation, unspecified: Secondary | ICD-10-CM | POA: Insufficient documentation

## 2019-09-19 LAB — CBC WITH DIFFERENTIAL (CANCER CENTER ONLY)
Abs Immature Granulocytes: 0.01 10*3/uL (ref 0.00–0.07)
Basophils Absolute: 0.1 10*3/uL (ref 0.0–0.1)
Basophils Relative: 1 %
Eosinophils Absolute: 0.5 10*3/uL (ref 0.0–0.5)
Eosinophils Relative: 7 %
HCT: 34.3 % — ABNORMAL LOW (ref 36.0–46.0)
Hemoglobin: 11.1 g/dL — ABNORMAL LOW (ref 12.0–15.0)
Immature Granulocytes: 0 %
Lymphocytes Relative: 33 %
Lymphs Abs: 2.3 10*3/uL (ref 0.7–4.0)
MCH: 28.2 pg (ref 26.0–34.0)
MCHC: 32.4 g/dL (ref 30.0–36.0)
MCV: 87.3 fL (ref 80.0–100.0)
Monocytes Absolute: 0.3 10*3/uL (ref 0.1–1.0)
Monocytes Relative: 4 %
Neutro Abs: 3.8 10*3/uL (ref 1.7–7.7)
Neutrophils Relative %: 55 %
Platelet Count: 192 10*3/uL (ref 150–400)
RBC: 3.93 MIL/uL (ref 3.87–5.11)
RDW: 14.1 % (ref 11.5–15.5)
WBC Count: 6.8 10*3/uL (ref 4.0–10.5)
nRBC: 0 % (ref 0.0–0.2)

## 2019-09-19 LAB — CMP (CANCER CENTER ONLY)
ALT: 17 U/L (ref 0–44)
AST: 23 U/L (ref 15–41)
Albumin: 4.1 g/dL (ref 3.5–5.0)
Alkaline Phosphatase: 139 U/L — ABNORMAL HIGH (ref 38–126)
Anion gap: 9 (ref 5–15)
BUN: 21 mg/dL (ref 8–23)
CO2: 27 mmol/L (ref 22–32)
Calcium: 9.8 mg/dL (ref 8.9–10.3)
Chloride: 104 mmol/L (ref 98–111)
Creatinine: 1.27 mg/dL — ABNORMAL HIGH (ref 0.44–1.00)
GFR, Est AFR Am: 52 mL/min — ABNORMAL LOW (ref >60)
GFR, Estimated: 45 mL/min — ABNORMAL LOW (ref >60)
Glucose, Bld: 91 mg/dL (ref 70–99)
Potassium: 4.3 mmol/L (ref 3.5–5.1)
Sodium: 140 mmol/L (ref 135–145)
Total Bilirubin: 0.4 mg/dL (ref 0.3–1.2)
Total Protein: 7.8 g/dL (ref 6.5–8.1)

## 2019-09-19 MED ORDER — HEPARIN SOD (PORK) LOCK FLUSH 100 UNIT/ML IV SOLN
INTRAVENOUS | Status: AC
  Filled 2019-09-19: qty 5

## 2019-09-19 MED ORDER — SODIUM CHLORIDE (PF) 0.9 % IJ SOLN
INTRAMUSCULAR | Status: AC
  Filled 2019-09-19: qty 50

## 2019-09-19 MED ORDER — IOHEXOL 300 MG/ML  SOLN
100.0000 mL | Freq: Once | INTRAMUSCULAR | Status: AC | PRN
  Administered 2019-09-19: 80 mL via INTRAVENOUS

## 2019-09-19 MED ORDER — HEPARIN SOD (PORK) LOCK FLUSH 100 UNIT/ML IV SOLN: 500.0000 [IU] | Freq: Once | INTRAVENOUS | Status: DC

## 2019-09-20 LAB — CANCER ANTIGEN 27.29: CA 27.29: 46.5 U/mL — ABNORMAL HIGH (ref 0.0–38.6)

## 2019-09-20 MED FILL — OMEPRAZOLE 20 MG CAP: 20 | 30 days supply | Qty: 60 | Fill #0

## 2019-09-21 ENCOUNTER — Inpatient Hospital Stay: Payer: Medicaid Other

## 2019-09-21 ENCOUNTER — Inpatient Hospital Stay (HOSPITAL_BASED_OUTPATIENT_CLINIC_OR_DEPARTMENT_OTHER): Payer: Medicaid Other | Admitting: Hematology

## 2019-09-21 ENCOUNTER — Other Ambulatory Visit: Payer: Self-pay

## 2019-09-21 ENCOUNTER — Encounter: Payer: Self-pay | Admitting: Hematology

## 2019-09-21 VITALS — BP 180/96 | HR 78 | Temp 98.5°F | Resp 18 | Ht 64.0 in | Wt 141.9 lb

## 2019-09-21 VITALS — BP 155/88 | HR 61 | Temp 97.7°F | Resp 16

## 2019-09-21 DIAGNOSIS — Z7189 Other specified counseling: Secondary | ICD-10-CM

## 2019-09-21 DIAGNOSIS — C50919 Malignant neoplasm of unspecified site of unspecified female breast: Secondary | ICD-10-CM

## 2019-09-21 DIAGNOSIS — Z5112 Encounter for antineoplastic immunotherapy: Secondary | ICD-10-CM | POA: Diagnosis not present

## 2019-09-21 MED ORDER — DIPHENHYDRAMINE HCL 25 MG PO CAPS
ORAL_CAPSULE | ORAL | Status: AC
  Filled 2019-09-21: qty 1

## 2019-09-21 MED ORDER — PERTUZ-TRASTUZ-HYALURON-ZZXF 60-60-2000 MG-MG-U/ML CHEMO ~~LOC~~ SOLN
10.0000 mL | Freq: Once | SUBCUTANEOUS | Status: AC
  Administered 2019-09-21: 10 mL via SUBCUTANEOUS
  Filled 2019-09-21: qty 10

## 2019-09-21 MED ORDER — ACETAMINOPHEN 325 MG PO TABS
650.0000 mg | ORAL_TABLET | Freq: Once | ORAL | Status: AC
  Administered 2019-09-21: 650 mg via ORAL

## 2019-09-21 MED ORDER — ACETAMINOPHEN 325 MG PO TABS
ORAL_TABLET | ORAL | Status: AC
  Filled 2019-09-21: qty 2

## 2019-09-21 MED ORDER — DIPHENHYDRAMINE HCL 25 MG PO CAPS
25.0000 mg | ORAL_CAPSULE | Freq: Once | ORAL | Status: AC
  Administered 2019-09-21: 25 mg via ORAL

## 2019-09-21 NOTE — Patient Instructions (Signed)
[facility] Discharge Instructions for Patients Receiving Chemotherapy  Today you received the following Immunotherapy: PHESGO  To help prevent nausea and vomiting after your treatment, we encourage you to take your nausea medication as directed by your MD.   If you develop nausea and vomiting that is not controlled by your nausea medication, call the clinic.   BELOW ARE SYMPTOMS THAT SHOULD BE REPORTED IMMEDIATELY:  *FEVER GREATER THAN 100.5 F  *CHILLS WITH OR WITHOUT FEVER  NAUSEA AND VOMITING THAT IS NOT CONTROLLED WITH YOUR NAUSEA MEDICATION  *UNUSUAL SHORTNESS OF BREATH  *UNUSUAL BRUISING OR BLEEDING  TENDERNESS IN MOUTH AND THROAT WITH OR WITHOUT PRESENCE OF ULCERS  *URINARY PROBLEMS  *BOWEL PROBLEMS  UNUSUAL RASH Items with * indicate a potential emergency and should be followed up as soon as possible.  Feel free to call the clinic should you have any questions or concerns. The clinic phone number is [phone].  Please show the [place] at check-in to the Emergency Department and triage nurse.

## 2019-09-21 NOTE — Progress Notes (Signed)
Pt. received first dose of PHESGO right thigh and tolerated well. Remained for 30 minute post observation.Pt. left via ambulation, no respiratory distress noted.

## 2019-09-23 ENCOUNTER — Encounter: Payer: Self-pay | Admitting: Hematology

## 2019-09-24 ENCOUNTER — Other Ambulatory Visit: Payer: Self-pay | Admitting: Hematology

## 2019-09-24 ENCOUNTER — Telehealth: Payer: Self-pay | Admitting: Hematology

## 2019-09-24 MED FILL — POTASSIUM CHLORIDE CRYS ER: 20 | 30 days supply | Qty: 30 | Fill #0

## 2019-09-24 NOTE — Telephone Encounter (Signed)
Scheduled appt per 3/5 los.  Spoke with pt and she is aware of the appt date and time.

## 2019-09-25 ENCOUNTER — Other Ambulatory Visit: Payer: Self-pay | Admitting: Emergency Medicine

## 2019-09-25 DIAGNOSIS — C50919 Malignant neoplasm of unspecified site of unspecified female breast: Secondary | ICD-10-CM

## 2019-09-25 MED ORDER — OMEPRAZOLE 20 MG PO CPDR: 20.0000 mg | DELAYED_RELEASE_CAPSULE | Freq: Two times a day (BID) | ORAL | 2 refills | Status: DC

## 2019-10-01 MED FILL — DULOXETINE HCL 20 MG CPEP: 20 | 30 days supply | Qty: 30 | Fill #1

## 2019-10-02 ENCOUNTER — Encounter: Payer: Self-pay | Admitting: Hematology

## 2019-10-04 ENCOUNTER — Telehealth: Payer: Self-pay

## 2019-10-04 NOTE — Telephone Encounter (Signed)
Ms Dungan called requesting appts on 12/12/2019 be moved to the am.  Scheduling message sent.

## 2019-10-08 MED FILL — XARELTO 20 MG TABLET: 20 | 30 days supply | Qty: 30 | Fill #5

## 2019-10-08 MED FILL — SPIRONOLACTONE 25 MG TABS: 25 | 30 days supply | Qty: 30 | Fill #5

## 2019-10-12 ENCOUNTER — Ambulatory Visit (INDEPENDENT_AMBULATORY_CARE_PROVIDER_SITE_OTHER): Payer: Medicaid Other | Admitting: Podiatry

## 2019-10-12 ENCOUNTER — Inpatient Hospital Stay: Payer: Medicaid Other

## 2019-10-12 ENCOUNTER — Other Ambulatory Visit: Payer: Self-pay | Admitting: Hematology

## 2019-10-12 ENCOUNTER — Encounter: Payer: Self-pay | Admitting: Family Medicine

## 2019-10-12 ENCOUNTER — Other Ambulatory Visit: Payer: Self-pay

## 2019-10-12 ENCOUNTER — Ambulatory Visit (INDEPENDENT_AMBULATORY_CARE_PROVIDER_SITE_OTHER): Payer: Medicaid Other | Admitting: Family Medicine

## 2019-10-12 VITALS — BP 141/82 | HR 63 | Temp 98.0°F | Resp 18

## 2019-10-12 VITALS — BP 140/82 | HR 69 | Ht 64.0 in | Wt 140.8 lb

## 2019-10-12 DIAGNOSIS — K219 Gastro-esophageal reflux disease without esophagitis: Secondary | ICD-10-CM | POA: Diagnosis not present

## 2019-10-12 DIAGNOSIS — M778 Other enthesopathies, not elsewhere classified: Secondary | ICD-10-CM

## 2019-10-12 DIAGNOSIS — M7741 Metatarsalgia, right foot: Secondary | ICD-10-CM

## 2019-10-12 DIAGNOSIS — M7751 Other enthesopathy of right foot: Secondary | ICD-10-CM

## 2019-10-12 DIAGNOSIS — Z0000 Encounter for general adult medical examination without abnormal findings: Secondary | ICD-10-CM | POA: Diagnosis not present

## 2019-10-12 DIAGNOSIS — R1310 Dysphagia, unspecified: Secondary | ICD-10-CM | POA: Diagnosis not present

## 2019-10-12 DIAGNOSIS — C50919 Malignant neoplasm of unspecified site of unspecified female breast: Secondary | ICD-10-CM | POA: Diagnosis not present

## 2019-10-12 DIAGNOSIS — M79671 Pain in right foot: Secondary | ICD-10-CM

## 2019-10-12 DIAGNOSIS — Z5112 Encounter for antineoplastic immunotherapy: Secondary | ICD-10-CM | POA: Diagnosis not present

## 2019-10-12 DIAGNOSIS — Z7189 Other specified counseling: Secondary | ICD-10-CM

## 2019-10-12 MED ORDER — PERTUZ-TRASTUZ-HYALURON-ZZXF 60-60-2000 MG-MG-U/ML CHEMO ~~LOC~~ SOLN
10.0000 mL | Freq: Once | SUBCUTANEOUS | Status: DC
  Filled 2019-10-12: qty 10

## 2019-10-12 MED ORDER — OMEPRAZOLE 20 MG PO CPDR: 40.0000 mg | DELAYED_RELEASE_CAPSULE | Freq: Two times a day (BID) | ORAL | 2 refills | Status: DC

## 2019-10-12 MED ORDER — SODIUM CHLORIDE 0.9% FLUSH
10.0000 mL | INTRAVENOUS | Status: DC | PRN
  Administered 2019-10-12: 10 mL
  Filled 2019-10-12: qty 10

## 2019-10-12 MED ORDER — ACETAMINOPHEN 325 MG PO TABS
650.0000 mg | ORAL_TABLET | Freq: Once | ORAL | Status: AC
  Administered 2019-10-12: 650 mg via ORAL

## 2019-10-12 MED ORDER — SODIUM CHLORIDE 0.9 % IV SOLN
420.0000 mg | Freq: Once | INTRAVENOUS | Status: AC
  Administered 2019-10-12: 420 mg via INTRAVENOUS
  Filled 2019-10-12: qty 14

## 2019-10-12 MED ORDER — HEPARIN SOD (PORK) LOCK FLUSH 100 UNIT/ML IV SOLN
500.0000 [IU] | Freq: Once | INTRAVENOUS | Status: AC | PRN
  Administered 2019-10-12: 500 [IU]
  Filled 2019-10-12: qty 5

## 2019-10-12 MED ORDER — TRASTUZUMAB-DKST CHEMO 150 MG IV SOLR
6.0000 mg/kg | Freq: Once | INTRAVENOUS | Status: AC
  Administered 2019-10-12: 399 mg via INTRAVENOUS
  Filled 2019-10-12: qty 19

## 2019-10-12 MED ORDER — ACETAMINOPHEN 325 MG PO TABS
ORAL_TABLET | ORAL | Status: AC
  Filled 2019-10-12: qty 2

## 2019-10-12 MED ORDER — DIPHENHYDRAMINE HCL 25 MG PO CAPS
ORAL_CAPSULE | ORAL | Status: AC
  Filled 2019-10-12: qty 1

## 2019-10-12 MED ORDER — SODIUM CHLORIDE 0.9 % IV SOLN
Freq: Once | INTRAVENOUS | Status: AC
  Filled 2019-10-12: qty 250

## 2019-10-12 MED ORDER — DIPHENHYDRAMINE HCL 25 MG PO CAPS
25.0000 mg | ORAL_CAPSULE | Freq: Once | ORAL | Status: AC
  Administered 2019-10-12: 25 mg via ORAL

## 2019-10-12 NOTE — Patient Instructions (Addendum)
It was very nice to meet you today. Please enjoy the rest of your week. Increase omeprazole to 40 mg twice daily.  Follow up in 2-4 weeks for pap smear and to follow up on GERD symptoms.   Please call the clinic at [phone] if your symptoms worsen or you have any concerns. It was our pleasure to serve you.

## 2019-10-12 NOTE — Patient Instructions (Signed)
[facility] Discharge Instructions for Patients Receiving Chemotherapy  Today you received the following chemotherapy agents Trastuzumab (OGIVRI) & Pertuzumab (PERJETA).  To help prevent nausea and vomiting after your treatment, we encourage you to take your nausea medication as prescribed.   If you develop nausea and vomiting that is not controlled by your nausea medication, call the clinic.   BELOW ARE SYMPTOMS THAT SHOULD BE REPORTED IMMEDIATELY:  *FEVER GREATER THAN 100.5 F  *CHILLS WITH OR WITHOUT FEVER  NAUSEA AND VOMITING THAT IS NOT CONTROLLED WITH YOUR NAUSEA MEDICATION  *UNUSUAL SHORTNESS OF BREATH  *UNUSUAL BRUISING OR BLEEDING  TENDERNESS IN MOUTH AND THROAT WITH OR WITHOUT PRESENCE OF ULCERS  *URINARY PROBLEMS  *BOWEL PROBLEMS  UNUSUAL RASH Items with * indicate a potential emergency and should be followed up as soon as possible.  Feel free to call the clinic should you have any questions or concerns. The clinic phone number is [phone].  Please show the [facility] at check-in to the Emergency Department and triage nurse.

## 2019-10-12 NOTE — Progress Notes (Signed)
    SUBJECTIVE:   CHIEF COMPLAINT / HPI:   Teresa Hays is a 64 yo F who presents today to establish care.   She was recently diagnosed with metastatic stage IV breast cancer in 2019 that has since spread to her liver. She goes to the cancer center every 3 weeks and she is currently receiving injection chemo treatment and CT scan every 6 months. Her sister had stage II breast cancer that is now in remission with the help of radiation. There is no other known family history of breast cancer. 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 She is being treated at [facility] for right foot pain and wears a boot currently.  Dysphagia Has a history of GERD but recently has been experiencing increasing wheezing most pronounced at night. She sought care with a pulmonologist who felt this was a GERD exacerbation and increased her PPI. Her omeprazole has been helping some but not lately. She is having trouble swallowing both solids and liquids. She does not endorse nausea or vomiting. No shortness of breath.  PERTINENT  PMH / PSH: GERD, HTN, Port-a-cath, Seasonal allergies  OBJECTIVE:   BP 140/82   Pulse 69   Ht 5\' 4"  (1.626 m)   Wt 140 lb 12.8 oz (63.9 kg)   SpO2 99%   BMI 24.17 kg/m    General: Appears well, no acute distress. Age appropriate. Cardiac: RRR, normal heart sounds, no murmurs Respiratory: Expiratory wheezing bilaterally, normal effort Abdomen: soft, nontender, non distended, +BS  ASSESSMENT/PLAN:   Dysphagia Etiology unknown at this time although could very well be due to GERD but it is pertinent to not miss metastasis.  -Referral to gastroenterology -Increase omeprazole to 40mg  BID  -Follow up if symptoms worsen  Gastroesophageal reflux disease -Increase omeprazole to 40mg  BID  Health maintenance examination -HIV, Hep C NR -Return for pap in  2-4 weeks    [name], [place]

## 2019-10-12 NOTE — Progress Notes (Signed)
Pharmacy did not have Phesgo in stock today. Dr. Burr Medico ok'd for pt to get Ogivri + Perjeta IV today.  Pt in agreement. Orders updated.  Kennith Center, Pharm.D., CPP 10/12/2019@11 :13 AM

## 2019-10-12 NOTE — Progress Notes (Signed)
Per Dr. Burr Medico, patient does not need labs today.

## 2019-10-13 LAB — HEPATITIS C ANTIBODY (REFLEX): HCV Ab: <0.1 s/co ratio (ref 0.0–0.9)

## 2019-10-13 LAB — HCV COMMENT:

## 2019-10-13 LAB — HIV ANTIBODY (ROUTINE TESTING W REFLEX): HIV Screen 4th Generation wRfx: NONREACTIVE

## 2019-10-15 ENCOUNTER — Encounter: Payer: Self-pay | Admitting: Podiatry

## 2019-10-15 ENCOUNTER — Telehealth: Payer: Self-pay | Admitting: *Deleted

## 2019-10-15 MED FILL — OMEPRAZOLE 20 MG CAP: 20 | 30 days supply | Qty: 60 | Fill #1

## 2019-10-15 MED FILL — GABAPENTIN 300 MG CAPSULE: 300 | 30 days supply | Qty: 150 | Fill #1

## 2019-10-15 NOTE — Telephone Encounter (Signed)
-----   Message from [facility], DO sent at 10/15/2019 10:45 AM EDT ----- I have reviewed all lab results which are normal. Attempted to call patient 3/29. Please inform the patient.

## 2019-10-15 NOTE — Progress Notes (Signed)
Subjective:  Patient ID: Teresa Hays, female    DOB: [birth year],  MRN: [id number]  Chief Complaint  Patient presents with  . Foot Pain    right foot , 6 week follow up    64 y.o. female presents with the above complaint.  Patient presents with a follow-up of right fourth and fifth digit capsulitis.  Patient states that she is not doing any better than last time.  She states the injection helped for a little bit but then wore off.  She thinks that there is also component of her neuropathy from diabetes that could be attributing to the pain.  The postop shoe has also not been helping.  She has not tried anything else.  She would like to know what her her state neck steps for management and treatment.   Review of Systems: Negative except as noted in the HPI. Denies N/V/F/Ch.  Past Medical History:  Diagnosis Date  . GERD (gastroesophageal reflux disease)   . Hypertension   . rt breast ca with mets to liver dx'd 03/2018    Current Outpatient Medications:  .  amLODipine (NORVASC) 5 MG tablet, Take 5 mg by mouth daily. , Disp: , Rfl:  .  carvedilol (COREG) 3.125 MG tablet, Take 1 tablet (3.125 mg total) by mouth 2 (two) times daily., Disp: 180 tablet, Rfl: 3 .  diphenoxylate-atropine (LOMOTIL) 2.5-0.025 MG tablet, Take 1-2 tablets by mouth 4 (four) times daily as needed for diarrhea or loose stools., Disp: 60 tablet, Rfl: 1 .  DULoxetine (CYMBALTA) 20 MG capsule, TAKE 1 CAPSULE BY MOUTH ONCE DAILY, Disp: 30 capsule, Rfl: 1 .  gabapentin (NEURONTIN) 300 MG capsule, TAKE 1 CAPSULE BY MOUTH IN THE MORNING, 1 CAPSULE MIDDAY AND 3 CAPSULES AT BEDTIME, Disp: 150 capsule, Rfl: 3 .  letrozole (FEMARA) 2.5 MG tablet, TAKE 1 TABLET (2.5 MG TOTAL) BY MOUTH DAILY., Disp: 90 tablet, Rfl: 1 .  lidocaine-prilocaine (EMLA) cream, Apply to affected area once, Disp: 30 g, Rfl: 3 .  losartan (COZAAR) 50 MG tablet, Take 1 tablet (50 mg total) by mouth daily., Disp: 90 tablet, Rfl: 3 .  omeprazole  (PRILOSEC) 20 MG capsule, Take 2 capsules (40 mg total) by mouth 2 (two) times daily before a meal., Disp: 60 capsule, Rfl: 2 .  potassium chloride SA (KLOR-CON) 20 MEQ tablet, TAKE 1 TABLET BY MOUTH ONCE DAILY, Disp: 30 tablet, Rfl: 0 .  spironolactone (ALDACTONE) 25 MG tablet, Take 1 tablet (25 mg total) by mouth daily., Disp: 30 tablet, Rfl: 6 .  traMADol (ULTRAM) 50 MG tablet, Take 1 tablet (50 mg total) by mouth every 6 (six) hours as needed for moderate pain or severe pain., Disp: 30 tablet, Rfl: 0 .  XARELTO 20 MG TABS tablet, TAKE 1 TABLET (20 MG TOTAL) BY MOUTH DAILY WITH SUPPER., Disp: 30 tablet, Rfl: 5  Social History   Tobacco Use  Smoking Status Never Smoker  Smokeless Tobacco Never Used    No Known Allergies Objective:  There were no vitals filed for this visit. There is no height or weight on file to calculate BMI. Constitutional Well developed. Well nourished.  Vascular Dorsalis pedis pulses palpable bilaterally. Posterior tibial pulses palpable bilaterally. Capillary refill normal to all digits.  No cyanosis or clubbing noted. Pedal hair growth normal.  Neurologic Normal speech. Oriented to person, place, and time. Epicritic sensation to light touch grossly present bilaterally.  Dermatologic Nails well groomed and normal in appearance. No open wounds. No skin lesions.  Orthopedic:  Pain on palpation to the right fifth metatarsal phalangeal joint and right fourth metatarsophalangeal joint.  Mild pain with range of motion of the joint.  Negative Mulder's click.  However unable to differentiate between the interspace as well as the capsulitis from the joint as it is very narrow.   Radiographs: 2 views of skeletally mature adult foot: 2 views of skeletally mature adult foot.  There is decrease in first metatarsophalangeal joint space even in nature consistent with mild arthritic changes.  No other bony abnormalities noted.  There is adductovarus component of the fifth  digit.  No midfoot arthritis.  Mild posterior and plantar heel spur noted.  Hammertoe contracture of the second digit noted. Assessment:   1. Capsulitis of foot, right   2. Capsulitis of toe of right foot   3. Metatarsalgia of right foot   4. Foot pain, right    Plan:  Patient was evaluated and treated and all questions answered.  Right fourth and fifth digit metatarsophalangeal joint capsulitis secondary to metatarsalgia -I explained to the patient the etiology and various treatment options associated with the right fourth and fifth digit capsulitis.  I believe this has to do with her shoes that are very narrow the front and not supportive.  I discussed in extensive length about shoe gear modification.  Patient states understanding will attempt to get new shoes.   -I will hold off on a steroid injection as it has not been helping. -I discussed with the patient that if the metatarsal pad helps offload some of the pain associated with it we can consider doing orthotics during next visit.  Patient states understanding will do that. -Metatarsal pad was given to help take some of the pressure away from the lesser metatarsals.   Return in about 4 weeks (around 11/09/2019) for right foot pain.

## 2019-10-15 NOTE — Telephone Encounter (Signed)
Pt informed. Guled Gahan, CMA  

## 2019-10-16 DIAGNOSIS — K219 Gastro-esophageal reflux disease without esophagitis: Secondary | ICD-10-CM | POA: Insufficient documentation

## 2019-10-16 DIAGNOSIS — Z0000 Encounter for general adult medical examination without abnormal findings: Secondary | ICD-10-CM | POA: Insufficient documentation

## 2019-10-16 NOTE — Assessment & Plan Note (Addendum)
-   1 of 2 COVID vaccine completed -Need shingrix and Tdap when COVID vaccine completed -HIV, Hep C NR -Return for pap in 2-4 weeks

## 2019-10-16 NOTE — Assessment & Plan Note (Addendum)
-  Increase omeprazole to 40mg  BID

## 2019-10-16 NOTE — Assessment & Plan Note (Addendum)
Etiology unknown at this time although could very well be due to GERD but it is pertinent to not miss metastasis.  -Referral to gastroenterology -Increase omeprazole to 40mg  BID  -Follow up if symptoms worsen

## 2019-10-22 MED FILL — LOSARTAN POTASSIUM 50 MG TA: 50 | 30 days supply | Qty: 30 | Fill #7

## 2019-10-23 ENCOUNTER — Encounter: Payer: Self-pay | Admitting: Family Medicine

## 2019-10-23 ENCOUNTER — Encounter: Payer: Self-pay | Admitting: Hematology

## 2019-10-23 NOTE — Telephone Encounter (Signed)
I spoke with Teresa Hays she states she has chest congestion coughing up clear/white phlem.  She deniesfever, chills, dyspnea.  I recommended increasing her water intake, using a humidifier in her bedroon while she sleeps and deep breathing exercises.  I told her she can try musinex DM to bbreak up the congestion.  I told her to call us if her cough worsens, she develops fever or shortness of breath.  She verbalized understanding.

## 2019-10-28 ENCOUNTER — Encounter: Payer: Self-pay | Admitting: Family Medicine

## 2019-10-28 DIAGNOSIS — K219 Gastro-esophageal reflux disease without esophagitis: Secondary | ICD-10-CM

## 2019-10-28 DIAGNOSIS — I10 Essential (primary) hypertension: Secondary | ICD-10-CM

## 2019-10-29 ENCOUNTER — Other Ambulatory Visit: Payer: Self-pay | Admitting: Hematology

## 2019-10-29 MED FILL — POTASSIUM CHLORIDE CRYS ER: 20 | 30 days supply | Qty: 30 | Fill #0

## 2019-10-29 MED FILL — DULOXETINE HCL 20 MG CPEP: 20 | 30 days supply | Qty: 30 | Fill #0

## 2019-10-29 MED FILL — LETROZOLE 2.5 MG TABLET: 2.5 | 90 days supply | Qty: 90 | Fill #1

## 2019-10-29 NOTE — Progress Notes (Signed)
Pharmacist Chemotherapy Monitoring - Follow Up Assessment    I verify that I have reviewed each item in the below checklist:  . Regimen for the patient is scheduled for the appropriate day and plan matches scheduled date. Marland Kitchen Appropriate non-routine labs are ordered dependent on drug ordered. . If applicable, additional medications reviewed and ordered per protocol based on lifetime cumulative doses and/or treatment regimen.   Plan for follow-up and/or issues identified: No . I-vent associated with next due treatment: No . MD and/or nursing notified: No  Carlette Palmatier D 10/29/2019 2:29 PM

## 2019-10-29 NOTE — Progress Notes (Signed)
[place]   [phone] [phone]   Clinic Follow up Note   Patient Care Team: [name], DO as PCP - General (Family Medicine) [name], MD as Consulting Physician (Gastroenterology) [name], MD as Consulting Physician (Hematology)  Date of Service:  11/02/2019  CHIEF COMPLAINT: F/u for metastatic breast cancer  SUMMARY OF ONCOLOGIC HISTORY: Oncology History Overview Note  Cancer Staging Metastatic breast cancer [facility]) Staging form: Breast, AJCC 8th Edition - Clinical stage from 03/24/2018: Stage IV (cT2, cN1, pM1, G3, ER+, PR+, HER2+) - Signed by [name], MD on 03/30/2018     Metastatic breast cancer ([place])  01/30/2018 Procedure   Colonoscopy showed small polyp in the sigmoid colon, removed, the exam of colon including the terminal ileum was otherwise negative.   01/30/2018 Procedure   EGD by Dr. Collene Mares showed small hiatal hernia, a 8 mm polypoid lesion in the cardia, biopsied.   03/09/2018 Imaging   03/09/2018 US Abdomen IMPRESSION: 1. Mass lesions throughout the liver, consistent with metastatic disease. Liver as a somewhat nodular contour suggesting underlying hepatic cirrhosis. Inhomogeneous echotexture to the liver.  2. Cholelithiasis with mild gallbladder wall thickening. A degree of cholecystitis cannot be excluded by ultrasound.  3. Portions of pancreas obscured by gas. Visualized portions of pancreas appear normal.  4. Small right kidney. Etiology uncertain. This finding potentially may be indicative of renal artery stenosis. In this regard, question whether patient is hypertensive.   03/15/2018 Imaging   CT CAP with contrast  IMPRESSION: 1. Widespread hepatic metastasis. 2. 2.6 cm lateral right breast soft tissue nodule could represent a breast primary or an incidental benign lesion. Consider correlation with mammogram and ultrasound. 3. No definite source of primary malignancy identified within the abdomen or  pelvis. There is possible rectosigmoid junction wall thickening. Consider colonoscopy with attention to this area. 4. Distal esophageal wall thickening, suggesting esophagitis.   03/24/2018 Cancer Staging   Staging form: Breast, AJCC 8th Edition - Clinical stage from 03/24/2018: Stage IV (cT2, cN1, pM1, G3, ER+, PR+, HER2+) - Signed by [name], MD on 03/30/2018   03/24/2018 Initial Biopsy   Diagnosis 1. 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 Breast, right, needle core biopsy, 9 o'clock, 7cm from nipple - INVASIVE DUCTAL CARCINOMA. -The carcinoma is somewhat morphologically dissimilar from that in part 1. It appears grade III 3. Lymph node, needle/core biopsy, right axillary - METASTATIC CARCINOMA IN 1 OF 1 LYMPH NODE (1/1).   03/24/2018 Receptors her2   Breast biopsy: 1. Estrogen Receptor: 40%, POSITIVE, STRONG-MODERATE STAINING INTENSITY Progesterone Receptor: 70%, POSITIVE, STRONG STAINING INTENSITY Proliferation Marker Ki67: 20% HER 2 equivocal by IHC 2+, POSITIVE by FISH, ratio 2.4 and copy #4.2  2. Estrogen Receptor: 60%, POSITIVE, MODERATE STAINING INTENSITY Progesterone Receptor: 40%, POSITIVE, MODERATE STAINING INTENSITY Proliferation Marker Ki67: 20% HER2 (+) by IHC 3+   03/24/2018 Initial Diagnosis   Metastatic breast cancer ([place])   03/27/2018 Pathology Results   Diagnosis Liver, needle/core biopsy, Right - METASTATIC CARCINOMA TO LIVER, CONSISTENT WITH PATIENTS CLINICAL HISTORY OF PRIMARY BREAST CARCINOMA.  ER 80%+ PR40%+ HER2- (by [facility], IHC 2+)  Ki67 50%    03/28/2018 Pathology Results   03/28/2018 Surgical Pathology Diagnosis 1. Breast, left, needle core biopsy, 9 o'clock - FIBROCYSTIC CHANGES. - THERE IS NO EVIDENCE OF MALIGNANCY. 2. Breast, left, needle core biopsy, 2 o'clock - FIBROADENOMA. - THERE IS NO EVIDENCE OF  MALIGNANCY. - SEE COMMENT.   03/29/2018 Imaging    03/29/2018 Bone Scan IMPRESSION: No scintigraphic evidence of osseous metastatic disease.   03/30/2018 Imaging   Bone scan  IMPRESSION: No scintigraphic evidence of osseous metastatic disease.    04/07/2018 -  Chemotherapy   First line chemo weekly Taxol and herceptin/Perjeta every 3 weeks starting 04/07/18. She developed infusion reaction to taxol and it was discontinued. Added Abraxane on C1D8, 2 weeks on/1 week off.  Abraxne stopped after 10/13/18 due to worsening Neuropathy. She has continued with maintenance Herceptin injection/Perjeta q3weeks. Switched to Herceptin injections on 09/21/19.    06/06/2018 Imaging   CT CAP IMPRESSION: 1. Generally improved appearance, with reduced axillary adenopathy and reduced enhancing component of the hepatic masses, with some of the hepatic mass is moderately smaller than on the prior exam. Reduced size of the right lateral breast mass compared to the prior 03/15/2018 exam. 2. New mild interstitial accentuation in the lungs, significance uncertain. Part of this appearance may be due to lower lung volumes on today's exam. 3. Mild wall thickening in the descending colon and upper rectum suggesting low-grade colitis/inflammation. Prominent stool throughout the colon favors constipation. 4. Other imaging findings of potential clinical significance: Aortic Atherosclerosis (ICD10-I70.0). Mild cardiomegaly. Mild nodularity in the right lower lobe appears stable. Contracted and thick-walled gallbladder.    09/18/2018 Imaging   CT CAP W Contrast 09/18/18  IMPRESSION: 1. Liver metastases have decreased in size. 2. Right breast mass, mild right axillary lymphadenopathy and scattered tiny right pulmonary nodules are all stable. 3. New mild left supraclavicular and left subpectoral lymphadenopathy, can not exclude progression of metastatic nodal disease. 4. Moderate colorectal stool volume, which may indicate constipation. 5.  Aortic Atherosclerosis (ICD10-I70.0).    10/2018 -  Anti-estrogen oral therapy   Letrozole 2.5 mg daily starting 10/2018   01/10/2019 Imaging   CT CAP W Contrast 01/10/19  IMPRESSION: 1. Continued improvement in the hepatic metastatic lesions which have reduced in size. 2. Stable mild left supraclavicular and subpectoral adenopathy. 3. Essentially stable small right lower lobe pulmonary nodule and separate small subpleural nodule along the right hemidiaphragm. Surveillance suggested. 4. Other imaging findings of potential clinical significance: Mild cardiomegaly. Mild circumferential distal esophageal wall thickening, the most common cause would be esophagitis. Airway thickening is present, suggesting bronchitis or reactive airways disease. Airway plugging in the lower lobes and in the right middle lobe. Stable small right adrenal adenoma.   05/15/2019 Imaging   CT CAP W Contrast 05/15/19  IMPRESSION: CT CHEST IMPRESSION   1. Similar appearance of borderline supraclavicular, axillary, and subpectoral adenopathy. 2. Improved and resolved right lower lobe pulmonary nodularity. 3. Esophageal air fluid level suggests dysmotility or gastroesophageal reflux.   CT ABDOMEN AND PELVIS IMPRESSION   1. Improved hepatic metastasis. 2. Small bowel mesenteric lymph nodes which are upper normal and mildly enlarged. Likely increased and similar as detailed above. Indeterminate. Recommend attention on follow-up. 3. Cholelithiasis. 4. Motion degradation throughout the lower chest and abdomen.   09/19/2019 Imaging   CT CAP W contrast  IMPRESSION: 1. Interval decrease in size of right axillary and subpectoral lymph nodes. There are no pathologically enlarged lymph nodes remaining in the chest, abdomen, or pelvis. No new lymphadenopathy. 2. No significant change in post treatment appearance of multiple low-attenuation liver lesions, in keeping with treated metastases. 3. No evidence of new metastatic disease in the chest, abdomen,  or pelvis. 4. Aortic Atherosclerosis (ICD10-I70.0).      CURRENT THERAPY:  -First line  weekly TaxolwithHerceptin/Perjeta every 3 weeks starting 04/07/18. She developed infusion reaction to taxol and it was discontinued. Changed to Abraxaneweekly2 weeks on/1 week offfrom 07/21/2018.Abraxane held after October 13, 2018 due to his worsening neuropathy.She will continue Herceptin injection/Perjeta q3weeksmaintenance therapy.Herceptin switched to injection on 09/21/19.  -Letrozole 2.5 mg daily starting 10/2018   INTERVAL HISTORY:  Teresa Hays is here for a follow up and treatment. She presents to the clinic alone. She notes she is doing well. She notes she has wheezing again and coughing up mild yellowish phlegm. She was seen by pulmonologist Dr Valeta Harms before who increased her Prilosec. He notes it is her acid reflux. She is not able to sleep flat because she feels more wheezing and cough production and tries to sleep propped up. She plans to get a recliner soon. When sitting up she is fine. She notes her PCP recommended an endoscopy due to her recent throat symptoms. She notes it has not been easy to swallow occasionally when she has dry mouth and tightness of throat. She can still swallow liquids, foods and pills, but more related to how her throat feels. She notes she still has Right foot pain in her heel and ball. She wears a foot boot currently and continues to f/u with podiatrist. She notes mild posterior HAs. She notes she has been having trouble with her memory. She notes she is frustrated and overwhelmed with her persistent symptoms and was emotional in visit today.    REVIEW OF SYSTEMS:   Constitutional: Denies fevers, chills or abnormal weight loss (+) Posterior HA Eyes: Denies blurriness of vision Ears, nose, mouth, throat, and face: Denies mucositis or sore throat (+) Tightness and dryness of throat  Respiratory: Denies dyspnea (+) wheezes  (+) cough with mild yellowing phlegm   Cardiovascular: Denies palpitation, chest discomfort or lower extremity swelling Gastrointestinal:  Denies nausea, heartburn or change in bowel habits Skin: Denies abnormal skin rashes Lymphatics: Denies new lymphadenopathy or easy bruising Neurological:Denies numbness, tingling or new weaknesses Behavioral/Psych: Mood is stable, no new changes  All other systems were reviewed with the patient and are negative.  MEDICAL HISTORY:  Past Medical History:  Diagnosis Date  . GERD (gastroesophageal reflux disease)   . Hypertension   . rt breast ca with mets to liver dx'd 03/2018    SURGICAL HISTORY: Past Surgical History:  Procedure Laterality Date  . COLONOSCOPY    . ESOPHAGOGASTRODUODENOSCOPY ENDOSCOPY    . IR IMAGING GUIDED PORT INSERTION  04/04/2018    I have reviewed the social history and family history with the patient and they are unchanged from previous note.  ALLERGIES:  has No Known Allergies.  MEDICATIONS:  Current Outpatient Medications  Medication Sig Dispense Refill  . amLODipine (NORVASC) 5 MG tablet Take 1 tablet (5 mg total) by mouth daily. 30 tablet 2  . carvedilol (COREG) 3.125 MG tablet Take 1 tablet (3.125 mg total) by mouth 2 (two) times daily. 180 tablet 3  . diphenoxylate-atropine (LOMOTIL) 2.5-0.025 MG tablet Take 1-2 tablets by mouth 4 (four) times daily as needed for diarrhea or loose stools. 60 tablet 1  . DULoxetine (CYMBALTA) 20 MG capsule TAKE 1 CAPSULE BY MOUTH ONCE DAILY 30 capsule 1  . gabapentin (NEURONTIN) 300 MG capsule TAKE 1 CAPSULE BY MOUTH IN THE MORNING, 1 CAPSULE MIDDAY AND 3 CAPSULES AT BEDTIME 150 capsule 3  . letrozole (FEMARA) 2.5 MG tablet TAKE 1 TABLET (2.5 MG TOTAL) BY MOUTH DAILY. 90 tablet 1  . lidocaine-prilocaine (EMLA)  cream Apply to affected area once 30 g 3  . losartan (COZAAR) 50 MG tablet Take 1 tablet (50 mg total) by mouth daily. 90 tablet 3  . omeprazole (PRILOSEC) 20 MG capsule TAKE 2 CAPSULES BY MOUTH TWO TIMES DAILY  BEFORE A MEAL 60 capsule 2  . potassium chloride SA (KLOR-CON) 20 MEQ tablet TAKE 1 TABLET BY MOUTH ONCE DAILY 30 tablet 0  . spironolactone (ALDACTONE) 25 MG tablet Take 1 tablet (25 mg total) by mouth daily. 30 tablet 6  . traMADol (ULTRAM) 50 MG tablet Take 1 tablet (50 mg total) by mouth every 6 (six) hours as needed for moderate pain or severe pain. 30 tablet 0  . XARELTO 20 MG TABS tablet TAKE 1 TABLET (20 MG TOTAL) BY MOUTH DAILY WITH SUPPER. 30 tablet 5   No current facility-administered medications for this visit.   Facility-Administered Medications Ordered in Other Visits  Medication Dose Route Frequency Provider Last Rate Last Admin  . 0.9 %  sodium chloride infusion   Intravenous Continuous [name], MD 500 mL/hr at 11/02/19 0950 New Bag at 11/02/19 0950  . pertuz-trastuz-hyaluron-zzxf (PHESGO) 600-600-20000 MG-MG-U/10ML chemo SQ injection maintenance dose 10 mL  10 mL Subcutaneous Once [name], MD        PHYSICAL EXAMINATION: ECOG PERFORMANCE STATUS: 2 - Symptomatic, <50% confined to bed  Vitals:   11/02/19 0834  BP: (!) 147/86  Pulse: 76  Resp: 18  Temp: 98.5 F (36.9 C)  SpO2: 100%   Filed Weights   11/02/19 0834  Weight: 140 lb 14.4 oz (63.9 kg)    GENERAL:alert, no distress and comfortable SKIN: skin color, texture, turgor are normal, no rashes or significant lesions EYES: normal, Conjunctiva are pink and non-injected, sclera clear  NECK: supple, thyroid normal size, non-tender, without nodularity LYMPH:  no palpable lymphadenopathy in the cervical, axillary  LUNGS: clear to auscultation and percussion (+) b/l wheezes HEART: regular rate & rhythm and no murmurs and no lower extremity edema ABDOMEN:abdomen soft, non-tender and normal bowel sounds (+) boot on right foot  Musculoskeletal:no cyanosis of digits and no clubbing  NEURO: alert & oriented x 3 with fluent speech, no focal motor/sensory deficits  LABORATORY DATA:  I have reviewed the data as  listed CBC Latest Ref Rng & Units 11/02/2019 09/19/2019 08/29/2019  WBC 4.0 - 10.5 K/uL 7.3 6.8 7.3  Hemoglobin 12.0 - 15.0 g/dL 10.7(L) 11.1(L) 11.4(L)  Hematocrit 36.0 - 46.0 % 33.7(L) 34.3(L) 34.6(L)  Platelets 150 - 400 K/uL 219 192 212     CMP Latest Ref Rng & Units 11/02/2019 09/19/2019 08/29/2019  Glucose 70 - 99 mg/dL 106(H) 91 96  BUN 8 - 23 mg/dL _0 Creatinine 0.44 - 1.00 mg/dL 1.35(H) 1.27(H) 1.11(H)  Sodium 135 - 145 mmol/L 139 140 140  Potassium 3.5 - 5.1 mmol/L 4.1 4.3 4.4  Chloride 98 - 111 mmol/L 107 104 105  CO2 22 - 32 mmol/L 21(L) 27 25  Calcium 8.9 - 10.3 mg/dL 9.1 9.8 9.8  Total Protein 6.5 - 8.1 g/dL 7.4 7.8 8.0  Total Bilirubin 0.3 - 1.2 mg/dL 0.4 0.4 0.3  Alkaline Phos 38 - 126 U/L 136(H) 139(H) 160(H)  AST 15 - 41 U/L _1 ALT 0 - 44 U/L _2 RADIOGRAPHIC STUDIES: I have personally reviewed the radiological images as listed and agreed with the findings in the report. No results found.   ASSESSMENT & PLAN:  Teresa  BRAYLIE Hays is a 64 y.o. female with    1. 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 Liver met was HER2-.  -Given the metastatic disease, her cancer is not curable but still treatable. Shehas been treated withLetrozole daily andFirst-line Herceptin/Perjetaevery 3 weeks and weekly Taxol. Due to infusionreaction, taxol was switched to Abraxane.We stoppedAbraxaneafter cycle 10 due to worsening neuropathyand continued with Herceptin/Perjetaas maintenance therapy. -Her CT CAP from 09/19/19 showed she is responding to treatment. I discussed given good response there is a chance she can proceed with right Lumpectomy and axillary LN dissection to  reduce her tumor burden. She is open to this. If next PET scan 4 months later looks good will refer her to surgeon.  -Will continue current treatment as it is controlling her disease. We discussed that she still has several treatment options if she progresses on this regimen.  -Lab reviewed, her CBC and CMP are within normal limits except Hg 10.7, BG 106, Cr 1.35, Alk Phos 136. Her physical exam showed b/l wheezes which as recurred. She still has right foot pain that is being worked up by her podiatrist.  -She notes recent mild posterior headaches and trouble with her memory and appears to have mild trouble with her world findings today. She also notes tightness and dryness of throat. I recommend Brain MRI to rule brain metastasis related to her breast cancer. She is agreeable.  -She will proceed with maintenance Herceptin injection and Perjeta today -Continue Letrozole  -F/u in 3 weeks   2.Peripheral neuropathy, grade 2, Right Foot pain  -Secondarytochemotherapy,mainly in her feet and tingling in her fingers.  -Ipreviouslystropped Abraxane on 10/13/18. -She is currently on Oral B12,Gabapentin327m 5 tabs daily and Tramadol 1 tab every 1-2 nights and low dose Cymbalta. -She will also continue OTC Calcium and Vit D.  -She has tried PT twice and does not feel it helps as much as she does not feel like a fall risk. -Her main pain is in the heal and ball of her right foot. She has received steroid injections to right foot from podiatrist which has improved her pain. Her podiatrist feels her pain is related to her toes and will continue to f/u with him for more work up.  -She overall still has adequate functions and ambulation.   4.Left UEDVT, Right LE edema -she developed LUE DVT on 08/04/2018, edema has resolved after anticoagulation -Continue onXarelto 280mindefinitely, unless she has significant bleeding or other side effects. -Shepreviouslydeveloped LE edema, right  significantly more than left.10/13/18 doppler was negative for DVT.  -She was previously treated with Lasix. Right LE edema resolved.  5. Anemia -She developed worseningwhen she was on Chemo, now she is off. -Continue monitoring, will consider blood transfusion if hemoglobin less than 8. -Mild and stable.   6Social support  -She lives alone, [place]  -On Ativan PRN due to anxietyabout diagnosis and treatment  7. Goal of care discussion  -she is full code -She understands the goal of care is palliative, to prolong her life, and improve her quality of life.  8. Recurrentdyspnea and wheezing, Tightness of throat -She has been seen by Dr. IcValeta Harmsho notes this is related to her post  nasal drip and her Acid reflux can contribute. Her started her on Prilosec is taken 2 tabs BID and nasal spray.  -This has recurred again with more wheezing at night and mild yellowish phlegm with productive cough. She notes this worsens only when she lays flat so she sits propped up.  -I recommend she f/u with Dr Valeta Harms soon. I will call in stronger PPI, Dexilant (11/02/19).    9. Elevated Cr  -Has been fluctuating lately.  -Cr has increased again to 1.35 today (11/02/19). I recommend she increase water intake. I will give IV Fluids today, she is agreeable.    10. Tightness of throat, Mild HAs -She notes it has not been easy to swallow occasionally when she has dry mouth and tightness of throat. She can still swallow liquids, foods and pills, but it's more related to how her throat feels. Her PCP recommended she have an endoscopy. She was previously seen by Dr. Collene Mares.  -She notes she has been having mild posterior HA's and effects on her memory. She also appears to have mild trouble in her speech.  -I recommend Brain MRI for further eval. She is agreeable.    Plan: -I called in Dexilant 64m daily today  -Labs reviewed and  adequate to proceed with Herceptin injection and Perjeta today -IV Fluids today for elevated Cr -Brain MRI in 1-2 weeks w and wo contrast, phone call after scan  -continue letrozole -lab, flush, F/u and Herceptin/Perjeta in 3 weeks  -Copy note to Dr IValeta Harms   No problem-specific Assessment & Plan notes found for this encounter.   Orders Placed This Encounter  Procedures  . MR Brain W Wo Contrast    Standing Status:   Future    Standing Expiration Date:   11/01/2020    Order Specific Question:   If indicated for the ordered procedure, I authorize the administration of contrast media per Radiology protocol    Answer:   Yes    Order Specific Question:   What is the patient's sedation requirement?    Answer:   No Sedation    Order Specific Question:   Does the patient have a pacemaker or implanted devices?    Answer:   No    Order Specific Question:   Use SRS Protocol?    Answer:   No    Order Specific Question:   Radiology Contrast Protocol - do NOT remove file path    Answer:   _0 [email or web address]    Order Specific Question:   [facility]?    [facility] lbs[facility]   All questions were answered. The patient knows to call the clinic with any problems, questions or concerns. No barriers to learning was detected. The total time spent in the appointment was 30 minutes.     YTruitt Merle MD 11/02/2019   I, AJoslyn Devon am acting as scribe for YTruitt Merle MD.   I have reviewed the above documentation for accuracy and completeness, and I agree with the above.

## 2019-10-30 MED ORDER — OMEPRAZOLE 20 MG PO CPDR: 40.0000 mg | DELAYED_RELEASE_CAPSULE | Freq: Two times a day (BID) | ORAL | 2 refills | Status: DC

## 2019-10-30 MED ORDER — AMLODIPINE BESYLATE 5 MG PO TABS: 5.0000 mg | ORAL_TABLET | Freq: Every day | ORAL | 2 refills | Status: DC

## 2019-10-30 MED FILL — OMEPRAZOLE 20 MG CAP: 20 | 30 days supply | Qty: 60 | Fill #0

## 2019-10-30 MED FILL — AMLODIPINE BESYLATE 5 MG TA: 5 | 30 days supply | Qty: 30 | Fill #0

## 2019-11-01 ENCOUNTER — Other Ambulatory Visit: Payer: Self-pay | Admitting: Family Medicine

## 2019-11-01 ENCOUNTER — Other Ambulatory Visit: Payer: Self-pay

## 2019-11-01 DIAGNOSIS — C50919 Malignant neoplasm of unspecified site of unspecified female breast: Secondary | ICD-10-CM

## 2019-11-01 DIAGNOSIS — K219 Gastro-esophageal reflux disease without esophagitis: Secondary | ICD-10-CM

## 2019-11-02 ENCOUNTER — Inpatient Hospital Stay: Payer: Medicaid Other

## 2019-11-02 ENCOUNTER — Other Ambulatory Visit: Payer: Self-pay

## 2019-11-02 ENCOUNTER — Telehealth: Payer: Self-pay

## 2019-11-02 ENCOUNTER — Inpatient Hospital Stay: Payer: Medicaid Other | Attending: Hematology | Admitting: Hematology

## 2019-11-02 ENCOUNTER — Encounter: Payer: Self-pay | Admitting: Hematology

## 2019-11-02 VITALS — BP 147/86 | HR 76 | Temp 98.5°F | Resp 18 | Ht 64.0 in | Wt 140.9 lb

## 2019-11-02 DIAGNOSIS — C50919 Malignant neoplasm of unspecified site of unspecified female breast: Secondary | ICD-10-CM

## 2019-11-02 DIAGNOSIS — D649 Anemia, unspecified: Secondary | ICD-10-CM | POA: Diagnosis not present

## 2019-11-02 DIAGNOSIS — K2100 Gastro-esophageal reflux disease with esophagitis, without bleeding: Secondary | ICD-10-CM | POA: Insufficient documentation

## 2019-11-02 DIAGNOSIS — R160 Hepatomegaly, not elsewhere classified: Secondary | ICD-10-CM | POA: Insufficient documentation

## 2019-11-02 DIAGNOSIS — M79671 Pain in right foot: Secondary | ICD-10-CM | POA: Diagnosis not present

## 2019-11-02 DIAGNOSIS — G629 Polyneuropathy, unspecified: Secondary | ICD-10-CM | POA: Diagnosis not present

## 2019-11-02 DIAGNOSIS — R062 Wheezing: Secondary | ICD-10-CM | POA: Insufficient documentation

## 2019-11-02 DIAGNOSIS — R05 Cough: Secondary | ICD-10-CM | POA: Diagnosis not present

## 2019-11-02 DIAGNOSIS — T451X5A Adverse effect of antineoplastic and immunosuppressive drugs, initial encounter: Secondary | ICD-10-CM | POA: Diagnosis not present

## 2019-11-02 DIAGNOSIS — Z79899 Other long term (current) drug therapy: Secondary | ICD-10-CM | POA: Insufficient documentation

## 2019-11-02 DIAGNOSIS — R0982 Postnasal drip: Secondary | ICD-10-CM | POA: Insufficient documentation

## 2019-11-02 DIAGNOSIS — Z7901 Long term (current) use of anticoagulants: Secondary | ICD-10-CM | POA: Diagnosis not present

## 2019-11-02 DIAGNOSIS — Z7189 Other specified counseling: Secondary | ICD-10-CM

## 2019-11-02 DIAGNOSIS — Z170 Estrogen receptor positive status [ER+]: Secondary | ICD-10-CM | POA: Insufficient documentation

## 2019-11-02 DIAGNOSIS — I700 Atherosclerosis of aorta: Secondary | ICD-10-CM | POA: Insufficient documentation

## 2019-11-02 DIAGNOSIS — Z79811 Long term (current) use of aromatase inhibitors: Secondary | ICD-10-CM | POA: Insufficient documentation

## 2019-11-02 DIAGNOSIS — C50811 Malignant neoplasm of overlapping sites of right female breast: Secondary | ICD-10-CM | POA: Diagnosis present

## 2019-11-02 DIAGNOSIS — C787 Secondary malignant neoplasm of liver and intrahepatic bile duct: Secondary | ICD-10-CM | POA: Diagnosis not present

## 2019-11-02 DIAGNOSIS — Z5112 Encounter for antineoplastic immunotherapy: Secondary | ICD-10-CM | POA: Insufficient documentation

## 2019-11-02 DIAGNOSIS — D3501 Benign neoplasm of right adrenal gland: Secondary | ICD-10-CM | POA: Insufficient documentation

## 2019-11-02 DIAGNOSIS — I10 Essential (primary) hypertension: Secondary | ICD-10-CM | POA: Insufficient documentation

## 2019-11-02 DIAGNOSIS — Z95828 Presence of other vascular implants and grafts: Secondary | ICD-10-CM

## 2019-11-02 DIAGNOSIS — Z86718 Personal history of other venous thrombosis and embolism: Secondary | ICD-10-CM | POA: Insufficient documentation

## 2019-11-02 DIAGNOSIS — D6481 Anemia due to antineoplastic chemotherapy: Secondary | ICD-10-CM | POA: Diagnosis not present

## 2019-11-02 DIAGNOSIS — K8010 Calculus of gallbladder with chronic cholecystitis without obstruction: Secondary | ICD-10-CM | POA: Diagnosis not present

## 2019-11-02 DIAGNOSIS — E860 Dehydration: Secondary | ICD-10-CM

## 2019-11-02 DIAGNOSIS — R918 Other nonspecific abnormal finding of lung field: Secondary | ICD-10-CM | POA: Insufficient documentation

## 2019-11-02 DIAGNOSIS — R590 Localized enlarged lymph nodes: Secondary | ICD-10-CM | POA: Diagnosis not present

## 2019-11-02 LAB — CBC WITH DIFFERENTIAL (CANCER CENTER ONLY)
Abs Immature Granulocytes: 0.01 10*3/uL (ref 0.00–0.07)
Basophils Absolute: 0.1 10*3/uL (ref 0.0–0.1)
Basophils Relative: 1 %
Eosinophils Absolute: 1 10*3/uL — ABNORMAL HIGH (ref 0.0–0.5)
Eosinophils Relative: 14 %
HCT: 33.7 % — ABNORMAL LOW (ref 36.0–46.0)
Hemoglobin: 10.7 g/dL — ABNORMAL LOW (ref 12.0–15.0)
Immature Granulocytes: 0 %
Lymphocytes Relative: 31 %
Lymphs Abs: 2.2 10*3/uL (ref 0.7–4.0)
MCH: 28.8 pg (ref 26.0–34.0)
MCHC: 31.8 g/dL (ref 30.0–36.0)
MCV: 90.8 fL (ref 80.0–100.0)
Monocytes Absolute: 0.3 10*3/uL (ref 0.1–1.0)
Monocytes Relative: 4 %
Neutro Abs: 3.7 10*3/uL (ref 1.7–7.7)
Neutrophils Relative %: 50 %
Platelet Count: 219 10*3/uL (ref 150–400)
RBC: 3.71 MIL/uL — ABNORMAL LOW (ref 3.87–5.11)
RDW: 13.3 % (ref 11.5–15.5)
WBC Count: 7.3 10*3/uL (ref 4.0–10.5)
nRBC: 0 % (ref 0.0–0.2)

## 2019-11-02 LAB — CMP (CANCER CENTER ONLY)
ALT: 17 U/L (ref 0–44)
AST: 23 U/L (ref 15–41)
Albumin: 3.8 g/dL (ref 3.5–5.0)
Alkaline Phosphatase: 136 U/L — ABNORMAL HIGH (ref 38–126)
Anion gap: 11 (ref 5–15)
BUN: 17 mg/dL (ref 8–23)
CO2: 21 mmol/L — ABNORMAL LOW (ref 22–32)
Calcium: 9.1 mg/dL (ref 8.9–10.3)
Chloride: 107 mmol/L (ref 98–111)
Creatinine: 1.35 mg/dL — ABNORMAL HIGH (ref 0.44–1.00)
GFR, Est AFR Am: 48 mL/min — ABNORMAL LOW (ref >60)
GFR, Estimated: 42 mL/min — ABNORMAL LOW (ref >60)
Glucose, Bld: 106 mg/dL — ABNORMAL HIGH (ref 70–99)
Potassium: 4.1 mmol/L (ref 3.5–5.1)
Sodium: 139 mmol/L (ref 135–145)
Total Bilirubin: 0.4 mg/dL (ref 0.3–1.2)
Total Protein: 7.4 g/dL (ref 6.5–8.1)

## 2019-11-02 MED ORDER — PERTUZ-TRASTUZ-HYALURON-ZZXF 60-60-2000 MG-MG-U/ML CHEMO ~~LOC~~ SOLN
10.0000 mL | Freq: Once | SUBCUTANEOUS | Status: AC
  Administered 2019-11-02: 10 mL via SUBCUTANEOUS
  Filled 2019-11-02: qty 10

## 2019-11-02 MED ORDER — DEXILANT 60 MG PO CPDR: 60.0000 mg | DELAYED_RELEASE_CAPSULE | Freq: Every day | ORAL | 1 refills | Status: DC

## 2019-11-02 MED ORDER — SODIUM CHLORIDE 0.9% FLUSH
10.0000 mL | INTRAVENOUS | Status: DC | PRN
  Administered 2019-11-02: 10 mL
  Filled 2019-11-02: qty 10

## 2019-11-02 MED ORDER — DIPHENHYDRAMINE HCL 25 MG PO CAPS
ORAL_CAPSULE | ORAL | Status: AC
  Filled 2019-11-02: qty 1

## 2019-11-02 MED ORDER — DIPHENHYDRAMINE HCL 25 MG PO CAPS
25.0000 mg | ORAL_CAPSULE | Freq: Once | ORAL | Status: AC
  Administered 2019-11-02: 25 mg via ORAL

## 2019-11-02 MED ORDER — SODIUM CHLORIDE 0.9 % IV SOLN
INTRAVENOUS | Status: AC
  Filled 2019-11-02 (×2): qty 250

## 2019-11-02 MED ORDER — HEPARIN SOD (PORK) LOCK FLUSH 100 UNIT/ML IV SOLN
500.0000 [IU] | Freq: Once | INTRAVENOUS | Status: AC | PRN
  Administered 2019-11-02: 500 [IU]
  Filled 2019-11-02: qty 5

## 2019-11-02 MED ORDER — ACETAMINOPHEN 325 MG PO TABS
650.0000 mg | ORAL_TABLET | Freq: Once | ORAL | Status: AC
  Administered 2019-11-02: 650 mg via ORAL

## 2019-11-02 MED ORDER — ACETAMINOPHEN 325 MG PO TABS
ORAL_TABLET | ORAL | Status: AC
  Filled 2019-11-02: qty 2

## 2019-11-02 NOTE — Telephone Encounter (Signed)
-----   Message from Garner Nash, DO sent at 11/02/2019 10:37 AM EDT ----- Regarding: FW:  Can we get her an appt with app or me to discuss symptoms.  Thanks Brad ----- Message ----- From: [name], MD Sent: 11/02/2019  10:32 AM EDT To: Garner Nash, DO  [name],  You saw her in Nov 2020 for wheezing, you thought it's related to GERD. She is wheezing again, can not lay flat at night, could you see her back soon? I changed her PPI  [name]

## 2019-11-02 NOTE — Telephone Encounter (Signed)
Spoke with patient. I offered her an appt with one of the apps since the first appointment with BI would not be until April 29th. She stated that her daughter currently lives in [name] and takes her to all of her appts. She is due to have a MRI soon and would like to schedule an appt on the same day. The MRI has not been scheduled yet.   I advised her to call us once the MRI has been scheduled so that we can get her scheduled on the same day, she verbalized understanding.   Will route to BI so he is aware that she has been contacted.

## 2019-11-03 LAB — CANCER ANTIGEN 27.29: CA 27.29: 45.7 U/mL — ABNORMAL HIGH (ref 0.0–38.6)

## 2019-11-05 ENCOUNTER — Telehealth: Payer: Self-pay | Admitting: Hematology

## 2019-11-05 NOTE — Telephone Encounter (Signed)
Scheduled appt per 4/16 los.  Spoke with pt and she is aware of the appt date and time.

## 2019-11-11 MED FILL — SPIRONOLACTONE 25 MG TABS: 25 | 30 days supply | Qty: 30 | Fill #6

## 2019-11-12 ENCOUNTER — Other Ambulatory Visit: Payer: Self-pay

## 2019-11-12 ENCOUNTER — Other Ambulatory Visit: Payer: Self-pay | Admitting: Hematology

## 2019-11-12 ENCOUNTER — Telehealth: Payer: Self-pay

## 2019-11-12 DIAGNOSIS — C50919 Malignant neoplasm of unspecified site of unspecified female breast: Secondary | ICD-10-CM

## 2019-11-12 DIAGNOSIS — C787 Secondary malignant neoplasm of liver and intrahepatic bile duct: Secondary | ICD-10-CM

## 2019-11-12 MED ORDER — RIVAROXABAN 20 MG PO TABS: ORAL_TABLET | ORAL | 5 refills | Status: DC

## 2019-11-12 MED ORDER — HYDROCODONE-HOMATROPINE 5-1.5 MG/5ML PO SYRP: 5.0000 mL | ORAL_SOLUTION | Freq: Four times a day (QID) | ORAL | 0 refills | Status: DC | PRN

## 2019-11-12 MED FILL — HYDROCODONE-HOMATROPINE SOL: 5-1.5 | 6 days supply | Qty: 120 | Fill #0

## 2019-11-12 MED FILL — CARVEDILOL 3.125 MG TABLET: 3.125 | 90 days supply | Qty: 180 | Fill #3

## 2019-11-12 MED FILL — XARELTO 20 MG TABLET: 20 | 30 days supply | Qty: 30 | Fill #0

## 2019-11-12 MED FILL — GABAPENTIN 300 MG CAPSULE: 300 | 30 days supply | Qty: 150 | Fill #2

## 2019-11-12 NOTE — Telephone Encounter (Signed)
Teresa Hays called to day stating she continues with a cough that waxes and wanes she produces clear/white phlegm.  She denies fever and dyspnea.  She has tried antihistamines and is now taking mucinex.  She states the mucinex increase her coughing.  She is seeking advice.

## 2019-11-12 NOTE — Telephone Encounter (Signed)
Dr. Valeta Harms,  I believe I sent you a message about her wheezing and cough and you said you will see her. I do not see her f/u appointment with you. Could you get her in? Thanks much.  Santiago Glad, I will call in cough medicine hycodan for her, and let her know Dr. Valeta Harms will see her back. Thanks   [name] MD

## 2019-11-13 NOTE — Telephone Encounter (Signed)
Please schedule for follow up with me or APP. Thanks Garner Nash, DO [facility] 11/13/2019 4:51 PM

## 2019-11-13 NOTE — Telephone Encounter (Signed)
Spoke with the pt and scheduled appt with [name] at 11:30 11/23/19

## 2019-11-13 NOTE — Telephone Encounter (Signed)
I am not sure what happened. I will forward again to the office.  Thanks Garner Nash, DO [facility] [facility] 11/13/2019 4:50 PM

## 2019-11-13 NOTE — Telephone Encounter (Signed)
I spoke with Teresa Behner and let her know Dr. Burr Medico reached out to Dr. Valeta Harms to evaluate Teresa Hays. I let her know Dr. Burr Medico sent Hycodan rx to pharmacy for her cough.  I told her it may make her drowsy and not to drive after taking it.  She verbalized understanding

## 2019-11-14 ENCOUNTER — Inpatient Hospital Stay: Payer: Medicaid Other | Admitting: Hematology

## 2019-11-15 NOTE — Progress Notes (Signed)
[place]   Telephone:([phone]) [phone] Fax:(336) [phone]   Clinic Follow up Note   Patient Care Team: [name], DO as PCP - General (Family Medicine) [name], MD as Consulting Physician (Gastroenterology) [name], MD as Consulting Physician (Hematology)   I connected with [name] on 11/19/2019 at  2:40 PM EDT by telephone visit and verified that I am speaking with the correct person using two identifiers.  I discussed the limitations, risks, security and privacy concerns of performing an evaluation and management service by telephone and the availability of in person appointments. I also discussed with the patient that there may be a patient responsible charge related to this service. The patient expressed understanding and agreed to proceed.   Other persons participating in the visit and their role in the encounter:  None  Patient's location:  Her home  Provider's location:  My office   CHIEF COMPLAINT: F/u for metastatic breast cancer  SUMMARY OF ONCOLOGIC HISTORY: Oncology History Overview Note  Cancer Staging Metastatic breast cancer [facility]) Staging form: Breast, AJCC 8th Edition - Clinical stage from 03/24/2018: Stage IV (cT2, cN1, pM1, G3, ER+, PR+, HER2+) - Signed by [name], MD on 03/30/2018     Metastatic breast cancer ([place])  01/30/2018 Procedure   Colonoscopy showed small polyp in the sigmoid colon, removed, the exam of colon including the terminal ileum was otherwise negative.   01/30/2018 Procedure   EGD by Dr. Collene Mares showed small hiatal hernia, a 8 mm polypoid lesion in the cardia, biopsied.   03/09/2018 Imaging   03/09/2018 US Abdomen IMPRESSION: 1. Mass lesions throughout the liver, consistent with metastatic disease. Liver as a somewhat nodular contour suggesting underlying hepatic cirrhosis. Inhomogeneous echotexture to the liver.  2. Cholelithiasis with mild gallbladder wall thickening. A degree of cholecystitis cannot  be excluded by ultrasound.  3. Portions of pancreas obscured by gas. Visualized portions of pancreas appear normal.  4. Small right kidney. Etiology uncertain. This finding potentially may be indicative of renal artery stenosis. In this regard, question whether patient is hypertensive.   03/15/2018 Imaging   CT CAP with contrast  IMPRESSION: 1. Widespread hepatic metastasis. 2. 2.6 cm lateral right breast soft tissue nodule could represent a breast primary or an incidental benign lesion. Consider correlation with mammogram and ultrasound. 3. No definite source of primary malignancy identified within the abdomen or pelvis. There is possible rectosigmoid junction wall thickening. Consider colonoscopy with attention to this area. 4. Distal esophageal wall thickening, suggesting esophagitis.   03/24/2018 Cancer Staging   Staging form: Breast, AJCC 8th Edition - Clinical stage from 03/24/2018: Stage IV (cT2, cN1, pM1, G3, ER+, PR+, HER2+) - Signed by [name], MD on 03/30/2018   03/24/2018 Initial Biopsy   Diagnosis 1. 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 Breast, right, needle core biopsy, 9 o'clock, 7cm from nipple - INVASIVE DUCTAL CARCINOMA. -The carcinoma is somewhat morphologically dissimilar from that in part 1. It appears grade III 3. Lymph node, needle/core biopsy, right axillary - METASTATIC CARCINOMA IN 1 OF 1 LYMPH NODE (1/1).   03/24/2018 Receptors her2   Breast biopsy: 1. Estrogen Receptor: 40%, POSITIVE, STRONG-MODERATE STAINING INTENSITY Progesterone Receptor: 70%, POSITIVE, STRONG STAINING INTENSITY Proliferation Marker Ki67: 20% HER 2 equivocal by IHC 2+, POSITIVE by FISH, ratio 2.4 and copy #4.2  2. Estrogen Receptor: 60%, POSITIVE, MODERATE STAINING INTENSITY Progesterone Receptor: 40%, POSITIVE, MODERATE  STAINING INTENSITY Proliferation Marker Ki67: 20% HER2 (+) by IHC 3+    03/24/2018 Initial Diagnosis   Metastatic breast cancer ([place])   03/27/2018 Pathology Results   Diagnosis Liver, needle/core biopsy, Right - METASTATIC CARCINOMA TO LIVER, CONSISTENT WITH PATIENTS CLINICAL HISTORY OF PRIMARY BREAST CARCINOMA.  ER 80%+ PR40%+ HER2- (by [facility], IHC 2+)  Ki67 50%    03/28/2018 Pathology Results   03/28/2018 Surgical Pathology Diagnosis 1. Breast, left, needle core biopsy, 9 o'clock - FIBROCYSTIC CHANGES. - THERE IS NO EVIDENCE OF MALIGNANCY. 2. Breast, left, needle core biopsy, 2 o'clock - FIBROADENOMA. - THERE IS NO EVIDENCE OF MALIGNANCY. - SEE COMMENT.   03/29/2018 Imaging   03/29/2018 Bone Scan IMPRESSION: No scintigraphic evidence of osseous metastatic disease.   03/30/2018 Imaging   Bone scan  IMPRESSION: No scintigraphic evidence of osseous metastatic disease.    04/07/2018 -  Chemotherapy   First line chemo weekly Taxol and herceptin/Perjeta every 3 weeks starting 04/07/18. She developed infusion reaction to taxol and it was discontinued. Added Abraxane on C1D8, 2 weeks on/1 week off.  Abraxne stopped after 10/13/18 due to worsening Neuropathy. She has continued with maintenance Herceptin injection/Perjeta q3weeks. Switched to Herceptin injections on 09/21/19.    06/06/2018 Imaging   CT CAP IMPRESSION: 1. Generally improved appearance, with reduced axillary adenopathy and reduced enhancing component of the hepatic masses, with some of the hepatic mass is moderately smaller than on the prior exam. Reduced size of the right lateral breast mass compared to the prior 03/15/2018 exam. 2. New mild interstitial accentuation in the lungs, significance uncertain. Part of this appearance may be due to lower lung volumes on today's exam. 3. Mild wall thickening in the descending colon and upper rectum suggesting low-grade colitis/inflammation. Prominent stool throughout the colon favors constipation. 4. Other imaging findings of potential clinical  significance: Aortic Atherosclerosis (ICD10-I70.0). Mild cardiomegaly. Mild nodularity in the right lower lobe appears stable. Contracted and thick-walled gallbladder.    09/18/2018 Imaging   CT CAP W Contrast 09/18/18  IMPRESSION: 1. Liver metastases have decreased in size. 2. Right breast mass, mild right axillary lymphadenopathy and scattered tiny right pulmonary nodules are all stable. 3. New mild left supraclavicular and left subpectoral lymphadenopathy, can not exclude progression of metastatic nodal disease. 4. Moderate colorectal stool volume, which may indicate constipation. 5.  Aortic Atherosclerosis (ICD10-I70.0).   10/2018 -  Anti-estrogen oral therapy   Letrozole 2.5 mg daily starting 10/2018   01/10/2019 Imaging   CT CAP W Contrast 01/10/19  IMPRESSION: 1. Continued improvement in the hepatic metastatic lesions which have reduced in size. 2. Stable mild left supraclavicular and subpectoral adenopathy. 3. Essentially stable small right lower lobe pulmonary nodule and separate small subpleural nodule along the right hemidiaphragm. Surveillance suggested. 4. Other imaging findings of potential clinical significance: Mild cardiomegaly. Mild circumferential distal esophageal wall thickening, the most common cause would be esophagitis. Airway thickening is present, suggesting bronchitis or reactive airways disease. Airway plugging in the lower lobes and in the right middle lobe. Stable small right adrenal adenoma.   05/15/2019 Imaging   CT CAP W Contrast 05/15/19  IMPRESSION: CT CHEST IMPRESSION   1. Similar appearance of borderline supraclavicular, axillary, and subpectoral adenopathy. 2. Improved and resolved right lower lobe pulmonary nodularity. 3. Esophageal air fluid level suggests dysmotility or gastroesophageal reflux.   CT ABDOMEN AND PELVIS IMPRESSION   1. Improved hepatic metastasis. 2. Small bowel mesenteric lymph nodes which are upper normal and mildly  enlarged. Likely increased and  similar as detailed above. Indeterminate. Recommend attention on follow-up. 3. Cholelithiasis. 4. Motion degradation throughout the lower chest and abdomen.   09/19/2019 Imaging   CT CAP W contrast  IMPRESSION: 1. Interval decrease in size of right axillary and subpectoral lymph nodes. There are no pathologically enlarged lymph nodes remaining in the chest, abdomen, or pelvis. No new lymphadenopathy. 2. No significant change in post treatment appearance of multiple low-attenuation liver lesions, in keeping with treated metastases. 3. No evidence of new metastatic disease in the chest, abdomen, or pelvis. 4. Aortic Atherosclerosis (ICD10-I70.0).   11/16/2019 Imaging   IMRI Brain  MPRESSION: Minimal chronic microvascular ischemic changes. No acute intracranial process.   Active bilateral maxillary sinus disease with mild frontoethmoid mucosal thickening.      CURRENT THERAPY:  -First line weekly TaxolwithHerceptin/Perjeta every 3 weeks starting 04/07/18. She developed infusion reaction to taxol and it was discontinued. Changed to Abraxaneweekly2 weeks on/1 week offfrom 07/21/2018.Abraxane held after October 13, 2018 due to his worsening neuropathy.She will continue Herceptin injection/Perjeta q3weeksmaintenance therapy.Herceptin switched to injection on 09/21/19. -Letrozole 2.5 mg daily starting 10/2018   INTERVAL HISTORY:  BRADEE COMMON is here for a follow up. She notes she is stable. She notes her cough has improved but her throat congestion is the same. She notes her insurance company denied her [place].    REVIEW OF SYSTEMS:   Constitutional: Denies fevers, chills or abnormal weight loss Eyes: Denies blurriness of vision Ears, nose, mouth, throat, and face: Denies mucositis or sore throat Respiratory: Denies cough, dyspnea or wheezes Cardiovascular: Denies palpitation, chest discomfort or lower extremity swelling Gastrointestinal:   Denies nausea, heartburn or change in bowel habits Skin: Denies abnormal skin rashes Lymphatics: Denies new lymphadenopathy or easy bruising Neurological:Denies numbness, tingling or new weaknesses Behavioral/Psych: Mood is stable, no new changes  All other systems were reviewed with the patient and are negative.  MEDICAL HISTORY:  Past Medical History:  Diagnosis Date  . GERD (gastroesophageal reflux disease)   . Hypertension   . rt breast ca with mets to liver dx'd 03/2018    SURGICAL HISTORY: Past Surgical History:  Procedure Laterality Date  . COLONOSCOPY    . ESOPHAGOGASTRODUODENOSCOPY ENDOSCOPY    . IR IMAGING GUIDED PORT INSERTION  04/04/2018    I have reviewed the social history and family history with the patient and they are unchanged from previous note.  ALLERGIES:  has No Known Allergies.  MEDICATIONS:  Current Outpatient Medications  Medication Sig Dispense Refill  . amLODipine (NORVASC) 5 MG tablet Take 1 tablet (5 mg total) by mouth daily. 30 tablet 2  . carvedilol (COREG) 3.125 MG tablet Take 1 tablet (3.125 mg total) by mouth 2 (two) times daily. 180 tablet 3  . dexlansoprazole (DEXILANT) 60 MG capsule Take 1 capsule (60 mg total) by mouth daily. 30 capsule 1  . diphenoxylate-atropine (LOMOTIL) 2.5-0.025 MG tablet Take 1-2 tablets by mouth 4 (four) times daily as needed for diarrhea or loose stools. 60 tablet 1  . DULoxetine (CYMBALTA) 20 MG capsule TAKE 1 CAPSULE BY MOUTH ONCE DAILY 30 capsule 1  . gabapentin (NEURONTIN) 300 MG capsule TAKE 1 CAPSULE BY MOUTH IN THE MORNING, 1 CAPSULE MIDDAY AND 3 CAPSULES AT BEDTIME 150 capsule 3  . HYDROcodone-homatropine (HYCODAN) 5-1.5 MG/5ML syrup Take 5 mLs by mouth every 6 (six) hours as needed for cough. 120 mL 0  . letrozole (FEMARA) 2.5 MG tablet TAKE 1 TABLET (2.5 MG TOTAL) BY MOUTH DAILY. 90 tablet 1  .  lidocaine-prilocaine (EMLA) cream Apply to affected area once 30 g 3  . losartan (COZAAR) 50 MG tablet Take 1  tablet (50 mg total) by mouth daily. 90 tablet 3  . omeprazole (PRILOSEC) 20 MG capsule TAKE 2 CAPSULES BY MOUTH TWO TIMES DAILY BEFORE A MEAL 60 capsule 2  . potassium chloride SA (KLOR-CON) 20 MEQ tablet TAKE 1 TABLET BY MOUTH ONCE DAILY 30 tablet 0  . rivaroxaban (XARELTO) 20 MG TABS tablet TAKE 1 TABLET BY MOUTH DAILY WITH SUPPER 30 tablet 5  . spironolactone (ALDACTONE) 25 MG tablet Take 1 tablet (25 mg total) by mouth daily. 30 tablet 6  . traMADol (ULTRAM) 50 MG tablet Take 1 tablet (50 mg total) by mouth every 6 (six) hours as needed for moderate pain or severe pain. 30 tablet 0   No current facility-administered medications for this visit.    PHYSICAL EXAMINATION: ECOG PERFORMANCE STATUS: 1 - Symptomatic but completely ambulatory  No vitals taken today, Exam not performed today   LABORATORY DATA:  I have reviewed the data as listed CBC Latest Ref Rng & Units 11/02/2019 09/19/2019 08/29/2019  WBC 4.0 - 10.5 K/uL 7.3 6.8 7.3  Hemoglobin 12.0 - 15.0 g/dL 10.7(L) 11.1(L) 11.4(L)  Hematocrit 36.0 - 46.0 % 33.7(L) 34.3(L) 34.6(L)  Platelets 150 - 400 K/uL 219 192 212     CMP Latest Ref Rng & Units 11/02/2019 09/19/2019 08/29/2019  Glucose 70 - 99 mg/dL 106(H) 91 96  BUN 8 - 23 mg/dL '17 21 12  '$ Creatinine 0.44 - 1.00 mg/dL 1.35(H) 1.27(H) 1.11(H)  Sodium 135 - 145 mmol/L 139 140 140  Potassium 3.5 - 5.1 mmol/L 4.1 4.3 4.4  Chloride 98 - 111 mmol/L 107 104 105  CO2 22 - 32 mmol/L 21(L) 27 25  Calcium 8.9 - 10.3 mg/dL 9.1 9.8 9.8  Total Protein 6.5 - 8.1 g/dL 7.4 7.8 8.0  Total Bilirubin 0.3 - 1.2 mg/dL 0.4 0.4 0.3  Alkaline Phos 38 - 126 U/L 136(H) 139(H) 160(H)  AST 15 - 41 U/L '23 23 22  '$ ALT 0 - 44 U/L '17 17 16      '$ RADIOGRAPHIC STUDIES: I have personally reviewed the radiological images as listed and agreed with the findings in the report. No results found.   ASSESSMENT & PLAN:  Teresa Hays is a 64 y.o. female with    1. Metastatic right breast cancer to  liver,[place], stage IV, ER+/PR+/HER2+, Liver mets ER+/PR+/HER2- -She was diagnosed in 03/2018.She presented with diffuse liver metastasis, tworight breast massesand right axillary adenopathy.Breast mass biopsy showed ER PR positive, but 1 was HER-2 positive, the other one was HER-2 negative. Liver met was HER2-.  -Given the metastatic disease, her cancer is not curable but still treatable. Shehas been treated withLetrozole daily andFirst-line Herceptin/Perjetaevery 3 weeks and weekly Taxol. Due to infusionreaction, taxol was switched to Abraxane.We stoppedAbraxaneafter cycle 10 due to worsening neuropathyand continued with Herceptin/Perjetaas maintenance therapy. -Her CT CAP from 3/3/21showed she is responding to treatment. I discussed given good response there is a chance she can proceed with right Lumpectomy and axillary LN dissection to reduce her tumor burden. She is open to this. If next PET scan 4 months later looks good will refer her to surgeon.  -Will continue current treatment as it is controlling her disease.We discussed that she still has several treatment options if she progresses on thisregimen.  -She notes recent mild posterior headaches and trouble with her memory and appears to have mild trouble with her world  findings at last visit. She also notes tightness and dryness of throat.  -I personally reviewed and discussed her MRI brain from 11/16/19 which was negative for malignancy but has maxillary sinus disease. I referred her to see Dr Valeta Harms. She will see him this week -Her next Herceptin injection and Perjeta is 5/7. Continue every 3 weeks  -Continue Letrozole  -f/u with NP Lacie in 3 weeks    2.Peripheral neuropathy, grade 2, Right Foot pain -Secondarytochemotherapy,mainly in her feet and tingling in her fingers.  -Ipreviouslystropped Abraxane on 10/13/18. -She is currently on Oral B12,Gabapentin'300mg'$  5 tabs dailyand Tramadol1 tab every 1-2 nightsand low  dose Cymbalta. -She will also continue OTC Calcium and Vit D. -She has tried PT twice and does not feel it helps as much as she does not feel like a fall risk. -Her main pain is in the heal and ball of her right foot. She has received steroid injections to right foot from podiatrist which has improved her pain. Her podiatrist feels her pain is related to her toes and will continue to f/u with him for more work up.  -She overall still has adequate functions and ambulation.   4.Left UEDVT, Right LE edema -she developed LUE DVT on 08/04/2018, edema has resolved after anticoagulation -Continue onXarelto '20mg'$  indefinitely, unless she has significant bleeding or other side effects. -Shepreviouslydeveloped LE edema, right significantly more than left.10/13/18 doppler was negative for DVT.  -She was previously treated with Lasix. Right LE edema resolved.  5. Anemia -She developed worseningwhen she was on Chemo, now she is off. -Continue monitoring, will consider blood transfusion if hemoglobin less than 8. -Mild and stable.  6Social support  -She lives alone, has her sister and niece in [place]. Her daughter lives in a few hours away -On Ativan PRN due to anxietyabout diagnosis and treatment  7. Goal of care discussion  -she is full code -She understands the goal of care is palliative, to prolong her life, and improve her quality of life.  8. Recurrentdyspnea and wheezing, Tightness of throat, Mild HAs -She has been seen by Dr. Valeta Harms who notes this is related to her post nasal drip and her Acid reflux can contribute. Her started her on Prilosec is taken 2 tabs BID and nasal spray.  -This has recurred again with more wheezing at night and mild yellowish phlegm with productive cough. She notes it has not been easy to swallow occasionally.  -She notes she has been having mild posterior HA's and effects on her memory. She also appears to have mild trouble in her speech at  last visit.  -I recommend she f/u with Dr Valeta Harms soon. I called in stronger PPI, Dexilant (11/02/19). This was denied by her insurance. I will f/u about this. She is out of Prilosec.  -Her Brain MRI from 11/16/19 was negative for malignancy but has active b/l maxillary sinus disease.  -She only feels congestion in her throat. Her cough has improved. I have referred her to be seen by Dr Valeta Harms. She will be seen this week.  -I also recommend she use Nasal Flonase and continue allergy medication 1-2 times a day.     Plan: -MRI Brain reviewed, negative for metastasis  -Lab, flush, Herceptin injection and Perjeta on 5/7, OK to cancel visit on 5/7 -lab, flush, f/u with NP Lacie and Herceptin injection/Perjeta 5/26   No problem-specific Assessment & Plan notes found for this encounter.   No orders of the defined types were placed in this encounter.  I discussed the assessment and treatment plan with the patient. The patient was provided an opportunity to ask questions and all were answered. The patient agreed with the plan and demonstrated an understanding of the instructions.  The patient was advised to call back or seek an in-person evaluation if the symptoms worsen or if the condition fails to improve as anticipated.  The total time spent in the appointment was 22 minutes.    [name], MD 11/19/2019   I, Joslyn Devon, am acting as scribe for [name], MD.   I have reviewed the above documentation for accuracy and completeness, and I agree with the above.

## 2019-11-16 ENCOUNTER — Other Ambulatory Visit: Payer: Self-pay

## 2019-11-16 ENCOUNTER — Ambulatory Visit (HOSPITAL_COMMUNITY)
Admission: RE | Admit: 2019-11-16 | Discharge: 2019-11-16 | Disposition: A | Discharge status: Home / Self Care | Payer: Medicaid Other | Source: Ambulatory Visit | Attending: Hematology | Admitting: Hematology

## 2019-11-16 DIAGNOSIS — C50919 Malignant neoplasm of unspecified site of unspecified female breast: Secondary | ICD-10-CM | POA: Insufficient documentation

## 2019-11-16 MED ORDER — GADOBUTROL 1 MMOL/ML IV SOLN
6.5000 mL | Freq: Once | INTRAVENOUS | Status: AC | PRN
  Administered 2019-11-16: 6.5 mL via INTRAVENOUS

## 2019-11-19 ENCOUNTER — Encounter: Payer: Self-pay | Admitting: Hematology

## 2019-11-19 ENCOUNTER — Inpatient Hospital Stay: Payer: Medicaid Other | Attending: Hematology | Admitting: Hematology

## 2019-11-19 DIAGNOSIS — G620 Drug-induced polyneuropathy: Secondary | ICD-10-CM | POA: Insufficient documentation

## 2019-11-19 DIAGNOSIS — R0609 Other forms of dyspnea: Secondary | ICD-10-CM | POA: Insufficient documentation

## 2019-11-19 DIAGNOSIS — Z8261 Family history of arthritis: Secondary | ICD-10-CM | POA: Insufficient documentation

## 2019-11-19 DIAGNOSIS — Z86718 Personal history of other venous thrombosis and embolism: Secondary | ICD-10-CM | POA: Insufficient documentation

## 2019-11-19 DIAGNOSIS — D649 Anemia, unspecified: Secondary | ICD-10-CM | POA: Insufficient documentation

## 2019-11-19 DIAGNOSIS — C50919 Malignant neoplasm of unspecified site of unspecified female breast: Secondary | ICD-10-CM

## 2019-11-19 DIAGNOSIS — Z79811 Long term (current) use of aromatase inhibitors: Secondary | ICD-10-CM | POA: Insufficient documentation

## 2019-11-19 DIAGNOSIS — C50811 Malignant neoplasm of overlapping sites of right female breast: Secondary | ICD-10-CM | POA: Insufficient documentation

## 2019-11-19 DIAGNOSIS — I6782 Cerebral ischemia: Secondary | ICD-10-CM | POA: Insufficient documentation

## 2019-11-19 DIAGNOSIS — K8020 Calculus of gallbladder without cholecystitis without obstruction: Secondary | ICD-10-CM | POA: Insufficient documentation

## 2019-11-19 DIAGNOSIS — R590 Localized enlarged lymph nodes: Secondary | ICD-10-CM | POA: Insufficient documentation

## 2019-11-19 DIAGNOSIS — M79671 Pain in right foot: Secondary | ICD-10-CM | POA: Insufficient documentation

## 2019-11-19 DIAGNOSIS — R252 Cramp and spasm: Secondary | ICD-10-CM | POA: Insufficient documentation

## 2019-11-19 DIAGNOSIS — R062 Wheezing: Secondary | ICD-10-CM | POA: Insufficient documentation

## 2019-11-19 DIAGNOSIS — Z79899 Other long term (current) drug therapy: Secondary | ICD-10-CM | POA: Insufficient documentation

## 2019-11-19 DIAGNOSIS — K449 Diaphragmatic hernia without obstruction or gangrene: Secondary | ICD-10-CM | POA: Insufficient documentation

## 2019-11-19 DIAGNOSIS — R0602 Shortness of breath: Secondary | ICD-10-CM | POA: Insufficient documentation

## 2019-11-19 DIAGNOSIS — Z833 Family history of diabetes mellitus: Secondary | ICD-10-CM | POA: Insufficient documentation

## 2019-11-19 DIAGNOSIS — R918 Other nonspecific abnormal finding of lung field: Secondary | ICD-10-CM | POA: Insufficient documentation

## 2019-11-19 DIAGNOSIS — T451X5A Adverse effect of antineoplastic and immunosuppressive drugs, initial encounter: Secondary | ICD-10-CM | POA: Insufficient documentation

## 2019-11-19 DIAGNOSIS — I700 Atherosclerosis of aorta: Secondary | ICD-10-CM | POA: Insufficient documentation

## 2019-11-19 DIAGNOSIS — Z7901 Long term (current) use of anticoagulants: Secondary | ICD-10-CM | POA: Insufficient documentation

## 2019-11-19 DIAGNOSIS — Z5111 Encounter for antineoplastic chemotherapy: Secondary | ICD-10-CM | POA: Insufficient documentation

## 2019-11-19 DIAGNOSIS — J320 Chronic maxillary sinusitis: Secondary | ICD-10-CM | POA: Insufficient documentation

## 2019-11-19 DIAGNOSIS — C787 Secondary malignant neoplasm of liver and intrahepatic bile duct: Secondary | ICD-10-CM | POA: Insufficient documentation

## 2019-11-19 DIAGNOSIS — K2100 Gastro-esophageal reflux disease with esophagitis, without bleeding: Secondary | ICD-10-CM | POA: Insufficient documentation

## 2019-11-19 DIAGNOSIS — Z803 Family history of malignant neoplasm of breast: Secondary | ICD-10-CM | POA: Insufficient documentation

## 2019-11-19 DIAGNOSIS — Z170 Estrogen receptor positive status [ER+]: Secondary | ICD-10-CM | POA: Insufficient documentation

## 2019-11-19 DIAGNOSIS — K5900 Constipation, unspecified: Secondary | ICD-10-CM | POA: Insufficient documentation

## 2019-11-19 NOTE — Progress Notes (Signed)
Pharmacist Chemotherapy Monitoring - Follow Up Assessment    I verify that I have reviewed each item in the below checklist:  . Regimen for the patient is scheduled for the appropriate day and plan matches scheduled date. Marland Kitchen Appropriate non-routine labs are ordered dependent on drug ordered. . If applicable, additional medications reviewed and ordered per protocol based on lifetime cumulative doses and/or treatment regimen.   Plan for follow-up and/or issues identified: No . I-vent associated with next due treatment: No . MD and/or nursing notified: No   Kennith Center, Pharm.D., CPP 11/19/2019@11 :44 AM

## 2019-11-20 ENCOUNTER — Telehealth: Payer: Self-pay | Admitting: Hematology

## 2019-11-20 NOTE — Telephone Encounter (Signed)
Adjusted pt appts and cancelled the md visit per 5/3 los.  Spoke with pt and she is aware of the changes

## 2019-11-21 ENCOUNTER — Encounter: Payer: Self-pay | Admitting: Hematology

## 2019-11-23 ENCOUNTER — Ambulatory Visit (INDEPENDENT_AMBULATORY_CARE_PROVIDER_SITE_OTHER): Payer: Medicaid Other | Admitting: Family Medicine

## 2019-11-23 ENCOUNTER — Encounter: Payer: Self-pay | Admitting: Pulmonary Disease

## 2019-11-23 ENCOUNTER — Inpatient Hospital Stay: Payer: Medicaid Other

## 2019-11-23 ENCOUNTER — Encounter: Payer: Self-pay | Admitting: Family Medicine

## 2019-11-23 ENCOUNTER — Ambulatory Visit (HOSPITAL_COMMUNITY)
Admission: RE | Admit: 2019-11-23 | Discharge: 2019-11-23 | Disposition: A | Discharge status: Home / Self Care | Payer: Medicaid Other | Source: Ambulatory Visit | Attending: Medical | Admitting: Medical

## 2019-11-23 ENCOUNTER — Encounter: Payer: Self-pay | Admitting: Podiatry

## 2019-11-23 ENCOUNTER — Other Ambulatory Visit: Payer: Self-pay

## 2019-11-23 ENCOUNTER — Ambulatory Visit (INDEPENDENT_AMBULATORY_CARE_PROVIDER_SITE_OTHER): Payer: Medicaid Other | Admitting: Podiatry

## 2019-11-23 ENCOUNTER — Ambulatory Visit (INDEPENDENT_AMBULATORY_CARE_PROVIDER_SITE_OTHER): Payer: Medicaid Other | Admitting: Pulmonary Disease

## 2019-11-23 ENCOUNTER — Inpatient Hospital Stay: Payer: Medicaid Other | Admitting: Hematology

## 2019-11-23 ENCOUNTER — Inpatient Hospital Stay (HOSPITAL_BASED_OUTPATIENT_CLINIC_OR_DEPARTMENT_OTHER): Payer: Medicaid Other | Admitting: Medical

## 2019-11-23 VITALS — BP 134/78 | HR 74 | Temp 98.3°F | Resp 18

## 2019-11-23 VITALS — BP 118/74 | HR 84 | Temp 97.4°F | Ht 64.0 in | Wt 137.4 lb

## 2019-11-23 VITALS — BP 177/99 | HR 104 | Resp 28

## 2019-11-23 VITALS — Temp 98.3°F

## 2019-11-23 DIAGNOSIS — K8020 Calculus of gallbladder without cholecystitis without obstruction: Secondary | ICD-10-CM | POA: Diagnosis not present

## 2019-11-23 DIAGNOSIS — R0602 Shortness of breath: Secondary | ICD-10-CM | POA: Insufficient documentation

## 2019-11-23 DIAGNOSIS — R590 Localized enlarged lymph nodes: Secondary | ICD-10-CM | POA: Diagnosis not present

## 2019-11-23 DIAGNOSIS — M7751 Other enthesopathy of right foot: Secondary | ICD-10-CM | POA: Diagnosis not present

## 2019-11-23 DIAGNOSIS — T451X5A Adverse effect of antineoplastic and immunosuppressive drugs, initial encounter: Secondary | ICD-10-CM | POA: Diagnosis not present

## 2019-11-23 DIAGNOSIS — C50811 Malignant neoplasm of overlapping sites of right female breast: Secondary | ICD-10-CM | POA: Diagnosis present

## 2019-11-23 DIAGNOSIS — R053 Chronic cough: Secondary | ICD-10-CM

## 2019-11-23 DIAGNOSIS — I700 Atherosclerosis of aorta: Secondary | ICD-10-CM | POA: Diagnosis not present

## 2019-11-23 DIAGNOSIS — Z5111 Encounter for antineoplastic chemotherapy: Secondary | ICD-10-CM | POA: Diagnosis not present

## 2019-11-23 DIAGNOSIS — R062 Wheezing: Secondary | ICD-10-CM | POA: Diagnosis not present

## 2019-11-23 DIAGNOSIS — R05 Cough: Secondary | ICD-10-CM | POA: Diagnosis not present

## 2019-11-23 DIAGNOSIS — R0609 Other forms of dyspnea: Secondary | ICD-10-CM | POA: Diagnosis not present

## 2019-11-23 DIAGNOSIS — Z7189 Other specified counseling: Secondary | ICD-10-CM

## 2019-11-23 DIAGNOSIS — Z170 Estrogen receptor positive status [ER+]: Secondary | ICD-10-CM | POA: Diagnosis not present

## 2019-11-23 DIAGNOSIS — Z79899 Other long term (current) drug therapy: Secondary | ICD-10-CM | POA: Diagnosis not present

## 2019-11-23 DIAGNOSIS — R252 Cramp and spasm: Secondary | ICD-10-CM | POA: Diagnosis not present

## 2019-11-23 DIAGNOSIS — M7741 Metatarsalgia, right foot: Secondary | ICD-10-CM

## 2019-11-23 DIAGNOSIS — K449 Diaphragmatic hernia without obstruction or gangrene: Secondary | ICD-10-CM | POA: Diagnosis not present

## 2019-11-23 DIAGNOSIS — K5900 Constipation, unspecified: Secondary | ICD-10-CM | POA: Diagnosis not present

## 2019-11-23 DIAGNOSIS — G620 Drug-induced polyneuropathy: Secondary | ICD-10-CM | POA: Diagnosis not present

## 2019-11-23 DIAGNOSIS — Z7901 Long term (current) use of anticoagulants: Secondary | ICD-10-CM | POA: Diagnosis not present

## 2019-11-23 DIAGNOSIS — K219 Gastro-esophageal reflux disease without esophagitis: Secondary | ICD-10-CM

## 2019-11-23 DIAGNOSIS — K2100 Gastro-esophageal reflux disease with esophagitis, without bleeding: Secondary | ICD-10-CM | POA: Diagnosis not present

## 2019-11-23 DIAGNOSIS — Z0000 Encounter for general adult medical examination without abnormal findings: Secondary | ICD-10-CM

## 2019-11-23 DIAGNOSIS — J320 Chronic maxillary sinusitis: Secondary | ICD-10-CM | POA: Diagnosis not present

## 2019-11-23 DIAGNOSIS — Z23 Encounter for immunization: Secondary | ICD-10-CM | POA: Diagnosis not present

## 2019-11-23 DIAGNOSIS — C50919 Malignant neoplasm of unspecified site of unspecified female breast: Secondary | ICD-10-CM

## 2019-11-23 DIAGNOSIS — M778 Other enthesopathies, not elsewhere classified: Secondary | ICD-10-CM

## 2019-11-23 DIAGNOSIS — Z79811 Long term (current) use of aromatase inhibitors: Secondary | ICD-10-CM | POA: Diagnosis not present

## 2019-11-23 DIAGNOSIS — I6782 Cerebral ischemia: Secondary | ICD-10-CM | POA: Diagnosis not present

## 2019-11-23 DIAGNOSIS — C787 Secondary malignant neoplasm of liver and intrahepatic bile duct: Secondary | ICD-10-CM | POA: Diagnosis not present

## 2019-11-23 DIAGNOSIS — Z95828 Presence of other vascular implants and grafts: Secondary | ICD-10-CM

## 2019-11-23 DIAGNOSIS — R918 Other nonspecific abnormal finding of lung field: Secondary | ICD-10-CM | POA: Diagnosis not present

## 2019-11-23 DIAGNOSIS — M79671 Pain in right foot: Secondary | ICD-10-CM | POA: Diagnosis not present

## 2019-11-23 DIAGNOSIS — R059 Cough, unspecified: Secondary | ICD-10-CM

## 2019-11-23 DIAGNOSIS — D649 Anemia, unspecified: Secondary | ICD-10-CM | POA: Diagnosis not present

## 2019-11-23 LAB — CMP (CANCER CENTER ONLY)
ALT: 18 U/L (ref 0–44)
AST: 25 U/L (ref 15–41)
Albumin: 4.1 g/dL (ref 3.5–5.0)
Alkaline Phosphatase: 171 U/L — ABNORMAL HIGH (ref 38–126)
Anion gap: 13 (ref 5–15)
BUN: 11 mg/dL (ref 8–23)
CO2: 23 mmol/L (ref 22–32)
Calcium: 9.9 mg/dL (ref 8.9–10.3)
Chloride: 105 mmol/L (ref 98–111)
Creatinine: 1.29 mg/dL — ABNORMAL HIGH (ref 0.44–1.00)
GFR, Est AFR Am: 51 mL/min — ABNORMAL LOW (ref >60)
GFR, Estimated: 44 mL/min — ABNORMAL LOW (ref >60)
Glucose, Bld: 111 mg/dL — ABNORMAL HIGH (ref 70–99)
Potassium: 4.2 mmol/L (ref 3.5–5.1)
Sodium: 141 mmol/L (ref 135–145)
Total Bilirubin: 0.4 mg/dL (ref 0.3–1.2)
Total Protein: 8.1 g/dL (ref 6.5–8.1)

## 2019-11-23 LAB — CBC WITH DIFFERENTIAL (CANCER CENTER ONLY)
Abs Immature Granulocytes: 0.02 10*3/uL (ref 0.00–0.07)
Basophils Absolute: 0.1 10*3/uL (ref 0.0–0.1)
Basophils Relative: 1 %
Eosinophils Absolute: 1.1 10*3/uL — ABNORMAL HIGH (ref 0.0–0.5)
Eosinophils Relative: 11 %
HCT: 37.4 % (ref 36.0–46.0)
Hemoglobin: 12.1 g/dL (ref 12.0–15.0)
Immature Granulocytes: 0 %
Lymphocytes Relative: 23 %
Lymphs Abs: 2.2 10*3/uL (ref 0.7–4.0)
MCH: 29 pg (ref 26.0–34.0)
MCHC: 32.4 g/dL (ref 30.0–36.0)
MCV: 89.7 fL (ref 80.0–100.0)
Monocytes Absolute: 0.5 10*3/uL (ref 0.1–1.0)
Monocytes Relative: 5 %
Neutro Abs: 5.6 10*3/uL (ref 1.7–7.7)
Neutrophils Relative %: 60 %
Platelet Count: 223 10*3/uL (ref 150–400)
RBC: 4.17 MIL/uL (ref 3.87–5.11)
RDW: 13.4 % (ref 11.5–15.5)
WBC Count: 9.3 10*3/uL (ref 4.0–10.5)
nRBC: 0 % (ref 0.0–0.2)

## 2019-11-23 MED ORDER — ALBUTEROL SULFATE HFA 108 (90 BASE) MCG/ACT IN AERS
INHALATION_SPRAY | RESPIRATORY_TRACT | Status: AC
  Filled 2019-11-23: qty 6.7

## 2019-11-23 MED ORDER — SODIUM CHLORIDE 0.9 % IV SOLN
Freq: Once | INTRAVENOUS | Status: DC
  Filled 2019-11-23: qty 250

## 2019-11-23 MED ORDER — HEPARIN SOD (PORK) LOCK FLUSH 100 UNIT/ML IV SOLN
500.0000 [IU] | Freq: Once | INTRAVENOUS | Status: AC | PRN
  Administered 2019-11-23: 500 [IU]
  Filled 2019-11-23: qty 5

## 2019-11-23 MED ORDER — DIPHENHYDRAMINE HCL 25 MG PO CAPS
ORAL_CAPSULE | ORAL | Status: AC
  Filled 2019-11-23: qty 1

## 2019-11-23 MED ORDER — DOXYCYCLINE HYCLATE 100 MG PO TABS: 100.0000 mg | ORAL_TABLET | Freq: Two times a day (BID) | ORAL | 0 refills | Status: DC

## 2019-11-23 MED ORDER — DIPHENHYDRAMINE HCL 25 MG PO CAPS
25.0000 mg | ORAL_CAPSULE | Freq: Once | ORAL | Status: AC
  Administered 2019-11-23: 25 mg via ORAL

## 2019-11-23 MED ORDER — ACETAMINOPHEN 325 MG PO TABS
ORAL_TABLET | ORAL | Status: AC
  Filled 2019-11-23: qty 2

## 2019-11-23 MED ORDER — ALBUTEROL SULFATE HFA 108 (90 BASE) MCG/ACT IN AERS
2.0000 | INHALATION_SPRAY | Freq: Once | RESPIRATORY_TRACT | Status: AC
  Administered 2019-11-23: 2 via RESPIRATORY_TRACT

## 2019-11-23 MED ORDER — PERTUZ-TRASTUZ-HYALURON-ZZXF 60-60-2000 MG-MG-U/ML CHEMO ~~LOC~~ SOLN
10.0000 mL | Freq: Once | SUBCUTANEOUS | Status: AC
  Administered 2019-11-23: 10 mL via SUBCUTANEOUS
  Filled 2019-11-23: qty 10

## 2019-11-23 MED ORDER — SODIUM CHLORIDE 0.9% FLUSH
10.0000 mL | INTRAVENOUS | Status: DC | PRN
  Administered 2019-11-23: 10 mL
  Filled 2019-11-23: qty 10

## 2019-11-23 MED ORDER — ACETAMINOPHEN 325 MG PO TABS
650.0000 mg | ORAL_TABLET | Freq: Once | ORAL | Status: AC
  Administered 2019-11-23: 650 mg via ORAL

## 2019-11-23 MED ORDER — BREZTRI AEROSPHERE 160-9-4.8 MCG/ACT IN AERO: 2.0000 | INHALATION_SPRAY | Freq: Two times a day (BID) | RESPIRATORY_TRACT | 0 refills | Status: DC

## 2019-11-23 MED ORDER — PREDNISONE 10 MG PO TABS: ORAL_TABLET | ORAL | 0 refills | Status: DC

## 2019-11-23 MED FILL — predniSONE 10 MG TABS: 10 | 8 days supply | Qty: 20 | Fill #0

## 2019-11-23 MED FILL — OMEPRAZOLE 20 MG CAP: 20 | 30 days supply | Qty: 60 | Fill #1

## 2019-11-23 MED FILL — DOXYCYCLINE HYCLATE 100 MG: 100 | 7 days supply | Qty: 14 | Fill #0

## 2019-11-23 NOTE — Assessment & Plan Note (Signed)
Keep follow-up with oncology 

## 2019-11-23 NOTE — Patient Instructions (Addendum)
You were seen today by Lauraine Rinne, NP  for:   1. Shortness of breath  - Pulmonary function test; Future - predniSONE (DELTASONE) 10 MG tablet; 4 tabs for 2 days, then 3 tabs for 2 days, 2 tabs for 2 days, then 1 tab for 2 days, then stop  Dispense: 20 tablet; Refill: 0 - doxycycline (VIBRA-TABS) 100 MG tablet; Take 1 tablet (100 mg total) by mouth 2 (two) times daily.  Dispense: 14 tablet; Refill: 0  Trial of Breztri >>> 2 puffs in the morning right when you wake up, rinse out your mouth after use, 12 hours later 2 puffs, rinse after use >>> Take this daily, no matter what >>> This is not a rescue inhaler   Only use your albuterol as a rescue medication to be used if you can't catch your breath by resting or doing a relaxed purse lip breathing pattern.  - The less you use it, the better it will work when you need it. - Ok to use up to 2 puffs  every 4 hours if you must but call for immediate appointment if use goes up over your usual need - Don't leave home without it !!  (think of it like the spare tire for your car)    2. Gastroesophageal reflux disease, unspecified whether esophagitis present  GERD management: >>>Avoid laying flat until 2 hours after meals >>>Elevate head of the bed including entire chest >>>Reduce size of meals and amount of fat, acid, spices, caffeine and sweets >>>If you are smoking, Please stop! >>>Decrease alcohol consumption >>>Work on maintaining a healthy weight with normal BMI   Schedule follow-up with gastroenterology Dr. Collene Mares due to your persistent and worsened acid reflux despite medications   3. Metastatic breast cancer [facility])  Keep follow-up with oncology  4. Cough  - predniSONE (DELTASONE) 10 MG tablet; 4 tabs for 2 days, then 3 tabs for 2 days, 2 tabs for 2 days, then 1 tab for 2 days, then stop  Dispense: 20 tablet; Refill: 0 - doxycycline (VIBRA-TABS) 100 MG tablet; Take 1 tablet (100 mg total) by mouth 2 (two) times daily.  Dispense: 14  tablet; Refill: 0  Can use AYR nasal saline gel for nasal dryness  Start nasal saline rinses daily Use distilled water Shake well Get bottle lukewarm like a baby bottle   We recommend today:  Orders Placed This Encounter  Procedures  . Pulmonary function test    Standing Status:   Future    Standing Expiration Date:   11/22/2020    Order Specific Question:   Where should this test be performed?    Answer:   [facility]    Order Specific Question:   Full PFT: includes the following: basic spirometry, spirometry pre & post bronchodilator, diffusion capacity (DLCO), lung volumes    Answer:   Full PFT   Orders Placed This Encounter  Procedures  . Pulmonary function test   Meds ordered this encounter  Medications  . predniSONE (DELTASONE) 10 MG tablet    Sig: 4 tabs for 2 days, then 3 tabs for 2 days, 2 tabs for 2 days, then 1 tab for 2 days, then stop    Dispense:  20 tablet    Refill:  0  . doxycycline (VIBRA-TABS) 100 MG tablet    Sig: Take 1 tablet (100 mg total) by mouth 2 (two) times daily.    Dispense:  14 tablet    Refill:  0  . Budeson-Glycopyrrol-Formoterol (BREZTRI AEROSPHERE)  160-9-4.8 MCG/ACT AERO    Sig: Inhale 2 puffs into the lungs 2 (two) times daily.    Dispense:  11.8 g    Refill:  0    Order Specific Question:   Lot Number?    Answer:   [id number]    Order Specific Question:   Expiration Date?    Answer:   03/17/2021    Order Specific Question:   Manufacturer?    Answer:   [employer] [[id number]]    Follow Up:    Return in about 10 days (around 12/03/2019), or if symptoms worsen or fail to improve, for Follow up with Dr. Valeta Harms, Follow up with [name] FNP-C.  Schedule PFT in 8 weeks or later  Please do your part to reduce the spread of COVID-19:      Reduce your risk of any infection  and COVID19 by using the similar precautions used for avoiding the common cold or flu:  Marland Kitchen Wash your hands often with soap and warm water for at least 20 seconds.   If soap and water are not readily available, use an alcohol-based hand sanitizer with at least 60% alcohol.  . If coughing or sneezing, cover your mouth and nose by coughing or sneezing into the elbow areas of your shirt or coat, into a tissue or into your sleeve (not your hands). Langley Gauss A MASK when in public  . Avoid shaking hands with others and consider head nods or verbal greetings only. . Avoid touching your eyes, nose, or mouth with unwashed hands.  . Avoid close contact with people who are sick. . Avoid places or events with large numbers of people in one location, like concerts or sporting events. . If you have some symptoms but not all symptoms, continue to monitor at home and seek medical attention if your symptoms worsen. . If you are having a medical emergency, call 911.   [place] / e-Visit: [email or web address]         [facility]: [facility]: [id number]                   [facility]: [id number]     It is flu season:   >>> Best ways to protect herself from the flu: Receive the yearly flu vaccine, practice good hand hygiene washing with soap and also using hand sanitizer when available, eat a nutritious meals, get adequate rest, hydrate appropriately   Please contact the office if your symptoms worsen or you have concerns that you are not improving.   Thank you for choosing [facility] for your healthcare, and for allowing [place] to partner with you on your healthcare journey. I am thankful to be able to provide care to you today.   [name] FNP-C

## 2019-11-23 NOTE — Progress Notes (Signed)
@Patient  ID: Teresa Hays, female    DOB: 22-Mar-1956, 64 y.o.   MRN: [id number]  Chief Complaint  Patient presents with  . Follow-up    Increased SOB for the past 2 days. Productive cough with green phlegm. Denies any fevers.    Referring provider: [name], DO  HPI:  64 year old female never smoker initially referred to our office in November/2021 for evaluation of cough  PMH: Metastatic breast cancer, allergic rhinitis, anemia Smoker/ Smoking History: Never smoker Maintenance: None Pt of: Dr. Valeta Harms  11/23/2019  - Visit   64 year old female never smoker followed in our office for cough.  Patient presenting to office today as a follow-up due to acute worsening shortness of breath over the last 2 to 3 months.  Shortness of breath as well as cough worsened over the last 2 days.  Patient was evaluated by oncology today and found to have wheezing on exam.  They contacted our office to ensure we had close follow-up with the patient.  Patient presenting to office today after completing oncology visit.  Chest x-ray performed oncology clear without any acute abnormalities  Patient reports that for the last 2 days she has had a worsening productive cough with green phlegm.  She denies any fevers.  She is a never smoker.  She admits that she typically has worsened seasonal allergies in the springtime.  Patient continues to have persistent daily breakthrough reflux despite taking a PPI twice daily.  Her insurance will not cover the [place].  She has not been evaluated by gastroenterology in some time.  She is currently established with Dr. Collene Mares.  Questionaires / Pulmonary Flowsheets:   ACT:  No flowsheet data found.  MMRC: mMRC Dyspnea Scale mMRC Score  11/23/2019 3    Epworth:  No flowsheet data found.  Tests:   05/15/2019 CT scan of the chest: CTA of the chest abdomen pelvis revealing stable adenopathy and hepatic lesions consistent with her metastatic breast  cancer. Lung parenchyma reviewed.  No obvious interstitial disease.  11/23/2019-chest x-ray-no acute abnormalities, mild chronic bronchitic changes  09/19/2019-CT chest-tiny stable benign nodule of right pulmonary apex measuring 3 mm, no pleural effusion or pneumothorax   FENO:  No results found for: NITRICOXIDE  PFT: No flowsheet data found.  WALK:  No flowsheet data found.  Imaging: DG Chest 2 View  Result Date: 11/23/2019 CLINICAL DATA:  Acute shortness of breath. Metastatic breast cancer. EXAM: CHEST - 2 VIEW COMPARISON:  Chest x-ray dated 05/29/2019 and chest CT dated 10/07/2019 FINDINGS: Power port in place with the tip in the superior vena cava just below the level of the carina in good position, unchanged. The heart size and pulmonary vascularity are normal. The lungs are clear except for mild chronic peribronchial thickening. No effusions. No pulmonary masses. No acute bone abnormality. IMPRESSION: No acute abnormalities. Mild chronic bronchitic changes. Electronically Signed   By: Lorriane Shire M.D.   On: 11/23/2019 09:33   MR Brain W Wo Contrast  Result Date: 11/17/2019 CLINICAL DATA:  Headache. EXAM: MRI HEAD WITHOUT AND WITH CONTRAST TECHNIQUE: Multiplanar, multiecho pulse sequences of the brain and surrounding structures were obtained without and with intravenous contrast. CONTRAST:  6.70mL GADAVIST GADOBUTROL 1 MMOL/ML IV SOLN COMPARISON:  10/25/2005 head CT. FINDINGS: Brain: Scattered T2/FLAIR hyperintense supratentorial white matter foci. No acute infarct or intracranial hemorrhage. No midline shift, ventriculomegaly or extra-axial fluid collection. No mass lesion. No abnormal enhancement. Cavum septum pellucidum et vergae. Vascular: Normal flow voids. Skull and upper  cervical spine: Normal marrow signal. Sinuses/Orbits: Normal orbits. Layering right greater than left maxillary sinus secretions. Mild right frontal and ethmoid sinus mucosal thickening. No mastoid effusion. Other:  None. IMPRESSION: Minimal chronic microvascular ischemic changes. No acute intracranial process. Active bilateral maxillary sinus disease with mild frontoethmoid mucosal thickening. Electronically Signed   By: Primitivo Gauze M.D.   On: 11/17/2019 12:14    Lab Results:  CBC    Component Value Date/Time   WBC 9.3 11/23/2019 0859   WBC 3.2 (L) 04/25/2018 2030   RBC 4.17 11/23/2019 0859   HGB 12.1 11/23/2019 0859   HCT 37.4 11/23/2019 0859   PLT 223 11/23/2019 0859   MCV 89.7 11/23/2019 0859   MCH 29.0 11/23/2019 0859   MCHC 32.4 11/23/2019 0859   RDW 13.4 11/23/2019 0859   LYMPHSABS 2.2 11/23/2019 0859   MONOABS 0.5 11/23/2019 0859   EOSABS 1.1 (H) 11/23/2019 0859   BASOSABS 0.1 11/23/2019 0859    BMET    Component Value Date/Time   NA 141 11/23/2019 0859   K 4.2 11/23/2019 0859   CL 105 11/23/2019 0859   CO2 23 11/23/2019 0859   GLUCOSE 111 (H) 11/23/2019 0859   BUN 11 11/23/2019 0859   CREATININE 1.29 (H) 11/23/2019 0859   CALCIUM 9.9 11/23/2019 0859   GFRNONAA 44 (L) 11/23/2019 0859   GFRAA 51 (L) 11/23/2019 0859    BNP    Component Value Date/Time   BNP 9.3 05/29/2019 1016    ProBNP No results found for: PROBNP  Specialty Problems      Pulmonary Problems   Shortness of breath      No Known Allergies  Immunization History  Administered Date(s) Administered  . Influenza,inj,Quad PF,6+ Mos 04/28/2018, 04/06/2019    Past Medical History:  Diagnosis Date  . GERD (gastroesophageal reflux disease)   . Hypertension   . rt breast ca with mets to liver dx'd 03/2018    Tobacco History: Social History   Tobacco Use  Smoking Status Never Smoker  Smokeless Tobacco Never Used   Counseling given: Yes  Continue to not smoke  Outpatient Encounter Medications as of 11/23/2019  Medication Sig  . albuterol (VENTOLIN HFA) 108 (90 Base) MCG/ACT inhaler Inhale 1-2 puffs into the lungs every 6 (six) hours as needed for wheezing or shortness of breath.  Marland Kitchen  amLODipine (NORVASC) 5 MG tablet Take 1 tablet (5 mg total) by mouth daily.  Marland Kitchen dexlansoprazole (DEXILANT) 60 MG capsule Take 1 capsule (60 mg total) by mouth daily.  . diphenoxylate-atropine (LOMOTIL) 2.5-0.025 MG tablet Take 1-2 tablets by mouth 4 (four) times daily as needed for diarrhea or loose stools.  . DULoxetine (CYMBALTA) 20 MG capsule TAKE 1 CAPSULE BY MOUTH ONCE DAILY  . gabapentin (NEURONTIN) 300 MG capsule TAKE 1 CAPSULE BY MOUTH IN THE MORNING, 1 CAPSULE MIDDAY AND 3 CAPSULES AT BEDTIME  . HYDROcodone-homatropine (HYCODAN) 5-1.5 MG/5ML syrup Take 5 mLs by mouth every 6 (six) hours as needed for cough.  . letrozole (FEMARA) 2.5 MG tablet TAKE 1 TABLET (2.5 MG TOTAL) BY MOUTH DAILY.  Marland Kitchen lidocaine-prilocaine (EMLA) cream Apply to affected area once  . omeprazole (PRILOSEC) 20 MG capsule TAKE 2 CAPSULES BY MOUTH TWO TIMES DAILY BEFORE A MEAL  . potassium chloride SA (KLOR-CON) 20 MEQ tablet TAKE 1 TABLET BY MOUTH ONCE DAILY  . rivaroxaban (XARELTO) 20 MG TABS tablet TAKE 1 TABLET BY MOUTH DAILY WITH SUPPER  . spironolactone (ALDACTONE) 25 MG tablet Take 1 tablet (25 mg  total) by mouth daily.  . traMADol (ULTRAM) 50 MG tablet Take 1 tablet (50 mg total) by mouth every 6 (six) hours as needed for moderate pain or severe pain.  . Budeson-Glycopyrrol-Formoterol (BREZTRI AEROSPHERE) 160-9-4.8 MCG/ACT AERO Inhale 2 puffs into the lungs 2 (two) times daily.  . carvedilol (COREG) 3.125 MG tablet Take 1 tablet (3.125 mg total) by mouth 2 (two) times daily.  Marland Kitchen doxycycline (VIBRA-TABS) 100 MG tablet Take 1 tablet (100 mg total) by mouth 2 (two) times daily.  Marland Kitchen losartan (COZAAR) 50 MG tablet Take 1 tablet (50 mg total) by mouth daily.  . predniSONE (DELTASONE) 10 MG tablet 4 tabs for 2 days, then 3 tabs for 2 days, 2 tabs for 2 days, then 1 tab for 2 days, then stop  . [EXPIRED] albuterol (VENTOLIN HFA) 108 (90 Base) MCG/ACT inhaler 2 puff   . [EXPIRED] pertuz-trastuz-hyaluron-zzxf (PHESGO)  [id number] MG-MG-U/10ML chemo SQ injection maintenance dose 10 mL   . [DISCONTINUED] 0.9 %  sodium chloride infusion   . [DISCONTINUED] sodium chloride flush (NS) 0.9 % injection 10 mL    No facility-administered encounter medications on file as of 11/23/2019.     Review of Systems  Review of Systems  Constitutional: Positive for fatigue. Negative for activity change and fever.  HENT: Positive for congestion. Negative for sinus pressure, sinus pain and sore throat.   Respiratory: Positive for cough, shortness of breath and wheezing.   Cardiovascular: Negative for chest pain and palpitations.  Gastrointestinal: Negative for diarrhea, nausea and vomiting.       Daily breakthrough reflux  Musculoskeletal: Negative for arthralgias.  Neurological: Negative for dizziness.  Psychiatric/Behavioral: Negative for sleep disturbance. The patient is not nervous/anxious.      Physical Exam  BP 118/74 (BP Location: Left Arm, Patient Position: Sitting, Cuff Size: Normal)   Pulse 84   Temp (!) 97.4 F (36.3 C) (Temporal)   Ht 5\' 4"  (1.626 m)   Wt 137 lb 6.4 oz (62.3 kg)   SpO2 100% Comment: on RA  BMI 23.58 kg/m   Wt Readings from Last 5 Encounters:  11/23/19 137 lb 6.4 oz (62.3 kg)  11/02/19 140 lb 14.4 oz (63.9 kg)  10/12/19 140 lb 12.8 oz (63.9 kg)  09/21/19 141 lb 14.4 oz (64.4 kg)  08/29/19 140 lb 3.2 oz (63.6 kg)    BMI Readings from Last 5 Encounters:  11/23/19 23.58 kg/m  11/02/19 24.19 kg/m  10/12/19 24.17 kg/m  09/21/19 24.36 kg/m  08/29/19 24.07 kg/m     Physical Exam Vitals and nursing note reviewed.  Constitutional:      General: She is not in acute distress.    Appearance: Normal appearance. She is normal weight.  HENT:     Head: Normocephalic and atraumatic.     Right Ear: External ear normal. There is no impacted cerumen.     Left Ear: External ear normal. There is no impacted cerumen.     Nose: Nasal tenderness and rhinorrhea present. No congestion.       Right Nostril: Epistaxis present.     Left Nostril: Epistaxis present.     Comments: Irritated nasal passages bilaterally, some bleeding     Mouth/Throat:     Mouth: Mucous membranes are moist.     Pharynx: Oropharynx is clear.  Eyes:     Pupils: Pupils are equal, round, and reactive to light.  Cardiovascular:     Rate and Rhythm: Normal rate and regular rhythm.     Pulses: Normal  pulses.     Heart sounds: Normal heart sounds. No murmur.  Pulmonary:     Breath sounds: No decreased air movement. Wheezing (Inspiratory and expiratory wheeze) present. No decreased breath sounds or rales.  Musculoskeletal:     Cervical back: Normal range of motion.  Skin:    General: Skin is warm and dry.     Capillary Refill: Capillary refill takes less than 2 seconds.  Neurological:     General: No focal deficit present.     Mental Status: She is alert and oriented to person, place, and time. Mental status is at baseline.     Gait: Gait normal.  Psychiatric:        Mood and Affect: Mood normal.        Behavior: Behavior normal.        Thought Content: Thought content normal.        Judgment: Judgment normal.       Assessment & Plan:   Gastroesophageal reflux disease This is a large component to the patient's problem Despite taking omeprazole 40 mg twice daily patient is still having daily breakthrough reflux Discussed today with patient that this can affect breathing, cause wheezing as well as the fact respiratory status  Plan: Encourage patient to reestablish with her gastroenterologist Dr. Collene Mares Continue omeprazole 40 mg twice daily Follow GERD lifestyle recommendations  Shortness of breath Suspect bronchitis versus asthma exacerbation Clear chest x-ray  Plan: Prednisone taper today Doxycycline today We will start empiric trial of Breztri inhaler >>> Likely long-term patient will need ICS/LABA, no samples in office to be able to provide for the patient 7 to 10-day follow-up  with our office PFTs ordered Encouraged to follow back up with gastroenterology  Metastatic breast cancer [facility])  Keep follow-up with oncology    Return in about 10 days (around 12/03/2019), or if symptoms worsen or fail to improve, for Follow up with Dr. Valeta Harms, Follow up with [name] FNP-C.   Lauraine Rinne, NP 11/23/2019   This appointment required 34 minutes of patient care (this includes precharting, chart review, review of results, face-to-face care, etc.).

## 2019-11-23 NOTE — Progress Notes (Signed)
Pt reports feeling moderate relief of SOB after inhaler administration.  PA Lucianne Lei aware.

## 2019-11-23 NOTE — Progress Notes (Addendum)
Sympt[facility] Management Clinic Progress Note   [name] 1955-08-02 64 y.o.  Teresa Hays is managed by Dr. [name]  Actively treated with chemotherapy/immunotherapy/hormonal therapy: yes  Current therapy: pertuz-trastuz-hyaluron-zzxf (Phesgo)  Last treated: 11/02/2019 (cycle #27)  Next scheduled appointment with provider: 12/12/2019  Assessment: Plan:    Shortness of breath - Plan: DG Chest 2 View, albuterol (VENTOLIN HFA) 108 (90 Base) MCG/ACT inhaler 2 puff  Metastatic breast cancer ([place])   Metastatic breast cancer.  [name] continues to be managed by Dr. [name].  She is receiving cycle #28 of pertuz-trastuz-hyaluron-zzxf (Phesgo) today.  She is scheduled to return for follow-up on 12/12/2019.  Shortness of breath: A chest x-ray was completed today which returned showing:  FINDINGS: Power port in place with the tip in the superior vena cava just below the level of the carina in good position, unchanged.  The heart size and pulmonary vascularity are normal.  The lungs are clear except for mild chronic peribronchial thickening.  No effusions.  No pulmonary masses.  No acute bone abnormality.  IMPRESSION: No acute abnormalities.  Mild chronic bronchitic changes.  Mr. Stutzman was given a treatment with an albuterol inhaler with improvement in her symptoms.  This case was discussed with pulmonology who will be seeing the patient later today.  Please see After Visit Summary for patient specific instructions.  Future Appointments  Date Time Provider [place]  11/23/2019  1:15 PM Felipa Furnace, DPM [facility]  11/23/2019  2:15 PM Stark Klein, MD [facility]  12/12/2019  7:45 AM [facility] LAB 5 [facility] None  12/12/2019  8:00 AM [facility] FLUSH [facility] None  12/12/2019  8:15 AM Alla Feeling, NP [facility] None  12/12/2019  9:00 AM [facility] INFUSION [facility] None  12/12/2019 11:00 AM [facility] ECHO  OP 1 [facility]  12/12/2019 12:00 PM Bensimhon, Shaune Pascal, MD [facility] None    Orders Placed This Encounter  Procedures  . DG Chest 2 View       Subjective:   Patient ID:  Teresa Hays is a 64 y.o. (DOB 1955-12-04) female.  Chief Complaint:  Chief Complaint  Patient presents with  . Shortness of Breath    HPI Teresa Hays is a 64 y.o. female with a diagnosis of a metastatic breast cancer.  Ms. Teresa Hays continues to be managed by Dr. [name].  She is receiving cycle #28 of pertuz-trastuz-hyaluron-zzxf (Phesgo) today.  She reports that she has had wheezing and increased shortness of breath over the last 2 days.  It is difficult to walk short distances without becoming short of breath and having to stop.  Dr. Burr Medico had referred the patient to pulmonology.  The patient is being seen today by pulmonology.  She last had an echocardiogram done on 08/29/2019 which returned showing an EF of 55 to 60%.  She denies chest pain, productive cough, fevers, chills, or sweats.  She has never been treated for any chronic pulmonary disease.  Medications: I have reviewed the patient's current medications.  Allergies: No Known Allergies  Past Medical History:  Diagnosis Date  . GERD (gastroesophageal reflux disease)   . Hypertension   . rt breast ca with mets to liver dx'd 03/2018    Past Surgical History:  Procedure Laterality Date  . COLONOSCOPY    . ESOPHAGOGASTRODUODENOSCOPY ENDOSCOPY    . IR IMAGING GUIDED PORT INSERTION  04/04/2018    Family History  Problem Relation Age of Onset  .  Diabetes Mother   . Cancer Sister 75       breast cancer  . Rheum arthritis Sister     Social History   Socioeconomic History  . Marital status: Single    Spouse name: Not on file  . Number of children: Not on file  . Years of education: Not on file  . Highest education level: Not on file  Occupational History  . Not on file  Tobacco Use  . Smoking status: Never Smoker  .  Smokeless tobacco: Never Used  Substance and Sexual Activity  . Alcohol use: No  . Drug use: Never  . Sexual activity: Not on file  Other Topics Concern  . Not on file  Social History Narrative  . Not on file   Social Determinants of Health   Financial Resource Strain:   . Difficulty of Paying Living Expenses:   Food Insecurity:   . Worried About Charity fundraiser in the Last Year:   . Arboriculturist in the Last Year:   Transportation Needs:   . Film/video editor (Medical):   Marland Kitchen Lack of Transportation (Non-Medical):   Physical Activity:   . Days of Exercise per Week:   . Minutes of Exercise per Session:   Stress:   . Feeling of Stress :   Social Connections:   . Frequency of Communication with Friends and Family:   . Frequency of Social Gatherings with Friends and Family:   . Attends Religious Services:   . Active Member of Clubs or Organizations:   . Attends Archivist Meetings:   Marland Kitchen Marital Status:   Intimate Partner Violence:   . Fear of Current or Ex-Partner:   . Emotionally Abused:   Marland Kitchen Physically Abused:   . Sexually Abused:     Past Medical History, Surgical history, Social history, and Family history were reviewed and updated as appropriate.   Please see review of systems for further details on the patient's review from today.   Review of Systems:  Review of Systems  Constitutional: Negative for chills, diaphoresis and fever.  HENT: Negative for trouble swallowing.   Respiratory: Positive for shortness of breath. Negative for cough, choking, chest tightness, wheezing and stridor.   Cardiovascular: Negative for chest pain and palpitations.    Objective:   Physical Exam:  Temp 98.3 F (36.8 C) (Oral)  ECOG: 1  Physical Exam Constitutional:      General: She is not in acute distress.    Appearance: She is not diaphoretic.  HENT:     Head: Normocephalic and atraumatic.  Cardiovascular:     Rate and Rhythm: Regular rhythm. Tachycardia  present.     Heart sounds: Normal heart sounds. No murmur. No friction rub. No gallop.   Pulmonary:     Effort: Pulmonary effort is normal. No respiratory distress.     Breath sounds: No stridor. Examination of the left-middle field reveals wheezing and rhonchi. Examination of the left-lower field reveals wheezing and rhonchi. Wheezing and rhonchi present. No rales.  Musculoskeletal:     Right lower leg: No edema.     Left lower leg: No edema.  Skin:    General: Skin is warm and dry.     Coloration: Skin is not pale.     Findings: No erythema.  Neurological:     Mental Status: She is alert.  Psychiatric:        Mood and Affect: Mood is anxious.  Thought Content: Thought content normal.        Judgment: Judgment normal.     Lab Review:     Component Value Date/Time   NA 141 11/23/2019 0859   K 4.2 11/23/2019 0859   CL 105 11/23/2019 0859   CO2 23 11/23/2019 0859   GLUCOSE 111 (H) 11/23/2019 0859   BUN 11 11/23/2019 0859   CREATININE 1.29 (H) 11/23/2019 0859   CALCIUM 9.9 11/23/2019 0859   PROT 8.1 11/23/2019 0859   ALBUMIN 4.1 11/23/2019 0859   AST 25 11/23/2019 0859   ALT 18 11/23/2019 0859   ALKPHOS 171 (H) 11/23/2019 0859   BILITOT 0.4 11/23/2019 0859   GFRNONAA 44 (L) 11/23/2019 0859   GFRAA 51 (L) 11/23/2019 0859       Component Value Date/Time   WBC 9.3 11/23/2019 0859   WBC 3.2 (L) 04/25/2018 2030   RBC 4.17 11/23/2019 0859   HGB 12.1 11/23/2019 0859   HCT 37.4 11/23/2019 0859   PLT 223 11/23/2019 0859   MCV 89.7 11/23/2019 0859   MCH 29.0 11/23/2019 0859   MCHC 32.4 11/23/2019 0859   RDW 13.4 11/23/2019 0859   LYMPHSABS 2.2 11/23/2019 0859   MONOABS 0.5 11/23/2019 0859   EOSABS 1.1 (H) 11/23/2019 0859   BASOSABS 0.1 11/23/2019 0859   -------------------------------  Imaging from last 24 hours (if applicable):  Radiology interpretation: DG Chest 2 View  Result Date: 11/23/2019 CLINICAL DATA:  Acute shortness of breath. Metastatic breast  cancer. EXAM: CHEST - 2 VIEW COMPARISON:  Chest x-ray dated 05/29/2019 and chest CT dated 10/07/2019 FINDINGS: Power port in place with the tip in the superior vena cava just below the level of the carina in good position, unchanged. The heart size and pulmonary vascularity are normal. The lungs are clear except for mild chronic peribronchial thickening. No effusions. No pulmonary masses. No acute bone abnormality. IMPRESSION: No acute abnormalities. Mild chronic bronchitic changes. Electronically Signed   By: Lorriane Shire M.D.   On: 11/23/2019 09:33   MR Brain W Wo Contrast  Result Date: 11/17/2019 CLINICAL DATA:  Headache. EXAM: MRI HEAD WITHOUT AND WITH CONTRAST TECHNIQUE: Multiplanar, multiecho pulse sequences of the brain and surrounding structures were obtained without and with intravenous contrast. CONTRAST:  6.21mL GADAVIST GADOBUTROL 1 MMOL/ML IV SOLN COMPARISON:  10/25/2005 head CT. FINDINGS: Brain: Scattered T2/FLAIR hyperintense supratentorial white matter foci. No acute infarct or intracranial hemorrhage. No midline shift, ventriculomegaly or extra-axial fluid collection. No mass lesion. No abnormal enhancement. Cavum septum pellucidum et vergae. Vascular: Normal flow voids. Skull and upper cervical spine: Normal marrow signal. Sinuses/Orbits: Normal orbits. Layering right greater than left maxillary sinus secretions. Mild right frontal and ethmoid sinus mucosal thickening. No mastoid effusion. Other: None. IMPRESSION: Minimal chronic microvascular ischemic changes. No acute intracranial process. Active bilateral maxillary sinus disease with mild frontoethmoid mucosal thickening. Electronically Signed   By: Primitivo Gauze M.D.   On: 11/17/2019 12:14        This patient was seen with Dr. Burr Medico with my treatment plan reviewed with her. She expressed agreement with my medical management of this patient.   Addendum  Pt was seen at [facility] for worsening dyspnea on exertion.  This has been  progressively getting worse in the past 2 days, to the point she is not able to walk for more than a hundred feet.  Physical exam showed a bilateral wheezing throughout her lungs, no hypoxia.  X-ray was unremarkable.  This is likely COPD exacerbation, will  give her albuterol inhaler which improved her symptoms.  She has appointment at the pulmonary clinic today, I spoke with PA [name], he was see patient today, and try to manage as outpatient, with close follow-up. Will continue her breast cancer treatment.   [name]  11/23/2019

## 2019-11-23 NOTE — Patient Instructions (Addendum)
It was a pleasure to see you today! Thank you for choosing [facility] for your primary care. Teresa Hays was seen for follow up for cough and congestion.   Our plans for today were:  Please continue using the inhaler, prednisone and antibiotic scribed by her pulmonologist in follow-up on 5/17.  Please follow-up with your gastroenterologist, Dr. Collene Mares for your gastric reflux.  Please continue to take your Protonix.  Good job on keeping up with your health maintenance items!  1. You have completed your for 2 doses of your Covid vaccination. 2. Colonoscopy was completed in 2019, I will review these results and recommended she follow-up as recommended. 3. Please call to schedule your Pap smear.   You should return to our clinic to see your primary care physician for Pap smear at your earliest convenience.  We have agreed you will call to schedule this appointment.  Best wishes & health,  Dr. Rosita Fire

## 2019-11-23 NOTE — Progress Notes (Signed)
Thank you.  Discussed in office.[name], FNP

## 2019-11-23 NOTE — Assessment & Plan Note (Addendum)
Suspect bronchitis versus asthma exacerbation Clear chest x-ray  Plan: Prednisone taper today Doxycycline today We will start empiric trial of Breztri inhaler >>> Likely long-term patient will need ICS/LABA, no samples in office to be able to provide for the patient 7 to 10-day follow-up with our office PFTs ordered Encouraged to follow back up with gastroenterology

## 2019-11-23 NOTE — Patient Instructions (Signed)

## 2019-11-23 NOTE — Patient Instructions (Signed)
[place] Discharge Instructions for Patients Receiving Chemotherapy  Today you received the following chemotherapy agent: PHESGO (trastuzumab/pertuzumab injection)  To help prevent nausea and vomiting after your treatment, we encourage you to take your nausea medication as prescribed.   If you develop nausea and vomiting that is not controlled by your nausea medication, call the clinic.   BELOW ARE SYMPTOMS THAT SHOULD BE REPORTED IMMEDIATELY:  *FEVER GREATER THAN 100.5 F  *CHILLS WITH OR WITHOUT FEVER  NAUSEA AND VOMITING THAT IS NOT CONTROLLED WITH YOUR NAUSEA MEDICATION  *UNUSUAL SHORTNESS OF BREATH  *UNUSUAL BRUISING OR BLEEDING  TENDERNESS IN MOUTH AND THROAT WITH OR WITHOUT PRESENCE OF ULCERS  *URINARY PROBLEMS  *BOWEL PROBLEMS  UNUSUAL RASH Items with * indicate a potential emergency and should be followed up as soon as possible.  Feel free to call the clinic should you have any questions or concerns. The clinic phone number is [phone].  Please show the [place] at check-in to the Emergency Department and triage nurse.      Pertuzumab injection What is this medicine? PERTUZUMAB (per TOOZ ue mab) is a monoclonal antibody. It is used to treat breast cancer. This medicine may be used for other purposes; ask your health care provider or pharmacist if you have questions. COMMON BRAND NAME(S): PERJETA What should I tell my health care provider before I take this medicine? They need to know if you have any of these conditions:  heart disease  heart failure  high blood pressure  history of irregular heart beat  recent or ongoing radiation therapy  an unusual or allergic reaction to pertuzumab, other medicines, foods, dyes, or preservatives  pregnant or trying to get pregnant  breast-feeding How should I use this medicine? This medicine is for infusion into a vein. It is given by a health care professional in a hospital or  clinic setting. Talk to your pediatrician regarding the use of this medicine in children. Special care may be needed. Overdosage: If you think you have taken too much of this medicine contact a poison control center or emergency room at once. NOTE: This medicine is only for you. Do not share this medicine with others. What if I miss a dose? It is important not to miss your dose. Call your doctor or health care professional if you are unable to keep an appointment. What may interact with this medicine? Interactions are not expected. Give your health care provider a list of all the medicines, herbs, non-prescription drugs, or dietary supplements you use. Also tell them if you smoke, drink alcohol, or use illegal drugs. Some items may interact with your medicine. This list may not describe all possible interactions. Give your health care provider a list of all the medicines, herbs, non-prescription drugs, or dietary supplements you use. Also tell them if you smoke, drink alcohol, or use illegal drugs. Some items may interact with your medicine. What should I watch for while using this medicine? Your condition will be monitored carefully while you are receiving this medicine. Report any side effects. Continue your course of treatment even though you feel ill unless your doctor tells you to stop. Do not become pregnant while taking this medicine or for 7 months after stopping it. Women should inform their doctor if they wish to become pregnant or think they might be pregnant. Women of child-bearing potential will need to have a negative pregnancy test before starting this medicine. There is a potential for serious side effects  to an unborn child. Talk to your health care professional or pharmacist for more information. Do not breast-feed an infant while taking this medicine or for 7 months after stopping it. Women must use effective birth control with this medicine. Call your doctor or health care  professional for advice if you get a fever, chills or sore throat, or other symptoms of a cold or flu. Do not treat yourself. Try to avoid being around people who are sick. You may experience fever, chills, and headache during the infusion. Report any side effects during the infusion to your health care professional. What side effects may I notice from receiving this medicine? Side effects that you should report to your doctor or health care professional as soon as possible:  breathing problems  chest pain or palpitations  dizziness  feeling faint or lightheaded  fever or chills  skin rash, itching or hives  sore throat  swelling of the face, lips, or tongue  swelling of the legs or ankles  unusually weak or tired Side effects that usually do not require medical attention (report to your doctor or health care professional if they continue or are bothersome):  diarrhea  hair loss  nausea, vomiting  tiredness This list may not describe all possible side effects. Call your doctor for medical advice about side effects. You may report side effects to FDA at 1-800-FDA-1088. Where should I keep my medicine? This drug is given in a hospital or clinic and will not be stored at home. NOTE: This sheet is a summary. It may not cover all possible information. If you have questions about this medicine, talk to your doctor, pharmacist, or health care provider.  2020 Elsevier/Gold Standard (2015-08-07 12:08:50)   Trastuzumab injection for infusion What is this medicine? TRASTUZUMAB (tras TOO zoo mab) is a monoclonal antibody. It is used to treat breast cancer and stomach cancer. This medicine may be used for other purposes; ask your health care provider or pharmacist if you have questions. COMMON BRAND NAME(S): Herceptin, Galvin Proffer, Trazimera What should I tell my health care provider before I take this medicine? They need to know if you have any of these  conditions:  heart disease  heart failure  lung or breathing disease, like asthma  an unusual or allergic reaction to trastuzumab, benzyl alcohol, or other medications, foods, dyes, or preservatives  pregnant or trying to get pregnant  breast-feeding How should I use this medicine? This drug is given as an infusion into a vein. It is administered in a hospital or clinic by a specially trained health care professional. Talk to your pediatrician regarding the use of this medicine in children. This medicine is not approved for use in children. Overdosage: If you think you have taken too much of this medicine contact a poison control center or emergency room at once. NOTE: This medicine is only for you. Do not share this medicine with others. What if I miss a dose? It is important not to miss a dose. Call your doctor or health care professional if you are unable to keep an appointment. What may interact with this medicine? This medicine may interact with the following medications:  certain types of chemotherapy, such as daunorubicin, doxorubicin, epirubicin, and idarubicin This list may not describe all possible interactions. Give your health care provider a list of all the medicines, herbs, non-prescription drugs, or dietary supplements you use. Also tell them if you smoke, drink alcohol, or use illegal drugs. Some items may  interact with your medicine. What should I watch for while using this medicine? Visit your doctor for checks on your progress. Report any side effects. Continue your course of treatment even though you feel ill unless your doctor tells you to stop. Call your doctor or health care professional for advice if you get a fever, chills or sore throat, or other symptoms of a cold or flu. Do not treat yourself. Try to avoid being around people who are sick. You may experience fever, chills and shaking during your first infusion. These effects are usually mild and can be treated  with other medicines. Report any side effects during the infusion to your health care professional. Fever and chills usually do not happen with later infusions. Do not become pregnant while taking this medicine or for 7 months after stopping it. Women should inform their doctor if they wish to become pregnant or think they might be pregnant. Women of child-bearing potential will need to have a negative pregnancy test before starting this medicine. There is a potential for serious side effects to an unborn child. Talk to your health care professional or pharmacist for more information. Do not breast-feed an infant while taking this medicine or for 7 months after stopping it. Women must use effective birth control with this medicine. What side effects may I notice from receiving this medicine? Side effects that you should report to your doctor or health care professional as soon as possible:  allergic reactions like skin rash, itching or hives, swelling of the face, lips, or tongue  chest pain or palpitations  cough  dizziness  feeling faint or lightheaded, falls  fever  general ill feeling or flu-like symptoms  signs of worsening heart failure like breathing problems; swelling in your legs and feet  unusually weak or tired Side effects that usually do not require medical attention (report to your doctor or health care professional if they continue or are bothersome):  bone pain  changes in taste  diarrhea  joint pain  nausea/vomiting  weight loss This list may not describe all possible side effects. Call your doctor for medical advice about side effects. You may report side effects to FDA at 1-800-FDA-1088. Where should I keep my medicine? This drug is given in a hospital or clinic and will not be stored at home. NOTE: This sheet is a summary. It may not cover all possible information. If you have questions about this medicine, talk to your doctor, pharmacist, or health care  provider.  2020 Elsevier/Gold Standard (2016-06-29 14:37:52)

## 2019-11-23 NOTE — Progress Notes (Signed)
Pt arrived to flush with shortness of breath. Stated it's been going on for 2 days. [place], called to evaluated pt. Teresa Hays, arrived. Pt was moved to [facility] for further evaluation.

## 2019-11-23 NOTE — Progress Notes (Signed)
    SUBJECTIVE:   CHIEF COMPLAINT / HPI: follow up, pap smear   Teresa Hays is a 64 y.o. female presenting for follow-up and a Pap smear.  Patient states that this is her fourth doctor appointment of the day and she would prefer to reschedule for her Pap smear.  Persistent cough  healthcare maintenance  She was recently evaluated by pulmonology and was prescribed doxycycline, prednisone and stopped after inhaler.  She states that her breathing feels somewhat better.  Reviewed pulmonology notes with patient.  Reviewed healthcare maintenance items and patient is due for tetanus booster.  Reviewed immunization records with CMA and patient has agreed to have this vaccine today.  Patient states that she was able to complete both doses of her 5 letter vaccine for COVID-19 with the dates of 09/30/2019 and 10/20/2019.  Reviewed medication list and patient requests to notify us of when she needs refills, declined offer for refills to be submitted today.  PERTINENT  PMH / PSH: GERD, hypertension, metastatic breast cancer, right breast   OBJECTIVE:   BP (!) 140/100   Pulse 89   Ht 5\' 4"  (1.626 m)   Wt 137 lb 6.4 oz (62.3 kg)   SpO2 97%   BMI 23.58 kg/m    General: female appearing stated age in no acute distress HEENT: MMM, no oral lesions noted, Cardio: Normal S1 and S2, no S3 or S4. Rhythm is regular. No murmurs or rubs.  Bilateral radial pulses palpable Pulm: Bilateral and diffuse wheezing and rhonchi, no retractions noted, patient on room air, patient intermittently coughing throughout exam/nonproductive Abdomen: Bowel sounds normal. Abdomen soft and non-tender.  Extremities: No peripheral edema. Warm/ well perfused.  Patient with compression stockings on Neuro: pt alert and oriented x4   ASSESSMENT/PLAN:   Cough, persistent Patient valuated by pulmonology a few hours prior to this visit.  Patient taking tapered dose of prednisone and doxycycline.  Recently prescribed albuterol  inhaler as well as inhaled corticosteroid.  States that breathing feels better with this. -Patient encouraged to continue medications as prescribed by her pulmonologist   Health maintenance examination Patient has completed Covid vaccination. -Received tetanus booster today -Patient will be due for Pap smear, patient to call for rescheduling of this   Patient to call and reschedule Pap smear.  Stark Klein, MD [place]

## 2019-11-23 NOTE — Assessment & Plan Note (Signed)
This is a large component to the patient's problem Despite taking omeprazole 40 mg twice daily patient is still having daily breakthrough reflux Discussed today with patient that this can affect breathing, cause wheezing as well as the fact respiratory status  Plan: Encourage patient to reestablish with her gastroenterologist Dr. Collene Mares Continue omeprazole 40 mg twice daily Follow GERD lifestyle recommendations

## 2019-11-23 NOTE — Progress Notes (Signed)
Subjective:  Patient ID: Teresa Hays, female    DOB: [birth year],  MRN: [id number]  Chief Complaint  Patient presents with  . Foot Pain    pt is here for a f/u of capsulitis to the right foot, pt states that the right foot is doing alot better since the last time she was here, pt also states that she is ready to get out of the boot.    64 y.o. female presents with the above complaint.  Patient presents for follow-up of right fourth and fifth digit capsulitis with underlying metatarsalgia.  Patient states that she is doing a lot better.  She would like to get out of the surgical shoe.  Her pain scale is 2 out of 10.  She states that her previous injections in the past have helped.  She states her surgical shoe has also been helping.  She has also tried to self transition herself out of it which seems to be helping.  She denies any other acute complaints.  Review of Systems: Negative except as noted in the HPI. Denies N/V/F/Ch.  Past Medical History:  Diagnosis Date  . GERD (gastroesophageal reflux disease)   . Hypertension   . rt breast ca with mets to liver dx'd 03/2018    Current Outpatient Medications:  .  albuterol (VENTOLIN HFA) 108 (90 Base) MCG/ACT inhaler, Inhale 1-2 puffs into the lungs every 6 (six) hours as needed for wheezing or shortness of breath., Disp: , Rfl:  .  amLODipine (NORVASC) 5 MG tablet, Take 1 tablet (5 mg total) by mouth daily., Disp: 30 tablet, Rfl: 2 .  Budeson-Glycopyrrol-Formoterol (BREZTRI AEROSPHERE) 160-9-4.8 MCG/ACT AERO, Inhale 2 puffs into the lungs 2 (two) times daily., Disp: 11.8 g, Rfl: 0 .  dexlansoprazole (DEXILANT) 60 MG capsule, Take 1 capsule (60 mg total) by mouth daily., Disp: 30 capsule, Rfl: 1 .  diphenoxylate-atropine (LOMOTIL) 2.5-0.025 MG tablet, Take 1-2 tablets by mouth 4 (four) times daily as needed for diarrhea or loose stools., Disp: 60 tablet, Rfl: 1 .  doxycycline (VIBRA-TABS) 100 MG tablet, Take 1 tablet (100 mg total) by  mouth 2 (two) times daily., Disp: 14 tablet, Rfl: 0 .  DULoxetine (CYMBALTA) 20 MG capsule, TAKE 1 CAPSULE BY MOUTH ONCE DAILY, Disp: 30 capsule, Rfl: 1 .  gabapentin (NEURONTIN) 300 MG capsule, TAKE 1 CAPSULE BY MOUTH IN THE MORNING, 1 CAPSULE MIDDAY AND 3 CAPSULES AT BEDTIME, Disp: 150 capsule, Rfl: 3 .  HYDROcodone-homatropine (HYCODAN) 5-1.5 MG/5ML syrup, Take 5 mLs by mouth every 6 (six) hours as needed for cough., Disp: 120 mL, Rfl: 0 .  letrozole (FEMARA) 2.5 MG tablet, TAKE 1 TABLET (2.5 MG TOTAL) BY MOUTH DAILY., Disp: 90 tablet, Rfl: 1 .  lidocaine-prilocaine (EMLA) cream, Apply to affected area once, Disp: 30 g, Rfl: 3 .  omeprazole (PRILOSEC) 20 MG capsule, TAKE 2 CAPSULES BY MOUTH TWO TIMES DAILY BEFORE A MEAL, Disp: 60 capsule, Rfl: 2 .  potassium chloride SA (KLOR-CON) 20 MEQ tablet, TAKE 1 TABLET BY MOUTH ONCE DAILY, Disp: 30 tablet, Rfl: 0 .  predniSONE (DELTASONE) 10 MG tablet, 4 tabs for 2 days, then 3 tabs for 2 days, 2 tabs for 2 days, then 1 tab for 2 days, then stop, Disp: 20 tablet, Rfl: 0 .  rivaroxaban (XARELTO) 20 MG TABS tablet, TAKE 1 TABLET BY MOUTH DAILY WITH SUPPER, Disp: 30 tablet, Rfl: 5 .  spironolactone (ALDACTONE) 25 MG tablet, Take 1 tablet (25 mg total) by mouth daily., Disp: 30  tablet, Rfl: 6 .  traMADol (ULTRAM) 50 MG tablet, Take 1 tablet (50 mg total) by mouth every 6 (six) hours as needed for moderate pain or severe pain., Disp: 30 tablet, Rfl: 0 .  carvedilol (COREG) 3.125 MG tablet, Take 1 tablet (3.125 mg total) by mouth 2 (two) times daily., Disp: 180 tablet, Rfl: 3 .  losartan (COZAAR) 50 MG tablet, Take 1 tablet (50 mg total) by mouth daily., Disp: 90 tablet, Rfl: 3  Social History   Tobacco Use  Smoking Status Never Smoker  Smokeless Tobacco Never Used    No Known Allergies Objective:  There were no vitals filed for this visit. There is no height or weight on file to calculate BMI. Constitutional Well developed. Well nourished.  Vascular  Dorsalis pedis pulses palpable bilaterally. Posterior tibial pulses palpable bilaterally. Capillary refill normal to all digits.  No cyanosis or clubbing noted. Pedal hair growth normal.  Neurologic Normal speech. Oriented to person, place, and time. Epicritic sensation to light touch grossly present bilaterally.  Dermatologic Nails well groomed and normal in appearance. No open wounds. No skin lesions.  Orthopedic:  Now pain on palpation to the right fifth metatarsal phalangeal joint and right fourth metatarsophalangeal joint.  Mild pain with range of motion of the joint.  Negative Mulder's click.  However unable to differentiate between the interspace as well as the capsulitis from the joint as it is very narrow.   Radiographs: 2 views of skeletally mature adult foot: 2 views of skeletally mature adult foot.  There is decrease in first metatarsophalangeal joint space even in nature consistent with mild arthritic changes.  No other bony abnormalities noted.  There is adductovarus component of the fifth digit.  No midfoot arthritis.  Mild posterior and plantar heel spur noted.  Hammertoe contracture of the second digit noted. Assessment:   1. Capsulitis of foot, right   2. Capsulitis of toe of right foot   3. Metatarsalgia of right foot    Plan:  Patient was evaluated and treated and all questions answered.  Right fourth and fifth digit metatarsophalangeal joint capsulitis secondary to metatarsalgia -Clinically her pain has completely resolved.  She does not have any further pain.  She can begin transitioning from surgical shoe into regular shoes. -I discussed with her in great detail about orthotics and custom made orthotics to take the stress off of the fourth and fifth metatarsophalangeal joint.  Patient states that she will think about it.  She would like to try over-the-counter orthotics such as power steps for now. -I will hold off on any further steroid injection for now. -I  discussed with the patient that if her pain returns we will consider custom-made orthotics and possible more injections.  Patient states understanding  Return if symptoms worsen or fail to improve.

## 2019-11-23 NOTE — Progress Notes (Signed)
Pt stayed for 15 min post-injection observation period.  Tolerated well.  VSS.  Pt going to pulmonologist to f/u today on SOB.

## 2019-11-24 ENCOUNTER — Encounter: Payer: Self-pay | Admitting: Medical

## 2019-11-24 DIAGNOSIS — R05 Cough: Secondary | ICD-10-CM | POA: Insufficient documentation

## 2019-11-24 DIAGNOSIS — R053 Chronic cough: Secondary | ICD-10-CM | POA: Insufficient documentation

## 2019-11-24 NOTE — Assessment & Plan Note (Signed)
Patient valuated by pulmonology a few hours prior to this visit.  Patient taking tapered dose of prednisone and doxycycline.  Recently prescribed albuterol inhaler as well as inhaled corticosteroid.  States that breathing feels better with this. -Patient encouraged to continue medications as prescribed by her pulmonologist

## 2019-11-24 NOTE — Assessment & Plan Note (Signed)
Patient has completed Covid vaccination. -Received tetanus booster today -Patient will be due for Pap smear, patient to call for rescheduling of this

## 2019-11-25 MED FILL — LOSARTAN POTASSIUM 50 MG TA: 50 | 30 days supply | Qty: 30 | Fill #8

## 2019-11-26 ENCOUNTER — Other Ambulatory Visit: Payer: Self-pay | Admitting: Hematology

## 2019-11-28 NOTE — Progress Notes (Signed)
PCCM: Thanks for seeing and managing her Garner Nash, DO [facility] 11/28/2019 5:49 PM

## 2019-12-03 ENCOUNTER — Ambulatory Visit (INDEPENDENT_AMBULATORY_CARE_PROVIDER_SITE_OTHER): Payer: Medicaid Other | Admitting: Pulmonary Disease

## 2019-12-03 ENCOUNTER — Other Ambulatory Visit: Payer: Self-pay

## 2019-12-03 ENCOUNTER — Encounter: Payer: Self-pay | Admitting: Pulmonary Disease

## 2019-12-03 DIAGNOSIS — C50919 Malignant neoplasm of unspecified site of unspecified female breast: Secondary | ICD-10-CM

## 2019-12-03 DIAGNOSIS — R0602 Shortness of breath: Secondary | ICD-10-CM | POA: Diagnosis not present

## 2019-12-03 DIAGNOSIS — K219 Gastro-esophageal reflux disease without esophagitis: Secondary | ICD-10-CM | POA: Diagnosis not present

## 2019-12-03 NOTE — Assessment & Plan Note (Signed)
Resolving bronchitis-like episode  Plan: Schedule pulmonary function testing Finish Breztri samples Notify our office if breathing worsens after stopping inhaler therapy

## 2019-12-03 NOTE — Patient Instructions (Addendum)
You were seen today by Lauraine Rinne, NP  for:   1. Gastroesophageal reflux disease, unspecified whether esophagitis present  Continue omeprazole  If acid reflux worsens please contact gastroenterology for an appointment  2. Shortness of breath  Finish inhaler samples  Get scheduled for pulmonary function testing  Notify our office if your breathing worsens  3. Metastatic breast cancer [facility])  Keep follow-up with oncology  Follow Up:    Return in about 6 weeks (around 01/14/2020), or if symptoms worsen or fail to improve, for Follow up for PFT, Follow up with Dr. Valeta Harms, Follow up with [name] FNP-C.   Please do your part to reduce the spread of COVID-19:      Reduce your risk of any infection  and COVID19 by using the similar precautions used for avoiding the common cold or flu:  Marland Kitchen Wash your hands often with soap and warm water for at least 20 seconds.  If soap and water are not readily available, use an alcohol-based hand sanitizer with at least 60% alcohol.  . If coughing or sneezing, cover your mouth and nose by coughing or sneezing into the elbow areas of your shirt or coat, into a tissue or into your sleeve (not your hands). Langley Gauss A MASK when in public  . Avoid shaking hands with others and consider head nods or verbal greetings only. . Avoid touching your eyes, nose, or mouth with unwashed hands.  . Avoid close contact with people who are sick. . Avoid places or events with large numbers of people in one location, like concerts or sporting events. . If you have some symptoms but not all symptoms, continue to monitor at home and seek medical attention if your symptoms worsen. . If you are having a medical emergency, call 911.   [place] / e-Visit: [email or web address]         [facility]: [facility]: [id number]                     [facility]: [id number]     It is flu season:   >>> Best ways to protect herself from the flu: Receive the yearly flu vaccine, practice good hand hygiene washing with soap and also using hand sanitizer when available, eat a nutritious meals, get adequate rest, hydrate appropriately   Please contact the office if your symptoms worsen or you have concerns that you are not improving.   Thank you for choosing [facility] for your healthcare, and for allowing [place] to partner with you on your healthcare journey. I am thankful to be able to provide care to you today.   [name] FNP-C

## 2019-12-03 NOTE — Assessment & Plan Note (Signed)
Patient reporting acid reflux has improved significantly over the last 7 to 10 days  Plan: Continue omeprazole as managed by primary care and gastroenterology If patient starts that breakthrough reflux symptoms again she needs to present back to gastroenterology

## 2019-12-03 NOTE — Assessment & Plan Note (Signed)
Plan:  Keep follow up with oncology 

## 2019-12-03 NOTE — Progress Notes (Signed)
Virtual Visit via Telephone Note  I connected with Teresa Hays on 12/03/19 at  3:00 PM EDT by telephone and verified that I am speaking with the correct person using two identifiers.  Location: Patient: Home Provider: [facility] - [id number], [place], [place], [place] [address]   I discussed the limitations, risks, security and privacy concerns of performing an evaluation and management service by telephone and the availability of in person appointments. I also discussed with the patient that there may be a patient responsible charge related to this service. The patient expressed understanding and agreed to proceed.  Patient consented to consult via telephone: Yes People present and their role in pt care: Pt    History of Present Illness:  64 year old female never smoker initially referred to our office in November/2021 for evaluation of cough  PMH: Metastatic breast cancer, allergic rhinitis, anemia Smoker/ Smoking History: Never smoker Maintenance: Breztri  Pt of: Dr. Valeta Harms  Chief complaint: 7 to 10-day follow-up  64 year old female never smoker completing a 7 to 10-day follow-up with our office today.  She was last seen after an acute visit with oncology.  There were concerns for potentially patient needing admission due to inspiratory and expiratory wheezes.  Pulmonary function testing was ordered.  She was treated with doxycycline prednisone and started empirically on breztri.   Patient reports today that she is feeling much better since last being seen she reports that her acid reflux has improved.  She has not yet followed up with gastroenterology.  She is completed doxycycline and prednisone.  She still is using her Breztri samples.  She has not been scheduled for pulmonary function testing yet.    Observations/Objective:  Social History   Tobacco Use  Smoking Status Never Smoker  Smokeless Tobacco Never Used   Immunization History   Administered Date(s) Administered  . Influenza,inj,Quad PF,6+ Mos 04/28/2018, 04/06/2019  . Tdap 11/23/2019      Assessment and Plan:  Gastroesophageal reflux disease Patient reporting acid reflux has improved significantly over the last 7 to 10 days  Plan: Continue omeprazole as managed by primary care and gastroenterology If patient starts that breakthrough reflux symptoms again she needs to present back to gastroenterology  Shortness of breath Resolving bronchitis-like episode  Plan: Schedule pulmonary function testing Finish Breztri samples Notify our office if breathing worsens after stopping inhaler therapy  Metastatic breast cancer [facility]) Plan: Keep follow-up with oncology   Follow Up Instructions:  Return in about 6 weeks (around 01/14/2020), or if symptoms worsen or fail to improve, for Follow up for PFT, Follow up with Dr. Valeta Harms, Follow up with [name] FNP-C.   I discussed the assessment and treatment plan with the patient. The patient was provided an opportunity to ask questions and all were answered. The patient agreed with the plan and demonstrated an understanding of the instructions.   The patient was advised to call back or seek an in-person evaluation if the symptoms worsen or if the condition fails to improve as anticipated.  I provided 24 minutes of non-face-to-face time during this encounter.   Lauraine Rinne, NP

## 2019-12-06 NOTE — Progress Notes (Signed)
Pharmacist Chemotherapy Monitoring - Follow Up Assessment    I verify that I have reviewed each item in the below checklist:  . Regimen for the patient is scheduled for the appropriate day and plan matches scheduled date. Marland Kitchen Appropriate non-routine labs are ordered dependent on drug ordered. . If applicable, additional medications reviewed and ordered per protocol based on lifetime cumulative doses and/or treatment regimen.   Plan for follow-up and/or issues identified: No . I-vent associated with next due treatment: No . MD and/or nursing notified: No  Joram Venson D 12/06/2019 4:48 PM

## 2019-12-11 ENCOUNTER — Other Ambulatory Visit: Payer: Self-pay | Admitting: Hematology

## 2019-12-11 NOTE — Progress Notes (Signed)
[place]   [phone] [phone]   Clinic Follow up Note   Patient Care Team: [name], DO as PCP - General (Family Medicine) [name], MD as Consulting Physician (Gastroenterology) [name], MD as Consulting Physician (Hematology) 12/12/2019  CHIEF COMPLAINT: F/u metastatic breast cancer   SUMMARY OF ONCOLOGIC HISTORY: Oncology History Overview Note  Cancer Staging Metastatic breast cancer [facility]) Staging form: Breast, AJCC 8th Edition - Clinical stage from 03/24/2018: Stage IV (cT2, cN1, pM1, G3, ER+, PR+, HER2+) - Signed by [name], MD on 03/30/2018     Metastatic breast cancer ([place])  01/30/2018 Procedure   Colonoscopy showed small polyp in the sigmoid colon, removed, the exam of colon including the terminal ileum was otherwise negative.   01/30/2018 Procedure   EGD by Dr. Collene Mares showed small hiatal hernia, a 8 mm polypoid lesion in the cardia, biopsied.   03/09/2018 Imaging   03/09/2018 US Abdomen IMPRESSION: 1. Mass lesions throughout the liver, consistent with metastatic disease. Liver as a somewhat nodular contour suggesting underlying hepatic cirrhosis. Inhomogeneous echotexture to the liver.  2. Cholelithiasis with mild gallbladder wall thickening. A degree of cholecystitis cannot be excluded by ultrasound.  3. Portions of pancreas obscured by gas. Visualized portions of pancreas appear normal.  4. Small right kidney. Etiology uncertain. This finding potentially may be indicative of renal artery stenosis. In this regard, question whether patient is hypertensive.   03/15/2018 Imaging   CT CAP with contrast  IMPRESSION: 1. Widespread hepatic metastasis. 2. 2.6 cm lateral right breast soft tissue nodule could represent a breast primary or an incidental benign lesion. Consider correlation with mammogram and ultrasound. 3. No definite source of primary malignancy identified within the abdomen or pelvis. There is  possible rectosigmoid junction wall thickening. Consider colonoscopy with attention to this area. 4. Distal esophageal wall thickening, suggesting esophagitis.   03/24/2018 Cancer Staging   Staging form: Breast, AJCC 8th Edition - Clinical stage from 03/24/2018: Stage IV (cT2, cN1, pM1, G3, ER+, PR+, HER2+) - Signed by [name], MD on 03/30/2018   03/24/2018 Initial Biopsy   Diagnosis 1. 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 Breast, right, needle core biopsy, 9 o'clock, 7cm from nipple - INVASIVE DUCTAL CARCINOMA. -The carcinoma is somewhat morphologically dissimilar from that in part 1. It appears grade III 3. Lymph node, needle/core biopsy, right axillary - METASTATIC CARCINOMA IN 1 OF 1 LYMPH NODE (1/1).   03/24/2018 Receptors her2   Breast biopsy: 1. Estrogen Receptor: 40%, POSITIVE, STRONG-MODERATE STAINING INTENSITY Progesterone Receptor: 70%, POSITIVE, STRONG STAINING INTENSITY Proliferation Marker Ki67: 20% HER 2 equivocal by IHC 2+, POSITIVE by FISH, ratio 2.4 and copy #4.2  2. Estrogen Receptor: 60%, POSITIVE, MODERATE STAINING INTENSITY Progesterone Receptor: 40%, POSITIVE, MODERATE STAINING INTENSITY Proliferation Marker Ki67: 20% HER2 (+) by IHC 3+   03/24/2018 Initial Diagnosis   Metastatic breast cancer ([place])   03/27/2018 Pathology Results   Diagnosis Liver, needle/core biopsy, Right - METASTATIC CARCINOMA TO LIVER, CONSISTENT WITH PATIENTS CLINICAL HISTORY OF PRIMARY BREAST CARCINOMA.  ER 80%+ PR40%+ HER2- (by [facility], IHC 2+)  Ki67 50%    03/28/2018 Pathology Results   03/28/2018 Surgical Pathology Diagnosis 1. Breast, left, needle core biopsy, 9 o'clock - FIBROCYSTIC CHANGES. - THERE IS NO EVIDENCE OF MALIGNANCY. 2. Breast, left, needle core biopsy, 2 o'clock - FIBROADENOMA. - THERE IS NO EVIDENCE OF MALIGNANCY. - SEE COMMENT.  03/29/2018 Imaging   03/29/2018 Bone  Scan IMPRESSION: No scintigraphic evidence of osseous metastatic disease.   03/30/2018 Imaging   Bone scan  IMPRESSION: No scintigraphic evidence of osseous metastatic disease.    04/07/2018 -  Chemotherapy   First line chemo weekly Taxol and herceptin/Perjeta every 3 weeks starting 04/07/18. She developed infusion reaction to taxol and it was discontinued. Added Abraxane on C1D8, 2 weeks on/1 week off.  Abraxne stopped after 10/13/18 due to worsening Neuropathy. She has continued with maintenance Herceptin injection/Perjeta q3weeks. Switched to Herceptin injections on 09/21/19.    06/06/2018 Imaging   CT CAP IMPRESSION: 1. Generally improved appearance, with reduced axillary adenopathy and reduced enhancing component of the hepatic masses, with some of the hepatic mass is moderately smaller than on the prior exam. Reduced size of the right lateral breast mass compared to the prior 03/15/2018 exam. 2. New mild interstitial accentuation in the lungs, significance uncertain. Part of this appearance may be due to lower lung volumes on today's exam. 3. Mild wall thickening in the descending colon and upper rectum suggesting low-grade colitis/inflammation. Prominent stool throughout the colon favors constipation. 4. Other imaging findings of potential clinical significance: Aortic Atherosclerosis (ICD10-I70.0). Mild cardiomegaly. Mild nodularity in the right lower lobe appears stable. Contracted and thick-walled gallbladder.    09/18/2018 Imaging   CT CAP W Contrast 09/18/18  IMPRESSION: 1. Liver metastases have decreased in size. 2. Right breast mass, mild right axillary lymphadenopathy and scattered tiny right pulmonary nodules are all stable. 3. New mild left supraclavicular and left subpectoral lymphadenopathy, can not exclude progression of metastatic nodal disease. 4. Moderate colorectal stool volume, which may indicate constipation. 5.  Aortic Atherosclerosis (ICD10-I70.0).   10/2018 -   Anti-estrogen oral therapy   Letrozole 2.5 mg daily starting 10/2018   01/10/2019 Imaging   CT CAP W Contrast 01/10/19  IMPRESSION: 1. Continued improvement in the hepatic metastatic lesions which have reduced in size. 2. Stable mild left supraclavicular and subpectoral adenopathy. 3. Essentially stable small right lower lobe pulmonary nodule and separate small subpleural nodule along the right hemidiaphragm. Surveillance suggested. 4. Other imaging findings of potential clinical significance: Mild cardiomegaly. Mild circumferential distal esophageal wall thickening, the most common cause would be esophagitis. Airway thickening is present, suggesting bronchitis or reactive airways disease. Airway plugging in the lower lobes and in the right middle lobe. Stable small right adrenal adenoma.   05/15/2019 Imaging   CT CAP W Contrast 05/15/19  IMPRESSION: CT CHEST IMPRESSION   1. Similar appearance of borderline supraclavicular, axillary, and subpectoral adenopathy. 2. Improved and resolved right lower lobe pulmonary nodularity. 3. Esophageal air fluid level suggests dysmotility or gastroesophageal reflux.   CT ABDOMEN AND PELVIS IMPRESSION   1. Improved hepatic metastasis. 2. Small bowel mesenteric lymph nodes which are upper normal and mildly enlarged. Likely increased and similar as detailed above. Indeterminate. Recommend attention on follow-up. 3. Cholelithiasis. 4. Motion degradation throughout the lower chest and abdomen.   09/19/2019 Imaging   CT CAP W contrast  IMPRESSION: 1. Interval decrease in size of right axillary and subpectoral lymph nodes. There are no pathologically enlarged lymph nodes remaining in the chest, abdomen, or pelvis. No new lymphadenopathy. 2. No significant change in post treatment appearance of multiple low-attenuation liver lesions, in keeping with treated metastases. 3. No evidence of new metastatic disease in the chest, abdomen, or pelvis. 4.  Aortic Atherosclerosis (ICD10-I70.0).   11/16/2019 Imaging   IMRI Brain  MPRESSION: Minimal chronic microvascular ischemic changes. No  acute intracranial process.   Active bilateral maxillary sinus disease with mild frontoethmoid mucosal thickening.     CURRENT THERAPY:  -First line weekly TaxolwithHerceptin/Perjeta every 3 weeks starting 04/07/18. She developed infusion reaction to taxol and it was discontinued. Changed to Abraxaneweekly2 weeks on/1 week offfrom 07/21/2018.Abraxane held after October 13, 2018 due to worsening neuropathy.She will continue Herceptin injection/Perjeta q3weeksmaintenance therapy.Herceptin switched to injection on 09/21/19. -Letrozole 2.5 mg daily starting 10/2018  INTERVAL HISTORY: Teresa Hays returns for f/u and treatment as scheduled. She completed another Herceptin injection on 5/7. She was seen by [facility] and pulmonology for worsening dyspnea and cough in the interval. GERD was suspected to contribute, on omeprazole BID. Insurance has not covered [place]. She was also thought to have a component of bronchitis vs asthma exacerbation, she was given prednisone taper and antibiotics and inhaler. Last echo 08/29/19 showed grade I diastolic dysfunction and mild focal atrophy of the basilar septum.   Today, Teresa Hays presents with her daughter. She feels well. Dyspnea resolved in the interval. She takes omeprazole 2 BID and continues inhaler. Denies cough, chest pain. She prefers injection to infusion. Appetite and energy are adequate. Denies fever or chills. No n/v/c/d. Tingling in her fingertips is slightly worse but can still function mostly normally. Has some difficulty with necklace clasp. She has occasional cramps in her legs. In [place]. Leg edema. Denies rash, pain, or other issues.    MEDICAL HISTORY:  Past Medical History:  Diagnosis Date  . GERD (gastroesophageal reflux disease)   . Hypertension   . rt breast ca with mets to liver dx'd 03/2018     SURGICAL HISTORY: Past Surgical History:  Procedure Laterality Date  . COLONOSCOPY    . ESOPHAGOGASTRODUODENOSCOPY ENDOSCOPY    . IR IMAGING GUIDED PORT INSERTION  04/04/2018    I have reviewed the social history and family history with the patient and they are unchanged from previous note.  ALLERGIES:  has No Known Allergies.  MEDICATIONS:  Current Outpatient Medications  Medication Sig Dispense Refill  . albuterol (VENTOLIN HFA) 108 (90 Base) MCG/ACT inhaler Inhale 1-2 puffs into the lungs every 6 (six) hours as needed for wheezing or shortness of breath.    Marland Kitchen amLODipine (NORVASC) 5 MG tablet Take 1 tablet (5 mg total) by mouth daily. 30 tablet 2  . Budeson-Glycopyrrol-Formoterol (BREZTRI AEROSPHERE) 160-9-4.8 MCG/ACT AERO Inhale 2 puffs into the lungs 2 (two) times daily. 11.8 g 0  . diphenoxylate-atropine (LOMOTIL) 2.5-0.025 MG tablet Take 1-2 tablets by mouth 4 (four) times daily as needed for diarrhea or loose stools. 60 tablet 1  . DULoxetine (CYMBALTA) 20 MG capsule TAKE 1 CAPSULE BY MOUTH ONCE DAILY 30 capsule 1  . gabapentin (NEURONTIN) 300 MG capsule May take up to 3 capsules three times daily 270 capsule 3  . letrozole (FEMARA) 2.5 MG tablet TAKE 1 TABLET (2.5 MG TOTAL) BY MOUTH DAILY. 90 tablet 1  . lidocaine-prilocaine (EMLA) cream Apply to affected area once 30 g 3  . omeprazole (PRILOSEC) 40 MG capsule Take 1 capsule (40 mg total) by mouth 2 (two) times daily. 60 capsule 3  . potassium chloride SA (KLOR-CON) 20 MEQ tablet TAKE 1 TABLET BY MOUTH ONCE DAILY 30 tablet 0  . rivaroxaban (XARELTO) 20 MG TABS tablet TAKE 1 TABLET BY MOUTH DAILY WITH SUPPER 30 tablet 5  . spironolactone (ALDACTONE) 25 MG tablet Take 1 tablet (25 mg total) by mouth daily. 30 tablet 6  . traMADol (ULTRAM) 50 MG tablet Take 1  tablet (50 mg total) by mouth every 6 (six) hours as needed for moderate pain or severe pain. 30 tablet 0  . carvedilol (COREG) 3.125 MG tablet Take 1 tablet (3.125 mg  total) by mouth 2 (two) times daily. 180 tablet 3  . losartan (COZAAR) 50 MG tablet Take 1 tablet (50 mg total) by mouth daily. 90 tablet 3   No current facility-administered medications for this visit.    PHYSICAL EXAMINATION: ECOG PERFORMANCE STATUS: 1 - Symptomatic but completely ambulatory  Vitals:   12/12/19 0845  BP: 139/79  Pulse: 68  Resp: 17  Temp: 98.9 F (37.2 C)  SpO2: 100%   Filed Weights   12/12/19 0845  Weight: 136 lb 9.6 oz (62 kg)    GENERAL:alert, no distress and comfortable SKIN: no rash  EYES: sclera clear LUNGS: clear with normal breathing effort HEART: regular rate & rhythm, no lower extremity edema NEURO: alert & oriented x 3 with fluent speech, normal gait. Intact peripheral vibratory sense to fingertips on tuning fork exam  PAC without erythema   LABORATORY DATA:  I have reviewed the data as listed CBC Latest Ref Rng & Units 12/12/2019 11/23/2019 11/02/2019  WBC 4.0 - 10.5 K/uL 6.5 9.3 7.3  Hemoglobin 12.0 - 15.0 g/dL 10.3(L) 12.1 10.7(L)  Hematocrit 36.0 - 46.0 % 32.4(L) 37.4 33.7(L)  Platelets 150 - 400 K/uL 179 223 219     CMP Latest Ref Rng & Units 12/12/2019 11/23/2019 11/02/2019  Glucose 70 - 99 mg/dL 115(H) 111(H) 106(H)  BUN 8 - 23 mg/dL _0 Creatinine 0.44 - 1.00 mg/dL 1.15(H) 1.29(H) 1.35(H)  Sodium 135 - 145 mmol/L 138 141 139  Potassium 3.5 - 5.1 mmol/L 4.3 4.2 4.1  Chloride 98 - 111 mmol/L 106 105 107  CO2 22 - 32 mmol/L 24 23 21(L)  Calcium 8.9 - 10.3 mg/dL 9.4 9.9 9.1  Total Protein 6.5 - 8.1 g/dL 6.7 8.1 7.4  Total Bilirubin 0.3 - 1.2 mg/dL 0.4 0.4 0.4  Alkaline Phos 38 - 126 U/L 129(H) 171(H) 136(H)  AST 15 - 41 U/L _1 ALT 0 - 44 U/L _2 RADIOGRAPHIC STUDIES: I have personally reviewed the radiological images as listed and agreed with the findings in the report. ECHOCARDIOGRAM COMPLETE  Result Date: 12/12/2019    ECHOCARDIOGRAM REPORT   Patient Name:   Teresa Hays [facility] Date of Exam: 12/12/2019  Medical Rec #:  [id number]           Height:       64.0 in Accession #:    [id number]          Weight:       136.6 lb Date of Birth:  July 17, 1956          BSA:          1.664 m Patient Age:    37 years            BP:           138/89 mmHg Patient Gender: F                   HR:           65 bpm. Exam Location:  Outpatient Procedure: 2D Echo Indications:    Chemotherapy evaluation v87.41 / v58.11  History:        Patient has prior history of Echocardiogram examinations, most  recent 08/29/2019. Risk Factors:Hypertension. GERD. Breast                 Cancer.  Sonographer:    Darlina Sicilian RDCS Referring Phys: [id number] DANIEL R BENSIMHON IMPRESSIONS  1. Left ventricular ejection fraction, by estimation, is 50 to 55%. The left ventricle has normal function. The left ventricle has no regional wall motion abnormalities. Left ventricular diastolic parameters are consistent with Grade I diastolic dysfunction (impaired relaxation). The average left ventricular global longitudinal strain is -15.0 %.  2. Right ventricular systolic function is normal. The right ventricular size is normal.  3. Left atrial size was mildly dilated.  4. The mitral valve is normal in structure. Trivial mitral valve regurgitation.  5. The aortic valve is normal in structure. Aortic valve regurgitation is not visualized.  6. The inferior vena cava is normal in size with greater than 50% respiratory variability, suggesting right atrial pressure of 3 mmHg. FINDINGS  Left Ventricle: Left ventricular ejection fraction, by estimation, is 50 to 55%. The left ventricle has normal function. The left ventricle has no regional wall motion abnormalities. The average left ventricular global longitudinal strain is -15.0 %. The left ventricular internal cavity size was normal in size. There is no left ventricular hypertrophy. Left ventricular diastolic parameters are consistent with Grade I diastolic dysfunction (impaired relaxation). Right Ventricle: The  right ventricular size is normal. No increase in right ventricular wall thickness. Right ventricular systolic function is normal. Left Atrium: Left atrial size was mildly dilated. Right Atrium: Right atrial size was normal in size. Pericardium: There is no evidence of pericardial effusion. Mitral Valve: The mitral valve is normal in structure. Trivial mitral valve regurgitation. Tricuspid Valve: The tricuspid valve is normal in structure. Tricuspid valve regurgitation is trivial. Aortic Valve: The aortic valve is normal in structure. Aortic valve regurgitation is not visualized. Pulmonic Valve: The pulmonic valve was grossly normal. Pulmonic valve regurgitation is trivial. Aorta: The aortic root and ascending aorta are structurally normal, with no evidence of dilitation. Venous: The inferior vena cava is normal in size with greater than 50% respiratory variability, suggesting right atrial pressure of 3 mmHg. IAS/Shunts: No atrial level shunt detected by color flow Doppler.  LEFT VENTRICLE PLAX 2D LVIDd:         3.50 cm  Diastology LVIDs:         2.20 cm  LV e' lateral:   9.25 cm/s LV PW:         0.70 cm  LV E/e' lateral: 6.3 LV IVS:        1.10 cm  LV e' medial:    5.66 cm/s LVOT diam:     1.90 cm  LV E/e' medial:  10.3 LV SV:         46 LV SV Index:   28       2D Longitudinal Strain LVOT Area:     2.84 cm 2D Strain GLS Avg:     -15.0 %  RIGHT VENTRICLE RV S prime:     9.36 cm/s TAPSE (M-mode): 1.4 cm LEFT ATRIUM             Index       RIGHT ATRIUM           Index LA diam:        3.70 cm 2.22 cm/m  RA Area:     10.80 cm LA Vol (A2C):   50.0 ml 30.05 ml/m RA Volume:   20.50 ml  12.32 ml/m  LA Vol (A4C):   32.4 ml 19.47 ml/m LA Biplane Vol: 40.2 ml 24.16 ml/m  AORTIC VALVE LVOT Vmax:   77.70 cm/s LVOT Vmean:  55.000 cm/s LVOT VTI:    0.162 m  AORTA Ao Root diam: 3.00 cm MITRAL VALVE MV Area (PHT): 3.60 cm    SHUNTS MV Decel Time: 211 msec    Systemic VTI:  0.16 m MV E velocity: 58.30 cm/s  Systemic Diam: 1.90  cm MV A velocity: 74.10 cm/s MV E/A ratio:  0.79 Glori Bickers MD Electronically signed by Glori Bickers MD Signature Date/Time: 12/12/2019/1:01:17 PM    Final      ASSESSMENT & PLAN: Teresa Hays is a 64 y.o. female with    1. 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 Liver met was HER2-.  -Given the metastatic disease, her cancer is not curable but still treatable. Shehas been treated withLetrozole daily andFirst-line Herceptin/Perjetaevery 3 weeks and weekly Taxol. Due to infusionreaction, taxol was switched to Abraxane.We stoppedAbraxaneafter cycle 10 due to worsening neuropathyand continued with Herceptin/Perjetaas maintenance therapy. -HerCT CAP from 3/3/21showed good treatment response.  -Per Dr. Burr Medico there is a chance she can proceed with right Lumpectomy and axillary LN dissection to reduce her tumor burden. She is open to this. If next PET scan 4 months later(01/2020) looks good will refer her to surgeon.  -Will continue current treatment as it is controlling her disease.trast-pertuz injection q3 weeks and letrozole daily   2.Peripheral neuropathy, grade 2, Right Foot pain -Secondarytochemotherapy,mainly in her feetand tingling in her fingers. -Abraxane was discontinued on 10/13/18. -She is currently on Oral B12,Gabapentin37m 5 tabs dailyand Tramadol1 tab every 1-2 nightsand low dose Cymbalta. -She will also continue OTC Calcium and Vit D. -She has tried PT twice and does not feel it helps as much as she does not feel like a fall risk. -tuning fork exam is normal today in her fingertips. She can increase 300 mg gabapentin up to 3 caps TID if needed for symptoms    3.Left UEDVT, Right LE edema -she developed LUE DVT on 08/04/2018, edema has resolved after anticoagulation -Continue onXarelto 244mindefinitely, unless she has significant bleeding or other side effects. -Shedeveloped unequal lower extremity edema, 10/13/18 doppler was negative for DVT. -she has cramps lately, but no recurrent leg edema. She is on xarelto, very low clinical suspicion for DVT. I encouraged her to hydrate.   4. Anemia -She developed worseningwhen she was on Chemo -improved since being off chemo -Continue monitoring, consider blood transfusion if hemoglobin < 8. -mild and stable   5Social support  -She lives alone, [place]  -On Ativan PRN due to anxietyabout diagnosis and treatment -Daughter was with her today   6. Goal of care discussion  -she is full code -She understands the goal of care is palliative, to prolong her life, and improve her quality of life.  7. Recurrentdyspnea and wheezing, Tightness of throat, Mild HAs -Followed by Dr. IcRolly Pancakeho feels her symptoms are mostly related to GERD. On prilosec 2 tabs BID which is helping  -for posterior HA and memory deficits and impaired speech she underwent brain MRI on 11/16/19 that was negative for malignancy but showed active bilateral  maxillary sinus disease -She went back to pulm for dyspnea last week, treated for bronchitis vs asthma with prednisone taper and inhaler. Dyspnea resolved.  -She does not c/o throat issues, HA, or dyspnea today. Continue supportive meds PRN    Disposition:  Ms. Spickard appears stable. She completed another cycle of pertuzumab-trastuzumab injection. She tolerates well overall without significant toxicities. She has leg cramps which might be related to hydration status and I have encouraged her to drink more liquids. No signs of leg edema or clinical concern for thrombosis. She continues  Xarelto. Will monitor.   Her dyspnea resolved with inhaler. She continues omeprazole 2 caps BID per GI. She is otherwise doing well. CBC and CMP reviewed, adequate for treatment. She will proceed with another cycle of pertuz-trastuz injection today. She will continue letrozole daily. She has an echo and cardiology f/u after today's appointment. She will return for f/u and injection in 3 weeks.  All questions were answered. The patient knows to call the clinic with any problems, questions or concerns. No barriers to learning was detected. Total encounter time was 30 minutes.      Alla Feeling, NP 12/12/19

## 2019-12-12 ENCOUNTER — Encounter: Payer: Self-pay | Admitting: Nurse Practitioner

## 2019-12-12 ENCOUNTER — Inpatient Hospital Stay: Payer: Medicaid Other

## 2019-12-12 ENCOUNTER — Inpatient Hospital Stay (HOSPITAL_BASED_OUTPATIENT_CLINIC_OR_DEPARTMENT_OTHER): Payer: Medicaid Other | Admitting: Nurse Practitioner

## 2019-12-12 ENCOUNTER — Other Ambulatory Visit: Payer: Self-pay

## 2019-12-12 ENCOUNTER — Other Ambulatory Visit: Payer: Medicaid Other

## 2019-12-12 ENCOUNTER — Ambulatory Visit (INDEPENDENT_AMBULATORY_CARE_PROVIDER_SITE_OTHER)
Admission: RE | Admit: 2019-12-12 | Discharge: 2019-12-12 | Disposition: A | Discharge status: Home / Self Care | Payer: Medicaid Other | Source: Ambulatory Visit | Attending: Internal Medicine | Admitting: Internal Medicine

## 2019-12-12 ENCOUNTER — Ambulatory Visit: Payer: Medicaid Other | Admitting: Nurse Practitioner

## 2019-12-12 ENCOUNTER — Ambulatory Visit (HOSPITAL_COMMUNITY)
Admission: RE | Admit: 2019-12-12 | Discharge: 2019-12-12 | Disposition: A | Discharge status: Home / Self Care | Payer: Medicaid Other | Source: Ambulatory Visit | Attending: Family Medicine | Admitting: Family Medicine

## 2019-12-12 ENCOUNTER — Ambulatory Visit: Payer: Medicaid Other

## 2019-12-12 VITALS — BP 138/89 | HR 65 | Temp 98.4°F | Resp 17 | Ht 64.0 in | Wt 136.6 lb

## 2019-12-12 VITALS — BP 139/79 | HR 68 | Temp 98.9°F | Resp 17 | Ht 64.0 in | Wt 136.6 lb

## 2019-12-12 VITALS — BP 128/68 | HR 74 | Wt 139.2 lb

## 2019-12-12 DIAGNOSIS — K219 Gastro-esophageal reflux disease without esophagitis: Secondary | ICD-10-CM | POA: Insufficient documentation

## 2019-12-12 DIAGNOSIS — Z170 Estrogen receptor positive status [ER+]: Secondary | ICD-10-CM | POA: Insufficient documentation

## 2019-12-12 DIAGNOSIS — C50919 Malignant neoplasm of unspecified site of unspecified female breast: Secondary | ICD-10-CM

## 2019-12-12 DIAGNOSIS — Z79899 Other long term (current) drug therapy: Secondary | ICD-10-CM | POA: Diagnosis not present

## 2019-12-12 DIAGNOSIS — Z86010 Personal history of colonic polyps: Secondary | ICD-10-CM | POA: Diagnosis not present

## 2019-12-12 DIAGNOSIS — Z7901 Long term (current) use of anticoagulants: Secondary | ICD-10-CM | POA: Diagnosis not present

## 2019-12-12 DIAGNOSIS — N270 Small kidney, unilateral: Secondary | ICD-10-CM | POA: Diagnosis not present

## 2019-12-12 DIAGNOSIS — G620 Drug-induced polyneuropathy: Secondary | ICD-10-CM | POA: Diagnosis not present

## 2019-12-12 DIAGNOSIS — I10 Essential (primary) hypertension: Secondary | ICD-10-CM | POA: Insufficient documentation

## 2019-12-12 DIAGNOSIS — Z86718 Personal history of other venous thrombosis and embolism: Secondary | ICD-10-CM | POA: Insufficient documentation

## 2019-12-12 DIAGNOSIS — Z5111 Encounter for antineoplastic chemotherapy: Secondary | ICD-10-CM | POA: Diagnosis not present

## 2019-12-12 DIAGNOSIS — C787 Secondary malignant neoplasm of liver and intrahepatic bile duct: Secondary | ICD-10-CM | POA: Insufficient documentation

## 2019-12-12 DIAGNOSIS — K8020 Calculus of gallbladder without cholecystitis without obstruction: Secondary | ICD-10-CM | POA: Diagnosis not present

## 2019-12-12 DIAGNOSIS — I700 Atherosclerosis of aorta: Secondary | ICD-10-CM | POA: Insufficient documentation

## 2019-12-12 DIAGNOSIS — T451X5A Adverse effect of antineoplastic and immunosuppressive drugs, initial encounter: Secondary | ICD-10-CM | POA: Diagnosis not present

## 2019-12-12 DIAGNOSIS — Z7189 Other specified counseling: Secondary | ICD-10-CM

## 2019-12-12 LAB — CMP (CANCER CENTER ONLY)
ALT: 24 U/L (ref 0–44)
AST: 20 U/L (ref 15–41)
Albumin: 3.5 g/dL (ref 3.5–5.0)
Alkaline Phosphatase: 129 U/L — ABNORMAL HIGH (ref 38–126)
Anion gap: 8 (ref 5–15)
BUN: 13 mg/dL (ref 8–23)
CO2: 24 mmol/L (ref 22–32)
Calcium: 9.4 mg/dL (ref 8.9–10.3)
Chloride: 106 mmol/L (ref 98–111)
Creatinine: 1.15 mg/dL — ABNORMAL HIGH (ref 0.44–1.00)
GFR, Est AFR Am: 59 mL/min — ABNORMAL LOW (ref >60)
GFR, Estimated: 51 mL/min — ABNORMAL LOW (ref >60)
Glucose, Bld: 115 mg/dL — ABNORMAL HIGH (ref 70–99)
Potassium: 4.3 mmol/L (ref 3.5–5.1)
Sodium: 138 mmol/L (ref 135–145)
Total Bilirubin: 0.4 mg/dL (ref 0.3–1.2)
Total Protein: 6.7 g/dL (ref 6.5–8.1)

## 2019-12-12 LAB — CBC WITH DIFFERENTIAL (CANCER CENTER ONLY)
Abs Immature Granulocytes: 0.02 10*3/uL (ref 0.00–0.07)
Basophils Absolute: 0 10*3/uL (ref 0.0–0.1)
Basophils Relative: 1 %
Eosinophils Absolute: 0.5 10*3/uL (ref 0.0–0.5)
Eosinophils Relative: 7 %
HCT: 32.4 % — ABNORMAL LOW (ref 36.0–46.0)
Hemoglobin: 10.3 g/dL — ABNORMAL LOW (ref 12.0–15.0)
Immature Granulocytes: 0 %
Lymphocytes Relative: 22 %
Lymphs Abs: 1.4 10*3/uL (ref 0.7–4.0)
MCH: 28.9 pg (ref 26.0–34.0)
MCHC: 31.8 g/dL (ref 30.0–36.0)
MCV: 90.8 fL (ref 80.0–100.0)
Monocytes Absolute: 0.3 10*3/uL (ref 0.1–1.0)
Monocytes Relative: 5 %
Neutro Abs: 4.2 10*3/uL (ref 1.7–7.7)
Neutrophils Relative %: 65 %
Platelet Count: 179 10*3/uL (ref 150–400)
RBC: 3.57 MIL/uL — ABNORMAL LOW (ref 3.87–5.11)
RDW: 14 % (ref 11.5–15.5)
WBC Count: 6.5 10*3/uL (ref 4.0–10.5)
nRBC: 0 % (ref 0.0–0.2)

## 2019-12-12 LAB — ECHOCARDIOGRAM COMPLETE
Height: 64 in
Weight: 2185.6 oz

## 2019-12-12 MED ORDER — HEPARIN SOD (PORK) LOCK FLUSH 100 UNIT/ML IV SOLN
500.0000 [IU] | Freq: Once | INTRAVENOUS | Status: AC | PRN
  Administered 2019-12-12: 500 [IU]
  Filled 2019-12-12: qty 5

## 2019-12-12 MED ORDER — DIPHENHYDRAMINE HCL 25 MG PO CAPS
ORAL_CAPSULE | ORAL | Status: AC
  Filled 2019-12-12: qty 1

## 2019-12-12 MED ORDER — GABAPENTIN 300 MG PO CAPS: ORAL_CAPSULE | ORAL | 3 refills | Status: DC

## 2019-12-12 MED ORDER — PERTUZ-TRASTUZ-HYALURON-ZZXF 60-60-2000 MG-MG-U/ML CHEMO ~~LOC~~ SOLN
10.0000 mL | Freq: Once | SUBCUTANEOUS | Status: AC
  Administered 2019-12-12: 10 mL via SUBCUTANEOUS
  Filled 2019-12-12: qty 10

## 2019-12-12 MED ORDER — OMEPRAZOLE 40 MG PO CPDR: 40.0000 mg | DELAYED_RELEASE_CAPSULE | Freq: Two times a day (BID) | ORAL | 3 refills | Status: DC

## 2019-12-12 MED ORDER — SODIUM CHLORIDE 0.9% FLUSH
10.0000 mL | INTRAVENOUS | Status: DC | PRN
  Administered 2019-12-12: 10 mL
  Filled 2019-12-12: qty 10

## 2019-12-12 MED ORDER — ACETAMINOPHEN 325 MG PO TABS
ORAL_TABLET | ORAL | Status: AC
  Filled 2019-12-12: qty 2

## 2019-12-12 MED ORDER — DIPHENHYDRAMINE HCL 25 MG PO CAPS
25.0000 mg | ORAL_CAPSULE | Freq: Once | ORAL | Status: AC
  Administered 2019-12-12: 25 mg via ORAL

## 2019-12-12 MED ORDER — ACETAMINOPHEN 325 MG PO TABS
650.0000 mg | ORAL_TABLET | Freq: Once | ORAL | Status: AC
  Administered 2019-12-12: 650 mg via ORAL

## 2019-12-12 MED FILL — OMEPRAZOLE 40 MG CPDR: 40 | 30 days supply | Qty: 60 | Fill #0

## 2019-12-12 MED FILL — GABAPENTIN 300 MG CAPSULE: 300 | 30 days supply | Qty: 270 | Fill #0

## 2019-12-12 NOTE — Patient Instructions (Signed)
[facility] [place] Discharge Instructions for Patients Receiving Chemotherapy  Today you received the following chemotherapy agent: PHESGO (trastuzumab/pertuzumab injection)  To help prevent nausea and vomiting after your treatment, we encourage you to take your nausea medication as prescribed.   If you develop nausea and vomiting that is not controlled by your nausea medication, call the clinic.   BELOW ARE SYMPTOMS THAT SHOULD BE REPORTED IMMEDIATELY:  *FEVER GREATER THAN 100.5 F  *CHILLS WITH OR WITHOUT FEVER  NAUSEA AND VOMITING THAT IS NOT CONTROLLED WITH YOUR NAUSEA MEDICATION  *UNUSUAL SHORTNESS OF BREATH  *UNUSUAL BRUISING OR BLEEDING  TENDERNESS IN MOUTH AND THROAT WITH OR WITHOUT PRESENCE OF ULCERS  *URINARY PROBLEMS  *BOWEL PROBLEMS  UNUSUAL RASH Items with * indicate a potential emergency and should be followed up as soon as possible.  Feel free to call the clinic should you have any questions or concerns. The clinic phone number is [phone].  Please show the [place] at check-in to the Emergency Department and triage nurse.      Pertuzumab injection What is this medicine? PERTUZUMAB (per TOOZ ue mab) is a monoclonal antibody. It is used to treat breast cancer. This medicine may be used for other purposes; ask your health care provider or pharmacist if you have questions. COMMON BRAND NAME(S): PERJETA What should I tell my health care provider before I take this medicine? They need to know if you have any of these conditions:  heart disease  heart failure  high blood pressure  history of irregular heart beat  recent or ongoing radiation therapy  an unusual or allergic reaction to pertuzumab, other medicines, foods, dyes, or preservatives  pregnant or trying to get pregnant  breast-feeding How should I use this medicine? This medicine is for infusion into a vein. It is given by a health care professional in a hospital or  clinic setting. Talk to your pediatrician regarding the use of this medicine in children. Special care may be needed. Overdosage: If you think you have taken too much of this medicine contact a poison control center or emergency room at once. NOTE: This medicine is only for you. Do not share this medicine with others. What if I miss a dose? It is important not to miss your dose. Call your doctor or health care professional if you are unable to keep an appointment. What may interact with this medicine? Interactions are not expected. Give your health care provider a list of all the medicines, herbs, non-prescription drugs, or dietary supplements you use. Also tell them if you smoke, drink alcohol, or use illegal drugs. Some items may interact with your medicine. This list may not describe all possible interactions. Give your health care provider a list of all the medicines, herbs, non-prescription drugs, or dietary supplements you use. Also tell them if you smoke, drink alcohol, or use illegal drugs. Some items may interact with your medicine. What should I watch for while using this medicine? Your condition will be monitored carefully while you are receiving this medicine. Report any side effects. Continue your course of treatment even though you feel ill unless your doctor tells you to stop. Do not become pregnant while taking this medicine or for 7 months after stopping it. Women should inform their doctor if they wish to become pregnant or think they might be pregnant. Women of child-bearing potential will need to have a negative pregnancy test before starting this medicine. There is a potential for serious side effects  to an unborn child. Talk to your health care professional or pharmacist for more information. Do not breast-feed an infant while taking this medicine or for 7 months after stopping it. Women must use effective birth control with this medicine. Call your doctor or health care  professional for advice if you get a fever, chills or sore throat, or other symptoms of a cold or flu. Do not treat yourself. Try to avoid being around people who are sick. You may experience fever, chills, and headache during the infusion. Report any side effects during the infusion to your health care professional. What side effects may I notice from receiving this medicine? Side effects that you should report to your doctor or health care professional as soon as possible:  breathing problems  chest pain or palpitations  dizziness  feeling faint or lightheaded  fever or chills  skin rash, itching or hives  sore throat  swelling of the face, lips, or tongue  swelling of the legs or ankles  unusually weak or tired Side effects that usually do not require medical attention (report to your doctor or health care professional if they continue or are bothersome):  diarrhea  hair loss  nausea, vomiting  tiredness This list may not describe all possible side effects. Call your doctor for medical advice about side effects. You may report side effects to FDA at 1-800-FDA-1088. Where should I keep my medicine? This drug is given in a hospital or clinic and will not be stored at home. NOTE: This sheet is a summary. It may not cover all possible information. If you have questions about this medicine, talk to your doctor, pharmacist, or health care provider.  2020 Elsevier/Gold Standard (2015-08-07 12:08:50)   Trastuzumab injection for infusion What is this medicine? TRASTUZUMAB (tras TOO zoo mab) is a monoclonal antibody. It is used to treat breast cancer and stomach cancer. This medicine may be used for other purposes; ask your health care provider or pharmacist if you have questions. COMMON BRAND NAME(S): Herceptin, Galvin Proffer, Trazimera What should I tell my health care provider before I take this medicine? They need to know if you have any of these  conditions:  heart disease  heart failure  lung or breathing disease, like asthma  an unusual or allergic reaction to trastuzumab, benzyl alcohol, or other medications, foods, dyes, or preservatives  pregnant or trying to get pregnant  breast-feeding How should I use this medicine? This drug is given as an infusion into a vein. It is administered in a hospital or clinic by a specially trained health care professional. Talk to your pediatrician regarding the use of this medicine in children. This medicine is not approved for use in children. Overdosage: If you think you have taken too much of this medicine contact a poison control center or emergency room at once. NOTE: This medicine is only for you. Do not share this medicine with others. What if I miss a dose? It is important not to miss a dose. Call your doctor or health care professional if you are unable to keep an appointment. What may interact with this medicine? This medicine may interact with the following medications:  certain types of chemotherapy, such as daunorubicin, doxorubicin, epirubicin, and idarubicin This list may not describe all possible interactions. Give your health care provider a list of all the medicines, herbs, non-prescription drugs, or dietary supplements you use. Also tell them if you smoke, drink alcohol, or use illegal drugs. Some items may  interact with your medicine. What should I watch for while using this medicine? Visit your doctor for checks on your progress. Report any side effects. Continue your course of treatment even though you feel ill unless your doctor tells you to stop. Call your doctor or health care professional for advice if you get a fever, chills or sore throat, or other symptoms of a cold or flu. Do not treat yourself. Try to avoid being around people who are sick. You may experience fever, chills and shaking during your first infusion. These effects are usually mild and can be treated  with other medicines. Report any side effects during the infusion to your health care professional. Fever and chills usually do not happen with later infusions. Do not become pregnant while taking this medicine or for 7 months after stopping it. Women should inform their doctor if they wish to become pregnant or think they might be pregnant. Women of child-bearing potential will need to have a negative pregnancy test before starting this medicine. There is a potential for serious side effects to an unborn child. Talk to your health care professional or pharmacist for more information. Do not breast-feed an infant while taking this medicine or for 7 months after stopping it. Women must use effective birth control with this medicine. What side effects may I notice from receiving this medicine? Side effects that you should report to your doctor or health care professional as soon as possible:  allergic reactions like skin rash, itching or hives, swelling of the face, lips, or tongue  chest pain or palpitations  cough  dizziness  feeling faint or lightheaded, falls  fever  general ill feeling or flu-like symptoms  signs of worsening heart failure like breathing problems; swelling in your legs and feet  unusually weak or tired Side effects that usually do not require medical attention (report to your doctor or health care professional if they continue or are bothersome):  bone pain  changes in taste  diarrhea  joint pain  nausea/vomiting  weight loss This list may not describe all possible side effects. Call your doctor for medical advice about side effects. You may report side effects to FDA at 1-800-FDA-1088. Where should I keep my medicine? This drug is given in a hospital or clinic and will not be stored at home. NOTE: This sheet is a summary. It may not cover all possible information. If you have questions about this medicine, talk to your doctor, pharmacist, or health care  provider.  2020 Elsevier/Gold Standard (2016-06-29 14:37:52)

## 2019-12-12 NOTE — Patient Instructions (Signed)
Your physician recommends that you schedule a follow-up appointment in: 3 months with echocardiogram  

## 2019-12-12 NOTE — Progress Notes (Signed)
Cardio-Oncology Clinic Note    Date:  12/12/2019   ID:  Teresa Hays, Teresa Hays [birth year], MRN [id number]  Location: Home  Teresa Hays location: [facility] Type of Visit: New patient  PCP:  [name], DO  Cardiologist:  No primary care Teresa Hays on file.  Referring: Dr. Burr Medico   History of Present Illness:  Teresa Hays is a 64 y/o woman with GERD, HTN and metastatic breast CA referred by Dr. Burr Medico for enrollment into the Cardio-Oncology program.   SUMMARY OF ONCOLOGIC HISTORY:     Oncology History   Cancer Staging Metastatic breast cancer [facility]) Staging form: Breast, AJCC 8th Edition - Clinical stage from 03/24/2018: Stage IV (cT2, cN1, pM1, G3, ER+, PR+, HER2+) - Signed by [name], MD on 03/30/2018       Metastatic breast cancer ([place])   01/30/2018 Procedure    Colonoscopy showed small polyp in the sigmoid colon, removed, the exam of colon including the terminal ileum was otherwise negative.    01/30/2018 Procedure    EGD by Dr. Collene Mares showed small hiatal hernia, a 8 mm polypoid lesion in the cardia, biopsied.    03/09/2018 Imaging    03/09/2018 US Abdomen IMPRESSION: 1. Mass lesions throughout the liver, consistent with metastatic disease. Liver as a somewhat nodular contour suggesting underlying hepatic cirrhosis. Inhomogeneous echotexture to the liver.  2. Cholelithiasis with mild gallbladder wall thickening. A degree of cholecystitis cannot be excluded by ultrasound.  3. Portions of pancreas obscured by gas. Visualized portions of pancreas appear normal.  4. Small right kidney. Etiology uncertain. This finding potentially may be indicative of renal artery stenosis. In this regard, question whether patient is hypertensive.    03/15/2018 Imaging    CT CAP with contrast  IMPRESSION: 1. Widespread hepatic metastasis. 2. 2.6 cm lateral right breast soft tissue nodule could represent a breast primary or an  incidental benign lesion. Consider correlation with mammogram and ultrasound. 3. No definite source of primary malignancy identified within the abdomen or pelvis. There is possible rectosigmoid junction wall thickening. Consider colonoscopy with attention to this area. 4. Distal esophageal wall thickening, suggesting esophagitis.    03/24/2018 Cancer Staging    Staging form: Breast, AJCC 8th Edition - Clinical stage from 03/24/2018: Stage IV (cT2, cN1, pM1, G3, ER+, PR+, HER2+) - Signed by [name], MD on 03/30/2018    03/24/2018 Initial Biopsy    Diagnosis 1. 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 Breast, right, needle core biopsy, 9 o'clock, 7cm from nipple - INVASIVE DUCTAL CARCINOMA. -The carcinoma is somewhat morphologically dissimilar from that in part 1. It appears grade III 3. Lymph node, needle/core biopsy, right axillary - METASTATIC CARCINOMA IN 1 OF 1 LYMPH NODE (1/1).    03/24/2018 Receptors her2    Breast biopsy: 1. Estrogen Receptor: 40%, POSITIVE, STRONG-MODERATE STAINING INTENSITY Progesterone Receptor: 70%, POSITIVE, STRONG STAINING INTENSITY Proliferation Marker Ki67: 20% HER 2 equivocal by IHC 2+, POSITIVE by FISH, ratio 2.4 and copy #4.2  2. Estrogen Receptor: 60%, POSITIVE, MODERATE STAINING INTENSITY Progesterone Receptor: 40%, POSITIVE, MODERATE STAINING INTENSITY Proliferation Marker Ki67: 20% HER2 (+) by IHC 3+    03/24/2018 Initial Diagnosis    Metastatic breast cancer ([place])    03/27/2018 Pathology Results    Diagnosis Liver, needle/core biopsy, Right - METASTATIC CARCINOMA TO LIVER, CONSISTENT WITH PATIENTS CLINICAL HISTORY OF PRIMARY BREAST CARCINOMA.  ER 80%+  PR40%+ HER2- (by [facility], IHC 2+)  Ki67 50%     03/28/2018 Pathology Results    03/28/2018 Surgical Pathology Diagnosis 1. Breast, left, needle core biopsy, 9 o'clock - FIBROCYSTIC  CHANGES. - THERE IS NO EVIDENCE OF MALIGNANCY. 2. Breast, left, needle core biopsy, 2 o'clock - FIBROADENOMA. - THERE IS NO EVIDENCE OF MALIGNANCY. - SEE COMMENT.    03/29/2018 Imaging    03/29/2018 Bone Scan IMPRESSION: No scintigraphic evidence of osseous metastatic disease.    03/30/2018 Imaging    Bone scan  IMPRESSION: No scintigraphic evidence of osseous metastatic disease.     04/07/2018 -  Chemotherapy    First line chemo weekly Taxol and herceptin/Perjeta every 3 weeks starting 04/07/18. She developed infusion reaction to taxol and it was discontinued. Added Abraxane on C1D8, 2 weeks on/1 week off.  Abraxne stopped after 10/13/18 due to worsening Neuropathy    06/06/2018 Imaging    CT CAP IMPRESSION: 1. Generally improved appearance, with reduced axillary adenopathy and reduced enhancing component of the hepatic masses, with some of the hepatic mass is moderately smaller than on the prior exam. Reduced size of the right lateral breast mass compared to the prior 03/15/2018 exam. 2. New mild interstitial accentuation in the lungs, significance uncertain. Part of this appearance may be due to lower lung volumes on today's exam. 3. Mild wall thickening in the descending colon and upper rectum suggesting low-grade colitis/inflammation. Prominent stool throughout the colon favors constipation. 4. Other imaging findings of potential clinical significance: Aortic Atherosclerosis (ICD10-I70.0). Mild cardiomegaly. Mild nodularity in the right lower lobe appears stable. Contracted and thick-walled gallbladder.     09/18/2018 Imaging    CT CAP W Contrast 09/18/18  IMPRESSION: 1. Liver metastases have decreased in size. 2. Right breast mass, mild right axillary lymphadenopathy and scattered tiny right pulmonary nodules are all stable. 3. New mild left supraclavicular and left subpectoral lymphadenopathy, can not exclude progression of metastatic nodal disease. 4.  Moderate colorectal stool volume, which may indicate constipation. 5. Aortic Atherosclerosis (ICD10-I70.0).    10/2018 -  Anti-estrogen oral therapy    Letrozole 2.5 mg daily starting 10/2018     She presents today for routine f/u. Doing well. No SOB, orthopnea or PND. No edema. Tolerating Herceptin well  Echo today EF 50-55% GLS -15.0% Personally reviewed  Echo 2/21 EF 55-60% GLS - 15.8%   Echo 11/20 EF 55% GLS -17.9% Personally reviewed  Echo 9/20 EF 50-55% GLS - 14.4% (underestimated)  Echo 02/27/19 EF 50% GLS -21.4%   Echo 07/28/18 EF 60-65% GLS -19.2% Echo 12/1018  EF 55-60% GLS -16.1%     Past Medical History:  Diagnosis Date  . GERD (gastroesophageal reflux disease)   . Hypertension   . rt breast ca with mets to liver dx'd 03/2018   Past Surgical History:  Procedure Laterality Date  . COLONOSCOPY    . ESOPHAGOGASTRODUODENOSCOPY ENDOSCOPY    . IR IMAGING GUIDED PORT INSERTION  04/04/2018     Current Outpatient Medications  Medication Sig Dispense Refill  . albuterol (VENTOLIN HFA) 108 (90 Base) MCG/ACT inhaler Inhale 1-2 puffs into the lungs every 6 (six) hours as needed for wheezing or shortness of breath.    Marland Kitchen amLODipine (NORVASC) 5 MG tablet Take 1 tablet (5 mg total) by mouth daily. 30 tablet 2  . Budeson-Glycopyrrol-Formoterol (BREZTRI AEROSPHERE) 160-9-4.8 MCG/ACT AERO Inhale 2 puffs into the lungs 2 (two) times daily. 11.8 g 0  . carvedilol (COREG) 3.125 MG tablet Take 1 tablet (3.125  mg total) by mouth 2 (two) times daily. 180 tablet 3  . diphenoxylate-atropine (LOMOTIL) 2.5-0.025 MG tablet Take 1-2 tablets by mouth 4 (four) times daily as needed for diarrhea or loose stools. 60 tablet 1  . DULoxetine (CYMBALTA) 20 MG capsule TAKE 1 CAPSULE BY MOUTH ONCE DAILY 30 capsule 1  . gabapentin (NEURONTIN) 300 MG capsule TAKE 1 CAPSULE BY MOUTH IN THE MORNING, 1 CAPSULE MIDDAY AND 3 CAPSULES AT BEDTIME 150 capsule 3  . letrozole (FEMARA) 2.5 MG tablet TAKE 1  TABLET (2.5 MG TOTAL) BY MOUTH DAILY. 90 tablet 1  . lidocaine-prilocaine (EMLA) cream Apply to affected area once 30 g 3  . losartan (COZAAR) 50 MG tablet Take 1 tablet (50 mg total) by mouth daily. 90 tablet 3  . omeprazole (PRILOSEC) 20 MG capsule TAKE 2 CAPSULES BY MOUTH TWO TIMES DAILY BEFORE A MEAL 60 capsule 2  . potassium chloride SA (KLOR-CON) 20 MEQ tablet TAKE 1 TABLET BY MOUTH ONCE DAILY 30 tablet 0  . rivaroxaban (XARELTO) 20 MG TABS tablet TAKE 1 TABLET BY MOUTH DAILY WITH SUPPER 30 tablet 5  . spironolactone (ALDACTONE) 25 MG tablet Take 1 tablet (25 mg total) by mouth daily. 30 tablet 6  . traMADol (ULTRAM) 50 MG tablet Take 1 tablet (50 mg total) by mouth every 6 (six) hours as needed for moderate pain or severe pain. 30 tablet 0   No current facility-administered medications for this encounter.    Allergies:   Patient has no known allergies.   Social History:  The patient  reports that she has never smoked. She has never used smokeless tobacco. She reports that she does not drink alcohol or use drugs.   Family History:  The patient's family history includes Cancer (age of onset: 80) in her sister; Diabetes in her mother; Rheum arthritis in her sister.   ROS:  Please see the history of present illness.   All other systems are personally reviewed and negative.     Vitals:   12/12/19 1223  BP: 128/68  Pulse: 74  SpO2: 98%  Weight: 63.2 kg (139 lb 4 oz)     Exam:   General:  Well appearing. No resp difficulty HEENT: normal Neck: supple. no JVD. Carotids 2+ bilat; no bruits. No lymphadenopathy or thryomegaly appreciated. Cor: PMI nondisplaced. Regular rate & rhythm. No rubs, gallops or murmurs. + left Port Lungs: clear Abdomen: soft, nontender, nondistended. No hepatosplenomegaly. No bruits or masses. Good bowel sounds. Extremities: no cyanosis, clubbing, rash, edema Neuro: alert & orientedx3, cranial nerves grossly intact. moves all 4 extremities w/o difficulty.  Affect pleasant   Recent Labs: 05/29/2019: B Natriuretic Peptide 9.3 12/12/2019: ALT 24; BUN 13; Creatinine 1.15; Hemoglobin 10.3; Platelet Count 179; Potassium 4.3; Sodium 138  Personally reviewed   Wt Readings from Last 3 Encounters:  12/12/19 63.2 kg (139 lb 4 oz)  12/12/19 62 kg (136 lb 9.6 oz)  12/12/19 62 kg (136 lb 9.6 oz)      ASSESSMENT AND PLAN:  1. Breast cancer, metastatic - She has underwent first-line weekly TaxolwithHerceptin/Perjeta every 3 weeks starting 04/07/18. She developed infusion reaction to taxol and it was discontinued. Changed to Abraxaneweekly2 weeks on/1 week offfrom 07/21/2018.Abraxane held after October 13, 2018 due to his worsening neuropathy. Remains on Herceptin/Perjeta. Now on Letrozole 2.5 mg daily starting 10/2018 - Echo 8/20 EF down to 45-50% suspicious for mild Herceptin cardiotoxicity.  - Herceptin/perjeta held  for one dose.  - Echo 9/20 EF 50-55%  -  Echo 05/29/19 EF stable at 55% GLS -17.9 % - Echo 2/21  EF 55-60% GLS -15.8 - Echo today EF 50-55% GLS -15.0 - Overall I think EF remains stable despite ongoing Herceptin therapy. I do not see any convincing evidence of cardiac toxicity - Continue carvedilol 3.125 bid - Continue losartan '50mg'$  daily - spiro 25 - Continue herceptin/perjeta with close f/u as ideally will need life-long therapy - EF stable. Will cotninue to follow every 3 months for now, As we get farther out can continue going out to 6 months if EF remains stable  - Repeat echo 3 months   2. HTN  - Blood pressure well controlled. Continue current regimen.  3. RUE DVT - continue Xarelto. No bleeding    Glori Bickers, MD  12/12/2019 12:56 PM  [facility] [place] [name] [facility] [place] [address] [phone] (office) [phone] (fax)

## 2019-12-13 ENCOUNTER — Telehealth: Payer: Self-pay | Admitting: Nurse Practitioner

## 2019-12-13 LAB — CANCER ANTIGEN 27.29: CA 27.29: 48.3 U/mL — ABNORMAL HIGH (ref 0.0–38.6)

## 2019-12-13 NOTE — Telephone Encounter (Signed)
Scheduled appt per 5/26 los.  Spoke with pt and they are aware of their scheduled appt date and time.

## 2019-12-14 ENCOUNTER — Encounter: Payer: Self-pay | Admitting: *Deleted

## 2019-12-14 NOTE — Progress Notes (Signed)
Received forms in mail for daughter, Shellee Milo to extend her FMLA to care for her mother. Forwarded to [place], Therapist, sports /

## 2019-12-15 ENCOUNTER — Other Ambulatory Visit (HOSPITAL_COMMUNITY): Payer: Self-pay | Admitting: Internal Medicine

## 2019-12-15 MED FILL — XARELTO 20 MG TABLET: 20 | 30 days supply | Qty: 30 | Fill #1

## 2019-12-18 ENCOUNTER — Other Ambulatory Visit: Payer: Self-pay | Admitting: Nurse Practitioner

## 2019-12-18 ENCOUNTER — Other Ambulatory Visit: Payer: Self-pay | Admitting: Hematology

## 2019-12-18 DIAGNOSIS — D649 Anemia, unspecified: Secondary | ICD-10-CM

## 2019-12-18 MED ORDER — TRAMADOL HCL 50 MG PO TABS: 50.0000 mg | ORAL_TABLET | Freq: Four times a day (QID) | ORAL | 0 refills | Status: DC | PRN

## 2019-12-18 MED FILL — SPIRONOLACTONE 25 MG TABS: 25 | 90 days supply | Qty: 90 | Fill #0

## 2019-12-19 MED FILL — traMADol HCL 50 MG TABS: 50 | 7 days supply | Qty: 30 | Fill #0

## 2019-12-21 ENCOUNTER — Telehealth: Payer: Self-pay | Admitting: Hematology

## 2019-12-21 NOTE — Telephone Encounter (Signed)
Release I.d: [phone] Faxed medical records to [facility] @ [phone]

## 2019-12-24 ENCOUNTER — Other Ambulatory Visit: Payer: Self-pay

## 2019-12-24 ENCOUNTER — Other Ambulatory Visit: Payer: Self-pay | Admitting: Hematology

## 2019-12-24 DIAGNOSIS — C50919 Malignant neoplasm of unspecified site of unspecified female breast: Secondary | ICD-10-CM

## 2019-12-24 MED ORDER — DULOXETINE HCL 20 MG PO CPEP: 20.0000 mg | ORAL_CAPSULE | Freq: Every day | ORAL | 2 refills | Status: DC

## 2020-01-01 NOTE — Progress Notes (Signed)
Teresa Hays   [phone] [phone]   Clinic Follow up Note   Patient Care Team: [name], DO as PCP - General (Family Medicine) [name], MD as Consulting Physician (Gastroenterology) [name], MD as Consulting Physician (Hematology)  Date of Service:  01/04/2020  CHIEF COMPLAINT: F/u for metastatic breast cancer  SUMMARY OF ONCOLOGIC HISTORY: Oncology History Overview Note  Cancer Staging Metastatic breast cancer [facility]) Staging form: Breast, AJCC 8th Edition - Clinical stage from 03/24/2018: Stage IV (cT2, cN1, pM1, G3, ER+, PR+, HER2+) - Signed by [name], MD on 03/30/2018     Metastatic breast cancer ([name])  01/30/2018 Procedure   Colonoscopy showed small polyp in the sigmoid colon, removed, the exam of colon including the terminal ileum was otherwise negative.   01/30/2018 Procedure   EGD by Dr. Collene Mares showed small hiatal hernia, a 8 mm polypoid lesion in the cardia, biopsied.   03/09/2018 Imaging   03/09/2018 US Abdomen IMPRESSION: 1. Mass lesions throughout the liver, consistent with metastatic disease. Liver as a somewhat nodular contour suggesting underlying hepatic cirrhosis. Inhomogeneous echotexture to the liver.  2. Cholelithiasis with mild gallbladder wall thickening. A degree of cholecystitis cannot be excluded by ultrasound.  3. Portions of pancreas obscured by gas. Visualized portions of pancreas appear normal.  4. Small right kidney. Etiology uncertain. This finding potentially may be indicative of renal artery stenosis. In this regard, question whether patient is hypertensive.   03/15/2018 Imaging   CT CAP with contrast  IMPRESSION: 1. Widespread hepatic metastasis. 2. 2.6 cm lateral right breast soft tissue nodule could represent a breast primary or an incidental benign lesion. Consider correlation with mammogram and ultrasound. 3. No definite source of primary malignancy identified within the abdomen or  pelvis. There is possible rectosigmoid junction wall thickening. Consider colonoscopy with attention to this area. 4. Distal esophageal wall thickening, suggesting esophagitis.   03/24/2018 Cancer Staging   Staging form: Breast, AJCC 8th Edition - Clinical stage from 03/24/2018: Stage IV (cT2, cN1, pM1, G3, ER+, PR+, HER2+) - Signed by [name], MD on 03/30/2018   03/24/2018 Initial Biopsy   Diagnosis 1. 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 Breast, right, needle core biopsy, 9 o'clock, 7cm from nipple - INVASIVE DUCTAL CARCINOMA. -The carcinoma is somewhat morphologically dissimilar from that in part 1. It appears grade III 3. Lymph node, needle/core biopsy, right axillary - METASTATIC CARCINOMA IN 1 OF 1 LYMPH NODE (1/1).   03/24/2018 Receptors her2   Breast biopsy: 1. Estrogen Receptor: 40%, POSITIVE, STRONG-MODERATE STAINING INTENSITY Progesterone Receptor: 70%, POSITIVE, STRONG STAINING INTENSITY Proliferation Marker Ki67: 20% HER 2 equivocal by IHC 2+, POSITIVE by FISH, ratio 2.4 and copy #4.2  2. Estrogen Receptor: 60%, POSITIVE, MODERATE STAINING INTENSITY Progesterone Receptor: 40%, POSITIVE, MODERATE STAINING INTENSITY Proliferation Marker Ki67: 20% HER2 (+) by IHC 3+   03/24/2018 Initial Diagnosis   Metastatic breast cancer ([name])   03/27/2018 Pathology Results   Diagnosis Liver, needle/core biopsy, Right - METASTATIC CARCINOMA TO LIVER, CONSISTENT WITH PATIENTS CLINICAL HISTORY OF PRIMARY BREAST CARCINOMA.  ER 80%+ PR40%+ HER2- (by [facility], IHC 2+)  Ki67 50%    03/28/2018 Pathology Results   03/28/2018 Surgical Pathology Diagnosis 1. Breast, left, needle core biopsy, 9 o'clock - FIBROCYSTIC CHANGES. - THERE IS NO EVIDENCE OF MALIGNANCY. 2. Breast, left, needle core biopsy, 2 o'clock - FIBROADENOMA. - THERE IS NO EVIDENCE OF  MALIGNANCY. - SEE COMMENT.   03/29/2018 Imaging    03/29/2018 Bone Scan IMPRESSION: No scintigraphic evidence of osseous metastatic disease.   03/30/2018 Imaging   Bone scan  IMPRESSION: No scintigraphic evidence of osseous metastatic disease.    04/07/2018 -  Chemotherapy   First line chemo weekly Taxol and herceptin/Perjeta every 3 weeks starting 04/07/18. She developed infusion reaction to taxol and it was discontinued. Added Abraxane on C1D8, 2 weeks on/1 week off.  Abraxne stopped after 10/13/18 due to worsening Neuropathy. She has continued with maintenance Herceptin injection/Perjeta q3weeks. Switched to Herceptin injections on 04/06/19.    06/06/2018 Imaging   CT CAP IMPRESSION: 1. Generally improved appearance, with reduced axillary adenopathy and reduced enhancing component of the hepatic masses, with some of the hepatic mass is moderately smaller than on the prior exam. Reduced size of the right lateral breast mass compared to the prior 03/15/2018 exam. 2. New mild interstitial accentuation in the lungs, significance uncertain. Part of this appearance may be due to lower lung volumes on today's exam. 3. Mild wall thickening in the descending colon and upper rectum suggesting low-grade colitis/inflammation. Prominent stool throughout the colon favors constipation. 4. Other imaging findings of potential clinical significance: Aortic Atherosclerosis (ICD10-I70.0). Mild cardiomegaly. Mild nodularity in the right lower lobe appears stable. Contracted and thick-walled gallbladder.    09/18/2018 Imaging   CT CAP W Contrast 09/18/18  IMPRESSION: 1. Liver metastases have decreased in size. 2. Right breast mass, mild right axillary lymphadenopathy and scattered tiny right pulmonary nodules are all stable. 3. New mild left supraclavicular and left subpectoral lymphadenopathy, can not exclude progression of metastatic nodal disease. 4. Moderate colorectal stool volume, which may indicate constipation. 5.  Aortic Atherosclerosis  (ICD10-I70.0).   10/2018 -  Anti-estrogen oral therapy   Letrozole 2.5 mg daily starting 10/2018   01/10/2019 Imaging   CT CAP W Contrast 01/10/19  IMPRESSION: 1. Continued improvement in the hepatic metastatic lesions which have reduced in size. 2. Stable mild left supraclavicular and subpectoral adenopathy. 3. Essentially stable small right lower lobe pulmonary nodule and separate small subpleural nodule along the right hemidiaphragm. Surveillance suggested. 4. Other imaging findings of potential clinical significance: Mild cardiomegaly. Mild circumferential distal esophageal wall thickening, the most common cause would be esophagitis. Airway thickening is present, suggesting bronchitis or reactive airways disease. Airway plugging in the lower lobes and in the right middle lobe. Stable small right adrenal adenoma.   05/15/2019 Imaging   CT CAP W Contrast 05/15/19  IMPRESSION: CT CHEST IMPRESSION   1. Similar appearance of borderline supraclavicular, axillary, and subpectoral adenopathy. 2. Improved and resolved right lower lobe pulmonary nodularity. 3. Esophageal air fluid level suggests dysmotility or gastroesophageal reflux.   CT ABDOMEN AND PELVIS IMPRESSION   1. Improved hepatic metastasis. 2. Small bowel mesenteric lymph nodes which are upper normal and mildly enlarged. Likely increased and similar as detailed above. Indeterminate. Recommend attention on follow-up. 3. Cholelithiasis. 4. Motion degradation throughout the lower chest and abdomen.   09/19/2019 Imaging   CT CAP W contrast  IMPRESSION: 1. Interval decrease in size of right axillary and subpectoral lymph nodes. There are no pathologically enlarged lymph nodes remaining in the chest, abdomen, or pelvis. No new lymphadenopathy. 2. No significant change in post treatment appearance of multiple low-attenuation liver lesions, in keeping with treated metastases. 3. No evidence of new metastatic disease in the  chest, abdomen, or pelvis. 4. Aortic Atherosclerosis (ICD10-I70.0).   11/16/2019 Imaging   IM[facility] Brain  MPRESSION:  Minimal chronic microvascular ischemic changes. No acute intracranial process.   Active bilateral maxillary sinus disease with mild frontoethmoid mucosal thickening.      CURRENT THERAPY:  -First line weekly TaxolwithHerceptin/Perjeta every 3 weeks starting 04/07/18. She developed infusion reaction to taxol and it was discontinued. Changed to Abraxaneweekly2 weeks on/1 week offfrom 07/21/2018.Abraxane held after October 13, 2018 due to his worsening neuropathy.She will continue Herceptin injection/Perjeta q3weeksmaintenance therapy.Herceptin switched to injection on 04/06/19. -Letrozole 2.5 mg daily starting 10/2018  INTERVAL HISTORY:  Teresa Hays is here for a follow up. She presents to the clinic with her daughter. She notes with prior breathing issues, she was seen by pulmonary clinic. She was told to stop orange juice as this was related to her acid reflux. She notes her labored breathing has recurred starting last week. She has not followed up with pulmonologist given she was recommended to f/u with Dr Collene Mares. She will call her soon. She also uses her inhaler. She notes mid left breast pain close to nipple area which started in the past few days. She does not feel any mass. She notes her neuropathy is stable even on increased Gabapentin. Her tingling is mainly at night. She is also on Cymbalta.     REVIEW OF SYSTEMS:   Constitutional: Denies fevers, chills or abnormal weight loss Eyes: Denies blurriness of vision Ears, nose, mouth, throat, and face: Denies mucositis or sore throat Respiratory: Denies cough (+) wheezes, SOB Cardiovascular: Denies palpitation, chest discomfort or lower extremity swelling Gastrointestinal:  Denies nausea or change in bowel habits (+) Acid reflux  Skin: Denies abnormal skin rashes Lymphatics: Denies new lymphadenopathy or easy  bruising Neurological: (+) Stable neuropathy, mainly with tingling in feet Behavioral/Psych: Mood is stable, no new changes  Breast: (+) Mid left breast pain  All other systems were reviewed with the patient and are negative.  MEDICAL HISTORY:  Past Medical History:  Diagnosis Date  . GERD (gastroesophageal reflux disease)   . Hypertension   . rt breast ca with mets to liver dx'd 03/2018    SURGICAL HISTORY: Past Surgical History:  Procedure Laterality Date  . COLONOSCOPY    . ESOPHAGOGASTRODUODENOSCOPY ENDOSCOPY    . IR IMAGING GUIDED PORT INSERTION  04/04/2018    I have reviewed the social history and family history with the patient and they are unchanged from previous note.  ALLERGIES:  has No Known Allergies.  MEDICATIONS:  Current Outpatient Medications  Medication Sig Dispense Refill  . albuterol (VENTOLIN HFA) 108 (90 Base) MCG/ACT inhaler Inhale 1-2 puffs into the lungs every 6 (six) hours as needed for wheezing or shortness of breath.    Marland Kitchen amLODipine (NORVASC) 5 MG tablet Take 1 tablet (5 mg total) by mouth daily. 30 tablet 2  . carvedilol (COREG) 3.125 MG tablet Take 1 tablet (3.125 mg total) by mouth 2 (two) times daily. 180 tablet 3  . diphenoxylate-atropine (LOMOTIL) 2.5-0.025 MG tablet Take 1-2 tablets by mouth 4 (four) times daily as needed for diarrhea or loose stools. 60 tablet 1  . DULoxetine (CYMBALTA) 20 MG capsule Take 1 capsule (20 mg total) by mouth daily. 30 capsule 2  . gabapentin (NEURONTIN) 300 MG capsule May take up to 3 capsules three times daily 270 capsule 3  . letrozole (FEMARA) 2.5 MG tablet TAKE 1 TABLET (2.5 MG TOTAL) BY MOUTH DAILY. 90 tablet 1  . lidocaine-prilocaine (EMLA) cream Apply to affected area once 30 g 3  . losartan (COZAAR) 50 MG tablet Take 1  tablet (50 mg total) by mouth daily. 90 tablet 3  . omeprazole (PRILOSEC) 40 MG capsule Take 1 capsule (40 mg total) by mouth 2 (two) times daily. 60 capsule 3  . potassium chloride SA  (KLOR-CON) 20 MEQ tablet TAKE 1 TABLET BY MOUTH ONCE DAILY 30 tablet 0  . predniSONE (DELTASONE) 10 MG tablet 4 tabs for 2 days, then 3 tabs for 2 days, 2 tabs for 2 days, then 1 tab for 2 days, then stop 20 tablet 0  . rivaroxaban (XARELTO) 20 MG TABS tablet TAKE 1 TABLET BY MOUTH DAILY WITH SUPPER 30 tablet 5  . spironolactone (ALDACTONE) 25 MG tablet TAKE 1 TABLET BY MOUTH ONCE DAILY 90 tablet 3  . traMADol (ULTRAM) 50 MG tablet Take 1 tablet (50 mg total) by mouth every 6 (six) hours as needed for moderate pain or severe pain. 30 tablet 0   No current facility-administered medications for this visit.    PHYSICAL EXAMINATION: ECOG PERFORMANCE STATUS: 2 - Symptomatic, <50% confined to bed  Vitals:   01/04/20 0823  BP: (!) 148/91  Pulse: 73  Resp: 18  Temp: 97.9 F (36.6 C)  SpO2: 98%   Filed Weights   01/04/20 0823  Weight: 140 lb 11.2 oz (63.8 kg)    GENERAL:alert, no distress and comfortable SKIN: skin color, texture, turgor are normal, no rashes or significant lesions EYES: normal, Conjunctiva are pink and non-injected, sclera clear  NECK: supple, thyroid normal size, non-tender, without nodularity LYMPH:  no palpable lymphadenopathy in the cervical, axillary  LUNGS: clear to auscultation and percussion (+) Wheezes HEART: regular rate & rhythm and no murmurs and no lower extremity edema ABDOMEN:abdomen soft, non-tender and normal bowel sounds Musculoskeletal:no cyanosis of digits and no clubbing  NEURO: alert & oriented x 3 with fluent speech, no focal motor/sensory deficits BREAST: No palpable mass, nodules or adenopathy bilaterally.   LABORATORY DATA:  I have reviewed the data as listed CBC Latest Ref Rng & Units 01/04/2020 12/12/2019 11/23/2019  WBC 4.0 - 10.5 K/uL 6.6 6.5 9.3  Hemoglobin 12.0 - 15.0 g/dL 11.0(L) 10.3(L) 12.1  Hematocrit 36 - 46 % 34.0(L) 32.4(L) 37.4  Platelets 150 - 400 K/uL 225 179 223     CMP Latest Ref Rng & Units 01/04/2020 12/12/2019 11/23/2019   Glucose 70 - 99 mg/dL 119(H) 115(H) 111(H)  BUN 8 - 23 mg/dL '16 13 11  '$ Creatinine 0.44 - 1.00 mg/dL 1.33(H) 1.15(H) 1.29(H)  Sodium 135 - 145 mmol/L 137 138 141  Potassium 3.5 - 5.1 mmol/L 4.0 4.3 4.2  Chloride 98 - 111 mmol/L 107 106 105  CO2 22 - 32 mmol/L 20(L) 24 23  Calcium 8.9 - 10.3 mg/dL 9.6 9.4 9.9  Total Protein 6.5 - 8.1 g/dL 7.2 6.7 8.1  Total Bilirubin 0.3 - 1.2 mg/dL 0.3 0.4 0.4  Alkaline Phos 38 - 126 U/L 150(H) 129(H) 171(H)  AST 15 - 41 U/L '24 20 25  '$ ALT 0 - 44 U/L '15 24 18      '$ RADIOGRAPHIC STUDIES: I have personally reviewed the radiological images as listed and agreed with the findings in the report. No results found.   ASSESSMENT & PLAN:  HETVI SHAWHAN is a 64 y.o. female with    1. Metastatic right breast cancer to liver,TN, stage IV, ER+/PR+/HER2+, Liver mets ER+/PR+/HER2- -She was diagnosed in 03/2018.She presented with diffuse liver metastasis, tworight breast massesand right axillary adenopathy.Breast mass biopsy showed ER PR positive, but 1 was HER-2 positive, the other  one was HER-2 negative. Liver met was HER2-.  -Given the metastatic disease, her cancer is not curable but still treatable. Shehas been treated withLetrozole daily andFirst-line Herceptin/Perjetaevery 3 weeks and weekly Taxol. Due to infusionreaction, taxol was switched to Abraxane.We stoppedAbraxaneafter cycle 10 due to worsening neuropathyand continued with Herceptin/Perjetaas maintenance therapy.Herceptin changed to injection on 04/06/19.  -I previously discussed there is a chance she can proceed with right Lumpectomy and axillary LN dissection to reduce her tumor burden. She is open to this. If next PET scan 4 months later(01/2020) looks good will refer her to surgeon.  -She notes this week she started having mid left breast pain, no tenderness or palpable mass on exam. Will monitor. She is having another wheezing flare from her Acid Reflux. We reviewed management.   -Labs reviewed, Hg 11. Overall adequate to proceed with Herceptin injection/Perjeta today. Continue every 3 weeks.  -Continue Letrozole  -f/u in 3 weeks with next CT CAP a few days before    2.Peripheral neuropathy, grade 2, Right Foot pain -Secondarytochemotherapy,mainly in her feetand tingling in her fingers. -Ipreviouslystropped Abraxane on 10/13/18. -She will also continue OTC Calcium and Vit D. -She has tried PT twice and does not feel it helps as much as she does not feel like a fall risk. -Her main pain is inthe heal and ball of herright foot. She has received steroid injectionsto right foot from podiatrist which has improved her pain. Her podiatrist feels her pain is related to her toes and will continue to f/u with him for more work up.  -She overall still has adequate functions and ambulation. She is currently on Oral B12, Gabapentin 300 3 TID. Will increase Cymbalta to '40mg'$  (01/04/20)   4.Left UEDVT, Right LE edema(Resolved) -she developed LUE DVT on 08/04/2018, edema has resolved after anticoagulation -Continue onXarelto '20mg'$  indefinitely, unless she has significant bleeding or other side effects. -Shepreviouslydeveloped LE edema, right significantly more than left.10/13/18 doppler was negative for DVT. -She waspreviouslytreated with Lasix.Right LE edema resolved.  5. Anemia -She developed worseningwhen she was on Chemo, now she is off. -Continue monitoring, will consider blood transfusion if hemoglobin less than 8. -Mild and stable.  6Social support  -She lives alone, has her sister and niece in [place]. Her daughter lives in a few hours away  -On Ativan PRN due to anxietyabout diagnosis and treatment  7. Goal of care discussion  -she is full code -She understands the goal of care is palliative, to prolong her life, and improve her quality of life.  8. Recurrentdyspnea and wheezing, Acid Reflux, Tightness of throat, Mild  HAs -She has been seen by Dr. Valeta Harms who notes this is related to her post nasal drip and her Acid reflux can contribute. Herstarted her onPrilosecis taken 2 tabs BID and nasal spray. -This has recurred again with more wheezing at night and mild yellowish phlegm with productive cough. She notes it has not been easy to swallow occasionally.  -She notes she has been having mild posterior HA's and effects on her memory. She also appears to have mild trouble in her speech at last visit.  -I previously started her on Dexilant(11/02/19), but denied by insurance.  -Her Brain MRI from 11/16/19 was negative for malignancy but has active b/l maxillary sinus disease.  -She only feels congestion in her throat. Her cough has improved.  -I also recommend she use Nasal Flonase and continue allergy medication 1-2 times a day.  -She is having another wheezing flare since last week, triggered be  her acid reflux. Wheezes heard on exam today (01/04/20). She has stopped taking orange juice again. I instructed her to f/u with Dr Valeta Harms and Dr Collene Mares.  -I will call in tapering dose Prednisone from '40mg'$ . She will continue inhaler, she knows to call pulmonary clinic if her symptoms persists by early next week    Plan: -I called in tapering dose Prednisone today  -Labs reviewed and adequate to proceed with Herceptin injection/Perjeta today  -continue letrozole  -f/u and Herceptin injection/Perjeta in 3 weeks with lab, flush and CT CAP w contrast a few days before   No problem-specific Assessment & Plan notes found for this encounter.   Orders Placed This Encounter  Procedures  . CT Abdomen Pelvis W Contrast    Standing Status:   Future    Standing Expiration Date:   01/03/2021    Order Specific Question:   If indicated for the ordered procedure, I authorize the administration of contrast media per Radiology protocol    Answer:   Yes    Order Specific Question:   Preferred imaging location?    Answer:   [facility]    Order Specific Question:   Is Oral Contrast requested for this exam?    Answer:   Yes, Per Radiology protocol    Order Specific Question:   Radiology Contrast Protocol - do NOT remove file path    Answer:   \\[email or web address]  . CT Chest W Contrast    Standing Status:   Future    Standing Expiration Date:   01/03/2021    Order Specific Question:   If indicated for the ordered procedure, I authorize the administration of contrast media per Radiology protocol    Answer:   Yes    Order Specific Question:   Preferred imaging location?    Answer:   [facility]    Order Specific Question:   Radiology Contrast Protocol - do NOT remove file path    Answer:   \\[email or web address]   All questions were answered. The patient knows to call the clinic with any problems, questions or concerns. No barriers to learning was detected. The total time spent in the appointment was 30 minutes.     [name], MD 01/04/2020   I, Joslyn Devon, am acting as scribe for [name], MD.   I have reviewed the above documentation for accuracy and completeness, and I agree with the above.

## 2020-01-04 ENCOUNTER — Inpatient Hospital Stay (HOSPITAL_BASED_OUTPATIENT_CLINIC_OR_DEPARTMENT_OTHER): Payer: Medicaid Other | Admitting: Hematology

## 2020-01-04 ENCOUNTER — Inpatient Hospital Stay: Payer: Medicaid Other

## 2020-01-04 ENCOUNTER — Encounter: Payer: Self-pay | Admitting: Hematology

## 2020-01-04 ENCOUNTER — Inpatient Hospital Stay: Payer: Medicaid Other | Attending: Hematology

## 2020-01-04 ENCOUNTER — Other Ambulatory Visit: Payer: Self-pay

## 2020-01-04 VITALS — BP 148/91 | HR 73 | Temp 97.9°F | Resp 18 | Wt 140.7 lb

## 2020-01-04 DIAGNOSIS — Z7952 Long term (current) use of systemic steroids: Secondary | ICD-10-CM | POA: Diagnosis not present

## 2020-01-04 DIAGNOSIS — I700 Atherosclerosis of aorta: Secondary | ICD-10-CM | POA: Insufficient documentation

## 2020-01-04 DIAGNOSIS — I6782 Cerebral ischemia: Secondary | ICD-10-CM | POA: Insufficient documentation

## 2020-01-04 DIAGNOSIS — Z86718 Personal history of other venous thrombosis and embolism: Secondary | ICD-10-CM | POA: Insufficient documentation

## 2020-01-04 DIAGNOSIS — Z170 Estrogen receptor positive status [ER+]: Secondary | ICD-10-CM | POA: Diagnosis not present

## 2020-01-04 DIAGNOSIS — R064 Hyperventilation: Secondary | ICD-10-CM | POA: Diagnosis not present

## 2020-01-04 DIAGNOSIS — N644 Mastodynia: Secondary | ICD-10-CM | POA: Diagnosis not present

## 2020-01-04 DIAGNOSIS — M79671 Pain in right foot: Secondary | ICD-10-CM | POA: Diagnosis not present

## 2020-01-04 DIAGNOSIS — C787 Secondary malignant neoplasm of liver and intrahepatic bile duct: Secondary | ICD-10-CM | POA: Diagnosis not present

## 2020-01-04 DIAGNOSIS — Z7189 Other specified counseling: Secondary | ICD-10-CM

## 2020-01-04 DIAGNOSIS — C50919 Malignant neoplasm of unspecified site of unspecified female breast: Secondary | ICD-10-CM

## 2020-01-04 DIAGNOSIS — N270 Small kidney, unilateral: Secondary | ICD-10-CM | POA: Insufficient documentation

## 2020-01-04 DIAGNOSIS — G620 Drug-induced polyneuropathy: Secondary | ICD-10-CM | POA: Diagnosis not present

## 2020-01-04 DIAGNOSIS — K2100 Gastro-esophageal reflux disease with esophagitis, without bleeding: Secondary | ICD-10-CM | POA: Diagnosis not present

## 2020-01-04 DIAGNOSIS — D649 Anemia, unspecified: Secondary | ICD-10-CM | POA: Insufficient documentation

## 2020-01-04 DIAGNOSIS — Z95828 Presence of other vascular implants and grafts: Secondary | ICD-10-CM

## 2020-01-04 DIAGNOSIS — R0602 Shortness of breath: Secondary | ICD-10-CM

## 2020-01-04 DIAGNOSIS — Z79899 Other long term (current) drug therapy: Secondary | ICD-10-CM | POA: Diagnosis not present

## 2020-01-04 DIAGNOSIS — H40033 Anatomical narrow angle, bilateral: Secondary | ICD-10-CM | POA: Diagnosis not present

## 2020-01-04 DIAGNOSIS — R062 Wheezing: Secondary | ICD-10-CM | POA: Diagnosis not present

## 2020-01-04 DIAGNOSIS — R05 Cough: Secondary | ICD-10-CM | POA: Diagnosis not present

## 2020-01-04 DIAGNOSIS — D3501 Benign neoplasm of right adrenal gland: Secondary | ICD-10-CM | POA: Diagnosis not present

## 2020-01-04 DIAGNOSIS — Z7901 Long term (current) use of anticoagulants: Secondary | ICD-10-CM | POA: Insufficient documentation

## 2020-01-04 DIAGNOSIS — Z5112 Encounter for antineoplastic immunotherapy: Secondary | ICD-10-CM | POA: Insufficient documentation

## 2020-01-04 DIAGNOSIS — R059 Cough, unspecified: Secondary | ICD-10-CM

## 2020-01-04 DIAGNOSIS — J320 Chronic maxillary sinusitis: Secondary | ICD-10-CM | POA: Diagnosis not present

## 2020-01-04 DIAGNOSIS — Z79811 Long term (current) use of aromatase inhibitors: Secondary | ICD-10-CM | POA: Diagnosis not present

## 2020-01-04 DIAGNOSIS — C50811 Malignant neoplasm of overlapping sites of right female breast: Secondary | ICD-10-CM | POA: Diagnosis present

## 2020-01-04 DIAGNOSIS — T451X5A Adverse effect of antineoplastic and immunosuppressive drugs, initial encounter: Secondary | ICD-10-CM

## 2020-01-04 DIAGNOSIS — H16223 Keratoconjunctivitis sicca, not specified as Sjogren's, bilateral: Secondary | ICD-10-CM | POA: Diagnosis not present

## 2020-01-04 DIAGNOSIS — D6481 Anemia due to antineoplastic chemotherapy: Secondary | ICD-10-CM | POA: Diagnosis not present

## 2020-01-04 DIAGNOSIS — K8020 Calculus of gallbladder without cholecystitis without obstruction: Secondary | ICD-10-CM | POA: Insufficient documentation

## 2020-01-04 LAB — IRON AND TIBC
Iron: 64 ug/dL (ref 41–142)
Saturation Ratios: 17 % — ABNORMAL LOW (ref 21–57)
TIBC: 379 ug/dL (ref 236–444)
UIBC: 315 ug/dL (ref 120–384)

## 2020-01-04 LAB — RETIC PANEL
Immature Retic Fract: 15.3 % (ref 2.3–15.9)
RBC.: 3.82 MIL/uL — ABNORMAL LOW (ref 3.87–5.11)
Retic Count, Absolute: 45.8 10*3/uL (ref 19.0–186.0)
Retic Ct Pct: 1.2 % (ref 0.4–3.1)
Reticulocyte Hemoglobin: 34.2 pg (ref >27.9)

## 2020-01-04 LAB — CBC WITH DIFFERENTIAL (CANCER CENTER ONLY)
Abs Immature Granulocytes: 0.01 10*3/uL (ref 0.00–0.07)
Basophils Absolute: 0.1 10*3/uL (ref 0.0–0.1)
Basophils Relative: 1 %
Eosinophils Absolute: 0.9 10*3/uL — ABNORMAL HIGH (ref 0.0–0.5)
Eosinophils Relative: 13 %
HCT: 34 % — ABNORMAL LOW (ref 36.0–46.0)
Hemoglobin: 11 g/dL — ABNORMAL LOW (ref 12.0–15.0)
Immature Granulocytes: 0 %
Lymphocytes Relative: 32 %
Lymphs Abs: 2.1 10*3/uL (ref 0.7–4.0)
MCH: 28.4 pg (ref 26.0–34.0)
MCHC: 32.4 g/dL (ref 30.0–36.0)
MCV: 87.9 fL (ref 80.0–100.0)
Monocytes Absolute: 0.3 10*3/uL (ref 0.1–1.0)
Monocytes Relative: 4 %
Neutro Abs: 3.2 10*3/uL (ref 1.7–7.7)
Neutrophils Relative %: 50 %
Platelet Count: 225 10*3/uL (ref 150–400)
RBC: 3.87 MIL/uL (ref 3.87–5.11)
RDW: 13.8 % (ref 11.5–15.5)
WBC Count: 6.6 10*3/uL (ref 4.0–10.5)
nRBC: 0 % (ref 0.0–0.2)

## 2020-01-04 LAB — FERRITIN: Ferritin: 103 ng/mL (ref 11–307)

## 2020-01-04 LAB — CMP (CANCER CENTER ONLY)
ALT: 15 U/L (ref 0–44)
AST: 24 U/L (ref 15–41)
Albumin: 3.8 g/dL (ref 3.5–5.0)
Alkaline Phosphatase: 150 U/L — ABNORMAL HIGH (ref 38–126)
Anion gap: 10 (ref 5–15)
BUN: 16 mg/dL (ref 8–23)
CO2: 20 mmol/L — ABNORMAL LOW (ref 22–32)
Calcium: 9.6 mg/dL (ref 8.9–10.3)
Chloride: 107 mmol/L (ref 98–111)
Creatinine: 1.33 mg/dL — ABNORMAL HIGH (ref 0.44–1.00)
GFR, Est AFR Am: 49 mL/min — ABNORMAL LOW (ref >60)
GFR, Estimated: 42 mL/min — ABNORMAL LOW (ref >60)
Glucose, Bld: 119 mg/dL — ABNORMAL HIGH (ref 70–99)
Potassium: 4 mmol/L (ref 3.5–5.1)
Sodium: 137 mmol/L (ref 135–145)
Total Bilirubin: 0.3 mg/dL (ref 0.3–1.2)
Total Protein: 7.2 g/dL (ref 6.5–8.1)

## 2020-01-04 LAB — VITAMIN B12: Vitamin B-12: 487 pg/mL (ref 180–914)

## 2020-01-04 MED ORDER — ACETAMINOPHEN 325 MG PO TABS
650.0000 mg | ORAL_TABLET | Freq: Once | ORAL | Status: AC
  Administered 2020-01-04: 650 mg via ORAL

## 2020-01-04 MED ORDER — SODIUM CHLORIDE 0.9% FLUSH
10.0000 mL | INTRAVENOUS | Status: DC | PRN
  Administered 2020-01-04: 10 mL
  Filled 2020-01-04: qty 10

## 2020-01-04 MED ORDER — PREDNISONE 10 MG PO TABS: ORAL_TABLET | ORAL | 0 refills | Status: DC

## 2020-01-04 MED ORDER — HEPARIN SOD (PORK) LOCK FLUSH 100 UNIT/ML IV SOLN
500.0000 [IU] | Freq: Once | INTRAVENOUS | Status: AC | PRN
  Administered 2020-01-04: 500 [IU]
  Filled 2020-01-04: qty 5

## 2020-01-04 MED ORDER — PERTUZ-TRASTUZ-HYALURON-ZZXF 60-60-2000 MG-MG-U/ML CHEMO ~~LOC~~ SOLN
10.0000 mL | Freq: Once | SUBCUTANEOUS | Status: AC
  Administered 2020-01-04: 10 mL via SUBCUTANEOUS
  Filled 2020-01-04: qty 10

## 2020-01-04 MED ORDER — DIPHENHYDRAMINE HCL 25 MG PO CAPS
ORAL_CAPSULE | ORAL | Status: AC
  Filled 2020-01-04: qty 1

## 2020-01-04 MED ORDER — DIPHENHYDRAMINE HCL 25 MG PO CAPS
25.0000 mg | ORAL_CAPSULE | Freq: Once | ORAL | Status: AC
  Administered 2020-01-04: 25 mg via ORAL

## 2020-01-04 MED ORDER — ACETAMINOPHEN 325 MG PO TABS
ORAL_TABLET | ORAL | Status: AC
  Filled 2020-01-04: qty 2

## 2020-01-04 MED FILL — predniSONE 10 MG TABS: 10 | 8 days supply | Qty: 20 | Fill #0

## 2020-01-04 NOTE — Patient Instructions (Signed)
[facility] Discharge Instructions for Patients Receiving Chemotherapy  Today you received the following chemotherapy agents: [place]  To help prevent nausea and vomiting after your treatment, we encourage you to take your nausea medication as directed   If you develop nausea and vomiting that is not controlled by your nausea medication, call the clinic.   BELOW ARE SYMPTOMS THAT SHOULD BE REPORTED IMMEDIATELY:  *FEVER GREATER THAN 100.5 F  *CHILLS WITH OR WITHOUT FEVER  NAUSEA AND VOMITING THAT IS NOT CONTROLLED WITH YOUR NAUSEA MEDICATION  *UNUSUAL SHORTNESS OF BREATH  *UNUSUAL BRUISING OR BLEEDING  TENDERNESS IN MOUTH AND THROAT WITH OR WITHOUT PRESENCE OF ULCERS  *URINARY PROBLEMS  *BOWEL PROBLEMS  UNUSUAL RASH Items with * indicate a potential emergency and should be followed up as soon as possible.  Feel free to call the clinic should you have any questions or concerns. The clinic phone number is [phone].  Please show the CHEMO ALERT CARD at check-in to the Emergency Department and triage nurse.   

## 2020-01-06 DIAGNOSIS — H5213 Myopia, bilateral: Secondary | ICD-10-CM | POA: Diagnosis not present

## 2020-01-06 LAB — FOLATE RBC
Folate, Hemolysate: 327 ng/mL
Folate, RBC: 956 ng/mL (ref >498)
Hematocrit: 34.2 % (ref 34.0–46.6)

## 2020-01-07 ENCOUNTER — Telehealth: Payer: Self-pay | Admitting: Hematology

## 2020-01-07 MED FILL — OMEPRAZOLE 40 MG CPDR: 40 | 30 days supply | Qty: 60 | Fill #1

## 2020-01-07 MED FILL — GABAPENTIN 300 MG CAPSULE: 300 | 30 days supply | Qty: 270 | Fill #1

## 2020-01-07 NOTE — Telephone Encounter (Signed)
Scheduled per 6/18 los. Unable to reach pt. Left voicemail- Lab/PF is moved to 7/7 before scan.

## 2020-01-18 NOTE — Progress Notes (Signed)
[place]   Telephone:([phone]) [phone] [phone]   Clinic Follow up Note   Patient Care Team: [name], DO as PCP - General (Family Medicine) [name], MD as Consulting Physician (Gastroenterology) [name], MD as Consulting Physician (Hematology)  Date of Service:  01/25/2020  CHIEF COMPLAINT: F/u for metastatic breast cancer  SUMMARY OF ONCOLOGIC HISTORY: Oncology History Overview Note  Cancer Staging Metastatic breast cancer [facility]) Staging form: Breast, AJCC 8th Edition - Clinical stage from 03/24/2018: Stage IV (cT2, cN1, pM1, G3, ER+, PR+, HER2+) - Signed by [name], MD on 03/30/2018     Metastatic breast cancer ([place])  01/30/2018 Procedure   Colonoscopy showed small polyp in the sigmoid colon, removed, the exam of colon including the terminal ileum was otherwise negative.   01/30/2018 Procedure   EGD by Dr. Collene Mares showed small hiatal hernia, a 8 mm polypoid lesion in the cardia, biopsied.   03/09/2018 Imaging   03/09/2018 US Abdomen IMPRESSION: 1. Mass lesions throughout the liver, consistent with metastatic disease. Liver as a somewhat nodular contour suggesting underlying hepatic cirrhosis. Inhomogeneous echotexture to the liver.  2. Cholelithiasis with mild gallbladder wall thickening. A degree of cholecystitis cannot be excluded by ultrasound.  3. Portions of pancreas obscured by gas. Visualized portions of pancreas appear normal.  4. Small right kidney. Etiology uncertain. This finding potentially may be indicative of renal artery stenosis. In this regard, question whether patient is hypertensive.   03/15/2018 Imaging   CT CAP with contrast  IMPRESSION: 1. Widespread hepatic metastasis. 2. 2.6 cm lateral right breast soft tissue nodule could represent a breast primary or an incidental benign lesion. Consider correlation with mammogram and ultrasound. 3. No definite source of primary malignancy identified within the abdomen or  pelvis. There is possible rectosigmoid junction wall thickening. Consider colonoscopy with attention to this area. 4. Distal esophageal wall thickening, suggesting esophagitis.   03/24/2018 Cancer Staging   Staging form: Breast, AJCC 8th Edition - Clinical stage from 03/24/2018: Stage IV (cT2, cN1, pM1, G3, ER+, PR+, HER2+) - Signed by [name], MD on 03/30/2018   03/24/2018 Initial Biopsy   Diagnosis 1. 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 Breast, right, needle core biopsy, 9 o'clock, 7cm from nipple - INVASIVE DUCTAL CARCINOMA. -The carcinoma is somewhat morphologically dissimilar from that in part 1. It appears grade III 3. Lymph node, needle/core biopsy, right axillary - METASTATIC CARCINOMA IN 1 OF 1 LYMPH NODE (1/1).   03/24/2018 Receptors her2   Breast biopsy: 1. Estrogen Receptor: 40%, POSITIVE, STRONG-MODERATE STAINING INTENSITY Progesterone Receptor: 70%, POSITIVE, STRONG STAINING INTENSITY Proliferation Marker Ki67: 20% HER 2 equivocal by IHC 2+, POSITIVE by FISH, ratio 2.4 and copy #4.2  2. Estrogen Receptor: 60%, POSITIVE, MODERATE STAINING INTENSITY Progesterone Receptor: 40%, POSITIVE, MODERATE STAINING INTENSITY Proliferation Marker Ki67: 20% HER2 (+) by IHC 3+   03/24/2018 Initial Diagnosis   Metastatic breast cancer ([name])   03/27/2018 Pathology Results   Diagnosis Liver, needle/core biopsy, Right - METASTATIC CARCINOMA TO LIVER, CONSISTENT WITH PATIENTS CLINICAL HISTORY OF PRIMARY BREAST CARCINOMA.  ER 80%+ PR40%+ HER2- (by [facility], IHC 2+)  Ki67 50%    03/28/2018 Pathology Results   03/28/2018 Surgical Pathology Diagnosis 1. Breast, left, needle core biopsy, 9 o'clock - FIBROCYSTIC CHANGES. - THERE IS NO EVIDENCE OF MALIGNANCY. 2. Breast, left, needle core biopsy, 2 o'clock - FIBROADENOMA. - THERE IS NO EVIDENCE OF  MALIGNANCY. - SEE COMMENT.   03/29/2018 Imaging    03/29/2018 Bone Scan IMPRESSION: No scintigraphic evidence of osseous metastatic disease.   03/30/2018 Imaging   Bone scan  IMPRESSION: No scintigraphic evidence of osseous metastatic disease.    04/07/2018 -  Chemotherapy   First line chemo weekly Taxol and herceptin/Perjeta every 3 weeks starting 04/07/18. She developed infusion reaction to taxol and it was discontinued. Added Abraxane on C1D8, 2 weeks on/1 week off.  Abraxne stopped after 10/13/18 due to worsening Neuropathy. She has continued with maintenance Herceptin injection/Perjeta q3weeks. Switched to Herceptin injections on 04/06/19.    06/06/2018 Imaging   CT CAP IMPRESSION: 1. Generally improved appearance, with reduced axillary adenopathy and reduced enhancing component of the hepatic masses, with some of the hepatic mass is moderately smaller than on the prior exam. Reduced size of the right lateral breast mass compared to the prior 03/15/2018 exam. 2. New mild interstitial accentuation in the lungs, significance uncertain. Part of this appearance may be due to lower lung volumes on today's exam. 3. Mild wall thickening in the descending colon and upper rectum suggesting low-grade colitis/inflammation. Prominent stool throughout the colon favors constipation. 4. Other imaging findings of potential clinical significance: Aortic Atherosclerosis (ICD10-I70.0). Mild cardiomegaly. Mild nodularity in the right lower lobe appears stable. Contracted and thick-walled gallbladder.    09/18/2018 Imaging   CT CAP W Contrast 09/18/18  IMPRESSION: 1. Liver metastases have decreased in size. 2. Right breast mass, mild right axillary lymphadenopathy and scattered tiny right pulmonary nodules are all stable. 3. New mild left supraclavicular and left subpectoral lymphadenopathy, can not exclude progression of metastatic nodal disease. 4. Moderate colorectal stool volume, which may indicate constipation. 5.  Aortic Atherosclerosis  (ICD10-I70.0).   10/2018 -  Anti-estrogen oral therapy   Letrozole 2.5 mg daily starting 10/2018   01/10/2019 Imaging   CT CAP W Contrast 01/10/19  IMPRESSION: 1. Continued improvement in the hepatic metastatic lesions which have reduced in size. 2. Stable mild left supraclavicular and subpectoral adenopathy. 3. Essentially stable small right lower lobe pulmonary nodule and separate small subpleural nodule along the right hemidiaphragm. Surveillance suggested. 4. Other imaging findings of potential clinical significance: Mild cardiomegaly. Mild circumferential distal esophageal wall thickening, the most common cause would be esophagitis. Airway thickening is present, suggesting bronchitis or reactive airways disease. Airway plugging in the lower lobes and in the right middle lobe. Stable small right adrenal adenoma.   05/15/2019 Imaging   CT CAP W Contrast 05/15/19  IMPRESSION: CT CHEST IMPRESSION   1. Similar appearance of borderline supraclavicular, axillary, and subpectoral adenopathy. 2. Improved and resolved right lower lobe pulmonary nodularity. 3. Esophageal air fluid level suggests dysmotility or gastroesophageal reflux.   CT ABDOMEN AND PELVIS IMPRESSION   1. Improved hepatic metastasis. 2. Small bowel mesenteric lymph nodes which are upper normal and mildly enlarged. Likely increased and similar as detailed above. Indeterminate. Recommend attention on follow-up. 3. Cholelithiasis. 4. Motion degradation throughout the lower chest and abdomen.   09/19/2019 Imaging   CT CAP W contrast  IMPRESSION: 1. Interval decrease in size of right axillary and subpectoral lymph nodes. There are no pathologically enlarged lymph nodes remaining in the chest, abdomen, or pelvis. No new lymphadenopathy. 2. No significant change in post treatment appearance of multiple low-attenuation liver lesions, in keeping with treated metastases. 3. No evidence of new metastatic disease in the  chest, abdomen, or pelvis. 4. Aortic Atherosclerosis (ICD10-I70.0).   11/16/2019 Imaging   IMRI Brain  MPRESSION:  Minimal chronic microvascular ischemic changes. No acute intracranial process.   Active bilateral maxillary sinus disease with mild frontoethmoid mucosal thickening.   01/23/2020 Imaging   CT CAP W contrast  IMPRESSION: 1. Stable exam. No new or progressive interval findings. 2. Stable appearance of upper normal right axillary lymph nodes. Tiny subpectoral and left axillary nodes are unchanged. 3. Generally similar appearance of ill-defined hypoattenuating lesions in the liver compatible with treated metastases. 1 lesion in the central liver is minimally more conspicuous today, likely related to bolus timing and attention on follow-up recommended. No new suspicious liver lesion on today's study. 4. Stable 10 mm right adrenal nodule., indeterminate. Continued attention on follow-up imaging recommended. 5. Cholelithiasis. 6. Aortic Atherosclerosis (ICD10-I70.0).      CURRENT THERAPY:  -Herceptin injection/Perjeta q3weeksmaintenance therapy.Herceptin switched to injection on 04/06/19. -Letrozole 2.5 mg daily starting 10/2018  INTERVAL HISTORY:  NAKETA DADDARIO is here for a follow up and treatment. She presents to the clinic with her daughter. She notes she has another flare of wheezing from citric juices again. She notes she plan to stop drinking them altogether. She notes her left breast pain has resolved. She notes she continues to tolerate Herceptin and Letrozole. She has been on Gabapentin 920m TID and her neuropathy she barely feels. She notes she Cymbalta has helped her as well. I reviewed her medication list with her.     REVIEW OF SYSTEMS:   Constitutional: Denies fevers, chills or abnormal weight loss Eyes: Denies blurriness of vision Ears, nose, mouth, throat, and face: Denies mucositis or sore throat Respiratory: Denies cough, dyspnea (+)   wheezes Cardiovascular: Denies palpitation, chest discomfort or lower extremity swelling Gastrointestinal:  Denies nausea, heartburn or change in bowel habits Skin: Denies abnormal skin rashes Lymphatics: Denies new lymphadenopathy or easy bruising Neurological:Denies numbness, tingling or new weaknesses Behavioral/Psych: Mood is stable, no new changes  All other systems were reviewed with the patient and are negative.  MEDICAL HISTORY:  Past Medical History:  Diagnosis Date  . GERD (gastroesophageal reflux disease)   . Hypertension   . rt breast ca with mets to liver dx'd 03/2018    SURGICAL HISTORY: Past Surgical History:  Procedure Laterality Date  . COLONOSCOPY    . ESOPHAGOGASTRODUODENOSCOPY ENDOSCOPY    . IR IMAGING GUIDED PORT INSERTION  04/04/2018    I have reviewed the social history and family history with the patient and they are unchanged from previous note.  ALLERGIES:  has No Known Allergies.  MEDICATIONS:  Current Outpatient Medications  Medication Sig Dispense Refill  . albuterol (VENTOLIN HFA) 108 (90 Base) MCG/ACT inhaler Inhale 1-2 puffs into the lungs every 6 (six) hours as needed for wheezing or shortness of breath.    .Marland KitchenamLODipine (NORVASC) 5 MG tablet TAKE 1 TABLET (5 MG TOTAL) BY MOUTH DAILY. 30 tablet 2  . carvedilol (COREG) 3.125 MG tablet Take 1 tablet (3.125 mg total) by mouth 2 (two) times daily. 180 tablet 3  . diphenoxylate-atropine (LOMOTIL) 2.5-0.025 MG tablet Take 1-2 tablets by mouth 4 (four) times daily as needed for diarrhea or loose stools. 60 tablet 1  . DULoxetine (CYMBALTA) 20 MG capsule Take 1 capsule (20 mg total) by mouth daily. 30 capsule 2  . gabapentin (NEURONTIN) 300 MG capsule May take up to 3 capsules three times daily 270 capsule 3  . letrozole (FEMARA) 2.5 MG tablet TAKE 1 TABLET (2.5 MG TOTAL) BY MOUTH DAILY. 90 tablet 1  . lidocaine-prilocaine (EMLA) cream Apply to affected  area once 30 g 3  . losartan (COZAAR) 50 MG tablet  Take 1 tablet (50 mg total) by mouth daily. 90 tablet 3  . omeprazole (PRILOSEC) 40 MG capsule Take 1 capsule (40 mg total) by mouth 2 (two) times daily. 60 capsule 3  . potassium chloride SA (KLOR-CON) 20 MEQ tablet TAKE 1 TABLET BY MOUTH ONCE DAILY 30 tablet 0  . predniSONE (DELTASONE) 10 MG tablet 4 tabs for 2 days, then 3 tabs for 2 days, 2 tabs for 2 days, then 1 tab for 2 days, then stop 20 tablet 0  . rivaroxaban (XARELTO) 20 MG TABS tablet TAKE 1 TABLET BY MOUTH DAILY WITH SUPPER 30 tablet 5  . spironolactone (ALDACTONE) 25 MG tablet TAKE 1 TABLET BY MOUTH ONCE DAILY 90 tablet 3  . traMADol (ULTRAM) 50 MG tablet Take 1 tablet (50 mg total) by mouth every 6 (six) hours as needed for moderate pain or severe pain. 30 tablet 0   No current facility-administered medications for this visit.    PHYSICAL EXAMINATION: ECOG PERFORMANCE STATUS: 1 - Symptomatic but completely ambulatory  Vitals:   01/25/20 0843  BP: 131/90  Pulse: 79  Resp: 16  Temp: 98.1 F (36.7 C)  SpO2: 100%   Filed Weights   01/25/20 0843  Weight: 142 lb 3.2 oz (64.5 kg)    GENERAL:alert, no distress and comfortable SKIN: skin color, texture, turgor are normal, no rashes or significant lesions EYES: normal, Conjunctiva are pink and non-injected, sclera clear  NECK: supple, thyroid normal size, non-tender, without nodularity LYMPH:  no palpable lymphadenopathy in the cervical, axillary  LUNGS: clear to auscultation and percussion (+) She has mild wheezes on exam  HEART: regular rate & rhythm and no murmurs and no lower extremity edema ABDOMEN:abdomen soft, non-tender and normal bowel sounds Musculoskeletal:no cyanosis of digits and no clubbing  NEURO: alert & oriented x 3 with fluent speech, no focal motor/sensory deficits  LABORATORY DATA:  I have reviewed the data as listed CBC Latest Ref Rng & Units 01/23/2020 01/04/2020 01/04/2020  WBC 4.0 - 10.5 K/uL 7.6 6.6 -  Hemoglobin 12.0 - 15.0 g/dL 11.3(L) 11.0(L) -   Hematocrit 36 - 46 % 35.2(L) 34.0(L) 34.2  Platelets 150 - 400 K/uL 204 225 -     CMP Latest Ref Rng & Units 01/23/2020 01/04/2020 12/12/2019  Glucose 70 - 99 mg/dL 94 119(H) 115(H)  BUN 8 - 23 mg/dL _0 Creatinine 0.44 - 1.00 mg/dL 1.25(H) 1.33(H) 1.15(H)  Sodium 135 - 145 mmol/L 139 137 138  Potassium 3.5 - 5.1 mmol/L 4.1 4.0 4.3  Chloride 98 - 111 mmol/L 107 107 106  CO2 22 - 32 mmol/L 21(L) 20(L) 24  Calcium 8.9 - 10.3 mg/dL 9.8 9.6 9.4  Total Protein 6.5 - 8.1 g/dL 7.6 7.2 6.7  Total Bilirubin 0.3 - 1.2 mg/dL 0.3 0.3 0.4  Alkaline Phos 38 - 126 U/L 132(H) 150(H) 129(H)  AST 15 - 41 U/L _1 ALT 0 - 44 U/L _2 RADIOGRAPHIC STUDIES: I have personally reviewed the radiological images as listed and agreed with the findings in the report. No results found.   ASSESSMENT & PLAN:  Teresa Hays is a 64 y.o. female with    1. Metastatic right breast cancer to liver, stage IV, ER+/PR+/HER2+, Liver mets ER+/PR+/HER2- -She was diagnosed in 03/2018.She presented with diffuse liver metastasis, tworight breast massesand right axillary adenopathy.Breast mass biopsy showed ER  PR positive, but 1 was HER-2 positive, the other one was HER-2 negative. Liver met was HER2-.  -Given the metastatic disease, her cancer is not curable but still treatable. Shehas been treated withLetrozole daily andFirst-line Herceptin/Perjetaevery 3 weeks and weekly Taxol. Due to infusionreaction, taxol was switched to Abraxane.We stoppedAbraxaneafter cycle 10 due to worsening neuropathyand continued with Herceptin/Perjetaas maintenance therapy.Herceptin changed to injection on 04/06/19.  -I previously discussed there is a chance she can proceed with right Lumpectomy and axillary LN dissection to reduce her tumor burden. She is open to this. Will discuss this in tumor board  -I personally reviewed and discussed her CT CAP images from 01/23/20 with pt and her daughter which showed  stable disease in the liver with no new or progressive findings. She is overall responding well to treatment. I personally reviewed scan with her.  -will discuss her case in tumor conference to see if she would be a candidate for breast surgery  -Labs reviewed from 01/23/20, CBC and CMP WNL except Hg 11.3, Cr 1.25, Alk Phos 132. CA 27.29 57.9. Overall adequate to proceed with Herceptin/Perjeta injection today and continue every 3 weeks.  -Continue Letrozole  -f/u in 6 weeks    2.Peripheral neuropathy, grade 2, Right Foot pain, Secondarytochemotherapy -Ipreviouslystropped Abraxane on 10/13/18. -Her main pain was inthe heal and ball of herright foot. -She has tried PT twice and does not feel it helps enough.She has received steroid injectionsto right foot from podiatrist which has improved her pain.  -She will also continue OTC Calcium and Vit D, Oral B12, Gabapentin 311m 3 tabs TID and Cymbalta to 435m She only feels slight tingling on high dose Gabapentin and Cymbalta.  -She overall still has adequate functions and ambulation.   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 Anemia, Secondary to chemo -Continue monitoring, will consider blood transfusion if hemoglobin less than 8. -Mild and stable.  5Social support  -She lives alone, has [place] sister and [place] in [place] daughter lives in [place] few hours away  -On Ativan PRN due to anxietyabout diagnosis and treatment  6. Goal of care discussion  -she is full code  7. Recurrentdyspnea and wheezing, Acid Reflux, Tightness of throat -She has been seen by Dr. IcaValeta Harmso notes this is related to her post nasal drip and her Acid reflux can contribute.  -She is on Prilosec2 tabs BID, allergy medication and nasal spray and inhaler.Her insurance denied Dexilant.  -She knows to stop drinking Citric and Acidic juices as it triggers her Acid Reflux and Wheezing. -Continue to f/u with Dr IcaValeta Harmsd Dr ManCollene Maresd  pulmonary clinic.    Plan: -CT CAP reviewed, stable disease  -Labs reviewed and adequate to proceed with Phesgo injection today  -continue letrozole  -Lab, flush, and Herceptin injection/Perjetain 3 and 6 weeks on Wednesdays -F/u in 6 weeks    No problem-specific Assessment & Plan notes found for this encounter.   No orders of the defined types were placed in this encounter.  All questions were answered. The patient knows to call the clinic with any problems, questions or concerns. No barriers to learning was detected. The total time spent in the appointment was 30 minutes.     YanTruitt MerleD 01/25/2020   I, AmoJoslyn Devonm acting as scribe for YanTruitt MerleD.   I have reviewed the above documentation for accuracy and completeness, and I agree with the above.

## 2020-01-21 ENCOUNTER — Other Ambulatory Visit: Payer: Self-pay | Admitting: Family Medicine

## 2020-01-21 DIAGNOSIS — I10 Essential (primary) hypertension: Secondary | ICD-10-CM

## 2020-01-21 MED FILL — XARELTO 20 MG TABLET: 20 | 30 days supply | Qty: 30 | Fill #2

## 2020-01-22 ENCOUNTER — Other Ambulatory Visit: Payer: Self-pay | Admitting: Family Medicine

## 2020-01-22 DIAGNOSIS — I10 Essential (primary) hypertension: Secondary | ICD-10-CM

## 2020-01-23 ENCOUNTER — Encounter (HOSPITAL_COMMUNITY): Payer: Self-pay

## 2020-01-23 ENCOUNTER — Other Ambulatory Visit: Payer: Self-pay

## 2020-01-23 ENCOUNTER — Inpatient Hospital Stay: Payer: Medicaid Other

## 2020-01-23 ENCOUNTER — Inpatient Hospital Stay: Payer: Medicaid Other | Attending: Hematology

## 2020-01-23 ENCOUNTER — Ambulatory Visit (HOSPITAL_COMMUNITY)
Admission: RE | Admit: 2020-01-23 | Discharge: 2020-01-23 | Disposition: A | Discharge status: Home / Self Care | Payer: Medicaid Other | Source: Ambulatory Visit | Attending: Hematology | Admitting: Hematology

## 2020-01-23 DIAGNOSIS — T451X5A Adverse effect of antineoplastic and immunosuppressive drugs, initial encounter: Secondary | ICD-10-CM | POA: Insufficient documentation

## 2020-01-23 DIAGNOSIS — Z5111 Encounter for antineoplastic chemotherapy: Secondary | ICD-10-CM | POA: Diagnosis present

## 2020-01-23 DIAGNOSIS — K8020 Calculus of gallbladder without cholecystitis without obstruction: Secondary | ICD-10-CM | POA: Insufficient documentation

## 2020-01-23 DIAGNOSIS — R062 Wheezing: Secondary | ICD-10-CM | POA: Diagnosis not present

## 2020-01-23 DIAGNOSIS — D6481 Anemia due to antineoplastic chemotherapy: Secondary | ICD-10-CM | POA: Diagnosis not present

## 2020-01-23 DIAGNOSIS — I6782 Cerebral ischemia: Secondary | ICD-10-CM | POA: Diagnosis not present

## 2020-01-23 DIAGNOSIS — Z170 Estrogen receptor positive status [ER+]: Secondary | ICD-10-CM | POA: Insufficient documentation

## 2020-01-23 DIAGNOSIS — Z79899 Other long term (current) drug therapy: Secondary | ICD-10-CM | POA: Diagnosis not present

## 2020-01-23 DIAGNOSIS — Z79811 Long term (current) use of aromatase inhibitors: Secondary | ICD-10-CM | POA: Diagnosis not present

## 2020-01-23 DIAGNOSIS — J320 Chronic maxillary sinusitis: Secondary | ICD-10-CM | POA: Insufficient documentation

## 2020-01-23 DIAGNOSIS — C50811 Malignant neoplasm of overlapping sites of right female breast: Secondary | ICD-10-CM | POA: Diagnosis present

## 2020-01-23 DIAGNOSIS — G620 Drug-induced polyneuropathy: Secondary | ICD-10-CM | POA: Diagnosis not present

## 2020-01-23 DIAGNOSIS — Z7952 Long term (current) use of systemic steroids: Secondary | ICD-10-CM | POA: Diagnosis not present

## 2020-01-23 DIAGNOSIS — Z7901 Long term (current) use of anticoagulants: Secondary | ICD-10-CM | POA: Insufficient documentation

## 2020-01-23 DIAGNOSIS — I82622 Acute embolism and thrombosis of deep veins of left upper extremity: Secondary | ICD-10-CM | POA: Insufficient documentation

## 2020-01-23 DIAGNOSIS — C50919 Malignant neoplasm of unspecified site of unspecified female breast: Secondary | ICD-10-CM

## 2020-01-23 DIAGNOSIS — K5900 Constipation, unspecified: Secondary | ICD-10-CM | POA: Diagnosis not present

## 2020-01-23 DIAGNOSIS — K2100 Gastro-esophageal reflux disease with esophagitis, without bleeding: Secondary | ICD-10-CM | POA: Insufficient documentation

## 2020-01-23 DIAGNOSIS — Z95828 Presence of other vascular implants and grafts: Secondary | ICD-10-CM

## 2020-01-23 DIAGNOSIS — M79671 Pain in right foot: Secondary | ICD-10-CM | POA: Insufficient documentation

## 2020-01-23 DIAGNOSIS — C787 Secondary malignant neoplasm of liver and intrahepatic bile duct: Secondary | ICD-10-CM | POA: Insufficient documentation

## 2020-01-23 DIAGNOSIS — D3501 Benign neoplasm of right adrenal gland: Secondary | ICD-10-CM | POA: Diagnosis not present

## 2020-01-23 DIAGNOSIS — I700 Atherosclerosis of aorta: Secondary | ICD-10-CM | POA: Diagnosis not present

## 2020-01-23 DIAGNOSIS — R918 Other nonspecific abnormal finding of lung field: Secondary | ICD-10-CM | POA: Diagnosis not present

## 2020-01-23 DIAGNOSIS — C773 Secondary and unspecified malignant neoplasm of axilla and upper limb lymph nodes: Secondary | ICD-10-CM | POA: Insufficient documentation

## 2020-01-23 LAB — CMP (CANCER CENTER ONLY)
ALT: 21 U/L (ref 0–44)
AST: 26 U/L (ref 15–41)
Albumin: 3.9 g/dL (ref 3.5–5.0)
Alkaline Phosphatase: 132 U/L — ABNORMAL HIGH (ref 38–126)
Anion gap: 11 (ref 5–15)
BUN: 10 mg/dL (ref 8–23)
CO2: 21 mmol/L — ABNORMAL LOW (ref 22–32)
Calcium: 9.8 mg/dL (ref 8.9–10.3)
Chloride: 107 mmol/L (ref 98–111)
Creatinine: 1.25 mg/dL — ABNORMAL HIGH (ref 0.44–1.00)
GFR, Est AFR Am: 53 mL/min — ABNORMAL LOW (ref >60)
GFR, Estimated: 46 mL/min — ABNORMAL LOW (ref >60)
Glucose, Bld: 94 mg/dL (ref 70–99)
Potassium: 4.1 mmol/L (ref 3.5–5.1)
Sodium: 139 mmol/L (ref 135–145)
Total Bilirubin: 0.3 mg/dL (ref 0.3–1.2)
Total Protein: 7.6 g/dL (ref 6.5–8.1)

## 2020-01-23 LAB — CBC WITH DIFFERENTIAL (CANCER CENTER ONLY)
Abs Immature Granulocytes: 0.01 10*3/uL (ref 0.00–0.07)
Basophils Absolute: 0 10*3/uL (ref 0.0–0.1)
Basophils Relative: 1 %
Eosinophils Absolute: 0.8 10*3/uL — ABNORMAL HIGH (ref 0.0–0.5)
Eosinophils Relative: 10 %
HCT: 35.2 % — ABNORMAL LOW (ref 36.0–46.0)
Hemoglobin: 11.3 g/dL — ABNORMAL LOW (ref 12.0–15.0)
Immature Granulocytes: 0 %
Lymphocytes Relative: 29 %
Lymphs Abs: 2.2 10*3/uL (ref 0.7–4.0)
MCH: 29.2 pg (ref 26.0–34.0)
MCHC: 32.1 g/dL (ref 30.0–36.0)
MCV: 91 fL (ref 80.0–100.0)
Monocytes Absolute: 0.3 10*3/uL (ref 0.1–1.0)
Monocytes Relative: 5 %
Neutro Abs: 4.2 10*3/uL (ref 1.7–7.7)
Neutrophils Relative %: 55 %
Platelet Count: 204 10*3/uL (ref 150–400)
RBC: 3.87 MIL/uL (ref 3.87–5.11)
RDW: 14.3 % (ref 11.5–15.5)
WBC Count: 7.6 10*3/uL (ref 4.0–10.5)
nRBC: 0 % (ref 0.0–0.2)

## 2020-01-23 MED ORDER — HEPARIN SOD (PORK) LOCK FLUSH 100 UNIT/ML IV SOLN
500.0000 [IU] | Freq: Once | INTRAVENOUS | Status: AC
  Administered 2020-01-23: 500 [IU] via INTRAVENOUS

## 2020-01-23 MED ORDER — SODIUM CHLORIDE (PF) 0.9 % IJ SOLN
INTRAMUSCULAR | Status: AC
  Filled 2020-01-23: qty 50

## 2020-01-23 MED ORDER — SODIUM CHLORIDE 0.9% FLUSH
10.0000 mL | INTRAVENOUS | Status: DC | PRN
  Administered 2020-01-23: 10 mL
  Filled 2020-01-23: qty 10

## 2020-01-23 MED ORDER — IOHEXOL 300 MG/ML  SOLN
100.0000 mL | Freq: Once | INTRAMUSCULAR | Status: AC | PRN
  Administered 2020-01-23: 100 mL via INTRAVENOUS

## 2020-01-23 MED ORDER — HEPARIN SOD (PORK) LOCK FLUSH 100 UNIT/ML IV SOLN
INTRAVENOUS | Status: AC
  Filled 2020-01-23: qty 5

## 2020-01-23 MED ORDER — HEPARIN SOD (PORK) LOCK FLUSH 100 UNIT/ML IV SOLN
500.0000 [IU] | Freq: Once | INTRAVENOUS | Status: DC | PRN
  Filled 2020-01-23: qty 5

## 2020-01-24 ENCOUNTER — Other Ambulatory Visit: Payer: Self-pay | Admitting: Family Medicine

## 2020-01-24 DIAGNOSIS — I10 Essential (primary) hypertension: Secondary | ICD-10-CM

## 2020-01-24 LAB — CANCER ANTIGEN 27.29: CA 27.29: 57.9 U/mL — ABNORMAL HIGH (ref 0.0–38.6)

## 2020-01-25 ENCOUNTER — Other Ambulatory Visit: Payer: Self-pay

## 2020-01-25 ENCOUNTER — Encounter: Payer: Self-pay | Admitting: Hematology

## 2020-01-25 ENCOUNTER — Other Ambulatory Visit: Payer: Medicaid Other

## 2020-01-25 ENCOUNTER — Inpatient Hospital Stay (HOSPITAL_BASED_OUTPATIENT_CLINIC_OR_DEPARTMENT_OTHER): Payer: Medicaid Other | Admitting: Hematology

## 2020-01-25 ENCOUNTER — Inpatient Hospital Stay: Payer: Medicaid Other

## 2020-01-25 VITALS — BP 124/81 | HR 70 | Temp 98.1°F | Resp 18

## 2020-01-25 VITALS — BP 131/90 | HR 79 | Temp 98.1°F | Resp 16 | Ht 64.0 in | Wt 142.2 lb

## 2020-01-25 DIAGNOSIS — C50919 Malignant neoplasm of unspecified site of unspecified female breast: Secondary | ICD-10-CM | POA: Diagnosis not present

## 2020-01-25 DIAGNOSIS — Z7189 Other specified counseling: Secondary | ICD-10-CM

## 2020-01-25 DIAGNOSIS — Z5111 Encounter for antineoplastic chemotherapy: Secondary | ICD-10-CM | POA: Diagnosis not present

## 2020-01-25 MED ORDER — DIPHENHYDRAMINE HCL 25 MG PO CAPS
ORAL_CAPSULE | ORAL | Status: AC
  Filled 2020-01-25: qty 1

## 2020-01-25 MED ORDER — ACETAMINOPHEN 325 MG PO TABS
ORAL_TABLET | ORAL | Status: AC
  Filled 2020-01-25: qty 2

## 2020-01-25 MED ORDER — PERTUZ-TRASTUZ-HYALURON-ZZXF 60-60-2000 MG-MG-U/ML CHEMO ~~LOC~~ SOLN
10.0000 mL | Freq: Once | SUBCUTANEOUS | Status: AC
  Administered 2020-01-25: 10 mL via SUBCUTANEOUS
  Filled 2020-01-25: qty 10

## 2020-01-25 MED ORDER — DIPHENHYDRAMINE HCL 25 MG PO CAPS
25.0000 mg | ORAL_CAPSULE | Freq: Once | ORAL | Status: AC
  Administered 2020-01-25: 25 mg via ORAL

## 2020-01-25 MED ORDER — ACETAMINOPHEN 325 MG PO TABS
650.0000 mg | ORAL_TABLET | Freq: Once | ORAL | Status: AC
  Administered 2020-01-25: 650 mg via ORAL

## 2020-01-25 MED FILL — AMLODIPINE BESYLATE 5 MG TA: 5 | 30 days supply | Qty: 30 | Fill #0

## 2020-01-25 NOTE — Patient Instructions (Signed)
[facility] Discharge Instructions for Patients Receiving Chemotherapy  Today you received the following immunotherapy agents:  Pertuzumab/Trastuzumab Hyaluronidase SQ  To help prevent nausea and vomiting after your treatment, we encourage you to take your nausea medication as needed & prescribed.  If you develop nausea and vomiting that is not controlled by your nausea medication, call the clinic.   BELOW ARE SYMPTOMS THAT SHOULD BE REPORTED IMMEDIATELY:  *FEVER GREATER THAN 100.5 F  *CHILLS WITH OR WITHOUT FEVER  NAUSEA AND VOMITING THAT IS NOT CONTROLLED WITH YOUR NAUSEA MEDICATION  *UNUSUAL SHORTNESS OF BREATH  *UNUSUAL BRUISING OR BLEEDING  TENDERNESS IN MOUTH AND THROAT WITH OR WITHOUT PRESENCE OF ULCERS  *URINARY PROBLEMS  *BOWEL PROBLEMS  UNUSUAL RASH Items with * indicate a potential emergency and should be followed up as soon as possible.  Feel free to call the clinic should you have any questions or concerns. The clinic phone number is [phone].  Please show the [facility] at check-in to the Emergency Department and triage nurse.

## 2020-01-28 ENCOUNTER — Other Ambulatory Visit: Payer: Self-pay | Admitting: Pulmonary Disease

## 2020-01-28 ENCOUNTER — Telehealth: Payer: Self-pay | Admitting: Pulmonary Disease

## 2020-01-28 ENCOUNTER — Telehealth: Payer: Self-pay | Admitting: Hematology

## 2020-01-28 ENCOUNTER — Other Ambulatory Visit: Payer: Self-pay | Admitting: Hematology

## 2020-01-28 DIAGNOSIS — R0602 Shortness of breath: Secondary | ICD-10-CM

## 2020-01-28 DIAGNOSIS — R062 Wheezing: Secondary | ICD-10-CM

## 2020-01-28 MED FILL — LOSARTAN POTASSIUM 50 MG TA: 50 | 30 days supply | Qty: 30 | Fill #10

## 2020-01-28 MED FILL — DULOXETINE HCL 20 MG CPEP: 20 | 30 days supply | Qty: 30 | Fill #1

## 2020-01-28 NOTE — Telephone Encounter (Signed)
Called and spoke with Patient. Patient is seen by Dr Valeta Harms, but LOV was 12/03/19, with Brian,NP. Patient stated she has been having some wheezing, that started beginning of last week.  Patient stated she is having some productive cough, with whitish sputum.  Patient denies any sob, fever, loss of taste, or smell. Patient was offered OV with Aaron Edelman, NP 01/31/20 at 1200 or 1600, but Patient stated she can not come Thursday 01/31/20. Patient stated she has GI OV appointment coming up 02/05/20, and wanted Aaron Edelman, NP to know about upcoming OV. Next available OV's after 7/15, is 02/12/20, at this time. Patient is requesting any prescriptions to be sent to [facility].  LOV-12/03/19 Aaron Edelman, NP AVS-  Resolving bronchitis-like episode  Plan: Schedule pulmonary function testing Finish Breztri samples Notify our office if breathing worsens after stopping inhaler therapy  Message routed to [facility]

## 2020-01-28 NOTE — Telephone Encounter (Signed)
Called and spoke with Patient.  Brian,NP recommendations given. Patient scheduled 1115, with Brian,NP.  Patient aware cxr is needed. Patient stated she had CT chest 01/23/20, and wanted to know if cxr would still be needed.  Message routed to [place], NP

## 2020-01-28 NOTE — Telephone Encounter (Signed)
Thank you noted.   Teresa Hays

## 2020-01-28 NOTE — Telephone Encounter (Signed)
Scheduled per 7/9 los. Pt stated she could come in on 7/30 but everything else has to be scheduled on Wednesday. Pt is aware of appt times and dates

## 2020-01-28 NOTE — Telephone Encounter (Signed)
See if patient is able to come in tomorrow at 1115 with me for an in office visit.Faythe Ghee to double book.  Please order chest x-ray before.If she is unable to make this appointment as well please respond back and let me know.[name], FNP

## 2020-01-29 ENCOUNTER — Other Ambulatory Visit: Payer: Self-pay | Admitting: Pulmonary Disease

## 2020-01-29 ENCOUNTER — Other Ambulatory Visit: Payer: Self-pay

## 2020-01-29 ENCOUNTER — Encounter: Payer: Self-pay | Admitting: Pulmonary Disease

## 2020-01-29 ENCOUNTER — Ambulatory Visit (INDEPENDENT_AMBULATORY_CARE_PROVIDER_SITE_OTHER): Payer: Medicaid Other | Admitting: Pulmonary Disease

## 2020-01-29 ENCOUNTER — Ambulatory Visit (INDEPENDENT_AMBULATORY_CARE_PROVIDER_SITE_OTHER): Payer: Medicaid Other

## 2020-01-29 VITALS — BP 118/60 | HR 75 | Temp 97.7°F | Ht 64.0 in | Wt 141.8 lb

## 2020-01-29 DIAGNOSIS — R0602 Shortness of breath: Secondary | ICD-10-CM | POA: Diagnosis not present

## 2020-01-29 DIAGNOSIS — C50919 Malignant neoplasm of unspecified site of unspecified female breast: Secondary | ICD-10-CM | POA: Diagnosis not present

## 2020-01-29 DIAGNOSIS — R062 Wheezing: Secondary | ICD-10-CM | POA: Diagnosis not present

## 2020-01-29 DIAGNOSIS — R05 Cough: Secondary | ICD-10-CM | POA: Diagnosis not present

## 2020-01-29 DIAGNOSIS — K219 Gastro-esophageal reflux disease without esophagitis: Secondary | ICD-10-CM | POA: Diagnosis not present

## 2020-01-29 MED ORDER — PREDNISONE 10 MG PO TABS: ORAL_TABLET | ORAL | 0 refills | Status: DC

## 2020-01-29 MED ORDER — BUDESONIDE-FORMOTEROL FUMARATE 160-4.5 MCG/ACT IN AERO
2.0000 | INHALATION_SPRAY | Freq: Two times a day (BID) | RESPIRATORY_TRACT | 6 refills | Status: DC
Start: 2020-01-29 — End: 2020-08-21

## 2020-01-29 MED FILL — SYMBICORT 160-4.5 MCG INH: 160-4.5 | 30 days supply | Qty: 10 | Fill #0

## 2020-01-29 MED FILL — predniSONE 10 MG TABS: 10 | 5 days supply | Qty: 10 | Fill #0

## 2020-01-29 NOTE — Assessment & Plan Note (Signed)
Plan: Continue follow-up with oncology 

## 2020-01-29 NOTE — Progress Notes (Signed)
@Patient  ID: Teresa Hays, female    DOB: 1955-09-08, 64 y.o.   MRN: [id number]  Chief Complaint  Patient presents with  . Acute Visit    c/o increased wheezing started last week - increased SOB on exertion- prod cough ( mild yellow), worse at night - Denies fever, chills ,sweats    Referring provider: [name], DO  HPI:  64 year old female never smoker initially referred to our office in November/2021 for evaluation of cough  PMH: Metastatic breast cancer, allergic rhinitis, anemia Smoker/ Smoking History: Never smoker Maintenance: Breztri  Pt of: Dr. Valeta Harms  01/29/2020  - Visit   64 year old female never smoker followed in our office for chronic cough.  Patient is also followed by Dr. Burr Medico with oncology for metastatic breast cancer.  Patient was last seen in our office in May/2021 for wheezing as well as cough and congestion.  Patient was treated with a prednisone taper as well as given a therapeutic trial of ICS/LABA/LAMA.  Patient responded well and breathing returned back to baseline.  At patient's request we tried to transition the patient off of inhalers.  She was transitioned off of ICS/LABA/LAMA agent about 4 weeks ago.  She reports that she started to have worsened wheezing, shortness of breath with exertion over the last week.  Patient was supposed to have pulmonary function testing which was ordered in May/2021 but these have not been completed yet.  We will review this today.  Patient having upcoming appointment with gastroenterology for evaluation of GERD, cough at oncology's request.  Questionaires / Pulmonary Flowsheets:   ACT:  No flowsheet data found.  MMRC: mMRC Dyspnea Scale mMRC Score  11/23/2019 3    Epworth:  No flowsheet data found.  Tests:   05/15/2019 CT scan of the chest: CTA of the chest abdomen pelvis revealing stable adenopathy and hepatic lesions consistent with her metastatic breast cancer. Lung parenchyma reviewed.  No  obvious interstitial disease.  11/23/2019-chest x-ray-no acute abnormalities, mild chronic bronchitic changes  09/19/2019-CT chest-tiny stable benign nodule of right pulmonary apex measuring 3 mm, no pleural effusion or pneumothorax  01/23/2020-CT chest with contrast-4 mm right lower lobe nodule is unchanged, 3 mm right apical nodules unchanged, no suspicious pulmonary nodule or mass, no focal airspace consolidation, no pleural effusion  01/29/2020 chest x-ray-no edema or airspace opacity, there is felt to be a degree of bronchitic change bilaterally, nodular opacity overlying the anterior heart border and lateral view likely arises from the overlying breast tissue, recent CT does not show a lesion within the lung parenchyma in this region  FENO:  No results found for: NITRICOXIDE  PFT: No flowsheet data found.  WALK:  No flowsheet data found.  Imaging: DG Chest 2 View  Result Date: 01/29/2020 CLINICAL DATA:  Shortness of breath with cough and wheezing. Breast carcinoma EXAM: CHEST - 2 VIEW COMPARISON:  Chest CT January 23, 2020; chest radiograph Nov 23, 2019 FINDINGS: Port-A-Cath tip is in the superior vena cava. No pneumothorax. Lungs are clear. A 1.1 x 0.7 cm nodular opacity overlying the anterior heart border on lateral view is not seen as residing in the lung parenchyma on CT from 6 days prior and presumably may represent an opacity arising from overlying breast tissue. There is felt to be a degree of lower lobe bronchiectatic change. Heart size and pulmonary vascular normal. No adenopathy. No blastic or lytic bone lesions evident. IMPRESSION: No edema or airspace opacity. There is felt to be a degree of bronchiectatic change  bilaterally. A nodular opacity overlying the anterior heart border on the lateral view likely arises from overlying breast tissue. Recent CT does not show a lesion within the lung parenchyma in this region. Port-A-Cath tip in superior vena cava. Electronically Signed   By:  Lowella Grip III M.D.   On: 01/29/2020 11:21   CT Chest W Contrast  Result Date: 01/23/2020 CLINICAL DATA:  Breast cancer.  Liver metastases.  Restaging. EXAM: CT CHEST, ABDOMEN, AND PELVIS WITH CONTRAST TECHNIQUE: Multidetector CT imaging of the chest, abdomen and pelvis was performed following the standard protocol during bolus administration of intravenous contrast. CONTRAST:  146mL OMNIPAQUE IOHEXOL 300 MG/ML  SOLN COMPARISON:  09/19/2019. FINDINGS: CT CHEST FINDINGS Cardiovascular: The heart size is normal. No substantial pericardial effusion. Atherosclerotic calcification is noted in the wall of the thoracic aorta. Left Port-A-Cath tip is positioned in the distal SVC near the RA junction. Mediastinum/Nodes: No mediastinal lymphadenopathy. There is no hilar lymphadenopathy. The esophagus has normal imaging features. The upper normal right axillary nodes are similar in the interval. Index node measured previously at 6 mm short axis is 8 mm short axis today (13/2). A second, more medial right axillary node measuring 6 mm short axis on 13/2 is stable compared to 6 mm short axis on the previous study when I remeasure in a similar fashion. Stable tiny left axillary and subpectoral lymph nodes. Lungs/Pleura: 4 mm right lower lobe nodule on 62/6 is unchanged. 3 mm right apical nodule (19/6) is unchanged. No new suspicious pulmonary nodule or mass. No focal airspace consolidation. No pleural effusion. Musculoskeletal: No worrisome lytic or sclerotic osseous abnormality. CT ABDOMEN PELVIS FINDINGS Hepatobiliary: Ill-defined hypoattenuating lesions in the liver show no substantial interval change. 15 mm lesion anterior to the IVC and the confluence with the middle hepatic vein is slightly more conspicuous today but that may well be related to bolus timing. No new suspicious liver lesion on today's study. Probable 15 mm noncalcified gallstone. No intrahepatic or extrahepatic biliary dilation. Pancreas: No focal  mass lesion. No dilatation of the main duct. No intraparenchymal cyst. No peripancreatic edema. Spleen: No splenomegaly. No focal mass lesion. Adrenals/Urinary Tract: Left adrenal gland unremarkable. Stable 10 mm right adrenal nodule Kidneys unremarkable. No evidence for hydroureter. The urinary bladder appears normal for the degree of distention. Stomach/Bowel: Stomach is unremarkable. No gastric wall thickening. No evidence of outlet obstruction. Duodenum is normally positioned as is the ligament of Treitz. No small bowel wall thickening. No small bowel dilatation. The terminal ileum is normal. The appendix is normal. No gross colonic mass. Apparent wall thickening in the distal sigmoid colon and rectum may be related to underdistention. Vascular/Lymphatic: There is abdominal aortic atherosclerosis without aneurysm. There is no gastrohepatic or hepatoduodenal ligament lymphadenopathy. No retroperitoneal or mesenteric lymphadenopathy. No pelvic sidewall lymphadenopathy. Reproductive: The uterus is unremarkable.  There is no adnexal mass. Other: No intraperitoneal free fluid. Musculoskeletal: No worrisome lytic or sclerotic osseous abnormality. IMPRESSION: 1. Stable exam. No new or progressive interval findings. 2. Stable appearance of upper normal right axillary lymph nodes. Tiny subpectoral and left axillary nodes are unchanged. 3. Generally similar appearance of ill-defined hypoattenuating lesions in the liver compatible with treated metastases. 1 lesion in the central liver is minimally more conspicuous today, likely related to bolus timing and attention on follow-up recommended. No new suspicious liver lesion on today's study. 4. Stable 10 mm right adrenal nodule., indeterminate. Continued attention on follow-up imaging recommended. 5. Cholelithiasis. 6. Aortic Atherosclerosis (ICD10-I70.0). Electronically Signed  By: Misty Stanley M.D.   On: 01/23/2020 14:03   CT Abdomen Pelvis W Contrast  Result Date:  01/23/2020 CLINICAL DATA:  Breast cancer.  Liver metastases.  Restaging. EXAM: CT CHEST, ABDOMEN, AND PELVIS WITH CONTRAST TECHNIQUE: Multidetector CT imaging of the chest, abdomen and pelvis was performed following the standard protocol during bolus administration of intravenous contrast. CONTRAST:  18mL OMNIPAQUE IOHEXOL 300 MG/ML  SOLN COMPARISON:  09/19/2019. FINDINGS: CT CHEST FINDINGS Cardiovascular: The heart size is normal. No substantial pericardial effusion. Atherosclerotic calcification is noted in the wall of the thoracic aorta. Left Port-A-Cath tip is positioned in the distal SVC near the RA junction. Mediastinum/Nodes: No mediastinal lymphadenopathy. There is no hilar lymphadenopathy. The esophagus has normal imaging features. The upper normal right axillary nodes are similar in the interval. Index node measured previously at 6 mm short axis is 8 mm short axis today (13/2). A second, more medial right axillary node measuring 6 mm short axis on 13/2 is stable compared to 6 mm short axis on the previous study when I remeasure in a similar fashion. Stable tiny left axillary and subpectoral lymph nodes. Lungs/Pleura: 4 mm right lower lobe nodule on 62/6 is unchanged. 3 mm right apical nodule (19/6) is unchanged. No new suspicious pulmonary nodule or mass. No focal airspace consolidation. No pleural effusion. Musculoskeletal: No worrisome lytic or sclerotic osseous abnormality. CT ABDOMEN PELVIS FINDINGS Hepatobiliary: Ill-defined hypoattenuating lesions in the liver show no substantial interval change. 15 mm lesion anterior to the IVC and the confluence with the middle hepatic vein is slightly more conspicuous today but that may well be related to bolus timing. No new suspicious liver lesion on today's study. Probable 15 mm noncalcified gallstone. No intrahepatic or extrahepatic biliary dilation. Pancreas: No focal mass lesion. No dilatation of the main duct. No intraparenchymal cyst. No peripancreatic  edema. Spleen: No splenomegaly. No focal mass lesion. Adrenals/Urinary Tract: Left adrenal gland unremarkable. Stable 10 mm right adrenal nodule Kidneys unremarkable. No evidence for hydroureter. The urinary bladder appears normal for the degree of distention. Stomach/Bowel: Stomach is unremarkable. No gastric wall thickening. No evidence of outlet obstruction. Duodenum is normally positioned as is the ligament of Treitz. No small bowel wall thickening. No small bowel dilatation. The terminal ileum is normal. The appendix is normal. No gross colonic mass. Apparent wall thickening in the distal sigmoid colon and rectum may be related to underdistention. Vascular/Lymphatic: There is abdominal aortic atherosclerosis without aneurysm. There is no gastrohepatic or hepatoduodenal ligament lymphadenopathy. No retroperitoneal or mesenteric lymphadenopathy. No pelvic sidewall lymphadenopathy. Reproductive: The uterus is unremarkable.  There is no adnexal mass. Other: No intraperitoneal free fluid. Musculoskeletal: No worrisome lytic or sclerotic osseous abnormality. IMPRESSION: 1. Stable exam. No new or progressive interval findings. 2. Stable appearance of upper normal right axillary lymph nodes. Tiny subpectoral and left axillary nodes are unchanged. 3. Generally similar appearance of ill-defined hypoattenuating lesions in the liver compatible with treated metastases. 1 lesion in the central liver is minimally more conspicuous today, likely related to bolus timing and attention on follow-up recommended. No new suspicious liver lesion on today's study. 4. Stable 10 mm right adrenal nodule., indeterminate. Continued attention on follow-up imaging recommended. 5. Cholelithiasis. 6. Aortic Atherosclerosis (ICD10-I70.0). Electronically Signed   By: Misty Stanley M.D.   On: 01/23/2020 14:03    Lab Results:  CBC    Component Value Date/Time   WBC 7.6 01/23/2020 0800   WBC 3.2 (L) 04/25/2018 2030   RBC 3.87 01/23/2020  0800   HGB 11.3 (L) 01/23/2020 0800   HCT 35.2 (L) 01/23/2020 0800   HCT 34.2 01/04/2020 0809   PLT 204 01/23/2020 0800   MCV 91.0 01/23/2020 0800   MCH 29.2 01/23/2020 0800   MCHC 32.1 01/23/2020 0800   RDW 14.3 01/23/2020 0800   LYMPHSABS 2.2 01/23/2020 0800   MONOABS 0.3 01/23/2020 0800   EOSABS 0.8 (H) 01/23/2020 0800   BASOSABS 0.0 01/23/2020 0800    BMET    Component Value Date/Time   NA 139 01/23/2020 0800   K 4.1 01/23/2020 0800   CL 107 01/23/2020 0800   CO2 21 (L) 01/23/2020 0800   GLUCOSE 94 01/23/2020 0800   BUN 10 01/23/2020 0800   CREATININE 1.25 (H) 01/23/2020 0800   CALCIUM 9.8 01/23/2020 0800   GFRNONAA 46 (L) 01/23/2020 0800   GFRAA 53 (L) 01/23/2020 0800    BNP    Component Value Date/Time   BNP 9.3 05/29/2019 1016    ProBNP No results found for: PROBNP  Specialty Problems      Pulmonary Problems   Shortness of breath   Cough, persistent      No Known Allergies  Immunization History  Administered Date(s) Administered  . Influenza,inj,Quad PF,6+ Mos 04/28/2018, 04/06/2019  . PFIZER SARS-COV-2 Vaccination 09/30/2019, 10/20/2019  . Tdap 11/23/2019    Past Medical History:  Diagnosis Date  . GERD (gastroesophageal reflux disease)   . Hypertension   . rt breast ca with mets to liver dx'd 03/2018    Tobacco History: Social History   Tobacco Use  Smoking Status Never Smoker  Smokeless Tobacco Never Used   Counseling given: Not Answered   Continue to not smoke  Outpatient Encounter Medications as of 01/29/2020  Medication Sig  . albuterol (VENTOLIN HFA) 108 (90 Base) MCG/ACT inhaler Inhale 1-2 puffs into the lungs every 6 (six) hours as needed for wheezing or shortness of breath.  Marland Kitchen amLODipine (NORVASC) 5 MG tablet TAKE 1 TABLET (5 MG TOTAL) BY MOUTH DAILY.  . carvedilol (COREG) 3.125 MG tablet Take 1 tablet (3.125 mg total) by mouth 2 (two) times daily.  . diphenoxylate-atropine (LOMOTIL) 2.5-0.025 MG tablet Take 1-2 tablets by  mouth 4 (four) times daily as needed for diarrhea or loose stools.  . DULoxetine (CYMBALTA) 20 MG capsule Take 1 capsule (20 mg total) by mouth daily.  Marland Kitchen gabapentin (NEURONTIN) 300 MG capsule May take up to 3 capsules three times daily  . letrozole (FEMARA) 2.5 MG tablet TAKE 1 TABLET (2.5 MG TOTAL) BY MOUTH DAILY.  Marland Kitchen lidocaine-prilocaine (EMLA) cream Apply to affected area once  . losartan (COZAAR) 50 MG tablet Take 1 tablet (50 mg total) by mouth daily.  Marland Kitchen omeprazole (PRILOSEC) 40 MG capsule Take 1 capsule (40 mg total) by mouth 2 (two) times daily.  . potassium chloride SA (KLOR-CON) 20 MEQ tablet TAKE 1 TABLET BY MOUTH ONCE DAILY  . rivaroxaban (XARELTO) 20 MG TABS tablet TAKE 1 TABLET BY MOUTH DAILY WITH SUPPER  . spironolactone (ALDACTONE) 25 MG tablet TAKE 1 TABLET BY MOUTH ONCE DAILY  . traMADol (ULTRAM) 50 MG tablet Take 1 tablet (50 mg total) by mouth every 6 (six) hours as needed for moderate pain or severe pain.  . budesonide-formoterol (SYMBICORT) 160-4.5 MCG/ACT inhaler Inhale 2 puffs into the lungs 2 (two) times daily. Rinse mouth after each use.  . predniSONE (DELTASONE) 10 MG tablet Take 2 tablets (20mg  total) daily for the next 5 days. Take in the AM with food.  . [  DISCONTINUED] predniSONE (DELTASONE) 10 MG tablet 4 tabs for 2 days, then 3 tabs for 2 days, 2 tabs for 2 days, then 1 tab for 2 days, then stop (Patient not taking: Reported on 01/29/2020)   No facility-administered encounter medications on file as of 01/29/2020.     Review of Systems  Review of Systems  Constitutional: Negative for activity change, fatigue and fever.  HENT: Positive for congestion. Negative for sinus pressure, sinus pain and sore throat.   Respiratory: Positive for cough, shortness of breath and wheezing.   Cardiovascular: Negative for chest pain and palpitations.  Gastrointestinal: Negative for diarrhea, nausea and vomiting.  Musculoskeletal: Negative for arthralgias.  Neurological: Negative  for dizziness.  Psychiatric/Behavioral: Negative for sleep disturbance. The patient is not nervous/anxious.      Physical Exam  BP 118/60   Pulse 75   Temp 97.7 F (36.5 C) (Oral)   Ht 5\' 4"  (1.626 m)   Wt 141 lb 12.8 oz (64.3 kg)   SpO2 96%   BMI 24.34 kg/m   Wt Readings from Last 5 Encounters:  01/29/20 141 lb 12.8 oz (64.3 kg)  01/25/20 142 lb 3.2 oz (64.5 kg)  01/04/20 140 lb 11.2 oz (63.8 kg)  12/12/19 139 lb 4 oz (63.2 kg)  12/12/19 136 lb 9.6 oz (62 kg)    BMI Readings from Last 5 Encounters:  01/29/20 24.34 kg/m  01/25/20 24.41 kg/m  01/04/20 24.15 kg/m  12/12/19 23.90 kg/m  12/12/19 23.45 kg/m     Physical Exam Vitals and nursing note reviewed.  Constitutional:      General: She is not in acute distress.    Appearance: Normal appearance. She is normal weight.  HENT:     Head: Normocephalic and atraumatic.     Right Ear: External ear normal.     Left Ear: External ear normal.     Nose: Rhinorrhea present. No congestion.     Mouth/Throat:     Mouth: Mucous membranes are moist.     Pharynx: Oropharynx is clear.  Eyes:     Pupils: Pupils are equal, round, and reactive to light.  Cardiovascular:     Rate and Rhythm: Normal rate and regular rhythm.     Pulses: Normal pulses.     Heart sounds: Normal heart sounds. No murmur heard.   Pulmonary:     Breath sounds: No decreased air movement. Wheezing present. No decreased breath sounds or rales.  Musculoskeletal:     Cervical back: Normal range of motion.  Skin:    General: Skin is warm and dry.     Capillary Refill: Capillary refill takes less than 2 seconds.  Neurological:     General: No focal deficit present.     Mental Status: She is alert and oriented to person, place, and time. Mental status is at baseline.     Gait: Gait normal.  Psychiatric:        Mood and Affect: Mood normal.        Behavior: Behavior normal.        Thought Content: Thought content normal.        Judgment: Judgment  normal.       Assessment & Plan:   Metastatic breast cancer ([place]) Plan: Continue follow-up with oncology  Shortness of breath Suspected asthmatic bronchitis Recent episode in May/2021 Responsive to prednisone as well as ICS/LABA/LAMA therapy Utilized ICS/LABA/LAMA therapy previously due to availability with samples  Plan: Prednisone taper today Start ICS/LABA agent-Symbicort 80 Patient may be  able to achieve as needed use of ICS/LABA agent eventually, for right now patient to take as prescribed Patient needs to complete pulmonary function testing as outlined and discussed today  Gastroesophageal reflux disease Plan: Continue follow-up with gastroenterology as planned    Return in about 2 months (around 03/31/2020), or if symptoms worsen or fail to improve, for Follow up with Dr. Valeta Harms, Follow up for FULL PFT - 60 min.   Lauraine Rinne, NP 01/29/2020   This appointment required 32 minutes of patient care (this includes precharting, chart review, review of results, face-to-face care, etc.).

## 2020-01-29 NOTE — Assessment & Plan Note (Signed)
Suspected asthmatic bronchitis Recent episode in May/2021 Responsive to prednisone as well as ICS/LABA/LAMA therapy Utilized ICS/LABA/LAMA therapy previously due to availability with samples  Plan: Prednisone taper today Start ICS/LABA agent-Symbicort 80 Patient may be able to achieve as needed use of ICS/LABA agent eventually, for right now patient to take as prescribed Patient needs to complete pulmonary function testing as outlined and discussed today

## 2020-01-29 NOTE — Progress Notes (Signed)
Discussed results with patient in office.  Nothing further is needed at this time.  Recent CT from 01/23/20 shows no pulm nodule / lesion. Will route to oncology as FYI.   [name] FNP

## 2020-01-29 NOTE — Assessment & Plan Note (Signed)
Plan: Continue follow-up with gastroenterology as planned

## 2020-01-29 NOTE — Patient Instructions (Addendum)
You were seen today by Lauraine Rinne, NP  for:  1. Shortness of breath  Prednisone 10mg  tablet  >>>Take 2 tablets (20 mg total) daily for the next 5 days >>> Take with food in the morning  Start Symbicort 80 >>> 2 puffs in the morning right when you wake up, rinse out your mouth after use, 12 hours later 2 puffs, rinse after use >>> Take this daily, no matter what >>> This is not a rescue inhaler   Only use your albuterol as a rescue medication to be used if you can't catch your breath by resting or doing a relaxed purse lip breathing pattern.  - The less you use it, the better it will work when you need it. - Ok to use up to 2 puffs  every 4 hours if you must but call for immediate appointment if use goes up over your usual need - Don't leave home without it !!  (think of it like the spare tire for your car)    2. Metastatic breast cancer [facility])  Keep follow up with oncology   3. Gastroesophageal reflux disease, unspecified whether esophagitis present  Keep follow up with GI     Follow Up:    Return in about 2 months (around 03/31/2020), or if symptoms worsen or fail to improve, for Follow up with Dr. Valeta Harms, Follow up for FULL PFT - 60 min.  If no availability with Dr. Valeta Harms ok to schedule with [name] FNP        Please do your part to reduce the spread of COVID-19:      Reduce your risk of any infection  and COVID19 by using the similar precautions used for avoiding the common cold or flu:  Marland Kitchen Wash your hands often with soap and warm water for at least 20 seconds.  If soap and water are not readily available, use an alcohol-based hand sanitizer with at least 60% alcohol.  . If coughing or sneezing, cover your mouth and nose by coughing or sneezing into the elbow areas of your shirt or coat, into a tissue or into your sleeve (not your hands). Langley Gauss A MASK when in public  . Avoid shaking hands with others and consider head nods or verbal greetings only. . Avoid  touching your eyes, nose, or mouth with unwashed hands.  . Avoid close contact with people who are sick. . Avoid places or events with large numbers of people in one location, like concerts or sporting events. . If you have some symptoms but not all symptoms, continue to monitor at home and seek medical attention if your symptoms worsen. . If you are having a medical emergency, call 911.   [place] / e-Visit: [email or web address]         [facility]: [facility]: [phone]                   [facility]: [phone]     It is flu season:   >>> Best ways to protect herself from the flu: Receive the yearly flu vaccine, practice good hand hygiene washing with soap and also using hand sanitizer when available, eat a nutritious meals, get adequate rest, hydrate appropriately   Please contact the office if your symptoms worsen or you have concerns that you are not improving.   Thank you for choosing [facility] for your healthcare, and for  allowing [place] to partner with you on your healthcare journey. I am thankful to be able to provide care to you today.   [name] FNP-C

## 2020-01-31 ENCOUNTER — Other Ambulatory Visit: Payer: Self-pay | Admitting: Hematology

## 2020-01-31 MED FILL — POTASSIUM CHLORIDE CRYS ER: 20 | 30 days supply | Qty: 30 | Fill #0

## 2020-01-31 MED FILL — LETROZOLE 2.5 MG TABLET: 2.5 | 90 days supply | Qty: 90 | Fill #0

## 2020-02-06 ENCOUNTER — Other Ambulatory Visit: Payer: Self-pay | Admitting: Hematology

## 2020-02-06 DIAGNOSIS — C50919 Malignant neoplasm of unspecified site of unspecified female breast: Secondary | ICD-10-CM

## 2020-02-06 DIAGNOSIS — Z853 Personal history of malignant neoplasm of breast: Secondary | ICD-10-CM

## 2020-02-06 MED FILL — GABAPENTIN 300 MG CAPSULE: 300 | 30 days supply | Qty: 270 | Fill #2

## 2020-02-06 MED FILL — OMEPRAZOLE 40 MG CPDR: 40 | 30 days supply | Qty: 60 | Fill #2

## 2020-02-07 ENCOUNTER — Other Ambulatory Visit: Payer: Self-pay

## 2020-02-07 ENCOUNTER — Ambulatory Visit
Admission: RE | Admit: 2020-02-07 | Discharge: 2020-02-07 | Disposition: A | Discharge status: Home / Self Care | Payer: Medicaid Other | Source: Ambulatory Visit | Attending: Hematology | Admitting: Hematology

## 2020-02-07 ENCOUNTER — Other Ambulatory Visit: Payer: Medicaid Other

## 2020-02-07 DIAGNOSIS — Z853 Personal history of malignant neoplasm of breast: Secondary | ICD-10-CM

## 2020-02-07 DIAGNOSIS — R928 Other abnormal and inconclusive findings on diagnostic imaging of breast: Secondary | ICD-10-CM | POA: Diagnosis not present

## 2020-02-07 HISTORY — DX: Personal history of antineoplastic chemotherapy: Z92.21

## 2020-02-08 ENCOUNTER — Telehealth: Payer: Self-pay | Admitting: *Deleted

## 2020-02-08 DIAGNOSIS — C50919 Malignant neoplasm of unspecified site of unspecified female breast: Secondary | ICD-10-CM

## 2020-02-08 NOTE — Telephone Encounter (Signed)
Received call from [place], the new patient coordinator at [facility] regarding patient.  She states that patient is needing an urgent referral placed with them but has [place] and will need this from [place].  Have been trying to reach [place] on what the referral is pertaining to as we haven't seen patient for this.  Will send to MD as an [place] and wait to hear back from [place].  Timoteo Carreiro,CMA

## 2020-02-11 ENCOUNTER — Other Ambulatory Visit: Payer: Self-pay

## 2020-02-11 ENCOUNTER — Ambulatory Visit
Admission: RE | Admit: 2020-02-11 | Discharge: 2020-02-11 | Disposition: A | Discharge status: Home / Self Care | Payer: Medicaid Other | Source: Ambulatory Visit | Attending: Hematology | Admitting: Hematology

## 2020-02-11 DIAGNOSIS — C801 Malignant (primary) neoplasm, unspecified: Secondary | ICD-10-CM | POA: Diagnosis not present

## 2020-02-11 DIAGNOSIS — C787 Secondary malignant neoplasm of liver and intrahepatic bile duct: Secondary | ICD-10-CM | POA: Diagnosis not present

## 2020-02-11 DIAGNOSIS — C773 Secondary and unspecified malignant neoplasm of axilla and upper limb lymph nodes: Secondary | ICD-10-CM | POA: Diagnosis not present

## 2020-02-11 DIAGNOSIS — C50919 Malignant neoplasm of unspecified site of unspecified female breast: Secondary | ICD-10-CM

## 2020-02-11 MED ORDER — GADOBUTROL 1 MMOL/ML IV SOLN
7.0000 mL | Freq: Once | INTRAVENOUS | Status: AC | PRN
  Administered 2020-02-11: 7 mL via INTRAVENOUS

## 2020-02-12 ENCOUNTER — Other Ambulatory Visit: Payer: Self-pay | Admitting: Family Medicine

## 2020-02-12 ENCOUNTER — Other Ambulatory Visit: Payer: Self-pay

## 2020-02-12 DIAGNOSIS — C50919 Malignant neoplasm of unspecified site of unspecified female breast: Secondary | ICD-10-CM

## 2020-02-12 DIAGNOSIS — T451X5A Adverse effect of antineoplastic and immunosuppressive drugs, initial encounter: Secondary | ICD-10-CM

## 2020-02-12 DIAGNOSIS — G620 Drug-induced polyneuropathy: Secondary | ICD-10-CM

## 2020-02-12 MED ORDER — GABAPENTIN 300 MG PO CAPS: ORAL_CAPSULE | ORAL | 3 refills | Status: DC

## 2020-02-12 NOTE — Addendum Note (Signed)
Addended by: Valerie Roys on: 02/12/2020 10:02 AM   Modules accepted: Orders

## 2020-02-12 NOTE — Telephone Encounter (Signed)
Referral faxed.  Please see referral note for PA update.  Freemon Binford,CMA

## 2020-02-12 NOTE — Telephone Encounter (Signed)
Received an additional message from [place] stating that the referral needs to be placed by the pcp office due to patient's insurance.  She is needing general surgery for breast cancer.  Will forward to MD to please place asap so I can get this faxed to their office.  Markee Remlinger,CMA

## 2020-02-12 NOTE — Telephone Encounter (Signed)
- 

## 2020-02-13 ENCOUNTER — Telehealth: Payer: Self-pay

## 2020-02-13 NOTE — Telephone Encounter (Signed)
I spoke with Teresa Hays and relayed Dr. Ernestina Penna comments.  Teresa Hays has an appt with Dr. Brantley Stage on 02/18/2020.  Dr. Burr Medico wants her to have treatment this Friday.  She verbalized understanding.

## 2020-02-13 NOTE — Telephone Encounter (Signed)
-----   Message from [name], MD sent at 02/12/2020  8:26 AM EDT ----- Please let pt know her breast MRI result, this was done for surgical consideration. Let me know when she is going to see breast surgeon (I think Dr. Brantley Stage), to see if I need to see her on 7/30 when she comes in for treatment. Thanks   [name]  02/12/2020

## 2020-02-15 ENCOUNTER — Inpatient Hospital Stay: Payer: Medicaid Other

## 2020-02-15 ENCOUNTER — Other Ambulatory Visit: Payer: Self-pay

## 2020-02-15 VITALS — BP 159/94 | HR 67 | Temp 98.6°F | Resp 18 | Wt 143.5 lb

## 2020-02-15 DIAGNOSIS — Z7189 Other specified counseling: Secondary | ICD-10-CM

## 2020-02-15 DIAGNOSIS — C50919 Malignant neoplasm of unspecified site of unspecified female breast: Secondary | ICD-10-CM

## 2020-02-15 DIAGNOSIS — Z5111 Encounter for antineoplastic chemotherapy: Secondary | ICD-10-CM | POA: Diagnosis not present

## 2020-02-15 DIAGNOSIS — Z95828 Presence of other vascular implants and grafts: Secondary | ICD-10-CM

## 2020-02-15 LAB — CMP (CANCER CENTER ONLY)
ALT: 16 U/L (ref 0–44)
AST: 21 U/L (ref 15–41)
Albumin: 3.8 g/dL (ref 3.5–5.0)
Alkaline Phosphatase: 131 U/L — ABNORMAL HIGH (ref 38–126)
Anion gap: 10 (ref 5–15)
BUN: 14 mg/dL (ref 8–23)
CO2: 23 mmol/L (ref 22–32)
Calcium: 10.2 mg/dL (ref 8.9–10.3)
Chloride: 105 mmol/L (ref 98–111)
Creatinine: 1.31 mg/dL — ABNORMAL HIGH (ref 0.44–1.00)
GFR, Est AFR Am: 50 mL/min — ABNORMAL LOW (ref >60)
GFR, Estimated: 43 mL/min — ABNORMAL LOW (ref >60)
Glucose, Bld: 117 mg/dL — ABNORMAL HIGH (ref 70–99)
Potassium: 3.9 mmol/L (ref 3.5–5.1)
Sodium: 138 mmol/L (ref 135–145)
Total Bilirubin: 0.4 mg/dL (ref 0.3–1.2)
Total Protein: 7.1 g/dL (ref 6.5–8.1)

## 2020-02-15 LAB — CBC WITH DIFFERENTIAL (CANCER CENTER ONLY)
Abs Immature Granulocytes: 0.01 10*3/uL (ref 0.00–0.07)
Basophils Absolute: 0.1 10*3/uL (ref 0.0–0.1)
Basophils Relative: 1 %
Eosinophils Absolute: 0.6 10*3/uL — ABNORMAL HIGH (ref 0.0–0.5)
Eosinophils Relative: 9 %
HCT: 32.7 % — ABNORMAL LOW (ref 36.0–46.0)
Hemoglobin: 10.6 g/dL — ABNORMAL LOW (ref 12.0–15.0)
Immature Granulocytes: 0 %
Lymphocytes Relative: 27 %
Lymphs Abs: 1.9 10*3/uL (ref 0.7–4.0)
MCH: 28.9 pg (ref 26.0–34.0)
MCHC: 32.4 g/dL (ref 30.0–36.0)
MCV: 89.1 fL (ref 80.0–100.0)
Monocytes Absolute: 0.3 10*3/uL (ref 0.1–1.0)
Monocytes Relative: 5 %
Neutro Abs: 4 10*3/uL (ref 1.7–7.7)
Neutrophils Relative %: 58 %
Platelet Count: 193 10*3/uL (ref 150–400)
RBC: 3.67 MIL/uL — ABNORMAL LOW (ref 3.87–5.11)
RDW: 14 % (ref 11.5–15.5)
WBC Count: 6.9 10*3/uL (ref 4.0–10.5)
nRBC: 0 % (ref 0.0–0.2)

## 2020-02-15 MED ORDER — SODIUM CHLORIDE 0.9% FLUSH
10.0000 mL | INTRAVENOUS | Status: DC | PRN
  Administered 2020-02-15: 10 mL
  Filled 2020-02-15: qty 10

## 2020-02-15 MED ORDER — ACETAMINOPHEN 325 MG PO TABS
650.0000 mg | ORAL_TABLET | Freq: Once | ORAL | Status: AC
  Administered 2020-02-15: 650 mg via ORAL

## 2020-02-15 MED ORDER — DIPHENHYDRAMINE HCL 25 MG PO CAPS
25.0000 mg | ORAL_CAPSULE | Freq: Once | ORAL | Status: AC
  Administered 2020-02-15: 25 mg via ORAL

## 2020-02-15 MED ORDER — HEPARIN SOD (PORK) LOCK FLUSH 100 UNIT/ML IV SOLN
500.0000 [IU] | Freq: Once | INTRAVENOUS | Status: AC | PRN
  Administered 2020-02-15: 500 [IU]
  Filled 2020-02-15: qty 5

## 2020-02-15 MED ORDER — PERTUZ-TRASTUZ-HYALURON-ZZXF 60-60-2000 MG-MG-U/ML CHEMO ~~LOC~~ SOLN
10.0000 mL | Freq: Once | SUBCUTANEOUS | Status: AC
  Administered 2020-02-15: 10 mL via SUBCUTANEOUS
  Filled 2020-02-15: qty 10

## 2020-02-15 MED ORDER — ACETAMINOPHEN 325 MG PO TABS
ORAL_TABLET | ORAL | Status: AC
  Filled 2020-02-15: qty 2

## 2020-02-15 MED ORDER — DIPHENHYDRAMINE HCL 25 MG PO CAPS
ORAL_CAPSULE | ORAL | Status: AC
  Filled 2020-02-15: qty 1

## 2020-02-15 NOTE — Patient Instructions (Signed)
[facility] Discharge Instructions for Patients Receiving Chemotherapy  Today you received the following immunotherapy agents:  Pertuzumab/Trastuzumab Hyaluronidase SQ  To help prevent nausea and vomiting after your treatment, we encourage you to take your nausea medication as needed & prescribed.  If you develop nausea and vomiting that is not controlled by your nausea medication, call the clinic.   BELOW ARE SYMPTOMS THAT SHOULD BE REPORTED IMMEDIATELY:  *FEVER GREATER THAN 100.5 F  *CHILLS WITH OR WITHOUT FEVER  NAUSEA AND VOMITING THAT IS NOT CONTROLLED WITH YOUR NAUSEA MEDICATION  *UNUSUAL SHORTNESS OF BREATH  *UNUSUAL BRUISING OR BLEEDING  TENDERNESS IN MOUTH AND THROAT WITH OR WITHOUT PRESENCE OF ULCERS  *URINARY PROBLEMS  *BOWEL PROBLEMS  UNUSUAL RASH Items with * indicate a potential emergency and should be followed up as soon as possible.  Feel free to call the clinic should you have any questions or concerns. The clinic phone number is [phone].  Please show the [facility] at check-in to the Emergency Department and triage nurse.

## 2020-02-16 LAB — CANCER ANTIGEN 27.29: CA 27.29: 50.2 U/mL — ABNORMAL HIGH (ref 0.0–38.6)

## 2020-02-18 DIAGNOSIS — C50919 Malignant neoplasm of unspecified site of unspecified female breast: Secondary | ICD-10-CM | POA: Diagnosis not present

## 2020-02-20 ENCOUNTER — Other Ambulatory Visit (HOSPITAL_COMMUNITY): Payer: Self-pay | Admitting: Internal Medicine

## 2020-02-20 MED FILL — CARVEDILOL 3.125 MG TABLET: 3.125 | 90 days supply | Qty: 180 | Fill #0

## 2020-02-20 MED FILL — XARELTO 20 MG TABLET: 20 | 30 days supply | Qty: 30 | Fill #3

## 2020-02-27 ENCOUNTER — Ambulatory Visit: Payer: Medicaid Other | Admitting: Hematology

## 2020-02-27 MED FILL — SYMBICORT 160-4.5 MCG INH: 160-4.5 | 30 days supply | Qty: 10 | Fill #1

## 2020-02-28 ENCOUNTER — Telehealth: Payer: Self-pay | Admitting: Pulmonary Disease

## 2020-02-28 NOTE — Telephone Encounter (Signed)
Patient last OV 7/13, completed pred taper, still has cough, light green sputum, continues to have wheeze and chest congestion. Patient on Symbicort, using her Albuterol as directed. Requesting more prednisone. Please advise.

## 2020-02-29 ENCOUNTER — Ambulatory Visit: Payer: Medicaid Other | Admitting: Hematology

## 2020-02-29 ENCOUNTER — Other Ambulatory Visit (HOSPITAL_COMMUNITY): Payer: Medicaid Other

## 2020-02-29 ENCOUNTER — Encounter (HOSPITAL_COMMUNITY): Payer: Medicaid Other | Admitting: Internal Medicine

## 2020-02-29 MED ORDER — PREDNISONE 10 MG PO TABS: ORAL_TABLET | ORAL | 0 refills | Status: DC

## 2020-02-29 MED FILL — predniSONE 10 MG TABS: 10 | 12 days supply | Qty: 30 | Fill #0

## 2020-02-29 NOTE — Telephone Encounter (Signed)
Sent RX for prednisone. If not better needs follow-up with [name]

## 2020-02-29 NOTE — Telephone Encounter (Signed)
Called patient to let her know that additional prednisone has been sent to her preferred pharmacy.

## 2020-02-29 NOTE — Telephone Encounter (Signed)
Will send to APP of the day as Aaron Edelman is out of the office.

## 2020-03-01 MED FILL — DULOXETINE HCL 20 MG CPEP: 20 | 30 days supply | Qty: 30 | Fill #2

## 2020-03-01 MED FILL — AMLODIPINE BESYLATE 5 MG TA: 5 | 30 days supply | Qty: 30 | Fill #1

## 2020-03-01 MED FILL — LOSARTAN POTASSIUM 50 MG TA: 50 | 30 days supply | Qty: 30 | Fill #11

## 2020-03-03 ENCOUNTER — Other Ambulatory Visit: Payer: Self-pay | Admitting: Hematology

## 2020-03-03 MED FILL — OMEPRAZOLE 40 MG CPDR: 40 | 30 days supply | Qty: 60 | Fill #3

## 2020-03-03 NOTE — Telephone Encounter (Signed)
Thank you EW.   Pt needs follow up in September with Dr. Valeta Harms or an APP. Please also ensure she is using her Symbicort 80:   Symbicort 80 >>> 2 puffs in the morning right when you wake up, rinse out your mouth after use, 12 hours later 2 puffs, rinse after use >>> Take this daily, no matter what >>> This is not a rescue inhaler   If so then we may need to consider increasing to Symbicort 160 after next in OV follow up.   [name] FNP

## 2020-03-03 NOTE — Progress Notes (Signed)
[facility]   [phone] [phone]   Clinic Follow up Note   Patient Care Team: [name], DO as PCP - General (Family Medicine) [name], MD as Consulting Physician (Gastroenterology) [name], MD as Consulting Physician (Hematology)  Date of Service:  03/05/2020  CHIEF COMPLAINT: F/u for metastatic breast cancer  SUMMARY OF ONCOLOGIC HISTORY: Oncology History Overview Note  Cancer Staging Metastatic breast cancer [facility]) Staging form: Breast, AJCC 8th Edition - Clinical stage from 03/24/2018: Stage IV (cT2, cN1, pM1, G3, ER+, PR+, HER2+) - Signed by [name], MD on 03/30/2018     Metastatic breast cancer ([place])  01/30/2018 Procedure   Colonoscopy showed small polyp in the sigmoid colon, removed, the exam of colon including the terminal ileum was otherwise negative.   01/30/2018 Procedure   EGD by Dr. Collene Mares showed small hiatal hernia, a 8 mm polypoid lesion in the cardia, biopsied.   03/09/2018 Imaging   03/09/2018 US Abdomen IMPRESSION: 1. Mass lesions throughout the liver, consistent with metastatic disease. Liver as a somewhat nodular contour suggesting underlying hepatic cirrhosis. Inhomogeneous echotexture to the liver.  2. Cholelithiasis with mild gallbladder wall thickening. A degree of cholecystitis cannot be excluded by ultrasound.  3. Portions of pancreas obscured by gas. Visualized portions of pancreas appear normal.  4. Small right kidney. Etiology uncertain. This finding potentially may be indicative of renal artery stenosis. In this regard, question whether patient is hypertensive.   03/15/2018 Imaging   CT CAP with contrast  IMPRESSION: 1. Widespread hepatic metastasis. 2. 2.6 cm lateral right breast soft tissue nodule could represent a breast primary or an incidental benign lesion. Consider correlation with mammogram and ultrasound. 3. No definite source of primary malignancy identified within the abdomen or  pelvis. There is possible rectosigmoid junction wall thickening. Consider colonoscopy with attention to this area. 4. Distal esophageal wall thickening, suggesting esophagitis.   03/24/2018 Cancer Staging   Staging form: Breast, AJCC 8th Edition - Clinical stage from 03/24/2018: Stage IV (cT2, cN1, pM1, G3, ER+, PR+, HER2+) - Signed by [name], MD on 03/30/2018   03/24/2018 Initial Biopsy   Diagnosis 1. 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 Breast, right, needle core biopsy, 9 o'clock, 7cm from nipple - INVASIVE DUCTAL CARCINOMA. -The carcinoma is somewhat morphologically dissimilar from that in part 1. It appears grade III 3. Lymph node, needle/core biopsy, right axillary - METASTATIC CARCINOMA IN 1 OF 1 LYMPH NODE (1/1).   03/24/2018 Receptors her2   Breast biopsy: 1. Estrogen Receptor: 40%, POSITIVE, STRONG-MODERATE STAINING INTENSITY Progesterone Receptor: 70%, POSITIVE, STRONG STAINING INTENSITY Proliferation Marker Ki67: 20% HER 2 equivocal by IHC 2+, POSITIVE by FISH, ratio 2.4 and copy #4.2  2. Estrogen Receptor: 60%, POSITIVE, MODERATE STAINING INTENSITY Progesterone Receptor: 40%, POSITIVE, MODERATE STAINING INTENSITY Proliferation Marker Ki67: 20% HER2 (+) by IHC 3+   03/24/2018 Initial Diagnosis   Metastatic breast cancer ([place])   03/27/2018 Pathology Results   Diagnosis Liver, needle/core biopsy, Right - METASTATIC CARCINOMA TO LIVER, CONSISTENT WITH PATIENTS CLINICAL HISTORY OF PRIMARY BREAST CARCINOMA.  ER 80%+ PR40%+ HER2- (by [facility], IHC 2+)  Ki67 50%    03/28/2018 Pathology Results   03/28/2018 Surgical Pathology Diagnosis 1. Breast, left, needle core biopsy, 9 o'clock - FIBROCYSTIC CHANGES. - THERE IS NO EVIDENCE OF MALIGNANCY. 2. Breast, left, needle core biopsy, 2 o'clock - FIBROADENOMA. - THERE IS NO EVIDENCE OF  MALIGNANCY. - SEE COMMENT.   03/29/2018 Imaging    03/29/2018 Bone Scan IMPRESSION: No scintigraphic evidence of osseous metastatic disease.   03/30/2018 Imaging   Bone scan  IMPRESSION: No scintigraphic evidence of osseous metastatic disease.    04/07/2018 -  Chemotherapy   First line chemo weekly Taxol and herceptin/Perjeta every 3 weeks starting 04/07/18. She developed infusion reaction to taxol and it was discontinued. Added Abraxane on C1D8, 2 weeks on/1 week off.  Abraxne stopped after 10/13/18 due to worsening Neuropathy. She has continued with maintenance Herceptin injection/Perjeta q3weeks. Herceptin changed to injection on 04/06/19. Both injections switched to combination Phesgo on 10/01/19.   06/06/2018 Imaging   CT CAP IMPRESSION: 1. Generally improved appearance, with reduced axillary adenopathy and reduced enhancing component of the hepatic masses, with some of the hepatic mass is moderately smaller than on the prior exam. Reduced size of the right lateral breast mass compared to the prior 03/15/2018 exam. 2. New mild interstitial accentuation in the lungs, significance uncertain. Part of this appearance may be due to lower lung volumes on today's exam. 3. Mild wall thickening in the descending colon and upper rectum suggesting low-grade colitis/inflammation. Prominent stool throughout the colon favors constipation. 4. Other imaging findings of potential clinical significance: Aortic Atherosclerosis (ICD10-I70.0). Mild cardiomegaly. Mild nodularity in the right lower lobe appears stable. Contracted and thick-walled gallbladder.    09/18/2018 Imaging   CT CAP W Contrast 09/18/18  IMPRESSION: 1. Liver metastases have decreased in size. 2. Right breast mass, mild right axillary lymphadenopathy and scattered tiny right pulmonary nodules are all stable. 3. New mild left supraclavicular and left subpectoral lymphadenopathy, can not exclude progression of metastatic nodal disease. 4. Moderate colorectal stool volume, which may  indicate constipation. 5.  Aortic Atherosclerosis (ICD10-I70.0).   10/2018 -  Anti-estrogen oral therapy   Letrozole 2.5 mg daily starting 10/2018   01/10/2019 Imaging   CT CAP W Contrast 01/10/19  IMPRESSION: 1. Continued improvement in the hepatic metastatic lesions which have reduced in size. 2. Stable mild left supraclavicular and subpectoral adenopathy. 3. Essentially stable small right lower lobe pulmonary nodule and separate small subpleural nodule along the right hemidiaphragm. Surveillance suggested. 4. Other imaging findings of potential clinical significance: Mild cardiomegaly. Mild circumferential distal esophageal wall thickening, the most common cause would be esophagitis. Airway thickening is present, suggesting bronchitis or reactive airways disease. Airway plugging in the lower lobes and in the right middle lobe. Stable small right adrenal adenoma.   05/15/2019 Imaging   CT CAP W Contrast 05/15/19  IMPRESSION: CT CHEST IMPRESSION   1. Similar appearance of borderline supraclavicular, axillary, and subpectoral adenopathy. 2. Improved and resolved right lower lobe pulmonary nodularity. 3. Esophageal air fluid level suggests dysmotility or gastroesophageal reflux.   CT ABDOMEN AND PELVIS IMPRESSION   1. Improved hepatic metastasis. 2. Small bowel mesenteric lymph nodes which are upper normal and mildly enlarged. Likely increased and similar as detailed above. Indeterminate. Recommend attention on follow-up. 3. Cholelithiasis. 4. Motion degradation throughout the lower chest and abdomen.   09/19/2019 Imaging   CT CAP W contrast  IMPRESSION: 1. Interval decrease in size of right axillary and subpectoral lymph nodes. There are no pathologically enlarged lymph nodes remaining in the chest, abdomen, or pelvis. No new lymphadenopathy. 2. No significant change in post treatment appearance of multiple low-attenuation liver lesions, in keeping with treated  metastases. 3. No evidence of new metastatic disease in the chest, abdomen, or pelvis. 4. Aortic Atherosclerosis (ICD10-I70.0).   11/16/2019  Imaging   IMRI Brain  MPRESSION: Minimal chronic microvascular ischemic changes. No acute intracranial process.   Active bilateral maxillary sinus disease with mild frontoethmoid mucosal thickening.   01/23/2020 Imaging   CT CAP W contrast  IMPRESSION: 1. Stable exam. No new or progressive interval findings. 2. Stable appearance of upper normal right axillary lymph nodes. Tiny subpectoral and left axillary nodes are unchanged. 3. Generally similar appearance of ill-defined hypoattenuating lesions in the liver compatible with treated metastases. 1 lesion in the central liver is minimally more conspicuous today, likely related to bolus timing and attention on follow-up recommended. No new suspicious liver lesion on today's study. 4. Stable 10 mm right adrenal nodule., indeterminate. Continued attention on follow-up imaging recommended. 5. Cholelithiasis. 6. Aortic Atherosclerosis (ICD10-I70.0).   02/11/2020 Breast MRI   IMPRESSION: 1. 7 millimeter focus of residual enhancement associated with the known malignancy in the 9 o'clock location of the RIGHT breast. 2. No significant enhancement in the known malignancy in the 11:30 o'clock location of the RIGHT breast. 3. Interval resolution of axillary adenopathy.      CURRENT THERAPY:  -Herceptin injection/Perjeta q3weeksmaintenance therapy.Herceptin changed to injection on9/18/20. Both injections switched to combination Phesgo on 10/01/19. -Letrozole 2.5 mg daily starting 10/2018  INTERVAL HISTORY:  SERRITA LUETH is here for a follow up and treatment. She presents to the clinic alone. She notes she has met with surgeon Dr Brantley Stage and he did not recommend stopping treatment to have surgery. She notes more tingling in her right foot. She notes left hip pain but no right hip pain. She  notes she has had this foot tingling for some time. She notes this can wake her up. She also notes tingling of her hands since chemo. She notes she will be out of town for the whole week of 9/15. She notes she is interested in breast reduction the most   REVIEW OF SYSTEMS:   Constitutional: Denies fevers, chills or abnormal weight loss Eyes: Denies blurriness of vision Ears, nose, mouth, throat, and face: Denies mucositis or sore throat Respiratory: Denies cough, dyspnea or wheezes Cardiovascular: Denies palpitation, chest discomfort or lower extremity swelling Gastrointestinal:  Denies nausea, heartburn or change in bowel habits Skin: Denies abnormal skin rashes Lymphatics: Denies new lymphadenopathy or easy bruising Neurological: (+) tingling of right foot and hands Behavioral/Psych: Mood is stable, no new changes  All other systems were reviewed with the patient and are negative.  MEDICAL HISTORY:  Past Medical History:  Diagnosis Date  . GERD (gastroesophageal reflux disease)   . Hypertension   . Personal history of chemotherapy   . rt breast ca with mets to liver dx'd 03/2018    SURGICAL HISTORY: Past Surgical History:  Procedure Laterality Date  . BREAST BIOPSY Right 03/24/2018   CA x3  . BREAST BIOPSY Left 03/28/2018   neg  . BREAST BIOPSY Left 03/30/2018   neg  . COLONOSCOPY    . ESOPHAGOGASTRODUODENOSCOPY ENDOSCOPY    . IR IMAGING GUIDED PORT INSERTION  04/04/2018    I have reviewed the social history and family history with the patient and they are unchanged from previous note.  ALLERGIES:  has No Known Allergies.  MEDICATIONS:  Current Outpatient Medications  Medication Sig Dispense Refill  . albuterol (VENTOLIN HFA) 108 (90 Base) MCG/ACT inhaler Inhale 1-2 puffs into the lungs every 6 (six) hours as needed for wheezing or shortness of breath.    Marland Kitchen amLODipine (NORVASC) 5 MG tablet TAKE 1 TABLET (5 MG TOTAL)  BY MOUTH DAILY. 30 tablet 2  . budesonide-formoterol  (SYMBICORT) 160-4.5 MCG/ACT inhaler Inhale 2 puffs into the lungs 2 (two) times daily. Rinse mouth after each use. 1 Inhaler 6  . carvedilol (COREG) 3.125 MG tablet TAKE 1 TABLET BY MOUTH TWICE DAILY. REPLACES ATENOLOL 180 tablet 3  . diphenoxylate-atropine (LOMOTIL) 2.5-0.025 MG tablet Take 1-2 tablets by mouth 4 (four) times daily as needed for diarrhea or loose stools. 60 tablet 1  . DULoxetine (CYMBALTA) 20 MG capsule Take 1 capsule (20 mg total) by mouth daily. 30 capsule 2  . gabapentin (NEURONTIN) 300 MG capsule May take up to 3 capsules three times daily 270 capsule 3  . letrozole (FEMARA) 2.5 MG tablet TAKE 1 TABLET BY MOUTH ONCE DAILY 90 tablet 1  . lidocaine-prilocaine (EMLA) cream Apply to affected area once 30 g 3  . losartan (COZAAR) 50 MG tablet Take 1 tablet (50 mg total) by mouth daily. 90 tablet 3  . omeprazole (PRILOSEC) 40 MG capsule Take 1 capsule (40 mg total) by mouth 2 (two) times daily. 60 capsule 3  . potassium chloride SA (KLOR-CON) 20 MEQ tablet TAKE 1 TABLET BY MOUTH ONCE DAILY 30 tablet 0  . predniSONE (DELTASONE) 10 MG tablet Take 4 tabs po daily x 3 days; then 3 tabs daily x3 days; then 2 tabs daily x3 days; then 1 tab daily x 3 days; then stop 30 tablet 0  . rivaroxaban (XARELTO) 20 MG TABS tablet TAKE 1 TABLET BY MOUTH DAILY WITH SUPPER 30 tablet 5  . spironolactone (ALDACTONE) 25 MG tablet TAKE 1 TABLET BY MOUTH ONCE DAILY 90 tablet 3  . traMADol (ULTRAM) 50 MG tablet Take 1 tablet (50 mg total) by mouth every 6 (six) hours as needed for moderate pain or severe pain. 30 tablet 0   No current facility-administered medications for this visit.   Facility-Administered Medications Ordered in Other Visits  Medication Dose Route Frequency Provider Last Rate Last Admin  . pertuz-trastuz-hyaluron-zzxf (PHESGO) [phone] MG-MG-U/10ML chemo SQ injection maintenance dose 10 mL  10 mL Subcutaneous Once [name], MD        PHYSICAL EXAMINATION: ECOG PERFORMANCE  STATUS: 1 - Symptomatic but completely ambulatory  Vitals:   03/05/20 0921  BP: (!) 154/87  Pulse: 67  Resp: 17  Temp: (!) 96.9 F (36.1 C)  SpO2: 100%   Filed Weights   03/05/20 0921  Weight: 143 lb 11.2 oz (65.2 kg)    Due to COVID19 we will limit examination to appearance. Patient had no complaints.  GENERAL:alert, no distress and comfortable SKIN: skin color normal, no rashes or significant lesions EYES: normal, Conjunctiva are pink and non-injected, sclera clear  NEURO: alert & oriented x 3 with fluent speech   LABORATORY DATA:  I have reviewed the data as listed CBC Latest Ref Rng & Units 03/05/2020 02/15/2020 01/23/2020  WBC 4.0 - 10.5 K/uL 7.9 6.9 7.6  Hemoglobin 12.0 - 15.0 g/dL 10.4(L) 10.6(L) 11.3(L)  Hematocrit 36 - 46 % 31.8(L) 32.7(L) 35.2(L)  Platelets 150 - 400 K/uL 217 193 204     CMP Latest Ref Rng & Units 03/05/2020 02/15/2020 01/23/2020  Glucose 70 - 99 mg/dL 88 117(H) 94  BUN 8 - 23 mg/dL '19 14 10  '$ Creatinine 0.44 - 1.00 mg/dL 1.22(H) 1.31(H) 1.25(H)  Sodium 135 - 145 mmol/L 137 138 139  Potassium 3.5 - 5.1 mmol/L 3.6 3.9 4.1  Chloride 98 - 111 mmol/L 105 105 107  CO2 22 - 32 mmol/L 24  23 21(L)  Calcium 8.9 - 10.3 mg/dL 10.1 10.2 9.8  Total Protein 6.5 - 8.1 g/dL 6.8 7.1 7.6  Total Bilirubin 0.3 - 1.2 mg/dL 0.3 0.4 0.3  Alkaline Phos 38 - 126 U/L 117 131(H) 132(H)  AST 15 - 41 U/L '20 21 26  '$ ALT 0 - 44 U/L '14 16 21      '$ RADIOGRAPHIC STUDIES: I have personally reviewed the radiological images as listed and agreed with the findings in the report. No results found.   ASSESSMENT & PLAN:  Teresa Hays is a 64 y.o. female with    1. 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 Liver met was HER2-.  -Given the metastatic disease, her cancer is not curable but still treatable. Shehas been treated withLetrozole daily andFirst-line Herceptin/Perjetaevery 3 weeks and weekly Taxol. Due to infusionreaction, taxol was switched to Abraxane.We stoppedAbraxaneafter cycle 10 due to worsening neuropathyand continued with Herceptin/Perjetaas maintenance therapy. Herceptin changed to injection on9/18/20. Both injections switched to combination Phesgo on 10/01/19. -Ipreviouslydiscussedthere is a chance she can proceed with right Lumpectomy and axillary LN dissection to reduce her tumor burden. She is open to this, but she is more interested in breast reduction.  -Her CT CAP from 01/23/20 with pt and her daughter which showed stable disease in the liver with no new or progressive findings. Her 02/11/20 Breast MRI showed good response to treatment with very little residual disease in her breasts, with negative axillary nodes. Her main disease is now in her liver. I reviewed with her  -She has consulted with Dr Brantley Stage about surgery, but did not recommend surgery if she has to stop treatment. I will discuss this with him as she does not have to stop this treatment to proceed with surgery.  -Labs reviewed, stable and adequate to proceed with Phesgo injection today. Will continue every 3 weeks.  -Continue Letrozole  -f/u in 6 weeks  Next CT scan in Nov    2.Peripheral neuropathy, grade 2, Right Foot pain, Secondarytochemotherapy -Ipreviouslystropped Abraxane on 10/13/18. -Her main pain was inthe heal and ball of herright foot. She also has tingling in her hands.  -She has tried PT twice and does not feel it helps enough.She has received steroid injectionsto right foot from podiatrist which has improved her pain.  -I encouraged her to watch her balance to avoid falls.  -She is interested in more PT now, I will refer her back.  -She will also continue OTC Calcium  and Vit D, OralB12, Gabapentin '300mg'$  3 tabs TID and Cymbalta to '40mg'$ . She only feels slight tingling on high dose Gabapentin and Cymbalta.    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 Anemia, Secondary to chemo -Continue monitoring, will consider blood transfusion if hemoglobin less than 8. -Mild and stable.  5Social support  -She lives alone, has her sister and niece in [place]. Her daughter lives in a few hours away  -On Ativan PRN due to anxietyabout diagnosis and treatment  6. Goal of care discussion  -she is full code  7. Recurrentdyspnea and wheezing,Acid Reflux,Tightness of throat -She has been seen by Dr. Valeta Harms who notes this is related to her post nasal drip and her Acid reflux can contribute.  -She is on Prilosec2 tabs BID, allergy medication  and nasal spray and inhaler.Her insurance denied Dexilant.  -She knows to stop drinking Citric and Acidic juices as it triggers her Acid Reflux and Wheezing. -Continue to f/u with Dr Valeta Harms and Dr Collene Mares and pulmonary clinic.  -She is interested in [facility] booster injection.    Plan: -Send PT referral  -Send note to Dr Brantley Stage  -Labs reviewed and adequate to proceed with Phesgo injection today -continue letrozole -Lab, flush, and Phesgo injectionin 3 and 6 weeks on Wednesdays -F/u in 6 weeks    No problem-specific Assessment & Plan notes found for this encounter.   Orders Placed This Encounter  Procedures  . Ambulatory referral to Physical Therapy    Referral Priority:   Routine    Referral Type:   Physical Medicine    Referral Reason:   Specialty Services Required    Requested Specialty:   Physical Therapy    Number of Visits Requested:   1   All questions were answered. The patient knows to call the clinic with any problems, questions or concerns. No barriers to learning was detected. The total time spent in the appointment was 30 minutes.     [name], MD 03/05/2020   I, Joslyn Devon, am acting as scribe for [name], MD.   I have reviewed the above documentation for accuracy and completeness, and I agree with the above.

## 2020-03-04 MED FILL — POTASSIUM CHLORIDE CRYS ER: 20 | 30 days supply | Qty: 30 | Fill #0

## 2020-03-04 NOTE — Telephone Encounter (Signed)
Called spoke with patient.  She is scheduled for follow up with BW on 04/02/20 patient needs Wednesday afternoons.   Told patient to make sure she keeps taking the symbicort twice a day, she states she is.   Nothing further needed at this time.

## 2020-03-05 ENCOUNTER — Inpatient Hospital Stay: Payer: Medicaid Other

## 2020-03-05 ENCOUNTER — Inpatient Hospital Stay (HOSPITAL_BASED_OUTPATIENT_CLINIC_OR_DEPARTMENT_OTHER): Payer: Medicaid Other | Admitting: Hematology

## 2020-03-05 ENCOUNTER — Ambulatory Visit (INDEPENDENT_AMBULATORY_CARE_PROVIDER_SITE_OTHER): Payer: Medicaid Other | Admitting: Family Medicine

## 2020-03-05 ENCOUNTER — Encounter: Payer: Self-pay | Admitting: Family Medicine

## 2020-03-05 ENCOUNTER — Inpatient Hospital Stay: Payer: Medicaid Other | Attending: Hematology

## 2020-03-05 ENCOUNTER — Other Ambulatory Visit (HOSPITAL_COMMUNITY)
Admission: RE | Admit: 2020-03-05 | Discharge: 2020-03-05 | Disposition: A | Discharge status: Home / Self Care | Payer: Medicaid Other | Source: Ambulatory Visit | Attending: Family Medicine | Admitting: Family Medicine

## 2020-03-05 ENCOUNTER — Other Ambulatory Visit: Payer: Self-pay

## 2020-03-05 ENCOUNTER — Encounter: Payer: Self-pay | Admitting: Hematology

## 2020-03-05 VITALS — BP 160/96 | HR 65 | Temp 98.4°F | Resp 18

## 2020-03-05 VITALS — BP 154/87 | HR 67 | Temp 96.9°F | Resp 17 | Ht 64.0 in | Wt 143.7 lb

## 2020-03-05 VITALS — BP 124/86 | HR 68 | Ht 64.0 in | Wt 143.8 lb

## 2020-03-05 DIAGNOSIS — T451X5A Adverse effect of antineoplastic and immunosuppressive drugs, initial encounter: Secondary | ICD-10-CM | POA: Insufficient documentation

## 2020-03-05 DIAGNOSIS — C50919 Malignant neoplasm of unspecified site of unspecified female breast: Secondary | ICD-10-CM | POA: Diagnosis not present

## 2020-03-05 DIAGNOSIS — C773 Secondary and unspecified malignant neoplasm of axilla and upper limb lymph nodes: Secondary | ICD-10-CM | POA: Diagnosis not present

## 2020-03-05 DIAGNOSIS — K2100 Gastro-esophageal reflux disease with esophagitis, without bleeding: Secondary | ICD-10-CM | POA: Insufficient documentation

## 2020-03-05 DIAGNOSIS — G620 Drug-induced polyneuropathy: Secondary | ICD-10-CM

## 2020-03-05 DIAGNOSIS — M25552 Pain in left hip: Secondary | ICD-10-CM | POA: Diagnosis not present

## 2020-03-05 DIAGNOSIS — J320 Chronic maxillary sinusitis: Secondary | ICD-10-CM | POA: Diagnosis not present

## 2020-03-05 DIAGNOSIS — Z0000 Encounter for general adult medical examination without abnormal findings: Secondary | ICD-10-CM | POA: Insufficient documentation

## 2020-03-05 DIAGNOSIS — M79671 Pain in right foot: Secondary | ICD-10-CM | POA: Diagnosis not present

## 2020-03-05 DIAGNOSIS — Z7189 Other specified counseling: Secondary | ICD-10-CM

## 2020-03-05 DIAGNOSIS — Z95828 Presence of other vascular implants and grafts: Secondary | ICD-10-CM

## 2020-03-05 DIAGNOSIS — N270 Small kidney, unilateral: Secondary | ICD-10-CM | POA: Insufficient documentation

## 2020-03-05 DIAGNOSIS — Z170 Estrogen receptor positive status [ER+]: Secondary | ICD-10-CM | POA: Diagnosis not present

## 2020-03-05 DIAGNOSIS — Z13220 Encounter for screening for lipoid disorders: Secondary | ICD-10-CM | POA: Diagnosis not present

## 2020-03-05 DIAGNOSIS — Z5111 Encounter for antineoplastic chemotherapy: Secondary | ICD-10-CM | POA: Insufficient documentation

## 2020-03-05 DIAGNOSIS — Z79811 Long term (current) use of aromatase inhibitors: Secondary | ICD-10-CM | POA: Diagnosis not present

## 2020-03-05 DIAGNOSIS — Z01419 Encounter for gynecological examination (general) (routine) without abnormal findings: Secondary | ICD-10-CM

## 2020-03-05 DIAGNOSIS — I700 Atherosclerosis of aorta: Secondary | ICD-10-CM | POA: Diagnosis not present

## 2020-03-05 DIAGNOSIS — D6481 Anemia due to antineoplastic chemotherapy: Secondary | ICD-10-CM

## 2020-03-05 DIAGNOSIS — C787 Secondary malignant neoplasm of liver and intrahepatic bile duct: Secondary | ICD-10-CM | POA: Diagnosis not present

## 2020-03-05 DIAGNOSIS — I6782 Cerebral ischemia: Secondary | ICD-10-CM | POA: Insufficient documentation

## 2020-03-05 DIAGNOSIS — Z7901 Long term (current) use of anticoagulants: Secondary | ICD-10-CM | POA: Diagnosis not present

## 2020-03-05 DIAGNOSIS — C50811 Malignant neoplasm of overlapping sites of right female breast: Secondary | ICD-10-CM | POA: Diagnosis present

## 2020-03-05 DIAGNOSIS — Z79899 Other long term (current) drug therapy: Secondary | ICD-10-CM | POA: Insufficient documentation

## 2020-03-05 LAB — CBC WITH DIFFERENTIAL (CANCER CENTER ONLY)
Abs Immature Granulocytes: 0.02 10*3/uL (ref 0.00–0.07)
Basophils Absolute: 0 10*3/uL (ref 0.0–0.1)
Basophils Relative: 0 %
Eosinophils Absolute: 0 10*3/uL (ref 0.0–0.5)
Eosinophils Relative: 0 %
HCT: 31.8 % — ABNORMAL LOW (ref 36.0–46.0)
Hemoglobin: 10.4 g/dL — ABNORMAL LOW (ref 12.0–15.0)
Immature Granulocytes: 0 %
Lymphocytes Relative: 34 %
Lymphs Abs: 2.7 10*3/uL (ref 0.7–4.0)
MCH: 28.4 pg (ref 26.0–34.0)
MCHC: 32.7 g/dL (ref 30.0–36.0)
MCV: 86.9 fL (ref 80.0–100.0)
Monocytes Absolute: 0.5 10*3/uL (ref 0.1–1.0)
Monocytes Relative: 6 %
Neutro Abs: 4.7 10*3/uL (ref 1.7–7.7)
Neutrophils Relative %: 60 %
Platelet Count: 217 10*3/uL (ref 150–400)
RBC: 3.66 MIL/uL — ABNORMAL LOW (ref 3.87–5.11)
RDW: 14.1 % (ref 11.5–15.5)
WBC Count: 7.9 10*3/uL (ref 4.0–10.5)
nRBC: 0 % (ref 0.0–0.2)

## 2020-03-05 LAB — CMP (CANCER CENTER ONLY)
ALT: 14 U/L (ref 0–44)
AST: 20 U/L (ref 15–41)
Albumin: 3.8 g/dL (ref 3.5–5.0)
Alkaline Phosphatase: 117 U/L (ref 38–126)
Anion gap: 8 (ref 5–15)
BUN: 19 mg/dL (ref 8–23)
CO2: 24 mmol/L (ref 22–32)
Calcium: 10.1 mg/dL (ref 8.9–10.3)
Chloride: 105 mmol/L (ref 98–111)
Creatinine: 1.22 mg/dL — ABNORMAL HIGH (ref 0.44–1.00)
GFR, Est AFR Am: 55 mL/min — ABNORMAL LOW (ref >60)
GFR, Estimated: 47 mL/min — ABNORMAL LOW (ref >60)
Glucose, Bld: 88 mg/dL (ref 70–99)
Potassium: 3.6 mmol/L (ref 3.5–5.1)
Sodium: 137 mmol/L (ref 135–145)
Total Bilirubin: 0.3 mg/dL (ref 0.3–1.2)
Total Protein: 6.8 g/dL (ref 6.5–8.1)

## 2020-03-05 MED ORDER — PERTUZ-TRASTUZ-HYALURON-ZZXF 60-60-2000 MG-MG-U/ML CHEMO ~~LOC~~ SOLN
10.0000 mL | Freq: Once | SUBCUTANEOUS | Status: AC
  Administered 2020-03-05: 10 mL via SUBCUTANEOUS
  Filled 2020-03-05: qty 10

## 2020-03-05 MED ORDER — SODIUM CHLORIDE 0.9% FLUSH
10.0000 mL | INTRAVENOUS | Status: DC | PRN
  Administered 2020-03-05: 10 mL
  Filled 2020-03-05: qty 10

## 2020-03-05 MED ORDER — DIPHENHYDRAMINE HCL 25 MG PO CAPS
25.0000 mg | ORAL_CAPSULE | Freq: Once | ORAL | Status: AC
  Administered 2020-03-05: 25 mg via ORAL

## 2020-03-05 MED ORDER — ACETAMINOPHEN 325 MG PO TABS
ORAL_TABLET | ORAL | Status: AC
  Filled 2020-03-05: qty 2

## 2020-03-05 MED ORDER — DIPHENHYDRAMINE HCL 25 MG PO CAPS
ORAL_CAPSULE | ORAL | Status: AC
  Filled 2020-03-05: qty 1

## 2020-03-05 MED ORDER — ACETAMINOPHEN 325 MG PO TABS
650.0000 mg | ORAL_TABLET | Freq: Once | ORAL | Status: AC
  Administered 2020-03-05: 650 mg via ORAL

## 2020-03-05 MED ORDER — HEPARIN SOD (PORK) LOCK FLUSH 100 UNIT/ML IV SOLN
500.0000 [IU] | Freq: Once | INTRAVENOUS | Status: AC | PRN
  Administered 2020-03-05: 500 [IU]
  Filled 2020-03-05: qty 5

## 2020-03-05 NOTE — Patient Instructions (Signed)
[facility] Discharge Instructions for Patients Receiving Chemotherapy  Today you received the following chemotherapy agents Pertuz-trastuz-hyaluron-zzxf (PHESGO).  To help prevent nausea and vomiting after your treatment, we encourage you to take your nausea medication as prescribed.   If you develop nausea and vomiting that is not controlled by your nausea medication, call the clinic.   BELOW ARE SYMPTOMS THAT SHOULD BE REPORTED IMMEDIATELY:  *FEVER GREATER THAN 100.5 F  *CHILLS WITH OR WITHOUT FEVER  NAUSEA AND VOMITING THAT IS NOT CONTROLLED WITH YOUR NAUSEA MEDICATION  *UNUSUAL SHORTNESS OF BREATH  *UNUSUAL BRUISING OR BLEEDING  TENDERNESS IN MOUTH AND THROAT WITH OR WITHOUT PRESENCE OF ULCERS  *URINARY PROBLEMS  *BOWEL PROBLEMS  UNUSUAL RASH Items with * indicate a potential emergency and should be followed up as soon as possible.  Feel free to call the clinic should you have any questions or concerns. The clinic phone number is [phone].  Please show the CHEMO ALERT CARD at check-in to the Emergency Department and triage nurse.   

## 2020-03-05 NOTE — Patient Instructions (Addendum)
It was very nice to see you today. Please enjoy the rest of your week. Today you were seen for an annual exam. Follow up in 1 year for your annual exam or sooner if needed.   For normal results I will communicate via Mychart, for abnormal results I will call you.   Please call the clinic at [phone] if you have any concerns. It was our pleasure to serve you.   Preventive Care 64-64 Years Old, Female Preventive care refers to visits with your health care provider and lifestyle choices that can promote health and wellness. This includes:  A yearly physical exam. This may also be called an annual well check.  Regular dental visits and eye exams.  Immunizations.  Screening for certain conditions.  Healthy lifestyle choices, such as eating a healthy diet, getting regular exercise, not using drugs or products that contain nicotine and tobacco, and limiting alcohol use. What can I expect for my preventive care visit? Physical exam Your health care provider will check your:  Height and weight. This may be used to calculate body mass index (BMI), which tells if you are at a healthy weight.  Heart rate and blood pressure.  Skin for abnormal spots. Counseling Your health care provider may ask you questions about your:  Alcohol, tobacco, and drug use.  Emotional well-being.  Home and relationship well-being.  Sexual activity.  Eating habits.  Work and work Statistician.  Method of birth control.  Menstrual cycle.  Pregnancy history. What immunizations do I need?  Influenza (flu) vaccine  This is recommended every year. Tetanus, diphtheria, and pertussis (Tdap) vaccine  You may need a Td booster every 10 years. Varicella (chickenpox) vaccine  You may need this if you have not been vaccinated. Zoster (shingles) vaccine  You may need this after age 64. Measles, mumps, and rubella (MMR) vaccine  You may need at least one dose of MMR if you were born in 1957 or  later. You may also need a second dose. Pneumococcal conjugate (PCV13) vaccine  You may need this if you have certain conditions and were not previously vaccinated. Pneumococcal polysaccharide (PPSV23) vaccine  You may need one or two doses if you smoke cigarettes or if you have certain conditions. Meningococcal conjugate (MenACWY) vaccine  You may need this if you have certain conditions. Hepatitis A vaccine  You may need this if you have certain conditions or if you travel or work in places where you may be exposed to hepatitis A. Hepatitis B vaccine  You may need this if you have certain conditions or if you travel or work in places where you may be exposed to hepatitis B. Haemophilus influenzae type b (Hib) vaccine  You may need this if you have certain conditions. Human papillomavirus (HPV) vaccine  If recommended by your health care provider, you may need three doses over 6 months. You may receive vaccines as individual doses or as more than one vaccine together in one shot (combination vaccines). Talk with your health care provider about the risks and benefits of combination vaccines. What tests do I need? Blood tests  Lipid and cholesterol levels. These may be checked every 5 years, or more frequently if you are over 64 years old.  Hepatitis C test.  Hepatitis B test. Screening  Lung cancer screening. You may have this screening every year starting at age 64 if you have a 30-pack-year history of smoking and currently smoke or have quit within the past 15 years.  Colorectal  cancer screening. All adults should have this screening starting at age 64 and continuing until age 22. Your health care provider may recommend screening at age 25 if you are at increased risk. You will have tests every 1-10 years, depending on your results and the type of screening test.  Diabetes screening. This is done by checking your blood sugar (glucose) after you have not eaten for a while  (fasting). You may have this done every 1-3 years.  Mammogram. This may be done every 1-2 years. Talk with your health care provider about when you should start having regular mammograms. This may depend on whether you have a family history of breast cancer.  BRCA-related cancer screening. This may be done if you have a family history of breast, ovarian, tubal, or peritoneal cancers.  Pelvic exam and Pap test. This may be done every 3 years starting at age 60. Starting at age 72, this may be done every 5 years if you have a Pap test in combination with an HPV test. Other tests  Sexually transmitted disease (STD) testing.  Bone density scan. This is done to screen for osteoporosis. You may have this scan if you are at high risk for osteoporosis. Follow these instructions at home: Eating and drinking  Eat a diet that includes fresh fruits and vegetables, whole grains, lean protein, and low-fat dairy.  Take vitamin and mineral supplements as recommended by your health care provider.  Do not drink alcohol if: ? Your health care provider tells you not to drink. ? You are pregnant, may be pregnant, or are planning to become pregnant.  If you drink alcohol: ? Limit how much you have to 0-1 drink a day. ? Be aware of how much alcohol is in your drink. In the [place] one drink equals one 12 oz bottle of beer (355 mL), one 5 oz glass of wine (148 mL), or one 1 oz glass of hard liquor (44 mL). Lifestyle  Take daily care of your teeth and gums.  Stay active. Exercise for at least 30 minutes on 5 or more days each week.  Do not use any products that contain nicotine or tobacco, such as cigarettes, e-cigarettes, and chewing tobacco. If you need help quitting, ask your health care provider.  If you are sexually active, practice safe sex. Use a condom or other form of birth control (contraception) in order to prevent pregnancy and STIs (sexually transmitted infections).  If told by your health  care provider, take low-dose aspirin daily starting at age 34. What's next?  Visit your health care provider once a year for a well check visit.  Ask your health care provider how often you should have your eyes and teeth checked.  Stay up to date on all vaccines. This information is not intended to replace advice given to you by your health care provider. Make sure you discuss any questions you have with your health care provider. Document Revised: 03/16/2018 Document Reviewed: 03/16/2018 Elsevier Patient Education  2020 [employer].

## 2020-03-05 NOTE — Progress Notes (Signed)
° ° °  SUBJECTIVE:   Chief compliant/HPI: annual examination  ALIZEY NOREN is a 64 y.o. who presents today for an annual exam. She does not have any concerns today and desires to complete her pap. Declined STD testing; states she is not sexually active.   History tabs reviewed and updated: FHx of diabetes.   ROS as above.   OBJECTIVE:   BP 124/86    Pulse 68    Ht 5\' 4"  (1.626 m)    Wt 143 lb 12.8 oz (65.2 kg)    SpO2 99%    BMI 24.68 kg/m   General: Appears well, no acute distress. Age appropriate. Cardiac: RRR, normal heart sounds, no murmurs Respiratory: CTAB, normal effort Abdomen: soft, nontender, nondistended Pelvic exam: normal external genitalia, vulva, vagina, cervix, uterus and adnexa, no palpable internal organs, PAP: Pap smear done today, HPV test, exam chaperoned by Lona Millard, RN. PHQ9 SCORE ONLY 03/05/2020 11/23/2019 10/12/2019  PHQ-9 Total Score 0 0 0    ASSESSMENT/PLAN:   Annual Examination  See AVS for age appropriate recommendations  PHQ score 0, reviewed.  BP reviewed and at goal.   Considered the following items based upon USPSTF recommendations: Diabetes screening: ordered due to family history and no prior lab, pending.  Screening for elevated cholesterol: ordered, pending. HIV testing: completed prior.  Hepatitis C: completed prior  Hepatitis B: low risk   Cervical cancer screening: due for Pap today, cytology + HPV ordered Breast cancer screening: Patient is up to date and currently battling metastatic breast cancer Colorectal cancer screening: up to date on screening for CRC. Records release form signed today.  Vaccinations UTD on COVID, tdap.   Follow up in 1 year or sooner if indicated.    [name], [place]

## 2020-03-06 ENCOUNTER — Telehealth: Payer: Self-pay | Admitting: Hematology

## 2020-03-06 LAB — CYTOLOGY - PAP
Comment: NEGATIVE
Diagnosis: NEGATIVE
High risk HPV: NEGATIVE

## 2020-03-06 LAB — SPECIMEN STATUS REPORT

## 2020-03-06 NOTE — Telephone Encounter (Signed)
Scheduled per 8/18 los. Pt is aware of appts on 9/8 and 9/29. Pt requests appts on Wednesday, OK per MD.

## 2020-03-09 MED FILL — GABAPENTIN 300 MG CAPSULE: 300 | 30 days supply | Qty: 270 | Fill #3

## 2020-03-11 LAB — LIPID PANEL
Chol/HDL Ratio: 2.6 ratio (ref 0.0–4.4)
Cholesterol, Total: 156 mg/dL (ref 100–199)
HDL: 59 mg/dL (ref >39)
LDL Chol Calc (NIH): 82 mg/dL (ref 0–99)
Triglycerides: 80 mg/dL (ref 0–149)
VLDL Cholesterol Cal: 15 mg/dL (ref 5–40)

## 2020-03-11 LAB — HEMOGLOBIN A1C
Est. average glucose Bld gHb Est-mCnc: 123 mg/dL
Hgb A1c MFr Bld: 5.9 % — ABNORMAL HIGH (ref 4.8–5.6)

## 2020-03-17 MED FILL — XARELTO 20 MG TABLET: 20 | 30 days supply | Qty: 30 | Fill #4

## 2020-03-17 MED FILL — SPIRONOLACTONE 25 MG TABS: 25 | 90 days supply | Qty: 90 | Fill #1

## 2020-03-18 ENCOUNTER — Other Ambulatory Visit: Payer: Self-pay | Admitting: Nurse Practitioner

## 2020-03-20 ENCOUNTER — Encounter: Payer: Self-pay | Admitting: Physical Therapy

## 2020-03-20 ENCOUNTER — Other Ambulatory Visit: Payer: Self-pay

## 2020-03-20 ENCOUNTER — Ambulatory Visit: Payer: Medicaid Other | Attending: Hematology | Admitting: Physical Therapy

## 2020-03-20 DIAGNOSIS — R262 Difficulty in walking, not elsewhere classified: Secondary | ICD-10-CM

## 2020-03-20 DIAGNOSIS — M6281 Muscle weakness (generalized): Secondary | ICD-10-CM | POA: Diagnosis present

## 2020-03-20 DIAGNOSIS — R209 Unspecified disturbances of skin sensation: Secondary | ICD-10-CM | POA: Insufficient documentation

## 2020-03-20 DIAGNOSIS — R208 Other disturbances of skin sensation: Secondary | ICD-10-CM

## 2020-03-20 NOTE — Therapy (Signed)
Teresa Hays, [place], [address] Phone: [phone]   Fax:  [phone]  Physical Therapy Evaluation  Patient Details  Name: Teresa Hays MRN: [id number] Date of Birth: 07-30-1955 Referring Provider (PT): Annamaria Boots   Encounter Date: 03/20/2020   PT End of Session - 03/20/20 1600    Visit Number 1    Number of Visits 13    Date for PT Re-Evaluation 05/01/20    PT Start Time 2297    PT Stop Time 1445    PT Time Calculation (min) 42 min    Activity Tolerance Patient tolerated treatment well    Behavior During Therapy [facility] for tasks assessed/performed           Past Medical History:  Diagnosis Date  . GERD (gastroesophageal reflux disease)   . Hypertension   . Personal history of chemotherapy   . rt breast ca with mets to liver dx'd 03/2018    Past Surgical History:  Procedure Laterality Date  . BREAST BIOPSY Right 03/24/2018   CA x3  . BREAST BIOPSY Left 03/28/2018   neg  . BREAST BIOPSY Left 03/30/2018   neg  . COLONOSCOPY    . ESOPHAGOGASTRODUODENOSCOPY ENDOSCOPY    . IR IMAGING GUIDED PORT INSERTION  04/04/2018    There were no vitals filed for this visit.    Subjective Assessment - 03/20/20 1408    Subjective My neuropathy bothers me mostly at night. My fingers and feet are tingly and I have sharp pains that shoot up in my legs. It has gotten worse. I am not doing chemo now.    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 Yes    Pain Score 9     Pain Location Foot    Pain Orientation Right    Pain Descriptors / Indicators Pins and needles    Pain Type Neuropathic pain    Pain Onset More than a month ago    Pain Frequency Constant    Aggravating Factors  nothing    Pain Relieving Factors sit for a while and holding it  up    Effect of Pain on Daily Activities hard to walk, difficult to balance              [facility] PT Assessment - 03/20/20 0001      Assessment   Medical Diagnosis CIPN- metastatic breast cancer    Referring Provider (PT) Annamaria Boots    Onset Date/Surgical Date 03/16/18    Hand Dominance Right    Prior Therapy eval and 1 f/u in 2020 for CIPN      Precautions   Precautions Other (comment)    Precaution Comments at risk for lymphedema      Restrictions   Weight Bearing Restrictions No      Balance Screen   Has the patient fallen in the past 6 months No    Has the patient had a decrease in activity level because of a fear of falling?  No    Is the patient reluctant to leave their home because of a fear of falling?  No      Home Environment   Living Environment Private residence    Living Arrangements Alone    Available Help at Discharge Family    Type of [place]  Home Access Level entry    Home Layout Two level    Alternate Level Stairs-Number of Steps 14    Alternate Level Stairs-Rails Left      Prior Function   Level of Independence Independent    Vocation On disability    Leisure pt does not currently exercise      Cognition   Overall Cognitive Status Within Functional Limits for tasks assessed      Sensation   Light Touch Appears Intact   slightly harder to locate on R vs L     Functional Tests   Functional tests Sit to Stand;Single leg stance      Single Leg Stance   Comments 3 sec on LLE, 3 sec on RLE      Sit to Stand   Comments 30 sec sit to stand: 9 reps      Posture/Postural Control   Posture/Postural Control Postural limitations    Postural Limitations Rounded Shoulders;Forward head      Strength   Right Hip Flexion 3/5    Right Hip Extension 3-/5    Right Hip ABduction 3+/5    Left Hip Flexion 3/5    Left Hip Extension 2+/5    Left Hip ABduction 3+/5    Right Knee Flexion 4/5    Right Knee Extension 3+/5    Left Knee Flexion 3+/5    Left  Knee Extension 3/5    Right Ankle Dorsiflexion 3+/5    Left Ankle Dorsiflexion 3/5                      Outpatient [facility]13/[facility]  Lymphedema Life Impact Scale Total Score 26.47 %      Objective measurements completed on examination: See above findings.               PT Education - 03/20/20 1456    Education Details Educated pt that massage, ice and heat can all help neuropathy and she can try these at home. That PT will not cure neuropahty but working on balance and strengthening can improve pain. Issued article about CIPN.    Person(s) Educated Patient    Methods Explanation;Handout    Comprehension Verbalized understanding            PT Short Term Goals - 04/13/19 3419      PT SHORT TERM GOAL #1   Title Pt will perform scenario 2 of the MCTSIB standing with eyes closed/feet together for 30 seconds without corret hip/ankle strategies to avoid stepping strategy within 4 weeks.    Baseline Pt was only able to complete scenario 1 standing in rhomber eyes open for 30 secodns and SBA.    Time 4    Period Weeks    Status New    Target Date 05/18/19             PT Long Term Goals - 03/20/20 1458      PT LONG TERM GOAL #1   Title Pt will be able to perform single limb stance on either LE for 15 sec to decrease fall risk.    Baseline 3 sec on R and L    Time 6    Period Weeks    Status New    Target Date 05/01/20      PT LONG TERM GOAL #2   Title Pt will be able to complete 14 sit to stands in 30 sec to decrease fall risk  Baseline 9    Time 6    Period Weeks    Status New    Target Date 05/01/20      PT LONG TERM GOAL #3   Title Pt will demonstrate 4/5 bilateral hip flexor strength to decrease fall risk    Baseline 3/10 on R and L    Time 6    Period Weeks    Status New    Target Date 05/01/20      PT LONG TERM GOAL #4   Title pt will demosntrate 4/5 bilateral glute strength to  decrease fall risk and improve functional mobility    Baseline R 3-/5 L 2+/5    Time 6    Period Weeks    Status New    Target Date 05/01/20      PT LONG TERM GOAL #5   Title Pt will be independent in a home exercise program for continued strengthening and stretching.    Time 6    Period Weeks    Status New    Target Date 05/01/20                  Plan - 03/20/20 1451    Clinical Impression Statement Pt presents to PT with worsening neuropathy in her hands and feet. She was seen for an eval and 1 follow up visit at this clinic last year but had to stop coming. Pt returns and is able to come to appointments with arranged transportation. Manual muscle testing revealed weakness throughout LEs from hip to ankle. Pt's 30 sec sit to stand was poor for her age and she was only able to do single limb stance for 3 sec on R and L LEs. Pt would benefit from skilled PT services to help decrease pain from neuropathy, improve LE strength and balance.    Personal Factors and Comorbidities Time since onset of injury/illness/exacerbation;Transportation    Examination-Activity Limitations Sleep;Locomotion Level;Stand    Examination-Participation Restrictions Community Activity;Cleaning    Stability/Clinical Decision Making Stable/Uncomplicated    Clinical Decision Making Low    Rehab Potential Good    PT Frequency 2x / week    PT Duration 6 weeks    PT Treatment/Interventions Gait training;Stair training;Functional mobility training;Therapeutic activities;Therapeutic exercise;Balance training;Neuromuscular re-education;Patient/family education;Manual techniques;Manual lymph drainage;Passive range of motion;Taping;Vasopneumatic Device;ADLs/Self Care Home Management    PT Next Visit Plan see if pt tried massage, ice and heat at home, begin balance in //bars, over LE strengthening, NuStep    PT Home Exercise Plan try massage and heat for neuropathy    Consulted and Agree with Plan of Care Patient            Patient will benefit from skilled therapeutic intervention in order to improve the following deficits and impairments:  Difficulty walking, Decreased balance, Impaired sensation, Decreased activity tolerance, Postural dysfunction, Pain  Visit Diagnosis: Muscle weakness (generalized) - Plan: PT plan of care cert/re-cert  Other disturbances of skin sensation - Plan: PT plan of care cert/re-cert  Difficulty in walking, not elsewhere classified - Plan: PT plan of care cert/re-cert     Problem List Patient Active Problem List   Diagnosis Date Noted  . Cough, persistent 11/24/2019  . Shortness of breath 11/23/2019  . Gastroesophageal reflux disease 10/16/2019  . Health maintenance examination 10/16/2019  . Dysphagia 10/12/2019  . 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 Anemia due to chemotherapy 08/03/2018  . Port-A-Cath in place 04/21/2018  . Metastatic breast cancer ([place]) 03/30/2018  .  Goals of care, counseling/discussion 03/30/2018  . Liver masses 03/16/2018    Allyson Sabal [facility] 03/20/2020, 4:01 PM  [place], [place], [address] Phone: [phone]   Fax:  [phone]  Name: ROANNA REAVES MRN: [id number] Date of Birth: [birth year]   Manus Gunning, PT 03/20/20 4:01 PM

## 2020-03-25 ENCOUNTER — Other Ambulatory Visit: Payer: Self-pay | Admitting: Hematology

## 2020-03-25 DIAGNOSIS — C50919 Malignant neoplasm of unspecified site of unspecified female breast: Secondary | ICD-10-CM

## 2020-03-26 ENCOUNTER — Inpatient Hospital Stay: Payer: Medicaid Other | Attending: Hematology

## 2020-03-26 ENCOUNTER — Inpatient Hospital Stay: Payer: Medicaid Other

## 2020-03-26 ENCOUNTER — Other Ambulatory Visit: Payer: Self-pay

## 2020-03-26 VITALS — BP 153/88 | HR 67 | Temp 98.0°F | Resp 18

## 2020-03-26 DIAGNOSIS — Z7189 Other specified counseling: Secondary | ICD-10-CM

## 2020-03-26 DIAGNOSIS — C50919 Malignant neoplasm of unspecified site of unspecified female breast: Secondary | ICD-10-CM

## 2020-03-26 DIAGNOSIS — I700 Atherosclerosis of aorta: Secondary | ICD-10-CM | POA: Insufficient documentation

## 2020-03-26 DIAGNOSIS — R918 Other nonspecific abnormal finding of lung field: Secondary | ICD-10-CM | POA: Insufficient documentation

## 2020-03-26 DIAGNOSIS — I6782 Cerebral ischemia: Secondary | ICD-10-CM | POA: Diagnosis not present

## 2020-03-26 DIAGNOSIS — Z23 Encounter for immunization: Secondary | ICD-10-CM | POA: Diagnosis not present

## 2020-03-26 DIAGNOSIS — Z5111 Encounter for antineoplastic chemotherapy: Secondary | ICD-10-CM | POA: Insufficient documentation

## 2020-03-26 DIAGNOSIS — K449 Diaphragmatic hernia without obstruction or gangrene: Secondary | ICD-10-CM | POA: Diagnosis not present

## 2020-03-26 DIAGNOSIS — R197 Diarrhea, unspecified: Secondary | ICD-10-CM | POA: Insufficient documentation

## 2020-03-26 DIAGNOSIS — T451X5A Adverse effect of antineoplastic and immunosuppressive drugs, initial encounter: Secondary | ICD-10-CM | POA: Diagnosis not present

## 2020-03-26 DIAGNOSIS — J320 Chronic maxillary sinusitis: Secondary | ICD-10-CM | POA: Insufficient documentation

## 2020-03-26 DIAGNOSIS — C787 Secondary malignant neoplasm of liver and intrahepatic bile duct: Secondary | ICD-10-CM | POA: Diagnosis not present

## 2020-03-26 DIAGNOSIS — Z170 Estrogen receptor positive status [ER+]: Secondary | ICD-10-CM | POA: Diagnosis not present

## 2020-03-26 DIAGNOSIS — Z79899 Other long term (current) drug therapy: Secondary | ICD-10-CM | POA: Insufficient documentation

## 2020-03-26 DIAGNOSIS — D3501 Benign neoplasm of right adrenal gland: Secondary | ICD-10-CM | POA: Insufficient documentation

## 2020-03-26 DIAGNOSIS — R202 Paresthesia of skin: Secondary | ICD-10-CM | POA: Diagnosis not present

## 2020-03-26 DIAGNOSIS — Z95828 Presence of other vascular implants and grafts: Secondary | ICD-10-CM

## 2020-03-26 DIAGNOSIS — K2100 Gastro-esophageal reflux disease with esophagitis, without bleeding: Secondary | ICD-10-CM | POA: Insufficient documentation

## 2020-03-26 DIAGNOSIS — Z79811 Long term (current) use of aromatase inhibitors: Secondary | ICD-10-CM | POA: Insufficient documentation

## 2020-03-26 DIAGNOSIS — N270 Small kidney, unilateral: Secondary | ICD-10-CM | POA: Insufficient documentation

## 2020-03-26 DIAGNOSIS — C773 Secondary and unspecified malignant neoplasm of axilla and upper limb lymph nodes: Secondary | ICD-10-CM | POA: Insufficient documentation

## 2020-03-26 DIAGNOSIS — M79671 Pain in right foot: Secondary | ICD-10-CM | POA: Insufficient documentation

## 2020-03-26 DIAGNOSIS — Z7901 Long term (current) use of anticoagulants: Secondary | ICD-10-CM | POA: Diagnosis not present

## 2020-03-26 DIAGNOSIS — R062 Wheezing: Secondary | ICD-10-CM | POA: Diagnosis not present

## 2020-03-26 DIAGNOSIS — G620 Drug-induced polyneuropathy: Secondary | ICD-10-CM | POA: Diagnosis not present

## 2020-03-26 DIAGNOSIS — R0982 Postnasal drip: Secondary | ICD-10-CM | POA: Diagnosis not present

## 2020-03-26 DIAGNOSIS — D6481 Anemia due to antineoplastic chemotherapy: Secondary | ICD-10-CM | POA: Diagnosis not present

## 2020-03-26 DIAGNOSIS — C50811 Malignant neoplasm of overlapping sites of right female breast: Secondary | ICD-10-CM | POA: Insufficient documentation

## 2020-03-26 LAB — CBC WITH DIFFERENTIAL (CANCER CENTER ONLY)
Abs Immature Granulocytes: 0.02 10*3/uL (ref 0.00–0.07)
Basophils Absolute: 0.1 10*3/uL (ref 0.0–0.1)
Basophils Relative: 1 %
Eosinophils Absolute: 0.4 10*3/uL (ref 0.0–0.5)
Eosinophils Relative: 7 %
HCT: 32.6 % — ABNORMAL LOW (ref 36.0–46.0)
Hemoglobin: 10.6 g/dL — ABNORMAL LOW (ref 12.0–15.0)
Immature Granulocytes: 0 %
Lymphocytes Relative: 27 %
Lymphs Abs: 1.6 10*3/uL (ref 0.7–4.0)
MCH: 29 pg (ref 26.0–34.0)
MCHC: 32.5 g/dL (ref 30.0–36.0)
MCV: 89.3 fL (ref 80.0–100.0)
Monocytes Absolute: 0.3 10*3/uL (ref 0.1–1.0)
Monocytes Relative: 5 %
Neutro Abs: 3.5 10*3/uL (ref 1.7–7.7)
Neutrophils Relative %: 60 %
Platelet Count: 178 10*3/uL (ref 150–400)
RBC: 3.65 MIL/uL — ABNORMAL LOW (ref 3.87–5.11)
RDW: 13.8 % (ref 11.5–15.5)
WBC Count: 5.9 10*3/uL (ref 4.0–10.5)
nRBC: 0 % (ref 0.0–0.2)

## 2020-03-26 LAB — CMP (CANCER CENTER ONLY)
ALT: 21 U/L (ref 0–44)
AST: 24 U/L (ref 15–41)
Albumin: 3.7 g/dL (ref 3.5–5.0)
Alkaline Phosphatase: 123 U/L (ref 38–126)
Anion gap: 9 (ref 5–15)
BUN: 13 mg/dL (ref 8–23)
CO2: 24 mmol/L (ref 22–32)
Calcium: 9.7 mg/dL (ref 8.9–10.3)
Chloride: 104 mmol/L (ref 98–111)
Creatinine: 1.31 mg/dL — ABNORMAL HIGH (ref 0.44–1.00)
GFR, Est AFR Am: 50 mL/min — ABNORMAL LOW (ref >60)
GFR, Estimated: 43 mL/min — ABNORMAL LOW (ref >60)
Glucose, Bld: 101 mg/dL — ABNORMAL HIGH (ref 70–99)
Potassium: 4.1 mmol/L (ref 3.5–5.1)
Sodium: 137 mmol/L (ref 135–145)
Total Bilirubin: 0.3 mg/dL (ref 0.3–1.2)
Total Protein: 7 g/dL (ref 6.5–8.1)

## 2020-03-26 MED ORDER — HEPARIN SOD (PORK) LOCK FLUSH 100 UNIT/ML IV SOLN
500.0000 [IU] | Freq: Once | INTRAVENOUS | Status: AC | PRN
  Administered 2020-03-26: 500 [IU]
  Filled 2020-03-26: qty 5

## 2020-03-26 MED ORDER — DIPHENHYDRAMINE HCL 25 MG PO CAPS
ORAL_CAPSULE | ORAL | Status: AC
  Filled 2020-03-26: qty 1

## 2020-03-26 MED ORDER — SODIUM CHLORIDE 0.9% FLUSH
10.0000 mL | INTRAVENOUS | Status: DC | PRN
  Administered 2020-03-26: 10 mL
  Filled 2020-03-26: qty 10

## 2020-03-26 MED ORDER — ACETAMINOPHEN 325 MG PO TABS
650.0000 mg | ORAL_TABLET | Freq: Once | ORAL | Status: AC
  Administered 2020-03-26: 650 mg via ORAL

## 2020-03-26 MED ORDER — PERTUZ-TRASTUZ-HYALURON-ZZXF 60-60-2000 MG-MG-U/ML CHEMO ~~LOC~~ SOLN
10.0000 mL | Freq: Once | SUBCUTANEOUS | Status: AC
  Administered 2020-03-26: 10 mL via SUBCUTANEOUS
  Filled 2020-03-26: qty 10

## 2020-03-26 MED ORDER — DIPHENHYDRAMINE HCL 25 MG PO CAPS
25.0000 mg | ORAL_CAPSULE | Freq: Once | ORAL | Status: AC
  Administered 2020-03-26: 25 mg via ORAL

## 2020-03-26 MED ORDER — SODIUM CHLORIDE 0.9 % IV SOLN
Freq: Once | INTRAVENOUS | Status: DC
  Filled 2020-03-26: qty 250

## 2020-03-26 MED ORDER — ACETAMINOPHEN 325 MG PO TABS
ORAL_TABLET | ORAL | Status: AC
  Filled 2020-03-26: qty 2

## 2020-03-26 NOTE — Patient Instructions (Signed)
[facility] Discharge Instructions for Patients Receiving Chemotherapy  Today you received the following chemotherapy agents: [place]  To help prevent nausea and vomiting after your treatment, we encourage you to take your nausea medication as directed   If you develop nausea and vomiting that is not controlled by your nausea medication, call the clinic.   BELOW ARE SYMPTOMS THAT SHOULD BE REPORTED IMMEDIATELY:  *FEVER GREATER THAN 100.5 F  *CHILLS WITH OR WITHOUT FEVER  NAUSEA AND VOMITING THAT IS NOT CONTROLLED WITH YOUR NAUSEA MEDICATION  *UNUSUAL SHORTNESS OF BREATH  *UNUSUAL BRUISING OR BLEEDING  TENDERNESS IN MOUTH AND THROAT WITH OR WITHOUT PRESENCE OF ULCERS  *URINARY PROBLEMS  *BOWEL PROBLEMS  UNUSUAL RASH Items with * indicate a potential emergency and should be followed up as soon as possible.  Feel free to call the clinic should you have any questions or concerns. The clinic phone number is [phone].  Please show the CHEMO ALERT CARD at check-in to the Emergency Department and triage nurse.   

## 2020-03-27 LAB — CANCER ANTIGEN 27.29: CA 27.29: 71.2 U/mL — ABNORMAL HIGH (ref 0.0–38.6)

## 2020-03-28 ENCOUNTER — Other Ambulatory Visit: Payer: Self-pay | Admitting: Hematology

## 2020-03-28 MED ORDER — TRAMADOL HCL 50 MG PO TABS
50.0000 mg | ORAL_TABLET | Freq: Four times a day (QID) | ORAL | 0 refills | Status: DC | PRN
Start: 2020-03-28 — End: 2020-05-28

## 2020-03-28 MED FILL — DULoxetine HCL 20 MG CPEP: 20 | 30 days supply | Qty: 30 | Fill #0

## 2020-03-28 MED FILL — traMADol HCL 50 MG TABS: 50 | 7 days supply | Qty: 30 | Fill #0

## 2020-03-28 NOTE — Telephone Encounter (Signed)
I spoke with Ms Teresa Hays she states she is taking Cymalta 20mg  daily, not 40 mg daily.

## 2020-03-30 MED FILL — AMLODIPINE BESYLATE 5 MG TA: 5 | 30 days supply | Qty: 30 | Fill #2

## 2020-03-31 ENCOUNTER — Other Ambulatory Visit (HOSPITAL_COMMUNITY): Payer: Self-pay

## 2020-03-31 ENCOUNTER — Other Ambulatory Visit: Payer: Self-pay | Admitting: Hematology

## 2020-03-31 MED ORDER — LOSARTAN POTASSIUM 50 MG PO TABS: 50.0000 mg | ORAL_TABLET | Freq: Every day | ORAL | 3 refills | Status: DC

## 2020-03-31 MED FILL — LIDOCAINE-PRILOCAINE CREAM: 2.5-2.5 | 30 days supply | Qty: 30 | Fill #1

## 2020-03-31 MED FILL — LOSARTAN POTASSIUM 50 MG TA: 50 | 90 days supply | Qty: 90 | Fill #0

## 2020-04-02 ENCOUNTER — Other Ambulatory Visit: Payer: Self-pay

## 2020-04-02 ENCOUNTER — Encounter: Payer: Self-pay | Admitting: Primary Care

## 2020-04-02 ENCOUNTER — Ambulatory Visit (INDEPENDENT_AMBULATORY_CARE_PROVIDER_SITE_OTHER): Payer: Medicaid Other | Admitting: Primary Care

## 2020-04-02 DIAGNOSIS — J45909 Unspecified asthma, uncomplicated: Secondary | ICD-10-CM | POA: Diagnosis not present

## 2020-04-02 MED ORDER — MONTELUKAST SODIUM 10 MG PO TABS: 10.0000 mg | ORAL_TABLET | Freq: Every day | ORAL | 1 refills | Status: DC

## 2020-04-02 MED FILL — MONTELUKAST SOD 10 MG TAB: 10 | 30 days supply | Qty: 30 | Fill #0

## 2020-04-02 MED FILL — POTASSIUM CHLORIDE CRYS ER: 20 | 30 days supply | Qty: 30 | Fill #0

## 2020-04-02 NOTE — Progress Notes (Signed)
@Patient  ID: Teresa Hays, female    DOB: 1956/03/21, 64 y.o.   MRN: [id number]  Chief Complaint  Patient presents with  . Follow-up    Pt states she has been okay since last visit. States as soon as she finishes prednisone she begins to wheeze again. Pt states she feels like the Symbicort 80 has been helping some to treat her symptoms.    Referring provider: [name], DO  HPI: 64 year old female, never smoked.  Past medical history significant for metastatic breast cancer, allergic rhinitis, anemia GERD, dysphagia, peripheral neuropathy due to chemotherapy, persistent cough.  Patient of Dr. Valeta Harms, last seen by pulmonary nurse practitioner on 01/29/2020.  Maintained on Breztri.  Previous [facility] pulmonary encounter: 01/29/2020  - NP, Warner Mccreedy  64 year old female never smoker followed in our office for chronic cough.  Patient is also followed by Dr. Burr Medico with oncology for metastatic breast cancer.  Patient was last seen in our office in May/2021 for wheezing as well as cough and congestion.  Patient was treated with a prednisone taper as well as given a therapeutic trial of ICS/LABA/LAMA.  Patient responded well and breathing returned back to baseline.  At patient's request we tried to transition the patient off of inhalers.  She was transitioned off of ICS/LABA/LAMA agent about 4 weeks ago.  She reports that she started to have worsened wheezing, shortness of breath with exertion over the last week.  Patient was supposed to have pulmonary function testing which was ordered in May/2021 but these have not been completed yet.  We will review this today.  Patient having upcoming appointment with gastroenterology for evaluation of GERD, cough at oncology's request.   04/02/2020- Interim hx Patient presents today for 2 month follow-up. Called our office with reports chest congestion, cough with light green sputum and wheezing.  She was sent in additional RX for prednisone. States that  wheezing will go away completely when on steroids. She is currently on Symbicort 160 two puffs twice daily. On average she forgets to take second dose of Symbicort twice a week. She holds her breath while taking medication, she can sometimes see medication come back out of her mouth.  She finished prednisone course. Not currently wheezing much. She has a slight productive cough and PND symptoms with clear-yellow mucus. She is taking Priolsec 20mg . She has no PFTs in file. Eosinophils in September 2021 were elevated at 400.   No Known Allergies  Immunization History  Administered Date(s) Administered  . Influenza,inj,Quad PF,6+ Mos 04/28/2018, 04/06/2019  . PFIZER SARS-COV-2 Vaccination 09/30/2019, 10/20/2019  . Tdap 11/23/2019    Past Medical History:  Diagnosis Date  . GERD (gastroesophageal reflux disease)   . Hypertension   . Personal history of chemotherapy   . rt breast ca with mets to liver dx'd 03/2018    Tobacco History: Social History   Tobacco Use  Smoking Status Never Smoker  Smokeless Tobacco Never Used   Counseling given: Not Answered   Outpatient Medications Prior to Visit  Medication Sig Dispense Refill  . albuterol (VENTOLIN HFA) 108 (90 Base) MCG/ACT inhaler Inhale 1-2 puffs into the lungs every 6 (six) hours as needed for wheezing or shortness of breath.    Marland Kitchen amLODipine (NORVASC) 5 MG tablet TAKE 1 TABLET (5 MG TOTAL) BY MOUTH DAILY. 30 tablet 2  . budesonide-formoterol (SYMBICORT) 160-4.5 MCG/ACT inhaler Inhale 2 puffs into the lungs 2 (two) times daily. Rinse mouth after each use. 1 Inhaler 6  . carvedilol (COREG) 3.125  MG tablet TAKE 1 TABLET BY MOUTH TWICE DAILY. REPLACES ATENOLOL 180 tablet 3  . diphenoxylate-atropine (LOMOTIL) 2.5-0.025 MG tablet Take 1-2 tablets by mouth 4 (four) times daily as needed for diarrhea or loose stools. 60 tablet 1  . DULoxetine (CYMBALTA) 20 MG capsule TAKE 1 CAPSULE BY MOUTH DAILY 30 capsule 2  . gabapentin (NEURONTIN) 300 MG  capsule May take up to 3 capsules three times daily 270 capsule 3  . letrozole (FEMARA) 2.5 MG tablet TAKE 1 TABLET BY MOUTH ONCE DAILY 90 tablet 1  . lidocaine-prilocaine (EMLA) cream Apply to affected area once 30 g 3  . losartan (COZAAR) 50 MG tablet Take 1 tablet (50 mg total) by mouth daily. 90 tablet 3  . omeprazole (PRILOSEC) 40 MG capsule Take 1 capsule (40 mg total) by mouth 2 (two) times daily. 60 capsule 3  . potassium chloride SA (KLOR-CON) 20 MEQ tablet TAKE 1 TABLET BY MOUTH ONCE DAILY 30 tablet 0  . rivaroxaban (XARELTO) 20 MG TABS tablet TAKE 1 TABLET BY MOUTH DAILY WITH SUPPER 30 tablet 5  . spironolactone (ALDACTONE) 25 MG tablet TAKE 1 TABLET BY MOUTH ONCE DAILY 90 tablet 3  . traMADol (ULTRAM) 50 MG tablet Take 1 tablet (50 mg total) by mouth every 6 (six) hours as needed for moderate pain or severe pain. 30 tablet 0  . predniSONE (DELTASONE) 10 MG tablet Take 4 tabs po daily x 3 days; then 3 tabs daily x3 days; then 2 tabs daily x3 days; then 1 tab daily x 3 days; then stop 30 tablet 0   No facility-administered medications prior to visit.    Review of Systems  Review of Systems  Constitutional: Negative.   HENT: Positive for congestion.   Respiratory: Negative for cough, shortness of breath and wheezing.      Physical Exam  BP 118/70 (BP Location: Left Arm, Cuff Size: Normal)   Pulse 66   Temp 99 F (37.2 C) (Other (Comment)) Comment (Src): wrist  Ht 5\' 4"  (1.626 m)   Wt 145 lb 6.4 oz (66 kg)   SpO2 97%   BMI 24.96 kg/m  Physical Exam Constitutional:      Appearance: Normal appearance.  HENT:     Head: Normocephalic and atraumatic.     Mouth/Throat:     Mouth: Mucous membranes are moist.     Pharynx: Oropharynx is clear.  Cardiovascular:     Rate and Rhythm: Normal rate and regular rhythm.  Pulmonary:     Effort: Pulmonary effort is normal.     Breath sounds: Normal breath sounds. No wheezing.  Abdominal:     General: Abdomen is flat.      Palpations: Abdomen is soft.  Musculoskeletal:        General: Normal range of motion.     Cervical back: Normal range of motion and neck supple.  Skin:    General: Skin is warm and dry.  Neurological:     General: No focal deficit present.     Mental Status: She is alert and oriented to person, place, and time. Mental status is at baseline.  Psychiatric:        Mood and Affect: Mood normal.        Thought Content: Thought content normal.        Judgment: Judgment normal.      Lab Results:  CBC    Component Value Date/Time   WBC 5.9 03/26/2020 0838   WBC 3.2 (L) 04/25/2018 2030  RBC 3.65 (L) 03/26/2020 0838   HGB 10.6 (L) 03/26/2020 0838   HCT 32.6 (L) 03/26/2020 0838   HCT 34.2 01/04/2020 0809   PLT 178 03/26/2020 0838   MCV 89.3 03/26/2020 0838   MCH 29.0 03/26/2020 0838   MCHC 32.5 03/26/2020 0838   RDW 13.8 03/26/2020 0838   LYMPHSABS 1.6 03/26/2020 0838   MONOABS 0.3 03/26/2020 0838   EOSABS 0.4 03/26/2020 0838   BASOSABS 0.1 03/26/2020 0838    BMET    Component Value Date/Time   NA 137 03/26/2020 0838   K 4.1 03/26/2020 0838   CL 104 03/26/2020 0838   CO2 24 03/26/2020 0838   GLUCOSE 101 (H) 03/26/2020 0838   BUN 13 03/26/2020 0838   CREATININE 1.31 (H) 03/26/2020 0838   CALCIUM 9.7 03/26/2020 0838   GFRNONAA 43 (L) 03/26/2020 0838   GFRAA 50 (L) 03/26/2020 0838    BNP    Component Value Date/Time   BNP 9.3 05/29/2019 1016    ProBNP No results found for: PROBNP  Imaging: No results found.   Assessment & Plan:   Asthma - No acute exacerbation symptoms today. She has an occasional productive cough with some associated wheezing which improves with oral steroids. Currently maintained on Symbicort 160 two puffs twice daily. She forgets to take her second dose on average twice a week. She is also not properly using HFA inhaler. Recommend adding spacer with Symbicort.  - Adding Singulair 10mg  at bedtime every night for asthma/allergies (discussed  potential side effects from medication) and instructed her take mucinex 600 twice a day for 1 week - No PFTs on file, she needs pulmonary function testing in 3 months   - FU in 3 months with Dr. Valeta Harms or if symptoms worsen     Martyn Ehrich, NP 04/02/2020

## 2020-04-02 NOTE — Assessment & Plan Note (Signed)
-   No acute exacerbation symptoms today. She has an occasional productive cough with some associated wheezing which improves with oral steroids. Currently maintained on Symbicort 160 two puffs twice daily. She forgets to take her second dose on average twice a week. She is also not properly using HFA inhaler. Recommend adding spacer with Symbicort.  - Adding Singulair 10mg  at bedtime every night for asthma/allergies (discussed potential side effects from medication) and instructed her take mucinex 600 twice a day for 1 week - No PFTs on file, she needs pulmonary function testing in 3 months   - FU in 3 months with Dr. Valeta Harms or if symptoms worsen

## 2020-04-02 NOTE — Patient Instructions (Addendum)
Orders: - PFTs in 3 months  - Aerochamber   Recommendations: - Use Spacer with Symbicort 160 two puffs twice daily (rinse mouth after use) - Adding medication called Singulair - take 10mg  at bedtime every night for asthma/allergies  - Take mucinex 600 twice a day for 1 week  Follow-up: - 3 months with Dr. Valeta Harms or if symptoms worsen    Montelukast oral tablets What is this medicine? MONTELUKAST (mon te LOO kast) is used to prevent and treat the symptoms of asthma. It is also used to treat allergies. Do not use for an acute asthma attack. This medicine may be used for other purposes; ask your health care provider or pharmacist if you have questions. COMMON BRAND NAME(S): Singulair What should I tell my health care provider before I take this medicine? They need to know if you have any of these conditions:  liver disease  an unusual or allergic reaction to montelukast, other medicines, foods, dyes, or preservatives  pregnant or trying to get pregnant  breast-feeding How should I use this medicine? This medicine should be given by mouth. Follow the directions on the prescription label. Take this medicine at the same time every day. You may take this medicine with or without meals. Do not chew the tablets. Do not stop taking your medicine unless your doctor tells you to. Talk to your pediatrician regarding the use of this medicine in children. Special care may be needed. While this drug may be prescribed for children as young as 8 years of age for selected conditions, precautions do apply. Overdosage: If you think you have taken too much of this medicine contact a poison control center or emergency room at once. NOTE: This medicine is only for you. Do not share this medicine with others. What if I miss a dose? If you miss a dose, skip it. Take your next dose at the normal time. Do not take extra or 2 doses at the same time to make up for the missed dose. What may interact with this  medicine?  anti-infectives like rifampin and rifabutin  medicines for seizures like phenytoin, phenobarbital, and carbamazepine This list may not describe all possible interactions. Give your health care provider a list of all the medicines, herbs, non-prescription drugs, or dietary supplements you use. Also tell them if you smoke, drink alcohol, or use illegal drugs. Some items may interact with your medicine. What should I watch for while using this medicine? Visit your doctor or health care professional for regular checks on your progress. Tell your doctor or health care professional if your allergy or asthma symptoms do not improve. Take your medicine even when you do not have symptoms. Do not stop taking any of your medicine(s) unless your doctor tells you to. If you have asthma, talk to your doctor about what to do in an acute asthma attack. Always have your inhaled rescue medicine for asthma attacks with you. Patients and their families should watch for new or worsening thoughts of suicide or depression. Also watch for sudden changes in feelings such as feeling anxious, agitated, panicky, irritable, hostile, aggressive, impulsive, severely restless, overly excited and hyperactive, or not being able to sleep. Any worsening of mood or thoughts of suicide or dying should be reported to your health care professional right away. What side effects may I notice from receiving this medicine? Side effects that you should report to your doctor or health care professional as soon as possible:  allergic reactions like skin rash or  hives, or swelling of the face, lips, or tongue  breathing problems  changes in emotions or moods  confusion  depressed mood  fever or infection  hallucinations  joint pain  painful lumps under the skin  pain, tingling, numbness in the hands or feet  redness, blistering, peeling, or loosening of the skin, including inside the  mouth  restlessness  seizures  sleep walking  signs and symptoms of infection like fever; chills; cough; sore throat; flu-like illness  signs and symptoms of liver injury like dark yellow or brown urine; general ill feeling or flu-like symptoms; light-colored stools; loss of appetite; nausea; right upper belly pain; unusually weak or tired; yellowing of the eyes or skin  sinus pain or swelling  stuttering  suicidal thoughts or other mood changes  tremors  trouble sleeping  uncontrolled muscle movements  unusual bleeding or bruising  vivid or bad dreams Side effects that usually do not require medical attention (report to your doctor or health care professional if they continue or are bothersome):  dizziness  drowsiness  headache  runny nose  stomach upset  tiredness This list may not describe all possible side effects. Call your doctor for medical advice about side effects. You may report side effects to FDA at 1-800-FDA-1088. Where should I keep my medicine? Keep out of the reach of children. Store at room temperature between 15 and 30 degrees C (59 and 86 degrees F). Protect from light and moisture. Keep this medicine in the original bottle. Throw away any unused medicine after the expiration date. NOTE: This sheet is a summary. It may not cover all possible information. If you have questions about this medicine, talk to your doctor, pharmacist, or health care provider.  2020 [employer]vier/Gold Standard (2018-11-03 12:54:33)

## 2020-04-04 ENCOUNTER — Encounter: Payer: Self-pay | Admitting: Hematology

## 2020-04-07 ENCOUNTER — Other Ambulatory Visit: Payer: Self-pay

## 2020-04-07 ENCOUNTER — Other Ambulatory Visit: Payer: Self-pay | Admitting: Nurse Practitioner

## 2020-04-07 DIAGNOSIS — K219 Gastro-esophageal reflux disease without esophagitis: Secondary | ICD-10-CM

## 2020-04-07 DIAGNOSIS — G620 Drug-induced polyneuropathy: Secondary | ICD-10-CM

## 2020-04-07 DIAGNOSIS — T451X5A Adverse effect of antineoplastic and immunosuppressive drugs, initial encounter: Secondary | ICD-10-CM

## 2020-04-07 DIAGNOSIS — C50919 Malignant neoplasm of unspecified site of unspecified female breast: Secondary | ICD-10-CM

## 2020-04-07 MED ORDER — DULOXETINE HCL 20 MG PO CPEP: 40.0000 mg | ORAL_CAPSULE | Freq: Every day | ORAL | 3 refills | Status: DC

## 2020-04-07 MED FILL — GABAPENTIN 300 MG CAPSULE: 300 | 30 days supply | Qty: 270 | Fill #0

## 2020-04-08 MED FILL — SYMBICORT 160-4.5 MCG INH: 160-4.5 | 30 days supply | Qty: 10 | Fill #2

## 2020-04-10 ENCOUNTER — Encounter: Payer: Medicaid Other | Admitting: Rehabilitation

## 2020-04-10 ENCOUNTER — Other Ambulatory Visit: Payer: Self-pay | Admitting: Nurse Practitioner

## 2020-04-10 MED FILL — OMEPRAZOLE 40 MG CPDR: 40 | 30 days supply | Qty: 60 | Fill #0

## 2020-04-14 MED FILL — XARELTO 20 MG TABLET: 20 | 30 days supply | Qty: 30 | Fill #5

## 2020-04-15 ENCOUNTER — Other Ambulatory Visit: Payer: Self-pay | Admitting: Hematology

## 2020-04-15 ENCOUNTER — Other Ambulatory Visit: Payer: Self-pay | Admitting: Nurse Practitioner

## 2020-04-15 ENCOUNTER — Encounter: Payer: Medicaid Other | Admitting: Physical Therapy

## 2020-04-15 DIAGNOSIS — G620 Drug-induced polyneuropathy: Secondary | ICD-10-CM

## 2020-04-15 DIAGNOSIS — T451X5A Adverse effect of antineoplastic and immunosuppressive drugs, initial encounter: Secondary | ICD-10-CM

## 2020-04-15 MED ORDER — GABAPENTIN 300 MG PO CAPS: ORAL_CAPSULE | ORAL | 3 refills | Status: DC

## 2020-04-16 ENCOUNTER — Inpatient Hospital Stay: Payer: Medicaid Other

## 2020-04-16 ENCOUNTER — Other Ambulatory Visit: Payer: Self-pay

## 2020-04-16 ENCOUNTER — Other Ambulatory Visit: Payer: Self-pay | Admitting: Hematology

## 2020-04-16 ENCOUNTER — Inpatient Hospital Stay (HOSPITAL_BASED_OUTPATIENT_CLINIC_OR_DEPARTMENT_OTHER): Payer: Medicaid Other | Admitting: Hematology

## 2020-04-16 ENCOUNTER — Encounter: Payer: Self-pay | Admitting: Hematology

## 2020-04-16 VITALS — BP 149/89 | HR 60 | Temp 97.5°F | Resp 18 | Ht 64.0 in | Wt 145.5 lb

## 2020-04-16 DIAGNOSIS — C50919 Malignant neoplasm of unspecified site of unspecified female breast: Secondary | ICD-10-CM

## 2020-04-16 DIAGNOSIS — Z7189 Other specified counseling: Secondary | ICD-10-CM

## 2020-04-16 DIAGNOSIS — Z5111 Encounter for antineoplastic chemotherapy: Secondary | ICD-10-CM | POA: Diagnosis not present

## 2020-04-16 DIAGNOSIS — Z95828 Presence of other vascular implants and grafts: Secondary | ICD-10-CM

## 2020-04-16 LAB — CBC WITH DIFFERENTIAL (CANCER CENTER ONLY)
Abs Immature Granulocytes: 0.01 10*3/uL (ref 0.00–0.07)
Basophils Absolute: 0.1 10*3/uL (ref 0.0–0.1)
Basophils Relative: 1 %
Eosinophils Absolute: 0.7 10*3/uL — ABNORMAL HIGH (ref 0.0–0.5)
Eosinophils Relative: 10 %
HCT: 32.1 % — ABNORMAL LOW (ref 36.0–46.0)
Hemoglobin: 10.4 g/dL — ABNORMAL LOW (ref 12.0–15.0)
Immature Granulocytes: 0 %
Lymphocytes Relative: 28 %
Lymphs Abs: 2.1 10*3/uL (ref 0.7–4.0)
MCH: 28.4 pg (ref 26.0–34.0)
MCHC: 32.4 g/dL (ref 30.0–36.0)
MCV: 87.7 fL (ref 80.0–100.0)
Monocytes Absolute: 0.4 10*3/uL (ref 0.1–1.0)
Monocytes Relative: 5 %
Neutro Abs: 4.3 10*3/uL (ref 1.7–7.7)
Neutrophils Relative %: 56 %
Platelet Count: 208 10*3/uL (ref 150–400)
RBC: 3.66 MIL/uL — ABNORMAL LOW (ref 3.87–5.11)
RDW: 14.2 % (ref 11.5–15.5)
WBC Count: 7.6 10*3/uL (ref 4.0–10.5)
nRBC: 0 % (ref 0.0–0.2)

## 2020-04-16 LAB — CMP (CANCER CENTER ONLY)
ALT: 16 U/L (ref 0–44)
AST: 22 U/L (ref 15–41)
Albumin: 3.7 g/dL (ref 3.5–5.0)
Alkaline Phosphatase: 129 U/L — ABNORMAL HIGH (ref 38–126)
Anion gap: 5 (ref 5–15)
BUN: 14 mg/dL (ref 8–23)
CO2: 24 mmol/L (ref 22–32)
Calcium: 9.3 mg/dL (ref 8.9–10.3)
Chloride: 107 mmol/L (ref 98–111)
Creatinine: 1.24 mg/dL — ABNORMAL HIGH (ref 0.44–1.00)
GFR, Est AFR Am: 54 mL/min — ABNORMAL LOW (ref >60)
GFR, Estimated: 46 mL/min — ABNORMAL LOW (ref >60)
Glucose, Bld: 90 mg/dL (ref 70–99)
Potassium: 4.1 mmol/L (ref 3.5–5.1)
Sodium: 136 mmol/L (ref 135–145)
Total Bilirubin: 0.3 mg/dL (ref 0.3–1.2)
Total Protein: 7.2 g/dL (ref 6.5–8.1)

## 2020-04-16 MED ORDER — ACETAMINOPHEN 325 MG PO TABS
650.0000 mg | ORAL_TABLET | Freq: Once | ORAL | Status: AC
  Administered 2020-04-16: 650 mg via ORAL

## 2020-04-16 MED ORDER — DIPHENHYDRAMINE HCL 25 MG PO CAPS
ORAL_CAPSULE | ORAL | Status: AC
  Filled 2020-04-16: qty 1

## 2020-04-16 MED ORDER — INFLUENZA VAC SPLIT QUAD 0.5 ML IM SUSY
0.5000 mL | PREFILLED_SYRINGE | Freq: Once | INTRAMUSCULAR | Status: AC
  Administered 2020-04-16: 0.5 mL via INTRAMUSCULAR

## 2020-04-16 MED ORDER — DIPHENOXYLATE-ATROPINE 2.5-0.025 MG PO TABS: 1.0000 | ORAL_TABLET | Freq: Four times a day (QID) | ORAL | 1 refills | Status: DC | PRN

## 2020-04-16 MED ORDER — SODIUM CHLORIDE 0.9% FLUSH
10.0000 mL | INTRAVENOUS | Status: DC | PRN
  Administered 2020-04-16: 10 mL
  Filled 2020-04-16: qty 10

## 2020-04-16 MED ORDER — ACETAMINOPHEN 325 MG PO TABS
ORAL_TABLET | ORAL | Status: AC
  Filled 2020-04-16: qty 2

## 2020-04-16 MED ORDER — HEPARIN SOD (PORK) LOCK FLUSH 100 UNIT/ML IV SOLN
500.0000 [IU] | Freq: Once | INTRAVENOUS | Status: AC | PRN
  Administered 2020-04-16: 500 [IU]
  Filled 2020-04-16: qty 5

## 2020-04-16 MED ORDER — PERTUZ-TRASTUZ-HYALURON-ZZXF 60-60-2000 MG-MG-U/ML CHEMO ~~LOC~~ SOLN
10.0000 mL | Freq: Once | SUBCUTANEOUS | Status: AC
  Administered 2020-04-16: 10 mL via SUBCUTANEOUS
  Filled 2020-04-16: qty 10

## 2020-04-16 MED ORDER — INFLUENZA VAC SPLIT QUAD 0.5 ML IM SUSY
PREFILLED_SYRINGE | INTRAMUSCULAR | Status: AC
  Filled 2020-04-16: qty 0.5

## 2020-04-16 MED ORDER — DIPHENHYDRAMINE HCL 25 MG PO CAPS
25.0000 mg | ORAL_CAPSULE | Freq: Once | ORAL | Status: AC
  Administered 2020-04-16: 25 mg via ORAL

## 2020-04-16 MED FILL — DIPHENOXYLATE-ATROPINE 2.5-: 2.5-0.025 | 7 days supply | Qty: 60 | Fill #0

## 2020-04-16 NOTE — Patient Instructions (Addendum)
[facility] Discharge Instructions for Patients Receiving Chemotherapy  Today you received the following chemotherapy agents Pertuzumab-trastuzumab-hyaluronase-zzxf ([place]).  To help prevent nausea and vomiting after your treatment, we encourage you to take your nausea medication as prescribed.   If you develop nausea and vomiting that is not controlled by your nausea medication, call the clinic.   BELOW ARE SYMPTOMS THAT SHOULD BE REPORTED IMMEDIATELY:  *FEVER GREATER THAN 100.5 F  *CHILLS WITH OR WITHOUT FEVER  NAUSEA AND VOMITING THAT IS NOT CONTROLLED WITH YOUR NAUSEA MEDICATION  *UNUSUAL SHORTNESS OF BREATH  *UNUSUAL BRUISING OR BLEEDING  TENDERNESS IN MOUTH AND THROAT WITH OR WITHOUT PRESENCE OF ULCERS  *URINARY PROBLEMS  *BOWEL PROBLEMS  UNUSUAL RASH Items with * indicate a potential emergency and should be followed up as soon as possible.  Feel free to call the clinic should you have any questions or concerns. The clinic phone number is [phone].  Please show the [facility] at check-in to the Emergency Department and triage nurse.

## 2020-04-16 NOTE — Patient Instructions (Signed)

## 2020-04-16 NOTE — Progress Notes (Signed)
Teresa Hays   [phone] [phone]   Clinic Follow up Note   Patient Care Team: [name], DO as PCP - General (Family Medicine) [name], MD as Consulting Physician (Gastroenterology) [name], MD as Consulting Physician (Hematology)  Date of Service:  04/16/2020  CHIEF COMPLAINT: F/u for metastatic breast cancer  SUMMARY OF ONCOLOGIC HISTORY: Oncology History Overview Note  Cancer Staging Metastatic breast cancer [facility]) Staging form: Breast, AJCC 8th Edition - Clinical stage from 03/24/2018: Stage IV (cT2, cN1, pM1, G3, ER+, PR+, HER2+) - Signed by [name], MD on 03/30/2018     Metastatic breast cancer ([place])  01/30/2018 Procedure   Colonoscopy showed small polyp in the sigmoid colon, removed, the exam of colon including the terminal ileum was otherwise negative.   01/30/2018 Procedure   EGD by Dr. Collene Mares showed small hiatal hernia, a 8 mm polypoid lesion in the cardia, biopsied.   03/09/2018 Imaging   03/09/2018 US Abdomen IMPRESSION: 1. Mass lesions throughout the liver, consistent with metastatic disease. Liver as a somewhat nodular contour suggesting underlying hepatic cirrhosis. Inhomogeneous echotexture to the liver.  2. Cholelithiasis with mild gallbladder wall thickening. A degree of cholecystitis cannot be excluded by ultrasound.  3. Portions of pancreas obscured by gas. Visualized portions of pancreas appear normal.  4. Small right kidney. Etiology uncertain. This finding potentially may be indicative of renal artery stenosis. In this regard, question whether patient is hypertensive.   03/15/2018 Imaging   CT CAP with contrast  IMPRESSION: 1. Widespread hepatic metastasis. 2. 2.6 cm lateral right breast soft tissue nodule could represent a breast primary or an incidental benign lesion. Consider correlation with mammogram and ultrasound. 3. No definite source of primary malignancy identified within the abdomen or  pelvis. There is possible rectosigmoid junction wall thickening. Consider colonoscopy with attention to this area. 4. Distal esophageal wall thickening, suggesting esophagitis.   03/24/2018 Cancer Staging   Staging form: Breast, AJCC 8th Edition - Clinical stage from 03/24/2018: Stage IV (cT2, cN1, pM1, G3, ER+, PR+, HER2+) - Signed by [name], MD on 03/30/2018   03/24/2018 Initial Biopsy   Diagnosis 1. 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 Breast, right, needle core biopsy, 9 o'clock, 7cm from nipple - INVASIVE DUCTAL CARCINOMA. -The carcinoma is somewhat morphologically dissimilar from that in part 1. It appears grade III 3. Lymph node, needle/core biopsy, right axillary - METASTATIC CARCINOMA IN 1 OF 1 LYMPH NODE (1/1).   03/24/2018 Receptors her2   Breast biopsy: 1. Estrogen Receptor: 40%, POSITIVE, STRONG-MODERATE STAINING INTENSITY Progesterone Receptor: 70%, POSITIVE, STRONG STAINING INTENSITY Proliferation Marker Ki67: 20% HER 2 equivocal by IHC 2+, POSITIVE by FISH, ratio 2.4 and copy #4.2  2. Estrogen Receptor: 60%, POSITIVE, MODERATE STAINING INTENSITY Progesterone Receptor: 40%, POSITIVE, MODERATE STAINING INTENSITY Proliferation Marker Ki67: 20% HER2 (+) by IHC 3+   03/24/2018 Initial Diagnosis   Metastatic breast cancer ([place])   03/27/2018 Pathology Results   Diagnosis Liver, needle/core biopsy, Right - METASTATIC CARCINOMA TO LIVER, CONSISTENT WITH PATIENTS CLINICAL HISTORY OF PRIMARY BREAST CARCINOMA.  ER 80%+ PR40%+ HER2- (by [facility], IHC 2+)  Ki67 50%    03/28/2018 Pathology Results   03/28/2018 Surgical Pathology Diagnosis 1. Breast, left, needle core biopsy, 9 o'clock - FIBROCYSTIC CHANGES. - THERE IS NO EVIDENCE OF MALIGNANCY. 2. Breast, left, needle core biopsy, 2 o'clock - FIBROADENOMA. - THERE IS NO EVIDENCE OF  MALIGNANCY. - SEE COMMENT.   03/29/2018 Imaging    03/29/2018 Bone Scan IMPRESSION: No scintigraphic evidence of osseous metastatic disease.   03/30/2018 Imaging   Bone scan  IMPRESSION: No scintigraphic evidence of osseous metastatic disease.    04/07/2018 -  Chemotherapy   First line chemo weekly Taxol and herceptin/Perjeta every 3 weeks starting 04/07/18. She developed infusion reaction to taxol and it was discontinued. Added Abraxane on C1D8, 2 weeks on/1 week off.  Abraxne stopped after 10/13/18 due to worsening Neuropathy. She has continued with maintenance Herceptin injection/Perjeta q3weeks. Herceptin changed to injection on 04/06/19. Both injections switched to combination Phesgo on 10/01/19.   06/06/2018 Imaging   CT CAP IMPRESSION: 1. Generally improved appearance, with reduced axillary adenopathy and reduced enhancing component of the hepatic masses, with some of the hepatic mass is moderately smaller than on the prior exam. Reduced size of the right lateral breast mass compared to the prior 03/15/2018 exam. 2. New mild interstitial accentuation in the lungs, significance uncertain. Part of this appearance may be due to lower lung volumes on today's exam. 3. Mild wall thickening in the descending colon and upper rectum suggesting low-grade colitis/inflammation. Prominent stool throughout the colon favors constipation. 4. Other imaging findings of potential clinical significance: Aortic Atherosclerosis (ICD10-I70.0). Mild cardiomegaly. Mild nodularity in the right lower lobe appears stable. Contracted and thick-walled gallbladder.    09/18/2018 Imaging   CT CAP W Contrast 09/18/18  IMPRESSION: 1. Liver metastases have decreased in size. 2. Right breast mass, mild right axillary lymphadenopathy and scattered tiny right pulmonary nodules are all stable. 3. New mild left supraclavicular and left subpectoral lymphadenopathy, can not exclude progression of metastatic nodal disease. 4. Moderate colorectal stool volume, which may  indicate constipation. 5.  Aortic Atherosclerosis (ICD10-I70.0).   10/2018 -  Anti-estrogen oral therapy   Letrozole 2.5 mg daily starting 10/2018   01/10/2019 Imaging   CT CAP W Contrast 01/10/19  IMPRESSION: 1. Continued improvement in the hepatic metastatic lesions which have reduced in size. 2. Stable mild left supraclavicular and subpectoral adenopathy. 3. Essentially stable small right lower lobe pulmonary nodule and separate small subpleural nodule along the right hemidiaphragm. Surveillance suggested. 4. Other imaging findings of potential clinical significance: Mild cardiomegaly. Mild circumferential distal esophageal wall thickening, the most common cause would be esophagitis. Airway thickening is present, suggesting bronchitis or reactive airways disease. Airway plugging in the lower lobes and in the right middle lobe. Stable small right adrenal adenoma.   05/15/2019 Imaging   CT CAP W Contrast 05/15/19  IMPRESSION: CT CHEST IMPRESSION   1. Similar appearance of borderline supraclavicular, axillary, and subpectoral adenopathy. 2. Improved and resolved right lower lobe pulmonary nodularity. 3. Esophageal air fluid level suggests dysmotility or gastroesophageal reflux.   CT ABDOMEN AND PELVIS IMPRESSION   1. Improved hepatic metastasis. 2. Small bowel mesenteric lymph nodes which are upper normal and mildly enlarged. Likely increased and similar as detailed above. Indeterminate. Recommend attention on follow-up. 3. Cholelithiasis. 4. Motion degradation throughout the lower chest and abdomen.   09/19/2019 Imaging   CT CAP W contrast  IMPRESSION: 1. Interval decrease in size of right axillary and subpectoral lymph nodes. There are no pathologically enlarged lymph nodes remaining in the chest, abdomen, or pelvis. No new lymphadenopathy. 2. No significant change in post treatment appearance of multiple low-attenuation liver lesions, in keeping with treated  metastases. 3. No evidence of new metastatic disease in the chest, abdomen, or pelvis. 4. Aortic Atherosclerosis (ICD10-I70.0).   11/16/2019  Imaging   IMRI Brain  MPRESSION: Minimal chronic microvascular ischemic changes. No acute intracranial process.   Active bilateral maxillary sinus disease with mild frontoethmoid mucosal thickening.   01/23/2020 Imaging   CT CAP W contrast  IMPRESSION: 1. Stable exam. No new or progressive interval findings. 2. Stable appearance of upper normal right axillary lymph nodes. Tiny subpectoral and left axillary nodes are unchanged. 3. Generally similar appearance of ill-defined hypoattenuating lesions in the liver compatible with treated metastases. 1 lesion in the central liver is minimally more conspicuous today, likely related to bolus timing and attention on follow-up recommended. No new suspicious liver lesion on today's study. 4. Stable 10 mm right adrenal nodule., indeterminate. Continued attention on follow-up imaging recommended. 5. Cholelithiasis. 6. Aortic Atherosclerosis (ICD10-I70.0).   02/11/2020 Breast MRI   IMPRESSION: 1. 7 millimeter focus of residual enhancement associated with the known malignancy in the 9 o'clock location of the RIGHT breast. 2. No significant enhancement in the known malignancy in the 11:30 o'clock location of the RIGHT breast. 3. Interval resolution of axillary adenopathy.      CURRENT THERAPY:  -Herceptin injection/Perjeta q3weeksmaintenance therapy.Herceptin changed to injection on9/18/20. Both injections switched to combination Phesgo on 10/01/19. -Letrozole 2.5 mg daily starting 10/2018  INTERVAL HISTORY:  Teresa Hays is here for a follow up and treatment. She presents to the clinic with her daughter.  She notes she has been able to gain weight. She notes she has been able to eat adequately. She is on Singulare and Symbicort for her asthma and wheezing. She denies abdominal issues and has  normal BMs. She will have diarrhea based on her diet. When she met with Dr Brantley Stage breast surgery was not recommended at that time given her liver mets.    REVIEW OF SYSTEMS:   Constitutional: Denies fevers, chills or abnormal weight loss Eyes: Denies blurriness of vision Ears, nose, mouth, throat, and face: Denies mucositis or sore throat Respiratory: Denies cough, dyspnea or wheezes Cardiovascular: Denies palpitation, chest discomfort or lower extremity swelling Gastrointestinal:  Denies nausea, heartburn (+) Occasional diarrhea  Skin: Denies abnormal skin rashes Lymphatics: Denies new lymphadenopathy or easy bruising Neurological:Denies numbness, tingling or new weaknesses Behavioral/Psych: Mood is stable, no new changes  All other systems were reviewed with the patient and are negative.  MEDICAL HISTORY:  Past Medical History:  Diagnosis Date  . GERD (gastroesophageal reflux disease)   . Hypertension   . Personal history of chemotherapy   . rt breast ca with mets to liver dx'd 03/2018    SURGICAL HISTORY: Past Surgical History:  Procedure Laterality Date  . BREAST BIOPSY Right 03/24/2018   CA x3  . BREAST BIOPSY Left 03/28/2018   neg  . BREAST BIOPSY Left 03/30/2018   neg  . COLONOSCOPY    . ESOPHAGOGASTRODUODENOSCOPY ENDOSCOPY    . IR IMAGING GUIDED PORT INSERTION  04/04/2018    I have reviewed the social history and family history with the patient and they are unchanged from previous note.  ALLERGIES:  has No Known Allergies.  MEDICATIONS:  Current Outpatient Medications  Medication Sig Dispense Refill  . albuterol (VENTOLIN HFA) 108 (90 Base) MCG/ACT inhaler Inhale 1-2 puffs into the lungs every 6 (six) hours as needed for wheezing or shortness of breath.    Marland Kitchen amLODipine (NORVASC) 5 MG tablet TAKE 1 TABLET (5 MG TOTAL) BY MOUTH DAILY. 30 tablet 2  . budesonide-formoterol (SYMBICORT) 160-4.5 MCG/ACT inhaler Inhale 2 puffs into the lungs 2 (two) times daily. Rinse  mouth after each use. 1 Inhaler 6  . carvedilol (COREG) 3.125 MG tablet TAKE 1 TABLET BY MOUTH TWICE DAILY. REPLACES ATENOLOL 180 tablet 3  . diphenoxylate-atropine (LOMOTIL) 2.5-0.025 MG tablet Take 1-2 tablets by mouth 4 (four) times daily as needed for diarrhea or loose stools. 60 tablet 1  . DULoxetine (CYMBALTA) 20 MG capsule Take 2 capsules (40 mg total) by mouth daily. 60 capsule 3  . gabapentin (NEURONTIN) 300 MG capsule May take up to 3 capsules three times daily 270 capsule 3  . letrozole (FEMARA) 2.5 MG tablet TAKE 1 TABLET BY MOUTH ONCE DAILY 90 tablet 1  . lidocaine-prilocaine (EMLA) cream Apply to affected area once 30 g 3  . losartan (COZAAR) 50 MG tablet Take 1 tablet (50 mg total) by mouth daily. 90 tablet 3  . montelukast (SINGULAIR) 10 MG tablet Take 1 tablet (10 mg total) by mouth at bedtime. 30 tablet 1  . omeprazole (PRILOSEC) 40 MG capsule TAKE 1 CAPSULE BY MOUTH TWICE DAILY 60 capsule 3  . potassium chloride SA (KLOR-CON) 20 MEQ tablet TAKE 1 TABLET BY MOUTH ONCE DAILY 30 tablet 0  . rivaroxaban (XARELTO) 20 MG TABS tablet TAKE 1 TABLET BY MOUTH DAILY WITH SUPPER 30 tablet 5  . spironolactone (ALDACTONE) 25 MG tablet TAKE 1 TABLET BY MOUTH ONCE DAILY 90 tablet 3  . traMADol (ULTRAM) 50 MG tablet Take 1 tablet (50 mg total) by mouth every 6 (six) hours as needed for moderate pain or severe pain. 30 tablet 0   No current facility-administered medications for this visit.   Facility-Administered Medications Ordered in Other Visits  Medication Dose Route Frequency Provider Last Rate Last Admin  . acetaminophen (TYLENOL) tablet 650 mg  650 mg Oral Once [name], MD      . diphenhydrAMINE (BENADRYL) capsule 25 mg  25 mg Oral Once [name], MD      . heparin lock flush 100 unit/mL  500 Units Intracatheter Once PRN [name], MD      . pertuz-trastuz-hyaluron-zzxf ([place]) [phone] MG-MG-U/10ML chemo SQ injection maintenance dose 10 mL  10 mL Subcutaneous Once [name],  MD      . sodium chloride flush (NS) 0.9 % injection 10 mL  10 mL Intracatheter PRN [name], MD        PHYSICAL EXAMINATION: ECOG PERFORMANCE STATUS: 1 - Symptomatic but completely ambulatory  Vitals:   04/16/20 0903  BP: (!) 149/89  Pulse: 60  Resp: 18  Temp: (!) 97.5 F (36.4 C)  SpO2: 100%   Filed Weights   04/16/20 0903  Weight: 145 lb 8 oz (66 kg)    Due to COVID19 we will limit examination to appearance. Patient had no complaints.  GENERAL:alert, no distress and comfortable SKIN: skin color normal, no rashes or significant lesions EYES: normal, Conjunctiva are pink and non-injected, sclera clear  NEURO: alert & oriented x 3 with fluent speech   LABORATORY DATA:  I have reviewed the data as listed CBC Latest Ref Rng & Units 04/16/2020 03/26/2020 03/05/2020  WBC 4.0 - 10.5 K/uL 7.6 5.9 7.9  Hemoglobin 12.0 - 15.0 g/dL 10.4(L) 10.6(L) 10.4(L)  Hematocrit 36 - 46 % 32.1(L) 32.6(L) 31.8(L)  Platelets 150 - 400 K/uL 208 178 217     CMP Latest Ref Rng & Units 04/16/2020 03/26/2020 03/05/2020  Glucose 70 - 99 mg/dL 90 101(H) 88  BUN 8 - 23 mg/dL $Remove'14 13 19  'TgQcDrl$ Creatinine 0.44 - 1.00 mg/dL 1.24(H) 1.31(H) 1.22(H)  Sodium  135 - 145 mmol/L 136 137 137  Potassium 3.5 - 5.1 mmol/L 4.1 4.1 3.6  Chloride 98 - 111 mmol/L 107 104 105  CO2 22 - 32 mmol/L 24 24 24   Calcium 8.9 - 10.3 mg/dL 9.3 9.7 10.1  Total Protein 6.5 - 8.1 g/dL 7.2 7.0 6.8  Total Bilirubin 0.3 - 1.2 mg/dL 0.3 0.3 0.3  Alkaline Phos 38 - 126 U/L 129(H) 123 117  AST 15 - 41 U/L 22 24 20   ALT 0 - 44 U/L 16 21 14       RADIOGRAPHIC STUDIES: I have personally reviewed the radiological images as listed and agreed with the findings in the report. No results found.   ASSESSMENT & PLAN:  SAGAN WURZEL is a 64 y.o. female with    1. Metastatic right breast cancer to liver, stage IV, ER+/PR+/HER2+, Liver mets ER+/PR+/HER2- -She was diagnosed in 03/2018.She presented with diffuse liver metastasis, tworight  breast massesand right axillary adenopathy.Breast mass biopsy showed ER PR positive, but 1 was HER-2 positive, the other one was HER-2 negative. Liver met was HER2-.  -Given the metastatic disease, her cancer is not curable but still treatable. Shehas been treated withLetrozole daily andFirst-line Herceptin/Perjetaevery 3 weeks and weekly Taxol. Due to infusionreaction, taxol was switched to Abraxane.We stoppedAbraxaneafter cycle 10 due to worsening neuropathyand continued with Herceptin/Perjetaas maintenance therapy. Herceptin changed to injection on9/18/20. Both injections switched to combination Phesgo on 10/01/19. -Given good response in breast, she previously discussed the option of right Lumpectomy and axillary LN dissection to reduce her tumor burden with Dr Brantley Stage. He did not recommend surgery at this time given her diffuse liver metastasis. She is agreeable to hold on surgery  -She continues to tolerate treatment well. Labs reviewed, stable and adequate to proceed with Phesgo injection today. Will continue every 3 weeks. -Continue Letrozole  -f/u in6weeks  -Next CT scan in Dec    2.Peripheral neuropathy, grade 2, Right Foot pain,Secondarytochemotherapy -Ipreviouslystropped Abraxane on 10/13/18. -Her main painwasinthe heal and ball of herright foot. She also has tingling in her hands.  -She has tried PT 2-3 times and received steroid injectionsto right foot from podiatrist. -I encouraged her to watch her balance to avoid falls.  -She will also continue OTC Calcium and Vit D,OralB12, Gabapentin 300mg 3 tabsTID andCymbalta to 40mg . Well controlled.   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 Anemia, Secondary to chemo -Continue monitoring, will consider blood transfusion if hemoglobin less than 8. -Mild and stable.  5Social support, Goal of Care Discussion -She lives alone, has her sister and niece in [place]. Her daughter lives  in a few hours away  -On Ativan PRN due to anxietyabout diagnosis and treatment -She is full code  6. Recurrentdyspnea and wheezing,Acid Reflux,Tightness of throat -She has been seen by Dr. Valeta Harms who notes this is related to her post nasal drip and her Acid reflux can contribute. -She is onPrilosec2 tabs BID,allergymedicationand nasal spray and inhalers.Her insurance denied Dexilant.  -Continue to f/u withDr Icard and Dr Collene Mares andpulmonary clinic.Breathing has improved.  -She is interested in [employer], I will schedule to be done in 3 weeks.  7. Diarrhea  -Likely secondary to diet and chemo  -She can continue imodium and Iomotil. I refilled Lomotil today (04/16/20).    Plan: -I called in Lomotil today.  -Labs reviewed and adequate to proceed withPhesgo injectiontoday -continue letrozole -Lab, flush,and Phesgo injectionin3 and 6 weeks on Wednesdays -F/u in 6 weeks -f/u with cardiology with echo next week  -flu  shot today and COVID boost shot in 3 weeks    No problem-specific Assessment & Plan notes found for this encounter.   No orders of the defined types were placed in this encounter.  All questions were answered. The patient knows to call the clinic with any problems, questions or concerns. No barriers to learning was detected. The total time spent in the appointment was 30 minutes.     [name], MD 04/16/2020   I, Joslyn Devon, am acting as scribe for [name], MD.   I have reviewed the above documentation for accuracy and completeness, and I agree with the above.

## 2020-04-17 ENCOUNTER — Telehealth: Payer: Self-pay | Admitting: Hematology

## 2020-04-17 ENCOUNTER — Encounter: Payer: Medicaid Other | Admitting: Physical Therapy

## 2020-04-17 NOTE — Telephone Encounter (Signed)
Scheduled per 9/29 los. Pt is aware of appt times and dates. 

## 2020-04-21 MED FILL — DULoxetine HCL 20 MG CPEP: 20 | 30 days supply | Qty: 60 | Fill #0

## 2020-04-23 ENCOUNTER — Encounter (HOSPITAL_COMMUNITY): Payer: Self-pay | Admitting: Internal Medicine

## 2020-04-23 ENCOUNTER — Other Ambulatory Visit: Payer: Self-pay

## 2020-04-23 ENCOUNTER — Ambulatory Visit (HOSPITAL_COMMUNITY)
Admission: RE | Admit: 2020-04-23 | Discharge: 2020-04-23 | Disposition: A | Discharge status: Home / Self Care | Payer: Medicaid Other | Source: Ambulatory Visit | Attending: Internal Medicine | Admitting: Internal Medicine

## 2020-04-23 ENCOUNTER — Other Ambulatory Visit (HOSPITAL_COMMUNITY): Payer: Self-pay | Admitting: Internal Medicine

## 2020-04-23 ENCOUNTER — Ambulatory Visit (HOSPITAL_BASED_OUTPATIENT_CLINIC_OR_DEPARTMENT_OTHER)
Admission: RE | Admit: 2020-04-23 | Discharge: 2020-04-23 | Disposition: A | Discharge status: Home / Self Care | Payer: Medicaid Other | Source: Ambulatory Visit | Attending: Internal Medicine | Admitting: Internal Medicine

## 2020-04-23 VITALS — BP 140/88 | HR 70 | Ht 64.0 in | Wt 144.4 lb

## 2020-04-23 DIAGNOSIS — Z0189 Encounter for other specified special examinations: Secondary | ICD-10-CM

## 2020-04-23 DIAGNOSIS — I82621 Acute embolism and thrombosis of deep veins of right upper extremity: Secondary | ICD-10-CM | POA: Insufficient documentation

## 2020-04-23 DIAGNOSIS — K219 Gastro-esophageal reflux disease without esophagitis: Secondary | ICD-10-CM | POA: Diagnosis not present

## 2020-04-23 DIAGNOSIS — Z1501 Genetic susceptibility to malignant neoplasm of breast: Secondary | ICD-10-CM | POA: Insufficient documentation

## 2020-04-23 DIAGNOSIS — I110 Hypertensive heart disease with heart failure: Secondary | ICD-10-CM | POA: Diagnosis not present

## 2020-04-23 DIAGNOSIS — Z79899 Other long term (current) drug therapy: Secondary | ICD-10-CM | POA: Insufficient documentation

## 2020-04-23 DIAGNOSIS — Z853 Personal history of malignant neoplasm of breast: Secondary | ICD-10-CM | POA: Diagnosis not present

## 2020-04-23 DIAGNOSIS — I10 Essential (primary) hypertension: Secondary | ICD-10-CM

## 2020-04-23 DIAGNOSIS — Z7901 Long term (current) use of anticoagulants: Secondary | ICD-10-CM | POA: Insufficient documentation

## 2020-04-23 DIAGNOSIS — C50919 Malignant neoplasm of unspecified site of unspecified female breast: Secondary | ICD-10-CM

## 2020-04-23 DIAGNOSIS — Z79811 Long term (current) use of aromatase inhibitors: Secondary | ICD-10-CM | POA: Insufficient documentation

## 2020-04-23 DIAGNOSIS — Z5181 Encounter for therapeutic drug level monitoring: Secondary | ICD-10-CM | POA: Diagnosis present

## 2020-04-23 DIAGNOSIS — I700 Atherosclerosis of aorta: Secondary | ICD-10-CM | POA: Diagnosis not present

## 2020-04-23 DIAGNOSIS — Z170 Estrogen receptor positive status [ER+]: Secondary | ICD-10-CM | POA: Insufficient documentation

## 2020-04-23 DIAGNOSIS — C787 Secondary malignant neoplasm of liver and intrahepatic bile duct: Secondary | ICD-10-CM | POA: Insufficient documentation

## 2020-04-23 DIAGNOSIS — Z7951 Long term (current) use of inhaled steroids: Secondary | ICD-10-CM | POA: Diagnosis not present

## 2020-04-23 LAB — ECHOCARDIOGRAM COMPLETE
Area-P 1/2: 2.76 cm2
S' Lateral: 2.7 cm

## 2020-04-23 MED ORDER — LOSARTAN POTASSIUM 50 MG PO TABS
100.0000 mg | ORAL_TABLET | Freq: Every day | ORAL | 3 refills | Status: DC
Start: 2020-04-23 — End: 2020-04-23

## 2020-04-23 NOTE — Addendum Note (Signed)
Encounter addended by: Malena Edman, RN on: 04/23/2020 3:19 PM  Actions taken: Order list changed, Diagnosis association updated, Clinical Note Signed

## 2020-04-23 NOTE — Progress Notes (Signed)
Echocardiogram 2D Echocardiogram has been performed.  Oneal Deputy Kaydra Borgen 04/23/2020, 1:28 PM

## 2020-04-23 NOTE — Progress Notes (Signed)
Cardio-Oncology Clinic Note    Date:  04/23/2020   ID:  Teresa Hays, Teresa Hays 1955/12/15, MRN [id number]  Location: Home  Provider location: [facility] Type of Visit: New patient  PCP:  [name], DO  Cardiologist:  No primary care provider on file.  Referring: Dr. Burr Medico   History of Present Illness:  Saisha is a 64 y/o woman with GERD, HTN and metastatic breast CA referred by Dr. Burr Medico for enrollment into the Cardio-Oncology program.   SUMMARY OF ONCOLOGIC HISTORY:     Oncology History   Cancer Staging Metastatic breast cancer [facility]) Staging form: Breast, AJCC 8th Edition - Clinical stage from 03/24/2018: Stage IV (cT2, cN1, pM1, G3, ER+, PR+, HER2+) - Signed by [name], MD on 03/30/2018       Metastatic breast cancer ([place])   01/30/2018 Procedure    Colonoscopy showed small polyp in the sigmoid colon, removed, the exam of colon including the terminal ileum was otherwise negative.    01/30/2018 Procedure    EGD by Dr. Collene Mares showed small hiatal hernia, a 8 mm polypoid lesion in the cardia, biopsied.    03/09/2018 Imaging    03/09/2018 US Abdomen IMPRESSION: 1. Mass lesions throughout the liver, consistent with metastatic disease. Liver as a somewhat nodular contour suggesting underlying hepatic cirrhosis. Inhomogeneous echotexture to the liver.  2. Cholelithiasis with mild gallbladder wall thickening. A degree of cholecystitis cannot be excluded by ultrasound.  3. Portions of pancreas obscured by gas. Visualized portions of pancreas appear normal.  4. Small right kidney. Etiology uncertain. This finding potentially may be indicative of renal artery stenosis. In this regard, question whether patient is hypertensive.    03/15/2018 Imaging    CT CAP with contrast  IMPRESSION: 1. Widespread hepatic metastasis. 2. 2.6 cm lateral right breast soft tissue nodule could represent a breast primary or an  incidental benign lesion. Consider correlation with mammogram and ultrasound. 3. No definite source of primary malignancy identified within the abdomen or pelvis. There is possible rectosigmoid junction wall thickening. Consider colonoscopy with attention to this area. 4. Distal esophageal wall thickening, suggesting esophagitis.    03/24/2018 Cancer Staging    Staging form: Breast, AJCC 8th Edition - Clinical stage from 03/24/2018: Stage IV (cT2, cN1, pM1, G3, ER+, PR+, HER2+) - Signed by [name], MD on 03/30/2018    03/24/2018 Initial Biopsy    Diagnosis 1. 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 Breast, right, needle core biopsy, 9 o'clock, 7cm from nipple - INVASIVE DUCTAL CARCINOMA. -The carcinoma is somewhat morphologically dissimilar from that in part 1. It appears grade III 3. Lymph node, needle/core biopsy, right axillary - METASTATIC CARCINOMA IN 1 OF 1 LYMPH NODE (1/1).    03/24/2018 Receptors her2    Breast biopsy: 1. Estrogen Receptor: 40%, POSITIVE, STRONG-MODERATE STAINING INTENSITY Progesterone Receptor: 70%, POSITIVE, STRONG STAINING INTENSITY Proliferation Marker Ki67: 20% HER 2 equivocal by IHC 2+, POSITIVE by FISH, ratio 2.4 and copy #4.2  2. Estrogen Receptor: 60%, POSITIVE, MODERATE STAINING INTENSITY Progesterone Receptor: 40%, POSITIVE, MODERATE STAINING INTENSITY Proliferation Marker Ki67: 20% HER2 (+) by IHC 3+    03/24/2018 Initial Diagnosis    Metastatic breast cancer ([place])    03/27/2018 Pathology Results    Diagnosis Liver, needle/core biopsy, Right - METASTATIC CARCINOMA TO LIVER, CONSISTENT WITH PATIENTS CLINICAL HISTORY OF PRIMARY BREAST CARCINOMA.  ER 80%+  PR40%+ HER2- (by [facility], IHC 2+)  Ki67 50%     03/28/2018 Pathology Results    03/28/2018 Surgical Pathology Diagnosis 1. Breast, left, needle core biopsy, 9 o'clock - FIBROCYSTIC  CHANGES. - THERE IS NO EVIDENCE OF MALIGNANCY. 2. Breast, left, needle core biopsy, 2 o'clock - FIBROADENOMA. - THERE IS NO EVIDENCE OF MALIGNANCY. - SEE COMMENT.    03/29/2018 Imaging    03/29/2018 Bone Scan IMPRESSION: No scintigraphic evidence of osseous metastatic disease.    03/30/2018 Imaging    Bone scan  IMPRESSION: No scintigraphic evidence of osseous metastatic disease.     04/07/2018 -  Chemotherapy    First line chemo weekly Taxol and herceptin/Perjeta every 3 weeks starting 04/07/18. She developed infusion reaction to taxol and it was discontinued. Added Abraxane on C1D8, 2 weeks on/1 week off.  Abraxne stopped after 10/13/18 due to worsening Neuropathy    06/06/2018 Imaging    CT CAP IMPRESSION: 1. Generally improved appearance, with reduced axillary adenopathy and reduced enhancing component of the hepatic masses, with some of the hepatic mass is moderately smaller than on the prior exam. Reduced size of the right lateral breast mass compared to the prior 03/15/2018 exam. 2. New mild interstitial accentuation in the lungs, significance uncertain. Part of this appearance may be due to lower lung volumes on today's exam. 3. Mild wall thickening in the descending colon and upper rectum suggesting low-grade colitis/inflammation. Prominent stool throughout the colon favors constipation. 4. Other imaging findings of potential clinical significance: Aortic Atherosclerosis (ICD10-I70.0). Mild cardiomegaly. Mild nodularity in the right lower lobe appears stable. Contracted and thick-walled gallbladder.     09/18/2018 Imaging    CT CAP W Contrast 09/18/18  IMPRESSION: 1. Liver metastases have decreased in size. 2. Right breast mass, mild right axillary lymphadenopathy and scattered tiny right pulmonary nodules are all stable. 3. New mild left supraclavicular and left subpectoral lymphadenopathy, can not exclude progression of metastatic nodal disease. 4.  Moderate colorectal stool volume, which may indicate constipation. 5. Aortic Atherosclerosis (ICD10-I70.0).    10/2018 -  Anti-estrogen oral therapy    Letrozole 2.5 mg daily starting 10/2018     She presents today for routine f/u. Doing well. Active. No CP or SOB. Tolerating Herceptin well. SBP running 140-150.   Echo 04/23/20 04/23/20 EF 50-55% GLS -14.3% Personally reviewed   Echo 5/21 EF 50-55% GLS -15.0%   Echo 2/21 EF 55-60% GLS - 15.8%   Echo 11/20 EF 55% GLS -17.9%   Echo 9/20 EF 50-55% GLS - 14.4% Echo 02/27/19 EF 50% GLS -21.4%   Echo 07/28/18 EF 60-65% GLS -19.2% Echo 12/1018  EF 55-60% GLS -16.1%     Past Medical History:  Diagnosis Date  . GERD (gastroesophageal reflux disease)   . Hypertension   . Personal history of chemotherapy   . rt breast ca with mets to liver dx'd 03/2018   Past Surgical History:  Procedure Laterality Date  . BREAST BIOPSY Right 03/24/2018   CA x3  . BREAST BIOPSY Left 03/28/2018   neg  . BREAST BIOPSY Left 03/30/2018   neg  . COLONOSCOPY    . ESOPHAGOGASTRODUODENOSCOPY ENDOSCOPY    . IR IMAGING GUIDED PORT INSERTION  04/04/2018     Current Outpatient Medications  Medication Sig Dispense Refill  . albuterol (VENTOLIN HFA) 108 (90 Base) MCG/ACT inhaler Inhale 1-2 puffs into the lungs every 6 (six) hours as needed for wheezing or shortness of breath.    Marland Kitchen amLODipine (NORVASC) 5 MG tablet  TAKE 1 TABLET (5 MG TOTAL) BY MOUTH DAILY. 30 tablet 2  . budesonide-formoterol (SYMBICORT) 160-4.5 MCG/ACT inhaler Inhale 2 puffs into the lungs 2 (two) times daily. Rinse mouth after each use. 1 Inhaler 6  . carvedilol (COREG) 3.125 MG tablet TAKE 1 TABLET BY MOUTH TWICE DAILY. REPLACES ATENOLOL 180 tablet 3  . diphenoxylate-atropine (LOMOTIL) 2.5-0.025 MG tablet Take 1-2 tablets by mouth 4 (four) times daily as needed for diarrhea or loose stools. 60 tablet 1  . DULoxetine (CYMBALTA) 20 MG capsule Take 2 capsules (40 mg total) by mouth  daily. 60 capsule 3  . gabapentin (NEURONTIN) 300 MG capsule May take up to 3 capsules three times daily 270 capsule 3  . letrozole (FEMARA) 2.5 MG tablet TAKE 1 TABLET BY MOUTH ONCE DAILY 90 tablet 1  . lidocaine-prilocaine (EMLA) cream Apply to affected area once 30 g 3  . losartan (COZAAR) 50 MG tablet Take 1 tablet (50 mg total) by mouth daily. 90 tablet 3  . montelukast (SINGULAIR) 10 MG tablet Take 1 tablet (10 mg total) by mouth at bedtime. 30 tablet 1  . omeprazole (PRILOSEC) 40 MG capsule TAKE 1 CAPSULE BY MOUTH TWICE DAILY 60 capsule 3  . potassium chloride SA (KLOR-CON) 20 MEQ tablet TAKE 1 TABLET BY MOUTH ONCE DAILY 30 tablet 0  . rivaroxaban (XARELTO) 20 MG TABS tablet TAKE 1 TABLET BY MOUTH DAILY WITH SUPPER 30 tablet 5  . spironolactone (ALDACTONE) 25 MG tablet TAKE 1 TABLET BY MOUTH ONCE DAILY 90 tablet 3  . traMADol (ULTRAM) 50 MG tablet Take 1 tablet (50 mg total) by mouth every 6 (six) hours as needed for moderate pain or severe pain. 30 tablet 0   No current facility-administered medications for this encounter.    Allergies:   Patient has no known allergies.   Social History:  The patient  reports that she has never smoked. She has never used smokeless tobacco. She reports that she does not drink alcohol and does not use drugs.   Family History:  The patient's family history includes Breast cancer in her sister; Cancer (age of onset: 73) in her sister; Diabetes in her mother; Rheum arthritis in her sister.   ROS:  Please see the history of present illness.   All other systems are personally reviewed and negative.     Vitals:   04/23/20 1429  BP: 140/88  Pulse: 70  SpO2: 97%  Weight: 65.5 kg (144 lb 6.4 oz)  Height: $Remove'5\' 4"'nJDYHTy$  (1.626 m)     Exam:   General:  Well appearing. No resp difficulty HEENT: normal Neck: supple. no JVD. Carotids 2+ bilat; no bruits. No lymphadenopathy or thryomegaly appreciated. Cor: PMI nondisplaced. Regular rate & rhythm. No rubs,  gallops or murmurs. Lungs: clear Abdomen: soft, nontender, nondistended. No hepatosplenomegaly. No bruits or masses. Good bowel sounds. Extremities: no cyanosis, clubbing, rash, edema Neuro: alert & orientedx3, cranial nerves grossly intact. moves all 4 extremities w/o difficulty. Affect pleasant   Recent Labs: 05/29/2019: B Natriuretic Peptide 9.3 04/16/2020: ALT 16; BUN 14; Creatinine 1.24; Hemoglobin 10.4; Platelet Count 208; Potassium 4.1; Sodium 136  Personally reviewed   Wt Readings from Last 3 Encounters:  04/23/20 65.5 kg (144 lb 6.4 oz)  04/16/20 66 kg (145 lb 8 oz)  04/02/20 66 kg (145 lb 6.4 oz)      ASSESSMENT AND PLAN:  1. Breast cancer, metastatic - She has underwent first-line weekly TaxolwithHerceptin/Perjeta every 3 weeks starting 04/07/18. She developed infusion reaction to  taxol and it was discontinued. Changed to Abraxaneweekly2 weeks on/1 week offfrom 07/21/2018.Abraxane held after October 13, 2018 due to his worsening neuropathy. Remains on Herceptin/Perjeta. Now on Letrozole 2.5 mg daily starting 10/2018 - Echo 8/20 EF down to 45-50% suspicious for mild Herceptin cardiotoxicity.  - Herceptin/perjeta held  for one dose.  - Echo 9/20 EF 50-55%  - Echo 05/29/19 EF stable at 55% GLS -17.9 % - Echo 2/21  EF 55-60% GLS -15.8 - Echo 5/21 EF 50-55% GLS -15.0 - Echo today 04/23/20 EF 50-55% GLS -14.3%  - Overall I think EF remains stable despite ongoing Herceptin therapy. I do not see any convincing evidence of cardiac toxicity - Continue carvedilol 3.125 bid - With HTN will increase losartan to 100 daily - Continue spiro 25 - Continue herceptin/perjeta with close f/u as ideally will need life-long therapy - Repeat echo 4-5 months.   2. HTN  - Blood pressure elevated. Increase losartan 100 daily.    3. RUE DVT - continue Xarelto. No bleeding   Glori Bickers, MD  04/23/2020 2:53 PM  [facility] [address] [facility] [place] [address] [phone] (office) [phone] (fax)

## 2020-04-23 NOTE — Patient Instructions (Signed)
INCREASE Losartan 100mg    Your physician has requested that you have an echocardiogram. Echocardiography is a painless test that uses sound waves to create images of your heart. It provides your doctor with information about the size and shape of your heart and how well your heart's chambers and valves are working. This procedure takes approximately one hour. There are no restrictions for this procedure.   Your physician recommends that you return for a follow up visit with an echocardiogram in 4 months  If you have any questions or concerns before your next appointment please send [place] a message through [place] or call our office at [phone].    TO LEAVE A MESSAGE FOR THE NURSE SELECT OPTION 2, PLEASE LEAVE A MESSAGE INCLUDING: . YOUR NAME . DATE OF BIRTH . CALL BACK NUMBER . REASON FOR CALL**this is important as we prioritize the call backs  YOU WILL RECEIVE A CALL BACK THE SAME DAY AS LONG AS YOU CALL BEFORE 4:00 PM

## 2020-04-24 NOTE — Progress Notes (Signed)
PCCM: Thanks for seeing her Garner Nash, DO [facility] 04/24/2020 12:15 AM

## 2020-04-28 ENCOUNTER — Other Ambulatory Visit: Payer: Self-pay | Admitting: Family Medicine

## 2020-04-28 ENCOUNTER — Other Ambulatory Visit: Payer: Self-pay | Admitting: Hematology

## 2020-04-28 DIAGNOSIS — I10 Essential (primary) hypertension: Secondary | ICD-10-CM

## 2020-04-28 MED FILL — MONTELUKAST SOD 10 MG TAB: 10 | 30 days supply | Qty: 30 | Fill #1

## 2020-04-28 MED FILL — LETROZOLE 2.5 MG TABLET: 2.5 | 90 days supply | Qty: 90 | Fill #1

## 2020-04-28 MED FILL — POTASSIUM CHLORIDE CRYS ER: 20 | 30 days supply | Qty: 30 | Fill #0

## 2020-04-29 ENCOUNTER — Other Ambulatory Visit: Payer: Self-pay | Admitting: Family Medicine

## 2020-04-29 MED FILL — AMLODIPINE BESYLATE 5 MG TA: 5 | 30 days supply | Qty: 30 | Fill #0

## 2020-05-01 MED FILL — SYMBICORT 160-4.5 MCG INH: 160-4.5 | 30 days supply | Qty: 10 | Fill #3

## 2020-05-01 MED FILL — GABAPENTIN 300 MG CAPSULE: 300 | 30 days supply | Qty: 270 | Fill #1

## 2020-05-05 MED FILL — OMEPRAZOLE 40 MG CPDR: 40 | 30 days supply | Qty: 60 | Fill #1

## 2020-05-05 MED FILL — LOSARTAN POTASSIUM 50 MG TA: 50 | 45 days supply | Qty: 90 | Fill #0

## 2020-05-07 ENCOUNTER — Inpatient Hospital Stay: Payer: Medicaid Other

## 2020-05-07 ENCOUNTER — Other Ambulatory Visit: Payer: Self-pay

## 2020-05-07 ENCOUNTER — Inpatient Hospital Stay: Payer: Medicaid Other | Attending: Hematology

## 2020-05-07 VITALS — BP 143/78 | HR 66 | Temp 98.7°F | Resp 18 | Ht 64.0 in | Wt 144.5 lb

## 2020-05-07 DIAGNOSIS — R197 Diarrhea, unspecified: Secondary | ICD-10-CM | POA: Insufficient documentation

## 2020-05-07 DIAGNOSIS — C50919 Malignant neoplasm of unspecified site of unspecified female breast: Secondary | ICD-10-CM

## 2020-05-07 DIAGNOSIS — C50811 Malignant neoplasm of overlapping sites of right female breast: Secondary | ICD-10-CM | POA: Diagnosis present

## 2020-05-07 DIAGNOSIS — Z5111 Encounter for antineoplastic chemotherapy: Secondary | ICD-10-CM | POA: Insufficient documentation

## 2020-05-07 DIAGNOSIS — C787 Secondary malignant neoplasm of liver and intrahepatic bile duct: Secondary | ICD-10-CM | POA: Insufficient documentation

## 2020-05-07 DIAGNOSIS — T451X5A Adverse effect of antineoplastic and immunosuppressive drugs, initial encounter: Secondary | ICD-10-CM | POA: Diagnosis not present

## 2020-05-07 DIAGNOSIS — G620 Drug-induced polyneuropathy: Secondary | ICD-10-CM | POA: Diagnosis not present

## 2020-05-07 DIAGNOSIS — R0600 Dyspnea, unspecified: Secondary | ICD-10-CM | POA: Diagnosis not present

## 2020-05-07 DIAGNOSIS — C773 Secondary and unspecified malignant neoplasm of axilla and upper limb lymph nodes: Secondary | ICD-10-CM | POA: Diagnosis not present

## 2020-05-07 DIAGNOSIS — Z7189 Other specified counseling: Secondary | ICD-10-CM

## 2020-05-07 DIAGNOSIS — D6481 Anemia due to antineoplastic chemotherapy: Secondary | ICD-10-CM | POA: Insufficient documentation

## 2020-05-07 DIAGNOSIS — Z79899 Other long term (current) drug therapy: Secondary | ICD-10-CM | POA: Insufficient documentation

## 2020-05-07 DIAGNOSIS — R062 Wheezing: Secondary | ICD-10-CM | POA: Insufficient documentation

## 2020-05-07 DIAGNOSIS — Z7901 Long term (current) use of anticoagulants: Secondary | ICD-10-CM | POA: Diagnosis not present

## 2020-05-07 DIAGNOSIS — Z86718 Personal history of other venous thrombosis and embolism: Secondary | ICD-10-CM | POA: Diagnosis not present

## 2020-05-07 DIAGNOSIS — Z170 Estrogen receptor positive status [ER+]: Secondary | ICD-10-CM | POA: Insufficient documentation

## 2020-05-07 DIAGNOSIS — Z23 Encounter for immunization: Secondary | ICD-10-CM | POA: Diagnosis not present

## 2020-05-07 DIAGNOSIS — K219 Gastro-esophageal reflux disease without esophagitis: Secondary | ICD-10-CM | POA: Diagnosis not present

## 2020-05-07 LAB — CBC WITH DIFFERENTIAL (CANCER CENTER ONLY)
Abs Immature Granulocytes: 0.01 10*3/uL (ref 0.00–0.07)
Basophils Absolute: 0.1 10*3/uL (ref 0.0–0.1)
Basophils Relative: 1 %
Eosinophils Absolute: 0.5 10*3/uL (ref 0.0–0.5)
Eosinophils Relative: 8 %
HCT: 31.3 % — ABNORMAL LOW (ref 36.0–46.0)
Hemoglobin: 10.1 g/dL — ABNORMAL LOW (ref 12.0–15.0)
Immature Granulocytes: 0 %
Lymphocytes Relative: 29 %
Lymphs Abs: 1.9 10*3/uL (ref 0.7–4.0)
MCH: 28.6 pg (ref 26.0–34.0)
MCHC: 32.3 g/dL (ref 30.0–36.0)
MCV: 88.7 fL (ref 80.0–100.0)
Monocytes Absolute: 0.3 10*3/uL (ref 0.1–1.0)
Monocytes Relative: 5 %
Neutro Abs: 3.7 10*3/uL (ref 1.7–7.7)
Neutrophils Relative %: 57 %
Platelet Count: 224 10*3/uL (ref 150–400)
RBC: 3.53 MIL/uL — ABNORMAL LOW (ref 3.87–5.11)
RDW: 13.9 % (ref 11.5–15.5)
WBC Count: 6.4 10*3/uL (ref 4.0–10.5)
nRBC: 0 % (ref 0.0–0.2)

## 2020-05-07 LAB — CMP (CANCER CENTER ONLY)
ALT: 17 U/L (ref 0–44)
AST: 22 U/L (ref 15–41)
Albumin: 3.6 g/dL (ref 3.5–5.0)
Alkaline Phosphatase: 149 U/L — ABNORMAL HIGH (ref 38–126)
Anion gap: 7 (ref 5–15)
BUN: 16 mg/dL (ref 8–23)
CO2: 23 mmol/L (ref 22–32)
Calcium: 9.6 mg/dL (ref 8.9–10.3)
Chloride: 105 mmol/L (ref 98–111)
Creatinine: 1.22 mg/dL — ABNORMAL HIGH (ref 0.44–1.00)
GFR, Estimated: 47 mL/min — ABNORMAL LOW (ref >60)
Glucose, Bld: 96 mg/dL (ref 70–99)
Potassium: 4 mmol/L (ref 3.5–5.1)
Sodium: 135 mmol/L (ref 135–145)
Total Bilirubin: 0.3 mg/dL (ref 0.3–1.2)
Total Protein: 6.9 g/dL (ref 6.5–8.1)

## 2020-05-07 MED ORDER — SODIUM CHLORIDE 0.9 % IV SOLN
Freq: Once | INTRAVENOUS | Status: AC
  Filled 2020-05-07: qty 250

## 2020-05-07 MED ORDER — ACETAMINOPHEN 325 MG PO TABS
ORAL_TABLET | ORAL | Status: AC
  Filled 2020-05-07: qty 2

## 2020-05-07 MED ORDER — HEPARIN SOD (PORK) LOCK FLUSH 100 UNIT/ML IV SOLN
500.0000 [IU] | Freq: Once | INTRAVENOUS | Status: AC | PRN
  Administered 2020-05-07: 500 [IU]
  Filled 2020-05-07: qty 5

## 2020-05-07 MED ORDER — SODIUM CHLORIDE 0.9% FLUSH
10.0000 mL | INTRAVENOUS | Status: DC | PRN
  Administered 2020-05-07: 10 mL
  Filled 2020-05-07: qty 10

## 2020-05-07 MED ORDER — DIPHENHYDRAMINE HCL 25 MG PO CAPS
ORAL_CAPSULE | ORAL | Status: AC
  Filled 2020-05-07: qty 1

## 2020-05-07 MED ORDER — ACETAMINOPHEN 325 MG PO TABS
650.0000 mg | ORAL_TABLET | Freq: Once | ORAL | Status: AC
  Administered 2020-05-07: 650 mg via ORAL

## 2020-05-07 MED ORDER — DIPHENHYDRAMINE HCL 25 MG PO CAPS
25.0000 mg | ORAL_CAPSULE | Freq: Once | ORAL | Status: AC
  Administered 2020-05-07: 25 mg via ORAL

## 2020-05-07 MED ORDER — PERTUZ-TRASTUZ-HYALURON-ZZXF 60-60-2000 MG-MG-U/ML CHEMO ~~LOC~~ SOLN
10.0000 mL | Freq: Once | SUBCUTANEOUS | Status: AC
  Administered 2020-05-07: 10 mL via SUBCUTANEOUS
  Filled 2020-05-07: qty 10

## 2020-05-07 MED FILL — XARELTO 20 MG TABLET: 20 | 30 days supply | Qty: 30 | Fill #0

## 2020-05-07 NOTE — Patient Instructions (Signed)
[facility] Discharge Instructions for Patients Receiving Chemotherapy  Today you received the following chemotherapy agents Pertuz-[id number]-[id number] ([place]).  To help prevent nausea and vomiting after your treatment, we encourage you to take your nausea medication as prescribed.   If you develop nausea and vomiting that is not controlled by your nausea medication, call the clinic.   BELOW ARE SYMPTOMS THAT SHOULD BE REPORTED IMMEDIATELY:  *FEVER GREATER THAN 100.5 F  *CHILLS WITH OR WITHOUT FEVER  NAUSEA AND VOMITING THAT IS NOT CONTROLLED WITH YOUR NAUSEA MEDICATION  *UNUSUAL SHORTNESS OF BREATH  *UNUSUAL BRUISING OR BLEEDING  TENDERNESS IN MOUTH AND THROAT WITH OR WITHOUT PRESENCE OF ULCERS  *URINARY PROBLEMS  *BOWEL PROBLEMS  UNUSUAL RASH Items with * indicate a potential emergency and should be followed up as soon as possible.  Feel free to call the clinic should you have any questions or concerns. The clinic phone number is [phone].  Please show the [place] at check-in to the Emergency Department and triage nurse.

## 2020-05-07 NOTE — Progress Notes (Signed)
Patient received her COVID booster shot today. Was observed for 15 minutes after with no complain or concerns. Verbal and Printed education provided.

## 2020-05-07 NOTE — Patient Instructions (Signed)

## 2020-05-08 LAB — CANCER ANTIGEN 27.29: CA 27.29: 104.6 U/mL — ABNORMAL HIGH (ref 0.0–38.6)

## 2020-05-19 MED FILL — DULOXETINE HCL 20 MG CPEP: 20 | 30 days supply | Qty: 60 | Fill #1

## 2020-05-19 MED FILL — CARVEDILOL 3.125 MG TABLET: 3.125 | 90 days supply | Qty: 180 | Fill #1

## 2020-05-23 NOTE — Progress Notes (Signed)
[place]   [phone] [phone]   Clinic Follow up Note   Patient Care Team: [name], DO as PCP - General (Family Medicine) [name], MD as Consulting Physician (Gastroenterology) [name], MD as Consulting Physician (Hematology)  Date of Service:  05/28/2020  CHIEF COMPLAINT:  F/u for metastatic breast cancer  SUMMARY OF ONCOLOGIC HISTORY: Oncology History Overview Note  Cancer Staging Metastatic breast cancer [facility]) Staging form: Breast, AJCC 8th Edition - Clinical stage from 03/24/2018: Stage IV (cT2, cN1, pM1, G3, ER+, PR+, HER2+) - Signed by [name], MD on 03/30/2018     Metastatic breast cancer ([place])  01/30/2018 Procedure   Colonoscopy showed small polyp in the sigmoid colon, removed, the exam of colon including the terminal ileum was otherwise negative.   01/30/2018 Procedure   EGD by Dr. Collene Mares showed small hiatal hernia, a 8 mm polypoid lesion in the cardia, biopsied.   03/09/2018 Imaging   03/09/2018 US Abdomen IMPRESSION: 1. Mass lesions throughout the liver, consistent with metastatic disease. Liver as a somewhat nodular contour suggesting underlying hepatic cirrhosis. Inhomogeneous echotexture to the liver.  2. Cholelithiasis with mild gallbladder wall thickening. A degree of cholecystitis cannot be excluded by ultrasound.  3. Portions of pancreas obscured by gas. Visualized portions of pancreas appear normal.  4. Small right kidney. Etiology uncertain. This finding potentially may be indicative of renal artery stenosis. In this regard, question whether patient is hypertensive.   03/15/2018 Imaging   CT CAP with contrast  IMPRESSION: 1. Widespread hepatic metastasis. 2. 2.6 cm lateral right breast soft tissue nodule could represent a breast primary or an incidental benign lesion. Consider correlation with mammogram and ultrasound. 3. No definite source of primary malignancy identified within the abdomen  or pelvis. There is possible rectosigmoid junction wall thickening. Consider colonoscopy with attention to this area. 4. Distal esophageal wall thickening, suggesting esophagitis.   03/24/2018 Cancer Staging   Staging form: Breast, AJCC 8th Edition - Clinical stage from 03/24/2018: Stage IV (cT2, cN1, pM1, G3, ER+, PR+, HER2+) - Signed by [name], MD on 03/30/2018   03/24/2018 Initial Biopsy   Diagnosis 1. 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 Breast, right, needle core biopsy, 9 o'clock, 7cm from nipple - INVASIVE DUCTAL CARCINOMA. -The carcinoma is somewhat morphologically dissimilar from that in part 1. It appears grade III 3. Lymph node, needle/core biopsy, right axillary - METASTATIC CARCINOMA IN 1 OF 1 LYMPH NODE (1/1).   03/24/2018 Receptors her2   Breast biopsy: 1. Estrogen Receptor: 40%, POSITIVE, STRONG-MODERATE STAINING INTENSITY Progesterone Receptor: 70%, POSITIVE, STRONG STAINING INTENSITY Proliferation Marker Ki67: 20% HER 2 equivocal by IHC 2+, POSITIVE by FISH, ratio 2.4 and copy #4.2  2. Estrogen Receptor: 60%, POSITIVE, MODERATE STAINING INTENSITY Progesterone Receptor: 40%, POSITIVE, MODERATE STAINING INTENSITY Proliferation Marker Ki67: 20% HER2 (+) by IHC 3+   03/24/2018 Initial Diagnosis   Metastatic breast cancer ([place])   03/27/2018 Pathology Results   Diagnosis Liver, needle/core biopsy, Right - METASTATIC CARCINOMA TO LIVER, CONSISTENT WITH PATIENTS CLINICAL HISTORY OF PRIMARY BREAST CARCINOMA.  ER 80%+ PR40%+ HER2- (by [facility], IHC 2+)  Ki67 50%    03/28/2018 Pathology Results   03/28/2018 Surgical Pathology Diagnosis 1. Breast, left, needle core biopsy, 9 o'clock - FIBROCYSTIC CHANGES. - THERE IS NO EVIDENCE OF MALIGNANCY. 2. Breast, left, needle core biopsy, 2 o'clock - FIBROADENOMA. - THERE IS NO EVIDENCE  OF MALIGNANCY. - SEE COMMENT.   03/29/2018 Imaging    03/29/2018 Bone Scan IMPRESSION: No scintigraphic evidence of osseous metastatic disease.   03/30/2018 Imaging   Bone scan  IMPRESSION: No scintigraphic evidence of osseous metastatic disease.    04/07/2018 -  Chemotherapy   First line chemo weekly Taxol and herceptin/Perjeta every 3 weeks starting 04/07/18. She developed infusion reaction to taxol and it was discontinued. Added Abraxane on C1D8, 2 weeks on/1 week off.  Abraxne stopped after 10/13/18 due to worsening Neuropathy. She has continued with maintenance Herceptin injection/Perjeta q3weeks. Herceptin changed to injection on 04/06/19. Both injections switched to combination Phesgo on 10/01/19.   06/06/2018 Imaging   CT CAP IMPRESSION: 1. Generally improved appearance, with reduced axillary adenopathy and reduced enhancing component of the hepatic masses, with some of the hepatic mass is moderately smaller than on the prior exam. Reduced size of the right lateral breast mass compared to the prior 03/15/2018 exam. 2. New mild interstitial accentuation in the lungs, significance uncertain. Part of this appearance may be due to lower lung volumes on today's exam. 3. Mild wall thickening in the descending colon and upper rectum suggesting low-grade colitis/inflammation. Prominent stool throughout the colon favors constipation. 4. Other imaging findings of potential clinical significance: Aortic Atherosclerosis (ICD10-I70.0). Mild cardiomegaly. Mild nodularity in the right lower lobe appears stable. Contracted and thick-walled gallbladder.    09/18/2018 Imaging   CT CAP W Contrast 09/18/18  IMPRESSION: 1. Liver metastases have decreased in size. 2. Right breast mass, mild right axillary lymphadenopathy and scattered tiny right pulmonary nodules are all stable. 3. New mild left supraclavicular and left subpectoral lymphadenopathy, can not exclude progression of metastatic nodal disease. 4. Moderate colorectal stool volume, which may  indicate constipation. 5.  Aortic Atherosclerosis (ICD10-I70.0).   10/2018 -  Anti-estrogen oral therapy   Letrozole 2.5 mg daily starting 10/2018   01/10/2019 Imaging   CT CAP W Contrast 01/10/19  IMPRESSION: 1. Continued improvement in the hepatic metastatic lesions which have reduced in size. 2. Stable mild left supraclavicular and subpectoral adenopathy. 3. Essentially stable small right lower lobe pulmonary nodule and separate small subpleural nodule along the right hemidiaphragm. Surveillance suggested. 4. Other imaging findings of potential clinical significance: Mild cardiomegaly. Mild circumferential distal esophageal wall thickening, the most common cause would be esophagitis. Airway thickening is present, suggesting bronchitis or reactive airways disease. Airway plugging in the lower lobes and in the right middle lobe. Stable small right adrenal adenoma.   05/15/2019 Imaging   CT CAP W Contrast 05/15/19  IMPRESSION: CT CHEST IMPRESSION   1. Similar appearance of borderline supraclavicular, axillary, and subpectoral adenopathy. 2. Improved and resolved right lower lobe pulmonary nodularity. 3. Esophageal air fluid level suggests dysmotility or gastroesophageal reflux.   CT ABDOMEN AND PELVIS IMPRESSION   1. Improved hepatic metastasis. 2. Small bowel mesenteric lymph nodes which are upper normal and mildly enlarged. Likely increased and similar as detailed above. Indeterminate. Recommend attention on follow-up. 3. Cholelithiasis. 4. Motion degradation throughout the lower chest and abdomen.   09/19/2019 Imaging   CT CAP W contrast  IMPRESSION: 1. Interval decrease in size of right axillary and subpectoral lymph nodes. There are no pathologically enlarged lymph nodes remaining in the chest, abdomen, or pelvis. No new lymphadenopathy. 2. No significant change in post treatment appearance of multiple low-attenuation liver lesions, in keeping with treated  metastases. 3. No evidence of new metastatic disease in the chest, abdomen, or pelvis. 4. Aortic Atherosclerosis (ICD10-I70.0).  11/16/2019 Imaging   IMRI Brain  MPRESSION: Minimal chronic microvascular ischemic changes. No acute intracranial process.   Active bilateral maxillary sinus disease with mild frontoethmoid mucosal thickening.   01/23/2020 Imaging   CT CAP W contrast  IMPRESSION: 1. Stable exam. No new or progressive interval findings. 2. Stable appearance of upper normal right axillary lymph nodes. Tiny subpectoral and left axillary nodes are unchanged. 3. Generally similar appearance of ill-defined hypoattenuating lesions in the liver compatible with treated metastases. 1 lesion in the central liver is minimally more conspicuous today, likely related to bolus timing and attention on follow-up recommended. No new suspicious liver lesion on today's study. 4. Stable 10 mm right adrenal nodule., indeterminate. Continued attention on follow-up imaging recommended. 5. Cholelithiasis. 6. Aortic Atherosclerosis (ICD10-I70.0).   02/11/2020 Breast MRI   IMPRESSION: 1. 7 millimeter focus of residual enhancement associated with the known malignancy in the 9 o'clock location of the RIGHT breast. 2. No significant enhancement in the known malignancy in the 11:30 o'clock location of the RIGHT breast. 3. Interval resolution of axillary adenopathy.      CURRENT THERAPY:  -Herceptin injection/Perjeta q3weeksmaintenance therapy.Herceptin changed to injectionon9/18/20.Both injections switched to combination Phesgo on 10/01/19. -Letrozole 2.5 mg daily starting 10/2018  INTERVAL HISTORY:  Teresa Hays is here for a follow up. She presents to the clinic alone.She notes she has mild wheezing since last week. This is better than before. She feels her Symbicort is not enough. She is still able to maintain her activities. She notes she has neuropathy at the tip of her fingers  and toes. She has adequate function and denies falls. She notes no pain and she is eating well. She notes she uses tramadol every other night for her feet pain.    REVIEW OF SYSTEMS:   Constitutional: Denies fevers, chills or abnormal weight loss Eyes: Denies blurriness of vision Ears, nose, mouth, throat, and face: Denies mucositis or sore throat Respiratory: Denies cough, dyspnea (+) mild wheezes Cardiovascular: Denies palpitation, chest discomfort or lower extremity swelling Gastrointestinal:  Denies nausea, heartburn or change in bowel habits Skin: Denies abnormal skin rashes Lymphatics: Denies new lymphadenopathy or easy bruising Neurological: (+) residual neuropathy of fingers and toes Behavioral/Psych: Mood is stable, no new changes  All other systems were reviewed with the patient and are negative.  MEDICAL HISTORY:  Past Medical History:  Diagnosis Date  . GERD (gastroesophageal reflux disease)   . Hypertension   . Personal history of chemotherapy   . rt breast ca with mets to liver dx'd 03/2018    SURGICAL HISTORY: Past Surgical History:  Procedure Laterality Date  . BREAST BIOPSY Right 03/24/2018   CA x3  . BREAST BIOPSY Left 03/28/2018   neg  . BREAST BIOPSY Left 03/30/2018   neg  . COLONOSCOPY    . ESOPHAGOGASTRODUODENOSCOPY ENDOSCOPY    . IR IMAGING GUIDED PORT INSERTION  04/04/2018    I have reviewed the social history and family history with the patient and they are unchanged from previous note.  ALLERGIES:  has No Known Allergies.  MEDICATIONS:  Current Outpatient Medications  Medication Sig Dispense Refill  . albuterol (VENTOLIN HFA) 108 (90 Base) MCG/ACT inhaler Inhale 1-2 puffs into the lungs every 6 (six) hours as needed for wheezing or shortness of breath.    Marland Kitchen amLODipine (NORVASC) 5 MG tablet TAKE 1 TABLET BY MOUTH DAILY. 30 tablet 2  . budesonide-formoterol (SYMBICORT) 160-4.5 MCG/ACT inhaler Inhale 2 puffs into the lungs 2 (two) times daily.  Rinse mouth after each use. 1 Inhaler 6  . carvedilol (COREG) 3.125 MG tablet TAKE 1 TABLET BY MOUTH TWICE DAILY. REPLACES ATENOLOL 180 tablet 3  . diphenoxylate-atropine (LOMOTIL) 2.5-0.025 MG tablet Take 1-2 tablets by mouth 4 (four) times daily as needed for diarrhea or loose stools. 60 tablet 1  . DULoxetine (CYMBALTA) 20 MG capsule Take 2 capsules (40 mg total) by mouth daily. 60 capsule 3  . gabapentin (NEURONTIN) 300 MG capsule May take up to 3 capsules three times daily 270 capsule 3  . letrozole (FEMARA) 2.5 MG tablet TAKE 1 TABLET BY MOUTH ONCE DAILY 90 tablet 1  . lidocaine-prilocaine (EMLA) cream Apply to affected area once 30 g 3  . losartan (COZAAR) 50 MG tablet Take 2 tablets (100 mg total) by mouth daily. 90 tablet 3  . montelukast (SINGULAIR) 10 MG tablet TAKE 1 TABLET (10 MG TOTAL) BY MOUTH AT BEDTIME. 30 tablet 5  . omeprazole (PRILOSEC) 40 MG capsule TAKE 1 CAPSULE BY MOUTH TWICE DAILY 60 capsule 3  . potassium chloride SA (KLOR-CON) 20 MEQ tablet TAKE 1 TABLET BY MOUTH ONCE DAILY 30 tablet 3  . rivaroxaban (XARELTO) 20 MG TABS tablet TAKE 1 TABLET BY MOUTH DAILY WITH SUPPER 30 tablet 5  . spironolactone (ALDACTONE) 25 MG tablet TAKE 1 TABLET BY MOUTH ONCE DAILY 90 tablet 3  . traMADol (ULTRAM) 50 MG tablet Take 1 tablet (50 mg total) by mouth every 6 (six) hours as needed for moderate pain or severe pain. 30 tablet 0   No current facility-administered medications for this visit.   Facility-Administered Medications Ordered in Other Visits  Medication Dose Route Frequency Provider Last Rate Last Admin  . acetaminophen (TYLENOL) tablet 650 mg  650 mg Oral Once [name], MD      . diphenhydrAMINE (BENADRYL) capsule 25 mg  25 mg Oral Once [name], MD      . pertuz-trastuz-hyaluron-zzxf ([place]) [phone] MG-MG-U/10ML chemo SQ injection maintenance dose 10 mL  10 mL Subcutaneous Once [name], MD      . sodium chloride flush (NS) 0.9 % injection 10 mL  10 mL Intracatheter  PRN [name], MD        PHYSICAL EXAMINATION: ECOG PERFORMANCE STATUS: 2 - Symptomatic, <50% confined to bed  Vitals:   05/28/20 0832  BP: (!) 142/87  Pulse: 74  Resp: 18  Temp: 97.8 F (36.6 C)  SpO2: 100%   Filed Weights   05/28/20 0832  Weight: 147 lb (66.7 kg)    GENERAL:alert, no distress and comfortable SKIN: skin color, texture, turgor are normal, no rashes or significant lesions EYES: normal, Conjunctiva are pink and non-injected, sclera clear  NECK: supple, thyroid normal size, non-tender, without nodularity LYMPH:  no palpable lymphadenopathy in the cervical, axillary  LUNGS: clear to auscultation and percussion (+) Mild wheezing  HEART: regular rate & rhythm and no murmurs and no lower extremity edema ABDOMEN:abdomen soft, non-tender and normal bowel sounds Musculoskeletal:no cyanosis of digits and no clubbing  NEURO: alert & oriented x 3 with fluent speech, no focal motor/sensory deficits BREAST: (+) Right breast mass not palpable. Left Breast exam benign.   LABORATORY DATA:  I have reviewed the data as listed CBC Latest Ref Rng & Units 05/28/2020 05/07/2020 04/16/2020  WBC 4.0 - 10.5 K/uL 5.6 6.4 7.6  Hemoglobin 12.0 - 15.0 g/dL 10.1(L) 10.1(L) 10.4(L)  Hematocrit 36 - 46 % 31.4(L) 31.3(L) 32.1(L)  Platelets 150 - 400 K/uL 198 224 208  CMP Latest Ref Rng & Units 05/28/2020 05/07/2020 04/16/2020  Glucose 70 - 99 mg/dL 93 96 90  BUN 8 - 23 mg/dL _0 Creatinine 0.44 - 1.00 mg/dL 1.14(H) 1.22(H) 1.24(H)  Sodium 135 - 145 mmol/L 137 135 136  Potassium 3.5 - 5.1 mmol/L 3.9 4.0 4.1  Chloride 98 - 111 mmol/L 105 105 107  CO2 22 - 32 mmol/L _1 Calcium 8.9 - 10.3 mg/dL 9.3 9.6 9.3  Total Protein 6.5 - 8.1 g/dL 7.0 6.9 7.2  Total Bilirubin 0.3 - 1.2 mg/dL 0.4 0.3 0.3  Alkaline Phos 38 - 126 U/L 150(H) 149(H) 129(H)  AST 15 - 41 U/L 33 22 22  ALT 0 - 44 U/L _2 RADIOGRAPHIC STUDIES: I have personally reviewed the radiological  images as listed and agreed with the findings in the report. No results found.   ASSESSMENT & PLAN:  AILENE ROYAL is a 64 y.o. female with    1. 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 Liver met was HER2-.  -Given the metastatic disease, her cancer is not curable but still treatable. Shehas been treated withLetrozole daily andFirst-line Herceptin/Perjetaevery 3 weeks and weekly Taxol. Due to infusionreaction, taxol was switched to Abraxane.We stoppedAbraxaneafter cycle 10 due to worsening neuropathyand continued with Herceptin/Perjetaas maintenance therapy.Herceptin changed to injectionon9/18/20.Both injections switched to combination Phesgo on 10/01/19. -Given good response in breast, she previously discussed the option of right Lumpectomy and axillary LN dissection to reduce her tumor burden with Dr Brantley Stage. He did not recommend surgery at this time given her diffuse liver metastasis. She is agreeable to hold on surgery.  -Her tumor marker has recently slightly increased to 104.6. If her tumor marker continues to increase significantly I would scan her sooner. Plan for next CT scan in Jan -She continues to tolerate treatment well. Labs reviewed, stable and adequate to proceed with Phesgo injection today. Willcontinue every 3 weeks. -Continue Letrozole  -f/u in9 weeks    2.Peripheral neuropathy, grade 2, Right Foot pain,Secondarytochemotherapy -Ipreviouslystropped Abraxane on 10/13/18. -S/p PT and steroid injection her right foot pain is controlled. She still has mild tingling neuropathy in her toes an fingers. She has adequate function and denies falls. I discussed this can take time to  resolve and may not fully resolve.  -She will also continue OTC Calcium and Vit D,OralB12, Gabapentin 339m3 tabsTID andCymbalta to 425m Tramadol every other night for right foot pain. Well controlled.   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 Anemia, Secondary to chemo -Continue monitoring, will consider blood transfusion if hemoglobin less than 8. -Mild and stable.  5Social support, Goal of Care Discussion -She lives alone, [place]  -On Ativan PRN due to anxietyabout diagnosis and treatment -She is full code  6. Recurrentdyspnea and wheezing,Acid Reflux,Tightness of throat -She has been seen by Dr. IcaValeta Harmso notes this is related to her post nasal drip and her Acid reflux can contribute. -She is onPrilosec2 tabs BID,allergymedicationand nasal spray and inhalers.Her insurance denied Dexilant.  -Continue to f/u withDr Icard and Dr ManCollene Maresdpulmonary clinic. -This week she notes very mild wheezing. I encouraged her to f/u with Dr IcaValeta Harms  for better control.    Plan: - I refilled Tramadol today  -Labs reviewed and adequate to proceed withPhesgo injectiontoday -continue letrozole -Lab, flush,andPhesgoinjectionin3,  6, 9 weeks on Wednesdays -F/u in 9 weeks with CT CAP w contrast a few days before for restaging     No problem-specific Assessment & Plan notes found for this encounter.   Orders Placed This Encounter  Procedures  . CT CHEST ABDOMEN PELVIS W CONTRAST    Standing Status:   Future    Standing Expiration Date:   05/28/2021    Order Specific Question:   If indicated for the ordered procedure, I authorize the administration of contrast media per Radiology protocol    Answer:   Yes    Order Specific Question:   Preferred imaging location?    Answer:   [facility]    Order Specific Question:   Is Oral Contrast requested for this exam?     Answer:   Yes, Per Radiology protocol    Order Specific Question:   Reason for Exam (SYMPTOM  OR DIAGNOSIS REQUIRED)    Answer:   restaging   All questions were answered. The patient knows to call the clinic with any problems, questions or concerns. No barriers to learning was detected. The total time spent in the appointment was 30 minutes.     [name], MD 05/28/2020   I, Joslyn Devon, am acting as scribe for [name], MD.   I have reviewed the above documentation for accuracy and completeness, and I agree with the above.

## 2020-05-26 ENCOUNTER — Other Ambulatory Visit: Payer: Self-pay | Admitting: Hematology

## 2020-05-26 ENCOUNTER — Other Ambulatory Visit: Payer: Self-pay | Admitting: Primary Care

## 2020-05-26 MED FILL — MONTELUKAST SOD 10 MG TAB: 10 | 30 days supply | Qty: 30 | Fill #0

## 2020-05-26 MED FILL — AMLODIPINE BESYLATE 5 MG TA: 5 | 30 days supply | Qty: 30 | Fill #1

## 2020-05-27 ENCOUNTER — Other Ambulatory Visit: Payer: Self-pay | Admitting: Hematology

## 2020-05-27 MED FILL — POTASSIUM CHLORIDE CRYS ER: 20 | 30 days supply | Qty: 30 | Fill #0

## 2020-05-28 ENCOUNTER — Other Ambulatory Visit: Payer: Medicaid Other

## 2020-05-28 ENCOUNTER — Inpatient Hospital Stay: Payer: Medicaid Other

## 2020-05-28 ENCOUNTER — Inpatient Hospital Stay: Payer: Medicaid Other | Attending: Hematology

## 2020-05-28 ENCOUNTER — Inpatient Hospital Stay (HOSPITAL_BASED_OUTPATIENT_CLINIC_OR_DEPARTMENT_OTHER): Payer: Medicaid Other | Admitting: Hematology

## 2020-05-28 ENCOUNTER — Other Ambulatory Visit: Payer: Self-pay

## 2020-05-28 ENCOUNTER — Encounter: Payer: Self-pay | Admitting: Hematology

## 2020-05-28 ENCOUNTER — Other Ambulatory Visit: Payer: Self-pay | Admitting: Hematology

## 2020-05-28 ENCOUNTER — Ambulatory Visit: Payer: Medicaid Other | Admitting: Hematology

## 2020-05-28 ENCOUNTER — Ambulatory Visit: Payer: Medicaid Other

## 2020-05-28 VITALS — BP 142/87 | HR 74 | Temp 97.8°F | Resp 18 | Ht 64.0 in | Wt 147.0 lb

## 2020-05-28 VITALS — BP 137/89 | HR 69 | Temp 98.3°F | Resp 18

## 2020-05-28 DIAGNOSIS — Z79899 Other long term (current) drug therapy: Secondary | ICD-10-CM | POA: Diagnosis not present

## 2020-05-28 DIAGNOSIS — Z95828 Presence of other vascular implants and grafts: Secondary | ICD-10-CM

## 2020-05-28 DIAGNOSIS — Z7189 Other specified counseling: Secondary | ICD-10-CM

## 2020-05-28 DIAGNOSIS — D6481 Anemia due to antineoplastic chemotherapy: Secondary | ICD-10-CM | POA: Insufficient documentation

## 2020-05-28 DIAGNOSIS — I700 Atherosclerosis of aorta: Secondary | ICD-10-CM | POA: Insufficient documentation

## 2020-05-28 DIAGNOSIS — R590 Localized enlarged lymph nodes: Secondary | ICD-10-CM | POA: Diagnosis not present

## 2020-05-28 DIAGNOSIS — J320 Chronic maxillary sinusitis: Secondary | ICD-10-CM | POA: Diagnosis not present

## 2020-05-28 DIAGNOSIS — K449 Diaphragmatic hernia without obstruction or gangrene: Secondary | ICD-10-CM | POA: Diagnosis not present

## 2020-05-28 DIAGNOSIS — D3501 Benign neoplasm of right adrenal gland: Secondary | ICD-10-CM | POA: Insufficient documentation

## 2020-05-28 DIAGNOSIS — K8020 Calculus of gallbladder without cholecystitis without obstruction: Secondary | ICD-10-CM | POA: Insufficient documentation

## 2020-05-28 DIAGNOSIS — Z5111 Encounter for antineoplastic chemotherapy: Secondary | ICD-10-CM | POA: Insufficient documentation

## 2020-05-28 DIAGNOSIS — Z79811 Long term (current) use of aromatase inhibitors: Secondary | ICD-10-CM | POA: Diagnosis not present

## 2020-05-28 DIAGNOSIS — C50919 Malignant neoplasm of unspecified site of unspecified female breast: Secondary | ICD-10-CM

## 2020-05-28 DIAGNOSIS — G629 Polyneuropathy, unspecified: Secondary | ICD-10-CM | POA: Insufficient documentation

## 2020-05-28 DIAGNOSIS — C50811 Malignant neoplasm of overlapping sites of right female breast: Secondary | ICD-10-CM | POA: Insufficient documentation

## 2020-05-28 DIAGNOSIS — R062 Wheezing: Secondary | ICD-10-CM | POA: Diagnosis not present

## 2020-05-28 DIAGNOSIS — Z170 Estrogen receptor positive status [ER+]: Secondary | ICD-10-CM | POA: Insufficient documentation

## 2020-05-28 DIAGNOSIS — I6782 Cerebral ischemia: Secondary | ICD-10-CM | POA: Diagnosis not present

## 2020-05-28 DIAGNOSIS — C787 Secondary malignant neoplasm of liver and intrahepatic bile duct: Secondary | ICD-10-CM | POA: Diagnosis not present

## 2020-05-28 DIAGNOSIS — Z7901 Long term (current) use of anticoagulants: Secondary | ICD-10-CM | POA: Insufficient documentation

## 2020-05-28 DIAGNOSIS — K2100 Gastro-esophageal reflux disease with esophagitis, without bleeding: Secondary | ICD-10-CM | POA: Insufficient documentation

## 2020-05-28 DIAGNOSIS — N270 Small kidney, unilateral: Secondary | ICD-10-CM | POA: Insufficient documentation

## 2020-05-28 DIAGNOSIS — T451X5A Adverse effect of antineoplastic and immunosuppressive drugs, initial encounter: Secondary | ICD-10-CM | POA: Diagnosis not present

## 2020-05-28 LAB — CBC WITH DIFFERENTIAL (CANCER CENTER ONLY)
Abs Immature Granulocytes: 0.01 10*3/uL (ref 0.00–0.07)
Basophils Absolute: 0.1 10*3/uL (ref 0.0–0.1)
Basophils Relative: 1 %
Eosinophils Absolute: 0.7 10*3/uL — ABNORMAL HIGH (ref 0.0–0.5)
Eosinophils Relative: 13 %
HCT: 31.4 % — ABNORMAL LOW (ref 36.0–46.0)
Hemoglobin: 10.1 g/dL — ABNORMAL LOW (ref 12.0–15.0)
Immature Granulocytes: 0 %
Lymphocytes Relative: 31 %
Lymphs Abs: 1.7 10*3/uL (ref 0.7–4.0)
MCH: 28.4 pg (ref 26.0–34.0)
MCHC: 32.2 g/dL (ref 30.0–36.0)
MCV: 88.2 fL (ref 80.0–100.0)
Monocytes Absolute: 0.3 10*3/uL (ref 0.1–1.0)
Monocytes Relative: 5 %
Neutro Abs: 2.8 10*3/uL (ref 1.7–7.7)
Neutrophils Relative %: 50 %
Platelet Count: 198 10*3/uL (ref 150–400)
RBC: 3.56 MIL/uL — ABNORMAL LOW (ref 3.87–5.11)
RDW: 13.8 % (ref 11.5–15.5)
WBC Count: 5.6 10*3/uL (ref 4.0–10.5)
nRBC: 0 % (ref 0.0–0.2)

## 2020-05-28 LAB — CMP (CANCER CENTER ONLY)
ALT: 26 U/L (ref 0–44)
AST: 33 U/L (ref 15–41)
Albumin: 3.6 g/dL (ref 3.5–5.0)
Alkaline Phosphatase: 150 U/L — ABNORMAL HIGH (ref 38–126)
Anion gap: 9 (ref 5–15)
BUN: 9 mg/dL (ref 8–23)
CO2: 23 mmol/L (ref 22–32)
Calcium: 9.3 mg/dL (ref 8.9–10.3)
Chloride: 105 mmol/L (ref 98–111)
Creatinine: 1.14 mg/dL — ABNORMAL HIGH (ref 0.44–1.00)
GFR, Estimated: 54 mL/min — ABNORMAL LOW (ref >60)
Glucose, Bld: 93 mg/dL (ref 70–99)
Potassium: 3.9 mmol/L (ref 3.5–5.1)
Sodium: 137 mmol/L (ref 135–145)
Total Bilirubin: 0.4 mg/dL (ref 0.3–1.2)
Total Protein: 7 g/dL (ref 6.5–8.1)

## 2020-05-28 MED ORDER — TRAMADOL HCL 50 MG PO TABS
50.0000 mg | ORAL_TABLET | Freq: Four times a day (QID) | ORAL | 0 refills | Status: DC | PRN
Start: 2020-05-28 — End: 2020-05-28

## 2020-05-28 MED ORDER — ACETAMINOPHEN 325 MG PO TABS
ORAL_TABLET | ORAL | Status: AC
  Filled 2020-05-28: qty 2

## 2020-05-28 MED ORDER — DIPHENHYDRAMINE HCL 25 MG PO CAPS
25.0000 mg | ORAL_CAPSULE | Freq: Once | ORAL | Status: AC
  Administered 2020-05-28: 25 mg via ORAL

## 2020-05-28 MED ORDER — SODIUM CHLORIDE 0.9% FLUSH
10.0000 mL | INTRAVENOUS | Status: AC | PRN
  Administered 2020-05-28: 10 mL
  Filled 2020-05-28: qty 10

## 2020-05-28 MED ORDER — HEPARIN SOD (PORK) LOCK FLUSH 100 UNIT/ML IV SOLN
500.0000 [IU] | Freq: Once | INTRAVENOUS | Status: AC | PRN
  Administered 2020-05-28: 500 [IU]
  Filled 2020-05-28: qty 5

## 2020-05-28 MED ORDER — ACETAMINOPHEN 325 MG PO TABS
650.0000 mg | ORAL_TABLET | Freq: Once | ORAL | Status: AC
  Administered 2020-05-28: 650 mg via ORAL

## 2020-05-28 MED ORDER — SODIUM CHLORIDE 0.9% FLUSH
10.0000 mL | INTRAVENOUS | Status: DC | PRN
  Administered 2020-05-28: 10 mL
  Filled 2020-05-28: qty 10

## 2020-05-28 MED ORDER — DIPHENHYDRAMINE HCL 25 MG PO CAPS
ORAL_CAPSULE | ORAL | Status: AC
  Filled 2020-05-28: qty 1

## 2020-05-28 MED ORDER — PERTUZ-TRASTUZ-HYALURON-ZZXF 60-60-2000 MG-MG-U/ML CHEMO ~~LOC~~ SOLN
10.0000 mL | Freq: Once | SUBCUTANEOUS | Status: AC
  Administered 2020-05-28: 10 mL via SUBCUTANEOUS
  Filled 2020-05-28: qty 10

## 2020-05-28 MED FILL — traMADol HCL 50 MG TABS: 50 | 7 days supply | Qty: 30 | Fill #0

## 2020-05-28 NOTE — Patient Instructions (Signed)
[place] Discharge Instructions for Patients Receiving Chemotherapy  Today you received the following chemotherapy agents Pertuz-trastuz-hyaluron-zzxf ([place]).  To help prevent nausea and vomiting after your treatment, we encourage you to take your nausea medication as prescribed.   If you develop nausea and vomiting that is not controlled by your nausea medication, call the clinic.   BELOW ARE SYMPTOMS THAT SHOULD BE REPORTED IMMEDIATELY:  *FEVER GREATER THAN 100.5 F  *CHILLS WITH OR WITHOUT FEVER  NAUSEA AND VOMITING THAT IS NOT CONTROLLED WITH YOUR NAUSEA MEDICATION  *UNUSUAL SHORTNESS OF BREATH  *UNUSUAL BRUISING OR BLEEDING  TENDERNESS IN MOUTH AND THROAT WITH OR WITHOUT PRESENCE OF ULCERS  *URINARY PROBLEMS  *BOWEL PROBLEMS  UNUSUAL RASH Items with * indicate a potential emergency and should be followed up as soon as possible.  Feel free to call the clinic should you have any questions or concerns. The clinic phone number is [phone].  Please show the [place] at check-in to the Emergency Department and triage nurse.

## 2020-05-28 NOTE — Addendum Note (Signed)
Addended by: Lin Givens on: 05/28/2020 08:40 AM   Modules accepted: Orders

## 2020-05-28 NOTE — Progress Notes (Signed)
Patient remained 15 min post obs after phesgo injection.  VSS. Patient ambulated from facility in stable condition.

## 2020-05-30 ENCOUNTER — Telehealth: Payer: Self-pay | Admitting: Primary Care

## 2020-05-30 ENCOUNTER — Other Ambulatory Visit: Payer: Self-pay | Admitting: Primary Care

## 2020-05-30 MED ORDER — PREDNISONE 10 MG PO TABS: ORAL_TABLET | ORAL | 0 refills | Status: DC

## 2020-05-30 MED FILL — predniSONE 10 MG TABS: 10 | 5 days supply | Qty: 10 | Fill #0

## 2020-05-30 NOTE — Telephone Encounter (Signed)
I'll send in 5 days of prednisone. She can use albuterol rescue inhaler 2 puffs every 6 hours as needed for breakthrough shortness of breath/wheezing. Recommend mucinex 600mg  twice daily. Continue Symbicort as prescribed

## 2020-05-30 NOTE — Telephone Encounter (Signed)
Spoke with pt and notified of response per [facility]  She verbalized understanding and rx was sent to pharm already  Pt advised to call back if not improving

## 2020-05-30 NOTE — Telephone Encounter (Signed)
Called and spoke with pt who states she began coughing and wheezing 3 days ago after her neighbor began to clean their carpet. Pt said she believes they used a bleach cleaner to clean  Pt is coughing up occ yellow-green phlegm. Pt said symptoms are bad all throughout the day.  Pt denies any complaints of fever. Pt denies any chest discomfort.  Pt is taking mucinex daily, her singulair at bedtime, her symbicort inhaler twice daily as prescribed.  Pt said that she has not used her albuterol inhaler as she did not know she could use it if needed in between her symbicort doses. I stated to pt that she could use her albuterol inhaler every 4-6 hours if needed and she verbalized understanding.  Pt wants to know what could be recommended to help with her symptoms. Beth, please advise.

## 2020-06-02 MED FILL — XARELTO 20 MG TABLET: 20 | 30 days supply | Qty: 30 | Fill #1

## 2020-06-02 MED FILL — GABAPENTIN 300 MG CAPSULE: 300 | 30 days supply | Qty: 270 | Fill #2

## 2020-06-02 MED FILL — OMEPRAZOLE 40 MG CPDR: 40 | 30 days supply | Qty: 60 | Fill #2

## 2020-06-16 ENCOUNTER — Encounter: Payer: Self-pay | Admitting: Hematology

## 2020-06-16 MED FILL — DULOXETINE HCL 20 MG CPEP: 20 | 30 days supply | Qty: 60 | Fill #2

## 2020-06-16 MED FILL — SPIRONOLACTONE 25 MG TABS: 25 | 90 days supply | Qty: 90 | Fill #2

## 2020-06-17 ENCOUNTER — Encounter: Payer: Self-pay | Admitting: Family Medicine

## 2020-06-18 ENCOUNTER — Inpatient Hospital Stay: Payer: Medicaid Other | Attending: Hematology

## 2020-06-18 ENCOUNTER — Inpatient Hospital Stay: Payer: Medicaid Other

## 2020-06-18 ENCOUNTER — Other Ambulatory Visit: Payer: Self-pay

## 2020-06-18 VITALS — BP 136/96 | HR 70 | Temp 98.5°F | Resp 18 | Ht 64.0 in | Wt 147.0 lb

## 2020-06-18 DIAGNOSIS — G629 Polyneuropathy, unspecified: Secondary | ICD-10-CM | POA: Insufficient documentation

## 2020-06-18 DIAGNOSIS — D6481 Anemia due to antineoplastic chemotherapy: Secondary | ICD-10-CM | POA: Diagnosis not present

## 2020-06-18 DIAGNOSIS — C50919 Malignant neoplasm of unspecified site of unspecified female breast: Secondary | ICD-10-CM

## 2020-06-18 DIAGNOSIS — C787 Secondary malignant neoplasm of liver and intrahepatic bile duct: Secondary | ICD-10-CM | POA: Diagnosis not present

## 2020-06-18 DIAGNOSIS — R0600 Dyspnea, unspecified: Secondary | ICD-10-CM | POA: Insufficient documentation

## 2020-06-18 DIAGNOSIS — T451X5A Adverse effect of antineoplastic and immunosuppressive drugs, initial encounter: Secondary | ICD-10-CM | POA: Insufficient documentation

## 2020-06-18 DIAGNOSIS — Z5111 Encounter for antineoplastic chemotherapy: Secondary | ICD-10-CM | POA: Insufficient documentation

## 2020-06-18 DIAGNOSIS — C50811 Malignant neoplasm of overlapping sites of right female breast: Secondary | ICD-10-CM | POA: Diagnosis present

## 2020-06-18 DIAGNOSIS — K219 Gastro-esophageal reflux disease without esophagitis: Secondary | ICD-10-CM | POA: Insufficient documentation

## 2020-06-18 DIAGNOSIS — Z79899 Other long term (current) drug therapy: Secondary | ICD-10-CM | POA: Diagnosis not present

## 2020-06-18 DIAGNOSIS — Z170 Estrogen receptor positive status [ER+]: Secondary | ICD-10-CM | POA: Diagnosis not present

## 2020-06-18 DIAGNOSIS — Z7189 Other specified counseling: Secondary | ICD-10-CM

## 2020-06-18 DIAGNOSIS — Z95828 Presence of other vascular implants and grafts: Secondary | ICD-10-CM

## 2020-06-18 LAB — CMP (CANCER CENTER ONLY)
ALT: 31 U/L (ref 0–44)
AST: 23 U/L (ref 15–41)
Albumin: 3.5 g/dL (ref 3.5–5.0)
Alkaline Phosphatase: 169 U/L — ABNORMAL HIGH (ref 38–126)
Anion gap: 5 (ref 5–15)
BUN: 10 mg/dL (ref 8–23)
CO2: 26 mmol/L (ref 22–32)
Calcium: 9.7 mg/dL (ref 8.9–10.3)
Chloride: 106 mmol/L (ref 98–111)
Creatinine: 1.17 mg/dL — ABNORMAL HIGH (ref 0.44–1.00)
GFR, Estimated: 52 mL/min — ABNORMAL LOW (ref >60)
Glucose, Bld: 98 mg/dL (ref 70–99)
Potassium: 4 mmol/L (ref 3.5–5.1)
Sodium: 137 mmol/L (ref 135–145)
Total Bilirubin: 0.3 mg/dL (ref 0.3–1.2)
Total Protein: 6.8 g/dL (ref 6.5–8.1)

## 2020-06-18 LAB — CBC WITH DIFFERENTIAL (CANCER CENTER ONLY)
Abs Immature Granulocytes: 0.01 10*3/uL (ref 0.00–0.07)
Basophils Absolute: 0.1 10*3/uL (ref 0.0–0.1)
Basophils Relative: 1 %
Eosinophils Absolute: 0.4 10*3/uL (ref 0.0–0.5)
Eosinophils Relative: 6 %
HCT: 30.9 % — ABNORMAL LOW (ref 36.0–46.0)
Hemoglobin: 9.9 g/dL — ABNORMAL LOW (ref 12.0–15.0)
Immature Granulocytes: 0 %
Lymphocytes Relative: 30 %
Lymphs Abs: 2 10*3/uL (ref 0.7–4.0)
MCH: 28.8 pg (ref 26.0–34.0)
MCHC: 32 g/dL (ref 30.0–36.0)
MCV: 89.8 fL (ref 80.0–100.0)
Monocytes Absolute: 0.3 10*3/uL (ref 0.1–1.0)
Monocytes Relative: 5 %
Neutro Abs: 3.9 10*3/uL (ref 1.7–7.7)
Neutrophils Relative %: 58 %
Platelet Count: 227 10*3/uL (ref 150–400)
RBC: 3.44 MIL/uL — ABNORMAL LOW (ref 3.87–5.11)
RDW: 14.5 % (ref 11.5–15.5)
WBC Count: 6.6 10*3/uL (ref 4.0–10.5)
nRBC: 0 % (ref 0.0–0.2)

## 2020-06-18 MED ORDER — PROCHLORPERAZINE MALEATE 10 MG PO TABS
ORAL_TABLET | ORAL | Status: AC
  Filled 2020-06-18: qty 1

## 2020-06-18 MED ORDER — SODIUM CHLORIDE 0.9% FLUSH
10.0000 mL | INTRAVENOUS | Status: DC | PRN
  Administered 2020-06-18: 10 mL
  Filled 2020-06-18: qty 10

## 2020-06-18 MED ORDER — ACETAMINOPHEN 325 MG PO TABS
ORAL_TABLET | ORAL | Status: AC
  Filled 2020-06-18: qty 2

## 2020-06-18 MED ORDER — ACETAMINOPHEN 325 MG PO TABS
650.0000 mg | ORAL_TABLET | Freq: Once | ORAL | Status: AC
  Administered 2020-06-18: 650 mg via ORAL

## 2020-06-18 MED ORDER — DIPHENHYDRAMINE HCL 25 MG PO CAPS
25.0000 mg | ORAL_CAPSULE | Freq: Once | ORAL | Status: AC
  Administered 2020-06-18: 25 mg via ORAL

## 2020-06-18 MED ORDER — DIPHENHYDRAMINE HCL 25 MG PO CAPS
ORAL_CAPSULE | ORAL | Status: AC
  Filled 2020-06-18: qty 1

## 2020-06-18 MED ORDER — PERTUZ-TRASTUZ-HYALURON-ZZXF 60-60-2000 MG-MG-U/ML CHEMO ~~LOC~~ SOLN
10.0000 mL | Freq: Once | SUBCUTANEOUS | Status: AC
  Administered 2020-06-18: 10 mL via SUBCUTANEOUS
  Filled 2020-06-18: qty 10

## 2020-06-18 MED ORDER — HEPARIN SOD (PORK) LOCK FLUSH 100 UNIT/ML IV SOLN
500.0000 [IU] | Freq: Once | INTRAVENOUS | Status: AC | PRN
  Administered 2020-06-18: 500 [IU]
  Filled 2020-06-18: qty 5

## 2020-06-18 NOTE — Patient Instructions (Signed)
[place] Discharge Instructions for Patients Receiving Chemotherapy  Today you received the following chemotherapy agents: Phesgo SQ  To help prevent nausea and vomiting after your treatment, we encourage you to take your nausea medication  as prescribed.    If you develop nausea and vomiting that is not controlled by your nausea medication, call the clinic.   BELOW ARE SYMPTOMS THAT SHOULD BE REPORTED IMMEDIATELY:  *FEVER GREATER THAN 100.5 F  *CHILLS WITH OR WITHOUT FEVER  NAUSEA AND VOMITING THAT IS NOT CONTROLLED WITH YOUR NAUSEA MEDICATION  *UNUSUAL SHORTNESS OF BREATH  *UNUSUAL BRUISING OR BLEEDING  TENDERNESS IN MOUTH AND THROAT WITH OR WITHOUT PRESENCE OF ULCERS  *URINARY PROBLEMS  *BOWEL PROBLEMS  UNUSUAL RASH Items with * indicate a potential emergency and should be followed up as soon as possible.  Feel free to call the clinic should you have any questions or concerns. The clinic phone number is [phone].  Please show the [place] at check-in to the Emergency Department and triage nurse.

## 2020-06-18 NOTE — Progress Notes (Signed)
Patient discharged from the [facility] in stable condition and with no signs of acute distress following 15-minute post-injection observation period.

## 2020-06-19 LAB — CANCER ANTIGEN 27.29: CA 27.29: 159 U/mL — ABNORMAL HIGH (ref 0.0–38.6)

## 2020-06-22 MED FILL — AMLODIPINE BESYLATE 5 MG TA: 5 | 30 days supply | Qty: 30 | Fill #2

## 2020-06-22 MED FILL — MONTELUKAST SOD 10 MG TAB: 10 | 30 days supply | Qty: 30 | Fill #1

## 2020-06-22 MED FILL — SYMBICORT 160-4.5 MCG INH: 160-4.5 | 30 days supply | Qty: 10 | Fill #4

## 2020-06-23 ENCOUNTER — Other Ambulatory Visit: Payer: Self-pay | Admitting: Hematology

## 2020-06-23 DIAGNOSIS — Z7189 Other specified counseling: Secondary | ICD-10-CM

## 2020-06-23 DIAGNOSIS — C50919 Malignant neoplasm of unspecified site of unspecified female breast: Secondary | ICD-10-CM

## 2020-06-23 MED FILL — POTASSIUM CHLORIDE CRYS ER: 20 | 30 days supply | Qty: 30 | Fill #1

## 2020-07-04 ENCOUNTER — Other Ambulatory Visit: Payer: Self-pay

## 2020-07-04 DIAGNOSIS — Z95828 Presence of other vascular implants and grafts: Secondary | ICD-10-CM

## 2020-07-04 DIAGNOSIS — C50919 Malignant neoplasm of unspecified site of unspecified female breast: Secondary | ICD-10-CM

## 2020-07-04 MED ORDER — LIDOCAINE HCL 1 % EX GEL: 1.0000 "application " | CUTANEOUS | 2 refills | Status: DC | PRN

## 2020-07-04 MED FILL — LIDOCAINE HCL 2% JELLY: 2 | 30 days supply | Qty: 30 | Fill #0

## 2020-07-04 NOTE — Progress Notes (Signed)
Error

## 2020-07-06 MED FILL — GABAPENTIN 300 MG CAPSULE: 300 | 30 days supply | Qty: 270 | Fill #3

## 2020-07-06 MED FILL — XARELTO 20 MG TABLET: 20 | 30 days supply | Qty: 30 | Fill #2

## 2020-07-07 MED FILL — LOSARTAN POTASSIUM 50 MG TA: 50 | 45 days supply | Qty: 90 | Fill #1

## 2020-07-07 MED FILL — OMEPRAZOLE 40 MG CPDR: 40 | 30 days supply | Qty: 60 | Fill #3

## 2020-07-09 ENCOUNTER — Inpatient Hospital Stay: Payer: Medicaid Other

## 2020-07-09 ENCOUNTER — Other Ambulatory Visit: Payer: Self-pay

## 2020-07-09 VITALS — BP 156/85 | HR 68 | Temp 98.5°F | Resp 18 | Wt 146.5 lb

## 2020-07-09 DIAGNOSIS — Z7189 Other specified counseling: Secondary | ICD-10-CM

## 2020-07-09 DIAGNOSIS — Z95828 Presence of other vascular implants and grafts: Secondary | ICD-10-CM

## 2020-07-09 DIAGNOSIS — C50919 Malignant neoplasm of unspecified site of unspecified female breast: Secondary | ICD-10-CM

## 2020-07-09 DIAGNOSIS — Z5111 Encounter for antineoplastic chemotherapy: Secondary | ICD-10-CM | POA: Diagnosis not present

## 2020-07-09 LAB — CMP (CANCER CENTER ONLY)
ALT: 17 U/L (ref 0–44)
AST: 25 U/L (ref 15–41)
Albumin: 3.7 g/dL (ref 3.5–5.0)
Alkaline Phosphatase: 144 U/L — ABNORMAL HIGH (ref 38–126)
Anion gap: 9 (ref 5–15)
BUN: 15 mg/dL (ref 8–23)
CO2: 22 mmol/L (ref 22–32)
Calcium: 9.4 mg/dL (ref 8.9–10.3)
Chloride: 107 mmol/L (ref 98–111)
Creatinine: 1.17 mg/dL — ABNORMAL HIGH (ref 0.44–1.00)
GFR, Estimated: 52 mL/min — ABNORMAL LOW (ref >60)
Glucose, Bld: 89 mg/dL (ref 70–99)
Potassium: 4.2 mmol/L (ref 3.5–5.1)
Sodium: 138 mmol/L (ref 135–145)
Total Bilirubin: 0.3 mg/dL (ref 0.3–1.2)
Total Protein: 7 g/dL (ref 6.5–8.1)

## 2020-07-09 LAB — CBC WITH DIFFERENTIAL (CANCER CENTER ONLY)
Abs Immature Granulocytes: 0.02 10*3/uL (ref 0.00–0.07)
Basophils Absolute: 0.1 10*3/uL (ref 0.0–0.1)
Basophils Relative: 1 %
Eosinophils Absolute: 0.6 10*3/uL — ABNORMAL HIGH (ref 0.0–0.5)
Eosinophils Relative: 8 %
HCT: 30.9 % — ABNORMAL LOW (ref 36.0–46.0)
Hemoglobin: 10 g/dL — ABNORMAL LOW (ref 12.0–15.0)
Immature Granulocytes: 0 %
Lymphocytes Relative: 27 %
Lymphs Abs: 2.1 10*3/uL (ref 0.7–4.0)
MCH: 28.8 pg (ref 26.0–34.0)
MCHC: 32.4 g/dL (ref 30.0–36.0)
MCV: 89 fL (ref 80.0–100.0)
Monocytes Absolute: 0.4 10*3/uL (ref 0.1–1.0)
Monocytes Relative: 5 %
Neutro Abs: 4.5 10*3/uL (ref 1.7–7.7)
Neutrophils Relative %: 59 %
Platelet Count: 233 10*3/uL (ref 150–400)
RBC: 3.47 MIL/uL — ABNORMAL LOW (ref 3.87–5.11)
RDW: 14.3 % (ref 11.5–15.5)
WBC Count: 7.6 10*3/uL (ref 4.0–10.5)
nRBC: 0 % (ref 0.0–0.2)

## 2020-07-09 MED ORDER — DIPHENHYDRAMINE HCL 25 MG PO CAPS
25.0000 mg | ORAL_CAPSULE | Freq: Once | ORAL | Status: AC
  Administered 2020-07-09: 25 mg via ORAL

## 2020-07-09 MED ORDER — DIPHENHYDRAMINE HCL 25 MG PO CAPS
ORAL_CAPSULE | ORAL | Status: AC
Start: 2020-07-09 — End: ?
  Filled 2020-07-09: qty 1

## 2020-07-09 MED ORDER — ACETAMINOPHEN 325 MG PO TABS
ORAL_TABLET | ORAL | Status: AC
  Filled 2020-07-09: qty 2

## 2020-07-09 MED ORDER — SODIUM CHLORIDE 0.9% FLUSH
10.0000 mL | INTRAVENOUS | Status: DC | PRN
  Administered 2020-07-09: 10 mL
  Filled 2020-07-09: qty 10

## 2020-07-09 MED ORDER — PERTUZ-TRASTUZ-HYALURON-ZZXF 60-60-2000 MG-MG-U/ML CHEMO ~~LOC~~ SOLN
10.0000 mL | Freq: Once | SUBCUTANEOUS | Status: AC
  Administered 2020-07-09: 10 mL via SUBCUTANEOUS
  Filled 2020-07-09: qty 10

## 2020-07-09 MED ORDER — HEPARIN SOD (PORK) LOCK FLUSH 100 UNIT/ML IV SOLN
500.0000 [IU] | Freq: Once | INTRAVENOUS | Status: AC | PRN
  Administered 2020-07-09: 500 [IU]
  Filled 2020-07-09: qty 5

## 2020-07-09 MED ORDER — ACETAMINOPHEN 325 MG PO TABS
650.0000 mg | ORAL_TABLET | Freq: Once | ORAL | Status: AC
  Administered 2020-07-09: 650 mg via ORAL

## 2020-07-09 NOTE — Progress Notes (Signed)
Patient stated that she has been having 2 episodes of diarrhea daily. She occasionally will take imodium or lomotil but the episodes usually resolve on their own. She was encouraged to drink more fluids to stay hydrated and to let us know if the diarrhea episodes increase. Cira Rue NP was also made aware.   Patient remained 15 minutes for the post observation period.

## 2020-07-09 NOTE — Patient Instructions (Signed)
[facility] Discharge Instructions for Patients Receiving Chemotherapy  Today you received the following chemotherapy agents: Phesgo  To help prevent nausea and vomiting after your treatment, we encourage you to take your nausea medication as directed   If you develop nausea and vomiting that is not controlled by your nausea medication, call the clinic.   BELOW ARE SYMPTOMS THAT SHOULD BE REPORTED IMMEDIATELY:  *FEVER GREATER THAN 100.5 F  *CHILLS WITH OR WITHOUT FEVER  NAUSEA AND VOMITING THAT IS NOT CONTROLLED WITH YOUR NAUSEA MEDICATION  *UNUSUAL SHORTNESS OF BREATH  *UNUSUAL BRUISING OR BLEEDING  TENDERNESS IN MOUTH AND THROAT WITH OR WITHOUT PRESENCE OF ULCERS  *URINARY PROBLEMS  *BOWEL PROBLEMS  UNUSUAL RASH Items with * indicate a potential emergency and should be followed up as soon as possible.  Feel free to call the clinic should you have any questions or concerns. The clinic phone number is [phone].  Please show the CHEMO ALERT CARD at check-in to the Emergency Department and triage nurse.   

## 2020-07-10 ENCOUNTER — Telehealth: Payer: Self-pay

## 2020-07-10 NOTE — Telephone Encounter (Signed)
We received a letter today from [employer] stating the Teresa Hays's long term disability has been terminated.  They state that they have not received requested medical records.  I have faxed the requested medical records to [phone], attn: Charlett Blake.

## 2020-07-13 MED FILL — DULOXETINE HCL 20 MG CPEP: 20 | 30 days supply | Qty: 60 | Fill #3

## 2020-07-20 MED FILL — MONTELUKAST SOD 10 MG TAB: 10 | 30 days supply | Qty: 30 | Fill #2

## 2020-07-21 ENCOUNTER — Other Ambulatory Visit: Payer: Self-pay | Admitting: Family Medicine

## 2020-07-21 DIAGNOSIS — I10 Essential (primary) hypertension: Secondary | ICD-10-CM

## 2020-07-21 MED FILL — AMLODIPINE BESYLATE 5 MG TA: 5 | 30 days supply | Qty: 30 | Fill #0

## 2020-07-21 MED FILL — POTASSIUM CHLORIDE CRYS ER: 20 | 30 days supply | Qty: 30 | Fill #2

## 2020-07-21 MED FILL — DIPHENOXYLATE-ATROPINE 2.5-: 2.5-0.025 | 7 days supply | Qty: 60 | Fill #1

## 2020-07-23 ENCOUNTER — Telehealth: Payer: Self-pay | Admitting: Primary Care

## 2020-07-23 ENCOUNTER — Telehealth: Payer: Self-pay | Admitting: Pulmonary Disease

## 2020-07-23 NOTE — Telephone Encounter (Signed)
She is overdue for an apt. She needs PFTs and please make her an apt with Dr. Tonia Brooms, first available with him. She may benefit from starting biologic.

## 2020-07-23 NOTE — Telephone Encounter (Signed)
Spoke with the pt and scheduled for 08/06/20 at 9:30 am

## 2020-07-23 NOTE — Telephone Encounter (Signed)
Spoke with the pt  I have scheduled her for PFT tomorrow at 07/24/20- fully vaxxed and aware must bring her covid card  Dr Tonia BroomsWaynetta Sandy seen pt last and rec PFT and ov with you  You have only blocked spots until 08/18/20 and pt does not want to wait this long  Can we use a blocked spot?

## 2020-07-23 NOTE — Telephone Encounter (Signed)
Will send to [facility] for further recs as seen last in Sept.

## 2020-07-23 NOTE — Telephone Encounter (Signed)
PCCM:  Patient should be referred for covid testing.  She is immunocompromised at [place] due to breast cancer history and immunotherapies.  She would be at risk for break through case of covid despite vaccination.   If she is covid positive please refer to [facility] hotline  Josephine Igo, DO [facility] 07/23/2020 11:12 AM

## 2020-07-23 NOTE — Telephone Encounter (Signed)
Called and spoke with pt letting her know the info stated by Dr. Tonia Brooms that she needs to get a covid test done. Pt stated that she was not going to go get a covid test done due to saying that she does not have covid.  Stated to her that this is his recommendations and she still stated that she refused to get a covid test done. Nothing further needed.

## 2020-07-23 NOTE — Telephone Encounter (Signed)
OK to use a blocked slot. appt. Please use the remaining 15 for another f/u appt if needed. Can be open for anything. Looks like the 19th would be the best day to do that Josephine Igo, DO [facility] 07/23/2020 4:38 PM

## 2020-07-23 NOTE — Telephone Encounter (Signed)
Spoke with pt, c/o prod cough with white mucus, runny nose, pnd, wheezing with exertion, sob with exertion.  S/s present X1 week.   Denies fever, chest pain/tightness, headaches, loss of taste/smell.  No known sick contacts. Pt has been vaccinated for covid and flu.   Pt has been taking mucinex bid, albuterol bid, symbicort bid.    Pharmacy: [facility] outpatient.    Dr. Tonia Brooms please advise on recs.  Thanks!

## 2020-07-24 ENCOUNTER — Ambulatory Visit (INDEPENDENT_AMBULATORY_CARE_PROVIDER_SITE_OTHER): Payer: Medicaid Other | Admitting: Pulmonary Disease

## 2020-07-24 ENCOUNTER — Other Ambulatory Visit: Payer: Self-pay

## 2020-07-24 DIAGNOSIS — R0602 Shortness of breath: Secondary | ICD-10-CM | POA: Diagnosis not present

## 2020-07-24 LAB — PULMONARY FUNCTION TEST
DL/VA % pred: 171 %
DL/VA: 7.18 ml/min/mmHg/L
DLCO cor % pred: 99 %
DLCO cor: 19.9 ml/min/mmHg
DLCO unc % pred: 87 %
DLCO unc: 17.46 ml/min/mmHg
FEF 25-75 Post: 1.07 L/sec
FEF 25-75 Pre: 1.09 L/sec
FEF2575-%Change-Post: -2 %
FEF2575-%Pred-Post: 55 %
FEF2575-%Pred-Pre: 56 %
FEV1-%Change-Post: 0 %
FEV1-%Pred-Post: 56 %
FEV1-%Pred-Pre: 56 %
FEV1-Post: 1.13 L
FEV1-Pre: 1.13 L
FEV1FVC-%Change-Post: 6 %
FEV1FVC-%Pred-Pre: 101 %
FEV6-%Change-Post: -5 %
FEV6-%Pred-Post: 54 %
FEV6-%Pred-Pre: 57 %
FEV6-Post: 1.34 L
FEV6-Pre: 1.42 L
FEV6FVC-%Pred-Post: 103 %
FEV6FVC-%Pred-Pre: 103 %
FVC-%Change-Post: -5 %
FVC-%Pred-Post: 52 %
FVC-%Pred-Pre: 55 %
FVC-Post: 1.34 L
FVC-Pre: 1.42 L
Post FEV1/FVC ratio: 84 %
Post FEV6/FVC ratio: 100 %
Pre FEV1/FVC ratio: 80 %
Pre FEV6/FVC Ratio: 100 %
RV % pred: 138 %
RV: 2.86 L
TLC % pred: 88 %
TLC: 4.46 L

## 2020-07-24 NOTE — Progress Notes (Signed)
Full PFT performed today. °

## 2020-07-25 ENCOUNTER — Telehealth: Payer: Self-pay | Admitting: Pulmonary Disease

## 2020-07-25 MED ORDER — PREDNISONE 10 MG PO TABS: ORAL_TABLET | ORAL | 0 refills | Status: DC

## 2020-07-25 NOTE — Telephone Encounter (Signed)
PCCM:  She probably needs a covid test if she hasnt had it.  Ok to start prednisone taper 40mg  X 4, 30mg , 4 days, 20mg  X 4 days, 10mg  X4 days then stop   Garner Nash, DO [facility] 07/25/2020 5:03 PM

## 2020-07-25 NOTE — Telephone Encounter (Signed)
Called and spoke to pt. Pt c/o wheezing, prod cough with clear mucus, chest congestion x 1 week. Pt denies sinus congestion, CP/tightness, f/c/s and SOB. Pt is taking mucinex with no help. Pt is requesting recs today. Pt last seen on 04/02/2020 for asthma and advised to follow up in 3 months. Pt had PFT on 07/24/2020 and has upcoming follow up with Dr. Valeta Harms on 08/06/20.  Dr. Valeta Harms please advise. Thanks.

## 2020-07-25 NOTE — Telephone Encounter (Signed)
I called and spoke with the pt and notified of response per Dr Valeta Harms  Pt verbalized understanding  Rx was sent to pharm

## 2020-07-28 ENCOUNTER — Encounter (HOSPITAL_COMMUNITY): Payer: Self-pay

## 2020-07-28 ENCOUNTER — Other Ambulatory Visit: Payer: Self-pay | Admitting: Hematology

## 2020-07-28 ENCOUNTER — Ambulatory Visit (HOSPITAL_COMMUNITY)
Admission: RE | Admit: 2020-07-28 | Discharge: 2020-07-28 | Disposition: A | Discharge status: Home / Self Care | Payer: Medicaid Other | Source: Ambulatory Visit | Attending: Hematology | Admitting: Hematology

## 2020-07-28 ENCOUNTER — Other Ambulatory Visit: Payer: Self-pay

## 2020-07-28 DIAGNOSIS — C50911 Malignant neoplasm of unspecified site of right female breast: Secondary | ICD-10-CM | POA: Diagnosis not present

## 2020-07-28 DIAGNOSIS — C50919 Malignant neoplasm of unspecified site of unspecified female breast: Secondary | ICD-10-CM | POA: Diagnosis not present

## 2020-07-28 DIAGNOSIS — C787 Secondary malignant neoplasm of liver and intrahepatic bile duct: Secondary | ICD-10-CM | POA: Diagnosis not present

## 2020-07-28 MED ORDER — HEPARIN SOD (PORK) LOCK FLUSH 100 UNIT/ML IV SOLN
INTRAVENOUS | Status: AC
  Filled 2020-07-28: qty 5

## 2020-07-28 MED ORDER — HEPARIN SOD (PORK) LOCK FLUSH 100 UNIT/ML IV SOLN
500.0000 [IU] | Freq: Once | INTRAVENOUS | Status: AC
  Administered 2020-07-28: 500 [IU] via INTRAVENOUS

## 2020-07-28 MED ORDER — IOHEXOL 300 MG/ML  SOLN
100.0000 mL | Freq: Once | INTRAMUSCULAR | Status: AC | PRN
  Administered 2020-07-28: 100 mL via INTRAVENOUS

## 2020-07-28 MED FILL — LETROZOLE 2.5 MG TABLET: 2.5 | 90 days supply | Qty: 90 | Fill #0

## 2020-07-28 NOTE — Progress Notes (Signed)
[place]   [phone] [phone]   Clinic Follow up Note   Patient Care Team: [name], DO as PCP - General (Family Medicine) [name], MD as Consulting Physician (Gastroenterology) [name], MD as Consulting Physician (Hematology)  Date of Service:  07/30/2020  CHIEF COMPLAINT: F/u for metastatic breast cancer  SUMMARY OF ONCOLOGIC HISTORY: Oncology History Overview Note  Cancer Staging Metastatic breast cancer [facility]) Staging form: Breast, AJCC 8th Edition - Clinical stage from 03/24/2018: Stage IV (cT2, cN1, pM1, G3, ER+, PR+, HER2+) - Signed by [name], MD on 03/30/2018     Metastatic breast cancer ([place])  01/30/2018 Procedure   Colonoscopy showed small polyp in the sigmoid colon, removed, the exam of colon including the terminal ileum was otherwise negative.   01/30/2018 Procedure   EGD by Dr. Collene Mares showed small hiatal hernia, a 8 mm polypoid lesion in the cardia, biopsied.   03/09/2018 Imaging   03/09/2018 US Abdomen IMPRESSION: 1. Mass lesions throughout the liver, consistent with metastatic disease. Liver as a somewhat nodular contour suggesting underlying hepatic cirrhosis. Inhomogeneous echotexture to the liver.  2. Cholelithiasis with mild gallbladder wall thickening. A degree of cholecystitis cannot be excluded by ultrasound.  3. Portions of pancreas obscured by gas. Visualized portions of pancreas appear normal.  4. Small right kidney. Etiology uncertain. This finding potentially may be indicative of renal artery stenosis. In this regard, question whether patient is hypertensive.   03/15/2018 Imaging   CT CAP with contrast  IMPRESSION: 1. Widespread hepatic metastasis. 2. 2.6 cm lateral right breast soft tissue nodule could represent a breast primary or an incidental benign lesion. Consider correlation with mammogram and ultrasound. 3. No definite source of primary malignancy identified within the abdomen or  pelvis. There is possible rectosigmoid junction wall thickening. Consider colonoscopy with attention to this area. 4. Distal esophageal wall thickening, suggesting esophagitis.   03/24/2018 Cancer Staging   Staging form: Breast, AJCC 8th Edition - Clinical stage from 03/24/2018: Stage IV (cT2, cN1, pM1, G3, ER+, PR+, HER2+) - Signed by [name], MD on 03/30/2018   03/24/2018 Initial Biopsy   Diagnosis 1. 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 Breast, right, needle core biopsy, 9 o'clock, 7cm from nipple - INVASIVE DUCTAL CARCINOMA. -The carcinoma is somewhat morphologically dissimilar from that in part 1. It appears grade III 3. Lymph node, needle/core biopsy, right axillary - METASTATIC CARCINOMA IN 1 OF 1 LYMPH NODE (1/1).   03/24/2018 Receptors her2   Breast biopsy: 1. Estrogen Receptor: 40%, POSITIVE, STRONG-MODERATE STAINING INTENSITY Progesterone Receptor: 70%, POSITIVE, STRONG STAINING INTENSITY Proliferation Marker Ki67: 20% HER 2 equivocal by IHC 2+, POSITIVE by FISH, ratio 2.4 and copy #4.2  2. Estrogen Receptor: 60%, POSITIVE, MODERATE STAINING INTENSITY Progesterone Receptor: 40%, POSITIVE, MODERATE STAINING INTENSITY Proliferation Marker Ki67: 20% HER2 (+) by IHC 3+   03/24/2018 Initial Diagnosis   Metastatic breast cancer ([place])   03/27/2018 Pathology Results   Diagnosis Liver, needle/core biopsy, Right - METASTATIC CARCINOMA TO LIVER, CONSISTENT WITH PATIENTS CLINICAL HISTORY OF PRIMARY BREAST CARCINOMA.  ER 80%+ PR40%+ HER2- (by [facility], IHC 2+)  Ki67 50%    03/28/2018 Pathology Results   03/28/2018 Surgical Pathology Diagnosis 1. Breast, left, needle core biopsy, 9 o'clock - FIBROCYSTIC CHANGES. - THERE IS NO EVIDENCE OF MALIGNANCY. 2. Breast, left, needle core biopsy, 2 o'clock - FIBROADENOMA. - THERE IS NO EVIDENCE OF  MALIGNANCY. - SEE COMMENT.   03/29/2018 Imaging    03/29/2018 Bone Scan IMPRESSION: No scintigraphic evidence of osseous metastatic disease.   03/30/2018 Imaging   Bone scan  IMPRESSION: No scintigraphic evidence of osseous metastatic disease.    04/07/2018 - 07/30/2020 Chemotherapy   First line chemo weekly Taxol and herceptin/Perjeta every 3 weeks starting 04/07/18. She developed infusion reaction to taxol and it was discontinued. Added Abraxane on C1D8, 2 weeks on/1 week off.  Abraxne stopped after 10/13/18 due to worsening Neuropathy. She has continued with maintenance Herceptin injection/Perjeta q3weeks. Herceptin changed to injection on 04/06/19. Both injections switched to combination Phesgo on 10/01/19. ----Stopped 07/30/20 due to disease progression in liver.    06/06/2018 Imaging   CT CAP IMPRESSION: 1. Generally improved appearance, with reduced axillary adenopathy and reduced enhancing component of the hepatic masses, with some of the hepatic mass is moderately smaller than on the prior exam. Reduced size of the right lateral breast mass compared to the prior 03/15/2018 exam. 2. New mild interstitial accentuation in the lungs, significance uncertain. Part of this appearance may be due to lower lung volumes on today's exam. 3. Mild wall thickening in the descending colon and upper rectum suggesting low-grade colitis/inflammation. Prominent stool throughout the colon favors constipation. 4. Other imaging findings of potential clinical significance: Aortic Atherosclerosis (ICD10-I70.0). Mild cardiomegaly. Mild nodularity in the right lower lobe appears stable. Contracted and thick-walled gallbladder.    09/18/2018 Imaging   CT CAP W Contrast 09/18/18  IMPRESSION: 1. Liver metastases have decreased in size. 2. Right breast mass, mild right axillary lymphadenopathy and scattered tiny right pulmonary nodules are all stable. 3. New mild left supraclavicular and left subpectoral lymphadenopathy, can not exclude progression of metastatic  nodal disease. 4. Moderate colorectal stool volume, which may indicate constipation. 5.  Aortic Atherosclerosis (ICD10-I70.0).   10/2018 - 07/30/2020 Anti-estrogen oral therapy   Letrozole 2.5 mg daily starting 10/2018 D/c to proceed with Inhertu treatment after liver met progression.    01/10/2019 Imaging   CT CAP W Contrast 01/10/19  IMPRESSION: 1. Continued improvement in the hepatic metastatic lesions which have reduced in size. 2. Stable mild left supraclavicular and subpectoral adenopathy. 3. Essentially stable small right lower lobe pulmonary nodule and separate small subpleural nodule along the right hemidiaphragm. Surveillance suggested. 4. Other imaging findings of potential clinical significance: Mild cardiomegaly. Mild circumferential distal esophageal wall thickening, the most common cause would be esophagitis. Airway thickening is present, suggesting bronchitis or reactive airways disease. Airway plugging in the lower lobes and in the right middle lobe. Stable small right adrenal adenoma.   05/15/2019 Imaging   CT CAP W Contrast 05/15/19  IMPRESSION: CT CHEST IMPRESSION   1. Similar appearance of borderline supraclavicular, axillary, and subpectoral adenopathy. 2. Improved and resolved right lower lobe pulmonary nodularity. 3. Esophageal air fluid level suggests dysmotility or gastroesophageal reflux.   CT ABDOMEN AND PELVIS IMPRESSION   1. Improved hepatic metastasis. 2. Small bowel mesenteric lymph nodes which are upper normal and mildly enlarged. Likely increased and similar as detailed above. Indeterminate. Recommend attention on follow-up. 3. Cholelithiasis. 4. Motion degradation throughout the lower chest and abdomen.   09/19/2019 Imaging   CT CAP W contrast  IMPRESSION: 1. Interval decrease in size of right axillary and subpectoral lymph nodes. There are no pathologically enlarged lymph nodes remaining in the chest, abdomen, or pelvis. No new  lymphadenopathy. 2. No significant change in post treatment appearance of multiple low-attenuation liver lesions, in keeping with treated metastases.  3. No evidence of new metastatic disease in the chest, abdomen, or pelvis. 4. Aortic Atherosclerosis (ICD10-I70.0).   11/16/2019 Imaging   IMRI Brain  MPRESSION: Minimal chronic microvascular ischemic changes. No acute intracranial process.   Active bilateral maxillary sinus disease with mild frontoethmoid mucosal thickening.   01/23/2020 Imaging   CT CAP W contrast  IMPRESSION: 1. Stable exam. No new or progressive interval findings. 2. Stable appearance of upper normal right axillary lymph nodes. Tiny subpectoral and left axillary nodes are unchanged. 3. Generally similar appearance of ill-defined hypoattenuating lesions in the liver compatible with treated metastases. 1 lesion in the central liver is minimally more conspicuous today, likely related to bolus timing and attention on follow-up recommended. No new suspicious liver lesion on today's study. 4. Stable 10 mm right adrenal nodule., indeterminate. Continued attention on follow-up imaging recommended. 5. Cholelithiasis. 6. Aortic Atherosclerosis (ICD10-I70.0).   02/11/2020 Breast MRI   IMPRESSION: 1. 7 millimeter focus of residual enhancement associated with the known malignancy in the 9 o'clock location of the RIGHT breast. 2. No significant enhancement in the known malignancy in the 11:30 o'clock location of the RIGHT breast. 3. Interval resolution of axillary adenopathy.   07/28/2020 Imaging   CT CAP  IMPRESSION: 1. Interval progression of hepatic metastases. 2. Stable indeterminate right adrenal nodule. 3. No new sites of metastatic disease in the chest, abdomen or pelvis. 4. Chronic findings include: Small hiatal hernia. Cholelithiasis. Aortic Atherosclerosis (ICD10-I70.0).     07/2020 -  Chemotherapy   Second-line Inhertu q3weeks starting next week.        CURRENT THERAPY:  -Herceptin injection/Perjeta q3weeksmaintenance therapy.Herceptin changed to injectionon9/18/20.Both injections switched to combination Phesgo on 10/01/19. Stopped 07/30/20 due to disease progression in liver.  -Letrozole 2.5 mg daily starting 10/2018 D/c to proceed with Inhertu treatment after liver met progression.  -PENDING Second-line Inhertu q3weeks starting next week.  INTERVAL HISTORY:  Teresa Hays is here for a follow up. She presents to the clinic alone. She notes she feels good. She notes she has a cough for the past 2 weeks. She has had wheezing cough before. Her pulmonologist wants her to have a COVID test today. She notes she rather I speak with her sister who will talk to her daughter about her progression and treatment plan. She is interested in next line treatment.     REVIEW OF SYSTEMS:   Constitutional: Denies fevers, chills or abnormal weight loss Eyes: Denies blurriness of vision Ears, nose, mouth, throat, and face: Denies mucositis or sore throat Respiratory: Denies dyspnea or wheezes (+) cough  Cardiovascular: Denies palpitation, chest discomfort or lower extremity swelling Gastrointestinal:  Denies nausea, heartburn or change in bowel habits Skin: Denies abnormal skin rashes Lymphatics: Denies new lymphadenopathy or easy bruising Neurological:Denies numbness, tingling or new weaknesses Behavioral/Psych: Mood is stable, no new changes  All other systems were reviewed with the patient and are negative.  MEDICAL HISTORY:  Past Medical History:  Diagnosis Date  . GERD (gastroesophageal reflux disease)   . Hypertension   . Personal history of chemotherapy   . rt breast ca with mets to liver dx'd 03/2018    SURGICAL HISTORY: Past Surgical History:  Procedure Laterality Date  . BREAST BIOPSY Right 03/24/2018   CA x3  . BREAST BIOPSY Left 03/28/2018   neg  . BREAST BIOPSY Left 03/30/2018   neg  . COLONOSCOPY    .  ESOPHAGOGASTRODUODENOSCOPY ENDOSCOPY    . IR IMAGING GUIDED PORT INSERTION  04/04/2018  I have reviewed the social history and family history with the patient and they are unchanged from previous note.  ALLERGIES:  has No Known Allergies.  MEDICATIONS:  Current Outpatient Medications  Medication Sig Dispense Refill  . albuterol (VENTOLIN HFA) 108 (90 Base) MCG/ACT inhaler Inhale 1-2 puffs into the lungs every 6 (six) hours as needed for wheezing or shortness of breath.    Marland Kitchen amLODipine (NORVASC) 5 MG tablet TAKE 1 TABLET BY MOUTH DAILY. 30 tablet 2  . budesonide-formoterol (SYMBICORT) 160-4.5 MCG/ACT inhaler Inhale 2 puffs into the lungs 2 (two) times daily. Rinse mouth after each use. 1 Inhaler 6  . carvedilol (COREG) 3.125 MG tablet TAKE 1 TABLET BY MOUTH TWICE DAILY. REPLACES ATENOLOL 180 tablet 3  . diphenoxylate-atropine (LOMOTIL) 2.5-0.025 MG tablet Take 1-2 tablets by mouth 4 (four) times daily as needed for diarrhea or loose stools. 60 tablet 1  . DULoxetine (CYMBALTA) 20 MG capsule Take 2 capsules (40 mg total) by mouth daily. 60 capsule 3  . gabapentin (NEURONTIN) 300 MG capsule May take up to 3 capsules three times daily 270 capsule 3  . letrozole (FEMARA) 2.5 MG tablet TAKE 1 TABLET BY MOUTH ONCE DAILY 90 tablet 1  . lidocaine-prilocaine (EMLA) cream APPLY TO AFFECTED AREA ONCE AS DIRECTED 30 g 3  . losartan (COZAAR) 50 MG tablet Take 2 tablets (100 mg total) by mouth daily. 90 tablet 3  . montelukast (SINGULAIR) 10 MG tablet TAKE 1 TABLET (10 MG TOTAL) BY MOUTH AT BEDTIME. 30 tablet 5  . omeprazole (PRILOSEC) 40 MG capsule TAKE 1 CAPSULE BY MOUTH TWICE DAILY 60 capsule 3  . potassium chloride SA (KLOR-CON) 20 MEQ tablet TAKE 1 TABLET BY MOUTH ONCE DAILY 30 tablet 3  . predniSONE (DELTASONE) 10 MG tablet 4 x 4 days, 3 x 4 days, 2 x 4 days, 1 x 4 days then stop 40 tablet 0  . rivaroxaban (XARELTO) 20 MG TABS tablet TAKE 1 TABLET BY MOUTH DAILY WITH SUPPER 30 tablet 5  .  spironolactone (ALDACTONE) 25 MG tablet TAKE 1 TABLET BY MOUTH ONCE DAILY 90 tablet 3  . traMADol (ULTRAM) 50 MG tablet Take 1 tablet (50 mg total) by mouth every 6 (six) hours as needed for moderate pain or severe pain. 30 tablet 0   Current Facility-Administered Medications  Medication Dose Route Frequency Provider Last Rate Last Admin  . sodium chloride flush (NS) 0.9 % injection 10 mL  10 mL Intracatheter PRN [name], MD   10 mL at 07/30/20 1024   Facility-Administered Medications Ordered in Other Visits  Medication Dose Route Frequency Provider Last Rate Last Admin  . sodium chloride flush (NS) 0.9 % injection 10 mL  10 mL Intracatheter PRN [name], MD   10 mL at 05/28/20 0935    PHYSICAL EXAMINATION: ECOG PERFORMANCE STATUS: 1 - Symptomatic but completely ambulatory  Vitals:   07/30/20 0940  BP: (!) 151/93  Pulse: 68  Resp: 16  Temp: (!) 96.7 F (35.9 C)  SpO2: 97%   Filed Weights   07/30/20 0940  Weight: 146 lb 8 oz (66.5 kg)    Due to COVID19 we will limit examination to appearance. Patient had no complaints.  GENERAL:alert, no distress and comfortable SKIN: skin color normal, no rashes or significant lesions EYES: normal, Conjunctiva are pink and non-injected, sclera clear  NEURO: alert & oriented x 3 with fluent speech   LABORATORY DATA:  I have reviewed the data as listed CBC Latest Ref Rng &  Units 07/30/2020 07/09/2020 06/18/2020  WBC 4.0 - 10.5 K/uL 8.3 7.6 6.6  Hemoglobin 12.0 - 15.0 g/dL 10.1(L) 10.0(L) 9.9(L)  Hematocrit 36.0 - 46.0 % 31.7(L) 30.9(L) 30.9(L)  Platelets 150 - 400 K/uL 225 233 227     CMP Latest Ref Rng & Units 07/30/2020 07/09/2020 06/18/2020  Glucose 70 - 99 mg/dL 94 89 98  BUN 8 - 23 mg/dL 11 15 10   Creatinine 0.44 - 1.00 mg/dL 1.20(H) 1.17(H) 1.17(H)  Sodium 135 - 145 mmol/L 137 138 137  Potassium 3.5 - 5.1 mmol/L 3.4(L) 4.2 4.0  Chloride 98 - 111 mmol/L 104 107 106  CO2 22 - 32 mmol/L 26 22 26   Calcium 8.9 - 10.3 mg/dL 9.8 9.4  9.7  Total Protein 6.5 - 8.1 g/dL 7.1 7.0 6.8  Total Bilirubin 0.3 - 1.2 mg/dL 0.4 0.3 0.3  Alkaline Phos 38 - 126 U/L 152(H) 144(H) 169(H)  AST 15 - 41 U/L 23 25 23   ALT 0 - 44 U/L 17 17 31       RADIOGRAPHIC STUDIES: I have personally reviewed the radiological images as listed and agreed with the findings in the report. No results found.   ASSESSMENT & PLAN:  Teresa Hays is a 65 y.o. female with   1. 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 Liver met was HER2-.  -Given the metastatic disease, her cancer is not curable but still treatable.  -she is on first line maintenance treatment with letrozole, trastuzumab and Perjeta, tolerating very well -I personally reviewed and discussed her CT CAP from 07/28/20 which shows Interval progression of hepatic metastases and No new sites of metastatic disease in the chest, abdomen or pelvis. Her tumor marker Ca 27.29 has continued to increase recently. Overall she has disease progression and I will stop her current regimen -I recommend Second-line treatment with Inhertu which is a conjugate of chemo and anti-HER2 antibody trastuzumab, based on NCCN guideline.  I also discussed the other options, such as TDM1, chemotherapy, other oral anti-HER2 therapy etc.  --Chemotherapy consent: Side effects including but does not not limited to, fatigue, nausea, vomiting, diarrhea, hair loss, neuropathy, pneumonitis, renal and kidney dysfunction, neutropenic fever, needed for blood transfusion, bleeding, were discussed with patient in great detail. She agrees to proceed. -Due to her underlying asthma/COPD, will monitor her pulmonary symptoms closely due to the risk of pneumonitis   -goal is palliative to control disease and prolong her life. I reviewed side effects with her. I gave her handout of this medicine. She agreed to proceed. Plan to start next week.  -Will f/u with her with C2 in 4 weeks.    2.Peripheral neuropathy, grade 2, Right Foot pain,Secondarytochemotherapy -Ipreviouslystropped Abraxane on 10/13/18. -S/p PT and steroid injection her right foot pain is controlled. She still has mild tingling neuropathy in her toes an fingers. She has adequate function and denies falls. I discussed this can take time to resolve and may not fully resolve.  -She will also continue OTC Calcium and Vit D,OralB12, Gabapentin 300mg 3 tabsTID andCymbalta to 40mg , Tramadol every other night for right foot pain. Well controlled.  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 Anemia, Secondary to chemo -Continue monitoring, will consider blood transfusion if hemoglobin less than 8. -Mild and stable.  5Social support, Goal  of Care Discussion -She lives alone, has her sister and niece in [place]. Her daughter lives in a few hours away  -On Ativan PRN due to anxietyabout diagnosis and treatment -She is full code  6. Asthma  -She has been seen by Dr. Valeta Harms who notes this is related to her post nasal drip and her Acid reflux can contribute. -She is onPrilosec2 tabs BID,allergymedicationand nasal spray and inhalers.Her insurance denied Dexilant.  -Continue to f/u withDr Icard and Dr Collene Mares andpulmonary clinic. -This week she notes very mild wheezing. I encouraged her to f/u with Dr Valeta Harms for better control.  -She has recent cough for the past 2 weeks. Dr Valeta Harms plans to have her COVID tested today (07/30/20). She has no other symptoms.   7. Goal of care discussion  -We again discussed the incurable nature of her cancer, and the overall poor prognosis, especially if he/she does not have good response to chemotherapy or progress on  chemo -The patient understands the goal of care is palliative. -she is full code now     Plan: -Scan reviewed, shows disease progression in liver  -stop Phesgo and Letrozole  -Per pt request will discuss her scan and treatment plan with her Sister  -start Enhertu next week  -Lab, flush, F/u and Inhertu in 4 weeks    No problem-specific Assessment & Plan notes found for this encounter.   No orders of the defined types were placed in this encounter.  All questions were answered. The patient knows to call the clinic with any problems, questions or concerns. No barriers to learning was detected. The total time spent in the appointment was 40 minutes.     [name], MD 07/30/2020   I, Joslyn Devon, am acting as scribe for [name], MD.   I have reviewed the above documentation for accuracy and completeness, and I agree with the above.

## 2020-07-30 ENCOUNTER — Inpatient Hospital Stay (HOSPITAL_BASED_OUTPATIENT_CLINIC_OR_DEPARTMENT_OTHER): Payer: Medicaid Other | Admitting: Hematology

## 2020-07-30 ENCOUNTER — Other Ambulatory Visit: Payer: Self-pay

## 2020-07-30 ENCOUNTER — Other Ambulatory Visit: Payer: Self-pay | Admitting: Hematology

## 2020-07-30 ENCOUNTER — Encounter: Payer: Self-pay | Admitting: Hematology

## 2020-07-30 ENCOUNTER — Inpatient Hospital Stay: Payer: Medicaid Other | Attending: Hematology

## 2020-07-30 ENCOUNTER — Inpatient Hospital Stay: Payer: Medicaid Other

## 2020-07-30 VITALS — BP 151/93 | HR 68 | Temp 96.7°F | Resp 16 | Ht 64.0 in | Wt 146.5 lb

## 2020-07-30 DIAGNOSIS — K219 Gastro-esophageal reflux disease without esophagitis: Secondary | ICD-10-CM | POA: Insufficient documentation

## 2020-07-30 DIAGNOSIS — J45909 Unspecified asthma, uncomplicated: Secondary | ICD-10-CM | POA: Insufficient documentation

## 2020-07-30 DIAGNOSIS — K449 Diaphragmatic hernia without obstruction or gangrene: Secondary | ICD-10-CM | POA: Insufficient documentation

## 2020-07-30 DIAGNOSIS — C773 Secondary and unspecified malignant neoplasm of axilla and upper limb lymph nodes: Secondary | ICD-10-CM | POA: Diagnosis not present

## 2020-07-30 DIAGNOSIS — Z171 Estrogen receptor negative status [ER-]: Secondary | ICD-10-CM | POA: Diagnosis not present

## 2020-07-30 DIAGNOSIS — I700 Atherosclerosis of aorta: Secondary | ICD-10-CM | POA: Insufficient documentation

## 2020-07-30 DIAGNOSIS — D6481 Anemia due to antineoplastic chemotherapy: Secondary | ICD-10-CM | POA: Insufficient documentation

## 2020-07-30 DIAGNOSIS — Z95828 Presence of other vascular implants and grafts: Secondary | ICD-10-CM

## 2020-07-30 DIAGNOSIS — G620 Drug-induced polyneuropathy: Secondary | ICD-10-CM | POA: Diagnosis not present

## 2020-07-30 DIAGNOSIS — T451X5A Adverse effect of antineoplastic and immunosuppressive drugs, initial encounter: Secondary | ICD-10-CM | POA: Diagnosis not present

## 2020-07-30 DIAGNOSIS — C50811 Malignant neoplasm of overlapping sites of right female breast: Secondary | ICD-10-CM | POA: Insufficient documentation

## 2020-07-30 DIAGNOSIS — C50919 Malignant neoplasm of unspecified site of unspecified female breast: Secondary | ICD-10-CM

## 2020-07-30 DIAGNOSIS — K8020 Calculus of gallbladder without cholecystitis without obstruction: Secondary | ICD-10-CM | POA: Diagnosis not present

## 2020-07-30 DIAGNOSIS — Z5112 Encounter for antineoplastic immunotherapy: Secondary | ICD-10-CM | POA: Insufficient documentation

## 2020-07-30 DIAGNOSIS — Z79899 Other long term (current) drug therapy: Secondary | ICD-10-CM | POA: Diagnosis not present

## 2020-07-30 DIAGNOSIS — Z20822 Contact with and (suspected) exposure to covid-19: Secondary | ICD-10-CM | POA: Diagnosis not present

## 2020-07-30 DIAGNOSIS — C787 Secondary malignant neoplasm of liver and intrahepatic bile duct: Secondary | ICD-10-CM | POA: Insufficient documentation

## 2020-07-30 LAB — CBC WITH DIFFERENTIAL (CANCER CENTER ONLY)
Abs Immature Granulocytes: 0.02 10*3/uL (ref 0.00–0.07)
Basophils Absolute: 0 10*3/uL (ref 0.0–0.1)
Basophils Relative: 0 %
Eosinophils Absolute: 0.1 10*3/uL (ref 0.0–0.5)
Eosinophils Relative: 1 %
HCT: 31.7 % — ABNORMAL LOW (ref 36.0–46.0)
Hemoglobin: 10.1 g/dL — ABNORMAL LOW (ref 12.0–15.0)
Immature Granulocytes: 0 %
Lymphocytes Relative: 31 %
Lymphs Abs: 2.6 10*3/uL (ref 0.7–4.0)
MCH: 28.1 pg (ref 26.0–34.0)
MCHC: 31.9 g/dL (ref 30.0–36.0)
MCV: 88.1 fL (ref 80.0–100.0)
Monocytes Absolute: 0.6 10*3/uL (ref 0.1–1.0)
Monocytes Relative: 7 %
Neutro Abs: 5.1 10*3/uL (ref 1.7–7.7)
Neutrophils Relative %: 61 %
Platelet Count: 225 10*3/uL (ref 150–400)
RBC: 3.6 MIL/uL — ABNORMAL LOW (ref 3.87–5.11)
RDW: 13.9 % (ref 11.5–15.5)
WBC Count: 8.3 10*3/uL (ref 4.0–10.5)
nRBC: 0 % (ref 0.0–0.2)

## 2020-07-30 LAB — CMP (CANCER CENTER ONLY)
ALT: 17 U/L (ref 0–44)
AST: 23 U/L (ref 15–41)
Albumin: 3.8 g/dL (ref 3.5–5.0)
Alkaline Phosphatase: 152 U/L — ABNORMAL HIGH (ref 38–126)
Anion gap: 7 (ref 5–15)
BUN: 11 mg/dL (ref 8–23)
CO2: 26 mmol/L (ref 22–32)
Calcium: 9.8 mg/dL (ref 8.9–10.3)
Chloride: 104 mmol/L (ref 98–111)
Creatinine: 1.2 mg/dL — ABNORMAL HIGH (ref 0.44–1.00)
GFR, Estimated: 51 mL/min — ABNORMAL LOW (ref >60)
Glucose, Bld: 94 mg/dL (ref 70–99)
Potassium: 3.4 mmol/L — ABNORMAL LOW (ref 3.5–5.1)
Sodium: 137 mmol/L (ref 135–145)
Total Bilirubin: 0.4 mg/dL (ref 0.3–1.2)
Total Protein: 7.1 g/dL (ref 6.5–8.1)

## 2020-07-30 MED ORDER — SODIUM CHLORIDE 0.9% FLUSH
10.0000 mL | INTRAVENOUS | Status: DC | PRN
  Administered 2020-07-30: 10 mL
  Filled 2020-07-30: qty 10

## 2020-07-30 MED ORDER — HEPARIN SOD (PORK) LOCK FLUSH 100 UNIT/ML IV SOLN
500.0000 [IU] | Freq: Once | INTRAVENOUS | Status: AC | PRN
Start: 2020-07-30 — End: 2020-07-30
  Administered 2020-07-30: 500 [IU]
  Filled 2020-07-30: qty 5

## 2020-07-30 NOTE — Addendum Note (Signed)
Addended by: [name] on: 07/30/2020 08:39 PM   Modules accepted: Orders

## 2020-07-30 NOTE — Patient Instructions (Signed)

## 2020-07-30 NOTE — Progress Notes (Signed)
CLINICAL TRIAL SELECTED - Breast  No Change  Continue With Treatment as Ordered.  Original Decision Date/Time: 03/30/2018 23:29  Trial: [id number]  **Trial eligibility and accrual should be confirmed by your research team**  Patient Characteristics: Distant Metastases or Locoregional Recurrent Disease - Unresected, HER2 Positive, ER Positive, Chemotherapy + HER2-Targeted Therapy, First Line Therapeutic Status: Distant Metastases BRCA Mutation Status: Awaiting Test Results ER Status: Positive (+) HER2 Status: Positive (+) PR Status: Positive (+) Line of therapy: First Line Intent of Therapy: Non-Curative / Palliative Intent, Discussed with Patient

## 2020-07-30 NOTE — Progress Notes (Signed)
DISCONTINUE SELECTED CLINICAL TRIAL - Breast  Trial: NRG [id number]  **Trial eligibility and accrual should be confirmed by your research team**  REASON: Disease Progression PRIOR TREATMENT: Trial: [id number] TREATMENT RESPONSE: Partial Response (PR)  START OFF PATHWAY REGIMEN - Breast   [id number]:Fam-trastuzumab deruxtecan-nxki 5.4 mg/kg IV D1 q21 Days:   A cycle is every 21 days:     Fam-trastuzumab deruxtecan-nxki   **Always confirm dose/schedule in your pharmacy ordering system**  Patient Characteristics: Distant Metastases or Locoregional Recurrent Disease - Unresected or Locally Advanced Unresectable Disease Progressing after Neoadjuvant and Local Therapies, HER2 Positive, ER Positive, Chemotherapy + HER2-Targeted Therapy, Second Line Therapeutic Status: Distant Metastases ER Status: Positive (+) HER2 Status: Positive (+) PR Status: Positive (+) Line of Therapy: Second Line Intent of Therapy: Non-Curative / Palliative Intent, Discussed with Patient

## 2020-07-31 ENCOUNTER — Telehealth: Payer: Self-pay | Admitting: Hematology

## 2020-07-31 NOTE — Telephone Encounter (Signed)
Scheduled appts per 1/12 los. Pt confirmed appt dates and times.

## 2020-08-01 ENCOUNTER — Telehealth: Payer: Self-pay | Admitting: *Deleted

## 2020-08-01 NOTE — Telephone Encounter (Signed)
Notified of message below. Verbalized understanding 

## 2020-08-01 NOTE — Telephone Encounter (Signed)
-----   Message from Alla Feeling, NP sent at 07/31/2020  2:31 PM EST ----- Please let her know I recommend to increase hydration and increase K to 1 tab BID x3 days, then back to once daily.  Thanks, Regan Rakers, NP

## 2020-08-01 NOTE — Progress Notes (Signed)
Pharmacist Chemotherapy Monitoring - Initial Assessment    Anticipated start date: 08/08/20   Regimen:  . Are orders appropriate based on the patient's diagnosis, regimen, and cycle? Yes . Does the plan date match the patient's scheduled date? Yes . Is the sequencing of drugs appropriate? Yes . Are the premedications appropriate for the patient's regimen? Yes . Prior Authorization for treatment is: Approved o If applicable, is the correct biosimilar selected based on the patient's insurance? not applicable  Organ Function and Labs: Marland Kitchen Are dose adjustments needed based on the patient's renal function, hepatic function, or hematologic function? No . Are appropriate labs ordered prior to the start of patient's treatment? Yes . Other organ system assessment, if indicated: trastuzumab-deruztecan: Echo/ MUGA . The following baseline labs, if indicated, have been ordered: N/A  Dose Assessment: . Are the drug doses appropriate? Yes . Are the following correct: o Drug concentrations Yes o IV fluid compatible with drug Yes o Administration routes Yes o Timing of therapy Yes . If applicable, does the patient have documented access for treatment and/or plans for port-a-cath placement? yes . If applicable, have lifetime cumulative doses been properly documented and assessed? yes Lifetime Dose Tracking  No doses have been documented on this patient for the following tracked chemicals: Doxorubicin, Epirubicin, Idarubicin, Daunorubicin, Mitoxantrone, Bleomycin, Oxaliplatin, Carboplatin, Liposomal Doxorubicin  o   Toxicity Monitoring/Prevention: . The patient has the following take home antiemetics prescribed: N/A . The patient has the following take home medications prescribed: N/A . Medication allergies and previous infusion related reactions, if applicable, have been reviewed and addressed. Yes . The patient's current medication list has been assessed for drug-drug interactions with their  chemotherapy regimen. no significant drug-drug interactions were identified on review.  Order Review: . Are the treatment plan orders signed? Yes . Is the patient scheduled to see a provider prior to their treatment? No  I verify that I have reviewed each item in the above checklist and answered each question accordingly.   Kennith Center, Pharm.D., CPP 08/01/2020@6 :22 PM

## 2020-08-02 ENCOUNTER — Other Ambulatory Visit: Payer: Self-pay | Admitting: Nurse Practitioner

## 2020-08-02 DIAGNOSIS — K219 Gastro-esophageal reflux disease without esophagitis: Secondary | ICD-10-CM

## 2020-08-05 ENCOUNTER — Other Ambulatory Visit: Payer: Self-pay | Admitting: Nurse Practitioner

## 2020-08-05 MED FILL — OMEPRAZOLE 40 MG CPDR: 40 | 30 days supply | Qty: 60 | Fill #0

## 2020-08-06 ENCOUNTER — Ambulatory Visit: Payer: Medicaid Other | Admitting: Pulmonary Disease

## 2020-08-08 ENCOUNTER — Other Ambulatory Visit: Payer: Self-pay

## 2020-08-08 ENCOUNTER — Other Ambulatory Visit: Payer: Self-pay | Admitting: Hematology

## 2020-08-08 ENCOUNTER — Inpatient Hospital Stay: Payer: Medicaid Other

## 2020-08-08 VITALS — BP 162/92 | HR 72 | Temp 98.7°F | Resp 16 | Wt 148.5 lb

## 2020-08-08 DIAGNOSIS — C50919 Malignant neoplasm of unspecified site of unspecified female breast: Secondary | ICD-10-CM

## 2020-08-08 DIAGNOSIS — Z95828 Presence of other vascular implants and grafts: Secondary | ICD-10-CM

## 2020-08-08 DIAGNOSIS — Z7189 Other specified counseling: Secondary | ICD-10-CM

## 2020-08-08 DIAGNOSIS — Z5112 Encounter for antineoplastic immunotherapy: Secondary | ICD-10-CM | POA: Diagnosis not present

## 2020-08-08 DIAGNOSIS — R110 Nausea: Secondary | ICD-10-CM

## 2020-08-08 DIAGNOSIS — N3000 Acute cystitis without hematuria: Secondary | ICD-10-CM

## 2020-08-08 LAB — CBC WITH DIFFERENTIAL (CANCER CENTER ONLY)
Abs Immature Granulocytes: 0.04 10*3/uL (ref 0.00–0.07)
Basophils Absolute: 0 10*3/uL (ref 0.0–0.1)
Basophils Relative: 0 %
Eosinophils Absolute: 0.1 10*3/uL (ref 0.0–0.5)
Eosinophils Relative: 1 %
HCT: 32.5 % — ABNORMAL LOW (ref 36.0–46.0)
Hemoglobin: 10.4 g/dL — ABNORMAL LOW (ref 12.0–15.0)
Immature Granulocytes: 0 %
Lymphocytes Relative: 28 %
Lymphs Abs: 2.8 10*3/uL (ref 0.7–4.0)
MCH: 28.5 pg (ref 26.0–34.0)
MCHC: 32 g/dL (ref 30.0–36.0)
MCV: 89 fL (ref 80.0–100.0)
Monocytes Absolute: 0.6 10*3/uL (ref 0.1–1.0)
Monocytes Relative: 6 %
Neutro Abs: 6.5 10*3/uL (ref 1.7–7.7)
Neutrophils Relative %: 65 %
Platelet Count: 218 10*3/uL (ref 150–400)
RBC: 3.65 MIL/uL — ABNORMAL LOW (ref 3.87–5.11)
RDW: 14.6 % (ref 11.5–15.5)
WBC Count: 10 10*3/uL (ref 4.0–10.5)
nRBC: 0 % (ref 0.0–0.2)

## 2020-08-08 LAB — CMP (CANCER CENTER ONLY)
ALT: 35 U/L (ref 0–44)
AST: 25 U/L (ref 15–41)
Albumin: 3.6 g/dL (ref 3.5–5.0)
Alkaline Phosphatase: 149 U/L — ABNORMAL HIGH (ref 38–126)
Anion gap: 10 (ref 5–15)
BUN: 14 mg/dL (ref 8–23)
CO2: 25 mmol/L (ref 22–32)
Calcium: 9.2 mg/dL (ref 8.9–10.3)
Chloride: 102 mmol/L (ref 98–111)
Creatinine: 1.28 mg/dL — ABNORMAL HIGH (ref 0.44–1.00)
GFR, Estimated: 47 mL/min — ABNORMAL LOW (ref >60)
Glucose, Bld: 93 mg/dL (ref 70–99)
Potassium: 3.9 mmol/L (ref 3.5–5.1)
Sodium: 137 mmol/L (ref 135–145)
Total Bilirubin: 0.4 mg/dL (ref 0.3–1.2)
Total Protein: 6.6 g/dL (ref 6.5–8.1)

## 2020-08-08 LAB — URINALYSIS, COMPLETE (UACMP) WITH MICROSCOPIC
Bilirubin Urine: NEGATIVE
Glucose, UA: NEGATIVE mg/dL
Hgb urine dipstick: NEGATIVE
Ketones, ur: NEGATIVE mg/dL
Leukocytes,Ua: NEGATIVE
Nitrite: NEGATIVE
Protein, ur: NEGATIVE mg/dL
Specific Gravity, Urine: 1.002 — ABNORMAL LOW (ref 1.005–1.030)
pH: 7 (ref 5.0–8.0)

## 2020-08-08 MED ORDER — SODIUM CHLORIDE 0.9 % IV SOLN
10.0000 mg | Freq: Once | INTRAVENOUS | Status: AC
  Administered 2020-08-08: 10 mg via INTRAVENOUS
  Filled 2020-08-08: qty 10

## 2020-08-08 MED ORDER — HEPARIN SOD (PORK) LOCK FLUSH 100 UNIT/ML IV SOLN
500.0000 [IU] | Freq: Once | INTRAVENOUS | Status: AC | PRN
  Administered 2020-08-08: 500 [IU]
  Filled 2020-08-08: qty 5

## 2020-08-08 MED ORDER — SODIUM CHLORIDE 0.9% FLUSH
10.0000 mL | INTRAVENOUS | Status: DC | PRN
  Administered 2020-08-08: 10 mL
  Filled 2020-08-08: qty 10

## 2020-08-08 MED ORDER — DIPHENHYDRAMINE HCL 25 MG PO CAPS
50.0000 mg | ORAL_CAPSULE | Freq: Once | ORAL | Status: AC
  Administered 2020-08-08: 50 mg via ORAL

## 2020-08-08 MED ORDER — ACETAMINOPHEN 325 MG PO TABS
650.0000 mg | ORAL_TABLET | Freq: Once | ORAL | Status: AC
  Administered 2020-08-08: 650 mg via ORAL

## 2020-08-08 MED ORDER — DEXTROSE 5 % IV SOLN
Freq: Once | INTRAVENOUS | Status: AC
Start: 2020-08-08 — End: 2020-08-08
  Filled 2020-08-08: qty 250

## 2020-08-08 MED ORDER — FAM-TRASTUZUMAB DERUXTECAN-NXKI CHEMO 100 MG IV SOLR
5.4000 mg/kg | Freq: Once | INTRAVENOUS | Status: AC
  Administered 2020-08-08: 360 mg via INTRAVENOUS
  Filled 2020-08-08: qty 18

## 2020-08-08 MED ORDER — ACETAMINOPHEN 325 MG PO TABS
ORAL_TABLET | ORAL | Status: AC
  Filled 2020-08-08: qty 2

## 2020-08-08 MED ORDER — PALONOSETRON HCL INJECTION 0.25 MG/5ML
INTRAVENOUS | Status: AC
  Filled 2020-08-08: qty 5

## 2020-08-08 MED ORDER — PROCHLORPERAZINE MALEATE 10 MG PO TABS: 10.0000 mg | ORAL_TABLET | Freq: Four times a day (QID) | ORAL | 3 refills | Status: DC | PRN

## 2020-08-08 MED ORDER — PALONOSETRON HCL INJECTION 0.25 MG/5ML
0.2500 mg | Freq: Once | INTRAVENOUS | Status: AC
  Administered 2020-08-08: 0.25 mg via INTRAVENOUS

## 2020-08-08 MED ORDER — DIPHENHYDRAMINE HCL 25 MG PO CAPS
ORAL_CAPSULE | ORAL | Status: AC
  Filled 2020-08-08: qty 2

## 2020-08-08 MED ORDER — ONDANSETRON HCL 8 MG PO TABS: 8.0000 mg | ORAL_TABLET | Freq: Three times a day (TID) | ORAL | 3 refills | Status: DC | PRN

## 2020-08-08 MED FILL — PROCHLORPERAZINE 10 MG TAB: 10 | 7 days supply | Qty: 30 | Fill #0

## 2020-08-08 MED FILL — ONDANSETRON HCL 8 MG TABLET: 8 | 10 days supply | Qty: 30 | Fill #0

## 2020-08-08 NOTE — Patient Instructions (Signed)
[facility] Discharge Instructions for Patients Receiving Chemotherapy  Today you received the following chemotherapy agents: Enhertu  To help prevent nausea and vomiting after your treatment, we encourage you to take your nausea medication as directed.   If you develop nausea and vomiting that is not controlled by your nausea medication, call the clinic.   BELOW ARE SYMPTOMS THAT SHOULD BE REPORTED IMMEDIATELY:  *FEVER GREATER THAN 100.5 F  *CHILLS WITH OR WITHOUT FEVER  NAUSEA AND VOMITING THAT IS NOT CONTROLLED WITH YOUR NAUSEA MEDICATION  *UNUSUAL SHORTNESS OF BREATH  *UNUSUAL BRUISING OR BLEEDING  TENDERNESS IN MOUTH AND THROAT WITH OR WITHOUT PRESENCE OF ULCERS  *URINARY PROBLEMS  *BOWEL PROBLEMS  UNUSUAL RASH Items with * indicate a potential emergency and should be followed up as soon as possible.  Feel free to call the clinic should you have any questions or concerns. The clinic phone number is [phone].  Please show the CHEMO ALERT CARD at check-in to the Emergency Department and triage nurse.   

## 2020-08-09 LAB — URINE CULTURE: Culture: NO GROWTH

## 2020-08-09 LAB — CANCER ANTIGEN 27.29: CA 27.29: 419.9 U/mL — ABNORMAL HIGH (ref 0.0–38.6)

## 2020-08-11 ENCOUNTER — Telehealth: Payer: Self-pay

## 2020-08-11 NOTE — Telephone Encounter (Signed)
Pt made aware of UA results

## 2020-08-11 NOTE — Telephone Encounter (Signed)
-----   Message from [name], MD sent at 08/11/2020  7:54 AM EST ----- Please let pt know her urine culture has been negative, thanks   [name]

## 2020-08-13 NOTE — Progress Notes (Signed)
[name]   [phone] [phone]   Clinic Follow up Note   Patient Care Team: [name], DO as PCP - General (Family Medicine) [name], MD as Consulting Physician (Gastroenterology) [name], MD as Consulting Physician (Hematology)   I connected with Teresa Hays on 08/14/2020 at 12:00 PM EST by telephone visit and verified that I am speaking with the correct person using two identifiers.  I discussed the limitations, risks, security and privacy concerns of performing an evaluation and management service by telephone and the availability of in person appointments. I also discussed with the patient that there may be a patient responsible charge related to this service. The patient expressed understanding and agreed to proceed.   Other persons participating in the visit and their role in the encounter:  Pt's niece   Patient's location:  Car  Provider's location:  Office   CHIEF COMPLAINT: F/u for metastatic breast cancer  SUMMARY OF ONCOLOGIC HISTORY: Oncology History Overview Note  Cancer Staging Metastatic breast cancer ([place]) Staging form: Breast, AJCC 8th Edition - Clinical stage from 03/24/2018: Stage IV (cT2, cN1, pM1, G3, ER+, PR+, HER2+) - Signed by [name], MD on 03/30/2018     Metastatic breast cancer ([place])  01/30/2018 Procedure   Colonoscopy showed small polyp in the sigmoid colon, removed, the exam of colon including the terminal ileum was otherwise negative.   01/30/2018 Procedure   EGD by Dr. Collene Mares showed small hiatal hernia, a 8 mm polypoid lesion in the cardia, biopsied.   03/09/2018 Imaging   03/09/2018 US Abdomen IMPRESSION: 1. Mass lesions throughout the liver, consistent with metastatic disease. Liver as a somewhat nodular contour suggesting underlying hepatic cirrhosis. Inhomogeneous echotexture to the liver.  2. Cholelithiasis with mild gallbladder wall thickening. A degree of cholecystitis cannot  be excluded by ultrasound.  3. Portions of pancreas obscured by gas. Visualized portions of pancreas appear normal.  4. Small right kidney. Etiology uncertain. This finding potentially may be indicative of renal artery stenosis. In this regard, question whether patient is hypertensive.   03/15/2018 Imaging   CT CAP with contrast  IMPRESSION: 1. Widespread hepatic metastasis. 2. 2.6 cm lateral right breast soft tissue nodule could represent a breast primary or an incidental benign lesion. Consider correlation with mammogram and ultrasound. 3. No definite source of primary malignancy identified within the abdomen or pelvis. There is possible rectosigmoid junction wall thickening. Consider colonoscopy with attention to this area. 4. Distal esophageal wall thickening, suggesting esophagitis.   03/24/2018 Cancer Staging   Staging form: Breast, AJCC 8th Edition - Clinical stage from 03/24/2018: Stage IV (cT2, cN1, pM1, G3, ER+, PR+, HER2+) - Signed by [name], MD on 03/30/2018   03/24/2018 Initial Biopsy   Diagnosis 1. 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 Breast, right, needle core biopsy, 9 o'clock, 7cm from nipple - INVASIVE DUCTAL CARCINOMA. -The carcinoma is somewhat morphologically dissimilar from that in part 1. It appears grade III 3. Lymph node, needle/core biopsy, right axillary - METASTATIC CARCINOMA IN 1 OF 1 LYMPH NODE (1/1).   03/24/2018 Receptors her2   Breast biopsy: 1. Estrogen Receptor: 40%, POSITIVE, STRONG-MODERATE STAINING INTENSITY Progesterone Receptor: 70%, POSITIVE, STRONG STAINING INTENSITY Proliferation Marker Ki67: 20% HER 2 equivocal by IHC 2+, POSITIVE by FISH, ratio 2.4 and copy #4.2  2. Estrogen Receptor: 60%, POSITIVE, MODERATE STAINING INTENSITY Progesterone Receptor: 40%, POSITIVE, MODERATE STAINING  INTENSITY Proliferation Marker Ki67: 20% HER2 (+) by IHC  3+   03/24/2018 Initial Diagnosis   Metastatic breast cancer ([place])   03/27/2018 Pathology Results   Diagnosis Liver, needle/core biopsy, Right - METASTATIC CARCINOMA TO LIVER, CONSISTENT WITH PATIENTS CLINICAL HISTORY OF PRIMARY BREAST CARCINOMA.  ER 80%+ PR40%+ HER2- (by [facility], IHC 2+)  Ki67 50%    03/28/2018 Pathology Results   03/28/2018 Surgical Pathology Diagnosis 1. Breast, left, needle core biopsy, 9 o'clock - FIBROCYSTIC CHANGES. - THERE IS NO EVIDENCE OF MALIGNANCY. 2. Breast, left, needle core biopsy, 2 o'clock - FIBROADENOMA. - THERE IS NO EVIDENCE OF MALIGNANCY. - SEE COMMENT.   03/29/2018 Imaging   03/29/2018 Bone Scan IMPRESSION: No scintigraphic evidence of osseous metastatic disease.   03/30/2018 Imaging   Bone scan  IMPRESSION: No scintigraphic evidence of osseous metastatic disease.    04/07/2018 - 07/30/2020 Chemotherapy   First line chemo weekly Taxol and herceptin/Perjeta every 3 weeks starting 04/07/18. She developed infusion reaction to taxol and it was discontinued. Added Abraxane on C1D8, 2 weeks on/1 week off.  Abraxne stopped after 10/13/18 due to worsening Neuropathy. She has continued with maintenance Herceptin injection/Perjeta q3weeks. Herceptin changed to injection on 04/06/19. Both injections switched to combination [place] on 10/01/19. ----Stopped 07/30/20 due to disease progression in liver.    06/06/2018 Imaging   CT CAP IMPRESSION: 1. Generally improved appearance, with reduced axillary adenopathy and reduced enhancing component of the hepatic masses, with some of the hepatic mass is moderately smaller than on the prior exam. Reduced size of the right lateral breast mass compared to the prior 03/15/2018 exam. 2. New mild interstitial accentuation in the lungs, significance uncertain. Part of this appearance may be due to lower lung volumes on today's exam. 3. Mild wall thickening in the descending colon and upper rectum suggesting low-grade  colitis/inflammation. Prominent stool throughout the colon favors constipation. 4. Other imaging findings of potential clinical significance: Aortic Atherosclerosis (ICD10-I70.0). Mild cardiomegaly. Mild nodularity in the right lower lobe appears stable. Contracted and thick-walled gallbladder.    09/18/2018 Imaging   CT CAP W Contrast 09/18/18  IMPRESSION: 1. Liver metastases have decreased in size. 2. Right breast mass, mild right axillary lymphadenopathy and scattered tiny right pulmonary nodules are all stable. 3. New mild left supraclavicular and left subpectoral lymphadenopathy, can not exclude progression of metastatic nodal disease. 4. Moderate colorectal stool volume, which may indicate constipation. 5.  Aortic Atherosclerosis (ICD10-I70.0).   10/2018 - 07/30/2020 Anti-estrogen oral therapy   Letrozole 2.5 mg daily starting 10/2018 D/c to proceed with Inhertu treatment after liver met progression.    01/10/2019 Imaging   CT CAP W Contrast 01/10/19  IMPRESSION: 1. Continued improvement in the hepatic metastatic lesions which have reduced in size. 2. Stable mild left supraclavicular and subpectoral adenopathy. 3. Essentially stable small right lower lobe pulmonary nodule and separate small subpleural nodule along the right hemidiaphragm. Surveillance suggested. 4. Other imaging findings of potential clinical significance: Mild cardiomegaly. Mild circumferential distal esophageal wall thickening, the most common cause would be esophagitis. Airway thickening is present, suggesting bronchitis or reactive airways disease. Airway plugging in the lower lobes and in the right middle lobe. Stable small right adrenal adenoma.   05/15/2019 Imaging   CT CAP W Contrast 05/15/19  IMPRESSION: CT CHEST IMPRESSION   1. Similar appearance of borderline supraclavicular, axillary, and subpectoral adenopathy. 2. Improved and resolved right lower lobe pulmonary nodularity. 3. Esophageal air  fluid level suggests dysmotility or gastroesophageal reflux.   CT  ABDOMEN AND PELVIS IMPRESSION   1. Improved hepatic metastasis. 2. Small bowel mesenteric lymph nodes which are upper normal and mildly enlarged. Likely increased and similar as detailed above. Indeterminate. Recommend attention on follow-up. 3. Cholelithiasis. 4. Motion degradation throughout the lower chest and abdomen.   09/19/2019 Imaging   CT CAP W contrast  IMPRESSION: 1. Interval decrease in size of right axillary and subpectoral lymph nodes. There are no pathologically enlarged lymph nodes remaining in the chest, abdomen, or pelvis. No new lymphadenopathy. 2. No significant change in post treatment appearance of multiple low-attenuation liver lesions, in keeping with treated metastases. 3. No evidence of new metastatic disease in the chest, abdomen, or pelvis. 4. Aortic Atherosclerosis (ICD10-I70.0).   11/16/2019 Imaging   IMRI Brain  MPRESSION: Minimal chronic microvascular ischemic changes. No acute intracranial process.   Active bilateral maxillary sinus disease with mild frontoethmoid mucosal thickening.   01/23/2020 Imaging   CT CAP W contrast  IMPRESSION: 1. Stable exam. No new or progressive interval findings. 2. Stable appearance of upper normal right axillary lymph nodes. Tiny subpectoral and left axillary nodes are unchanged. 3. Generally similar appearance of ill-defined hypoattenuating lesions in the liver compatible with treated metastases. 1 lesion in the central liver is minimally more conspicuous today, likely related to bolus timing and attention on follow-up recommended. No new suspicious liver lesion on today's study. 4. Stable 10 mm right adrenal nodule., indeterminate. Continued attention on follow-up imaging recommended. 5. Cholelithiasis. 6. Aortic Atherosclerosis (ICD10-I70.0).   02/11/2020 Breast MRI   IMPRESSION: 1. 7 millimeter focus of residual enhancement associated with  the known malignancy in the 9 o'clock location of the RIGHT breast. 2. No significant enhancement in the known malignancy in the 11:30 o'clock location of the RIGHT breast. 3. Interval resolution of axillary adenopathy.   07/28/2020 Imaging   CT CAP  IMPRESSION: 1. Interval progression of hepatic metastases. 2. Stable indeterminate right adrenal nodule. 3. No new sites of metastatic disease in the chest, abdomen or pelvis. 4. Chronic findings include: Small hiatal hernia. Cholelithiasis. Aortic Atherosclerosis (ICD10-I70.0).     08/08/2020 -  Chemotherapy   Second-line Inhertu q3weeks starting 08/08/20       CURRENT THERAPY:  Second-line Enhertu q3weeks starting 08/08/20   INTERVAL HISTORY:  ZULLY FRANE is scheduled for virtual visit today, for toxicity checkup after first cycle of Enhertu therapy 1 week ago.  She did well in the first few days after infusion, however developed intermittent nausea, vomiting and diarrhea since day 3.  She has not been able to eat much.  She did take nausea medication which helped.  She has not tried any Imodium, but diarrhea resolved today.  She denies any fever, abdominal cramping, bleeding, or other new symptoms.  She is quite fatigued.  She is on her way home from out of town with her niece.   All other systems were reviewed with the patient and are negative.  MEDICAL HISTORY:  Past Medical History:  Diagnosis Date  . GERD (gastroesophageal reflux disease)   . Hypertension   . Personal history of chemotherapy   . rt breast ca with mets to liver dx'd 03/2018    SURGICAL HISTORY: Past Surgical History:  Procedure Laterality Date  . BREAST BIOPSY Right 03/24/2018   CA x3  . BREAST BIOPSY Left 03/28/2018   neg  . BREAST BIOPSY Left 03/30/2018   neg  . COLONOSCOPY    . ESOPHAGOGASTRODUODENOSCOPY ENDOSCOPY    . IR IMAGING GUIDED PORT INSERTION  04/04/2018    I have reviewed the social history and family history with the patient  and they are unchanged from previous note.  ALLERGIES:  has No Known Allergies.  MEDICATIONS:  Current Outpatient Medications  Medication Sig Dispense Refill  . albuterol (VENTOLIN HFA) 108 (90 Base) MCG/ACT inhaler Inhale 1-2 puffs into the lungs every 6 (six) hours as needed for wheezing or shortness of breath.    Marland Kitchen amLODipine (NORVASC) 5 MG tablet TAKE 1 TABLET BY MOUTH DAILY. 30 tablet 2  . budesonide-formoterol (SYMBICORT) 160-4.5 MCG/ACT inhaler Inhale 2 puffs into the lungs 2 (two) times daily. Rinse mouth after each use. 1 Inhaler 6  . carvedilol (COREG) 3.125 MG tablet TAKE 1 TABLET BY MOUTH TWICE DAILY. REPLACES ATENOLOL 180 tablet 3  . diphenoxylate-atropine (LOMOTIL) 2.5-0.025 MG tablet Take 1-2 tablets by mouth 4 (four) times daily as needed for diarrhea or loose stools. 60 tablet 1  . DULoxetine (CYMBALTA) 20 MG capsule Take 2 capsules (40 mg total) by mouth daily. 60 capsule 3  . gabapentin (NEURONTIN) 300 MG capsule May take up to 3 capsules three times daily 270 capsule 3  . lidocaine (XYLOCAINE) 2 % jelly Apply 1 application topically as needed.    Marland Kitchen losartan (COZAAR) 50 MG tablet Take 2 tablets (100 mg total) by mouth daily. 90 tablet 3  . montelukast (SINGULAIR) 10 MG tablet TAKE 1 TABLET (10 MG TOTAL) BY MOUTH AT BEDTIME. 30 tablet 5  . omeprazole (PRILOSEC) 40 MG capsule TAKE 1 CAPSULE BY MOUTH TWICE DAILY 60 capsule 3  . ondansetron (ZOFRAN) 8 MG tablet Take 1 tablet (8 mg total) by mouth every 8 (eight) hours as needed for nausea or vomiting. 30 tablet 3  . potassium chloride SA (KLOR-CON) 20 MEQ tablet TAKE 1 TABLET BY MOUTH ONCE DAILY 30 tablet 3  . predniSONE (DELTASONE) 10 MG tablet 4 x 4 days, 3 x 4 days, 2 x 4 days, 1 x 4 days then stop 40 tablet 0  . prochlorperazine (COMPAZINE) 10 MG tablet Take 1 tablet (10 mg total) by mouth every 6 (six) hours as needed for nausea or vomiting. 30 tablet 3  . rivaroxaban (XARELTO) 20 MG TABS tablet TAKE 1 TABLET BY MOUTH DAILY  WITH SUPPER 30 tablet 5  . spironolactone (ALDACTONE) 25 MG tablet TAKE 1 TABLET BY MOUTH ONCE DAILY 90 tablet 3  . traMADol (ULTRAM) 50 MG tablet Take 1 tablet (50 mg total) by mouth every 6 (six) hours as needed for moderate pain or severe pain. 30 tablet 0   No current facility-administered medications for this visit.   Facility-Administered Medications Ordered in Other Visits  Medication Dose Route Frequency Provider Last Rate Last Admin  . sodium chloride flush (NS) 0.9 % injection 10 mL  10 mL Intracatheter PRN Malachy Mood, MD   10 mL at 05/28/20 0935    PHYSICAL EXAMINATION: ECOG PERFORMANCE STATUS: 3 - Symptomatic, >50% confined to bed  No vitals taken today, Exam not performed today   LABORATORY DATA:  I have reviewed the data as listed CBC Latest Ref Rng & Units 08/08/2020 07/30/2020 07/09/2020  WBC 4.0 - 10.5 K/uL 10.0 8.3 7.6  Hemoglobin 12.0 - 15.0 g/dL 10.4(L) 10.1(L) 10.0(L)  Hematocrit 36.0 - 46.0 % 32.5(L) 31.7(L) 30.9(L)  Platelets 150 - 400 K/uL 218 225 233     CMP Latest Ref Rng & Units 08/08/2020 07/30/2020 07/09/2020  Glucose 70 - 99 mg/dL 93 94 89  BUN 8 - 23 mg/dL 14  11 15  Creatinine 0.44 - 1.00 mg/dL 1.28(H) 1.20(H) 1.17(H)  Sodium 135 - 145 mmol/L 137 137 138  Potassium 3.5 - 5.1 mmol/L 3.9 3.4(L) 4.2  Chloride 98 - 111 mmol/L 102 104 107  CO2 22 - 32 mmol/L 25 26 22   Calcium 8.9 - 10.3 mg/dL 9.2 9.8 9.4  Total Protein 6.5 - 8.1 g/dL 6.6 7.1 7.0  Total Bilirubin 0.3 - 1.2 mg/dL 0.4 0.4 0.3  Alkaline Phos 38 - 126 U/L 149(H) 152(H) 144(H)  AST 15 - 41 U/L 25 23 25   ALT 0 - 44 U/L 35 17 17      RADIOGRAPHIC STUDIES: I have personally reviewed the radiological images as listed and agreed with the findings in the report. No results found.   ASSESSMENT & PLAN:  Teresa Hays is a 65 y.o. female with    1. 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 Liver met was HER2-.  -Given the metastatic disease, her cancer is not curable but still treatable.  -She had been treated with first line maintenance treatment with letrozole, trastuzumab and Perjeta, until recent disease progression on 07/28/20 CT CAP.  -I started her on second-line Inhertu q3weeks starting 08/08/20. Goal is palliative to control disease and prolong her life. -she developed intermittent nausea, diarrhea, poor appetite after first cycle enhertu  -will arrange lab and IVF tomorrow  -likely need dose reduction with cycle 2, and supportive care    2.Peripheral neuropathy, grade 2, Right Foot pain,Secondarytochemotherapy -Ipreviouslystropped Abraxane on 10/13/18. -S/p PT and steroid injection her right foot pain is controlled. She still has mild tingling neuropathy in her toes an fingers. She has adequate function and denies falls. I discussed this can take time to resolve and may not fully resolve. -She will also continue OTC Calcium and Vit D,OralB12, Gabapentin 300mg 3 tabsTID andCymbalta to 40mg , Tramadol every other night for right foot pain.Well controlled. 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 Anemia, Secondary to chemo -Continue monitoring, will consider blood transfusion if hemoglobin less than 8. -Mild and stable.  5Social support, Goal of Care Discussion -She lives alone, has her sister and niece in [place]. Her daughter lives in a few hours away  -On Ativan PRN due to anxietyabout diagnosis and treatment -She is full code  6. Asthma  -She has been seen by Dr. Valeta Harms who notes this is related to her post nasal drip and her Acid reflux can contribute. -She is onPrilosec2 tabs BID,allergymedicationand nasal spray and  inhalers.Her insurance denied Dexilant.  -Continue to f/u withDr Icard and Dr Collene Mares andpulmonary clinic. -This week she notes very mild wheezing. I encouraged her to f/u with Dr Valeta Harms for better control. -She has recent cough for the past 2 weeks. She was to be COVID tested on 07/30/20. She has no other symptoms.   7. Goal of care discussion  -We discussed the incurable nature of her cancer, and the overall poor prognosis, especially if she does not have good response to chemotherapy or progress on chemo -The patient understands the goal of care is palliative. -she is full code now   8. Nausea, diarrhea and anorexia -Secondary to [facility] -We will schedule her for IV fluids tomorrow and check lab  Plan: -IVF 2hrs tomorrow with zofran/dexa  if needed, I will check on her tomorrow  -lab, f/u and cycle 2 Enhertu in 2 weeks, will reduce dose    No problem-specific Assessment & Plan notes found for this encounter.   No orders of the defined types were placed in this encounter.  I discussed the assessment and treatment plan with the patient. The patient was provided an opportunity to ask questions and all were answered. The patient agreed with the plan and demonstrated an understanding of the instructions.  The patient was advised to call back or seek an in-person evaluation if the symptoms worsen or if the condition fails to improve as anticipated.  The total time spent in the appointment was 25 minutes.    [name], MD 08/14/2020   I, Joslyn Devon, am acting as scribe for [name], MD.   I have reviewed the above documentation for accuracy and completeness, and I agree with the above.

## 2020-08-14 ENCOUNTER — Encounter: Payer: Self-pay | Admitting: Hematology

## 2020-08-14 ENCOUNTER — Telehealth: Payer: Self-pay

## 2020-08-14 ENCOUNTER — Inpatient Hospital Stay (HOSPITAL_BASED_OUTPATIENT_CLINIC_OR_DEPARTMENT_OTHER): Payer: Medicaid Other | Admitting: Hematology

## 2020-08-14 ENCOUNTER — Other Ambulatory Visit: Payer: Self-pay | Admitting: Hematology

## 2020-08-14 DIAGNOSIS — C50919 Malignant neoplasm of unspecified site of unspecified female breast: Secondary | ICD-10-CM

## 2020-08-14 DIAGNOSIS — G620 Drug-induced polyneuropathy: Secondary | ICD-10-CM

## 2020-08-14 DIAGNOSIS — T451X5A Adverse effect of antineoplastic and immunosuppressive drugs, initial encounter: Secondary | ICD-10-CM

## 2020-08-14 MED FILL — CARVEDILOL 3.125 MG TABLET: 3.125 | 90 days supply | Qty: 180 | Fill #2

## 2020-08-14 MED FILL — GABAPENTIN 300 MG CAPSULE: 300 | 30 days supply | Qty: 270 | Fill #0

## 2020-08-14 NOTE — Telephone Encounter (Signed)
I spoke with Ms Teresa Hays and relayed appt times for tomorrow.  She verbalized understanding.

## 2020-08-15 ENCOUNTER — Other Ambulatory Visit: Payer: Self-pay | Admitting: Hematology

## 2020-08-15 ENCOUNTER — Inpatient Hospital Stay: Payer: Medicaid Other

## 2020-08-15 ENCOUNTER — Other Ambulatory Visit: Payer: Self-pay

## 2020-08-15 VITALS — BP 114/80 | HR 73 | Temp 98.2°F | Resp 18 | Wt 141.8 lb

## 2020-08-15 DIAGNOSIS — Z95828 Presence of other vascular implants and grafts: Secondary | ICD-10-CM

## 2020-08-15 DIAGNOSIS — C50919 Malignant neoplasm of unspecified site of unspecified female breast: Secondary | ICD-10-CM

## 2020-08-15 DIAGNOSIS — Z5112 Encounter for antineoplastic immunotherapy: Secondary | ICD-10-CM | POA: Diagnosis not present

## 2020-08-15 LAB — CMP (CANCER CENTER ONLY)
ALT: 44 U/L (ref 0–44)
AST: 29 U/L (ref 15–41)
Albumin: 3.5 g/dL (ref 3.5–5.0)
Alkaline Phosphatase: 171 U/L — ABNORMAL HIGH (ref 38–126)
Anion gap: 7 (ref 5–15)
BUN: 20 mg/dL (ref 8–23)
CO2: 24 mmol/L (ref 22–32)
Calcium: 9 mg/dL (ref 8.9–10.3)
Chloride: 97 mmol/L — ABNORMAL LOW (ref 98–111)
Creatinine: 1.77 mg/dL — ABNORMAL HIGH (ref 0.44–1.00)
GFR, Estimated: 32 mL/min — ABNORMAL LOW (ref >60)
Glucose, Bld: 126 mg/dL — ABNORMAL HIGH (ref 70–99)
Potassium: 5 mmol/L (ref 3.5–5.1)
Sodium: 128 mmol/L — ABNORMAL LOW (ref 135–145)
Total Bilirubin: 0.6 mg/dL (ref 0.3–1.2)
Total Protein: 6.8 g/dL (ref 6.5–8.1)

## 2020-08-15 LAB — CBC WITH DIFFERENTIAL (CANCER CENTER ONLY)
Abs Immature Granulocytes: 0.03 10*3/uL (ref 0.00–0.07)
Basophils Absolute: 0 10*3/uL (ref 0.0–0.1)
Basophils Relative: 0 %
Eosinophils Absolute: 0.2 10*3/uL (ref 0.0–0.5)
Eosinophils Relative: 3 %
HCT: 33.9 % — ABNORMAL LOW (ref 36.0–46.0)
Hemoglobin: 11 g/dL — ABNORMAL LOW (ref 12.0–15.0)
Immature Granulocytes: 0 %
Lymphocytes Relative: 29 %
Lymphs Abs: 2.2 10*3/uL (ref 0.7–4.0)
MCH: 28.5 pg (ref 26.0–34.0)
MCHC: 32.4 g/dL (ref 30.0–36.0)
MCV: 87.8 fL (ref 80.0–100.0)
Monocytes Absolute: 0.2 10*3/uL (ref 0.1–1.0)
Monocytes Relative: 2 %
Neutro Abs: 4.7 10*3/uL (ref 1.7–7.7)
Neutrophils Relative %: 66 %
Platelet Count: 157 10*3/uL (ref 150–400)
RBC: 3.86 MIL/uL — ABNORMAL LOW (ref 3.87–5.11)
RDW: 13.8 % (ref 11.5–15.5)
WBC Count: 7.3 10*3/uL (ref 4.0–10.5)
nRBC: 0 % (ref 0.0–0.2)

## 2020-08-15 MED ORDER — SODIUM CHLORIDE 0.9% FLUSH
10.0000 mL | INTRAVENOUS | Status: DC | PRN
  Administered 2020-08-15: 10 mL
  Filled 2020-08-15: qty 10

## 2020-08-15 MED ORDER — HEPARIN SOD (PORK) LOCK FLUSH 100 UNIT/ML IV SOLN
500.0000 [IU] | Freq: Once | INTRAVENOUS | Status: AC | PRN
  Administered 2020-08-15: 500 [IU]
  Filled 2020-08-15: qty 5

## 2020-08-15 MED ORDER — SODIUM CHLORIDE 0.9 % IV SOLN
Freq: Once | INTRAVENOUS | Status: AC
Start: 2020-08-15 — End: 2020-08-15
  Filled 2020-08-15: qty 250

## 2020-08-15 MED ORDER — ALTEPLASE 2 MG IJ SOLR
2.0000 mg | Freq: Once | INTRAMUSCULAR | Status: DC | PRN
  Filled 2020-08-15: qty 2

## 2020-08-15 MED FILL — DULOXETINE HCL 20 MG CPEP: 20 | 30 days supply | Qty: 60 | Fill #0

## 2020-08-15 NOTE — Patient Instructions (Signed)
Rehydration, Adult Rehydration is the replacement of body fluids, salts, and minerals (electrolytes) that are lost during dehydration. Dehydration is when there is not enough water or other fluids in the body. This happens when you lose more fluids than you take in. Common causes of dehydration include:  Not drinking enough fluids. This can occur when you are ill or doing activities that require a lot of energy, especially in hot weather.  Conditions that cause loss of water or other fluids, such as diarrhea, vomiting, sweating, or urinating a lot.  Other illnesses, such as fever or infection.  Certain medicines, such as those that remove excess fluid from the body (diuretics). Symptoms of mild or moderate dehydration may include thirst, dry lips and mouth, and dizziness. Symptoms of severe dehydration may include increased heart rate, confusion, fainting, and not urinating. For severe dehydration, you may need to get fluids through an IV at the hospital. For mild or moderate dehydration, you can usually rehydrate at home by drinking certain fluids as told by your health care provider. What are the risks? Generally, rehydration is safe. However, taking in too much fluid (overhydration) can be a problem. This is rare. Overhydration can cause an electrolyte imbalance, kidney failure, or a decrease in salt (sodium) levels in the body. Supplies needed You will need an oral rehydration solution (ORS) if your health care provider tells you to use one. This is a drink to treat dehydration. It can be found in pharmacies and retail stores. How to rehydrate Fluids Follow instructions from your health care provider for rehydration. The kind of fluid and the amount you should drink depend on your condition. In general, you should choose drinks that you prefer.  If told by your health care provider, drink an ORS. ? Make an ORS by following instructions on the package. ? Start by drinking small amounts,  about  cup (120 mL) every 5-10 minutes. ? Slowly increase how much you drink until you have taken the amount recommended by your health care provider.  Drink enough clear fluids to keep your urine pale yellow. If you were told to drink an ORS, finish it first, then start slowly drinking other clear fluids. Drink fluids such as: ? Water. This includes sparkling water and flavored water. Drinking only water can lead to having too little sodium in your body (hyponatremia). Follow the advice of your health care provider. ? Water from ice chips you suck on. ? Fruit juice with water you add to it (diluted). ? Sports drinks. ? Hot or cold herbal teas. ? Broth-based soups. ? Milk or milk products. Food Follow instructions from your health care provider about what to eat while you rehydrate. Your health care provider may recommend that you slowly begin eating regular foods in small amounts.  Eat foods that contain a healthy balance of electrolytes, such as bananas, oranges, potatoes, tomatoes, and spinach.  Avoid foods that are greasy or contain a lot of sugar. In some cases, you may get nutrition through a feeding tube that is passed through your nose and into your stomach (nasogastric tube, or NG tube). This may be done if you have uncontrolled vomiting or diarrhea.   Beverages to avoid Certain beverages may make dehydration worse. While you rehydrate, avoid drinking alcohol.   How to tell if you are recovering from dehydration You may be recovering from dehydration if:  You are urinating more often than before you started rehydrating.  Your urine is pale yellow.  Your energy level   improves.  You vomit less frequently.  You have diarrhea less frequently.  Your appetite improves or returns to normal.  You feel less dizzy or less light-headed.  Your skin tone and color start to look more normal. Follow these instructions at home:  Take over-the-counter and prescription medicines only  as told by your health care provider.  Do not take sodium tablets. Doing this can lead to having too much sodium in your body (hypernatremia). Contact a health care provider if:  You continue to have symptoms of mild or moderate dehydration, such as: ? Thirst. ? Dry lips. ? Slightly dry mouth. ? Dizziness. ? Dark urine or less urine than normal. ? Muscle cramps.  You continue to vomit or have diarrhea. Get help right away if you:  Have symptoms of dehydration that get worse.  Have a fever.  Have a severe headache.  Have been vomiting and the following happens: ? Your vomiting gets worse or does not go away. ? Your vomit includes blood or green matter (bile). ? You cannot eat or drink without vomiting.  Have problems with urination or bowel movements, such as: ? Diarrhea that gets worse or does not go away. ? Blood in your stool (feces). This may cause stool to look black and tarry. ? Not urinating, or urinating only a small amount of very dark urine, within 6-8 hours.  Have trouble breathing.  Have symptoms that get worse with treatment. These symptoms may represent a serious problem that is an emergency. Do not wait to see if the symptoms will go away. Get medical help right away. Call your local emergency services (911 in the [place] Do not drive yourself to the hospital. Summary  Rehydration is the replacement of body fluids and minerals (electrolytes) that are lost during dehydration.  Follow instructions from your health care provider for rehydration. The kind of fluid and amount you should drink depend on your condition.  Slowly increase how much you drink until you have taken the amount recommended by your health care provider.  Contact your health care provider if you continue to show signs of mild or moderate dehydration. This information is not intended to replace advice given to you by your health care provider. Make sure you discuss any questions you have with  your health care provider. Document Revised: 09/05/2019 Document Reviewed: 07/16/2019 Elsevier Patient Education  2021 [employer]  

## 2020-08-15 NOTE — Patient Instructions (Signed)

## 2020-08-16 MED FILL — LIDOCAINE HCL 2% JELLY: 2 | 30 days supply | Qty: 30 | Fill #1

## 2020-08-16 MED FILL — SYMBICORT 160-4.5 MCG INH: 160-4.5 | 30 days supply | Qty: 10 | Fill #5

## 2020-08-16 MED FILL — XARELTO 20 MG TABLET: 20 | 30 days supply | Qty: 30 | Fill #3

## 2020-08-18 ENCOUNTER — Inpatient Hospital Stay: Payer: Medicaid Other

## 2020-08-18 ENCOUNTER — Other Ambulatory Visit: Payer: Self-pay

## 2020-08-18 VITALS — BP 116/78 | HR 65 | Temp 98.3°F | Resp 18

## 2020-08-18 DIAGNOSIS — Z5112 Encounter for antineoplastic immunotherapy: Secondary | ICD-10-CM | POA: Diagnosis not present

## 2020-08-18 DIAGNOSIS — Z95828 Presence of other vascular implants and grafts: Secondary | ICD-10-CM

## 2020-08-18 MED ORDER — SODIUM CHLORIDE 0.9 % IV SOLN
Freq: Once | INTRAVENOUS | Status: AC
Start: 2020-08-18 — End: 2020-08-18
  Filled 2020-08-18: qty 250

## 2020-08-18 MED ORDER — HEPARIN SOD (PORK) LOCK FLUSH 100 UNIT/ML IV SOLN
500.0000 [IU] | Freq: Once | INTRAVENOUS | Status: AC | PRN
  Administered 2020-08-18: 500 [IU]
  Filled 2020-08-18: qty 5

## 2020-08-18 MED ORDER — SODIUM CHLORIDE 0.9% FLUSH
10.0000 mL | Freq: Once | INTRAVENOUS | Status: AC | PRN
  Administered 2020-08-18: 10 mL
  Filled 2020-08-18: qty 10

## 2020-08-18 MED ORDER — DEXAMETHASONE SODIUM PHOSPHATE 10 MG/ML IJ SOLN
8.0000 mg | Freq: Once | INTRAMUSCULAR | Status: AC
  Administered 2020-08-18: 8 mg via INTRAVENOUS

## 2020-08-18 MED ORDER — ONDANSETRON HCL 4 MG/2ML IJ SOLN
8.0000 mg | Freq: Once | INTRAMUSCULAR | Status: AC
  Administered 2020-08-18: 8 mg via INTRAVENOUS

## 2020-08-18 MED ORDER — DEXAMETHASONE SODIUM PHOSPHATE 10 MG/ML IJ SOLN
INTRAMUSCULAR | Status: AC
  Filled 2020-08-18: qty 1

## 2020-08-18 MED ORDER — SODIUM CHLORIDE 0.9 % IV SOLN: Freq: Once | INTRAVENOUS | Status: DC

## 2020-08-18 MED ORDER — ONDANSETRON HCL 4 MG/2ML IJ SOLN
INTRAMUSCULAR | Status: AC
  Filled 2020-08-18: qty 4

## 2020-08-18 MED FILL — AMLODIPINE BESYLATE 5 MG TA: 5 | 30 days supply | Qty: 30 | Fill #1

## 2020-08-18 MED FILL — LOSARTAN POTASSIUM 50 MG TA: 50 | 45 days supply | Qty: 90 | Fill #2

## 2020-08-18 MED FILL — POTASSIUM CHLORIDE CRYS ER: 20 | 30 days supply | Qty: 30 | Fill #3

## 2020-08-18 NOTE — Patient Instructions (Signed)
Dehydration, Adult Dehydration is a condition in which there is not enough water or other fluids in the body. This happens when a person loses more fluids than he or she takes in. Important organs, such as the kidneys, brain, and heart, cannot function without a proper amount of fluids. Any loss of fluids from the body can lead to dehydration. Dehydration can be mild, moderate, or severe. It should be treated right away to prevent it from becoming severe. What are the causes? Dehydration may be caused by:  Conditions that cause loss of water or other fluids, such as diarrhea, vomiting, or sweating or urinating a lot.  Not drinking enough fluids, especially when you are ill or doing activities that require a lot of energy.  Other illnesses and conditions, such as fever or infection.  Certain medicines, such as medicines that remove excess fluid from the body (diuretics).  Lack of safe drinking water.  Not being able to get enough water and food. What increases the risk? The following factors may make you more likely to develop this condition:  Having a long-term (chronic) illness that has not been treated properly, such as diabetes, heart disease, or kidney disease.  Being 65 years of age or older.  Having a disability.  Living in a place that is high in altitude, where thinner, drier air causes more fluid loss.  Doing exercises that put stress on your body for a long time (endurance sports). What are the signs or symptoms? Symptoms of dehydration depend on how severe it is. Mild or moderate dehydration  Thirst.  Dry lips or dry mouth.  Dizziness or light-headedness, especially when standing up from a seated position.  Muscle cramps.  Dark urine. Urine may be the color of tea.  Less urine or tears produced than usual.  Headache. Severe dehydration  Changes in skin. Your skin may be cold and clammy, blotchy, or pale. Your skin also may not return to normal after being  lightly pinched and released.  Little or no tears, urine, or sweat.  Changes in vital signs, such as rapid breathing and low blood pressure. Your pulse may be weak or may be faster than 100 beats a minute when you are sitting still.  Other changes, such as: ? Feeling very thirsty. ? Sunken eyes. ? Cold hands and feet. ? Confusion. ? Being very tired (lethargic) or having trouble waking from sleep. ? Short-term weight loss. ? Loss of consciousness. How is this diagnosed? This condition is diagnosed based on your symptoms and a physical exam. You may have blood and urine tests to help confirm the diagnosis. How is this treated? Treatment for this condition depends on how severe it is. Treatment should be started right away. Do not wait until dehydration becomes severe. Severe dehydration is an emergency and needs to be treated in a hospital.  Mild or moderate dehydration can be treated at home. You may be asked to: ? Drink more fluids. ? Drink an oral rehydration solution (ORS). This drink helps restore proper amounts of fluids and salts and minerals in the blood (electrolytes).  Severe dehydration can be treated: ? With IV fluids. ? By correcting abnormal levels of electrolytes. This is often done by giving electrolytes through a tube that is passed through your nose and into your stomach (nasogastric tube, or NG tube). ? By treating the underlying cause of dehydration. Follow these instructions at home: Oral rehydration solution If told by your health care provider, drink an ORS:  Make   an ORS by following instructions on the package.  Start by drinking small amounts, about  cup (120 mL) every 5-10 minutes.  Slowly increase how much you drink until you have taken the amount recommended by your health care provider. Eating and drinking  Drink enough clear fluid to keep your urine pale yellow. If you were told to drink an ORS, finish the ORS first and then start slowly drinking  other clear fluids. Drink fluids such as: ? Water. Do not drink only water. Doing that can lead to hyponatremia, which is having too little salt (sodium) in the body. ? Water from ice chips you suck on. ? Fruit juice that you have added water to (diluted fruit juice). ? Low-calorie sports drinks.  Eat foods that contain a healthy balance of electrolytes, such as bananas, oranges, potatoes, tomatoes, and spinach.  Do not drink alcohol.  Avoid the following: ? Drinks that contain a lot of sugar. These include high-calorie sports drinks, fruit juice that is not diluted, and soda. ? Caffeine. ? Foods that are greasy or contain a lot of fat or sugar.         General instructions  Take over-the-counter and prescription medicines only as told by your health care provider.  Do not take sodium tablets. Doing that can lead to having too much sodium in the body (hypernatremia).  Return to your normal activities as told by your health care provider. Ask your health care provider what activities are safe for you.  Keep all follow-up visits as told by your health care provider. This is important. Contact a health care provider if:  You have muscle cramps, pain, or discomfort, such as: ? Pain in your abdomen and the pain gets worse or stays in one area (localizes). ? Stiff neck.  You have a rash.  You are more irritable than usual.  You are sleepier or have a harder time waking than usual.  You feel weak or dizzy.  You feel very thirsty. Get help right away if you have:  Any symptoms of severe dehydration.  Symptoms of vomiting, such as: ? You cannot eat or drink without vomiting. ? Vomiting gets worse or does not go away. ? Vomit includes blood or green matter (bile).  Symptoms that get worse with treatment.  A fever.  A severe headache.  Problems with urination or bowel movements, such as: ? Diarrhea that gets worse or does not go away. ? Blood in your stool (feces).  This may cause stool to look black and tarry. ? Not urinating, or urinating only a small amount of very dark urine, within 6-8 hours.  Trouble breathing. These symptoms may represent a serious problem that is an emergency. Do not wait to see if the symptoms will go away. Get medical help right away. Call your local emergency services (911 in the [place] Do not drive yourself to the hospital. Summary  Dehydration is a condition in which there is not enough water or other fluids in the body. This happens when a person loses more fluids than he or she takes in.  Treatment for this condition depends on how severe it is. Treatment should be started right away. Do not wait until dehydration becomes severe.  Drink enough clear fluid to keep your urine pale yellow. If you were told to drink an oral rehydration solution (ORS), finish the ORS first and then start slowly drinking other clear fluids.  Take over-the-counter and prescription medicines only as told by your health   care provider.  Get help right away if you have any symptoms of severe dehydration. This information is not intended to replace advice given to you by your health care provider. Make sure you discuss any questions you have with your health care provider. Document Revised: 02/15/2019 Document Reviewed: 02/15/2019 Elsevier Patient Education  2021 [employer] Inc.  

## 2020-08-19 ENCOUNTER — Telehealth: Payer: Self-pay | Admitting: Hematology

## 2020-08-19 ENCOUNTER — Telehealth: Payer: Self-pay

## 2020-08-19 DIAGNOSIS — Z23 Encounter for immunization: Secondary | ICD-10-CM

## 2020-08-19 NOTE — Telephone Encounter (Signed)
Scheduled follow-up appointment per 1/29 schedule message. Patient is aware.

## 2020-08-19 NOTE — Telephone Encounter (Signed)
Patient calls nurse line requesting shingles vaccine. Informed patient we do not have this at our office, however can send script to pharmacy. Patient uses [facility]. Will forward to PCP.

## 2020-08-20 ENCOUNTER — Telehealth (HOSPITAL_COMMUNITY): Payer: Self-pay | Admitting: Vascular Surgery

## 2020-08-20 ENCOUNTER — Other Ambulatory Visit: Payer: Self-pay

## 2020-08-20 ENCOUNTER — Ambulatory Visit (HOSPITAL_COMMUNITY)
Admission: RE | Admit: 2020-08-20 | Discharge: 2020-08-20 | Disposition: A | Discharge status: Home / Self Care | Payer: Medicaid Other | Source: Ambulatory Visit | Attending: Internal Medicine | Admitting: Internal Medicine

## 2020-08-20 ENCOUNTER — Ambulatory Visit (HOSPITAL_BASED_OUTPATIENT_CLINIC_OR_DEPARTMENT_OTHER)
Admission: RE | Admit: 2020-08-20 | Discharge: 2020-08-20 | Disposition: A | Discharge status: Home / Self Care | Payer: Medicaid Other | Source: Ambulatory Visit | Attending: Internal Medicine | Admitting: Internal Medicine

## 2020-08-20 ENCOUNTER — Other Ambulatory Visit: Payer: Self-pay | Admitting: Family Medicine

## 2020-08-20 VITALS — BP 124/84 | HR 73 | Wt 144.2 lb

## 2020-08-20 DIAGNOSIS — I82621 Acute embolism and thrombosis of deep veins of right upper extremity: Secondary | ICD-10-CM | POA: Diagnosis not present

## 2020-08-20 DIAGNOSIS — Z79899 Other long term (current) drug therapy: Secondary | ICD-10-CM | POA: Insufficient documentation

## 2020-08-20 DIAGNOSIS — Z853 Personal history of malignant neoplasm of breast: Secondary | ICD-10-CM | POA: Diagnosis not present

## 2020-08-20 DIAGNOSIS — G629 Polyneuropathy, unspecified: Secondary | ICD-10-CM | POA: Insufficient documentation

## 2020-08-20 DIAGNOSIS — Z0189 Encounter for other specified special examinations: Secondary | ICD-10-CM | POA: Diagnosis not present

## 2020-08-20 DIAGNOSIS — C50919 Malignant neoplasm of unspecified site of unspecified female breast: Secondary | ICD-10-CM | POA: Diagnosis not present

## 2020-08-20 DIAGNOSIS — I110 Hypertensive heart disease with heart failure: Secondary | ICD-10-CM | POA: Diagnosis not present

## 2020-08-20 DIAGNOSIS — I10 Essential (primary) hypertension: Secondary | ICD-10-CM

## 2020-08-20 DIAGNOSIS — I509 Heart failure, unspecified: Secondary | ICD-10-CM | POA: Diagnosis present

## 2020-08-20 DIAGNOSIS — Z7901 Long term (current) use of anticoagulants: Secondary | ICD-10-CM | POA: Insufficient documentation

## 2020-08-20 LAB — ECHOCARDIOGRAM COMPLETE
Area-P 1/2: 5.27 cm2
Calc EF: 58 %
S' Lateral: 3.6 cm
Single Plane A2C EF: 60.7 %
Single Plane A4C EF: 53.7 %

## 2020-08-20 MED ORDER — SHINGRIX 50 MCG/0.5ML IM SUSR: 0.5000 mL | Freq: Once | INTRAMUSCULAR | 0 refills | Status: DC

## 2020-08-20 NOTE — Progress Notes (Signed)
  Echocardiogram 2D Echocardiogram has been performed.  Teresa Hays 08/20/2020, 10:42 AM

## 2020-08-20 NOTE — Telephone Encounter (Signed)
Rx sent to patient's appropriate pharmacy.   [name], DO 08/20/2020, 3:23 PM PGY-2, [place]

## 2020-08-20 NOTE — Addendum Note (Signed)
Encounter addended by: Stanford Scotland, RN on: 08/20/2020 11:10 AM  Actions taken: Order list changed, Diagnosis association updated, Clinical Note Signed

## 2020-08-20 NOTE — Patient Instructions (Signed)
Your physician has requested that you have an echocardiogram. Echocardiography is a painless test that uses sound waves to create images of your heart. It provides your doctor with information about the size and shape of your heart and how well your heart's chambers and valves are working. This procedure takes approximately one hour. There are no restrictions for this procedure.  Your physician recommends that you schedule a follow-up appointment in: 4 months with echocardiogram  If you have any questions or concerns before your next appointment please send [place] a message through [place] or call our office at [phone].    TO LEAVE A MESSAGE FOR THE NURSE SELECT OPTION 2, PLEASE LEAVE A MESSAGE INCLUDING: . YOUR NAME . DATE OF BIRTH . CALL BACK NUMBER . REASON FOR CALL**this is important as we prioritize the call backs  [place] AS LONG AS YOU CALL BEFORE 4:00 PM  At the [facility], you and your health needs are our priority. As part of our continuing mission to provide you with exceptional heart care, we have created designated Provider Care Teams. These Care Teams include your primary Cardiologist (physician) and Advanced Practice Providers (APPs- Physician Assistants and Nurse Practitioners) who all work together to provide you with the care you need, when you need it.   You may see any of the following providers on your designated Care Team at your next follow up: Marland Kitchen Dr Glori Bickers . Dr Loralie Champagne . Darrick Grinder, NP . Lyda Jester, [place] . Audry Riles, PharmD   Please be sure to bring in all your medications bottles to every appointment.

## 2020-08-20 NOTE — Progress Notes (Signed)
Cardio-Oncology Clinic Note    Date:  08/20/2020   ID:  [name], [name] [birth year], MRN [id number]  Location: Home  Provider location: [facility] Type of Visit: New patient  PCP:  Teresa Fee, DO  Cardiologist:  No primary care provider on file.  Referring: Dr. Burr Medico   History of Present Illness:  Teresa Hays is a 65 y/o woman with GERD, HTN and metastatic breast CA referred by Dr. Burr Medico for enrollment into the Cardio-Oncology program.   SUMMARY OF ONCOLOGIC HISTORY:     Oncology History   Cancer Staging Metastatic breast cancer [facility]) Staging form: Breast, AJCC 8th Edition - Clinical stage from 03/24/2018: Stage IV (cT2, cN1, pM1, G3, ER+, PR+, HER2+) - Signed by Teresa Merle, MD on 03/30/2018       Metastatic breast cancer (Teresa Hays)   01/30/2018 Procedure    Colonoscopy showed small polyp in the sigmoid colon, removed, the exam of colon including the terminal ileum was otherwise negative.    01/30/2018 Procedure    EGD by Dr. Collene Mares showed small hiatal hernia, a 8 mm polypoid lesion in the cardia, biopsied.    03/09/2018 Imaging    03/09/2018 US Abdomen IMPRESSION: 1. Mass lesions throughout the liver, consistent with metastatic disease. Liver as a somewhat nodular contour suggesting underlying hepatic cirrhosis. Inhomogeneous echotexture to the liver.  2. Cholelithiasis with mild gallbladder wall thickening. A degree of cholecystitis cannot be excluded by ultrasound.  3. Portions of pancreas obscured by gas. Visualized portions of pancreas appear normal.  4. Small right kidney. Etiology uncertain. This finding potentially may be indicative of renal artery stenosis. In this regard, question whether patient is hypertensive.    03/15/2018 Imaging    CT CAP with contrast  IMPRESSION: 1. Widespread hepatic metastasis. 2. 2.6 cm lateral right breast soft tissue nodule could represent a breast primary or an  incidental benign lesion. Consider correlation with mammogram and ultrasound. 3. No definite source of primary malignancy identified within the abdomen or pelvis. There is possible rectosigmoid junction wall thickening. Consider colonoscopy with attention to this area. 4. Distal esophageal wall thickening, suggesting esophagitis.    03/24/2018 Cancer Staging    Staging form: Breast, AJCC 8th Edition - Clinical stage from 03/24/2018: Stage IV (cT2, cN1, pM1, G3, ER+, PR+, HER2+) - Signed by Teresa Merle, MD on 03/30/2018    03/24/2018 Initial Biopsy    Diagnosis 1. 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 Breast, right, needle core biopsy, 9 o'clock, 7cm from nipple - INVASIVE DUCTAL CARCINOMA. -The carcinoma is somewhat morphologically dissimilar from that in part 1. It appears grade III 3. Lymph node, needle/core biopsy, right axillary - METASTATIC CARCINOMA IN 1 OF 1 LYMPH NODE (1/1).    03/24/2018 Receptors her2    Breast biopsy: 1. Estrogen Receptor: 40%, POSITIVE, STRONG-MODERATE STAINING INTENSITY Progesterone Receptor: 70%, POSITIVE, STRONG STAINING INTENSITY Proliferation Marker Ki67: 20% HER 2 equivocal by IHC 2+, POSITIVE by FISH, ratio 2.4 and copy #4.2  2. Estrogen Receptor: 60%, POSITIVE, MODERATE STAINING INTENSITY Progesterone Receptor: 40%, POSITIVE, MODERATE STAINING INTENSITY Proliferation Marker Ki67: 20% HER2 (+) by IHC 3+    03/24/2018 Initial Diagnosis    Metastatic breast cancer ([place])    03/27/2018 Pathology Results    Diagnosis Liver, needle/core biopsy, Right - METASTATIC CARCINOMA TO LIVER, CONSISTENT WITH PATIENTS CLINICAL HISTORY OF PRIMARY BREAST CARCINOMA.  ER 80%+  PR40%+ HER2- (by [facility], IHC 2+)  Ki67 50%     03/28/2018 Pathology Results    03/28/2018 Surgical Pathology Diagnosis 1. Breast, left, needle core biopsy, 9 o'clock - FIBROCYSTIC  CHANGES. - THERE IS NO EVIDENCE OF MALIGNANCY. 2. Breast, left, needle core biopsy, 2 o'clock - FIBROADENOMA. - THERE IS NO EVIDENCE OF MALIGNANCY. - SEE COMMENT.    03/29/2018 Imaging    03/29/2018 Bone Scan IMPRESSION: No scintigraphic evidence of osseous metastatic disease.    03/30/2018 Imaging    Bone scan  IMPRESSION: No scintigraphic evidence of osseous metastatic disease.     04/07/2018 -  Chemotherapy    First line chemo weekly Taxol and herceptin/Perjeta every 3 weeks starting 04/07/18. She developed infusion reaction to taxol and it was discontinued. Added Abraxane on C1D8, 2 weeks on/1 week off.  Abraxne stopped after 10/13/18 due to worsening Neuropathy    06/06/2018 Imaging    CT CAP IMPRESSION: 1. Generally improved appearance, with reduced axillary adenopathy and reduced enhancing component of the hepatic masses, with some of the hepatic mass is moderately smaller than on the prior exam. Reduced size of the right lateral breast mass compared to the prior 03/15/2018 exam. 2. New mild interstitial accentuation in the lungs, significance uncertain. Part of this appearance may be due to lower lung volumes on today's exam. 3. Mild wall thickening in the descending colon and upper rectum suggesting low-grade colitis/inflammation. Prominent stool throughout the colon favors constipation. 4. Other imaging findings of potential clinical significance: Aortic Atherosclerosis (ICD10-I70.0). Mild cardiomegaly. Mild nodularity in the right lower lobe appears stable. Contracted and thick-walled gallbladder.     09/18/2018 Imaging    CT CAP W Contrast 09/18/18  IMPRESSION: 1. Liver metastases have decreased in size. 2. Right breast mass, mild right axillary lymphadenopathy and scattered tiny right pulmonary nodules are all stable. 3. New mild left supraclavicular and left subpectoral lymphadenopathy, can not exclude progression of metastatic nodal disease. 4.  Moderate colorectal stool volume, which may indicate constipation. 5. Aortic Atherosclerosis (ICD10-I70.0).    10/2018 -  Anti-estrogen oral therapy    Letrozole 2.5 mg daily starting 10/2018     She presents today for routine f/u. On 08/08/20 switched to Enhertu and didn't tolerate well at all with nausea. Will d/w Dr. Burr Medico. No CP or SOB. BP well controlled  Echo today     Echo 04/23/20 04/23/20 EF 50-55% GLS -14.3% Personally reviewed Echo 5/21 EF 50-55% GLS -15.0%  Echo 2/21 EF 55-60% GLS - 15.8%  Echo 11/20 EF 55% GLS -17.9%  Echo 9/20 EF 50-55% GLS - 14.4% Echo 02/27/19 EF 50% GLS -21.4%  Echo 07/28/18 EF 60-65% GLS -19.2% Echo 12/1018  EF 55-60% GLS -16.1%     Past Medical History:  Diagnosis Date  . GERD (gastroesophageal reflux disease)   . Hypertension   . Personal history of chemotherapy   . rt breast ca with mets to liver dx'd 03/2018   Past Surgical History:  Procedure Laterality Date  . BREAST BIOPSY Right 03/24/2018   CA x3  . BREAST BIOPSY Left 03/28/2018   neg  . BREAST BIOPSY Left 03/30/2018   neg  . COLONOSCOPY    . ESOPHAGOGASTRODUODENOSCOPY ENDOSCOPY    . IR IMAGING GUIDED PORT INSERTION  04/04/2018     Current Outpatient Medications  Medication Sig Dispense Refill  . albuterol (VENTOLIN HFA) 108 (90 Base) MCG/ACT inhaler Inhale 1-2 puffs into the lungs every 6 (six) hours as needed for wheezing or shortness of  breath.    Marland Kitchen amLODipine (NORVASC) 5 MG tablet TAKE 1 TABLET BY MOUTH DAILY. 30 tablet 2  . budesonide-formoterol (SYMBICORT) 160-4.5 MCG/ACT inhaler Inhale 2 puffs into the lungs 2 (two) times daily. Rinse mouth after each use. 1 Inhaler 6  . carvedilol (COREG) 3.125 MG tablet TAKE 1 TABLET BY MOUTH TWICE DAILY. REPLACES ATENOLOL 180 tablet 3  . diphenoxylate-atropine (LOMOTIL) 2.5-0.025 MG tablet Take 1-2 tablets by mouth 4 (four) times daily as needed for diarrhea or loose stools. 60 tablet 1  . DULoxetine (CYMBALTA) 20 MG capsule TAKE 2  CAPSULES BY MOUTH DAILY 60 capsule 3  . gabapentin (NEURONTIN) 300 MG capsule May take up to 3 capsules three times daily 270 capsule 3  . lidocaine (XYLOCAINE) 2 % jelly Apply 1 application topically as needed.    Marland Kitchen losartan (COZAAR) 50 MG tablet Take 2 tablets (100 mg total) by mouth daily. 90 tablet 3  . montelukast (SINGULAIR) 10 MG tablet TAKE 1 TABLET (10 MG TOTAL) BY MOUTH AT BEDTIME. 30 tablet 5  . omeprazole (PRILOSEC) 40 MG capsule TAKE 1 CAPSULE BY MOUTH TWICE DAILY 60 capsule 3  . ondansetron (ZOFRAN) 8 MG tablet Take 1 tablet (8 mg total) by mouth every 8 (eight) hours as needed for nausea or vomiting. 30 tablet 3  . potassium chloride SA (KLOR-CON) 20 MEQ tablet TAKE 1 TABLET BY MOUTH ONCE DAILY 30 tablet 3  . predniSONE (DELTASONE) 10 MG tablet 4 x 4 days, 3 x 4 days, 2 x 4 days, 1 x 4 days then stop 40 tablet 0  . prochlorperazine (COMPAZINE) 10 MG tablet Take 1 tablet (10 mg total) by mouth every 6 (six) hours as needed for nausea or vomiting. 30 tablet 3  . rivaroxaban (XARELTO) 20 MG TABS tablet TAKE 1 TABLET BY MOUTH DAILY WITH SUPPER 30 tablet 5  . spironolactone (ALDACTONE) 25 MG tablet TAKE 1 TABLET BY MOUTH ONCE DAILY 90 tablet 3  . traMADol (ULTRAM) 50 MG tablet Take 1 tablet (50 mg total) by mouth every 6 (six) hours as needed for moderate pain or severe pain. 30 tablet 0   No current facility-administered medications for this encounter.   Facility-Administered Medications Ordered in Other Encounters  Medication Dose Route Frequency Provider Last Rate Last Admin  . sodium chloride flush (NS) 0.9 % injection 10 mL  10 mL Intracatheter PRN Malachy Mood, MD   10 mL at 05/28/20 0935    Allergies:   Patient has no known allergies.   Social History:  The patient  reports that she has never smoked. She has never used smokeless tobacco. She reports that she does not drink alcohol and does not use drugs.   Family History:  The patient's family history includes Breast cancer in  her sister; Cancer (age of onset: 63) in her sister; Diabetes in her mother; Rheum arthritis in her sister.   ROS:  Please see the history of present illness.   All other systems are personally reviewed and negative.   Vitals:   08/20/20 1054  BP: 124/84  Pulse: 73  SpO2: 100%  Weight: 65.4 kg (144 lb 3.2 oz)    Exam:   General:  Well appearing. No resp difficulty HEENT: normal Neck: supple. no JVD. Carotids 2+ bilat; no bruits. No lymphadenopathy or thryomegaly appreciated. Cor: PMI nondisplaced. Regular rate & rhythm. No rubs, gallops or murmurs. Lungs: clear Abdomen: soft, nontender, nondistended. No hepatosplenomegaly. No bruits or masses. Good bowel sounds. Extremities: no cyanosis, clubbing,  rash, edema Neuro: alert & orientedx3, cranial nerves grossly intact. moves all 4 extremities w/o difficulty. Affect pleasant  Recent Labs: 08/15/2020: ALT 44; BUN 20; Creatinine 1.77; Hemoglobin 11.0; Platelet Count 157; Potassium 5.0; Sodium 128  Personally reviewed   Wt Readings from Last 3 Encounters:  08/15/20 64.3 kg (141 lb 12 oz)  08/08/20 67.4 kg (148 lb 8 oz)  07/30/20 66.5 kg (146 lb 8 oz)      ASSESSMENT AND PLAN:  1. Breast cancer, metastatic - She has underwent first-line weekly TaxolwithHerceptin/Perjeta every 3 weeks starting 04/07/18. She developed infusion reaction to taxol and it was discontinued. Changed to Abraxaneweekly2 weeks on/1 week offfrom 07/21/2018.Abraxane held after October 13, 2018 due to his worsening neuropathy. Herceptin/Perjeta switched to Enhertu in 1/22. Now on Letrozole 2.5 mg daily starting 10/2018 - Echo 8/20 EF down to 45-50% suspicious for mild Herceptin cardiotoxicity.  - Herceptin/perjeta held  for one dose.  - Echo 9/20 EF 50-55%  - Echo 05/29/19 EF stable at 55% GLS -17.9 % - Echo 2/21  EF 55-60% GLS -15.8 - Echo 5/21 EF 50-55% GLS -15.0 - Echo 04/23/20 EF 50-55% GLS -14.3%  - Echo today 08/20/20 EF 55-60% Grade 1DD GLS -19.4% (switched  to Enhertu 1/22) - Echo looks good today. Not sure if this is related to switch from Herceptin/Perjeta to Enhertu or just EF stabilizing. Will continue with echos every 3-4 months for now until treatment regimen stabilized  - Continue carvedilol 3.125 bid - Continue losartan to 100 mg daily - Continue spiro 25 - Continue herceptin/perjeta with close f/u as ideally will need life-long therapy  2. HTN  - Blood pressure elevated. Increase losartan 100 daily.   3. RUE DVT - continue Xarelto. No bleeding   Glori Bickers, MD  08/20/2020 10:36 AM  [facility] [place] [id number] [phone] (office) [phone] (fax)

## 2020-08-20 NOTE — Progress Notes (Signed)
[place]   Telephone:([phone]) [phone] Fax:(336) [phone]   Clinic Follow up Note   Patient Care Team: [name], DO as PCP - General (Family Medicine) [name], MD as Consulting Physician (Gastroenterology) [name], MD as Consulting Physician (Hematology)  Date of Service:  08/22/2020   I connected with [name] on 08/22/2020 at  1:20 PM EST by telephone visit and verified that I am speaking with the correct person using two identifiers.  I discussed the limitations, risks, security and privacy concerns of performing an evaluation and management service by telephone and the availability of in person appointments. I also discussed with the patient that there may be a patient responsible charge related to this service. The patient expressed understanding and agreed to proceed.   Other persons participating in the visit and their role in the encounter:  None  Patient's location:  Her home  Provider's location:  My office   CHIEF COMPLAINT: F/u for metastatic breast cancer  SUMMARY OF ONCOLOGIC HISTORY: Oncology History Overview Note  Cancer Staging Metastatic breast cancer [facility]) Staging form: Breast, AJCC 8th Edition - Clinical stage from 03/24/2018: Stage IV (cT2, cN1, pM1, G3, ER+, PR+, HER2+) - Signed by [name], MD on 03/30/2018     Metastatic breast cancer ([place])  01/30/2018 Procedure   Colonoscopy showed small polyp in the sigmoid colon, removed, the exam of colon including the terminal ileum was otherwise negative.   01/30/2018 Procedure   EGD by Dr. Collene Mares showed small hiatal hernia, a 8 mm polypoid lesion in the cardia, biopsied.   03/09/2018 Imaging   03/09/2018 US Abdomen IMPRESSION: 1. Mass lesions throughout the liver, consistent with metastatic disease. Liver as a somewhat nodular contour suggesting underlying hepatic cirrhosis. Inhomogeneous echotexture to the liver.  2. Cholelithiasis with mild gallbladder wall thickening. A  degree of cholecystitis cannot be excluded by ultrasound.  3. Portions of pancreas obscured by gas. Visualized portions of pancreas appear normal.  4. Small right kidney. Etiology uncertain. This finding potentially may be indicative of renal artery stenosis. In this regard, question whether patient is hypertensive.   03/15/2018 Imaging   CT CAP with contrast  IMPRESSION: 1. Widespread hepatic metastasis. 2. 2.6 cm lateral right breast soft tissue nodule could represent a breast primary or an incidental benign lesion. Consider correlation with mammogram and ultrasound. 3. No definite source of primary malignancy identified within the abdomen or pelvis. There is possible rectosigmoid junction wall thickening. Consider colonoscopy with attention to this area. 4. Distal esophageal wall thickening, suggesting esophagitis.   03/24/2018 Cancer Staging   Staging form: Breast, AJCC 8th Edition - Clinical stage from 03/24/2018: Stage IV (cT2, cN1, pM1, G3, ER+, PR+, HER2+) - Signed by [name], MD on 03/30/2018   03/24/2018 Initial Biopsy   Diagnosis 1. 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 Breast, right, needle core biopsy, 9 o'clock, 7cm from nipple - INVASIVE DUCTAL CARCINOMA. -The carcinoma is somewhat morphologically dissimilar from that in part 1. It appears grade III 3. Lymph node, needle/core biopsy, right axillary - METASTATIC CARCINOMA IN 1 OF 1 LYMPH NODE (1/1).   03/24/2018 Receptors her2   Breast biopsy: 1. Estrogen Receptor: 40%, POSITIVE, STRONG-MODERATE STAINING INTENSITY Progesterone Receptor: 70%, POSITIVE, STRONG STAINING INTENSITY Proliferation Marker Ki67: 20% HER 2 equivocal by IHC 2+, POSITIVE by FISH, ratio 2.4 and copy #4.2  2. Estrogen Receptor: 60%, POSITIVE, MODERATE STAINING  INTENSITY Progesterone Receptor: 40%, POSITIVE, MODERATE STAINING INTENSITY Proliferation  Marker Ki67: 20% HER2 (+) by IHC 3+   03/24/2018 Initial Diagnosis   Metastatic breast cancer (H[facility])   03/27/2018 Pathology Results   Diagnosis Liver, needle/core biopsy, Right - METASTATIC CARCINOMA TO LIVER, CONSISTENT WITH PATIENTS CLINICAL HISTORY OF PRIMARY BREAST CARCINOMA.  ER 80%+ PR40%+ HER2- (by [facility], IHC 2+)  Ki67 50%    03/28/2018 Pathology Results   03/28/2018 Surgical Pathology Diagnosis 1. Breast, left, needle core biopsy, 9 o'clock - FIBROCYSTIC CHANGES. - THERE IS NO EVIDENCE OF MALIGNANCY. 2. Breast, left, needle core biopsy, 2 o'clock - FIBROADENOMA. - THERE IS NO EVIDENCE OF MALIGNANCY. - SEE COMMENT.   03/29/2018 Imaging   03/29/2018 Bone Scan IMPRESSION: No scintigraphic evidence of osseous metastatic disease.   03/30/2018 Imaging   Bone scan  IMPRESSION: No scintigraphic evidence of osseous metastatic disease.    04/07/2018 - 07/30/2020 Chemotherapy   First line chemo weekly Taxol and herceptin/Perjeta every 3 weeks starting 04/07/18. She developed infusion reaction to taxol and it was discontinued. Added Abraxane on C1D8, 2 weeks on/1 week off.  Abraxne stopped after 10/13/18 due to worsening Neuropathy. She has continued with maintenance Herceptin injection/Perjeta q3weeks. Herceptin changed to injection on 04/06/19. Both injections switched to combination [place] on 10/01/19. ----Stopped 07/30/20 due to disease progression in liver.    06/06/2018 Imaging   CT CAP IMPRESSION: 1. Generally improved appearance, with reduced axillary adenopathy and reduced enhancing component of the hepatic masses, with some of the hepatic mass is moderately smaller than on the prior exam. Reduced size of the right lateral breast mass compared to the prior 03/15/2018 exam. 2. New mild interstitial accentuation in the lungs, significance uncertain. Part of this appearance may be due to lower lung volumes on today's exam. 3. Mild wall thickening in the descending colon and upper rectum  suggesting low-grade colitis/inflammation. Prominent stool throughout the colon favors constipation. 4. Other imaging findings of potential clinical significance: Aortic Atherosclerosis (ICD10-I70.0). Mild cardiomegaly. Mild nodularity in the right lower lobe appears stable. Contracted and thick-walled gallbladder.    09/18/2018 Imaging   CT CAP W Contrast 09/18/18  IMPRESSION: 1. Liver metastases have decreased in size. 2. Right breast mass, mild right axillary lymphadenopathy and scattered tiny right pulmonary nodules are all stable. 3. New mild left supraclavicular and left subpectoral lymphadenopathy, can not exclude progression of metastatic nodal disease. 4. Moderate colorectal stool volume, which may indicate constipation. 5.  Aortic Atherosclerosis (ICD10-I70.0).   10/2018 - 07/30/2020 Anti-estrogen oral therapy   Letrozole 2.5 mg daily starting 10/2018 D/c to proceed with Inhertu treatment after liver met progression.    01/10/2019 Imaging   CT CAP W Contrast 01/10/19  IMPRESSION: 1. Continued improvement in the hepatic metastatic lesions which have reduced in size. 2. Stable mild left supraclavicular and subpectoral adenopathy. 3. Essentially stable small right lower lobe pulmonary nodule and separate small subpleural nodule along the right hemidiaphragm. Surveillance suggested. 4. Other imaging findings of potential clinical significance: Mild cardiomegaly. Mild circumferential distal esophageal wall thickening, the most common cause would be esophagitis. Airway thickening is present, suggesting bronchitis or reactive airways disease. Airway plugging in the lower lobes and in the right middle lobe. Stable small right adrenal adenoma.   05/15/2019 Imaging   CT CAP W Contrast 05/15/19  IMPRESSION: CT CHEST IMPRESSION   1. Similar appearance of borderline supraclavicular, axillary, and subpectoral adenopathy. 2. Improved and resolved right lower lobe pulmonary  nodularity. 3. Esophageal air fluid level suggests  dysmotility or gastroesophageal reflux.   CT ABDOMEN AND PELVIS IMPRESSION   1. Improved hepatic metastasis. 2. Small bowel mesenteric lymph nodes which are upper normal and mildly enlarged. Likely increased and similar as detailed above. Indeterminate. Recommend attention on follow-up. 3. Cholelithiasis. 4. Motion degradation throughout the lower chest and abdomen.   09/19/2019 Imaging   CT CAP W contrast  IMPRESSION: 1. Interval decrease in size of right axillary and subpectoral lymph nodes. There are no pathologically enlarged lymph nodes remaining in the chest, abdomen, or pelvis. No new lymphadenopathy. 2. No significant change in post treatment appearance of multiple low-attenuation liver lesions, in keeping with treated metastases. 3. No evidence of new metastatic disease in the chest, abdomen, or pelvis. 4. Aortic Atherosclerosis (ICD10-I70.0).   11/16/2019 Imaging   IMRI Brain  MPRESSION: Minimal chronic microvascular ischemic changes. No acute intracranial process.   Active bilateral maxillary sinus disease with mild frontoethmoid mucosal thickening.   01/23/2020 Imaging   CT CAP W contrast  IMPRESSION: 1. Stable exam. No new or progressive interval findings. 2. Stable appearance of upper normal right axillary lymph nodes. Tiny subpectoral and left axillary nodes are unchanged. 3. Generally similar appearance of ill-defined hypoattenuating lesions in the liver compatible with treated metastases. 1 lesion in the central liver is minimally more conspicuous today, likely related to bolus timing and attention on follow-up recommended. No new suspicious liver lesion on today's study. 4. Stable 10 mm right adrenal nodule., indeterminate. Continued attention on follow-up imaging recommended. 5. Cholelithiasis. 6. Aortic Atherosclerosis (ICD10-I70.0).   02/11/2020 Breast MRI   IMPRESSION: 1. 7 millimeter focus of  residual enhancement associated with the known malignancy in the 9 o'clock location of the RIGHT breast. 2. No significant enhancement in the known malignancy in the 11:30 o'clock location of the RIGHT breast. 3. Interval resolution of axillary adenopathy.   07/28/2020 Imaging   CT CAP  IMPRESSION: 1. Interval progression of hepatic metastases. 2. Stable indeterminate right adrenal nodule. 3. No new sites of metastatic disease in the chest, abdomen or pelvis. 4. Chronic findings include: Small hiatal hernia. Cholelithiasis. Aortic Atherosclerosis (ICD10-I70.0).     08/08/2020 -  Chemotherapy   Second-line Inhertu q3weeks starting 08/08/20       CURRENT THERAPY:  Second-line Enhertu q3weeks starting 08/08/20  INTERVAL HISTORY:  LETTY SALVI is here for a follow up. She presents to the clinic alone. She notes she feels better. She notes she is drinking more water. She is drinking 3 bottles that are 16 ounces. She is eating adequately. She notes her energy has improved. Her diarrhea resolved 2 days ago. She notes she recently say Dr Valeta Harms and breathing is doing well.    REVIEW OF SYSTEMS:   Constitutional: Denies fevers, chills or abnormal weight loss Eyes: Denies blurriness of vision Ears, nose, mouth, throat, and face: Denies mucositis or sore throat Respiratory: Denies cough, dyspnea or wheezes Cardiovascular: Denies palpitation, chest discomfort or lower extremity swelling Gastrointestinal:  Denies nausea, heartburn or change in bowel habits Skin: Denies abnormal skin rashes Lymphatics: Denies new lymphadenopathy or easy bruising Neurological:Denies numbness, tingling or new weaknesses Behavioral/Psych: Mood is stable, no new changes  All other systems were reviewed with the patient and are negative.  MEDICAL HISTORY:  Past Medical History:  Diagnosis Date  . GERD (gastroesophageal reflux disease)   . Hypertension   . Personal history of chemotherapy   . rt  breast ca with mets to liver dx'd 03/2018    SURGICAL HISTORY: Past Surgical  History:  Procedure Laterality Date  . BREAST BIOPSY Right 03/24/2018   CA x3  . BREAST BIOPSY Left 03/28/2018   neg  . BREAST BIOPSY Left 03/30/2018   neg  . COLONOSCOPY    . ESOPHAGOGASTRODUODENOSCOPY ENDOSCOPY    . IR IMAGING GUIDED PORT INSERTION  04/04/2018    I have reviewed the social history and family history with the patient and they are unchanged from previous note.  ALLERGIES:  has No Known Allergies.  MEDICATIONS:  Current Outpatient Medications  Medication Sig Dispense Refill  . albuterol (VENTOLIN HFA) 108 (90 Base) MCG/ACT inhaler Inhale 1-2 puffs into the lungs every 6 (six) hours as needed for wheezing or shortness of breath. 18 g 6  . amLODipine (NORVASC) 5 MG tablet TAKE 1 TABLET BY MOUTH DAILY. 30 tablet 2  . Budeson-Glycopyrrol-Formoterol (BREZTRI AEROSPHERE) 160-9-4.8 MCG/ACT AERO Inhale 2 puffs into the lungs in the morning and at bedtime. 8 g 0  . budesonide-formoterol (SYMBICORT) 160-4.5 MCG/ACT inhaler Inhale 2 puffs into the lungs 2 (two) times daily. Rinse mouth after each use. 1 each 6  . carvedilol (COREG) 3.125 MG tablet TAKE 1 TABLET BY MOUTH TWICE DAILY. REPLACES ATENOLOL 180 tablet 3  . diphenoxylate-atropine (LOMOTIL) 2.5-0.025 MG tablet Take 1-2 tablets by mouth 4 (four) times daily as needed for diarrhea or loose stools. 60 tablet 1  . DULoxetine (CYMBALTA) 20 MG capsule TAKE 2 CAPSULES BY MOUTH DAILY 60 capsule 3  . gabapentin (NEURONTIN) 300 MG capsule May take up to 3 capsules three times daily 270 capsule 3  . lidocaine (XYLOCAINE) 2 % jelly Apply 1 application topically as needed.    Marland Kitchen losartan (COZAAR) 50 MG tablet Take 2 tablets (100 mg total) by mouth daily. 90 tablet 3  . montelukast (SINGULAIR) 10 MG tablet TAKE 1 TABLET (10 MG TOTAL) BY MOUTH AT BEDTIME. 30 tablet 5  . omeprazole (PRILOSEC) 40 MG capsule TAKE 1 CAPSULE BY MOUTH TWICE DAILY 60 capsule 3  .  ondansetron (ZOFRAN) 8 MG tablet Take 1 tablet (8 mg total) by mouth every 8 (eight) hours as needed for nausea or vomiting. 30 tablet 3  . potassium chloride SA (KLOR-CON) 20 MEQ tablet TAKE 1 TABLET BY MOUTH ONCE DAILY 30 tablet 3  . predniSONE (DELTASONE) 10 MG tablet 4 x 4 days, 3 x 4 days, 2 x 4 days, 1 x 4 days then stop 40 tablet 0  . prochlorperazine (COMPAZINE) 10 MG tablet Take 1 tablet (10 mg total) by mouth every 6 (six) hours as needed for nausea or vomiting. 30 tablet 3  . rivaroxaban (XARELTO) 20 MG TABS tablet TAKE 1 TABLET BY MOUTH DAILY WITH SUPPER 30 tablet 5  . spironolactone (ALDACTONE) 25 MG tablet TAKE 1 TABLET BY MOUTH ONCE DAILY 90 tablet 3  . traMADol (ULTRAM) 50 MG tablet Take 1 tablet (50 mg total) by mouth every 6 (six) hours as needed for moderate pain or severe pain. 30 tablet 0   No current facility-administered medications for this visit.   Facility-Administered Medications Ordered in Other Visits  Medication Dose Route Frequency Provider Last Rate Last Admin  . sodium chloride flush (NS) 0.9 % injection 10 mL  10 mL Intracatheter PRN [name], MD   10 mL at 05/28/20 0935    PHYSICAL EXAMINATION: ECOG PERFORMANCE STATUS: 2 - Symptomatic, <50% confined to bed  No vitals taken today, Exam not performed today  LABORATORY DATA:  I have reviewed the data as listed CBC Latest Ref  Rng & Units 08/15/2020 08/08/2020 07/30/2020  WBC 4.0 - 10.5 K/uL 7.3 10.0 8.3  Hemoglobin 12.0 - 15.0 g/dL 11.0(L) 10.4(L) 10.1(L)  Hematocrit 36.0 - 46.0 % 33.9(L) 32.5(L) 31.7(L)  Platelets 150 - 400 K/uL 157 218 225     CMP Latest Ref Rng & Units 08/15/2020 08/08/2020 07/30/2020  Glucose 70 - 99 mg/dL 126(H) 93 94  BUN 8 - 23 mg/dL 20 14 11   Creatinine 0.44 - 1.00 mg/dL 1.77(H) 1.28(H) 1.20(H)  Sodium 135 - 145 mmol/L 128(L) 137 137  Potassium 3.5 - 5.1 mmol/L 5.0 3.9 3.4(L)  Chloride 98 - 111 mmol/L 97(L) 102 104  CO2 22 - 32 mmol/L 24 25 26   Calcium 8.9 - 10.3 mg/dL 9.0 9.2  9.8  Total Protein 6.5 - 8.1 g/dL 6.8 6.6 7.1  Total Bilirubin 0.3 - 1.2 mg/dL 0.6 0.4 0.4  Alkaline Phos 38 - 126 U/L 171(H) 149(H) 152(H)  AST 15 - 41 U/L 29 25 23   ALT 0 - 44 U/L 44 35 17      RADIOGRAPHIC STUDIES: I have personally reviewed the radiological images as listed and agreed with the findings in the report. No results found.   ASSESSMENT & PLAN:  Teresa Hays is a 65 y.o. female with   1. 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 Liver met was HER2-.  -Given the metastatic disease, her cancer is not curable but still treatable. -She had been treated with first linemaintenancetreatment with letrozole,trastuzumab and Perjeta, until recent disease progression on 07/28/20 CT CAP.  -I started her on second-line Inhertu q3weeks starting 08/08/20. Goal is palliative to control disease and prolong her life. -she developed intermittent nausea, diarrhea, poor appetite after first cycle enhertu. Will dose reduction with cycle 2, and will continue supportive care   2.Peripheral neuropathy, grade 2, Right Foot pain,Secondarytochemotherapy -Ipreviouslystropped Abraxane on 10/13/18. -S/p PT and steroid injection her right foot pain is controlled. She still has mild tingling neuropathy in her toes an fingers. She has adequate function and denies falls. I discussed this can take time to resolve and may not fully resolve. -She will also continue OTC Calcium and Vit D,OralB12, Gabapentin 300mg 3 tabsTID andCymbalta to 40mg , Tramadol every other night for right foot pain.Well controlled. 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 Anemia, Secondary to  chemo -Continue monitoring, will consider blood transfusion if hemoglobin less than 8. -Mild and stable.  5Social support, Goal of Care Discussion -She lives alone, has her sister and niece in [place]. Her daughter lives in a few hours away  -On Ativan PRN due to anxietyabout diagnosis and treatment -She is full code  6.Asthma -She has been seen by Dr. Valeta Harms who notes this is related to her post nasal drip and her Acid reflux can contribute. -She is onPrilosec2 tabs BID,allergymedicationand nasal spray and inhalers.Her insurance denied Dexilant.  -Continue to f/u withDr Icard and Dr Collene Mares andpulmonary clinic. -Her breathing has been doing better. Continue to f/u with Dr Valeta Harms for better control.  7.Goal of care discussion  -We discussed the incurable nature of her cancer, and the overall poor prognosis, especially if she does not have good response to chemotherapy or progress on chemo -The patient understands the goal of care is palliative. -she is full code  now  8. Nausea, diarrhea and anorexia -Secondary to [facility]. She become very dehydrated with elevated Cr.  -Her diarrhea took a little over a week to resolve. She is still eating adequately. She notes energy has improved as well. She is willing to continue treatment as scheduled.   Plan: -lab, f/u and cycle 2 Enhertu on 2/11, will reduce dose to 4.$RemoveB'4mg'OIddezpQ$ /kg    No problem-specific Assessment & Plan notes found for this encounter.   No orders of the defined types were placed in this encounter.   I discussed the assessment and treatment plan with the patient. The patient was provided an opportunity to ask questions and all were answered. The patient agreed with the plan and demonstrated an understanding of the instructions.  The patient was advised to call back or seek an in-person evaluation if the symptoms worsen or if the condition fails to improve as anticipated.  The total time spent in the  appointment was 15 minutes.     [name], MD 08/22/2020   I, Joslyn Devon, am acting as scribe for [name], MD.   I have reviewed the above documentation for accuracy and completeness, and I agree with the above.

## 2020-08-21 ENCOUNTER — Other Ambulatory Visit: Payer: Self-pay | Admitting: Pulmonary Disease

## 2020-08-21 ENCOUNTER — Ambulatory Visit (INDEPENDENT_AMBULATORY_CARE_PROVIDER_SITE_OTHER): Payer: Medicaid Other | Admitting: Pulmonary Disease

## 2020-08-21 VITALS — BP 120/84 | HR 77 | Ht 64.0 in | Wt 144.2 lb

## 2020-08-21 DIAGNOSIS — J302 Other seasonal allergic rhinitis: Secondary | ICD-10-CM | POA: Diagnosis not present

## 2020-08-21 DIAGNOSIS — Z86711 Personal history of pulmonary embolism: Secondary | ICD-10-CM | POA: Diagnosis not present

## 2020-08-21 DIAGNOSIS — T451X5A Adverse effect of antineoplastic and immunosuppressive drugs, initial encounter: Secondary | ICD-10-CM

## 2020-08-21 DIAGNOSIS — J4520 Mild intermittent asthma, uncomplicated: Secondary | ICD-10-CM | POA: Insufficient documentation

## 2020-08-21 DIAGNOSIS — G620 Drug-induced polyneuropathy: Secondary | ICD-10-CM | POA: Diagnosis not present

## 2020-08-21 DIAGNOSIS — J4540 Moderate persistent asthma, uncomplicated: Secondary | ICD-10-CM | POA: Diagnosis not present

## 2020-08-21 DIAGNOSIS — R160 Hepatomegaly, not elsewhere classified: Secondary | ICD-10-CM

## 2020-08-21 DIAGNOSIS — C50919 Malignant neoplasm of unspecified site of unspecified female breast: Secondary | ICD-10-CM

## 2020-08-21 DIAGNOSIS — R898 Other abnormal findings in specimens from other organs, systems and tissues: Secondary | ICD-10-CM | POA: Diagnosis not present

## 2020-08-21 MED ORDER — BREZTRI AEROSPHERE 160-9-4.8 MCG/ACT IN AERO: 2.0000 | INHALATION_SPRAY | Freq: Two times a day (BID) | RESPIRATORY_TRACT | 0 refills | Status: DC

## 2020-08-21 MED ORDER — BUDESONIDE-FORMOTEROL FUMARATE 160-4.5 MCG/ACT IN AERO: 2.0000 | INHALATION_SPRAY | Freq: Two times a day (BID) | RESPIRATORY_TRACT | 6 refills | Status: DC

## 2020-08-21 MED ORDER — ALBUTEROL SULFATE HFA 108 (90 BASE) MCG/ACT IN AERS: 1.0000 | INHALATION_SPRAY | Freq: Four times a day (QID) | RESPIRATORY_TRACT | 6 refills | Status: DC | PRN

## 2020-08-21 MED FILL — PROAIR HFA 90 MCG INHALER: 108 (90 BAS | 25 days supply | Qty: 9 | Fill #0

## 2020-08-21 MED FILL — SHINGRIX 50 MCG SUS: 50 | 1 days supply | Qty: 1 | Fill #0

## 2020-08-21 NOTE — Addendum Note (Signed)
Addended by: Elie Confer on: 08/21/2020 10:46 AM   Modules accepted: Orders

## 2020-08-21 NOTE — Patient Instructions (Addendum)
Thank you for visiting Dr. Valeta Harms at [facility]. Today we recommend the following:  Regional allergy panel with IgE level. Patient has blood work to be drawn prior to treatments next week.  Lab work can be sent off at this time so they can draw blood from her port and not have an additional stick today.  Would consider starting over-the-counter antihistamine such as Claritin Allegra or Zyrtec.  Samples of Breztri.  If you like this better we can switch her from Symbicort to this.  Maybe get better control of your symptoms.  Meds ordered this encounter  Medications  . budesonide-formoterol (SYMBICORT) 160-4.5 MCG/ACT inhaler    Sig: Inhale 2 puffs into the lungs 2 (two) times daily. Rinse mouth after each use.    Dispense:  1 each    Refill:  6  . albuterol (VENTOLIN HFA) 108 (90 Base) MCG/ACT inhaler    Sig: Inhale 1-2 puffs into the lungs every 6 (six) hours as needed for wheezing or shortness of breath.    Dispense:  18 g    Refill:  6   Return in about 3 months (around 11/18/2020) for with APP or Dr. Valeta Harms.    Please do your part to reduce the spread of COVID-19.

## 2020-08-21 NOTE — Progress Notes (Signed)
Synopsis: Referred in November 2020 for ?wheezing by Dr. Burr Medico, PCP: [name], DO  Subjective:   PATIENT ID: Teresa Hays GENDER: female DOB: 1956/04/04, MRN: [id number]  Chief Complaint  Patient presents with  . Follow-up    PFT 07/24/2020    PMH breast cancer, metastatic in 2019, currently on trastuzumab plus Pertuzumab.  She presents today to the office with complaints of questionable wheezing.  This is predominantly seen at nighttime.  She is accompanied today in the office by her daughter.  She does not really describe shortness of breath with exertion but she is fatigued because of everything that is going on and treatments currently for her chemo for cancer.  The noise that they hear is usually at nighttime while she is sleeping.  And when the patient is asked to describe she is able to recreate the noise through forced exhalation.  She does not necessarily have chest symptoms during this time.  Patient denies any significant allergies.  She denies hemoptysis.  She does state that her nasal passages have been extremely dry and she always uses a space heater.  She occasionally has bloody noses because of this.  Patient was given an albuterol inhaler.  She states when she uses this it does not make any difference in her symptoms.  Patient also is experiencing cough and gastroesophageal reflux symptoms.  OV 08/21/2020: Patient last seen in our office on 04/02/2020, Derl Barrow, NP.  Was given triple therapy inhaler regimen with some improvement of symptoms.  Blood work with elevated eosinophils at 400.  Still had ongoing cough on 07/25/2020 called into the office.  Recent lab work cancer center on 08/15/2020 reviewed.  Seen by Dr. Burr Medico oncology.  Also had recent evaluation by cardiology heart failure service.  Office note reviewed.  Prior echocardiograms reviewed planning for continued follow-up with echocardiogram every 3 to 4 months to ensure no chemotherapy effects on heart function.   Office note from 08/20/2018 to reviewed Dr. Haroldine Laws.  Patient had an exacerbation last month that required prednisone.  Doing much better since then no longer wheezing.  Symptoms are better.  Using albuterol rarely.  Currently managed with Symbicort.  She states that she usually has some type of flare at this point about every other month.  We reviewed her pulmonary function test today in the office which revealed a reduced spirometry no significant bronchodilator response, elevated RV/TLC consistent with air trapping.  Normal DLCO.    Past Medical History:  Diagnosis Date  . GERD (gastroesophageal reflux disease)   . Hypertension   . Personal history of chemotherapy   . rt breast ca with mets to liver dx'd 03/2018     Family History  Problem Relation Age of Onset  . Diabetes Mother   . Cancer Sister 60       breast cancer  . Breast cancer Sister   . Rheum arthritis Sister      Past Surgical History:  Procedure Laterality Date  . BREAST BIOPSY Right 03/24/2018   CA x3  . BREAST BIOPSY Left 03/28/2018   neg  . BREAST BIOPSY Left 03/30/2018   neg  . COLONOSCOPY    . ESOPHAGOGASTRODUODENOSCOPY ENDOSCOPY    . IR IMAGING GUIDED PORT INSERTION  04/04/2018    Social History   Socioeconomic History  . Marital status: Single    Spouse name: Not on file  . Number of children: Not on file  . Years of education: Not on file  . Highest  education level: Not on file  Occupational History  . Not on file  Tobacco Use  . Smoking status: Never Smoker  . Smokeless tobacco: Never Used  Vaping Use  . Vaping Use: Never used  Substance and Sexual Activity  . Alcohol use: No  . Drug use: Never  . Sexual activity: Not on file  Other Topics Concern  . Not on file  Social History Narrative  . Not on file   Social Determinants of Health   Financial Resource Strain: Not on file  Food Insecurity: Not on file  Transportation Needs: Not on file  Physical Activity: Not on file  Stress:  Not on file  Social Connections: Not on file  Intimate Partner Violence: Not on file     No Known Allergies   Outpatient Medications Prior to Visit  Medication Sig Dispense Refill  . albuterol (VENTOLIN HFA) 108 (90 Base) MCG/ACT inhaler Inhale 1-2 puffs into the lungs every 6 (six) hours as needed for wheezing or shortness of breath.    Marland Kitchen amLODipine (NORVASC) 5 MG tablet TAKE 1 TABLET BY MOUTH DAILY. 30 tablet 2  . budesonide-formoterol (SYMBICORT) 160-4.5 MCG/ACT inhaler Inhale 2 puffs into the lungs 2 (two) times daily. Rinse mouth after each use. 1 Inhaler 6  . carvedilol (COREG) 3.125 MG tablet TAKE 1 TABLET BY MOUTH TWICE DAILY. REPLACES ATENOLOL 180 tablet 3  . diphenoxylate-atropine (LOMOTIL) 2.5-0.025 MG tablet Take 1-2 tablets by mouth 4 (four) times daily as needed for diarrhea or loose stools. 60 tablet 1  . DULoxetine (CYMBALTA) 20 MG capsule TAKE 2 CAPSULES BY MOUTH DAILY 60 capsule 3  . gabapentin (NEURONTIN) 300 MG capsule May take up to 3 capsules three times daily 270 capsule 3  . lidocaine (XYLOCAINE) 2 % jelly Apply 1 application topically as needed.    Marland Kitchen losartan (COZAAR) 50 MG tablet Take 2 tablets (100 mg total) by mouth daily. 90 tablet 3  . montelukast (SINGULAIR) 10 MG tablet TAKE 1 TABLET (10 MG TOTAL) BY MOUTH AT BEDTIME. 30 tablet 5  . omeprazole (PRILOSEC) 40 MG capsule TAKE 1 CAPSULE BY MOUTH TWICE DAILY 60 capsule 3  . ondansetron (ZOFRAN) 8 MG tablet Take 1 tablet (8 mg total) by mouth every 8 (eight) hours as needed for nausea or vomiting. 30 tablet 3  . potassium chloride SA (KLOR-CON) 20 MEQ tablet TAKE 1 TABLET BY MOUTH ONCE DAILY 30 tablet 3  . predniSONE (DELTASONE) 10 MG tablet 4 x 4 days, 3 x 4 days, 2 x 4 days, 1 x 4 days then stop 40 tablet 0  . prochlorperazine (COMPAZINE) 10 MG tablet Take 1 tablet (10 mg total) by mouth every 6 (six) hours as needed for nausea or vomiting. 30 tablet 3  . rivaroxaban (XARELTO) 20 MG TABS tablet TAKE 1 TABLET BY  MOUTH DAILY WITH SUPPER 30 tablet 5  . spironolactone (ALDACTONE) 25 MG tablet TAKE 1 TABLET BY MOUTH ONCE DAILY 90 tablet 3  . traMADol (ULTRAM) 50 MG tablet Take 1 tablet (50 mg total) by mouth every 6 (six) hours as needed for moderate pain or severe pain. 30 tablet 0   Facility-Administered Medications Prior to Visit  Medication Dose Route Frequency Provider Last Rate Last Admin  . sodium chloride flush (NS) 0.9 % injection 10 mL  10 mL Intracatheter PRN Malachy Mood, MD   10 mL at 05/28/20 0935    Review of Systems  Constitutional: Negative for chills, fever, malaise/fatigue and weight loss.  HENT:  Negative for hearing loss, sore throat and tinnitus.   Eyes: Negative for blurred vision and double vision.  Respiratory: Positive for cough, shortness of breath and wheezing. Negative for hemoptysis, sputum production and stridor.   Cardiovascular: Negative for chest pain, palpitations, orthopnea, leg swelling and PND.  Gastrointestinal: Negative for abdominal pain, constipation, diarrhea, heartburn, nausea and vomiting.  Genitourinary: Negative for dysuria, hematuria and urgency.  Musculoskeletal: Negative for joint pain and myalgias.  Skin: Negative for itching and rash.  Neurological: Negative for dizziness, tingling, weakness and headaches.  Endo/Heme/Allergies: Negative for environmental allergies. Does not bruise/bleed easily.  Psychiatric/Behavioral: Negative for depression. The patient is not nervous/anxious and does not have insomnia.   All other systems reviewed and are negative.    Objective:  Physical Exam Vitals reviewed.  Constitutional:      General: She is not in acute distress.    Appearance: She is well-developed and well-nourished.  HENT:     Head: Normocephalic and atraumatic.     Mouth/Throat:     Mouth: Oropharynx is clear and moist.  Eyes:     General: No scleral icterus.    Conjunctiva/sclera: Conjunctivae normal.     Pupils: Pupils are equal, round, and  reactive to light.  Neck:     Vascular: No JVD.     Trachea: No tracheal deviation.  Cardiovascular:     Rate and Rhythm: Normal rate and regular rhythm.     Pulses: Intact distal pulses.     Heart sounds: Normal heart sounds. No murmur heard.   Pulmonary:     Effort: Pulmonary effort is normal. No tachypnea, accessory muscle usage or respiratory distress.     Breath sounds: Normal breath sounds. No stridor. No wheezing, rhonchi or rales.  Abdominal:     General: Bowel sounds are normal. There is no distension.     Palpations: Abdomen is soft.     Tenderness: There is no abdominal tenderness.  Musculoskeletal:        General: No tenderness.     Cervical back: Neck supple.     Right lower leg: Edema present.     Left lower leg: Edema present.  Lymphadenopathy:     Cervical: No cervical adenopathy.  Skin:    General: Skin is warm and dry.     Capillary Refill: Capillary refill takes less than 2 seconds.     Findings: No rash.  Neurological:     Mental Status: She is alert and oriented to person, place, and time.  Psychiatric:        Mood and Affect: Mood and affect normal.        Behavior: Behavior normal.      Vitals:   08/21/20 1017  BP: 120/84  Pulse: 77  SpO2: 100%  Weight: 144 lb 3.2 oz (65.4 kg)  Height: $Remove'5\' 4"'mKUahNZ$  (1.626 m)   100% on RA BMI Readings from Last 3 Encounters:  08/21/20 24.75 kg/m  08/20/20 24.75 kg/m  08/15/20 24.33 kg/m   Wt Readings from Last 3 Encounters:  08/21/20 144 lb 3.2 oz (65.4 kg)  08/20/20 144 lb 3.2 oz (65.4 kg)  08/15/20 141 lb 12 oz (64.3 kg)     CBC    Component Value Date/Time   WBC 7.3 08/15/2020 1435   WBC 3.2 (L) 04/25/2018 2030   RBC 3.86 (L) 08/15/2020 1435   HGB 11.0 (L) 08/15/2020 1435   HCT 33.9 (L) 08/15/2020 1435   HCT 34.2 01/04/2020 0809   PLT 157 08/15/2020  1435   MCV 87.8 08/15/2020 1435   MCH 28.5 08/15/2020 1435   MCHC 32.4 08/15/2020 1435   RDW 13.8 08/15/2020 1435   LYMPHSABS 2.2 08/15/2020  1435   MONOABS 0.2 08/15/2020 1435   EOSABS 0.2 08/15/2020 1435   BASOSABS 0.0 08/15/2020 1435    Chest Imaging: 05/15/2019 CT scan of the chest: CTA of the chest abdomen pelvis revealing stable adenopathy and hepatic lesions consistent with her metastatic breast cancer. Lung parenchyma reviewed.  No obvious interstitial disease. The patient's images have been independently reviewed by me.    CT chest 07/28/2020: Small pulmonary nodule no significant infiltrates lung parenchyma looks otherwise unremarkable. The patient's images have been independently reviewed by me.    Pulmonary Functions Testing Results: PFT Results Latest Ref Rng & Units 07/24/2020  FVC-Pre L 1.42  FVC-Predicted Pre % 55  FVC-Post L 1.34  FVC-Predicted Post % 52  Pre FEV1/FVC % % 80  Post FEV1/FCV % % 84  FEV1-Pre L 1.13  FEV1-Predicted Pre % 56  FEV1-Post L 1.13  DLCO uncorrected ml/min/mmHg 17.46  DLCO UNC% % 87  DLCO corrected ml/min/mmHg 19.90  DLCO COR %Predicted % 99  DLVA Predicted % 171  TLC L 4.46  TLC % Predicted % 88  RV % Predicted % 138    FeNO: none   Pathology: Breast CA   Echocardiogram:   IMPRESSIONS  1. Left ventricular ejection fraction, by estimation, is 55 to 60%. The  left ventricle has normal function. The left ventricle has no regional  wall motion abnormalities. Left ventricular diastolic parameters are  consistent with Grade I diastolic  dysfunction (impaired relaxation). The average left ventricular global  longitudinal strain is -19.4 %.  2. Right ventricular systolic function is normal. The right ventricular  size is normal.  3. The mitral valve is normal in structure. Trivial mitral valve  regurgitation. No evidence of mitral stenosis.  4. The aortic valve is normal in structure. Aortic valve regurgitation is  not visualized. No aortic stenosis is present.  5. The inferior vena cava is normal in size with greater than 50%  respiratory variability, suggesting  right atrial pressure of 3 mmHg.   Heart Catheterization: None     Assessment & Plan:     ICD-10-CM   1. Moderate persistent asthma without complication  J17.91   2. Seasonal allergies  J30.2   3. History of pulmonary embolism  Z86.711   4. Metastatic breast cancer ([place])  C50.919   5. 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 Liver masses  R16.0   7. Eosinophils increased  R89.8     Discussion:  This is a 65 year old female, metastatic breast cancer, HER-2 positive, PR positive, ER positive, currently on chemotherapy with trastuzumab plus Pertuzumab.  Saw me in the office initially for upper airway symptoms cough and wheezing.  She had postnasal drip and congestion at the time which is better.  She has subsequently had additional flares of the chest tightness and wheezing.  She responds to albuterol and steroids.  Pulmonary function test with reduced spirometry and evidence of air trapping.  I think her overall picture is consistent with moderate persistent asthma.  She is doing much better on her current inhaler regimen but still having breakthrough symptoms.  Her reflux is better controlled on PPI.  And she is doing well with Singulair daily for allergy control but still has some breakthrough.  Plan: We will recommend triple therapy  inhaler regimen. Start patient on Judithann Sauger If she does better with this then we can move from Symbicort. Would like to prevent her from needing recurrent bouts of steroid use. We will also check regional allergy panel plus IgE, Rast panel. Blood draw can be completed prior to her next chemo treatment so she does not have to get stuck today in the office they can pull blood from her port. Recommend starting over-the-counter antihistamine.  Such as Claritin or Allegra  Return to clinic to see [place] in 3 months.   Current Outpatient Medications:  .  albuterol (VENTOLIN HFA) 108 (90 Base) MCG/ACT inhaler, Inhale 1-2 puffs into  the lungs every 6 (six) hours as needed for wheezing or shortness of breath., Disp: , Rfl:  .  amLODipine (NORVASC) 5 MG tablet, TAKE 1 TABLET BY MOUTH DAILY., Disp: 30 tablet, Rfl: 2 .  budesonide-formoterol (SYMBICORT) 160-4.5 MCG/ACT inhaler, Inhale 2 puffs into the lungs 2 (two) times daily. Rinse mouth after each use., Disp: 1 Inhaler, Rfl: 6 .  carvedilol (COREG) 3.125 MG tablet, TAKE 1 TABLET BY MOUTH TWICE DAILY. REPLACES ATENOLOL, Disp: 180 tablet, Rfl: 3 .  diphenoxylate-atropine (LOMOTIL) 2.5-0.025 MG tablet, Take 1-2 tablets by mouth 4 (four) times daily as needed for diarrhea or loose stools., Disp: 60 tablet, Rfl: 1 .  DULoxetine (CYMBALTA) 20 MG capsule, TAKE 2 CAPSULES BY MOUTH DAILY, Disp: 60 capsule, Rfl: 3 .  gabapentin (NEURONTIN) 300 MG capsule, May take up to 3 capsules three times daily, Disp: 270 capsule, Rfl: 3 .  lidocaine (XYLOCAINE) 2 % jelly, Apply 1 application topically as needed., Disp: , Rfl:  .  losartan (COZAAR) 50 MG tablet, Take 2 tablets (100 mg total) by mouth daily., Disp: 90 tablet, Rfl: 3 .  montelukast (SINGULAIR) 10 MG tablet, TAKE 1 TABLET (10 MG TOTAL) BY MOUTH AT BEDTIME., Disp: 30 tablet, Rfl: 5 .  omeprazole (PRILOSEC) 40 MG capsule, TAKE 1 CAPSULE BY MOUTH TWICE DAILY, Disp: 60 capsule, Rfl: 3 .  ondansetron (ZOFRAN) 8 MG tablet, Take 1 tablet (8 mg total) by mouth every 8 (eight) hours as needed for nausea or vomiting., Disp: 30 tablet, Rfl: 3 .  potassium chloride SA (KLOR-CON) 20 MEQ tablet, TAKE 1 TABLET BY MOUTH ONCE DAILY, Disp: 30 tablet, Rfl: 3 .  predniSONE (DELTASONE) 10 MG tablet, 4 x 4 days, 3 x 4 days, 2 x 4 days, 1 x 4 days then stop, Disp: 40 tablet, Rfl: 0 .  prochlorperazine (COMPAZINE) 10 MG tablet, Take 1 tablet (10 mg total) by mouth every 6 (six) hours as needed for nausea or vomiting., Disp: 30 tablet, Rfl: 3 .  rivaroxaban (XARELTO) 20 MG TABS tablet, TAKE 1 TABLET BY MOUTH DAILY WITH SUPPER, Disp: 30 tablet, Rfl: 5 .   spironolactone (ALDACTONE) 25 MG tablet, TAKE 1 TABLET BY MOUTH ONCE DAILY, Disp: 90 tablet, Rfl: 3 .  traMADol (ULTRAM) 50 MG tablet, Take 1 tablet (50 mg total) by mouth every 6 (six) hours as needed for moderate pain or severe pain., Disp: 30 tablet, Rfl: 0 No current facility-administered medications for this visit.  Facility-Administered Medications Ordered in Other Visits:  .  sodium chloride flush (NS) 0.9 % injection 10 mL, 10 mL, Intracatheter, PRN, [name], MD, 10 mL at 05/28/20 0935  I spent 40 minutes dedicated to the care of this patient on the date of this encounter to include pre-visit review of records, face-to-face time with the patient discussing conditions above, post visit ordering of  testing, clinical documentation with the electronic health record, making appropriate referrals as documented, and communicating necessary findings to members of the patients care team.    Garner Nash, DO [facility] 08/21/2020 10:22 AM

## 2020-08-21 NOTE — Addendum Note (Signed)
Addended by: Elie Confer on: 08/21/2020 11:04 AM   Modules accepted: Orders

## 2020-08-22 ENCOUNTER — Inpatient Hospital Stay: Payer: Medicaid Other

## 2020-08-22 ENCOUNTER — Encounter: Payer: Self-pay | Admitting: Hematology

## 2020-08-22 ENCOUNTER — Inpatient Hospital Stay: Payer: Medicaid Other | Attending: Hematology | Admitting: Hematology

## 2020-08-22 DIAGNOSIS — T451X5A Adverse effect of antineoplastic and immunosuppressive drugs, initial encounter: Secondary | ICD-10-CM | POA: Diagnosis not present

## 2020-08-22 DIAGNOSIS — N179 Acute kidney failure, unspecified: Secondary | ICD-10-CM | POA: Insufficient documentation

## 2020-08-22 DIAGNOSIS — C50811 Malignant neoplasm of overlapping sites of right female breast: Secondary | ICD-10-CM | POA: Insufficient documentation

## 2020-08-22 DIAGNOSIS — D6481 Anemia due to antineoplastic chemotherapy: Secondary | ICD-10-CM | POA: Diagnosis not present

## 2020-08-22 DIAGNOSIS — C787 Secondary malignant neoplasm of liver and intrahepatic bile duct: Secondary | ICD-10-CM | POA: Insufficient documentation

## 2020-08-22 DIAGNOSIS — J45909 Unspecified asthma, uncomplicated: Secondary | ICD-10-CM | POA: Insufficient documentation

## 2020-08-22 DIAGNOSIS — C773 Secondary and unspecified malignant neoplasm of axilla and upper limb lymph nodes: Secondary | ICD-10-CM | POA: Insufficient documentation

## 2020-08-22 DIAGNOSIS — N189 Chronic kidney disease, unspecified: Secondary | ICD-10-CM | POA: Insufficient documentation

## 2020-08-22 DIAGNOSIS — Z79899 Other long term (current) drug therapy: Secondary | ICD-10-CM | POA: Insufficient documentation

## 2020-08-22 DIAGNOSIS — I700 Atherosclerosis of aorta: Secondary | ICD-10-CM | POA: Insufficient documentation

## 2020-08-22 DIAGNOSIS — C50919 Malignant neoplasm of unspecified site of unspecified female breast: Secondary | ICD-10-CM

## 2020-08-22 DIAGNOSIS — K219 Gastro-esophageal reflux disease without esophagitis: Secondary | ICD-10-CM | POA: Insufficient documentation

## 2020-08-22 DIAGNOSIS — M79671 Pain in right foot: Secondary | ICD-10-CM | POA: Insufficient documentation

## 2020-08-22 DIAGNOSIS — Z5112 Encounter for antineoplastic immunotherapy: Secondary | ICD-10-CM | POA: Insufficient documentation

## 2020-08-22 DIAGNOSIS — K449 Diaphragmatic hernia without obstruction or gangrene: Secondary | ICD-10-CM | POA: Insufficient documentation

## 2020-08-22 DIAGNOSIS — G620 Drug-induced polyneuropathy: Secondary | ICD-10-CM | POA: Insufficient documentation

## 2020-08-22 DIAGNOSIS — K8020 Calculus of gallbladder without cholecystitis without obstruction: Secondary | ICD-10-CM | POA: Insufficient documentation

## 2020-08-22 DIAGNOSIS — Z9221 Personal history of antineoplastic chemotherapy: Secondary | ICD-10-CM | POA: Insufficient documentation

## 2020-08-25 MED FILL — MONTELUKAST SOD 10 MG TAB: 10 | 30 days supply | Qty: 30 | Fill #3

## 2020-08-27 NOTE — Progress Notes (Signed)
[place]   [phone] [phone]) [phone]   Clinic Follow up Note   Patient Care Team: [name], DO as PCP - General (Family Medicine) [name], MD as Consulting Physician (Gastroenterology) [name], MD as Consulting Physician (Hematology)  Date of Service:  08/29/2020  CHIEF COMPLAINT: F/u for metastatic breast cancer  SUMMARY OF ONCOLOGIC HISTORY: Oncology History Overview Note  Cancer Staging Metastatic breast cancer [facility]) Staging form: Breast, AJCC 8th Edition - Clinical stage from 03/24/2018: Stage IV (cT2, cN1, pM1, G3, ER+, PR+, HER2+) - Signed by [name], MD on 03/30/2018     Metastatic breast cancer ([place])  01/30/2018 Procedure   Colonoscopy showed small polyp in the sigmoid colon, removed, the exam of colon including the terminal ileum was otherwise negative.   01/30/2018 Procedure   EGD by Dr. Collene Mares showed small hiatal hernia, a 8 mm polypoid lesion in the cardia, biopsied.   03/09/2018 Imaging   03/09/2018 US Abdomen IMPRESSION: 1. Mass lesions throughout the liver, consistent with metastatic disease. Liver as a somewhat nodular contour suggesting underlying hepatic cirrhosis. Inhomogeneous echotexture to the liver.  2. Cholelithiasis with mild gallbladder wall thickening. A degree of cholecystitis cannot be excluded by ultrasound.  3. Portions of pancreas obscured by gas. Visualized portions of pancreas appear normal.  4. Small right kidney. Etiology uncertain. This finding potentially may be indicative of renal artery stenosis. In this regard, question whether patient is hypertensive.   03/15/2018 Imaging   CT CAP with contrast  IMPRESSION: 1. Widespread hepatic metastasis. 2. 2.6 cm lateral right breast soft tissue nodule could represent a breast primary or an incidental benign lesion. Consider correlation with mammogram and ultrasound. 3. No definite source of primary malignancy identified within the abdomen or  pelvis. There is possible rectosigmoid junction wall thickening. Consider colonoscopy with attention to this area. 4. Distal esophageal wall thickening, suggesting esophagitis.   03/24/2018 Cancer Staging   Staging form: Breast, AJCC 8th Edition - Clinical stage from 03/24/2018: Stage IV (cT2, cN1, pM1, G3, ER+, PR+, HER2+) - Signed by [name], MD on 03/30/2018   03/24/2018 Initial Biopsy   Diagnosis 1. 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 Breast, right, needle core biopsy, 9 o'clock, 7cm from nipple - INVASIVE DUCTAL CARCINOMA. -The carcinoma is somewhat morphologically dissimilar from that in part 1. It appears grade III 3. Lymph node, needle/core biopsy, right axillary - METASTATIC CARCINOMA IN 1 OF 1 LYMPH NODE (1/1).   03/24/2018 Receptors her2   Breast biopsy: 1. Estrogen Receptor: 40%, POSITIVE, STRONG-MODERATE STAINING INTENSITY Progesterone Receptor: 70%, POSITIVE, STRONG STAINING INTENSITY Proliferation Marker Ki67: 20% HER 2 equivocal by IHC 2+, POSITIVE by FISH, ratio 2.4 and copy #4.2  2. Estrogen Receptor: 60%, POSITIVE, MODERATE STAINING INTENSITY Progesterone Receptor: 40%, POSITIVE, MODERATE STAINING INTENSITY Proliferation Marker Ki67: 20% HER2 (+) by IHC 3+   03/24/2018 Initial Diagnosis   Metastatic breast cancer ([place])   03/27/2018 Pathology Results   Diagnosis Liver, needle/core biopsy, Right - METASTATIC CARCINOMA TO LIVER, CONSISTENT WITH PATIENTS CLINICAL HISTORY OF PRIMARY BREAST CARCINOMA.  ER 80%+ PR40%+ HER2- (by [facility], IHC 2+)  Ki67 50%    03/28/2018 Pathology Results   03/28/2018 Surgical Pathology Diagnosis 1. Breast, left, needle core biopsy, 9 o'clock - FIBROCYSTIC CHANGES. - THERE IS NO EVIDENCE OF MALIGNANCY. 2. Breast, left, needle core biopsy, 2 o'clock - FIBROADENOMA. - THERE IS NO EVIDENCE OF  MALIGNANCY. - SEE COMMENT.   03/29/2018 Imaging    03/29/2018 Bone Scan IMPRESSION: No scintigraphic evidence of osseous metastatic disease.   03/30/2018 Imaging   Bone scan  IMPRESSION: No scintigraphic evidence of osseous metastatic disease.    04/07/2018 - 07/30/2020 Chemotherapy   First line chemo weekly Taxol and herceptin/Perjeta every 3 weeks starting 04/07/18. She developed infusion reaction to taxol and it was discontinued. Added Abraxane on C1D8, 2 weeks on/1 week off.  Abraxne stopped after 10/13/18 due to worsening Neuropathy. She has continued with maintenance Herceptin injection/Perjeta q3weeks. Herceptin changed to injection on 04/06/19. Both injections switched to combination [place] on 10/01/19. ----Stopped 07/30/20 due to disease progression in liver.    06/06/2018 Imaging   CT CAP IMPRESSION: 1. Generally improved appearance, with reduced axillary adenopathy and reduced enhancing component of the hepatic masses, with some of the hepatic mass is moderately smaller than on the prior exam. Reduced size of the right lateral breast mass compared to the prior 03/15/2018 exam. 2. New mild interstitial accentuation in the lungs, significance uncertain. Part of this appearance may be due to lower lung volumes on today's exam. 3. Mild wall thickening in the descending colon and upper rectum suggesting low-grade colitis/inflammation. Prominent stool throughout the colon favors constipation. 4. Other imaging findings of potential clinical significance: Aortic Atherosclerosis (ICD10-I70.0). Mild cardiomegaly. Mild nodularity in the right lower lobe appears stable. Contracted and thick-walled gallbladder.    09/18/2018 Imaging   CT CAP W Contrast 09/18/18  IMPRESSION: 1. Liver metastases have decreased in size. 2. Right breast mass, mild right axillary lymphadenopathy and scattered tiny right pulmonary nodules are all stable. 3. New mild left supraclavicular and left subpectoral lymphadenopathy, can not exclude progression of metastatic  nodal disease. 4. Moderate colorectal stool volume, which may indicate constipation. 5.  Aortic Atherosclerosis (ICD10-I70.0).   10/2018 - 07/30/2020 Anti-estrogen oral therapy   Letrozole 2.5 mg daily starting 10/2018 D/c to proceed with Inhertu treatment after liver met progression.    01/10/2019 Imaging   CT CAP W Contrast 01/10/19  IMPRESSION: 1. Continued improvement in the hepatic metastatic lesions which have reduced in size. 2. Stable mild left supraclavicular and subpectoral adenopathy. 3. Essentially stable small right lower lobe pulmonary nodule and separate small subpleural nodule along the right hemidiaphragm. Surveillance suggested. 4. Other imaging findings of potential clinical significance: Mild cardiomegaly. Mild circumferential distal esophageal wall thickening, the most common cause would be esophagitis. Airway thickening is present, suggesting bronchitis or reactive airways disease. Airway plugging in the lower lobes and in the right middle lobe. Stable small right adrenal adenoma.   05/15/2019 Imaging   CT CAP W Contrast 05/15/19  IMPRESSION: CT CHEST IMPRESSION   1. Similar appearance of borderline supraclavicular, axillary, and subpectoral adenopathy. 2. Improved and resolved right lower lobe pulmonary nodularity. 3. Esophageal air fluid level suggests dysmotility or gastroesophageal reflux.   CT ABDOMEN AND PELVIS IMPRESSION   1. Improved hepatic metastasis. 2. Small bowel mesenteric lymph nodes which are upper normal and mildly enlarged. Likely increased and similar as detailed above. Indeterminate. Recommend attention on follow-up. 3. Cholelithiasis. 4. Motion degradation throughout the lower chest and abdomen.   09/19/2019 Imaging   CT CAP W contrast  IMPRESSION: 1. Interval decrease in size of right axillary and subpectoral lymph nodes. There are no pathologically enlarged lymph nodes remaining in the chest, abdomen, or pelvis. No new  lymphadenopathy. 2. No significant change in post treatment appearance of multiple low-attenuation liver lesions, in keeping with treated metastases.  3. No evidence of new metastatic disease in the chest, abdomen, or pelvis. 4. Aortic Atherosclerosis (ICD10-I70.0).   11/16/2019 Imaging   IMRI Brain  MPRESSION: Minimal chronic microvascular ischemic changes. No acute intracranial process.   Active bilateral maxillary sinus disease with mild frontoethmoid mucosal thickening.   01/23/2020 Imaging   CT CAP W contrast  IMPRESSION: 1. Stable exam. No new or progressive interval findings. 2. Stable appearance of upper normal right axillary lymph nodes. Tiny subpectoral and left axillary nodes are unchanged. 3. Generally similar appearance of ill-defined hypoattenuating lesions in the liver compatible with treated metastases. 1 lesion in the central liver is minimally more conspicuous today, likely related to bolus timing and attention on follow-up recommended. No new suspicious liver lesion on today's study. 4. Stable 10 mm right adrenal nodule., indeterminate. Continued attention on follow-up imaging recommended. 5. Cholelithiasis. 6. Aortic Atherosclerosis (ICD10-I70.0).   02/11/2020 Breast MRI   IMPRESSION: 1. 7 millimeter focus of residual enhancement associated with the known malignancy in the 9 o'clock location of the RIGHT breast. 2. No significant enhancement in the known malignancy in the 11:30 o'clock location of the RIGHT breast. 3. Interval resolution of axillary adenopathy.   07/28/2020 Imaging   CT CAP  IMPRESSION: 1. Interval progression of hepatic metastases. 2. Stable indeterminate right adrenal nodule. 3. No new sites of metastatic disease in the chest, abdomen or pelvis. 4. Chronic findings include: Small hiatal hernia. Cholelithiasis. Aortic Atherosclerosis (ICD10-I70.0).     08/08/2020 -  Chemotherapy   Second-line Inhertu q3weeks starting 08/08/20        CURRENT THERAPY:  Second-lineEnhertu q3weeks starting1/21/22. Due to poor toleration with C1, she required dose reduction from C2.   INTERVAL HISTORY:  Teresa Hays is here for a follow up. She presents to the clinic alone. She notes she feels much better. She notes IV Fluids have helped. She notes she has been able to eat and drink at baseline again. She notes her breathing is adequate. I reviewed her medication list. She is no longer on Symbicort.    REVIEW OF SYSTEMS:   Constitutional: Denies fevers, chills or abnormal weight loss Eyes: Denies blurriness of vision Ears, nose, mouth, throat, and face: Denies mucositis or sore throat Respiratory: Denies cough, dyspnea or wheezes Cardiovascular: Denies palpitation, chest discomfort or lower extremity swelling Gastrointestinal:  Denies nausea, heartburn or change in bowel habits Skin: Denies abnormal skin rashes Lymphatics: Denies new lymphadenopathy or easy bruising Neurological:Denies numbness, tingling or new weaknesses Behavioral/Psych: Mood is stable, no new changes  All other systems were reviewed with the patient and are negative.  MEDICAL HISTORY:  Past Medical History:  Diagnosis Date  . GERD (gastroesophageal reflux disease)   . Hypertension   . Personal history of chemotherapy   . rt breast ca with mets to liver dx'd 03/2018    SURGICAL HISTORY: Past Surgical History:  Procedure Laterality Date  . BREAST BIOPSY Right 03/24/2018   CA x3  . BREAST BIOPSY Left 03/28/2018   neg  . BREAST BIOPSY Left 03/30/2018   neg  . COLONOSCOPY    . ESOPHAGOGASTRODUODENOSCOPY ENDOSCOPY    . IR IMAGING GUIDED PORT INSERTION  04/04/2018    I have reviewed the social history and family history with the patient and they are unchanged from previous note.  ALLERGIES:  has No Known Allergies.  MEDICATIONS:  Current Outpatient Medications  Medication Sig Dispense Refill  . albuterol (VENTOLIN HFA) 108 (90 Base)  MCG/ACT inhaler Inhale 1-2 puffs into the  lungs every 6 (six) hours as needed for wheezing or shortness of breath. 18 g 6  . amLODipine (NORVASC) 5 MG tablet TAKE 1 TABLET BY MOUTH DAILY. 30 tablet 2  . Budeson-Glycopyrrol-Formoterol (BREZTRI AEROSPHERE) 160-9-4.8 MCG/ACT AERO Inhale 2 puffs into the lungs in the morning and at bedtime. 8 g 0  . carvedilol (COREG) 3.125 MG tablet TAKE 1 TABLET BY MOUTH TWICE DAILY. REPLACES ATENOLOL 180 tablet 3  . diphenoxylate-atropine (LOMOTIL) 2.5-0.025 MG tablet Take 1-2 tablets by mouth 4 (four) times daily as needed for diarrhea or loose stools. 60 tablet 1  . DULoxetine (CYMBALTA) 20 MG capsule TAKE 2 CAPSULES BY MOUTH DAILY 60 capsule 3  . gabapentin (NEURONTIN) 300 MG capsule May take up to 3 capsules three times daily 270 capsule 3  . lidocaine (XYLOCAINE) 2 % jelly Apply 1 application topically as needed.    Marland Kitchen losartan (COZAAR) 50 MG tablet Take 2 tablets (100 mg total) by mouth daily. 90 tablet 3  . montelukast (SINGULAIR) 10 MG tablet TAKE 1 TABLET (10 MG TOTAL) BY MOUTH AT BEDTIME. 30 tablet 5  . omeprazole (PRILOSEC) 40 MG capsule TAKE 1 CAPSULE BY MOUTH TWICE DAILY 60 capsule 3  . ondansetron (ZOFRAN) 8 MG tablet Take 1 tablet (8 mg total) by mouth every 8 (eight) hours as needed for nausea or vomiting. 30 tablet 3  . potassium chloride SA (KLOR-CON) 20 MEQ tablet TAKE 1 TABLET BY MOUTH ONCE DAILY 30 tablet 3  . predniSONE (DELTASONE) 10 MG tablet 4 x 4 days, 3 x 4 days, 2 x 4 days, 1 x 4 days then stop 40 tablet 0  . prochlorperazine (COMPAZINE) 10 MG tablet Take 1 tablet (10 mg total) by mouth every 6 (six) hours as needed for nausea or vomiting. 30 tablet 3  . rivaroxaban (XARELTO) 20 MG TABS tablet TAKE 1 TABLET BY MOUTH DAILY WITH SUPPER 30 tablet 5  . spironolactone (ALDACTONE) 25 MG tablet TAKE 1 TABLET BY MOUTH ONCE DAILY 90 tablet 3  . traMADol (ULTRAM) 50 MG tablet Take 1 tablet (50 mg total) by mouth every 6 (six) hours as needed for  moderate pain or severe pain. 30 tablet 0   No current facility-administered medications for this visit.   Facility-Administered Medications Ordered in Other Visits  Medication Dose Route Frequency Provider Last Rate Last Admin  . sodium chloride flush (NS) 0.9 % injection 10 mL  10 mL Intracatheter PRN Malachy Mood, MD   10 mL at 05/28/20 0935    PHYSICAL EXAMINATION: ECOG PERFORMANCE STATUS: 1 - Symptomatic but completely ambulatory  Vitals:   08/29/20 0914  BP: 134/83  Pulse: 78  Resp: 18  Temp: 97.7 F (36.5 C)  SpO2: 100%   Filed Weights   08/29/20 0914  Weight: 147 lb 4.8 oz (66.8 kg)    Due to COVID19 we will limit examination to appearance. Patient had no complaints.  GENERAL:alert, no distress and comfortable SKIN: skin color normal, no rashes or significant lesions EYES: normal, Conjunctiva are pink and non-injected, sclera clear  NEURO: alert & oriented x 3 with fluent speech   LABORATORY DATA:  I have reviewed the data as listed CBC Latest Ref Rng & Units 08/29/2020 08/15/2020 08/08/2020  WBC 4.0 - 10.5 K/uL 8.3 7.3 10.0  Hemoglobin 12.0 - 15.0 g/dL 1.1(H) 11.0(L) 10.4(L)  Hematocrit 36.0 - 46.0 % 30.9(L) 33.9(L) 32.5(L)  Platelets 150 - 400 K/uL 355 157 218     CMP Latest Ref Rng & Units  08/29/2020 08/15/2020 08/08/2020  Glucose 70 - 99 mg/dL 103(H) 126(H) 93  BUN 8 - 23 mg/dL 11 20 14   Creatinine 0.44 - 1.00 mg/dL 1.34(H) 1.77(H) 1.28(H)  Sodium 135 - 145 mmol/L 134(L) 128(L) 137  Potassium 3.5 - 5.1 mmol/L 4.3 5.0 3.9  Chloride 98 - 111 mmol/L 103 97(L) 102  CO2 22 - 32 mmol/L 24 24 25   Calcium 8.9 - 10.3 mg/dL 9.5 9.0 9.2  Total Protein 6.5 - 8.1 g/dL 6.7 6.8 6.6  Total Bilirubin 0.3 - 1.2 mg/dL <0.2(L) 0.6 0.4  Alkaline Phos 38 - 126 U/L 138(H) 171(H) 149(H)  AST 15 - 41 U/L 22 29 25   ALT 0 - 44 U/L 20 44 35      RADIOGRAPHIC STUDIES: I have personally reviewed the radiological images as listed and agreed with the findings in the report. No  results found.   ASSESSMENT & PLAN:  ABREA HENLE is a 65 y.o. female with   1. 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 Liver met was HER2-.  -Given the metastatic disease, her cancer is not curable but still treatable. -She had been treated withfirst linemaintenancetreatment with letrozole,trastuzumab and Perjeta, until recent disease progression on 07/28/20 CT CAP.  -I started her on second-lineEnhertuq3weeks starting 08/08/20. Given poor toleration with C1 (nausea, diarrhea, poor appetite), dose reduced with C2.  -Labs reviewed and adequate to proceed with C2 Enhertu today with dose reduced.  -Will give IV Fluids next week with toxicity check.   2.Peripheral neuropathy, grade 2, Right Foot pain,Secondarytochemotherapy -Ipreviouslystropped Abraxane on 10/13/18. -S/p PT and steroid injection her right foot pain is controlled. She still has mild tingling neuropathy in her toes an fingers. She has adequate function and denies falls. I discussed this can take time to resolve and may not fully resolve. -She will also continue OTC Calcium and Vit D,OralB12, Gabapentin 300mg 3 tabsTID andCymbalta to 40mg , Tramadol every other night for right foot pain.Well controlled. 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 Anemia, Secondary to chemo -Continue monitoring, will consider blood transfusion if hemoglobin less than 8. -Mild and stable.  5Social support, Goal of Care Discussion -She lives alone, has her sister and niece in [place]. Her daughter lives in a few hours away  -On Ativan PRN due to anxietyabout diagnosis and treatment -She is full  code  6.Asthma -She has been seen by Dr. Valeta Harms who notes this is related to her post nasal drip and her Acid reflux can contribute. -She is onPrilosec2 tabs BID,allergymedicationand nasal spray and inhalers.Her insurance denied Dexilant.  -Continue to f/u withDr Icard and Dr Collene Mares andpulmonary clinic. -Her breathing has been doing better. Continue to f/u with Dr Valeta Harms for better control.  7.Goal of care discussion  -We discussed the incurable nature of her cancer, and the overall poor prognosis, especially if she does not have good response to chemotherapy or progress on chemo -The patient understands the goal of care is palliative. -she is full code now  8. Nausea, diarrhea and anorexia -Secondary toEnhertu. She become very dehydrated with elevated Cr.  -Her diarrhea and nausea took a little over a week to resolve. She has been able to fully recover after IV Fluids. Will dose reduce treatment starting with C2 and continue supportive care with  imodium, lomotil and antiemetics.   9. Acute on chronic kidney disease -baseline Cr 1.1-1.2 -Cr went up to 1.77 after first cycle Enhertu due to diarrhea and dehydration -Cr improved today    Plan: -Labs reviewed and adequate to proceed with C2 Enhertu today with dose reduction  -Lab, Flush, IV Fluids and F/u on 2/14 -Lab, flush, F/u and Enhertu in 3 weeks    No problem-specific Assessment & Plan notes found for this encounter.   No orders of the defined types were placed in this encounter.  All questions were answered. The patient knows to call the clinic with any problems, questions or concerns. No barriers to learning was detected. The total time spent in the appointment was 30 minutes.     [name], MD 08/29/2020   I, Joslyn Devon, am acting as scribe for [name], MD.   I have reviewed the above documentation for accuracy and completeness, and I agree with the above.

## 2020-08-29 ENCOUNTER — Inpatient Hospital Stay (HOSPITAL_BASED_OUTPATIENT_CLINIC_OR_DEPARTMENT_OTHER): Payer: Medicaid Other | Admitting: Hematology

## 2020-08-29 ENCOUNTER — Other Ambulatory Visit: Payer: Self-pay

## 2020-08-29 ENCOUNTER — Inpatient Hospital Stay: Payer: Medicaid Other

## 2020-08-29 ENCOUNTER — Encounter: Payer: Self-pay | Admitting: Hematology

## 2020-08-29 VITALS — BP 134/83 | HR 78 | Temp 97.7°F | Resp 18 | Ht 64.0 in | Wt 147.3 lb

## 2020-08-29 DIAGNOSIS — J45909 Unspecified asthma, uncomplicated: Secondary | ICD-10-CM | POA: Diagnosis not present

## 2020-08-29 DIAGNOSIS — Z79899 Other long term (current) drug therapy: Secondary | ICD-10-CM | POA: Diagnosis not present

## 2020-08-29 DIAGNOSIS — C50919 Malignant neoplasm of unspecified site of unspecified female breast: Secondary | ICD-10-CM | POA: Diagnosis not present

## 2020-08-29 DIAGNOSIS — C787 Secondary malignant neoplasm of liver and intrahepatic bile duct: Secondary | ICD-10-CM | POA: Diagnosis not present

## 2020-08-29 DIAGNOSIS — G620 Drug-induced polyneuropathy: Secondary | ICD-10-CM | POA: Diagnosis not present

## 2020-08-29 DIAGNOSIS — D6481 Anemia due to antineoplastic chemotherapy: Secondary | ICD-10-CM | POA: Diagnosis not present

## 2020-08-29 DIAGNOSIS — I700 Atherosclerosis of aorta: Secondary | ICD-10-CM | POA: Diagnosis not present

## 2020-08-29 DIAGNOSIS — N179 Acute kidney failure, unspecified: Secondary | ICD-10-CM | POA: Diagnosis not present

## 2020-08-29 DIAGNOSIS — T451X5A Adverse effect of antineoplastic and immunosuppressive drugs, initial encounter: Secondary | ICD-10-CM | POA: Diagnosis not present

## 2020-08-29 DIAGNOSIS — K219 Gastro-esophageal reflux disease without esophagitis: Secondary | ICD-10-CM | POA: Diagnosis not present

## 2020-08-29 DIAGNOSIS — Z5112 Encounter for antineoplastic immunotherapy: Secondary | ICD-10-CM | POA: Diagnosis present

## 2020-08-29 DIAGNOSIS — C50811 Malignant neoplasm of overlapping sites of right female breast: Secondary | ICD-10-CM | POA: Diagnosis present

## 2020-08-29 DIAGNOSIS — Z95828 Presence of other vascular implants and grafts: Secondary | ICD-10-CM

## 2020-08-29 DIAGNOSIS — Z7189 Other specified counseling: Secondary | ICD-10-CM

## 2020-08-29 DIAGNOSIS — K8020 Calculus of gallbladder without cholecystitis without obstruction: Secondary | ICD-10-CM | POA: Diagnosis not present

## 2020-08-29 DIAGNOSIS — Z9221 Personal history of antineoplastic chemotherapy: Secondary | ICD-10-CM | POA: Diagnosis not present

## 2020-08-29 DIAGNOSIS — K449 Diaphragmatic hernia without obstruction or gangrene: Secondary | ICD-10-CM | POA: Diagnosis not present

## 2020-08-29 DIAGNOSIS — N189 Chronic kidney disease, unspecified: Secondary | ICD-10-CM | POA: Diagnosis not present

## 2020-08-29 DIAGNOSIS — M79671 Pain in right foot: Secondary | ICD-10-CM | POA: Diagnosis not present

## 2020-08-29 DIAGNOSIS — C773 Secondary and unspecified malignant neoplasm of axilla and upper limb lymph nodes: Secondary | ICD-10-CM | POA: Diagnosis not present

## 2020-08-29 LAB — CBC WITH DIFFERENTIAL (CANCER CENTER ONLY)
Abs Immature Granulocytes: 0.03 10*3/uL (ref 0.00–0.07)
Basophils Absolute: 0.1 10*3/uL (ref 0.0–0.1)
Basophils Relative: 1 %
Eosinophils Absolute: 0.2 10*3/uL (ref 0.0–0.5)
Eosinophils Relative: 3 %
HCT: 30.9 % — ABNORMAL LOW (ref 36.0–46.0)
Hemoglobin: 9.8 g/dL — ABNORMAL LOW (ref 12.0–15.0)
Immature Granulocytes: 0 %
Lymphocytes Relative: 26 %
Lymphs Abs: 2.1 10*3/uL (ref 0.7–4.0)
MCH: 28.2 pg (ref 26.0–34.0)
MCHC: 31.7 g/dL (ref 30.0–36.0)
MCV: 89 fL (ref 80.0–100.0)
Monocytes Absolute: 0.5 10*3/uL (ref 0.1–1.0)
Monocytes Relative: 6 %
Neutro Abs: 5.4 10*3/uL (ref 1.7–7.7)
Neutrophils Relative %: 64 %
Platelet Count: 355 10*3/uL (ref 150–400)
RBC: 3.47 MIL/uL — ABNORMAL LOW (ref 3.87–5.11)
RDW: 14.5 % (ref 11.5–15.5)
WBC Count: 8.3 10*3/uL (ref 4.0–10.5)
nRBC: 0 % (ref 0.0–0.2)

## 2020-08-29 LAB — CMP (CANCER CENTER ONLY)
ALT: 20 U/L (ref 0–44)
AST: 22 U/L (ref 15–41)
Albumin: 3.5 g/dL (ref 3.5–5.0)
Alkaline Phosphatase: 138 U/L — ABNORMAL HIGH (ref 38–126)
Anion gap: 7 (ref 5–15)
BUN: 11 mg/dL (ref 8–23)
CO2: 24 mmol/L (ref 22–32)
Calcium: 9.5 mg/dL (ref 8.9–10.3)
Chloride: 103 mmol/L (ref 98–111)
Creatinine: 1.34 mg/dL — ABNORMAL HIGH (ref 0.44–1.00)
GFR, Estimated: 44 mL/min — ABNORMAL LOW (ref >60)
Glucose, Bld: 103 mg/dL — ABNORMAL HIGH (ref 70–99)
Potassium: 4.3 mmol/L (ref 3.5–5.1)
Sodium: 134 mmol/L — ABNORMAL LOW (ref 135–145)
Total Bilirubin: <0.2 mg/dL — ABNORMAL LOW (ref 0.3–1.2)
Total Protein: 6.7 g/dL (ref 6.5–8.1)

## 2020-08-29 MED ORDER — PALONOSETRON HCL INJECTION 0.25 MG/5ML
INTRAVENOUS | Status: AC
  Filled 2020-08-29: qty 5

## 2020-08-29 MED ORDER — ACETAMINOPHEN 325 MG PO TABS
ORAL_TABLET | ORAL | Status: AC
  Filled 2020-08-29: qty 2

## 2020-08-29 MED ORDER — PALONOSETRON HCL INJECTION 0.25 MG/5ML
0.2500 mg | Freq: Once | INTRAVENOUS | Status: AC
  Administered 2020-08-29: 0.25 mg via INTRAVENOUS

## 2020-08-29 MED ORDER — DEXTROSE 5 % IV SOLN
Freq: Once | INTRAVENOUS | Status: AC
  Filled 2020-08-29: qty 250

## 2020-08-29 MED ORDER — DEXTROSE 5 % IV SOLN
300.0000 mg | Freq: Once | INTRAVENOUS | Status: AC
  Administered 2020-08-29: 300 mg via INTRAVENOUS
  Filled 2020-08-29: qty 15

## 2020-08-29 MED ORDER — HEPARIN SOD (PORK) LOCK FLUSH 100 UNIT/ML IV SOLN
500.0000 [IU] | Freq: Once | INTRAVENOUS | Status: AC | PRN
  Administered 2020-08-29: 500 [IU]
  Filled 2020-08-29: qty 5

## 2020-08-29 MED ORDER — DIPHENHYDRAMINE HCL 25 MG PO CAPS
ORAL_CAPSULE | ORAL | Status: AC
  Filled 2020-08-29: qty 2

## 2020-08-29 MED ORDER — SODIUM CHLORIDE 0.9% FLUSH
10.0000 mL | INTRAVENOUS | Status: DC | PRN
Start: 2020-08-29 — End: 2020-08-29
  Administered 2020-08-29: 10 mL
  Filled 2020-08-29: qty 10

## 2020-08-29 MED ORDER — ACETAMINOPHEN 325 MG PO TABS
650.0000 mg | ORAL_TABLET | Freq: Once | ORAL | Status: AC
  Administered 2020-08-29: 650 mg via ORAL

## 2020-08-29 MED ORDER — SODIUM CHLORIDE 0.9 % IV SOLN
10.0000 mg | Freq: Once | INTRAVENOUS | Status: AC
  Administered 2020-08-29: 10 mg via INTRAVENOUS
  Filled 2020-08-29: qty 10

## 2020-08-29 MED ORDER — DIPHENHYDRAMINE HCL 25 MG PO CAPS
50.0000 mg | ORAL_CAPSULE | Freq: Once | ORAL | Status: AC
  Administered 2020-08-29: 50 mg via ORAL

## 2020-08-29 MED ORDER — SODIUM CHLORIDE 0.9% FLUSH
10.0000 mL | INTRAVENOUS | Status: DC | PRN
  Administered 2020-08-29: 10 mL
  Filled 2020-08-29: qty 10

## 2020-08-29 NOTE — Patient Instructions (Signed)

## 2020-08-29 NOTE — Patient Instructions (Signed)
Ado-Trastuzumab Emtansine for injection What is this medicine? ADO-TRASTUZUMAB EMTANSINE (ADD oh traz TOO zuh mab em TAN zine) is a monoclonal antibody combined with chemotherapy. It is used to treat breast cancer. This medicine may be used for other purposes; ask your health care provider or pharmacist if you have questions. COMMON BRAND NAME(S): Kadcyla What should I tell my health care provider before I take this medicine? They need to know if you have any of these conditions:  heart disease  heart failure  infection (especially a virus infection such as chickenpox, cold sores, or herpes)  liver disease  lung or breathing disease, like asthma  tingling of the fingers or toes, or other nerve disorder  an unusual or allergic reaction to ado-trastuzumab emtansine, other medications, foods, dyes, or preservatives  pregnant or trying to get pregnant  breast-feeding How should I use this medicine? This medicine is for infusion into a vein. It is given by a health care professional in a hospital or clinic setting. Talk to your pediatrician regarding the use of this medicine in children. Special care may be needed. Overdosage: If you think you have taken too much of this medicine contact a poison control center or emergency room at once. NOTE: This medicine is only for you. Do not share this medicine with others. What if I miss a dose? It is important not to miss your dose. Call your doctor or health care professional if you are unable to keep an appointment. What may interact with this medicine? This medicine may also interact with the following medications:  atazanavir  boceprevir  clarithromycin  delavirdine  indinavir  dalfopristin; quinupristin  isoniazid, INH  itraconazole  ketoconazole  nefazodone  nelfinavir  ritonavir  telaprevir  telithromycin  tipranavir  voriconazole This list may not describe all possible interactions. Give your health care  provider a list of all the medicines, herbs, non-prescription drugs, or dietary supplements you use. Also tell them if you smoke, drink alcohol, or use illegal drugs. Some items may interact with your medicine. What should I watch for while using this medicine? Visit your doctor for checks on your progress. This drug may make you feel generally unwell. This is not uncommon, as chemotherapy can affect healthy cells as well as cancer cells. Report any side effects. Continue your course of treatment even though you feel ill unless your doctor tells you to stop. You may need blood work done while you are taking this medicine. Call your doctor or health care professional for advice if you get a fever, chills or sore throat, or other symptoms of a cold or flu. Do not treat yourself. This drug decreases your body's ability to fight infections. Try to avoid being around people who are sick. Be careful brushing and flossing your teeth or using a toothpick because you may get an infection or bleed more easily. If you have any dental work done, tell your dentist you are receiving this medicine. Avoid taking products that contain aspirin, acetaminophen, ibuprofen, naproxen, or ketoprofen unless instructed by your doctor. These medicines may hide a fever. Do not become pregnant while taking this medicine or for 7 months after stopping it, men with female partners should use contraception during treatment and for 4 months after the last dose. Women should inform their doctor if they wish to become pregnant or think they might be pregnant. There is a potential for serious side effects to an unborn child. Do not breast-feed an infant while taking this medicine or   for 7 months after the last dose. Men who have a partner who is pregnant or who is capable of becoming pregnant should use a condom during sexual activity while taking this medicine and for 4 months after stopping it. Men should inform their doctors if they wish to  father a child. This medicine may lower sperm counts. Talk to your health care professional or pharmacist for more information. What side effects may I notice from receiving this medicine? Side effects that you should report to your doctor or health care professional as soon as possible:  allergic reactions like skin rash, itching or hives, swelling of the face, lips, or tongue  breathing problems  chest pain or palpitations  fever or chills, sore throat  general ill feeling or flu-like symptoms  light-colored stools  nausea, vomiting  pain, tingling, numbness in the hands or feet  signs and symptoms of bleeding such as bloody or black, tarry stools; red or dark-brown urine; spitting up blood or brown material that looks like coffee grounds; red spots on the skin; unusual bruising or bleeding from the eye, gums, or nose  swelling of the legs or ankles  yellowing of the eyes or skin Side effects that usually do not require medical attention (report to your doctor or health care professional if they continue or are bothersome):  changes in taste  constipation  dizziness  headache  joint pain  muscle pain  trouble sleeping  unusually weak or tired This list may not describe all possible side effects. Call your doctor for medical advice about side effects. You may report side effects to FDA at 1-800-FDA-1088. Where should I keep my medicine? This drug is given in a hospital or clinic and will not be stored at home. NOTE: This sheet is a summary. It may not cover all possible information. If you have questions about this medicine, talk to your doctor, pharmacist, or health care provider.  2021 [employer] (2017-12-02 10:03:15)

## 2020-08-30 LAB — CANCER ANTIGEN 27.29: CA 27.29: 387.5 U/mL — ABNORMAL HIGH (ref 0.0–38.6)

## 2020-09-01 ENCOUNTER — Inpatient Hospital Stay: Payer: Medicaid Other

## 2020-09-01 ENCOUNTER — Inpatient Hospital Stay: Payer: Medicaid Other | Admitting: Hematology

## 2020-09-01 NOTE — Progress Notes (Incomplete)
[place]   [phone] [phone]   Clinic Follow up Note   Patient Care Team: [name], DO as PCP - General (Family Medicine) [name], MD as Consulting Physician (Gastroenterology) [name], MD as Consulting Physician (Hematology)  Date of Service:  09/01/2020  CHIEF COMPLAINT: F/u for metastatic breast cancer  SUMMARY OF ONCOLOGIC HISTORY: Oncology History Overview Note  Cancer Staging Metastatic breast cancer [facility]) Staging form: Breast, AJCC 8th Edition - Clinical stage from 03/24/2018: Stage IV (cT2, cN1, pM1, G3, ER+, PR+, HER2+) - Signed by [name], MD on 03/30/2018     Metastatic breast cancer ([place])  01/30/2018 Procedure   Colonoscopy showed small polyp in the sigmoid colon, removed, the exam of colon including the terminal ileum was otherwise negative.   01/30/2018 Procedure   EGD by Dr. Collene Mares showed small hiatal hernia, a 8 mm polypoid lesion in the cardia, biopsied.   03/09/2018 Imaging   03/09/2018 US Abdomen IMPRESSION: 1. Mass lesions throughout the liver, consistent with metastatic disease. Liver as a somewhat nodular contour suggesting underlying hepatic cirrhosis. Inhomogeneous echotexture to the liver.  2. Cholelithiasis with mild gallbladder wall thickening. A degree of cholecystitis cannot be excluded by ultrasound.  3. Portions of pancreas obscured by gas. Visualized portions of pancreas appear normal.  4. Small right kidney. Etiology uncertain. This finding potentially may be indicative of renal artery stenosis. In this regard, question whether patient is hypertensive.   03/15/2018 Imaging   CT CAP with contrast  IMPRESSION: 1. Widespread hepatic metastasis. 2. 2.6 cm lateral right breast soft tissue nodule could represent a breast primary or an incidental benign lesion. Consider correlation with mammogram and ultrasound. 3. No definite source of primary malignancy identified within the abdomen or  pelvis. There is possible rectosigmoid junction wall thickening. Consider colonoscopy with attention to this area. 4. Distal esophageal wall thickening, suggesting esophagitis.   03/24/2018 Cancer Staging   Staging form: Breast, AJCC 8th Edition - Clinical stage from 03/24/2018: Stage IV (cT2, cN1, pM1, G3, ER+, PR+, HER2+) - Signed by [name], MD on 03/30/2018   03/24/2018 Initial Biopsy   Diagnosis 1. 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 Breast, right, needle core biopsy, 9 o'clock, 7cm from nipple - INVASIVE DUCTAL CARCINOMA. -The carcinoma is somewhat morphologically dissimilar from that in part 1. It appears grade III 3. Lymph node, needle/core biopsy, right axillary - METASTATIC CARCINOMA IN 1 OF 1 LYMPH NODE (1/1).   03/24/2018 Receptors her2   Breast biopsy: 1. Estrogen Receptor: 40%, POSITIVE, STRONG-MODERATE STAINING INTENSITY Progesterone Receptor: 70%, POSITIVE, STRONG STAINING INTENSITY Proliferation Marker Ki67: 20% HER 2 equivocal by IHC 2+, POSITIVE by FISH, ratio 2.4 and copy #4.2  2. Estrogen Receptor: 60%, POSITIVE, MODERATE STAINING INTENSITY Progesterone Receptor: 40%, POSITIVE, MODERATE STAINING INTENSITY Proliferation Marker Ki67: 20% HER2 (+) by IHC 3+   03/24/2018 Initial Diagnosis   Metastatic breast cancer ([place])   03/27/2018 Pathology Results   Diagnosis Liver, needle/core biopsy, Right - METASTATIC CARCINOMA TO LIVER, CONSISTENT WITH PATIENTS CLINICAL HISTORY OF PRIMARY BREAST CARCINOMA.  ER 80%+ PR40%+ HER2- (by [facility], IHC 2+)  Ki67 50%    03/28/2018 Pathology Results   03/28/2018 Surgical Pathology Diagnosis 1. Breast, left, needle core biopsy, 9 o'clock - FIBROCYSTIC CHANGES. - THERE IS NO EVIDENCE OF MALIGNANCY. 2. Breast, left, needle core biopsy, 2 o'clock - FIBROADENOMA. - THERE IS NO EVIDENCE OF  MALIGNANCY. - SEE COMMENT.   03/29/2018 Imaging    03/29/2018 Bone Scan IMPRESSION: No scintigraphic evidence of osseous metastatic disease.   03/30/2018 Imaging   Bone scan  IMPRESSION: No scintigraphic evidence of osseous metastatic disease.    04/07/2018 - 07/30/2020 Chemotherapy   First line chemo weekly Taxol and herceptin/Perjeta every 3 weeks starting 04/07/18. She developed infusion reaction to taxol and it was discontinued. Added Abraxane on C1D8, 2 weeks on/1 week off.  Abraxne stopped after 10/13/18 due to worsening Neuropathy. She has continued with maintenance Herceptin injection/Perjeta q3weeks. Herceptin changed to injection on 04/06/19. Both injections switched to combination [place] on 10/01/19. ----Stopped 07/30/20 due to disease progression in liver.    06/06/2018 Imaging   CT CAP IMPRESSION: 1. Generally improved appearance, with reduced axillary adenopathy and reduced enhancing component of the hepatic masses, with some of the hepatic mass is moderately smaller than on the prior exam. Reduced size of the right lateral breast mass compared to the prior 03/15/2018 exam. 2. New mild interstitial accentuation in the lungs, significance uncertain. Part of this appearance may be due to lower lung volumes on today's exam. 3. Mild wall thickening in the descending colon and upper rectum suggesting low-grade colitis/inflammation. Prominent stool throughout the colon favors constipation. 4. Other imaging findings of potential clinical significance: Aortic Atherosclerosis (ICD10-I70.0). Mild cardiomegaly. Mild nodularity in the right lower lobe appears stable. Contracted and thick-walled gallbladder.    09/18/2018 Imaging   CT CAP W Contrast 09/18/18  IMPRESSION: 1. Liver metastases have decreased in size. 2. Right breast mass, mild right axillary lymphadenopathy and scattered tiny right pulmonary nodules are all stable. 3. New mild left supraclavicular and left subpectoral lymphadenopathy, can not exclude progression of metastatic  nodal disease. 4. Moderate colorectal stool volume, which may indicate constipation. 5.  Aortic Atherosclerosis (ICD10-I70.0).   10/2018 - 07/30/2020 Anti-estrogen oral therapy   Letrozole 2.5 mg daily starting 10/2018 D/c to proceed with Inhertu treatment after liver met progression.    01/10/2019 Imaging   CT CAP W Contrast 01/10/19  IMPRESSION: 1. Continued improvement in the hepatic metastatic lesions which have reduced in size. 2. Stable mild left supraclavicular and subpectoral adenopathy. 3. Essentially stable small right lower lobe pulmonary nodule and separate small subpleural nodule along the right hemidiaphragm. Surveillance suggested. 4. Other imaging findings of potential clinical significance: Mild cardiomegaly. Mild circumferential distal esophageal wall thickening, the most common cause would be esophagitis. Airway thickening is present, suggesting bronchitis or reactive airways disease. Airway plugging in the lower lobes and in the right middle lobe. Stable small right adrenal adenoma.   05/15/2019 Imaging   CT CAP W Contrast 05/15/19  IMPRESSION: CT CHEST IMPRESSION   1. Similar appearance of borderline supraclavicular, axillary, and subpectoral adenopathy. 2. Improved and resolved right lower lobe pulmonary nodularity. 3. Esophageal air fluid level suggests dysmotility or gastroesophageal reflux.   CT ABDOMEN AND PELVIS IMPRESSION   1. Improved hepatic metastasis. 2. Small bowel mesenteric lymph nodes which are upper normal and mildly enlarged. Likely increased and similar as detailed above. Indeterminate. Recommend attention on follow-up. 3. Cholelithiasis. 4. Motion degradation throughout the lower chest and abdomen.   09/19/2019 Imaging   CT CAP W contrast  IMPRESSION: 1. Interval decrease in size of right axillary and subpectoral lymph nodes. There are no pathologically enlarged lymph nodes remaining in the chest, abdomen, or pelvis. No new  lymphadenopathy. 2. No significant change in post treatment appearance of multiple low-attenuation liver lesions, in keeping with treated metastases.  3. No evidence of new metastatic disease in the chest, abdomen, or pelvis. 4. Aortic Atherosclerosis (ICD10-I70.0).   11/16/2019 Imaging   IMRI Brain  MPRESSION: Minimal chronic microvascular ischemic changes. No acute intracranial process.   Active bilateral maxillary sinus disease with mild frontoethmoid mucosal thickening.   01/23/2020 Imaging   CT CAP W contrast  IMPRESSION: 1. Stable exam. No new or progressive interval findings. 2. Stable appearance of upper normal right axillary lymph nodes. Tiny subpectoral and left axillary nodes are unchanged. 3. Generally similar appearance of ill-defined hypoattenuating lesions in the liver compatible with treated metastases. 1 lesion in the central liver is minimally more conspicuous today, likely related to bolus timing and attention on follow-up recommended. No new suspicious liver lesion on today's study. 4. Stable 10 mm right adrenal nodule., indeterminate. Continued attention on follow-up imaging recommended. 5. Cholelithiasis. 6. Aortic Atherosclerosis (ICD10-I70.0).   02/11/2020 Breast MRI   IMPRESSION: 1. 7 millimeter focus of residual enhancement associated with the known malignancy in the 9 o'clock location of the RIGHT breast. 2. No significant enhancement in the known malignancy in the 11:30 o'clock location of the RIGHT breast. 3. Interval resolution of axillary adenopathy.   07/28/2020 Imaging   CT CAP  IMPRESSION: 1. Interval progression of hepatic metastases. 2. Stable indeterminate right adrenal nodule. 3. No new sites of metastatic disease in the chest, abdomen or pelvis. 4. Chronic findings include: Small hiatal hernia. Cholelithiasis. Aortic Atherosclerosis (ICD10-I70.0).     08/08/2020 -  Chemotherapy   Second-line Inhertu q3weeks starting 08/08/20        CURRENT THERAPY:  Second-lineEnhertu q3weeks starting1/21/22. Due to poor toleration with C1, she required dose reduction from C2.   INTERVAL HISTORY: *** Teresa Hays is here for a follow up. She presents to the clinic alone.    REVIEW OF SYSTEMS:  *** Constitutional: Denies fevers, chills or abnormal weight loss Eyes: Denies blurriness of vision Ears, nose, mouth, throat, and face: Denies mucositis or sore throat Respiratory: Denies cough, dyspnea or wheezes Cardiovascular: Denies palpitation, chest discomfort or lower extremity swelling Gastrointestinal:  Denies nausea, heartburn or change in bowel habits Skin: Denies abnormal skin rashes Lymphatics: Denies new lymphadenopathy or easy bruising Neurological:Denies numbness, tingling or new weaknesses Behavioral/Psych: Mood is stable, no new changes  All other systems were reviewed with the patient and are negative.  MEDICAL HISTORY:  Past Medical History:  Diagnosis Date  . GERD (gastroesophageal reflux disease)   . Hypertension   . Personal history of chemotherapy   . rt breast ca with mets to liver dx'd 03/2018    SURGICAL HISTORY: Past Surgical History:  Procedure Laterality Date  . BREAST BIOPSY Right 03/24/2018   CA x3  . BREAST BIOPSY Left 03/28/2018   neg  . BREAST BIOPSY Left 03/30/2018   neg  . COLONOSCOPY    . ESOPHAGOGASTRODUODENOSCOPY ENDOSCOPY    . IR IMAGING GUIDED PORT INSERTION  04/04/2018    I have reviewed the social history and family history with the patient and they are unchanged from previous note.  ALLERGIES:  has No Known Allergies.  MEDICATIONS:  Current Outpatient Medications  Medication Sig Dispense Refill  . albuterol (VENTOLIN HFA) 108 (90 Base) MCG/ACT inhaler Inhale 1-2 puffs into the lungs every 6 (six) hours as needed for wheezing or shortness of breath. 18 g 6  . amLODipine (NORVASC) 5 MG tablet TAKE 1 TABLET BY MOUTH DAILY. 30 tablet 2  .  Budeson-Glycopyrrol-Formoterol (BREZTRI AEROSPHERE) 160-9-4.8 MCG/ACT AERO Inhale 2  puffs into the lungs in the morning and at bedtime. 8 g 0  . carvedilol (COREG) 3.125 MG tablet TAKE 1 TABLET BY MOUTH TWICE DAILY. REPLACES ATENOLOL 180 tablet 3  . diphenoxylate-atropine (LOMOTIL) 2.5-0.025 MG tablet Take 1-2 tablets by mouth 4 (four) times daily as needed for diarrhea or loose stools. 60 tablet 1  . DULoxetine (CYMBALTA) 20 MG capsule TAKE 2 CAPSULES BY MOUTH DAILY 60 capsule 3  . gabapentin (NEURONTIN) 300 MG capsule May take up to 3 capsules three times daily 270 capsule 3  . lidocaine (XYLOCAINE) 2 % jelly Apply 1 application topically as needed.    Marland Kitchen losartan (COZAAR) 50 MG tablet Take 2 tablets (100 mg total) by mouth daily. 90 tablet 3  . montelukast (SINGULAIR) 10 MG tablet TAKE 1 TABLET (10 MG TOTAL) BY MOUTH AT BEDTIME. 30 tablet 5  . omeprazole (PRILOSEC) 40 MG capsule TAKE 1 CAPSULE BY MOUTH TWICE DAILY 60 capsule 3  . ondansetron (ZOFRAN) 8 MG tablet Take 1 tablet (8 mg total) by mouth every 8 (eight) hours as needed for nausea or vomiting. 30 tablet 3  . potassium chloride SA (KLOR-CON) 20 MEQ tablet TAKE 1 TABLET BY MOUTH ONCE DAILY 30 tablet 3  . predniSONE (DELTASONE) 10 MG tablet 4 x 4 days, 3 x 4 days, 2 x 4 days, 1 x 4 days then stop 40 tablet 0  . prochlorperazine (COMPAZINE) 10 MG tablet Take 1 tablet (10 mg total) by mouth every 6 (six) hours as needed for nausea or vomiting. 30 tablet 3  . rivaroxaban (XARELTO) 20 MG TABS tablet TAKE 1 TABLET BY MOUTH DAILY WITH SUPPER 30 tablet 5  . spironolactone (ALDACTONE) 25 MG tablet TAKE 1 TABLET BY MOUTH ONCE DAILY 90 tablet 3  . traMADol (ULTRAM) 50 MG tablet Take 1 tablet (50 mg total) by mouth every 6 (six) hours as needed for moderate pain or severe pain. 30 tablet 0   No current facility-administered medications for this visit.   Facility-Administered Medications Ordered in Other Visits  Medication Dose Route Frequency  Provider Last Rate Last Admin  . sodium chloride flush (NS) 0.9 % injection 10 mL  10 mL Intracatheter PRN [name], MD   10 mL at 05/28/20 0935    PHYSICAL EXAMINATION: ECOG PERFORMANCE STATUS: {CHL ONC ECOG PS:[phone]}  There were no vitals filed for this visit. There were no vitals filed for this visit. *** GENERAL:alert, no distress and comfortable SKIN: skin color, texture, turgor are normal, no rashes or significant lesions EYES: normal, Conjunctiva are pink and non-injected, sclera clear {OROPHARYNX:no exudate, no erythema and lips, buccal mucosa, and tongue normal}  NECK: supple, thyroid normal size, non-tender, without nodularity LYMPH:  no palpable lymphadenopathy in the cervical, axillary {or inguinal} LUNGS: clear to auscultation and percussion with normal breathing effort HEART: regular rate & rhythm and no murmurs and no lower extremity edema ABDOMEN:abdomen soft, non-tender and normal bowel sounds Musculoskeletal:no cyanosis of digits and no clubbing  NEURO: alert & oriented x 3 with fluent speech, no focal motor/sensory deficits  LABORATORY DATA:  I have reviewed the data as listed CBC Latest Ref Rng & Units 08/29/2020 08/15/2020 08/08/2020  WBC 4.0 - 10.5 K/uL 8.3 7.3 10.0  Hemoglobin 12.0 - 15.0 g/dL 9.8(L) 11.0(L) 10.4(L)  Hematocrit 36.0 - 46.0 % 30.9(L) 33.9(L) 32.5(L)  Platelets 150 - 400 K/uL 355 157 218     CMP Latest Ref Rng & Units 08/29/2020 08/15/2020 08/08/2020  Glucose 70 - 99 mg/dL  103(H) 126(H) 93  BUN 8 - 23 mg/dL _0 Creatinine 0.44 - 1.00 mg/dL 1.34(H) 1.77(H) 1.28(H)  Sodium 135 - 145 mmol/L 134(L) 128(L) 137  Potassium 3.5 - 5.1 mmol/L 4.3 5.0 3.9  Chloride 98 - 111 mmol/L 103 97(L) 102  CO2 22 - 32 mmol/L _1 Calcium 8.9 - 10.3 mg/dL 9.5 9.0 9.2  Total Protein 6.5 - 8.1 g/dL 6.7 6.8 6.6  Total Bilirubin 0.3 - 1.2 mg/dL <0.2(L) 0.6 0.4  Alkaline Phos 38 - 126 U/L 138(H) 171(H) 149(H)  AST 15 - 41 U/L _2 ALT 0 - 44 U/L  20 44 35      RADIOGRAPHIC STUDIES: I have personally reviewed the radiological images as listed and agreed with the findings in the report. No results found.   ASSESSMENT & PLAN:  KEVINA PILOTO is a 65 y.o. female with    1. 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 Liver met was HER2-.  -Given the metastatic disease, her cancer is not curable but still treatable. -She had been treated withfirst linemaintenancetreatment with letrozole,trastuzumab and Perjeta, until recent disease progression on 07/28/20 CT CAP.  -I started her on second-lineEnhertuq3weeks starting 08/08/20. Given poor toleration with C1 (nausea, diarrhea, poor appetite), dose reduced with C2.  ***  -Labs reviewed and adequate to proceed with C2 Enhertu today with dose reduced.  -Will give IV Fluids next week with toxicity check.   2.Peripheral neuropathy, grade 2, Right Foot pain,Secondarytochemotherapy -Ipreviouslystropped Abraxane on 10/13/18. -S/p PT and steroid injection her right foot pain is controlled. She still has mild tingling neuropathy in her toes an fingers. She has adequate function and denies falls. I discussed this can take time to resolve and may not fully resolve. -She will also continue OTC Calcium and Vit D,OralB12, Gabapentin 339m3 tabsTID andCymbalta to 440m Tramadol every other night for right foot pain.Well controlled. 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 Anemia, Secondary to chemo -Continue monitoring, will consider blood transfusion if hemoglobin less than 8. -Mild and stable.  5Social support, Goal of Care Discussion -She lives alone, [place]  [place]  -On Ativan PRN due to anxietyabout diagnosis and treatment -She is full code  6.Asthma -She has been seen by Dr. IcaValeta Harmso notes this is related to her post nasal drip and her Acid reflux can contribute. -She is onPrilosec2 tabs BID,allergymedicationand nasal spray and inhalers.Her insurance denied Dexilant.  -Continue to f/u withDr Icard and Dr ManCollene Maresdpulmonary clinic. -Her breathing has been doing better. Continue tof/u with Dr IcaValeta Harmsr better control.  7.Goal of care discussion  -We discussed the incurable nature of her cancer, and the overall poor prognosis, especially if she does not have good response to chemotherapy or progress on chemo -The patient understands the goal of care is palliative. -she is full code now  8. Nausea, diarrhea and anorexia -Secondary toEnhertu. She become very dehydrated with elevated Cr.  -Her diarrhea and nausea took a little over a week to resolve. She has been able to fully recover after IV Fluids. Will dose reduce treatment starting with C2 and continue supportive care with imodium, lomotil and antiemetics.   9.  Acute on chronic kidney disease -baseline Cr 1.1-1.2 -Cr went up to 1.77 after first cycle Enhertu due to diarrhea and dehydration -Cr improved today    Plan: -Labs reviewed and adequate to proceed with C2 Enhertu today with dose reduction  -Lab, Flush, IV Fluids and F/u on 2/14 -Lab, flush, F/u and Enhertu in 3 weeks    No problem-specific Assessment & Plan notes found for this encounter.   No orders of the defined types were placed in this encounter.  All questions were answered. The patient knows to call the clinic with any problems, questions or concerns. No barriers to learning was detected. The total time spent in the appointment was {CHL ONC TIME VISIT - VGVSY:[id number]}.     Joslyn Devon 09/01/2020   Oneal Deputy, am  acting as scribe for [name], MD.   {Add scribe attestation statement}

## 2020-09-04 ENCOUNTER — Other Ambulatory Visit: Payer: Self-pay

## 2020-09-04 ENCOUNTER — Other Ambulatory Visit: Payer: Medicaid Other

## 2020-09-04 DIAGNOSIS — C50919 Malignant neoplasm of unspecified site of unspecified female breast: Secondary | ICD-10-CM

## 2020-09-04 DIAGNOSIS — R197 Diarrhea, unspecified: Secondary | ICD-10-CM

## 2020-09-04 NOTE — Telephone Encounter (Signed)
Teresa Hays called stating that she passed out yesterday.  EMS was called vss EKG normal.  Ems felt it was vasovagal episode.  Appt made for evaluation with Sandi Mealy tomorrow as pt is out of town today.

## 2020-09-05 ENCOUNTER — Inpatient Hospital Stay: Payer: Medicaid Other

## 2020-09-05 ENCOUNTER — Other Ambulatory Visit (HOSPITAL_COMMUNITY): Payer: Self-pay | Admitting: Medical

## 2020-09-05 ENCOUNTER — Inpatient Hospital Stay (HOSPITAL_BASED_OUTPATIENT_CLINIC_OR_DEPARTMENT_OTHER): Payer: Medicaid Other | Admitting: Medical

## 2020-09-05 ENCOUNTER — Other Ambulatory Visit: Payer: Self-pay

## 2020-09-05 VITALS — BP 111/82 | HR 84 | Temp 97.5°F | Resp 18 | Ht 64.0 in | Wt 144.3 lb

## 2020-09-05 DIAGNOSIS — R42 Dizziness and giddiness: Secondary | ICD-10-CM

## 2020-09-05 DIAGNOSIS — C50919 Malignant neoplasm of unspecified site of unspecified female breast: Secondary | ICD-10-CM

## 2020-09-05 DIAGNOSIS — E860 Dehydration: Secondary | ICD-10-CM | POA: Diagnosis not present

## 2020-09-05 DIAGNOSIS — Z95828 Presence of other vascular implants and grafts: Secondary | ICD-10-CM

## 2020-09-05 DIAGNOSIS — Z5112 Encounter for antineoplastic immunotherapy: Secondary | ICD-10-CM | POA: Diagnosis not present

## 2020-09-05 DIAGNOSIS — R197 Diarrhea, unspecified: Secondary | ICD-10-CM

## 2020-09-05 LAB — CBC WITH DIFFERENTIAL (CANCER CENTER ONLY)
Abs Immature Granulocytes: 0.03 10*3/uL (ref 0.00–0.07)
Basophils Absolute: 0 10*3/uL (ref 0.0–0.1)
Basophils Relative: 0 %
Eosinophils Absolute: 0.1 10*3/uL (ref 0.0–0.5)
Eosinophils Relative: 1 %
HCT: 30.9 % — ABNORMAL LOW (ref 36.0–46.0)
Hemoglobin: 10.2 g/dL — ABNORMAL LOW (ref 12.0–15.0)
Immature Granulocytes: 0 %
Lymphocytes Relative: 27 %
Lymphs Abs: 2.5 10*3/uL (ref 0.7–4.0)
MCH: 28.3 pg (ref 26.0–34.0)
MCHC: 33 g/dL (ref 30.0–36.0)
MCV: 85.8 fL (ref 80.0–100.0)
Monocytes Absolute: 0.4 10*3/uL (ref 0.1–1.0)
Monocytes Relative: 4 %
Neutro Abs: 6.3 10*3/uL (ref 1.7–7.7)
Neutrophils Relative %: 68 %
Platelet Count: 325 10*3/uL (ref 150–400)
RBC: 3.6 MIL/uL — ABNORMAL LOW (ref 3.87–5.11)
RDW: 14.1 % (ref 11.5–15.5)
WBC Count: 9.3 10*3/uL (ref 4.0–10.5)
nRBC: 0 % (ref 0.0–0.2)

## 2020-09-05 LAB — CMP (CANCER CENTER ONLY)
ALT: 28 U/L (ref 0–44)
AST: 27 U/L (ref 15–41)
Albumin: 3.4 g/dL — ABNORMAL LOW (ref 3.5–5.0)
Alkaline Phosphatase: 145 U/L — ABNORMAL HIGH (ref 38–126)
Anion gap: 8 (ref 5–15)
BUN: 16 mg/dL (ref 8–23)
CO2: 22 mmol/L (ref 22–32)
Calcium: 9.1 mg/dL (ref 8.9–10.3)
Chloride: 101 mmol/L (ref 98–111)
Creatinine: 1.53 mg/dL — ABNORMAL HIGH (ref 0.44–1.00)
GFR, Estimated: 38 mL/min — ABNORMAL LOW (ref >60)
Glucose, Bld: 97 mg/dL (ref 70–99)
Potassium: 4.5 mmol/L (ref 3.5–5.1)
Sodium: 131 mmol/L — ABNORMAL LOW (ref 135–145)
Total Bilirubin: 0.3 mg/dL (ref 0.3–1.2)
Total Protein: 7 g/dL (ref 6.5–8.1)

## 2020-09-05 LAB — MAGNESIUM: Magnesium: 1.8 mg/dL (ref 1.7–2.4)

## 2020-09-05 MED ORDER — MECLIZINE HCL 12.5 MG PO TABS: 12.5000 mg | ORAL_TABLET | Freq: Three times a day (TID) | ORAL | 2 refills | Status: DC | PRN

## 2020-09-05 MED ORDER — SODIUM CHLORIDE 0.9% FLUSH
10.0000 mL | INTRAVENOUS | Status: DC | PRN
Start: 2020-09-05 — End: 2020-09-05
  Administered 2020-09-05: 10 mL
  Filled 2020-09-05: qty 10

## 2020-09-05 MED ORDER — SODIUM CHLORIDE 0.9% FLUSH
10.0000 mL | INTRAVENOUS | Status: DC | PRN
  Administered 2020-09-05: 10 mL
  Filled 2020-09-05: qty 10

## 2020-09-05 MED ORDER — SODIUM CHLORIDE 0.9 % IV SOLN
INTRAVENOUS | Status: AC
  Filled 2020-09-05 (×2): qty 250

## 2020-09-05 MED ORDER — HEPARIN SOD (PORK) LOCK FLUSH 100 UNIT/ML IV SOLN
500.0000 [IU] | Freq: Once | INTRAVENOUS | Status: AC | PRN
  Administered 2020-09-05: 500 [IU]
  Filled 2020-09-05: qty 5

## 2020-09-05 MED FILL — MECLIZINE HCL 25 MG TABS: 25 | 10 days supply | Qty: 15 | Fill #0

## 2020-09-05 NOTE — Patient Instructions (Signed)

## 2020-09-05 NOTE — Progress Notes (Signed)
Per Vickki Hearing to stop fluids early. Patient discharged in stable condition

## 2020-09-05 NOTE — Patient Instructions (Signed)
Rehydration, Adult Rehydration is the replacement of body fluids, salts, and minerals (electrolytes) that are lost during dehydration. Dehydration is when there is not enough water or other fluids in the body. This happens when you lose more fluids than you take in. Common causes of dehydration include:  Not drinking enough fluids. This can occur when you are ill or doing activities that require a lot of energy, especially in hot weather.  Conditions that cause loss of water or other fluids, such as diarrhea, vomiting, sweating, or urinating a lot.  Other illnesses, such as fever or infection.  Certain medicines, such as those that remove excess fluid from the body (diuretics). Symptoms of mild or moderate dehydration may include thirst, dry lips and mouth, and dizziness. Symptoms of severe dehydration may include increased heart rate, confusion, fainting, and not urinating. For severe dehydration, you may need to get fluids through an IV at the hospital. For mild or moderate dehydration, you can usually rehydrate at home by drinking certain fluids as told by your health care provider. What are the risks? Generally, rehydration is safe. However, taking in too much fluid (overhydration) can be a problem. This is rare. Overhydration can cause an electrolyte imbalance, kidney failure, or a decrease in salt (sodium) levels in the body. Supplies needed You will need an oral rehydration solution (ORS) if your health care provider tells you to use one. This is a drink to treat dehydration. It can be found in pharmacies and retail stores. How to rehydrate Fluids Follow instructions from your health care provider for rehydration. The kind of fluid and the amount you should drink depend on your condition. In general, you should choose drinks that you prefer.  If told by your health care provider, drink an ORS. ? Make an ORS by following instructions on the package. ? Start by drinking small amounts,  about  cup (120 mL) every 5-10 minutes. ? Slowly increase how much you drink until you have taken the amount recommended by your health care provider.  Drink enough clear fluids to keep your urine pale yellow. If you were told to drink an ORS, finish it first, then start slowly drinking other clear fluids. Drink fluids such as: ? Water. This includes sparkling water and flavored water. Drinking only water can lead to having too little sodium in your body (hyponatremia). Follow the advice of your health care provider. ? Water from ice chips you suck on. ? Fruit juice with water you add to it (diluted). ? Sports drinks. ? Hot or cold herbal teas. ? Broth-based soups. ? Milk or milk products. Food Follow instructions from your health care provider about what to eat while you rehydrate. Your health care provider may recommend that you slowly begin eating regular foods in small amounts.  Eat foods that contain a healthy balance of electrolytes, such as bananas, oranges, potatoes, tomatoes, and spinach.  Avoid foods that are greasy or contain a lot of sugar. In some cases, you may get nutrition through a feeding tube that is passed through your nose and into your stomach (nasogastric tube, or NG tube). This may be done if you have uncontrolled vomiting or diarrhea.   Beverages to avoid Certain beverages may make dehydration worse. While you rehydrate, avoid drinking alcohol.   How to tell if you are recovering from dehydration You may be recovering from dehydration if:  You are urinating more often than before you started rehydrating.  Your urine is pale yellow.  Your energy level   improves.  You vomit less frequently.  You have diarrhea less frequently.  Your appetite improves or returns to normal.  You feel less dizzy or less light-headed.  Your skin tone and color start to look more normal. Follow these instructions at home:  Take over-the-counter and prescription medicines only  as told by your health care provider.  Do not take sodium tablets. Doing this can lead to having too much sodium in your body (hypernatremia). Contact a health care provider if:  You continue to have symptoms of mild or moderate dehydration, such as: ? Thirst. ? Dry lips. ? Slightly dry mouth. ? Dizziness. ? Dark urine or less urine than normal. ? Muscle cramps.  You continue to vomit or have diarrhea. Get help right away if you:  Have symptoms of dehydration that get worse.  Have a fever.  Have a severe headache.  Have been vomiting and the following happens: ? Your vomiting gets worse or does not go away. ? Your vomit includes blood or green matter (bile). ? You cannot eat or drink without vomiting.  Have problems with urination or bowel movements, such as: ? Diarrhea that gets worse or does not go away. ? Blood in your stool (feces). This may cause stool to look black and tarry. ? Not urinating, or urinating only a small amount of very dark urine, within 6-8 hours.  Have trouble breathing.  Have symptoms that get worse with treatment. These symptoms may represent a serious problem that is an emergency. Do not wait to see if the symptoms will go away. Get medical help right away. Call your local emergency services (911 in the [place] Do not drive yourself to the hospital. Summary  Rehydration is the replacement of body fluids and minerals (electrolytes) that are lost during dehydration.  Follow instructions from your health care provider for rehydration. The kind of fluid and amount you should drink depend on your condition.  Slowly increase how much you drink until you have taken the amount recommended by your health care provider.  Contact your health care provider if you continue to show signs of mild or moderate dehydration. This information is not intended to replace advice given to you by your health care provider. Make sure you discuss any questions you have with  your health care provider. Document Revised: 09/05/2019 Document Reviewed: 07/16/2019 Elsevier Patient Education  2021 [employer]  

## 2020-09-08 NOTE — Progress Notes (Signed)
[facility] Progress Note   Teresa Hays [id number] 1955-09-06 65 y.o.  Teresa Hays is managed by Dr. [name]  Actively treated with chemotherapy/immunotherapy/hormonal therapy: yes  Current therapy: Enhertu  Last treated: 08/29/2020 (cycle 2, day 1)  Next scheduled appointment with provider: 09/19/2020  Assessment: Plan:    Dehydration - Plan: 0.9 %  sodium chloride infusion  Vertigo - Plan: meclizine (ANTIVERT) 12.5 MG tablet  Port-A-Cath in place - Plan: heparin lock flush 100 unit/mL, sodium chloride flush (NS) 0.9 % injection 10 mL  Metastatic breast cancer (HCC)   Dehydration: The patient was given 500 mL of normal saline x1.  Vertigo: Patient was given a prescription for meclizine 12.5 mg p.o. 3 times daily as needed for vertigo.  Metastatic breast cancer: The patient is status post cycle 2, day 1 of Enhertu which was dosed on 08/29/2020.  She is scheduled to be seen in follow-up on 09/19/2020.  Please see After Visit Summary for patient specific instructions.  Future Appointments  Date Time Provider [place]  09/19/2020  7:30 AM [facility] None  09/19/2020  7:45 AM [facility] [place] FLUSH [facility] None  09/19/2020  8:20 AM [name], MD [facility] None  09/19/2020  9:15 AM [facility] INFUSION [facility] None  12/26/2020 10:00 AM [facility] ECHO OP 1 [facility]  12/26/2020 11:00 AM Bensimhon, Shaune Pascal, MD [facility] None    No orders of the defined types were placed in this encounter.      Subjective:   Patient ID:  Teresa Hays is a 65 y.o. (DOB 08/06/1955) female.  Chief Complaint: No chief complaint on file.   HPI Teresa Hays  is a 65 y.o. female with a diagnosis of a metastatic breast cancer.  She is managed by Dr. [name] and is status post cycle 2, day 1 of Enhertu which was dosed on 08/29/2020.  She presents to the clinic today with her daughter.  She reports that she passed out on  Wednesday.  EMS was called.  A rhythm strip was completed which showed sinus rhythm at 85 bpm while sitting with a pulse rate increasing to 96 bpm standing.The patient had a fingerstick glucose which returned at 116. She has been experiencing vertigo and ringing in her right ear.   Medications: I have reviewed the patient's current medications.  Allergies: No Known Allergies  Past Medical History:  Diagnosis Date  . GERD (gastroesophageal reflux disease)   . Hypertension   . Personal history of chemotherapy   . rt breast ca with mets to liver dx'd 03/2018    Past Surgical History:  Procedure Laterality Date  . BREAST BIOPSY Right 03/24/2018   CA x3  . BREAST BIOPSY Left 03/28/2018   neg  . BREAST BIOPSY Left 03/30/2018   neg  . COLONOSCOPY    . ESOPHAGOGASTRODUODENOSCOPY ENDOSCOPY    . IR IMAGING GUIDED PORT INSERTION  04/04/2018    Family History  Problem Relation Age of Onset  . Diabetes Mother   . Cancer Sister 41       breast cancer  . Breast cancer Sister   . Rheum arthritis Sister     Social History   Socioeconomic History  . Marital status: Single    Spouse name: Not on file  . Number of children: Not on file  . Years of education: Not on file  . Highest education level: Not on file  Occupational History  . Not on file  Tobacco  Use  . Smoking status: Never Smoker  . Smokeless tobacco: Never Used  Vaping Use  . Vaping Use: Never used  Substance and Sexual Activity  . Alcohol use: No  . Drug use: Never  . Sexual activity: Not on file  Other Topics Concern  . Not on file  Social History Narrative  . Not on file   Social Determinants of Health   Financial Resource Strain: Not on file  Food Insecurity: Not on file  Transportation Needs: Not on file  Physical Activity: Not on file  Stress: Not on file  Social Connections: Not on file  Intimate Partner Violence: Not on file    Past Medical History, Surgical history, Social history, and Family  history were reviewed and updated as appropriate.   Please see review of systems for further details on the patient's review from today.   Review of Systems:  Review of Systems  Constitutional: Negative for appetite change, chills, diaphoresis and fever.  HENT: Negative for dental problem, mouth sores and trouble swallowing.        Right ear ringing  Respiratory: Negative for cough, chest tightness and shortness of breath.   Cardiovascular: Negative for chest pain and palpitations.  Gastrointestinal: Negative for constipation, diarrhea, nausea and vomiting.  Neurological: Positive for dizziness. Negative for syncope, weakness and headaches.    Objective:   Physical Exam:  BP 111/82 (BP Location: Right Arm, Patient Position: Sitting)   Pulse 84   Temp (!) 97.5 F (36.4 C) (Tympanic)   Resp 18   Ht 5\' 4"  (1.626 m)   Wt 144 lb 4.8 oz (65.5 kg)   SpO2 100%   BMI 24.77 kg/m  ECOG: 1  Physical Exam Constitutional:      General: She is not in acute distress.    Appearance: She is not diaphoretic.  HENT:     Head: Normocephalic and atraumatic.  Eyes:     General: No scleral icterus.       Right eye: No discharge.        Left eye: No discharge.     Conjunctiva/sclera: Conjunctivae normal.  Cardiovascular:     Rate and Rhythm: Normal rate and regular rhythm.     Heart sounds: Normal heart sounds. No murmur heard. No friction rub. No gallop.   Pulmonary:     Effort: Pulmonary effort is normal. No respiratory distress.     Breath sounds: Normal breath sounds. No wheezing or rales.  Skin:    General: Skin is warm and dry.     Findings: No erythema or rash.  Neurological:     Mental Status: She is alert.     Coordination: Coordination normal.     Gait: Gait normal.  Psychiatric:        Mood and Affect: Mood normal.        Behavior: Behavior normal.        Thought Content: Thought content normal.        Judgment: Judgment normal.     Lab Review:     Component Value  Date/Time   NA 131 (L) 09/05/2020 1319   K 4.5 09/05/2020 1319   CL 101 09/05/2020 1319   CO2 22 09/05/2020 1319   GLUCOSE 97 09/05/2020 1319   BUN 16 09/05/2020 1319   CREATININE 1.53 (H) 09/05/2020 1319   CALCIUM 9.1 09/05/2020 1319   PROT 7.0 09/05/2020 1319   ALBUMIN 3.4 (L) 09/05/2020 1319   AST 27 09/05/2020 1319   ALT 28  09/05/2020 1319   ALKPHOS 145 (H) 09/05/2020 1319   BILITOT 0.3 09/05/2020 1319   GFRNONAA 38 (L) 09/05/2020 1319   GFRAA 54 (L) 04/16/2020 0842       Component Value Date/Time   WBC 9.3 09/05/2020 1319   WBC 3.2 (L) 04/25/2018 2030   RBC 3.60 (L) 09/05/2020 1319   HGB 10.2 (L) 09/05/2020 1319   HCT 30.9 (L) 09/05/2020 1319   HCT 34.2 01/04/2020 0809   PLT 325 09/05/2020 1319   MCV 85.8 09/05/2020 1319   MCH 28.3 09/05/2020 1319   MCHC 33.0 09/05/2020 1319   RDW 14.1 09/05/2020 1319   LYMPHSABS 2.5 09/05/2020 1319   MONOABS 0.4 09/05/2020 1319   EOSABS 0.1 09/05/2020 1319   BASOSABS 0.0 09/05/2020 1319   -------------------------------  Imaging from last 24 hours (if applicable):  Radiology interpretation: ECHOCARDIOGRAM COMPLETE  Result Date: 08/20/2020    ECHOCARDIOGRAM REPORT   Patient Name:   Teresa Hays [facility] Date of Exam: 08/20/2020 Medical Rec #:  [id number]           Height:       64.0 in Accession #:    [id number]          Weight:       141.7 lb Date of Birth:  June 25, 1956          BSA:          1.690 m Patient Age:    23 years            BP:           116/78 mmHg Patient Gender: F                   HR:           80 bpm. Exam Location:  Outpatient Procedure: 2D Echo, Cardiac Doppler, Color Doppler and Strain Analysis Indications:    Congestive Heart Failure 428.0 / I50.9  History:        Patient has prior history of Echocardiogram examinations, most                 recent 04/23/2020. Risk Factors:Hypertension and Non-Smoker.                 GERD.  Sonographer:    Vickie Epley RDCS Referring Phys: [id number] DANIEL R BENSIMHON IMPRESSIONS  1. Left  ventricular ejection fraction, by estimation, is 55 to 60%. The left ventricle has normal function. The left ventricle has no regional wall motion abnormalities. Left ventricular diastolic parameters are consistent with Grade I diastolic dysfunction (impaired relaxation). The average left ventricular global longitudinal strain is -19.4 %.  2. Right ventricular systolic function is normal. The right ventricular size is normal.  3. The mitral valve is normal in structure. Trivial mitral valve regurgitation. No evidence of mitral stenosis.  4. The aortic valve is normal in structure. Aortic valve regurgitation is not visualized. No aortic stenosis is present.  5. The inferior vena cava is normal in size with greater than 50% respiratory variability, suggesting right atrial pressure of 3 mmHg. FINDINGS  Left Ventricle: Left ventricular ejection fraction, by estimation, is 55 to 60%. The left ventricle has normal function. The left ventricle has no regional wall motion abnormalities. The average left ventricular global longitudinal strain is -19.4 %. The left ventricular internal cavity size was normal in size. There is no left ventricular hypertrophy. Left ventricular diastolic parameters are consistent with Grade I diastolic dysfunction (impaired relaxation). Right Ventricle:  The right ventricular size is normal. No increase in right ventricular wall thickness. Right ventricular systolic function is normal. Left Atrium: Left atrial size was normal in size. Right Atrium: Right atrial size was not well visualized. Pericardium: There is no evidence of pericardial effusion. Mitral Valve: The mitral valve is normal in structure. Trivial mitral valve regurgitation. No evidence of mitral valve stenosis. Tricuspid Valve: The tricuspid valve is normal in structure. Tricuspid valve regurgitation is trivial. No evidence of tricuspid stenosis. Aortic Valve: The aortic valve is normal in structure. Aortic valve regurgitation is not  visualized. No aortic stenosis is present. Pulmonic Valve: The pulmonic valve was normal in structure. Pulmonic valve regurgitation is not visualized. No evidence of pulmonic stenosis. Aorta: The aortic root is normal in size and structure. Venous: The inferior vena cava was not well visualized. The inferior vena cava is normal in size with greater than 50% respiratory variability, suggesting right atrial pressure of 3 mmHg. IAS/Shunts: No atrial level shunt detected by color flow Doppler.  LEFT VENTRICLE PLAX 2D LVIDd:         5.10 cm      Diastology LVIDs:         3.60 cm      LV e' medial:    5.77 cm/s LV PW:         0.70 cm      LV E/e' medial:  10.5 LV IVS:        0.70 cm      LV e' lateral:   11.70 cm/s LVOT diam:     2.10 cm      LV E/e' lateral: 5.2 LV SV:         67 LV SV Index:   39           2D Longitudinal Strain LVOT Area:     3.46 cm     2D Strain GLS Avg:     -19.4 %  LV Volumes (MOD) LV vol d, MOD A2C: 107.0 ml LV vol d, MOD A4C: 101.0 ml LV vol s, MOD A2C: 42.0 ml LV vol s, MOD A4C: 46.8 ml LV SV MOD A2C:     65.0 ml LV SV MOD A4C:     101.0 ml LV SV MOD BP:      62.6 ml RIGHT VENTRICLE RV S prime:     11.30 cm/s TAPSE (M-mode): 2.0 cm LEFT ATRIUM             Index       RIGHT ATRIUM           Index LA diam:        3.50 cm 2.07 cm/m  RA Area:     11.40 cm LA Vol (A2C):   35.7 ml 21.12 ml/m RA Volume:   24.70 ml  14.61 ml/m LA Vol (A4C):   35.1 ml 20.77 ml/m LA Biplane Vol: 37.6 ml 22.25 ml/m  AORTIC VALVE LVOT Vmax:   90.10 cm/s LVOT Vmean:  62.300 cm/s LVOT VTI:    0.192 m  AORTA Ao Root diam: 3.20 cm MITRAL VALVE MV Area (PHT): 5.27 cm    SHUNTS MV Decel Time: 144 msec    Systemic VTI:  0.19 m MV E velocity: 60.80 cm/s  Systemic Diam: 2.10 cm MV A velocity: 79.70 cm/s MV E/A ratio:  0.76 Glori Bickers MD Electronically signed by Glori Bickers MD Signature Date/Time: 08/20/2020/10:58:53 AM    Final

## 2020-09-10 MED FILL — OMEPRAZOLE 40 MG CPDR: 40 | 30 days supply | Qty: 60 | Fill #1

## 2020-09-10 MED FILL — DULOXETINE HCL 20 MG CPEP: 20 | 30 days supply | Qty: 60 | Fill #1

## 2020-09-10 MED FILL — GABAPENTIN 300 MG CAPSULE: 300 | 30 days supply | Qty: 270 | Fill #1

## 2020-09-10 MED FILL — SPIRONOLACTONE 25 MG TABS: 25 | 90 days supply | Qty: 90 | Fill #3

## 2020-09-10 MED FILL — XARELTO 20 MG TABLET: 20 | 30 days supply | Qty: 30 | Fill #4

## 2020-09-11 ENCOUNTER — Telehealth: Payer: Self-pay | Admitting: Pulmonary Disease

## 2020-09-11 ENCOUNTER — Telehealth: Payer: Self-pay | Admitting: General Practice

## 2020-09-11 ENCOUNTER — Other Ambulatory Visit: Payer: Self-pay | Admitting: Pulmonary Disease

## 2020-09-11 MED ORDER — BREZTRI AEROSPHERE 160-9-4.8 MCG/ACT IN AERO: 2.0000 | INHALATION_SPRAY | Freq: Two times a day (BID) | RESPIRATORY_TRACT | 6 refills | Status: DC

## 2020-09-11 NOTE — Telephone Encounter (Signed)
Teresa Hays CSW Progress Notes  Call from patient, she has questions about her transition to Medicare and what this means for her current Medicaid coverage.  Directed her to her county DSS office as undersigned CSW does not have information on this process.  She knows her Medicaid worker's contact information - this is who she needs to work with and ask questions of.  Teresa Shell, LCSW Clinical Social Worker Phone:  [phone]

## 2020-09-11 NOTE — Telephone Encounter (Signed)
rx for the breztri has been sent to the pharmacy per pts request.

## 2020-09-12 ENCOUNTER — Encounter: Payer: Self-pay | Admitting: General Practice

## 2020-09-12 ENCOUNTER — Encounter: Payer: Self-pay | Admitting: Hematology

## 2020-09-12 NOTE — Progress Notes (Signed)
Pt called inquiring about assistance to help pay for her medications because her Medicaid will exp on 09/16/20 and she will have Medicare w/o drug coverage.  I informed her unfortunately she has exhausted all of her [employer] funds and cannot reapply since it is a one time grant.  I asked which medication did she need assistance for, she said all of them.  I told her I would need the names of the medications to see if there are any foundations offering assistance for them and we would have to wait to apply after her Medicaid expires.  She verbalized understanding and will get back to me with the names of the medications.

## 2020-09-12 NOTE — Progress Notes (Signed)
[place] CSW Progress Notes  Call from patient - she is transitioning to Medicare and will no longer income qualify for Medicaid.  She has inquired at [employer] and has been told she is over income for Medicaid.  She is working Therapist, sports at [employer] - they have told her to ask at [facility] if there is any option for assistance w cost of her drugs when she transitions to [employer].  Message will be sent to her Financial Advocate L White as Estate manager/land agent may be able to assist w any options for drug cost assistance.  She was also advised to contact [employer], a national organization that assists/informs of legal, financial and insurance information related to cancer treatment.  When she does have Medicare and begins to accrue bills, she can apply to [employer] [place] for help with a small amount of need.    Edwyna Shell, LCSW Clinical Social Worker Phone:  [phone]

## 2020-09-16 ENCOUNTER — Telehealth: Payer: Self-pay | Admitting: Pulmonary Disease

## 2020-09-17 NOTE — Progress Notes (Signed)
[place]   [phone] [phone]   Clinic Follow up Note   Patient Care Team: [name], DO as PCP - General (Family Medicine) [name], MD as Consulting Physician (Gastroenterology) [name], MD as Consulting Physician (Hematology)  Date of Service:  09/19/2020  CHIEF COMPLAINT: F/u for metastatic breast cancer  SUMMARY OF ONCOLOGIC HISTORY: Oncology History Overview Note  Cancer Staging Metastatic breast cancer [facility]) Staging form: Breast, AJCC 8th Edition - Clinical stage from 03/24/2018: Stage IV (cT2, cN1, pM1, G3, ER+, PR+, HER2+) - Signed by [name], MD on 03/30/2018     Metastatic breast cancer ([place])  01/30/2018 Procedure   Colonoscopy showed small polyp in the sigmoid colon, removed, the exam of colon including the terminal ileum was otherwise negative.   01/30/2018 Procedure   EGD by Dr. Collene Mares showed small hiatal hernia, a 8 mm polypoid lesion in the cardia, biopsied.   03/09/2018 Imaging   03/09/2018 US Abdomen IMPRESSION: 1. Mass lesions throughout the liver, consistent with metastatic disease. Liver as a somewhat nodular contour suggesting underlying hepatic cirrhosis. Inhomogeneous echotexture to the liver.  2. Cholelithiasis with mild gallbladder wall thickening. A degree of cholecystitis cannot be excluded by ultrasound.  3. Portions of pancreas obscured by gas. Visualized portions of pancreas appear normal.  4. Small right kidney. Etiology uncertain. This finding potentially may be indicative of renal artery stenosis. In this regard, question whether patient is hypertensive.   03/15/2018 Imaging   CT CAP with contrast  IMPRESSION: 1. Widespread hepatic metastasis. 2. 2.6 cm lateral right breast soft tissue nodule could represent a breast primary or an incidental benign lesion. Consider correlation with mammogram and ultrasound. 3. No definite source of primary malignancy identified within the abdomen or  pelvis. There is possible rectosigmoid junction wall thickening. Consider colonoscopy with attention to this area. 4. Distal esophageal wall thickening, suggesting esophagitis.   03/24/2018 Cancer Staging   Staging form: Breast, AJCC 8th Edition - Clinical stage from 03/24/2018: Stage IV (cT2, cN1, pM1, G3, ER+, PR+, HER2+) - Signed by [name], MD on 03/30/2018   03/24/2018 Initial Biopsy   Diagnosis 1. 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 Breast, right, needle core biopsy, 9 o'clock, 7cm from nipple - INVASIVE DUCTAL CARCINOMA. -The carcinoma is somewhat morphologically dissimilar from that in part 1. It appears grade III 3. Lymph node, needle/core biopsy, right axillary - METASTATIC CARCINOMA IN 1 OF 1 LYMPH NODE (1/1).   03/24/2018 Receptors her2   Breast biopsy: 1. Estrogen Receptor: 40%, POSITIVE, STRONG-MODERATE STAINING INTENSITY Progesterone Receptor: 70%, POSITIVE, STRONG STAINING INTENSITY Proliferation Marker Ki67: 20% HER 2 equivocal by IHC 2+, POSITIVE by FISH, ratio 2.4 and copy #4.2  2. Estrogen Receptor: 60%, POSITIVE, MODERATE STAINING INTENSITY Progesterone Receptor: 40%, POSITIVE, MODERATE STAINING INTENSITY Proliferation Marker Ki67: 20% HER2 (+) by IHC 3+   03/24/2018 Initial Diagnosis   Metastatic breast cancer ([place])   03/27/2018 Pathology Results   Diagnosis Liver, needle/core biopsy, Right - METASTATIC CARCINOMA TO LIVER, CONSISTENT WITH PATIENTS CLINICAL HISTORY OF PRIMARY BREAST CARCINOMA.  ER 80%+ PR40%+ HER2- (by [facility], IHC 2+)  Ki67 50%    03/28/2018 Pathology Results   03/28/2018 Surgical Pathology Diagnosis 1. Breast, left, needle core biopsy, 9 o'clock - FIBROCYSTIC CHANGES. - THERE IS NO EVIDENCE OF MALIGNANCY. 2. Breast, left, needle core biopsy, 2 o'clock - FIBROADENOMA. - THERE IS NO EVIDENCE OF  MALIGNANCY. - SEE COMMENT.   03/29/2018 Imaging    03/29/2018 Bone Scan IMPRESSION: No scintigraphic evidence of osseous metastatic disease.   03/30/2018 Imaging   Bone scan  IMPRESSION: No scintigraphic evidence of osseous metastatic disease.    04/07/2018 - 07/30/2020 Chemotherapy   First line chemo weekly Taxol and herceptin/Perjeta every 3 weeks starting 04/07/18. She developed infusion reaction to taxol and it was discontinued. Added Abraxane on C1D8, 2 weeks on/1 week off.  Abraxne stopped after 10/13/18 due to worsening Neuropathy. She has continued with maintenance Herceptin injection/Perjeta q3weeks. Herceptin changed to injection on 04/06/19. Both injections switched to combination [place] on 10/01/19. ----Stopped 07/30/20 due to disease progression in liver.    06/06/2018 Imaging   CT CAP IMPRESSION: 1. Generally improved appearance, with reduced axillary adenopathy and reduced enhancing component of the hepatic masses, with some of the hepatic mass is moderately smaller than on the prior exam. Reduced size of the right lateral breast mass compared to the prior 03/15/2018 exam. 2. New mild interstitial accentuation in the lungs, significance uncertain. Part of this appearance may be due to lower lung volumes on today's exam. 3. Mild wall thickening in the descending colon and upper rectum suggesting low-grade colitis/inflammation. Prominent stool throughout the colon favors constipation. 4. Other imaging findings of potential clinical significance: Aortic Atherosclerosis (ICD10-I70.0). Mild cardiomegaly. Mild nodularity in the right lower lobe appears stable. Contracted and thick-walled gallbladder.    09/18/2018 Imaging   CT CAP W Contrast 09/18/18  IMPRESSION: 1. Liver metastases have decreased in size. 2. Right breast mass, mild right axillary lymphadenopathy and scattered tiny right pulmonary nodules are all stable. 3. New mild left supraclavicular and left subpectoral lymphadenopathy, can not exclude progression of metastatic  nodal disease. 4. Moderate colorectal stool volume, which may indicate constipation. 5.  Aortic Atherosclerosis (ICD10-I70.0).   10/2018 - 07/30/2020 Anti-estrogen oral therapy   Letrozole 2.5 mg daily starting 10/2018 D/c to proceed with Inhertu treatment after liver met progression.    01/10/2019 Imaging   CT CAP W Contrast 01/10/19  IMPRESSION: 1. Continued improvement in the hepatic metastatic lesions which have reduced in size. 2. Stable mild left supraclavicular and subpectoral adenopathy. 3. Essentially stable small right lower lobe pulmonary nodule and separate small subpleural nodule along the right hemidiaphragm. Surveillance suggested. 4. Other imaging findings of potential clinical significance: Mild cardiomegaly. Mild circumferential distal esophageal wall thickening, the most common cause would be esophagitis. Airway thickening is present, suggesting bronchitis or reactive airways disease. Airway plugging in the lower lobes and in the right middle lobe. Stable small right adrenal adenoma.   05/15/2019 Imaging   CT CAP W Contrast 05/15/19  IMPRESSION: CT CHEST IMPRESSION   1. Similar appearance of borderline supraclavicular, axillary, and subpectoral adenopathy. 2. Improved and resolved right lower lobe pulmonary nodularity. 3. Esophageal air fluid level suggests dysmotility or gastroesophageal reflux.   CT ABDOMEN AND PELVIS IMPRESSION   1. Improved hepatic metastasis. 2. Small bowel mesenteric lymph nodes which are upper normal and mildly enlarged. Likely increased and similar as detailed above. Indeterminate. Recommend attention on follow-up. 3. Cholelithiasis. 4. Motion degradation throughout the lower chest and abdomen.   09/19/2019 Imaging   CT CAP W contrast  IMPRESSION: 1. Interval decrease in size of right axillary and subpectoral lymph nodes. There are no pathologically enlarged lymph nodes remaining in the chest, abdomen, or pelvis. No new  lymphadenopathy. 2. No significant change in post treatment appearance of multiple low-attenuation liver lesions, in keeping with treated metastases.  3. No evidence of new metastatic disease in the chest, abdomen, or pelvis. 4. Aortic Atherosclerosis (ICD10-I70.0).   11/16/2019 Imaging   IMRI Brain  MPRESSION: Minimal chronic microvascular ischemic changes. No acute intracranial process.   Active bilateral maxillary sinus disease with mild frontoethmoid mucosal thickening.   01/23/2020 Imaging   CT CAP W contrast  IMPRESSION: 1. Stable exam. No new or progressive interval findings. 2. Stable appearance of upper normal right axillary lymph nodes. Tiny subpectoral and left axillary nodes are unchanged. 3. Generally similar appearance of ill-defined hypoattenuating lesions in the liver compatible with treated metastases. 1 lesion in the central liver is minimally more conspicuous today, likely related to bolus timing and attention on follow-up recommended. No new suspicious liver lesion on today's study. 4. Stable 10 mm right adrenal nodule., indeterminate. Continued attention on follow-up imaging recommended. 5. Cholelithiasis. 6. Aortic Atherosclerosis (ICD10-I70.0).   02/11/2020 Breast MRI   IMPRESSION: 1. 7 millimeter focus of residual enhancement associated with the known malignancy in the 9 o'clock location of the RIGHT breast. 2. No significant enhancement in the known malignancy in the 11:30 o'clock location of the RIGHT breast. 3. Interval resolution of axillary adenopathy.   07/28/2020 Imaging   CT CAP  IMPRESSION: 1. Interval progression of hepatic metastases. 2. Stable indeterminate right adrenal nodule. 3. No new sites of metastatic disease in the chest, abdomen or pelvis. 4. Chronic findings include: Small hiatal hernia. Cholelithiasis. Aortic Atherosclerosis (ICD10-I70.0).     08/08/2020 -  Chemotherapy   Second-line Inhertu q3weeks starting 08/08/20        CURRENT THERAPY:  Second-lineEnhertu q3weeks starting1/21/22. Due to poor toleration with C1, she required dose reduction from C2.   INTERVAL HISTORY:  Teresa Hays is here for a follow up. She presents to the clinic alone. She notes first week of second cycle she had an episode of syncope after feelings of lightheadedness, and dizziness. When she stood up she passed out and fell back on couch. She was out for a few minutes and her daughter called EMT and vital was normal when checked. She was seen by PA Vann on 2/18 and given IV fluids. She attributes her syncope to vertigo. She notes otherwise her second cycle went well. She denies diarrhea this time.     REVIEW OF SYSTEMS:   Constitutional: Denies fevers, chills or abnormal weight loss Eyes: Denies blurriness of vision Ears, nose, mouth, throat, and face: Denies mucositis or sore throat Respiratory: Denies cough, dyspnea or wheezes Cardiovascular: Denies palpitation, chest discomfort or lower extremity swelling Gastrointestinal:  Denies nausea, heartburn or change in bowel habits Skin: Denies abnormal skin rashes Lymphatics: Denies new lymphadenopathy or easy bruising Neurological:Denies numbness, tingling or new weaknesses Behavioral/Psych: Mood is stable, no new changes  All other systems were reviewed with the patient and are negative.  MEDICAL HISTORY:  Past Medical History:  Diagnosis Date  . GERD (gastroesophageal reflux disease)   . Hypertension   . Personal history of chemotherapy   . rt breast ca with mets to liver dx'd 03/2018    SURGICAL HISTORY: Past Surgical History:  Procedure Laterality Date  . BREAST BIOPSY Right 03/24/2018   CA x3  . BREAST BIOPSY Left 03/28/2018   neg  . BREAST BIOPSY Left 03/30/2018   neg  . COLONOSCOPY    . ESOPHAGOGASTRODUODENOSCOPY ENDOSCOPY    . IR IMAGING GUIDED PORT INSERTION  04/04/2018    I have reviewed the social history and family history with the patient and  they are unchanged from previous note.  ALLERGIES:  has No Known Allergies.  MEDICATIONS:  Current Outpatient Medications  Medication Sig Dispense Refill  . amLODipine (NORVASC) 5 MG tablet TAKE 1 TABLET BY MOUTH DAILY. 30 tablet 2  . Budeson-Glycopyrrol-Formoterol (BREZTRI AEROSPHERE) 160-9-4.8 MCG/ACT AERO Inhale 2 puffs into the lungs in the morning and at bedtime. 10.7 g 6  . carvedilol (COREG) 3.125 MG tablet TAKE 1 TABLET BY MOUTH TWICE DAILY. REPLACES ATENOLOL 180 tablet 3  . diphenoxylate-atropine (LOMOTIL) 2.5-0.025 MG tablet Take 1-2 tablets by mouth 4 (four) times daily as needed for diarrhea or loose stools. 60 tablet 1  . DULoxetine (CYMBALTA) 20 MG capsule TAKE 2 CAPSULES BY MOUTH DAILY 60 capsule 3  . gabapentin (NEURONTIN) 300 MG capsule May take up to 3 capsules three times daily 270 capsule 3  . lidocaine (XYLOCAINE) 2 % jelly Apply 1 application topically as needed.    Marland Kitchen losartan (COZAAR) 50 MG tablet Take 2 tablets (100 mg total) by mouth daily. 90 tablet 3  . meclizine (ANTIVERT) 12.5 MG tablet Take 1 tablet (12.5 mg total) by mouth 3 (three) times daily as needed for dizziness. 30 tablet 2  . montelukast (SINGULAIR) 10 MG tablet TAKE 1 TABLET (10 MG TOTAL) BY MOUTH AT BEDTIME. 30 tablet 5  . omeprazole (PRILOSEC) 40 MG capsule TAKE 1 CAPSULE BY MOUTH TWICE DAILY 60 capsule 3  . ondansetron (ZOFRAN) 8 MG tablet Take 1 tablet (8 mg total) by mouth every 8 (eight) hours as needed for nausea or vomiting. 30 tablet 3  . potassium chloride SA (KLOR-CON) 20 MEQ tablet TAKE 1 TABLET BY MOUTH ONCE DAILY 30 tablet 3  . prochlorperazine (COMPAZINE) 10 MG tablet Take 1 tablet (10 mg total) by mouth every 6 (six) hours as needed for nausea or vomiting. 30 tablet 3  . rivaroxaban (XARELTO) 20 MG TABS tablet TAKE 1 TABLET BY MOUTH DAILY WITH SUPPER 30 tablet 5  . spironolactone (ALDACTONE) 25 MG tablet TAKE 1 TABLET BY MOUTH ONCE DAILY 90 tablet 3  . traMADol (ULTRAM) 50 MG tablet Take  1 tablet (50 mg total) by mouth every 6 (six) hours as needed for moderate pain or severe pain. 30 tablet 0   No current facility-administered medications for this visit.   Facility-Administered Medications Ordered in Other Visits  Medication Dose Route Frequency Provider Last Rate Last Admin  . sodium chloride flush (NS) 0.9 % injection 10 mL  10 mL Intracatheter PRN [name], MD   10 mL at 05/28/20 0935    PHYSICAL EXAMINATION: ECOG PERFORMANCE STATUS: 1 - Symptomatic but completely ambulatory  Vitals:   09/19/20 0806  BP: 120/85  Pulse: 75  Resp: 18  Temp: (!) 96.1 F (35.6 C)  SpO2: 100%   Filed Weights   09/19/20 0806  Weight: 147 lb 6.4 oz (66.9 kg)    Due to COVID19 we will limit examination to appearance. Patient had no complaints.  GENERAL:alert, no distress and comfortable SKIN: skin color normal, no rashes or significant lesions EYES: normal, Conjunctiva are pink and non-injected, sclera clear  NEURO: alert & oriented x 3 with fluent speech   LABORATORY DATA:  I have reviewed the data as listed CBC Latest Ref Rng & Units 09/19/2020 09/05/2020 08/29/2020  WBC 4.0 - 10.5 K/uL 5.7 9.3 8.3  Hemoglobin 12.0 - 15.0 g/dL 9.4(L) 10.2(L) 9.8(L)  Hematocrit 36.0 - 46.0 % 29.0(L) 30.9(L) 30.9(L)  Platelets 150 - 400 K/uL 246 325 355  CMP Latest Ref Rng & Units 09/05/2020 08/29/2020 08/15/2020  Glucose 70 - 99 mg/dL 97 103(H) 126(H)  BUN 8 - 23 mg/dL 16 11 20   Creatinine 0.44 - 1.00 mg/dL 1.53(H) 1.34(H) 1.77(H)  Sodium 135 - 145 mmol/L 131(L) 134(L) 128(L)  Potassium 3.5 - 5.1 mmol/L 4.5 4.3 5.0  Chloride 98 - 111 mmol/L 101 103 97(L)  CO2 22 - 32 mmol/L 22 24 24   Calcium 8.9 - 10.3 mg/dL 9.1 9.5 9.0  Total Protein 6.5 - 8.1 g/dL 7.0 6.7 6.8  Total Bilirubin 0.3 - 1.2 mg/dL 0.3 <0.2(L) 0.6  Alkaline Phos 38 - 126 U/L 145(H) 138(H) 171(H)  AST 15 - 41 U/L 27 22 29   ALT 0 - 44 U/L 28 20 44      RADIOGRAPHIC STUDIES: I have personally reviewed the radiological  images as listed and agreed with the findings in the report. No results found.   ASSESSMENT & PLAN:  Teresa Hays is a 65 y.o. female with    1. 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 Liver met was HER2-.  -Given the metastatic disease, her cancer is not curable but still treatable. -She had been treated withfirst linemaintenancetreatment with letrozole,trastuzumab and Perjeta, until recent disease progression on 07/28/20 CT CAP.  -I started her on second-lineEnhertuq3weeks starting 08/08/20. Given poor toleration with C1 (nausea, diarrhea, poor appetite), dose reduced with C2. Plan to scan after 3-4 months of treatment.  -She tolerated C2 treatment well except an episode of syncope 5 days after treatment. I advised her to remain hydrated and encouraged her to manage her BP on medication.  -Labs reviewed, Hg 9.4. Labs overall adequate to proceed with C3 Enhertu today at same dose  -F/u in 3 weeks    2.Peripheral neuropathy, grade 2, Right Foot pain,Secondarytochemotherapy -Ipreviouslystropped Abraxane on 10/13/18. -S/p PT and steroid injection her right foot pain is controlled. She still has mild tingling neuropathy in her toes an fingers. She has adequate function and denies falls. -She will also continue OTC Calcium and Vit D,OralB12, Gabapentin 300mg 3 tabsTID andCymbalta to 40mg , Tramadol every other night for right foot pain.Well controlled. 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 Anemia, Secondary to chemo -Continue monitoring, will consider blood transfusion if hemoglobin less than 8. -Mild and stable.  5. Goal of Care Discussion, Social  support,  -She lives alone, has her sister and niece in [place]. Her daughter lives in a few hours away  -On Ativan PRN due to anxietyabout diagnosis and treatment -The patient understands the goal of care is palliative. -She is full code  6.Asthma -She has been seen by Dr. Valeta Harms who notes this is related to her post nasal drip and her Acid reflux can contribute. -She is onPrilosec2 tabs BID,allergymedicationand nasal spray and inhalers.Her insurance denied Dexilant.  -Continue to f/u withDr Icard and Dr Collene Mares andpulmonary clinic. -Her breathing has been doing better. Continue tof/u with Dr Valeta Harms for better control.   7. Syncope, HTN  -During week 1 of C2 Enhertu she notes episode of syncope.  -She is on Amlodipine and losartan and Coreg. If her systolic BP is below 354, she can hold amlodipine and losartan until in better range. I encouraged her to monitor her BP at home  -I encouraged her  to drink more fluids, especially tonic water    Plan: -Labs reviewed and adequate to proceed with C3 Enhertu today at same dose  -Lab, flush, F/u and Enhertu in 3 and 6 weeks  -BP will monitor BP at home and hold BP meds if SBP<100    No problem-specific Assessment & Plan notes found for this encounter.   No orders of the defined types were placed in this encounter.  All questions were answered. The patient knows to call the clinic with any problems, questions or concerns. No barriers to learning was detected. The total time spent in the appointment was 30 minutes.     [name], MD 09/19/2020   I, Joslyn Devon, am acting as scribe for [name], MD.   I have reviewed the above documentation for accuracy and completeness, and I agree with the above.

## 2020-09-17 NOTE — Telephone Encounter (Signed)
Call made to medicaid to initiate PA for [facility]. Spoke with Freda Munro, she reports this patient medicaid policy termed in Feb of this year.   Call made to [facility] to make aware, she reports they recently ran her pro-air through her medicaid and it was covered.   Attempted to contact patient, phone line just rings and rings, no voicemail kicks in. Will hold in triage and try back later.

## 2020-09-18 NOTE — Telephone Encounter (Signed)
LMTCB

## 2020-09-19 ENCOUNTER — Inpatient Hospital Stay (HOSPITAL_BASED_OUTPATIENT_CLINIC_OR_DEPARTMENT_OTHER): Payer: Medicare Other | Admitting: Hematology

## 2020-09-19 ENCOUNTER — Inpatient Hospital Stay: Payer: Medicare Other

## 2020-09-19 ENCOUNTER — Inpatient Hospital Stay: Payer: Medicare Other | Attending: Hematology

## 2020-09-19 ENCOUNTER — Other Ambulatory Visit: Payer: Self-pay

## 2020-09-19 ENCOUNTER — Encounter: Payer: Self-pay | Admitting: Hematology

## 2020-09-19 ENCOUNTER — Telehealth: Payer: Self-pay

## 2020-09-19 ENCOUNTER — Telehealth: Payer: Self-pay | Admitting: Hematology

## 2020-09-19 VITALS — BP 120/85 | HR 75 | Temp 96.1°F | Resp 18 | Ht 64.0 in | Wt 147.4 lb

## 2020-09-19 DIAGNOSIS — Z79811 Long term (current) use of aromatase inhibitors: Secondary | ICD-10-CM | POA: Diagnosis not present

## 2020-09-19 DIAGNOSIS — J45909 Unspecified asthma, uncomplicated: Secondary | ICD-10-CM | POA: Diagnosis not present

## 2020-09-19 DIAGNOSIS — Z79899 Other long term (current) drug therapy: Secondary | ICD-10-CM | POA: Diagnosis not present

## 2020-09-19 DIAGNOSIS — R630 Anorexia: Secondary | ICD-10-CM | POA: Diagnosis not present

## 2020-09-19 DIAGNOSIS — I700 Atherosclerosis of aorta: Secondary | ICD-10-CM | POA: Insufficient documentation

## 2020-09-19 DIAGNOSIS — R42 Dizziness and giddiness: Secondary | ICD-10-CM | POA: Diagnosis not present

## 2020-09-19 DIAGNOSIS — C787 Secondary malignant neoplasm of liver and intrahepatic bile duct: Secondary | ICD-10-CM | POA: Diagnosis not present

## 2020-09-19 DIAGNOSIS — D6481 Anemia due to antineoplastic chemotherapy: Secondary | ICD-10-CM | POA: Diagnosis not present

## 2020-09-19 DIAGNOSIS — R197 Diarrhea, unspecified: Secondary | ICD-10-CM | POA: Diagnosis not present

## 2020-09-19 DIAGNOSIS — K449 Diaphragmatic hernia without obstruction or gangrene: Secondary | ICD-10-CM | POA: Diagnosis not present

## 2020-09-19 DIAGNOSIS — C50811 Malignant neoplasm of overlapping sites of right female breast: Secondary | ICD-10-CM | POA: Diagnosis present

## 2020-09-19 DIAGNOSIS — Z9221 Personal history of antineoplastic chemotherapy: Secondary | ICD-10-CM | POA: Diagnosis not present

## 2020-09-19 DIAGNOSIS — R55 Syncope and collapse: Secondary | ICD-10-CM | POA: Diagnosis not present

## 2020-09-19 DIAGNOSIS — T451X5A Adverse effect of antineoplastic and immunosuppressive drugs, initial encounter: Secondary | ICD-10-CM | POA: Diagnosis not present

## 2020-09-19 DIAGNOSIS — Z5112 Encounter for antineoplastic immunotherapy: Secondary | ICD-10-CM | POA: Diagnosis present

## 2020-09-19 DIAGNOSIS — Z170 Estrogen receptor positive status [ER+]: Secondary | ICD-10-CM | POA: Diagnosis not present

## 2020-09-19 DIAGNOSIS — C50919 Malignant neoplasm of unspecified site of unspecified female breast: Secondary | ICD-10-CM

## 2020-09-19 DIAGNOSIS — G620 Drug-induced polyneuropathy: Secondary | ICD-10-CM | POA: Diagnosis not present

## 2020-09-19 DIAGNOSIS — Z7901 Long term (current) use of anticoagulants: Secondary | ICD-10-CM | POA: Insufficient documentation

## 2020-09-19 DIAGNOSIS — C773 Secondary and unspecified malignant neoplasm of axilla and upper limb lymph nodes: Secondary | ICD-10-CM | POA: Insufficient documentation

## 2020-09-19 DIAGNOSIS — K8020 Calculus of gallbladder without cholecystitis without obstruction: Secondary | ICD-10-CM | POA: Diagnosis not present

## 2020-09-19 DIAGNOSIS — K2100 Gastro-esophageal reflux disease with esophagitis, without bleeding: Secondary | ICD-10-CM | POA: Diagnosis not present

## 2020-09-19 DIAGNOSIS — J320 Chronic maxillary sinusitis: Secondary | ICD-10-CM | POA: Insufficient documentation

## 2020-09-19 DIAGNOSIS — Z7189 Other specified counseling: Secondary | ICD-10-CM

## 2020-09-19 DIAGNOSIS — Z95828 Presence of other vascular implants and grafts: Secondary | ICD-10-CM

## 2020-09-19 LAB — CBC WITH DIFFERENTIAL (CANCER CENTER ONLY)
Abs Immature Granulocytes: 0.02 10*3/uL (ref 0.00–0.07)
Basophils Absolute: 0.1 10*3/uL (ref 0.0–0.1)
Basophils Relative: 1 %
Eosinophils Absolute: 0.3 10*3/uL (ref 0.0–0.5)
Eosinophils Relative: 5 %
HCT: 29 % — ABNORMAL LOW (ref 36.0–46.0)
Hemoglobin: 9.4 g/dL — ABNORMAL LOW (ref 12.0–15.0)
Immature Granulocytes: 0 %
Lymphocytes Relative: 37 %
Lymphs Abs: 2.1 10*3/uL (ref 0.7–4.0)
MCH: 28.6 pg (ref 26.0–34.0)
MCHC: 32.4 g/dL (ref 30.0–36.0)
MCV: 88.1 fL (ref 80.0–100.0)
Monocytes Absolute: 0.4 10*3/uL (ref 0.1–1.0)
Monocytes Relative: 6 %
Neutro Abs: 2.9 10*3/uL (ref 1.7–7.7)
Neutrophils Relative %: 51 %
Platelet Count: 246 10*3/uL (ref 150–400)
RBC: 3.29 MIL/uL — ABNORMAL LOW (ref 3.87–5.11)
RDW: 15.2 % (ref 11.5–15.5)
WBC Count: 5.7 10*3/uL (ref 4.0–10.5)
nRBC: 0 % (ref 0.0–0.2)

## 2020-09-19 LAB — CMP (CANCER CENTER ONLY)
ALT: 19 U/L (ref 10–47)
AST: 27 U/L (ref 11–38)
Albumin: 3.2 g/dL — ABNORMAL LOW (ref 3.5–5.0)
Alkaline Phosphatase: 117 U/L (ref 38–126)
Anion gap: 10 (ref 5–15)
BUN: 13 mg/dL (ref 8–23)
CO2: 22 mmol/L (ref 22–32)
Calcium: 9.3 mg/dL (ref 8.9–10.3)
Chloride: 103 mmol/L (ref 98–111)
Creatinine: 1.25 mg/dL — ABNORMAL HIGH (ref 0.60–1.20)
GFR, Estimated: 48 mL/min — ABNORMAL LOW (ref >60)
Glucose, Bld: 115 mg/dL — ABNORMAL HIGH (ref 70–99)
Potassium: 4.3 mmol/L (ref 3.5–5.1)
Sodium: 135 mmol/L (ref 135–145)
Total Bilirubin: 0.2 mg/dL (ref 0.2–1.6)
Total Protein: 6.4 g/dL — ABNORMAL LOW (ref 6.5–8.1)

## 2020-09-19 MED ORDER — DIPHENHYDRAMINE HCL 25 MG PO CAPS
ORAL_CAPSULE | ORAL | Status: AC
  Filled 2020-09-19: qty 1

## 2020-09-19 MED ORDER — SODIUM CHLORIDE 0.9% FLUSH
10.0000 mL | INTRAVENOUS | Status: DC | PRN
  Administered 2020-09-19: 10 mL
  Filled 2020-09-19: qty 10

## 2020-09-19 MED ORDER — ACETAMINOPHEN 325 MG PO TABS
650.0000 mg | ORAL_TABLET | Freq: Once | ORAL | Status: AC
  Administered 2020-09-19: 650 mg via ORAL

## 2020-09-19 MED ORDER — ACETAMINOPHEN 325 MG PO TABS
ORAL_TABLET | ORAL | Status: AC
  Filled 2020-09-19: qty 2

## 2020-09-19 MED ORDER — DIPHENHYDRAMINE HCL 25 MG PO CAPS
50.0000 mg | ORAL_CAPSULE | Freq: Once | ORAL | Status: AC
  Administered 2020-09-19: 50 mg via ORAL

## 2020-09-19 MED ORDER — PALONOSETRON HCL INJECTION 0.25 MG/5ML
0.2500 mg | Freq: Once | INTRAVENOUS | Status: AC
  Administered 2020-09-19: 0.25 mg via INTRAVENOUS

## 2020-09-19 MED ORDER — PALONOSETRON HCL INJECTION 0.25 MG/5ML
INTRAVENOUS | Status: AC
  Filled 2020-09-19: qty 5

## 2020-09-19 MED ORDER — DEXTROSE 5 % IV SOLN
Freq: Once | INTRAVENOUS | Status: AC
  Filled 2020-09-19: qty 250

## 2020-09-19 MED ORDER — HEPARIN SOD (PORK) LOCK FLUSH 100 UNIT/ML IV SOLN
500.0000 [IU] | Freq: Once | INTRAVENOUS | Status: AC | PRN
  Administered 2020-09-19: 500 [IU]
  Filled 2020-09-19: qty 5

## 2020-09-19 MED ORDER — DIPHENHYDRAMINE HCL 25 MG PO CAPS
ORAL_CAPSULE | ORAL | Status: AC
  Filled 2020-09-19: qty 2

## 2020-09-19 MED ORDER — FAM-TRASTUZUMAB DERUXTECAN-NXKI CHEMO 100 MG IV SOLR
300.0000 mg | Freq: Once | INTRAVENOUS | Status: AC
  Administered 2020-09-19: 300 mg via INTRAVENOUS
  Filled 2020-09-19: qty 15

## 2020-09-19 MED ORDER — SODIUM CHLORIDE 0.9 % IV SOLN
10.0000 mg | Freq: Once | INTRAVENOUS | Status: AC
  Administered 2020-09-19: 10 mg via INTRAVENOUS
  Filled 2020-09-19: qty 10

## 2020-09-19 NOTE — Telephone Encounter (Signed)
Scheduled per los. Gave avs and calendar  

## 2020-09-19 NOTE — Telephone Encounter (Signed)
ATC, NA and will close per protocol

## 2020-09-19 NOTE — Telephone Encounter (Signed)
Pharmacy name: [facility] outpatient Drug requested: Breztri [facility]?: yes Key: [id number] Covered alternatives: on file Tried and failed:  Decision: sent to plan- routing to [facility] for follow up.

## 2020-09-19 NOTE — Patient Instructions (Signed)
Implanted Port Insertion, Care After This sheet gives you information about how to care for yourself after your procedure. Your health care provider may also give you more specific instructions. If you have problems or questions, contact your health care provider. What can I expect after the procedure? After the procedure, it is common to have:  Discomfort at the port insertion site.  Bruising on the skin over the port. This should improve over 3-4 days. Follow these instructions at home: Port care  After your port is placed, you will get a manufacturer's information card. The card has information about your port. Keep this card with you at all times.  Take care of the port as told by your health care provider. Ask your health care provider if you or a family member can get training for taking care of the port at home. A home health care nurse may also take care of the port.  Make sure to remember what type of port you have. Incision care  Follow instructions from your health care provider about how to take care of your port insertion site. Make sure you: ? Wash your hands with soap and water before and after you change your bandage (dressing). If soap and water are not available, use hand sanitizer. ? Change your dressing as told by your health care provider. ? Leave stitches (sutures), skin glue, or adhesive strips in place. These skin closures may need to stay in place for 2 weeks or longer. If adhesive strip edges start to loosen and curl up, you may trim the loose edges. Do not remove adhesive strips completely unless your health care provider tells you to do that.  Check your port insertion site every day for signs of infection. Check for: ? Redness, swelling, or pain. ? Fluid or blood. ? Warmth. ? Pus or a bad smell.      Activity  Return to your normal activities as told by your health care provider. Ask your health care provider what activities are safe for you.  Do not  lift anything that is heavier than 10 lb (4.5 kg), or the limit that you are told, until your health care provider says that it is safe. General instructions  Take over-the-counter and prescription medicines only as told by your health care provider.  Do not take baths, swim, or use a hot tub until your health care provider approves. Ask your health care provider if you may take showers. You may only be allowed to take sponge baths.  Do not drive for 24 hours if you were given a sedative during your procedure.  Wear a medical alert bracelet in case of an emergency. This will tell any health care providers that you have a port.  Keep all follow-up visits as told by your health care provider. This is important. Contact a health care provider if:  You cannot flush your port with saline as directed, or you cannot draw blood from the port.  You have a fever or chills.  You have redness, swelling, or pain around your port insertion site.  You have fluid or blood coming from your port insertion site.  Your port insertion site feels warm to the touch.  You have pus or a bad smell coming from the port insertion site. Get help right away if:  You have chest pain or shortness of breath.  You have bleeding from your port that you cannot control. Summary  Take care of the port as told by your   health care provider. Keep the manufacturer's information card with you at all times.  Change your dressing as told by your health care provider.  Contact a health care provider if you have a fever or chills or if you have redness, swelling, or pain around your port insertion site.  Keep all follow-up visits as told by your health care provider. This information is not intended to replace advice given to you by your health care provider. Make sure you discuss any questions you have with your health care provider. Document Revised: 01/31/2018 Document Reviewed: 01/31/2018 Elsevier Patient Education   2021 [employer]  

## 2020-09-20 LAB — CANCER ANTIGEN 27.29: CA 27.29: 203.9 U/mL — ABNORMAL HIGH (ref 0.0–38.6)

## 2020-09-22 ENCOUNTER — Other Ambulatory Visit: Payer: Self-pay | Admitting: Hematology

## 2020-09-22 MED FILL — MECLIZINE HCL 25 MG TABS: 25 | 10 days supply | Qty: 15 | Fill #1

## 2020-09-22 MED FILL — AMLODIPINE BESYLATE 5 MG TA: 5 | 30 days supply | Qty: 30 | Fill #2

## 2020-09-22 MED FILL — MONTELUKAST SOD 10 MG TAB: 10 | 30 days supply | Qty: 30 | Fill #4

## 2020-09-23 ENCOUNTER — Telehealth: Payer: Self-pay

## 2020-09-23 NOTE — Telephone Encounter (Signed)
Spoke with pt concerning most recent labs tumor marker improving no new orders per provider pt encouraged to monitor Bp at home and drink plenty of fluids encouraged to call for any questions concerns or changes

## 2020-09-23 NOTE — Telephone Encounter (Signed)
-----   Message from [name], MD sent at 09/20/2020  7:52 PM EST ----- Please let pt know her tumor marker is getting better which is good news. Also check if she is monitoring her BP at home and if she is drinking fluids adequately after treatment last week. Thanks   [name]  09/20/2020

## 2020-09-24 ENCOUNTER — Other Ambulatory Visit: Payer: Self-pay | Admitting: Hematology

## 2020-09-24 MED FILL — POTASSIUM CHLORIDE CRYS ER: 20 | 30 days supply | Qty: 30 | Fill #0

## 2020-10-06 MED FILL — LOSARTAN POTASSIUM 50 MG TA: 50 | 30 days supply | Qty: 60 | Fill #3

## 2020-10-06 MED FILL — OMEPRAZOLE 40 MG CPDR: 40 | 30 days supply | Qty: 60 | Fill #2

## 2020-10-06 MED FILL — GABAPENTIN 300 MG CAPSULE: 300 | 30 days supply | Qty: 270 | Fill #2

## 2020-10-08 NOTE — Progress Notes (Signed)
[place]   [phone] [phone]) [phone]   Clinic Follow up Note   Patient Care Team: [name], DO as PCP - General (Family Medicine) [name], MD as Consulting Physician (Gastroenterology) [name], MD as Consulting Physician (Hematology)  Date of Service:  10/10/2020  CHIEF COMPLAINT: F/u for metastatic breast cancer  SUMMARY OF ONCOLOGIC HISTORY: Oncology History Overview Note  Cancer Staging Metastatic breast cancer [facility]) Staging form: Breast, AJCC 8th Edition - Clinical stage from 03/24/2018: Stage IV (cT2, cN1, pM1, G3, ER+, PR+, HER2+) - Signed by [name], MD on 03/30/2018     Metastatic breast cancer ([place])  01/30/2018 Procedure   Colonoscopy showed small polyp in the sigmoid colon, removed, the exam of colon including the terminal ileum was otherwise negative.   01/30/2018 Procedure   EGD by Dr. Collene Mares showed small hiatal hernia, a 8 mm polypoid lesion in the cardia, biopsied.   03/09/2018 Imaging   03/09/2018 US Abdomen IMPRESSION: 1. Mass lesions throughout the liver, consistent with metastatic disease. Liver as a somewhat nodular contour suggesting underlying hepatic cirrhosis. Inhomogeneous echotexture to the liver.  2. Cholelithiasis with mild gallbladder wall thickening. A degree of cholecystitis cannot be excluded by ultrasound.  3. Portions of pancreas obscured by gas. Visualized portions of pancreas appear normal.  4. Small right kidney. Etiology uncertain. This finding potentially may be indicative of renal artery stenosis. In this regard, question whether patient is hypertensive.   03/15/2018 Imaging   CT CAP with contrast  IMPRESSION: 1. Widespread hepatic metastasis. 2. 2.6 cm lateral right breast soft tissue nodule could represent a breast primary or an incidental benign lesion. Consider correlation with mammogram and ultrasound. 3. No definite source of primary malignancy identified within the abdomen or  pelvis. There is possible rectosigmoid junction wall thickening. Consider colonoscopy with attention to this area. 4. Distal esophageal wall thickening, suggesting esophagitis.   03/24/2018 Cancer Staging   Staging form: Breast, AJCC 8th Edition - Clinical stage from 03/24/2018: Stage IV (cT2, cN1, pM1, G3, ER+, PR+, HER2+) - Signed by [name], MD on 03/30/2018   03/24/2018 Initial Biopsy   Diagnosis 1. 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 Breast, right, needle core biopsy, 9 o'clock, 7cm from nipple - INVASIVE DUCTAL CARCINOMA. -The carcinoma is somewhat morphologically dissimilar from that in part 1. It appears grade III 3. Lymph node, needle/core biopsy, right axillary - METASTATIC CARCINOMA IN 1 OF 1 LYMPH NODE (1/1).   03/24/2018 Receptors her2   Breast biopsy: 1. Estrogen Receptor: 40%, POSITIVE, STRONG-MODERATE STAINING INTENSITY Progesterone Receptor: 70%, POSITIVE, STRONG STAINING INTENSITY Proliferation Marker Ki67: 20% HER 2 equivocal by IHC 2+, POSITIVE by FISH, ratio 2.4 and copy #4.2  2. Estrogen Receptor: 60%, POSITIVE, MODERATE STAINING INTENSITY Progesterone Receptor: 40%, POSITIVE, MODERATE STAINING INTENSITY Proliferation Marker Ki67: 20% HER2 (+) by IHC 3+   03/24/2018 Initial Diagnosis   Metastatic breast cancer ([place])   03/27/2018 Pathology Results   Diagnosis Liver, needle/core biopsy, Right - METASTATIC CARCINOMA TO LIVER, CONSISTENT WITH PATIENTS CLINICAL HISTORY OF PRIMARY BREAST CARCINOMA.  ER 80%+ PR40%+ HER2- (by [facility], IHC 2+)  Ki67 50%    03/28/2018 Pathology Results   03/28/2018 Surgical Pathology Diagnosis 1. Breast, left, needle core biopsy, 9 o'clock - FIBROCYSTIC CHANGES. - THERE IS NO EVIDENCE OF MALIGNANCY. 2. Breast, left, needle core biopsy, 2 o'clock - FIBROADENOMA. - THERE IS NO EVIDENCE OF  MALIGNANCY. - SEE COMMENT.   03/29/2018 Imaging    03/29/2018 Bone Scan IMPRESSION: No scintigraphic evidence of osseous metastatic disease.   03/30/2018 Imaging   Bone scan  IMPRESSION: No scintigraphic evidence of osseous metastatic disease.    04/07/2018 - 07/30/2020 Chemotherapy   First line chemo weekly Taxol and herceptin/Perjeta every 3 weeks starting 04/07/18. She developed infusion reaction to taxol and it was discontinued. Added Abraxane on C1D8, 2 weeks on/1 week off.  Abraxne stopped after 10/13/18 due to worsening Neuropathy. She has continued with maintenance Herceptin injection/Perjeta q3weeks. Herceptin changed to injection on 04/06/19. Both injections switched to combination [place] on 10/01/19. ----Stopped 07/30/20 due to disease progression in liver.    06/06/2018 Imaging   CT CAP IMPRESSION: 1. Generally improved appearance, with reduced axillary adenopathy and reduced enhancing component of the hepatic masses, with some of the hepatic mass is moderately smaller than on the prior exam. Reduced size of the right lateral breast mass compared to the prior 03/15/2018 exam. 2. New mild interstitial accentuation in the lungs, significance uncertain. Part of this appearance may be due to lower lung volumes on today's exam. 3. Mild wall thickening in the descending colon and upper rectum suggesting low-grade colitis/inflammation. Prominent stool throughout the colon favors constipation. 4. Other imaging findings of potential clinical significance: Aortic Atherosclerosis (ICD10-I70.0). Mild cardiomegaly. Mild nodularity in the right lower lobe appears stable. Contracted and thick-walled gallbladder.    09/18/2018 Imaging   CT CAP W Contrast 09/18/18  IMPRESSION: 1. Liver metastases have decreased in size. 2. Right breast mass, mild right axillary lymphadenopathy and scattered tiny right pulmonary nodules are all stable. 3. New mild left supraclavicular and left subpectoral lymphadenopathy, can not exclude progression of metastatic  nodal disease. 4. Moderate colorectal stool volume, which may indicate constipation. 5.  Aortic Atherosclerosis (ICD10-I70.0).   10/2018 - 07/30/2020 Anti-estrogen oral therapy   Letrozole 2.5 mg daily starting 10/2018 D/c to proceed with Inhertu treatment after liver met progression.    01/10/2019 Imaging   CT CAP W Contrast 01/10/19  IMPRESSION: 1. Continued improvement in the hepatic metastatic lesions which have reduced in size. 2. Stable mild left supraclavicular and subpectoral adenopathy. 3. Essentially stable small right lower lobe pulmonary nodule and separate small subpleural nodule along the right hemidiaphragm. Surveillance suggested. 4. Other imaging findings of potential clinical significance: Mild cardiomegaly. Mild circumferential distal esophageal wall thickening, the most common cause would be esophagitis. Airway thickening is present, suggesting bronchitis or reactive airways disease. Airway plugging in the lower lobes and in the right middle lobe. Stable small right adrenal adenoma.   05/15/2019 Imaging   CT CAP W Contrast 05/15/19  IMPRESSION: CT CHEST IMPRESSION   1. Similar appearance of borderline supraclavicular, axillary, and subpectoral adenopathy. 2. Improved and resolved right lower lobe pulmonary nodularity. 3. Esophageal air fluid level suggests dysmotility or gastroesophageal reflux.   CT ABDOMEN AND PELVIS IMPRESSION   1. Improved hepatic metastasis. 2. Small bowel mesenteric lymph nodes which are upper normal and mildly enlarged. Likely increased and similar as detailed above. Indeterminate. Recommend attention on follow-up. 3. Cholelithiasis. 4. Motion degradation throughout the lower chest and abdomen.   09/19/2019 Imaging   CT CAP W contrast  IMPRESSION: 1. Interval decrease in size of right axillary and subpectoral lymph nodes. There are no pathologically enlarged lymph nodes remaining in the chest, abdomen, or pelvis. No new  lymphadenopathy. 2. No significant change in post treatment appearance of multiple low-attenuation liver lesions, in keeping with treated metastases.  3. No evidence of new metastatic disease in the chest, abdomen, or pelvis. 4. Aortic Atherosclerosis (ICD10-I70.0).   11/16/2019 Imaging   IMRI Brain  MPRESSION: Minimal chronic microvascular ischemic changes. No acute intracranial process.   Active bilateral maxillary sinus disease with mild frontoethmoid mucosal thickening.   01/23/2020 Imaging   CT CAP W contrast  IMPRESSION: 1. Stable exam. No new or progressive interval findings. 2. Stable appearance of upper normal right axillary lymph nodes. Tiny subpectoral and left axillary nodes are unchanged. 3. Generally similar appearance of ill-defined hypoattenuating lesions in the liver compatible with treated metastases. 1 lesion in the central liver is minimally more conspicuous today, likely related to bolus timing and attention on follow-up recommended. No new suspicious liver lesion on today's study. 4. Stable 10 mm right adrenal nodule., indeterminate. Continued attention on follow-up imaging recommended. 5. Cholelithiasis. 6. Aortic Atherosclerosis (ICD10-I70.0).   02/11/2020 Breast MRI   IMPRESSION: 1. 7 millimeter focus of residual enhancement associated with the known malignancy in the 9 o'clock location of the RIGHT breast. 2. No significant enhancement in the known malignancy in the 11:30 o'clock location of the RIGHT breast. 3. Interval resolution of axillary adenopathy.   07/28/2020 Imaging   CT CAP  IMPRESSION: 1. Interval progression of hepatic metastases. 2. Stable indeterminate right adrenal nodule. 3. No new sites of metastatic disease in the chest, abdomen or pelvis. 4. Chronic findings include: Small hiatal hernia. Cholelithiasis. Aortic Atherosclerosis (ICD10-I70.0).     08/08/2020 -  Chemotherapy   Second-line Inhertu q3weeks starting 08/08/20        CURRENT THERAPY:  Second-lineEnhertu q3weeks starting1/21/22. Due to poor toleration with C1, she required dose reduction from C2.  INTERVAL HISTORY:  ANTONINETTE LERNER is here for a follow up. She presents to the clinic alone. She notes she is doing well. She notes her last cycle treatment went well and better than the first. She denies increased nausea or diarrhea. She notes her breathing is adequate. She notes how should she take her BP medication when her BP is lower than 100. She notes her neuropathy is overall stable and manageable. I reviewed her medication list with her. She notes hair loss from Enhertu and decided to shave her hair off.     REVIEW OF SYSTEMS:   Constitutional: Denies fevers, chills or abnormal weight loss Eyes: Denies blurriness of vision Ears, nose, mouth, throat, and face: Denies mucositis or sore throat Respiratory: Denies cough, dyspnea or wheezes Cardiovascular: Denies palpitation, chest discomfort or lower extremity swelling Gastrointestinal:  Denies nausea, heartburn or change in bowel habits Skin: Denies abnormal skin rashes (+) Hair loss  Lymphatics: Denies new lymphadenopathy or easy bruising Neurological:Denies numbness, tingling or new weaknesses Behavioral/Psych: Mood is stable, no new changes  All other systems were reviewed with the patient and are negative.  MEDICAL HISTORY:  Past Medical History:  Diagnosis Date  . GERD (gastroesophageal reflux disease)   . Hypertension   . Personal history of chemotherapy   . rt breast ca with mets to liver dx'd 03/2018    SURGICAL HISTORY: Past Surgical History:  Procedure Laterality Date  . BREAST BIOPSY Right 03/24/2018   CA x3  . BREAST BIOPSY Left 03/28/2018   neg  . BREAST BIOPSY Left 03/30/2018   neg  . COLONOSCOPY    . ESOPHAGOGASTRODUODENOSCOPY ENDOSCOPY    . IR IMAGING GUIDED PORT INSERTION  04/04/2018    I have reviewed the social history and family history with the patient  and they  are unchanged from previous note.  ALLERGIES:  has No Known Allergies.  MEDICATIONS:  Current Outpatient Medications  Medication Sig Dispense Refill  . amLODipine (NORVASC) 5 MG tablet TAKE 1 TABLET BY MOUTH DAILY. 30 tablet 2  . Budeson-Glycopyrrol-Formoterol (BREZTRI AEROSPHERE) 160-9-4.8 MCG/ACT AERO Inhale 2 puffs into the lungs in the morning and at bedtime. 10.7 g 6  . carvedilol (COREG) 3.125 MG tablet TAKE 1 TABLET BY MOUTH TWICE DAILY. REPLACES ATENOLOL 180 tablet 3  . diphenoxylate-atropine (LOMOTIL) 2.5-0.025 MG tablet Take 1-2 tablets by mouth 4 (four) times daily as needed for diarrhea or loose stools. 60 tablet 1  . DULoxetine (CYMBALTA) 20 MG capsule TAKE 2 CAPSULES BY MOUTH DAILY 60 capsule 3  . gabapentin (NEURONTIN) 300 MG capsule May take up to 3 capsules three times daily 270 capsule 3  . lidocaine (XYLOCAINE) 2 % jelly Apply 1 application topically as needed.    Marland Kitchen losartan (COZAAR) 50 MG tablet Take 2 tablets (100 mg total) by mouth daily. 90 tablet 3  . meclizine (ANTIVERT) 12.5 MG tablet Take 1 tablet (12.5 mg total) by mouth 3 (three) times daily as needed for dizziness. 30 tablet 2  . montelukast (SINGULAIR) 10 MG tablet TAKE 1 TABLET (10 MG TOTAL) BY MOUTH AT BEDTIME. 30 tablet 5  . omeprazole (PRILOSEC) 40 MG capsule TAKE 1 CAPSULE BY MOUTH TWICE DAILY 60 capsule 3  . ondansetron (ZOFRAN) 8 MG tablet Take 1 tablet (8 mg total) by mouth every 8 (eight) hours as needed for nausea or vomiting. 30 tablet 3  . potassium chloride SA (KLOR-CON) 20 MEQ tablet TAKE 1 TABLET BY MOUTH ONCE DAILY 30 tablet 3  . prochlorperazine (COMPAZINE) 10 MG tablet Take 1 tablet (10 mg total) by mouth every 6 (six) hours as needed for nausea or vomiting. 30 tablet 3  . rivaroxaban (XARELTO) 20 MG TABS tablet TAKE 1 TABLET BY MOUTH DAILY WITH SUPPER 30 tablet 5  . spironolactone (ALDACTONE) 25 MG tablet TAKE 1 TABLET BY MOUTH ONCE DAILY 90 tablet 3  . traMADol (ULTRAM) 50 MG tablet  Take 1 tablet (50 mg total) by mouth every 6 (six) hours as needed for moderate pain or severe pain. 30 tablet 0   No current facility-administered medications for this visit.   Facility-Administered Medications Ordered in Other Visits  Medication Dose Route Frequency Provider Last Rate Last Admin  . sodium chloride flush (NS) 0.9 % injection 10 mL  10 mL Intracatheter PRN [name], MD   10 mL at 05/28/20 0935    PHYSICAL EXAMINATION: ECOG PERFORMANCE STATUS: 1 - Symptomatic but completely ambulatory  Vitals:   10/10/20 0826  BP: 129/69  Pulse: 71  Resp: 16  Temp: 97.6 F (36.4 C)  SpO2: 99%   Filed Weights   10/10/20 0826  Weight: 148 lb 6.4 oz (67.3 kg)    GENERAL:alert, no distress and comfortable SKIN: skin color, texture, turgor are normal, no rashes or significant lesions EYES: normal, Conjunctiva are pink and non-injected, sclera clear  NECK: supple, thyroid normal size, non-tender, without nodularity LYMPH:  no palpable lymphadenopathy in the cervical, axillary  LUNGS: clear to auscultation and percussion with normal breathing effort HEART: regular rate & rhythm and no murmurs and no lower extremity edema ABDOMEN:abdomen soft, non-tender and normal bowel sounds Musculoskeletal:no cyanosis of digits and no clubbing  NEURO: alert & oriented x 3 with fluent speech, no focal motor/sensory deficits BREAST: (+) Right breast mass not palpable. Left Breast exam benign.  LABORATORY  DATA:  I have reviewed the data as listed CBC Latest Ref Rng & Units 10/10/2020 09/19/2020 09/05/2020  WBC 4.0 - 10.5 K/uL 5.6 5.7 9.3  Hemoglobin 12.0 - 15.0 g/dL 8.9(L) 9.4(L) 10.2(L)  Hematocrit 36.0 - 46.0 % 27.9(L) 29.0(L) 30.9(L)  Platelets 150 - 400 K/uL 208 246 325     CMP Latest Ref Rng & Units 10/10/2020 09/19/2020 09/05/2020  Glucose 70 - 99 mg/dL 90 115(H) 97  BUN 8 - 23 mg/dL _0 Creatinine 0.44 - 1.00 mg/dL 1.19(H) 1.25(H) 1.53(H)  Sodium 135 - 145 mmol/L 137 135 131(L)   Potassium 3.5 - 5.1 mmol/L 4.3 4.3 4.5  Chloride 98 - 111 mmol/L 104 103 101  CO2 22 - 32 mmol/L _1 Calcium 8.9 - 10.3 mg/dL 9.6 9.3 9.1  Total Protein 6.5 - 8.1 g/dL 6.3(L) 6.4(L) 7.0  Total Bilirubin 0.3 - 1.2 mg/dL 0.3 0.2 0.3  Alkaline Phos 38 - 126 U/L 116 117 145(H)  AST 15 - 41 U/L _2 ALT 0 - 44 U/L _3 RADIOGRAPHIC STUDIES: I have personally reviewed the radiological images as listed and agreed with the findings in the report. No results found.   ASSESSMENT & PLAN:  Teresa Hays is a 65 y.o. female with    1. 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 Liver met was HER2-.  -Given the metastatic disease, her cancer is not curable but still treatable. -She had been treated withfirst linemaintenancetreatment with letrozole,trastuzumab and Perjeta, until recent disease progression on 07/28/20 CT CAP.  -I started her on second-lineEnhertuq3weeks starting 08/08/20.Given poor toleration with C1 (nausea, diarrhea, poor appetite), dose reduced with C2. Plan to scan after 3-4 months of treatment.  -She is tolerating dose reduction much better. Labs reviewed, Hg 8.9, she is more anemic. Overall adequate to proceed with C4 Enhertu today.  -Her breast exam showed no palpable breast masses. Will repeat every few months. Her CA 27.29 has significantly reduced on Enhertu (decreased by half). This is indicating good clinical response to treatment. Plan to scan her in 6 weeks.  Her tumor marker CA 27.29 has dropped significantly, she is likely responding to treatment. -F/u in 3 weeks.   2.Peripheral neuropathy, grade 2, Right Foot  pain,Secondarytochemotherapy -Ipreviouslystropped Abraxane on 10/13/18. -S/p PT and steroid injection her right foot pain is controlled. She still has mild tingling neuropathy in her toes an fingers. She has adequate function and denies falls. -She will also continue OTC Calcium and Vit D,OralB12, Gabapentin 39m3 tabsTID andCymbalta to 480m Tramadol every other night for right foot pain.Well controlled. -stable  3.Left UEDVT(08/04/18), OnXarelto 201mndefinitely  4. Anemia, Secondary to chemo -Continue monitoring, will consider blood transfusion if hemoglobin less than 8. -On Enhertu, her anemia has worsened. Hg at 8.9 today (10/10/20). Will continue to monitor. Will check ferritin and methylmalonic level on next visit    5. Goal of Care Discussion, Social support,  -She lives alone, [place] away  -On Ativan PRN due to anxietyabout diagnosis and treatment -The patient understands the goal of care is palliative. -She is full code  6.Asthma -She has been seen by Dr.  Icard who notes this is related to her post nasal drip and her Acid reflux can contribute. -She is onPrilosec2 tabs BID,allergymedicationand nasal spray and inhalers.Her insurance denied Dexilant.  -Her breathing has improved and adequate on new inhaler. Continue tof/u with Dr Valeta Harms and Dr Collene Mares.    7. Syncope, HTN  -During week 1 of C2 Enhertu she notes episode of syncope.  -She is on Amlodipine and losartan and Coreg. If her systolic BP is below 480, she can hold amlodipine and losartan until in better range. I encouraged her to monitor her BP at home.  -I encouraged her to drink more fluids, especially tonic water    Plan: -Labs reviewed and adequate to proceed with C4 Enhertu today at same dose  -Lab, flush, F/u and Enhertu in 3 and 6 weeks -BP will monitor BP at home and hold BP meds if SBP<100   -CT CAP W  contrast in 5-6 weeks    No problem-specific Assessment & Plan notes found for this encounter.   Orders Placed This Encounter  Procedures  . CT CHEST ABDOMEN PELVIS W CONTRAST    Standing Status:   Future    Standing Expiration Date:   10/10/2021    Order Specific Question:   If indicated for the ordered procedure, I authorize the administration of contrast media per Radiology protocol    Answer:   Yes    Order Specific Question:   Preferred imaging location?    Answer:   [facility]    Order Specific Question:   Release to patient    Answer:   Immediate    Order Specific Question:   Is Oral Contrast requested for this exam?    Answer:   Yes, Per Radiology protocol    Order Specific Question:   Reason for Exam (SYMPTOM  OR DIAGNOSIS REQUIRED)    Answer:   evaluate response to chemo  . Ferritin    Standing Status:   Future    Standing Expiration Date:   10/10/2021  . Methylmalonic acid, serum    Standing Status:   Future    Standing Expiration Date:   10/10/2021   All questions were answered. The patient knows to call the clinic with any problems, questions or concerns. No barriers to learning was detected. The total time spent in the appointment was 30 minutes.     [name], MD 10/10/2020   I, Joslyn Devon, am acting as scribe for [name], MD.   I have reviewed the above documentation for accuracy and completeness, and I agree with the above.

## 2020-10-10 ENCOUNTER — Other Ambulatory Visit (HOSPITAL_BASED_OUTPATIENT_CLINIC_OR_DEPARTMENT_OTHER): Payer: Self-pay

## 2020-10-10 ENCOUNTER — Encounter: Payer: Self-pay | Admitting: Hematology

## 2020-10-10 ENCOUNTER — Inpatient Hospital Stay: Payer: Medicare Other

## 2020-10-10 ENCOUNTER — Inpatient Hospital Stay (HOSPITAL_BASED_OUTPATIENT_CLINIC_OR_DEPARTMENT_OTHER): Payer: Medicare Other | Admitting: Hematology

## 2020-10-10 ENCOUNTER — Other Ambulatory Visit: Payer: Self-pay

## 2020-10-10 VITALS — BP 129/69 | HR 71 | Temp 97.6°F | Resp 16 | Ht 64.0 in | Wt 148.4 lb

## 2020-10-10 DIAGNOSIS — Z7189 Other specified counseling: Secondary | ICD-10-CM

## 2020-10-10 DIAGNOSIS — C50919 Malignant neoplasm of unspecified site of unspecified female breast: Secondary | ICD-10-CM

## 2020-10-10 DIAGNOSIS — D649 Anemia, unspecified: Secondary | ICD-10-CM

## 2020-10-10 DIAGNOSIS — Z95828 Presence of other vascular implants and grafts: Secondary | ICD-10-CM

## 2020-10-10 DIAGNOSIS — Z5112 Encounter for antineoplastic immunotherapy: Secondary | ICD-10-CM | POA: Diagnosis not present

## 2020-10-10 LAB — CBC WITH DIFFERENTIAL (CANCER CENTER ONLY)
Abs Immature Granulocytes: 0.01 10*3/uL (ref 0.00–0.07)
Basophils Absolute: 0 10*3/uL (ref 0.0–0.1)
Basophils Relative: 1 %
Eosinophils Absolute: 0.2 10*3/uL (ref 0.0–0.5)
Eosinophils Relative: 4 %
HCT: 27.9 % — ABNORMAL LOW (ref 36.0–46.0)
Hemoglobin: 8.9 g/dL — ABNORMAL LOW (ref 12.0–15.0)
Immature Granulocytes: 0 %
Lymphocytes Relative: 31 %
Lymphs Abs: 1.7 10*3/uL (ref 0.7–4.0)
MCH: 28.7 pg (ref 26.0–34.0)
MCHC: 31.9 g/dL (ref 30.0–36.0)
MCV: 90 fL (ref 80.0–100.0)
Monocytes Absolute: 0.3 10*3/uL (ref 0.1–1.0)
Monocytes Relative: 6 %
Neutro Abs: 3.3 10*3/uL (ref 1.7–7.7)
Neutrophils Relative %: 58 %
Platelet Count: 208 10*3/uL (ref 150–400)
RBC: 3.1 MIL/uL — ABNORMAL LOW (ref 3.87–5.11)
RDW: 16.3 % — ABNORMAL HIGH (ref 11.5–15.5)
WBC Count: 5.6 10*3/uL (ref 4.0–10.5)
nRBC: 0 % (ref 0.0–0.2)

## 2020-10-10 LAB — CMP (CANCER CENTER ONLY)
ALT: 18 U/L (ref 0–44)
AST: 25 U/L (ref 15–41)
Albumin: 3.3 g/dL — ABNORMAL LOW (ref 3.5–5.0)
Alkaline Phosphatase: 116 U/L (ref 38–126)
Anion gap: 8 (ref 5–15)
BUN: 15 mg/dL (ref 8–23)
CO2: 25 mmol/L (ref 22–32)
Calcium: 9.6 mg/dL (ref 8.9–10.3)
Chloride: 104 mmol/L (ref 98–111)
Creatinine: 1.19 mg/dL — ABNORMAL HIGH (ref 0.44–1.00)
GFR, Estimated: 51 mL/min — ABNORMAL LOW (ref >60)
Glucose, Bld: 90 mg/dL (ref 70–99)
Potassium: 4.3 mmol/L (ref 3.5–5.1)
Sodium: 137 mmol/L (ref 135–145)
Total Bilirubin: 0.3 mg/dL (ref 0.3–1.2)
Total Protein: 6.3 g/dL — ABNORMAL LOW (ref 6.5–8.1)

## 2020-10-10 MED ORDER — FAM-TRASTUZUMAB DERUXTECAN-NXKI CHEMO 100 MG IV SOLR
4.5000 mg/kg | Freq: Once | INTRAVENOUS | Status: AC
  Administered 2020-10-10: 300 mg via INTRAVENOUS
  Filled 2020-10-10: qty 15

## 2020-10-10 MED ORDER — DEXAMETHASONE SODIUM PHOSPHATE 100 MG/10ML IJ SOLN
10.0000 mg | Freq: Once | INTRAMUSCULAR | Status: AC
  Administered 2020-10-10: 10 mg via INTRAVENOUS
  Filled 2020-10-10: qty 10

## 2020-10-10 MED ORDER — ACETAMINOPHEN 325 MG PO TABS
650.0000 mg | ORAL_TABLET | Freq: Once | ORAL | Status: AC
  Administered 2020-10-10: 650 mg via ORAL

## 2020-10-10 MED ORDER — PALONOSETRON HCL INJECTION 0.25 MG/5ML
INTRAVENOUS | Status: AC
  Filled 2020-10-10: qty 5

## 2020-10-10 MED ORDER — PALONOSETRON HCL INJECTION 0.25 MG/5ML
0.2500 mg | Freq: Once | INTRAVENOUS | Status: AC
  Administered 2020-10-10: 0.25 mg via INTRAVENOUS

## 2020-10-10 MED ORDER — DEXTROSE 5 % IV SOLN
Freq: Once | INTRAVENOUS | Status: AC
  Filled 2020-10-10: qty 250

## 2020-10-10 MED ORDER — DIPHENHYDRAMINE HCL 25 MG PO CAPS
50.0000 mg | ORAL_CAPSULE | Freq: Once | ORAL | Status: AC
  Administered 2020-10-10: 50 mg via ORAL

## 2020-10-10 MED ORDER — SODIUM CHLORIDE 0.9% FLUSH
10.0000 mL | INTRAVENOUS | Status: DC | PRN
  Administered 2020-10-10: 10 mL
  Filled 2020-10-10: qty 10

## 2020-10-10 MED ORDER — HEPARIN SOD (PORK) LOCK FLUSH 100 UNIT/ML IV SOLN
500.0000 [IU] | Freq: Once | INTRAVENOUS | Status: AC | PRN
  Administered 2020-10-10: 500 [IU]
  Filled 2020-10-10: qty 5

## 2020-10-10 MED ORDER — ACETAMINOPHEN 325 MG PO TABS
ORAL_TABLET | ORAL | Status: AC
  Filled 2020-10-10: qty 2

## 2020-10-10 MED ORDER — DIPHENHYDRAMINE HCL 25 MG PO CAPS
ORAL_CAPSULE | ORAL | Status: AC
  Filled 2020-10-10: qty 2

## 2020-10-10 NOTE — Patient Instructions (Signed)
[facility] Discharge Instructions for Patients Receiving Chemotherapy  Today you received the following chemotherapy agents: Enhertu  To help prevent nausea and vomiting after your treatment, we encourage you to take your nausea medication as directed.   If you develop nausea and vomiting that is not controlled by your nausea medication, call the clinic.   BELOW ARE SYMPTOMS THAT SHOULD BE REPORTED IMMEDIATELY:  *FEVER GREATER THAN 100.5 F  *CHILLS WITH OR WITHOUT FEVER  NAUSEA AND VOMITING THAT IS NOT CONTROLLED WITH YOUR NAUSEA MEDICATION  *UNUSUAL SHORTNESS OF BREATH  *UNUSUAL BRUISING OR BLEEDING  TENDERNESS IN MOUTH AND THROAT WITH OR WITHOUT PRESENCE OF ULCERS  *URINARY PROBLEMS  *BOWEL PROBLEMS  UNUSUAL RASH Items with * indicate a potential emergency and should be followed up as soon as possible.  Feel free to call the clinic should you have any questions or concerns. The clinic phone number is [phone].  Please show the CHEMO ALERT CARD at check-in to the Emergency Department and triage nurse.   

## 2020-10-11 MED FILL — DULOXETINE HCL 20 MG CPEP: 20 | 30 days supply | Qty: 60 | Fill #2

## 2020-10-11 MED FILL — XARELTO 20 MG TABLET: 20 | 30 days supply | Qty: 30 | Fill #5

## 2020-10-14 ENCOUNTER — Telehealth: Payer: Self-pay | Admitting: Hematology

## 2020-10-14 NOTE — Telephone Encounter (Signed)
Scheduled follow-up appointment per 3/25 los. Patient is aware.

## 2020-10-15 NOTE — Telephone Encounter (Signed)
Paper stated that the medication had been approved.  Nothing further is needed.

## 2020-10-19 ENCOUNTER — Other Ambulatory Visit: Payer: Self-pay | Admitting: Family Medicine

## 2020-10-19 DIAGNOSIS — I10 Essential (primary) hypertension: Secondary | ICD-10-CM

## 2020-10-19 MED FILL — Potassium Chloride Microencapsulated Crys ER Tab 20 mEq: ORAL | 30 days supply | Qty: 30 | Fill #0 | Status: AC

## 2020-10-19 MED FILL — Montelukast Sodium Tab 10 MG (Base Equiv): ORAL | 30 days supply | Qty: 30 | Fill #0 | Status: AC

## 2020-10-20 ENCOUNTER — Other Ambulatory Visit (HOSPITAL_COMMUNITY): Payer: Self-pay

## 2020-10-20 MED ORDER — AMLODIPINE BESYLATE 5 MG PO TABS
ORAL_TABLET | Freq: Every day | ORAL | 2 refills | Status: DC
  Filled 2020-10-20: qty 30, 30d supply, fill #0
  Filled 2020-11-22: qty 30, 30d supply, fill #1
  Filled 2020-11-24 – 2020-12-20 (×2): qty 30, 30d supply, fill #2

## 2020-10-22 ENCOUNTER — Other Ambulatory Visit (HOSPITAL_COMMUNITY): Payer: Self-pay

## 2020-10-23 ENCOUNTER — Other Ambulatory Visit (HOSPITAL_COMMUNITY): Payer: Self-pay

## 2020-10-24 ENCOUNTER — Other Ambulatory Visit (HOSPITAL_COMMUNITY): Payer: Self-pay

## 2020-10-27 NOTE — Progress Notes (Signed)
[place]   [phone] [phone]   Clinic Follow up Note   Patient Care Team: [name], DO as PCP - General (Family Medicine) [name], MD as Consulting Physician (Gastroenterology) [name], MD as Consulting Physician (Hematology)  Date of Service:  10/31/2020  CHIEF COMPLAINT: F/u for metastatic breast cancer  SUMMARY OF ONCOLOGIC HISTORY: Oncology History Overview Note  Cancer Staging Metastatic breast cancer [facility]) Staging form: Breast, AJCC 8th Edition - Clinical stage from 03/24/2018: Stage IV (cT2, cN1, pM1, G3, ER+, PR+, HER2+) - Signed by [name], MD on 03/30/2018     Metastatic breast cancer ([place])  01/30/2018 Procedure   Colonoscopy showed small polyp in the sigmoid colon, removed, the exam of colon including the terminal ileum was otherwise negative.   01/30/2018 Procedure   EGD by Dr. Collene Mares showed small hiatal hernia, a 8 mm polypoid lesion in the cardia, biopsied.   03/09/2018 Imaging   03/09/2018 US Abdomen IMPRESSION: 1. Mass lesions throughout the liver, consistent with metastatic disease. Liver as a somewhat nodular contour suggesting underlying hepatic cirrhosis. Inhomogeneous echotexture to the liver.  2. Cholelithiasis with mild gallbladder wall thickening. A degree of cholecystitis cannot be excluded by ultrasound.  3. Portions of pancreas obscured by gas. Visualized portions of pancreas appear normal.  4. Small right kidney. Etiology uncertain. This finding potentially may be indicative of renal artery stenosis. In this regard, question whether patient is hypertensive.   03/15/2018 Imaging   CT CAP with contrast  IMPRESSION: 1. Widespread hepatic metastasis. 2. 2.6 cm lateral right breast soft tissue nodule could represent a breast primary or an incidental benign lesion. Consider correlation with mammogram and ultrasound. 3. No definite source of primary malignancy identified within the abdomen or  pelvis. There is possible rectosigmoid junction wall thickening. Consider colonoscopy with attention to this area. 4. Distal esophageal wall thickening, suggesting esophagitis.   03/24/2018 Cancer Staging   Staging form: Breast, AJCC 8th Edition - Clinical stage from 03/24/2018: Stage IV (cT2, cN1, pM1, G3, ER+, PR+, HER2+) - Signed by [name], MD on 03/30/2018   03/24/2018 Initial Biopsy   Diagnosis 1. 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 Breast, right, needle core biopsy, 9 o'clock, 7cm from nipple - INVASIVE DUCTAL CARCINOMA. -The carcinoma is somewhat morphologically dissimilar from that in part 1. It appears grade III 3. Lymph node, needle/core biopsy, right axillary - METASTATIC CARCINOMA IN 1 OF 1 LYMPH NODE (1/1).   03/24/2018 Receptors her2   Breast biopsy: 1. Estrogen Receptor: 40%, POSITIVE, STRONG-MODERATE STAINING INTENSITY Progesterone Receptor: 70%, POSITIVE, STRONG STAINING INTENSITY Proliferation Marker Ki67: 20% HER 2 equivocal by IHC 2+, POSITIVE by FISH, ratio 2.4 and copy #4.2  2. Estrogen Receptor: 60%, POSITIVE, MODERATE STAINING INTENSITY Progesterone Receptor: 40%, POSITIVE, MODERATE STAINING INTENSITY Proliferation Marker Ki67: 20% HER2 (+) by IHC 3+   03/24/2018 Initial Diagnosis   Metastatic breast cancer ([place])   03/27/2018 Pathology Results   Diagnosis Liver, needle/core biopsy, Right - METASTATIC CARCINOMA TO LIVER, CONSISTENT WITH PATIENTS CLINICAL HISTORY OF PRIMARY BREAST CARCINOMA.  ER 80%+ PR40%+ HER2- (by [facility], IHC 2+)  Ki67 50%    03/28/2018 Pathology Results   03/28/2018 Surgical Pathology Diagnosis 1. Breast, left, needle core biopsy, 9 o'clock - FIBROCYSTIC CHANGES. - THERE IS NO EVIDENCE OF MALIGNANCY. 2. Breast, left, needle core biopsy, 2 o'clock - FIBROADENOMA. - THERE IS NO EVIDENCE OF  MALIGNANCY. - SEE COMMENT.   03/29/2018 Imaging    03/29/2018 Bone Scan IMPRESSION: No scintigraphic evidence of osseous metastatic disease.   03/30/2018 Imaging   Bone scan  IMPRESSION: No scintigraphic evidence of osseous metastatic disease.    04/07/2018 - 07/30/2020 Chemotherapy   First line chemo weekly Taxol and herceptin/Perjeta every 3 weeks starting 04/07/18. She developed infusion reaction to taxol and it was discontinued. Added Abraxane on C1D8, 2 weeks on/1 week off.  Abraxne stopped after 10/13/18 due to worsening Neuropathy. She has continued with maintenance Herceptin injection/Perjeta q3weeks. Herceptin changed to injection on 04/06/19. Both injections switched to combination [place] on 10/01/19. ----Stopped 07/30/20 due to disease progression in liver.    06/06/2018 Imaging   CT CAP IMPRESSION: 1. Generally improved appearance, with reduced axillary adenopathy and reduced enhancing component of the hepatic masses, with some of the hepatic mass is moderately smaller than on the prior exam. Reduced size of the right lateral breast mass compared to the prior 03/15/2018 exam. 2. New mild interstitial accentuation in the lungs, significance uncertain. Part of this appearance may be due to lower lung volumes on today's exam. 3. Mild wall thickening in the descending colon and upper rectum suggesting low-grade colitis/inflammation. Prominent stool throughout the colon favors constipation. 4. Other imaging findings of potential clinical significance: Aortic Atherosclerosis (ICD10-I70.0). Mild cardiomegaly. Mild nodularity in the right lower lobe appears stable. Contracted and thick-walled gallbladder.    09/18/2018 Imaging   CT CAP W Contrast 09/18/18  IMPRESSION: 1. Liver metastases have decreased in size. 2. Right breast mass, mild right axillary lymphadenopathy and scattered tiny right pulmonary nodules are all stable. 3. New mild left supraclavicular and left subpectoral lymphadenopathy, can not exclude progression of metastatic  nodal disease. 4. Moderate colorectal stool volume, which may indicate constipation. 5.  Aortic Atherosclerosis (ICD10-I70.0).   10/2018 - 07/30/2020 Anti-estrogen oral therapy   Letrozole 2.5 mg daily starting 10/2018 D/c to proceed with Inhertu treatment after liver met progression.    01/10/2019 Imaging   CT CAP W Contrast 01/10/19  IMPRESSION: 1. Continued improvement in the hepatic metastatic lesions which have reduced in size. 2. Stable mild left supraclavicular and subpectoral adenopathy. 3. Essentially stable small right lower lobe pulmonary nodule and separate small subpleural nodule along the right hemidiaphragm. Surveillance suggested. 4. Other imaging findings of potential clinical significance: Mild cardiomegaly. Mild circumferential distal esophageal wall thickening, the most common cause would be esophagitis. Airway thickening is present, suggesting bronchitis or reactive airways disease. Airway plugging in the lower lobes and in the right middle lobe. Stable small right adrenal adenoma.   05/15/2019 Imaging   CT CAP W Contrast 05/15/19  IMPRESSION: CT CHEST IMPRESSION   1. Similar appearance of borderline supraclavicular, axillary, and subpectoral adenopathy. 2. Improved and resolved right lower lobe pulmonary nodularity. 3. Esophageal air fluid level suggests dysmotility or gastroesophageal reflux.   CT ABDOMEN AND PELVIS IMPRESSION   1. Improved hepatic metastasis. 2. Small bowel mesenteric lymph nodes which are upper normal and mildly enlarged. Likely increased and similar as detailed above. Indeterminate. Recommend attention on follow-up. 3. Cholelithiasis. 4. Motion degradation throughout the lower chest and abdomen.   09/19/2019 Imaging   CT CAP W contrast  IMPRESSION: 1. Interval decrease in size of right axillary and subpectoral lymph nodes. There are no pathologically enlarged lymph nodes remaining in the chest, abdomen, or pelvis. No new  lymphadenopathy. 2. No significant change in post treatment appearance of multiple low-attenuation liver lesions, in keeping with treated metastases.  3. No evidence of new metastatic disease in the chest, abdomen, or pelvis. 4. Aortic Atherosclerosis (ICD10-I70.0).   11/16/2019 Imaging   IMRI Brain  MPRESSION: Minimal chronic microvascular ischemic changes. No acute intracranial process.   Active bilateral maxillary sinus disease with mild frontoethmoid mucosal thickening.   01/23/2020 Imaging   CT CAP W contrast  IMPRESSION: 1. Stable exam. No new or progressive interval findings. 2. Stable appearance of upper normal right axillary lymph nodes. Tiny subpectoral and left axillary nodes are unchanged. 3. Generally similar appearance of ill-defined hypoattenuating lesions in the liver compatible with treated metastases. 1 lesion in the central liver is minimally more conspicuous today, likely related to bolus timing and attention on follow-up recommended. No new suspicious liver lesion on today's study. 4. Stable 10 mm right adrenal nodule., indeterminate. Continued attention on follow-up imaging recommended. 5. Cholelithiasis. 6. Aortic Atherosclerosis (ICD10-I70.0).   02/11/2020 Breast MRI   IMPRESSION: 1. 7 millimeter focus of residual enhancement associated with the known malignancy in the 9 o'clock location of the RIGHT breast. 2. No significant enhancement in the known malignancy in the 11:30 o'clock location of the RIGHT breast. 3. Interval resolution of axillary adenopathy.   07/28/2020 Imaging   CT CAP  IMPRESSION: 1. Interval progression of hepatic metastases. 2. Stable indeterminate right adrenal nodule. 3. No new sites of metastatic disease in the chest, abdomen or pelvis. 4. Chronic findings include: Small hiatal hernia. Cholelithiasis. Aortic Atherosclerosis (ICD10-I70.0).     08/08/2020 -  Chemotherapy   Second-line Inhertu q3weeks starting 08/08/20        CURRENT THERAPY:  Second-lineEnhertu q3weeks starting1/21/22. Due to poor toleration with C1, she required dose reduction from C2.  INTERVAL HISTORY:  IDABELL PICKING is here for a follow up. She was last seen by me 10/10/20. She presents to the clinic with her daughter. She denies recent dizziness or syncope. She notes her trunk will jerk like for 1 minutes and then stop. She denies vision issues. She notes she is drinking enough. I reviewed her medication list with her. She notes her neuropathy has been stable. She notes her breathing has improved lately.    REVIEW OF SYSTEMS:   Constitutional: Denies fevers, chills or abnormal weight loss Eyes: Denies blurriness of vision Ears, nose, mouth, throat, and face: Denies mucositis or sore throat Respiratory: Denies cough, dyspnea or wheezes Cardiovascular: Denies palpitation, chest discomfort or lower extremity swelling Gastrointestinal:  Denies nausea, heartburn or change in bowel habits Skin: Denies abnormal skin rashes Lymphatics: Denies new lymphadenopathy or easy bruising Neurological: (+) Stable neuropathy  Behavioral/Psych: Mood is stable, no new changes  All other systems were reviewed with the patient and are negative.  MEDICAL HISTORY:  Past Medical History:  Diagnosis Date  . GERD (gastroesophageal reflux disease)   . Hypertension   . Personal history of chemotherapy   . rt breast ca with mets to liver dx'd 03/2018    SURGICAL HISTORY: Past Surgical History:  Procedure Laterality Date  . BREAST BIOPSY Right 03/24/2018   CA x3  . BREAST BIOPSY Left 03/28/2018   neg  . BREAST BIOPSY Left 03/30/2018   neg  . COLONOSCOPY    . ESOPHAGOGASTRODUODENOSCOPY ENDOSCOPY    . IR IMAGING GUIDED PORT INSERTION  04/04/2018    I have reviewed the social history and family history with the patient and they are unchanged from previous note.  ALLERGIES:  has No Known Allergies.  MEDICATIONS:  Current Outpatient  Medications  Medication Sig Dispense Refill  .  amLODipine (NORVASC) 5 MG tablet TAKE 1 TABLET BY MOUTH ONCE DAILY 30 tablet 2  . Budeson-Glycopyrrol-Formoterol 160-9-4.8 MCG/ACT AERO INHALE 2 PUFFS BY MOUTH INTO THE LUNGS IN THE MORNING AND AT BEDTIME. 10.7 g 6  . carvedilol (COREG) 3.125 MG tablet TAKE 1 TABLET BY MOUTH TWICE DAILY. REPLACES ATENOLOL 180 tablet 3  . DULoxetine (CYMBALTA) 20 MG capsule TAKE 2 CAPSULES BY MOUTH DAILY 60 capsule 3  . gabapentin (NEURONTIN) 300 MG capsule MAY TAKE UP TO 3 CAPSULES THREE TIMES DAILY BY MOUTH 270 capsule 3  . lidocaine (XYLOCAINE) 2 % jelly Apply 1 application topically as needed.    . lidocaine (XYLOCAINE) 2 % jelly APPLY OVER PORT A CATH TOPICALLY 1 HOUR BEFORE PORT BEING ACCESSED AS NEEDED 30 mL 2  . losartan (COZAAR) 50 MG tablet TAKE 2 TABLETS (100 MG) BY MOUTH ONCE DAILY 90 tablet 3  . meclizine (ANTIVERT) 12.5 MG tablet Take 1 tablet (12.5 mg total) by mouth 3 (three) times daily as needed for dizziness. 30 tablet 2  . meclizine (ANTIVERT) 25 MG tablet TAKE 1/2 TABLET BY MOUTH 3 TIMES DAILY AS NEEDED FOR DIZZINESS 15 tablet 2  . montelukast (SINGULAIR) 10 MG tablet TAKE 1 TABLET (10 MG TOTAL) BY MOUTH AT BEDTIME. 30 tablet 5  . omeprazole (PRILOSEC) 40 MG capsule TAKE 1 CAPSULE BY MOUTH 2 TIMES DAILY 60 capsule 3  . ondansetron (ZOFRAN) 8 MG tablet TAKE 1 TABLET BY MOUTH EVERY 8 HOURS AS NEEDED FOR NAUSEA OR VOMITING 30 tablet 3  . potassium chloride SA (KLOR-CON) 20 MEQ tablet TAKE 1 TABLET BY MOUTH ONCE A DAY 30 tablet 3  . prochlorperazine (COMPAZINE) 10 MG tablet TAKE 1 TABLET BY MOUTH EVERY 6 HOURS AS NEEDED FOR NAUSEA OR VOMITING 30 tablet 3  . rivaroxaban (XARELTO) 20 MG TABS tablet TAKE 1 TABLET BY MOUTH DAILY WITH SUPPER 30 tablet 5  . spironolactone (ALDACTONE) 25 MG tablet TAKE 1 TABLET BY MOUTH ONCE DAILY 90 tablet 3  . traMADol (ULTRAM) 50 MG tablet TAKE 1 TABLET (50 MG TOTAL) BY MOUTH EVERY 6 (SIX) HOURS AS NEEDED FOR MODERATE OR  SEVERE PAIN. 30 tablet 0  . Zoster Vaccine Adjuvanted (SHINGRIX) injection INJECT 0.5 MLS INTO THE MUSCLE ONCE FOR 1 DOSE. 1 each 1   No current facility-administered medications for this visit.   Facility-Administered Medications Ordered in Other Visits  Medication Dose Route Frequency Provider Last Rate Last Admin  . sodium chloride flush (NS) 0.9 % injection 10 mL  10 mL Intracatheter PRN [name], MD   10 mL at 05/28/20 0935    PHYSICAL EXAMINATION: ECOG PERFORMANCE STATUS: 1 - Symptomatic but completely ambulatory  Vitals:   10/31/20 0920  BP: 121/81  Pulse: 75  Resp: 18  Temp: (!) 97.2 F (36.2 C)  SpO2: 100%   Filed Weights   10/31/20 0920  Weight: 147 lb 3.2 oz (66.8 kg)    Due to COVID19 we will limit examination to appearance. Patient had no complaints.  GENERAL:alert, no distress and comfortable SKIN: skin color normal, no rashes or significant lesions EYES: normal, Conjunctiva are pink and non-injected, sclera clear  NEURO: alert & oriented x 3 with fluent speech   LABORATORY DATA:  I have reviewed the data as listed CBC Latest Ref Rng & Units 10/31/2020 10/10/2020 09/19/2020  WBC 4.0 - 10.5 K/uL 5.8 5.6 5.7  Hemoglobin 12.0 - 15.0 g/dL 9.4(L) 8.9(L) 9.4(L)  Hematocrit 36.0 - 46.0 % 29.9(L) 27.9(L) 29.0(L)  Platelets  150 - 400 K/uL 211 208 246     CMP Latest Ref Rng & Units 10/10/2020 09/19/2020 09/05/2020  Glucose 70 - 99 mg/dL 90 115(H) 97  BUN 8 - 23 mg/dL 15 13 16   Creatinine 0.44 - 1.00 mg/dL 1.19(H) 1.25(H) 1.53(H)  Sodium 135 - 145 mmol/L 137 135 131(L)  Potassium 3.5 - 5.1 mmol/L 4.3 4.3 4.5  Chloride 98 - 111 mmol/L 104 103 101  CO2 22 - 32 mmol/L 25 22 22   Calcium 8.9 - 10.3 mg/dL 9.6 9.3 9.1  Total Protein 6.5 - 8.1 g/dL 6.3(L) 6.4(L) 7.0  Total Bilirubin 0.3 - 1.2 mg/dL 0.3 0.2 0.3  Alkaline Phos 38 - 126 U/L 116 117 145(H)  AST 15 - 41 U/L 25 27 27   ALT 0 - 44 U/L 18 19 28       RADIOGRAPHIC STUDIES: I have personally reviewed the  radiological images as listed and agreed with the findings in the report. No results found.   ASSESSMENT & PLAN:  CHANIYAH JAHR is a 65 y.o. female with    1. 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 Liver met was HER2-.  -Given the metastatic disease, her cancer is not curable but still treatable. -She had been treated withfirst linemaintenancetreatment with letrozole,trastuzumab and Perjeta, until recent disease progression on 07/28/20 CT CAP.  -I started her on second-lineEnhertuq3weeks starting 08/08/20.Given poor toleration with C1 (nausea, diarrhea, poor appetite), dose reduced with C2 and she tolerated better.Plan to scan her in May.  -She continues to tolerate treatment well. Labs reviewed and stable. Overall adequate to proceed with C5 Enhertu today.  -Continue Enhertu every 3 weeks. -F/u in 3 weeks.   2.Peripheral neuropathy, grade 2, Right Foot pain,Secondarytochemotherapy -Ipreviouslystropped Abraxane on 10/13/18. -S/p PT and steroid injection her right foot pain is controlled. She still has mild tingling neuropathy in her toes an fingers. She has adequate function and denies falls. -She will also continue OTC Calcium and Vit D,OralB12, Gabapentin 300mg 3 tabsTID andCymbalta to 40mg , Tramadol every other night for right foot pain.Well controlled. 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 Anemia, Secondary to chemo -Continue monitoring, will consider blood transfusion if hemoglobin less than 8. -On Enhertu, her anemia has worsened and moderate. Will continue to monitor. Stable.   5.Goal of Care Discussion,Social support,  -She lives alone,  has her sister and niece in [place]. Her daughter lives in a few hours away  -On Ativan PRN due to anxietyabout diagnosis and treatment -The patient understands the goal of care is palliative. -She is full code  6.Asthma -She has been seen by Dr. Valeta Harms who notes this is related to her post nasal drip and her Acid reflux can contribute. -She is onPrilosec2 tabs BID,allergymedicationand nasal spray and inhalers.Her insurance denied Dexilant.  -Her breathing has improved and adequate on new inhaler. Continue tof/u with Dr Valeta Harms and Dr Collene Mares.   7. Syncope, HTN  -During week 1 of C2 Enhertu she notes episode of syncope.  -She is on Amlodipine and losartan and Coreg. If her systolic BP is below 572, she can hold amlodipine and losartan until in better range. I encouraged her to monitor her BP at home.  -She has remained more hydrated and has not had another syncopal episode.  -Patient  notes occasional upper body jerk recently. I discussed watching this closely at home and if continues to worsens will evaluate for seizure.    Plan: -Labs reviewed and adequate to proceed with C5Enhertu today at same dose -Lab, flush, F/u and Enhertu in 3 and 6 weeks.  -CT CAP W contrast a few days before OV in 3 weeks.    No problem-specific Assessment & Plan notes found for this encounter.   Orders Placed This Encounter  Procedures  . CT CHEST ABDOMEN PELVIS W CONTRAST    Standing Status:   Future    Standing Expiration Date:   10/31/2021    Order Specific Question:   If indicated for the ordered procedure, I authorize the administration of contrast media per Radiology protocol    Answer:   Yes    Order Specific Question:   Preferred imaging location?    Answer:   [facility]    Order Specific Question:   Release to patient    Answer:   Manual release only    Order Specific Question:   Reason for preventing immediate release    Answer:   Patient request [1]    Order  Specific Question:   Additional details for preventing immediate release    Answer:   pt's request    Order Specific Question:   Is Oral Contrast requested for this exam?    Answer:   Yes, Per Radiology protocol    Order Specific Question:   Reason for Exam (SYMPTOM  OR DIAGNOSIS REQUIRED)    Answer:   evaluate resposne to treatment   All questions were answered. The patient knows to call the clinic with any problems, questions or concerns. No barriers to learning was detected. The total time spent in the appointment was 30 minutes.     [name], MD 10/31/2020   I, Joslyn Devon, am acting as scribe for [name], MD.   I have reviewed the above documentation for accuracy and completeness, and I agree with the above.

## 2020-10-31 ENCOUNTER — Inpatient Hospital Stay: Payer: Medicare Other | Attending: Hematology | Admitting: Hematology

## 2020-10-31 ENCOUNTER — Inpatient Hospital Stay: Payer: Medicare Other

## 2020-10-31 ENCOUNTER — Other Ambulatory Visit: Payer: Self-pay

## 2020-10-31 VITALS — BP 121/81 | HR 75 | Temp 97.2°F | Resp 18 | Ht 64.0 in | Wt 147.2 lb

## 2020-10-31 DIAGNOSIS — I10 Essential (primary) hypertension: Secondary | ICD-10-CM | POA: Insufficient documentation

## 2020-10-31 DIAGNOSIS — Z5112 Encounter for antineoplastic immunotherapy: Secondary | ICD-10-CM | POA: Diagnosis present

## 2020-10-31 DIAGNOSIS — K219 Gastro-esophageal reflux disease without esophagitis: Secondary | ICD-10-CM | POA: Insufficient documentation

## 2020-10-31 DIAGNOSIS — C50811 Malignant neoplasm of overlapping sites of right female breast: Secondary | ICD-10-CM | POA: Insufficient documentation

## 2020-10-31 DIAGNOSIS — Z170 Estrogen receptor positive status [ER+]: Secondary | ICD-10-CM | POA: Diagnosis not present

## 2020-10-31 DIAGNOSIS — C50919 Malignant neoplasm of unspecified site of unspecified female breast: Secondary | ICD-10-CM

## 2020-10-31 DIAGNOSIS — Z79811 Long term (current) use of aromatase inhibitors: Secondary | ICD-10-CM | POA: Diagnosis not present

## 2020-10-31 DIAGNOSIS — D3501 Benign neoplasm of right adrenal gland: Secondary | ICD-10-CM | POA: Diagnosis not present

## 2020-10-31 DIAGNOSIS — G629 Polyneuropathy, unspecified: Secondary | ICD-10-CM | POA: Diagnosis not present

## 2020-10-31 DIAGNOSIS — N270 Small kidney, unilateral: Secondary | ICD-10-CM | POA: Diagnosis not present

## 2020-10-31 DIAGNOSIS — Z95828 Presence of other vascular implants and grafts: Secondary | ICD-10-CM

## 2020-10-31 DIAGNOSIS — C787 Secondary malignant neoplasm of liver and intrahepatic bile duct: Secondary | ICD-10-CM | POA: Diagnosis not present

## 2020-10-31 DIAGNOSIS — Z7901 Long term (current) use of anticoagulants: Secondary | ICD-10-CM | POA: Diagnosis not present

## 2020-10-31 DIAGNOSIS — I700 Atherosclerosis of aorta: Secondary | ICD-10-CM | POA: Insufficient documentation

## 2020-10-31 DIAGNOSIS — Z7189 Other specified counseling: Secondary | ICD-10-CM

## 2020-10-31 DIAGNOSIS — R55 Syncope and collapse: Secondary | ICD-10-CM | POA: Diagnosis not present

## 2020-10-31 DIAGNOSIS — J45909 Unspecified asthma, uncomplicated: Secondary | ICD-10-CM | POA: Insufficient documentation

## 2020-10-31 DIAGNOSIS — D649 Anemia, unspecified: Secondary | ICD-10-CM | POA: Insufficient documentation

## 2020-10-31 DIAGNOSIS — Z79899 Other long term (current) drug therapy: Secondary | ICD-10-CM | POA: Insufficient documentation

## 2020-10-31 LAB — CBC WITH DIFFERENTIAL (CANCER CENTER ONLY)
Abs Immature Granulocytes: 0.01 10*3/uL (ref 0.00–0.07)
Basophils Absolute: 0.1 10*3/uL (ref 0.0–0.1)
Basophils Relative: 1 %
Eosinophils Absolute: 0.2 10*3/uL (ref 0.0–0.5)
Eosinophils Relative: 4 %
HCT: 29.9 % — ABNORMAL LOW (ref 36.0–46.0)
Hemoglobin: 9.4 g/dL — ABNORMAL LOW (ref 12.0–15.0)
Immature Granulocytes: 0 %
Lymphocytes Relative: 41 %
Lymphs Abs: 2.4 10*3/uL (ref 0.7–4.0)
MCH: 28.7 pg (ref 26.0–34.0)
MCHC: 31.4 g/dL (ref 30.0–36.0)
MCV: 91.4 fL (ref 80.0–100.0)
Monocytes Absolute: 0.4 10*3/uL (ref 0.1–1.0)
Monocytes Relative: 7 %
Neutro Abs: 2.8 10*3/uL (ref 1.7–7.7)
Neutrophils Relative %: 47 %
Platelet Count: 211 10*3/uL (ref 150–400)
RBC: 3.27 MIL/uL — ABNORMAL LOW (ref 3.87–5.11)
RDW: 16.2 % — ABNORMAL HIGH (ref 11.5–15.5)
WBC Count: 5.8 10*3/uL (ref 4.0–10.5)
nRBC: 0 % (ref 0.0–0.2)

## 2020-10-31 LAB — CMP (CANCER CENTER ONLY)
ALT: 14 U/L (ref 0–44)
AST: 25 U/L (ref 15–41)
Albumin: 3.4 g/dL — ABNORMAL LOW (ref 3.5–5.0)
Alkaline Phosphatase: 117 U/L (ref 38–126)
Anion gap: 10 (ref 5–15)
BUN: 19 mg/dL (ref 8–23)
CO2: 24 mmol/L (ref 22–32)
Calcium: 9.3 mg/dL (ref 8.9–10.3)
Chloride: 102 mmol/L (ref 98–111)
Creatinine: 1.33 mg/dL — ABNORMAL HIGH (ref 0.44–1.00)
GFR, Estimated: 45 mL/min — ABNORMAL LOW (ref >60)
Glucose, Bld: 116 mg/dL — ABNORMAL HIGH (ref 70–99)
Potassium: 4.3 mmol/L (ref 3.5–5.1)
Sodium: 136 mmol/L (ref 135–145)
Total Bilirubin: 0.4 mg/dL (ref 0.3–1.2)
Total Protein: 6.3 g/dL — ABNORMAL LOW (ref 6.5–8.1)

## 2020-10-31 MED ORDER — SODIUM CHLORIDE 0.9% FLUSH
10.0000 mL | INTRAVENOUS | Status: DC | PRN
  Administered 2020-10-31: 10 mL
  Filled 2020-10-31: qty 10

## 2020-10-31 MED ORDER — DIPHENHYDRAMINE HCL 25 MG PO CAPS
ORAL_CAPSULE | ORAL | Status: AC
  Filled 2020-10-31: qty 2

## 2020-10-31 MED ORDER — PALONOSETRON HCL INJECTION 0.25 MG/5ML
INTRAVENOUS | Status: AC
  Filled 2020-10-31: qty 5

## 2020-10-31 MED ORDER — HEPARIN SOD (PORK) LOCK FLUSH 100 UNIT/ML IV SOLN
500.0000 [IU] | Freq: Once | INTRAVENOUS | Status: AC | PRN
  Administered 2020-10-31: 500 [IU]
  Filled 2020-10-31: qty 5

## 2020-10-31 MED ORDER — SODIUM CHLORIDE 0.9 % IV SOLN
10.0000 mg | Freq: Once | INTRAVENOUS | Status: AC
  Administered 2020-10-31: 10 mg via INTRAVENOUS
  Filled 2020-10-31: qty 10

## 2020-10-31 MED ORDER — ACETAMINOPHEN 325 MG PO TABS
ORAL_TABLET | ORAL | Status: AC
  Filled 2020-10-31: qty 2

## 2020-10-31 MED ORDER — ACETAMINOPHEN 325 MG PO TABS
650.0000 mg | ORAL_TABLET | Freq: Once | ORAL | Status: AC
  Administered 2020-10-31: 650 mg via ORAL

## 2020-10-31 MED ORDER — PALONOSETRON HCL INJECTION 0.25 MG/5ML
0.2500 mg | Freq: Once | INTRAVENOUS | Status: AC
  Administered 2020-10-31: 0.25 mg via INTRAVENOUS

## 2020-10-31 MED ORDER — DIPHENHYDRAMINE HCL 25 MG PO CAPS
50.0000 mg | ORAL_CAPSULE | Freq: Once | ORAL | Status: AC
Start: 2020-10-31 — End: 2020-10-31
  Administered 2020-10-31: 50 mg via ORAL

## 2020-10-31 MED ORDER — DEXTROSE 5 % IV SOLN
Freq: Once | INTRAVENOUS | Status: AC
  Filled 2020-10-31: qty 250

## 2020-10-31 MED ORDER — FAM-TRASTUZUMAB DERUXTECAN-NXKI CHEMO 100 MG IV SOLR
4.5000 mg/kg | Freq: Once | INTRAVENOUS | Status: AC
  Administered 2020-10-31: 300 mg via INTRAVENOUS
  Filled 2020-10-31: qty 15

## 2020-10-31 NOTE — Patient Instructions (Signed)
[facility] Discharge Instructions for Patients Receiving Chemotherapy  Today you received the following chemotherapy agents: Enhertu  To help prevent nausea and vomiting after your treatment, we encourage you to take your nausea medication as directed.   If you develop nausea and vomiting that is not controlled by your nausea medication, call the clinic.   BELOW ARE SYMPTOMS THAT SHOULD BE REPORTED IMMEDIATELY:  *FEVER GREATER THAN 100.5 F  *CHILLS WITH OR WITHOUT FEVER  NAUSEA AND VOMITING THAT IS NOT CONTROLLED WITH YOUR NAUSEA MEDICATION  *UNUSUAL SHORTNESS OF BREATH  *UNUSUAL BRUISING OR BLEEDING  TENDERNESS IN MOUTH AND THROAT WITH OR WITHOUT PRESENCE OF ULCERS  *URINARY PROBLEMS  *BOWEL PROBLEMS  UNUSUAL RASH Items with * indicate a potential emergency and should be followed up as soon as possible.  Feel free to call the clinic should you have any questions or concerns. The clinic phone number is [phone].  Please show the CHEMO ALERT CARD at check-in to the Emergency Department and triage nurse.   

## 2020-11-01 LAB — CANCER ANTIGEN 27.29: CA 27.29: 88.1 U/mL — ABNORMAL HIGH (ref 0.0–38.6)

## 2020-11-03 ENCOUNTER — Other Ambulatory Visit: Payer: Self-pay | Admitting: Internal Medicine

## 2020-11-03 ENCOUNTER — Encounter: Payer: Self-pay | Admitting: Hematology

## 2020-11-03 ENCOUNTER — Other Ambulatory Visit (HOSPITAL_COMMUNITY): Payer: Self-pay

## 2020-11-03 MED ORDER — SPIRONOLACTONE 25 MG PO TABS
ORAL_TABLET | Freq: Every day | ORAL | 3 refills | Status: DC
  Filled 2020-11-03: qty 90, 90d supply, fill #0
  Filled 2021-03-10: qty 90, 90d supply, fill #1

## 2020-11-03 MED FILL — Losartan Potassium Tab 50 MG: ORAL | 15 days supply | Qty: 30 | Fill #0 | Status: CN

## 2020-11-03 MED FILL — Budesonide-Glycopyrrolate-Formoterol Aers 160-9-4.8 MCG/ACT: RESPIRATORY_TRACT | 30 days supply | Qty: 10.7 | Fill #0 | Status: AC

## 2020-11-03 MED FILL — Gabapentin Cap 300 MG: ORAL | 30 days supply | Qty: 270 | Fill #0 | Status: AC

## 2020-11-03 MED FILL — Omeprazole Cap Delayed Release 40 MG: ORAL | 30 days supply | Qty: 60 | Fill #0 | Status: AC

## 2020-11-04 ENCOUNTER — Other Ambulatory Visit (HOSPITAL_COMMUNITY): Payer: Self-pay

## 2020-11-04 MED FILL — Losartan Potassium Tab 50 MG: ORAL | 15 days supply | Qty: 30 | Fill #0 | Status: AC

## 2020-11-05 ENCOUNTER — Other Ambulatory Visit (HOSPITAL_COMMUNITY): Payer: Self-pay

## 2020-11-10 ENCOUNTER — Other Ambulatory Visit: Payer: Self-pay | Admitting: Hematology

## 2020-11-10 ENCOUNTER — Other Ambulatory Visit (HOSPITAL_COMMUNITY): Payer: Self-pay

## 2020-11-10 ENCOUNTER — Other Ambulatory Visit (HOSPITAL_COMMUNITY): Payer: Self-pay | Admitting: Internal Medicine

## 2020-11-10 DIAGNOSIS — C787 Secondary malignant neoplasm of liver and intrahepatic bile duct: Secondary | ICD-10-CM

## 2020-11-10 DIAGNOSIS — C50919 Malignant neoplasm of unspecified site of unspecified female breast: Secondary | ICD-10-CM

## 2020-11-10 MED FILL — Carvedilol Tab 3.125 MG: ORAL | 90 days supply | Qty: 180 | Fill #0 | Status: AC

## 2020-11-10 MED FILL — Duloxetine HCl Enteric Coated Pellets Cap 20 MG (Base Eq): ORAL | 30 days supply | Qty: 60 | Fill #0 | Status: AC

## 2020-11-11 ENCOUNTER — Other Ambulatory Visit (HOSPITAL_COMMUNITY): Payer: Self-pay

## 2020-11-11 MED ORDER — RIVAROXABAN 20 MG PO TABS
ORAL_TABLET | Freq: Every day | ORAL | 5 refills | Status: DC
Start: 2020-11-11 — End: 2021-03-27
  Filled 2020-11-11: qty 30, 30d supply, fill #0
  Filled 2020-11-24 – 2020-12-06 (×2): qty 30, 30d supply, fill #1
  Filled 2021-01-10: qty 30, 30d supply, fill #2
  Filled 2021-02-09 – 2021-02-10 (×2): qty 30, 30d supply, fill #3
  Filled 2021-03-16 – 2021-03-18 (×2): qty 30, 30d supply, fill #4
  Filled 2021-03-21: qty 30, 30d supply, fill #5

## 2020-11-12 ENCOUNTER — Other Ambulatory Visit (HOSPITAL_COMMUNITY): Payer: Self-pay

## 2020-11-12 MED ORDER — LOSARTAN POTASSIUM 50 MG PO TABS
100.0000 mg | ORAL_TABLET | Freq: Every day | ORAL | 3 refills | Status: DC
  Filled 2020-11-12 – 2020-11-16 (×2): qty 180, 90d supply, fill #0
  Filled 2020-11-24 – 2021-02-16 (×2): qty 180, 90d supply, fill #1

## 2020-11-17 ENCOUNTER — Other Ambulatory Visit (HOSPITAL_COMMUNITY): Payer: Self-pay

## 2020-11-17 NOTE — Progress Notes (Signed)
[place]   [phone] [phone]   Clinic Follow up Note   Patient Care Team: [name], DO as PCP - General (Family Medicine) [name], MD as Consulting Physician (Gastroenterology) [name], MD as Consulting Physician (Hematology)  Date of Service:  11/21/2020  CHIEF COMPLAINT: F/u for metastatic breast cancer  SUMMARY OF ONCOLOGIC HISTORY: Oncology History Overview Note  Cancer Staging Metastatic breast cancer [facility]) Staging form: Breast, AJCC 8th Edition - Clinical stage from 03/24/2018: Stage IV (cT2, cN1, pM1, G3, ER+, PR+, HER2+) - Signed by [name], MD on 03/30/2018     Metastatic breast cancer ([place])  01/30/2018 Procedure   Colonoscopy showed small polyp in the sigmoid colon, removed, the exam of colon including the terminal ileum was otherwise negative.   01/30/2018 Procedure   EGD by Dr. Collene Mares showed small hiatal hernia, a 8 mm polypoid lesion in the cardia, biopsied.   03/09/2018 Imaging   03/09/2018 US Abdomen IMPRESSION: 1. Mass lesions throughout the liver, consistent with metastatic disease. Liver as a somewhat nodular contour suggesting underlying hepatic cirrhosis. Inhomogeneous echotexture to the liver.  2. Cholelithiasis with mild gallbladder wall thickening. A degree of cholecystitis cannot be excluded by ultrasound.  3. Portions of pancreas obscured by gas. Visualized portions of pancreas appear normal.  4. Small right kidney. Etiology uncertain. This finding potentially may be indicative of renal artery stenosis. In this regard, question whether patient is hypertensive.   03/15/2018 Imaging   CT CAP with contrast  IMPRESSION: 1. Widespread hepatic metastasis. 2. 2.6 cm lateral right breast soft tissue nodule could represent a breast primary or an incidental benign lesion. Consider correlation with mammogram and ultrasound. 3. No definite source of primary malignancy identified within the abdomen or  pelvis. There is possible rectosigmoid junction wall thickening. Consider colonoscopy with attention to this area. 4. Distal esophageal wall thickening, suggesting esophagitis.   03/24/2018 Cancer Staging   Staging form: Breast, AJCC 8th Edition - Clinical stage from 03/24/2018: Stage IV (cT2, cN1, pM1, G3, ER+, PR+, HER2+) - Signed by [name], MD on 03/30/2018   03/24/2018 Initial Biopsy   Diagnosis 1. 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 Breast, right, needle core biopsy, 9 o'clock, 7cm from nipple - INVASIVE DUCTAL CARCINOMA. -The carcinoma is somewhat morphologically dissimilar from that in part 1. It appears grade III 3. Lymph node, needle/core biopsy, right axillary - METASTATIC CARCINOMA IN 1 OF 1 LYMPH NODE (1/1).   03/24/2018 Receptors her2   Breast biopsy: 1. Estrogen Receptor: 40%, POSITIVE, STRONG-MODERATE STAINING INTENSITY Progesterone Receptor: 70%, POSITIVE, STRONG STAINING INTENSITY Proliferation Marker Ki67: 20% HER 2 equivocal by IHC 2+, POSITIVE by FISH, ratio 2.4 and copy #4.2  2. Estrogen Receptor: 60%, POSITIVE, MODERATE STAINING INTENSITY Progesterone Receptor: 40%, POSITIVE, MODERATE STAINING INTENSITY Proliferation Marker Ki67: 20% HER2 (+) by IHC 3+   03/24/2018 Initial Diagnosis   Metastatic breast cancer ([place])   03/27/2018 Pathology Results   Diagnosis Liver, needle/core biopsy, Right - METASTATIC CARCINOMA TO LIVER, CONSISTENT WITH PATIENTS CLINICAL HISTORY OF PRIMARY BREAST CARCINOMA.  ER 80%+ PR40%+ HER2- (by [facility], IHC 2+)  Ki67 50%    03/28/2018 Pathology Results   03/28/2018 Surgical Pathology Diagnosis 1. Breast, left, needle core biopsy, 9 o'clock - FIBROCYSTIC CHANGES. - THERE IS NO EVIDENCE OF MALIGNANCY. 2. Breast, left, needle core biopsy, 2 o'clock - FIBROADENOMA. - THERE IS NO EVIDENCE OF  MALIGNANCY. - SEE COMMENT.   03/29/2018 Imaging    03/29/2018 Bone Scan IMPRESSION: No scintigraphic evidence of osseous metastatic disease.   03/30/2018 Imaging   Bone scan  IMPRESSION: No scintigraphic evidence of osseous metastatic disease.    04/07/2018 - 07/30/2020 Chemotherapy   First line chemo weekly Taxol and herceptin/Perjeta every 3 weeks starting 04/07/18. She developed infusion reaction to taxol and it was discontinued. Added Abraxane on C1D8, 2 weeks on/1 week off.  Abraxne stopped after 10/13/18 due to worsening Neuropathy. She has continued with maintenance Herceptin injection/Perjeta q3weeks. Herceptin changed to injection on 04/06/19. Both injections switched to combination [place] on 10/01/19. ----Stopped 07/30/20 due to disease progression in liver.    06/06/2018 Imaging   CT CAP IMPRESSION: 1. Generally improved appearance, with reduced axillary adenopathy and reduced enhancing component of the hepatic masses, with some of the hepatic mass is moderately smaller than on the prior exam. Reduced size of the right lateral breast mass compared to the prior 03/15/2018 exam. 2. New mild interstitial accentuation in the lungs, significance uncertain. Part of this appearance may be due to lower lung volumes on today's exam. 3. Mild wall thickening in the descending colon and upper rectum suggesting low-grade colitis/inflammation. Prominent stool throughout the colon favors constipation. 4. Other imaging findings of potential clinical significance: Aortic Atherosclerosis (ICD10-I70.0). Mild cardiomegaly. Mild nodularity in the right lower lobe appears stable. Contracted and thick-walled gallbladder.    09/18/2018 Imaging   CT CAP W Contrast 09/18/18  IMPRESSION: 1. Liver metastases have decreased in size. 2. Right breast mass, mild right axillary lymphadenopathy and scattered tiny right pulmonary nodules are all stable. 3. New mild left supraclavicular and left subpectoral lymphadenopathy, can not exclude progression of metastatic  nodal disease. 4. Moderate colorectal stool volume, which may indicate constipation. 5.  Aortic Atherosclerosis (ICD10-I70.0).   10/2018 - 07/30/2020 Anti-estrogen oral therapy   Letrozole 2.5 mg daily starting 10/2018 D/c to proceed with Inhertu treatment after liver met progression.    01/10/2019 Imaging   CT CAP W Contrast 01/10/19  IMPRESSION: 1. Continued improvement in the hepatic metastatic lesions which have reduced in size. 2. Stable mild left supraclavicular and subpectoral adenopathy. 3. Essentially stable small right lower lobe pulmonary nodule and separate small subpleural nodule along the right hemidiaphragm. Surveillance suggested. 4. Other imaging findings of potential clinical significance: Mild cardiomegaly. Mild circumferential distal esophageal wall thickening, the most Hays cause would be esophagitis. Airway thickening is present, suggesting bronchitis or reactive airways disease. Airway plugging in the lower lobes and in the right middle lobe. Stable small right adrenal adenoma.   05/15/2019 Imaging   CT CAP W Contrast 05/15/19  IMPRESSION: CT CHEST IMPRESSION   1. Similar appearance of borderline supraclavicular, axillary, and subpectoral adenopathy. 2. Improved and resolved right lower lobe pulmonary nodularity. 3. Esophageal air fluid level suggests dysmotility or gastroesophageal reflux.   CT ABDOMEN AND PELVIS IMPRESSION   1. Improved hepatic metastasis. 2. Small bowel mesenteric lymph nodes which are upper normal and mildly enlarged. Likely increased and similar as detailed above. Indeterminate. Recommend attention on follow-up. 3. Cholelithiasis. 4. Motion degradation throughout the lower chest and abdomen.   09/19/2019 Imaging   CT CAP W contrast  IMPRESSION: 1. Interval decrease in size of right axillary and subpectoral lymph nodes. There are no pathologically enlarged lymph nodes remaining in the chest, abdomen, or pelvis. No new  lymphadenopathy. 2. No significant change in post treatment appearance of multiple low-attenuation liver lesions, in keeping with treated metastases.  3. No evidence of new metastatic disease in the chest, abdomen, or pelvis. 4. Aortic Atherosclerosis (ICD10-I70.0).   11/16/2019 Imaging   IMRI Brain  MPRESSION: Minimal chronic microvascular ischemic changes. No acute intracranial process.   Active bilateral maxillary sinus disease with mild frontoethmoid mucosal thickening.   01/23/2020 Imaging   CT CAP W contrast  IMPRESSION: 1. Stable exam. No new or progressive interval findings. 2. Stable appearance of upper normal right axillary lymph nodes. Tiny subpectoral and left axillary nodes are unchanged. 3. Generally similar appearance of ill-defined hypoattenuating lesions in the liver compatible with treated metastases. 1 lesion in the central liver is minimally more conspicuous today, likely related to bolus timing and attention on follow-up recommended. No new suspicious liver lesion on today's study. 4. Stable 10 mm right adrenal nodule., indeterminate. Continued attention on follow-up imaging recommended. 5. Cholelithiasis. 6. Aortic Atherosclerosis (ICD10-I70.0).   02/11/2020 Breast MRI   IMPRESSION: 1. 7 millimeter focus of residual enhancement associated with the known malignancy in the 9 o'clock location of the RIGHT breast. 2. No significant enhancement in the known malignancy in the 11:30 o'clock location of the RIGHT breast. 3. Interval resolution of axillary adenopathy.   07/28/2020 Imaging   CT CAP  IMPRESSION: 1. Interval progression of hepatic metastases. 2. Stable indeterminate right adrenal nodule. 3. No new sites of metastatic disease in the chest, abdomen or pelvis. 4. Chronic findings include: Small hiatal hernia. Cholelithiasis. Aortic Atherosclerosis (ICD10-I70.0).     08/08/2020 -  Chemotherapy   Second-line Inhertu q3weeks starting 08/08/20        CURRENT THERAPY:  Second-lineEnhertu q3weeks starting1/21/22. Due to poor toleration with C1, she required dose reduction from C2.  INTERVAL HISTORY:  Teresa Hays is here for a follow up. She was last seen by me 10/31/20. She presents to the clinic alone.  She is clinically stable. Mild fatigue, functions well at home. She helps her daughter to take care of her new-born baby. No significant pain, nausea, abdominal discomfort or other new complaints.  Weight is stable.  She eats well.   All other systems were reviewed with the patient and are negative.  MEDICAL HISTORY:  Past Medical History:  Diagnosis Date  . GERD (gastroesophageal reflux disease)   . Hypertension   . Personal history of chemotherapy   . rt breast ca with mets to liver dx'd 03/2018    SURGICAL HISTORY: Past Surgical History:  Procedure Laterality Date  . BREAST BIOPSY Right 03/24/2018   CA x3  . BREAST BIOPSY Left 03/28/2018   neg  . BREAST BIOPSY Left 03/30/2018   neg  . COLONOSCOPY    . ESOPHAGOGASTRODUODENOSCOPY ENDOSCOPY    . IR IMAGING GUIDED PORT INSERTION  04/04/2018    I have reviewed the social history and family history with the patient and they are unchanged from previous note.  ALLERGIES:  has No Known Allergies.  MEDICATIONS:  Current Outpatient Medications  Medication Sig Dispense Refill  . amLODipine (NORVASC) 5 MG tablet TAKE 1 TABLET BY MOUTH ONCE DAILY 30 tablet 2  . Budeson-Glycopyrrol-Formoterol 160-9-4.8 MCG/ACT AERO INHALE 2 PUFFS BY MOUTH INTO THE LUNGS IN THE MORNING AND AT BEDTIME. 10.7 g 6  . carvedilol (COREG) 3.125 MG tablet TAKE 1 TABLET BY MOUTH TWICE DAILY. REPLACES ATENOLOL 180 tablet 3  . DULoxetine (CYMBALTA) 20 MG capsule TAKE 2 CAPSULES BY MOUTH DAILY 60 capsule 3  . gabapentin (NEURONTIN) 300 MG capsule MAY TAKE UP TO 3 CAPSULES THREE TIMES DAILY BY MOUTH  270 capsule 3  . lidocaine (XYLOCAINE) 2 % jelly Apply 1 application topically as needed.    .  lidocaine (XYLOCAINE) 2 % jelly APPLY OVER PORT A CATH TOPICALLY 1 HOUR BEFORE PORT BEING ACCESSED AS NEEDED 30 mL 2  . losartan (COZAAR) 50 MG tablet Take 2 tablets (100 mg total) by mouth daily. 180 tablet 3  . meclizine (ANTIVERT) 12.5 MG tablet Take 1 tablet (12.5 mg total) by mouth 3 (three) times daily as needed for dizziness. 30 tablet 2  . meclizine (ANTIVERT) 25 MG tablet TAKE 1/2 TABLET BY MOUTH 3 TIMES DAILY AS NEEDED FOR DIZZINESS 15 tablet 2  . montelukast (SINGULAIR) 10 MG tablet TAKE 1 TABLET (10 MG TOTAL) BY MOUTH AT BEDTIME. 30 tablet 5  . omeprazole (PRILOSEC) 40 MG capsule TAKE 1 CAPSULE BY MOUTH 2 TIMES DAILY 60 capsule 3  . ondansetron (ZOFRAN) 8 MG tablet TAKE 1 TABLET BY MOUTH EVERY 8 HOURS AS NEEDED FOR NAUSEA OR VOMITING 30 tablet 3  . potassium chloride SA (KLOR-CON) 20 MEQ tablet TAKE 1 TABLET BY MOUTH ONCE A DAY 30 tablet 3  . prochlorperazine (COMPAZINE) 10 MG tablet TAKE 1 TABLET BY MOUTH EVERY 6 HOURS AS NEEDED FOR NAUSEA OR VOMITING 30 tablet 3  . rivaroxaban (XARELTO) 20 MG TABS tablet TAKE 1 TABLET BY MOUTH DAILY WITH SUPPER 30 tablet 5  . spironolactone (ALDACTONE) 25 MG tablet TAKE 1 TABLET BY MOUTH ONCE DAILY 90 tablet 3  . traMADol (ULTRAM) 50 MG tablet TAKE 1 TABLET (50 MG TOTAL) BY MOUTH EVERY 6 (SIX) HOURS AS NEEDED FOR MODERATE OR SEVERE PAIN. 30 tablet 0  . Zoster Vaccine Adjuvanted (SHINGRIX) injection INJECT 0.5 MLS INTO THE MUSCLE ONCE FOR 1 DOSE. 1 each 1   No current facility-administered medications for this visit.   Facility-Administered Medications Ordered in Other Visits  Medication Dose Route Frequency Provider Last Rate Last Admin  . sodium chloride flush (NS) 0.9 % injection 10 mL  10 mL Intracatheter PRN [name], MD   10 mL at 05/28/20 0935    PHYSICAL EXAMINATION: ECOG PERFORMANCE STATUS: 1 - Symptomatic but completely ambulatory  Vitals:   11/21/20 0843  BP: 120/76  Pulse: 71  Resp: 18  Temp: (!) 96.6 F (35.9 C)  SpO2: 100%    Filed Weights   11/21/20 0843  Weight: 150 lb (68 kg)    GENERAL:alert, no distress and comfortable SKIN: skin color, texture, turgor are normal, no rashes or significant lesions EYES: normal, Conjunctiva are pink and non-injected, sclera clear NECK: supple, thyroid normal size, non-tender, without nodularity LYMPH:  no palpable lymphadenopathy in the cervical, axillary  LUNGS: clear to auscultation and percussion with normal breathing effort HEART: regular rate & rhythm and no murmurs and no lower extremity edema ABDOMEN:abdomen soft, non-tender and normal bowel sounds Musculoskeletal:no cyanosis of digits and no clubbing  NEURO: alert & oriented x 3 with fluent speech, no focal motor/sensory deficits Breasts: Breast inspection showed them to be symmetrical with no nipple discharge. Palpation of the breasts and axilla revealed no obvious mass that I could appreciate.   LABORATORY DATA:  I have reviewed the data as listed CBC Latest Ref Rng & Units 11/20/2020 10/31/2020 10/10/2020  WBC 4.0 - 10.5 K/uL 6.2 5.8 5.6  Hemoglobin 12.0 - 15.0 g/dL 9.6(L) 9.4(L) 8.9(L)  Hematocrit 36.0 - 46.0 % 29.8(L) 29.9(L) 27.9(L)  Platelets 150 - 400 K/uL 209 211 208     CMP Latest Ref Rng & Units 11/20/2020  10/31/2020 10/10/2020  Glucose 70 - 99 mg/dL 90 116(H) 90  BUN 8 - 23 mg/dL $Remove'17 19 15  'rpJcYLS$ Creatinine 0.44 - 1.00 mg/dL 1.31(H) 1.33(H) 1.19(H)  Sodium 135 - 145 mmol/L 136 136 137  Potassium 3.5 - 5.1 mmol/L 4.8 4.3 4.3  Chloride 98 - 111 mmol/L 101 102 104  CO2 22 - 32 mmol/L $RemoveB'26 24 25  'SurtnlWA$ Calcium 8.9 - 10.3 mg/dL 9.8 9.3 9.6  Total Protein 6.5 - 8.1 g/dL 6.7 6.3(L) 6.3(L)  Total Bilirubin 0.3 - 1.2 mg/dL 0.3 0.4 0.3  Alkaline Phos 38 - 126 U/L 124 117 116  AST 15 - 41 U/L $Remo'29 25 25  'KEsNL$ ALT 0 - 44 U/L $Remo'12 14 18      'FERGp$ RADIOGRAPHIC STUDIES: I have personally reviewed the radiological images as listed and agreed with the findings in the report. CT CHEST ABDOMEN PELVIS W CONTRAST  Addendum Date:  11/21/2020   ADDENDUM REPORT: 11/21/2020 09:21 ADDENDUM: Upon further review, the lesion that was suspected as enlarged in the central LEFT hepatic lobe is actually identified on comparison exam and is slightly smaller on today's scan. Lesion measures 13 mm x 10 mm image 47/2 compared to 18 mm x 16 mm on prior CT scan 07/28/2020. (No history of hepatic surgery. Reduction in volume of LEFT hepatic lobe compared remote scan 2019 scans likely represents treatment response and cirrhosis) IMPRESSION: All lesions in the liver are  stable or decreased in size. Electronically Signed   By: Suzy Bouchard M.D.   On: 11/21/2020 09:21   Result Date: 11/21/2020 CLINICAL DATA:  Assess treatment response. Restaging metastatic breast cancer. EXAM: CT CHEST, ABDOMEN, AND PELVIS WITH CONTRAST TECHNIQUE: Multidetector CT imaging of the chest, abdomen and pelvis was performed following the standard protocol during bolus administration of intravenous contrast. CONTRAST:  123mL OMNIPAQUE IOHEXOL 300 MG/ML  SOLN COMPARISON:  None. FINDINGS: CT CHEST FINDINGS Cardiovascular: Port in the anterior chest wall with tip in distal SVC. Mediastinum/Nodes: No significant vascular findings. Normal heart size. No pericardial effusion. Lungs/Pleura: If the patient is low risk for carcinoma, recommend follow-up noncontrast CT in 12 months. If high risk, recommend follow-up in 6 to 12 months per Fleischner criteria. Musculoskeletal: No aggressive osseous lesion. CT ABDOMEN AND PELVIS FINDINGS Hepatobiliary: Partial hepatectomy anatomy. Poorly marginated liver masses are predominantly decreased in size from comparison exam. Example hypoenhancing mass measuring 3.6 x 2.6 cm in the central LEFT hepatic lobe are decreased from 4.7 x 3.2 cm. Subcapsular lesion in the RIGHT hepatic lobe measures 1.7 x 1.6 cm (image 42/2) compared to 2.3 x 2.1 cm. Additional scattered lesions are similar. One lesion in the central LEFT hepatic lobe more conspicuous  measuring 1.1 cm (image 47/2) compared to 0.7 cm on prior. Pancreas: Pancreas is normal. No ductal dilatation. No pancreatic inflammation. Spleen: Normal spleen Adrenals/urinary tract: Stable RIGHT adrenal nodule. LEFT adrenal gland kidneys normal. The ureters and bladder normal. Stomach/Bowel: Stomach, small bowel, appendix, and cecum are normal. The colon and rectosigmoid colon are normal. Vascular/Lymphatic: Abdominal aorta is normal caliber. There is no retroperitoneal or periportal lymphadenopathy. No pelvic lymphadenopathy. Reproductive: Insert uterus Other: No peritoneal metastasis Musculoskeletal: No aggressive osseous lesion. IMPRESSION: Chest Impression: 1. No evidence of thoracic metastasis. Abdomen / Pelvis Impression: 1. Overall improvement in hepatic metastasis. Poorly marginated lesion in the medial margin of the liver is decreased in size and conspicuity. Subcapsular lesion RIGHT hepatic lobe is also decreased in size. 2. One lesion in the central LEFT hepatic lobe is more  conspicuous than comparison exam. Recommend continued surveillance. 3. No evidence of peritoneal metastasis or nodal metastasis in the abdomen pelvis. 4. No skeletal metastasis. Electronically Signed: By: Suzy Bouchard M.D. On: 11/20/2020 17:14     ASSESSMENT & PLAN:  Teresa Hays is a 65 y.o. female with   1. 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 Liver met was HER2-.  -Given the metastatic disease, her cancer is not curable but still treatable. -She had been treated withfirst linemaintenancetreatment with letrozole,trastuzumab and Perjeta, until recent disease progression on 07/28/20 CT CAP.  -I started her on  second-lineEnhertuq3weeks starting 08/08/20.Given poor toleration with C1 (nausea, diarrhea, poor appetite), dose reduced with C2 and she tolerated much better. -I personally reviewed her restaging CT scan from yesterday and compared to previous scan.  She has had a good partial response.  Her right breast mass is not palpable on exam anymore. -Lab reviewed, adequate for treatment, will proceed cycle 6 Enhertu today and continue every 3 weeks  2.Peripheral neuropathy, grade 2, Right Foot pain,Secondarytochemotherapy -Ipreviouslystropped Abraxane on 10/13/18. -S/p PT and steroid injection her right foot pain is controlled. She still has mild tingling neuropathy in her toes an fingers. She has adequate function and denies falls. -She will also continue OTC Calcium and Vit D,OralB12, Gabapentin 300mg 3 tabsTID andCymbalta to 40mg , Tramadol every other night for right foot pain.Well controlled. 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 Anemia, Secondary to chemo -Continue monitoring, will consider blood transfusion if hemoglobin less than 8. -On Enhertu, her anemia has worsened and moderate. Will continue to monitor.Stable.   5.Goal of Care Discussion,Social support,  -She lives alone, has her sister and niece in [place]. Her daughter lives in a few hours away  -On Ativan PRN due to anxietyabout diagnosis and treatment -The patient understands the goal of care is palliative. -She is full code  6.Asthma -She has been seen by Dr. Valeta Harms who notes this is related to her post nasal drip and her Acid reflux can contribute. -She is onPrilosec2 tabs BID,allergymedicationand nasal spray and inhalers.Her insurance denied Dexilant. -Her breathing has improved and adequate on new inhaler.Continue tof/u with Dr Limmie Patricia Dr Collene Mares.  7. Syncope, HTN  -During week 1 of C2 Enhertu she notes episode of syncope.  -She is on Amlodipine and  losartan and Coreg. If her systolic BP is below 637, she can hold amlodipine and losartan until in better range. I encouraged her to monitor her BP at home. -She has remained more hydrated and has not had another syncopal episode.  -Patient notes occasional upper body jerk recently. I discussed watching this closely at home and if continues to worsens will evaluate for seizure.    Plan: -Restaging CT scan reviewed, excellent partial response.  Reviewed with patient today. -Lab reviewed, adequate for treatment, will proceed with Enhertu today. -lab, f/u and treatment in 3 weeks     No problem-specific Assessment & Plan notes found for this encounter.   No orders of the defined types were placed in this encounter.  All questions were answered. The patient knows to call the clinic with any problems, questions or concerns. No barriers to learning was detected. The total time spent in the appointment was 30 minutes.     [name], MD  11/21/2020   I, Joslyn Devon, am acting as scribe for [name], MD.   I have reviewed the above documentation for accuracy and completeness, and I agree with the above.

## 2020-11-19 ENCOUNTER — Other Ambulatory Visit: Payer: Self-pay

## 2020-11-19 DIAGNOSIS — C50919 Malignant neoplasm of unspecified site of unspecified female breast: Secondary | ICD-10-CM

## 2020-11-20 ENCOUNTER — Ambulatory Visit (HOSPITAL_COMMUNITY)
Admission: RE | Admit: 2020-11-20 | Discharge: 2020-11-20 | Disposition: A | Discharge status: Home / Self Care | Payer: Medicare Other | Source: Ambulatory Visit | Attending: Hematology | Admitting: Hematology

## 2020-11-20 ENCOUNTER — Inpatient Hospital Stay: Payer: Medicare Other | Attending: Hematology

## 2020-11-20 ENCOUNTER — Other Ambulatory Visit: Payer: Self-pay

## 2020-11-20 DIAGNOSIS — Z170 Estrogen receptor positive status [ER+]: Secondary | ICD-10-CM | POA: Insufficient documentation

## 2020-11-20 DIAGNOSIS — C787 Secondary malignant neoplasm of liver and intrahepatic bile duct: Secondary | ICD-10-CM | POA: Insufficient documentation

## 2020-11-20 DIAGNOSIS — K449 Diaphragmatic hernia without obstruction or gangrene: Secondary | ICD-10-CM | POA: Insufficient documentation

## 2020-11-20 DIAGNOSIS — R160 Hepatomegaly, not elsewhere classified: Secondary | ICD-10-CM | POA: Insufficient documentation

## 2020-11-20 DIAGNOSIS — K8020 Calculus of gallbladder without cholecystitis without obstruction: Secondary | ICD-10-CM | POA: Insufficient documentation

## 2020-11-20 DIAGNOSIS — C50811 Malignant neoplasm of overlapping sites of right female breast: Secondary | ICD-10-CM | POA: Insufficient documentation

## 2020-11-20 DIAGNOSIS — Z7901 Long term (current) use of anticoagulants: Secondary | ICD-10-CM | POA: Insufficient documentation

## 2020-11-20 DIAGNOSIS — C50919 Malignant neoplasm of unspecified site of unspecified female breast: Secondary | ICD-10-CM | POA: Insufficient documentation

## 2020-11-20 DIAGNOSIS — R55 Syncope and collapse: Secondary | ICD-10-CM | POA: Insufficient documentation

## 2020-11-20 DIAGNOSIS — D3501 Benign neoplasm of right adrenal gland: Secondary | ICD-10-CM | POA: Insufficient documentation

## 2020-11-20 DIAGNOSIS — Z79899 Other long term (current) drug therapy: Secondary | ICD-10-CM | POA: Insufficient documentation

## 2020-11-20 DIAGNOSIS — Z86718 Personal history of other venous thrombosis and embolism: Secondary | ICD-10-CM | POA: Insufficient documentation

## 2020-11-20 DIAGNOSIS — D649 Anemia, unspecified: Secondary | ICD-10-CM

## 2020-11-20 DIAGNOSIS — R590 Localized enlarged lymph nodes: Secondary | ICD-10-CM | POA: Insufficient documentation

## 2020-11-20 DIAGNOSIS — T451X5A Adverse effect of antineoplastic and immunosuppressive drugs, initial encounter: Secondary | ICD-10-CM | POA: Insufficient documentation

## 2020-11-20 DIAGNOSIS — Z79811 Long term (current) use of aromatase inhibitors: Secondary | ICD-10-CM | POA: Insufficient documentation

## 2020-11-20 DIAGNOSIS — Z5112 Encounter for antineoplastic immunotherapy: Secondary | ICD-10-CM | POA: Insufficient documentation

## 2020-11-20 DIAGNOSIS — G629 Polyneuropathy, unspecified: Secondary | ICD-10-CM | POA: Insufficient documentation

## 2020-11-20 DIAGNOSIS — Z95828 Presence of other vascular implants and grafts: Secondary | ICD-10-CM

## 2020-11-20 DIAGNOSIS — K2100 Gastro-esophageal reflux disease with esophagitis, without bleeding: Secondary | ICD-10-CM | POA: Insufficient documentation

## 2020-11-20 DIAGNOSIS — N270 Small kidney, unilateral: Secondary | ICD-10-CM | POA: Insufficient documentation

## 2020-11-20 DIAGNOSIS — I700 Atherosclerosis of aorta: Secondary | ICD-10-CM | POA: Insufficient documentation

## 2020-11-20 DIAGNOSIS — D6481 Anemia due to antineoplastic chemotherapy: Secondary | ICD-10-CM | POA: Insufficient documentation

## 2020-11-20 LAB — CBC WITH DIFFERENTIAL (CANCER CENTER ONLY)
Abs Immature Granulocytes: 0.01 10*3/uL (ref 0.00–0.07)
Basophils Absolute: 0 10*3/uL (ref 0.0–0.1)
Basophils Relative: 1 %
Eosinophils Absolute: 0.2 10*3/uL (ref 0.0–0.5)
Eosinophils Relative: 3 %
HCT: 29.8 % — ABNORMAL LOW (ref 36.0–46.0)
Hemoglobin: 9.6 g/dL — ABNORMAL LOW (ref 12.0–15.0)
Immature Granulocytes: 0 %
Lymphocytes Relative: 31 %
Lymphs Abs: 1.9 10*3/uL (ref 0.7–4.0)
MCH: 29.5 pg (ref 26.0–34.0)
MCHC: 32.2 g/dL (ref 30.0–36.0)
MCV: 91.7 fL (ref 80.0–100.0)
Monocytes Absolute: 0.4 10*3/uL (ref 0.1–1.0)
Monocytes Relative: 6 %
Neutro Abs: 3.7 10*3/uL (ref 1.7–7.7)
Neutrophils Relative %: 59 %
Platelet Count: 209 10*3/uL (ref 150–400)
RBC: 3.25 MIL/uL — ABNORMAL LOW (ref 3.87–5.11)
RDW: 15.8 % — ABNORMAL HIGH (ref 11.5–15.5)
WBC Count: 6.2 10*3/uL (ref 4.0–10.5)
nRBC: 0 % (ref 0.0–0.2)

## 2020-11-20 LAB — CMP (CANCER CENTER ONLY)
ALT: 12 U/L (ref 0–44)
AST: 29 U/L (ref 15–41)
Albumin: 3.4 g/dL — ABNORMAL LOW (ref 3.5–5.0)
Alkaline Phosphatase: 124 U/L (ref 38–126)
Anion gap: 9 (ref 5–15)
BUN: 17 mg/dL (ref 8–23)
CO2: 26 mmol/L (ref 22–32)
Calcium: 9.8 mg/dL (ref 8.9–10.3)
Chloride: 101 mmol/L (ref 98–111)
Creatinine: 1.31 mg/dL — ABNORMAL HIGH (ref 0.44–1.00)
GFR, Estimated: 45 mL/min — ABNORMAL LOW (ref >60)
Glucose, Bld: 90 mg/dL (ref 70–99)
Potassium: 4.8 mmol/L (ref 3.5–5.1)
Sodium: 136 mmol/L (ref 135–145)
Total Bilirubin: 0.3 mg/dL (ref 0.3–1.2)
Total Protein: 6.7 g/dL (ref 6.5–8.1)

## 2020-11-20 LAB — FERRITIN: Ferritin: 56 ng/mL (ref 11–307)

## 2020-11-20 MED ORDER — SODIUM CHLORIDE (PF) 0.9 % IJ SOLN
INTRAMUSCULAR | Status: AC
  Filled 2020-11-20: qty 50

## 2020-11-20 MED ORDER — IOHEXOL 300 MG/ML  SOLN
100.0000 mL | Freq: Once | INTRAMUSCULAR | Status: AC | PRN
  Administered 2020-11-20: 100 mL via INTRAVENOUS

## 2020-11-20 MED ORDER — SODIUM CHLORIDE 0.9% FLUSH
10.0000 mL | INTRAVENOUS | Status: DC | PRN
  Administered 2020-11-20: 10 mL
  Filled 2020-11-20: qty 10

## 2020-11-20 MED ORDER — HEPARIN SOD (PORK) LOCK FLUSH 100 UNIT/ML IV SOLN
INTRAVENOUS | Status: AC
  Filled 2020-11-20: qty 5

## 2020-11-20 MED ORDER — HEPARIN SOD (PORK) LOCK FLUSH 100 UNIT/ML IV SOLN
500.0000 [IU] | Freq: Once | INTRAVENOUS | Status: AC
  Administered 2020-11-20: 500 [IU] via INTRAVENOUS

## 2020-11-20 NOTE — Patient Instructions (Signed)
Implanted Port Insertion, Care After This sheet gives you information about how to care for yourself after your procedure. Your health care provider may also give you more specific instructions. If you have problems or questions, contact your health care provider. What can I expect after the procedure? After the procedure, it is common to have:  Discomfort at the port insertion site.  Bruising on the skin over the port. This should improve over 3-4 days. Follow these instructions at home: Port care  After your port is placed, you will get a manufacturer's information card. The card has information about your port. Keep this card with you at all times.  Take care of the port as told by your health care provider. Ask your health care provider if you or a family member can get training for taking care of the port at home. A home health care nurse may also take care of the port.  Make sure to remember what type of port you have. Incision care  Follow instructions from your health care provider about how to take care of your port insertion site. Make sure you: ? Wash your hands with soap and water before and after you change your bandage (dressing). If soap and water are not available, use hand sanitizer. ? Change your dressing as told by your health care provider. ? Leave stitches (sutures), skin glue, or adhesive strips in place. These skin closures may need to stay in place for 2 weeks or longer. If adhesive strip edges start to loosen and curl up, you may trim the loose edges. Do not remove adhesive strips completely unless your health care provider tells you to do that.  Check your port insertion site every day for signs of infection. Check for: ? Redness, swelling, or pain. ? Fluid or blood. ? Warmth. ? Pus or a bad smell.      Activity  Return to your normal activities as told by your health care provider. Ask your health care provider what activities are safe for you.  Do not  lift anything that is heavier than 10 lb (4.5 kg), or the limit that you are told, until your health care provider says that it is safe. General instructions  Take over-the-counter and prescription medicines only as told by your health care provider.  Do not take baths, swim, or use a hot tub until your health care provider approves. Ask your health care provider if you may take showers. You may only be allowed to take sponge baths.  Do not drive for 24 hours if you were given a sedative during your procedure.  Wear a medical alert bracelet in case of an emergency. This will tell any health care providers that you have a port.  Keep all follow-up visits as told by your health care provider. This is important. Contact a health care provider if:  You cannot flush your port with saline as directed, or you cannot draw blood from the port.  You have a fever or chills.  You have redness, swelling, or pain around your port insertion site.  You have fluid or blood coming from your port insertion site.  Your port insertion site feels warm to the touch.  You have pus or a bad smell coming from the port insertion site. Get help right away if:  You have chest pain or shortness of breath.  You have bleeding from your port that you cannot control. Summary  Take care of the port as told by your   health care provider. Keep the manufacturer's information card with you at all times.  Change your dressing as told by your health care provider.  Contact a health care provider if you have a fever or chills or if you have redness, swelling, or pain around your port insertion site.  Keep all follow-up visits as told by your health care provider. This information is not intended to replace advice given to you by your health care provider. Make sure you discuss any questions you have with your health care provider. Document Revised: 01/31/2018 Document Reviewed: 01/31/2018 Elsevier Patient Education   2021 [employer]  

## 2020-11-21 ENCOUNTER — Inpatient Hospital Stay: Payer: Medicare Other

## 2020-11-21 ENCOUNTER — Inpatient Hospital Stay (HOSPITAL_BASED_OUTPATIENT_CLINIC_OR_DEPARTMENT_OTHER): Payer: Medicare Other | Admitting: Hematology

## 2020-11-21 ENCOUNTER — Encounter: Payer: Self-pay | Admitting: Hematology

## 2020-11-21 ENCOUNTER — Other Ambulatory Visit: Payer: Medicare Other

## 2020-11-21 VITALS — BP 120/76 | HR 71 | Temp 96.6°F | Resp 18 | Ht 64.0 in | Wt 150.0 lb

## 2020-11-21 DIAGNOSIS — K2100 Gastro-esophageal reflux disease with esophagitis, without bleeding: Secondary | ICD-10-CM | POA: Diagnosis not present

## 2020-11-21 DIAGNOSIS — K449 Diaphragmatic hernia without obstruction or gangrene: Secondary | ICD-10-CM | POA: Diagnosis not present

## 2020-11-21 DIAGNOSIS — Z170 Estrogen receptor positive status [ER+]: Secondary | ICD-10-CM | POA: Diagnosis not present

## 2020-11-21 DIAGNOSIS — C50919 Malignant neoplasm of unspecified site of unspecified female breast: Secondary | ICD-10-CM | POA: Diagnosis not present

## 2020-11-21 DIAGNOSIS — D6481 Anemia due to antineoplastic chemotherapy: Secondary | ICD-10-CM | POA: Diagnosis not present

## 2020-11-21 DIAGNOSIS — Z86718 Personal history of other venous thrombosis and embolism: Secondary | ICD-10-CM | POA: Diagnosis not present

## 2020-11-21 DIAGNOSIS — G629 Polyneuropathy, unspecified: Secondary | ICD-10-CM | POA: Diagnosis not present

## 2020-11-21 DIAGNOSIS — Z7901 Long term (current) use of anticoagulants: Secondary | ICD-10-CM | POA: Diagnosis not present

## 2020-11-21 DIAGNOSIS — Z7189 Other specified counseling: Secondary | ICD-10-CM

## 2020-11-21 DIAGNOSIS — T451X5A Adverse effect of antineoplastic and immunosuppressive drugs, initial encounter: Secondary | ICD-10-CM | POA: Diagnosis not present

## 2020-11-21 DIAGNOSIS — I700 Atherosclerosis of aorta: Secondary | ICD-10-CM | POA: Diagnosis not present

## 2020-11-21 DIAGNOSIS — N270 Small kidney, unilateral: Secondary | ICD-10-CM | POA: Diagnosis not present

## 2020-11-21 DIAGNOSIS — C50811 Malignant neoplasm of overlapping sites of right female breast: Secondary | ICD-10-CM | POA: Diagnosis present

## 2020-11-21 DIAGNOSIS — K8020 Calculus of gallbladder without cholecystitis without obstruction: Secondary | ICD-10-CM | POA: Diagnosis not present

## 2020-11-21 DIAGNOSIS — Z79811 Long term (current) use of aromatase inhibitors: Secondary | ICD-10-CM | POA: Diagnosis not present

## 2020-11-21 DIAGNOSIS — R55 Syncope and collapse: Secondary | ICD-10-CM | POA: Diagnosis not present

## 2020-11-21 DIAGNOSIS — C787 Secondary malignant neoplasm of liver and intrahepatic bile duct: Secondary | ICD-10-CM | POA: Diagnosis not present

## 2020-11-21 DIAGNOSIS — Z5112 Encounter for antineoplastic immunotherapy: Secondary | ICD-10-CM | POA: Diagnosis present

## 2020-11-21 DIAGNOSIS — Z79899 Other long term (current) drug therapy: Secondary | ICD-10-CM | POA: Diagnosis not present

## 2020-11-21 DIAGNOSIS — D3501 Benign neoplasm of right adrenal gland: Secondary | ICD-10-CM | POA: Diagnosis not present

## 2020-11-21 DIAGNOSIS — R590 Localized enlarged lymph nodes: Secondary | ICD-10-CM | POA: Diagnosis not present

## 2020-11-21 DIAGNOSIS — R160 Hepatomegaly, not elsewhere classified: Secondary | ICD-10-CM | POA: Diagnosis not present

## 2020-11-21 MED ORDER — SODIUM CHLORIDE 0.9% FLUSH
10.0000 mL | INTRAVENOUS | Status: DC | PRN
  Administered 2020-11-21: 10 mL
  Filled 2020-11-21: qty 10

## 2020-11-21 MED ORDER — PALONOSETRON HCL INJECTION 0.25 MG/5ML
INTRAVENOUS | Status: AC
  Filled 2020-11-21: qty 5

## 2020-11-21 MED ORDER — FAM-TRASTUZUMAB DERUXTECAN-NXKI CHEMO 100 MG IV SOLR
4.5000 mg/kg | Freq: Once | INTRAVENOUS | Status: AC
  Administered 2020-11-21: 300 mg via INTRAVENOUS
  Filled 2020-11-21: qty 15

## 2020-11-21 MED ORDER — DIPHENHYDRAMINE HCL 25 MG PO CAPS
ORAL_CAPSULE | ORAL | Status: AC
  Filled 2020-11-21: qty 2

## 2020-11-21 MED ORDER — HEPARIN SOD (PORK) LOCK FLUSH 100 UNIT/ML IV SOLN
500.0000 [IU] | Freq: Once | INTRAVENOUS | Status: AC | PRN
  Administered 2020-11-21: 500 [IU]
  Filled 2020-11-21: qty 5

## 2020-11-21 MED ORDER — DEXTROSE 5 % IV SOLN
Freq: Once | INTRAVENOUS | Status: AC
  Filled 2020-11-21: qty 250

## 2020-11-21 MED ORDER — PALONOSETRON HCL INJECTION 0.25 MG/5ML
0.2500 mg | Freq: Once | INTRAVENOUS | Status: AC
  Administered 2020-11-21: 0.25 mg via INTRAVENOUS

## 2020-11-21 MED ORDER — ACETAMINOPHEN 325 MG PO TABS
650.0000 mg | ORAL_TABLET | Freq: Once | ORAL | Status: AC
  Administered 2020-11-21: 650 mg via ORAL

## 2020-11-21 MED ORDER — DIPHENHYDRAMINE HCL 25 MG PO CAPS
50.0000 mg | ORAL_CAPSULE | Freq: Once | ORAL | Status: AC
Start: 2020-11-21 — End: 2020-11-21
  Administered 2020-11-21: 50 mg via ORAL

## 2020-11-21 MED ORDER — SODIUM CHLORIDE 0.9 % IV SOLN
10.0000 mg | Freq: Once | INTRAVENOUS | Status: AC
  Administered 2020-11-21: 10 mg via INTRAVENOUS
  Filled 2020-11-21: qty 10

## 2020-11-21 MED ORDER — ACETAMINOPHEN 325 MG PO TABS
ORAL_TABLET | ORAL | Status: AC
  Filled 2020-11-21: qty 2

## 2020-11-21 NOTE — Patient Instructions (Signed)
[facility]  Discharge Instructions: Thank you for choosing [facility] to provide your oncology and hematology care.   If you have a lab appointment with the [facility], please go directly to the [facility] and check in at the registration area.   Wear comfortable clothing and clothing appropriate for easy access to any Portacath or PICC line.   We strive to give you quality time with your provider. You may need to reschedule your appointment if you arrive late (15 or more minutes).  Arriving late affects you and other patients whose appointments are after yours.  Also, if you miss three or more appointments without notifying the office, you may be dismissed from the clinic at the provider's discretion.      For prescription refill requests, have your pharmacy contact our office and allow 72 hours for refills to be completed.    Today you received the following chemotherapy and/or immunotherapy agents: enhertu.       To help prevent nausea and vomiting after your treatment, we encourage you to take your nausea medication as directed.  BELOW ARE SYMPTOMS THAT SHOULD BE REPORTED IMMEDIATELY: . *FEVER GREATER THAN 100.4 F (38 C) OR HIGHER . *CHILLS OR SWEATING . *NAUSEA AND VOMITING THAT IS NOT CONTROLLED WITH YOUR NAUSEA MEDICATION . *UNUSUAL SHORTNESS OF BREATH . *UNUSUAL BRUISING OR BLEEDING . *URINARY PROBLEMS (pain or burning when urinating, or frequent urination) . *BOWEL PROBLEMS (unusual diarrhea, constipation, pain near the anus) . TENDERNESS IN MOUTH AND THROAT WITH OR WITHOUT PRESENCE OF ULCERS (sore throat, sores in mouth, or a toothache) . UNUSUAL RASH, SWELLING OR PAIN  . UNUSUAL VAGINAL DISCHARGE OR ITCHING   Items with * indicate a potential emergency and should be followed up as soon as possible or go to the Emergency Department if any problems should occur.  Please show the CHEMOTHERAPY ALERT CARD or IMMUNOTHERAPY ALERT  CARD at check-in to the Emergency Department and triage nurse.  Should you have questions after your visit or need to cancel or reschedule your appointment, please contact [facility]  Dept: [phone]  and follow the prompts.  Office hours are 8:00 a.m. to 4:30 p.m. Monday - Friday. Please note that voicemails left after 4:00 p.m. may not be returned until the following business day.  We are closed weekends and major holidays. You have access to a nurse at all times for urgent questions. Please call the main number to the clinic Dept: [phone] and follow the prompts.   For any non-urgent questions, you may also contact your provider using MyChart. We now offer e-Visits for anyone 65 and older to request care online for non-urgent symptoms. For details visit [email or web address].   Also download the MyChart app! Go to the app store, search "MyChart", open the app, select [facility], and log in with your MyChart username and password.  Due to Covid, a mask is required upon entering the hospital/clinic. If you do not have a mask, one will be given to you upon arrival. For doctor visits, patients may have 1 support person aged 65 or older with them. For treatment visits, patients cannot have anyone with them due to current Covid guidelines and our immunocompromised population.

## 2020-11-22 ENCOUNTER — Other Ambulatory Visit: Payer: Self-pay | Admitting: Primary Care

## 2020-11-22 MED FILL — Potassium Chloride Microencapsulated Crys ER Tab 20 mEq: ORAL | 30 days supply | Qty: 30 | Fill #1 | Status: AC

## 2020-11-24 ENCOUNTER — Other Ambulatory Visit (HOSPITAL_COMMUNITY): Payer: Self-pay

## 2020-11-24 LAB — METHYLMALONIC ACID, SERUM: Methylmalonic Acid, Quantitative: 179 nmol/L (ref 0–378)

## 2020-11-24 MED ORDER — MONTELUKAST SODIUM 10 MG PO TABS
ORAL_TABLET | Freq: Every day | ORAL | 5 refills | Status: AC
  Filled 2020-11-24: qty 30, 30d supply, fill #0
  Filled 2020-12-20: qty 30, 30d supply, fill #1
  Filled 2021-01-24: qty 30, 30d supply, fill #2
  Filled 2021-03-02: qty 30, 30d supply, fill #3
  Filled 2021-04-01: qty 30, 30d supply, fill #4
  Filled 2021-05-09: qty 30, 30d supply, fill #5

## 2020-11-24 MED FILL — Potassium Chloride Microencapsulated Crys ER Tab 20 mEq: ORAL | 30 days supply | Qty: 30 | Fill #2 | Status: CN

## 2020-11-25 ENCOUNTER — Other Ambulatory Visit (HOSPITAL_COMMUNITY): Payer: Self-pay

## 2020-11-25 MED FILL — Meclizine HCl Tab 25 MG: ORAL | 10 days supply | Qty: 15 | Fill #0 | Status: AC

## 2020-11-29 ENCOUNTER — Other Ambulatory Visit: Payer: Self-pay | Admitting: Nurse Practitioner

## 2020-11-29 DIAGNOSIS — G620 Drug-induced polyneuropathy: Secondary | ICD-10-CM

## 2020-11-29 DIAGNOSIS — K219 Gastro-esophageal reflux disease without esophagitis: Secondary | ICD-10-CM

## 2020-11-29 DIAGNOSIS — T451X5A Adverse effect of antineoplastic and immunosuppressive drugs, initial encounter: Secondary | ICD-10-CM

## 2020-12-01 ENCOUNTER — Other Ambulatory Visit (HOSPITAL_COMMUNITY): Payer: Self-pay

## 2020-12-01 MED ORDER — OMEPRAZOLE 40 MG PO CPDR
DELAYED_RELEASE_CAPSULE | Freq: Two times a day (BID) | ORAL | 3 refills | Status: DC
  Filled 2020-12-01: qty 60, 30d supply, fill #0
  Filled 2020-12-27: qty 60, 30d supply, fill #1
  Filled 2021-02-01: qty 60, 30d supply, fill #2
  Filled 2021-03-02: qty 60, 30d supply, fill #3

## 2020-12-01 MED ORDER — GABAPENTIN 300 MG PO CAPS
ORAL_CAPSULE | ORAL | 3 refills | Status: DC
  Filled 2020-12-01: qty 270, 30d supply, fill #0
  Filled 2021-01-10: qty 270, 90d supply, fill #1
  Filled 2021-01-15: qty 270, 30d supply, fill #1
  Filled 2021-01-15 (×2): qty 270, 90d supply, fill #1
  Filled 2021-02-09: qty 270, 30d supply, fill #2
  Filled 2021-03-10: qty 270, 30d supply, fill #3

## 2020-12-06 ENCOUNTER — Other Ambulatory Visit: Payer: Self-pay | Admitting: Hematology

## 2020-12-06 DIAGNOSIS — T451X5A Adverse effect of antineoplastic and immunosuppressive drugs, initial encounter: Secondary | ICD-10-CM

## 2020-12-06 DIAGNOSIS — C50919 Malignant neoplasm of unspecified site of unspecified female breast: Secondary | ICD-10-CM

## 2020-12-06 DIAGNOSIS — G620 Drug-induced polyneuropathy: Secondary | ICD-10-CM

## 2020-12-08 ENCOUNTER — Encounter: Payer: Self-pay | Admitting: Hematology

## 2020-12-08 ENCOUNTER — Other Ambulatory Visit (HOSPITAL_COMMUNITY): Payer: Self-pay

## 2020-12-08 MED ORDER — DULOXETINE HCL 20 MG PO CPEP
ORAL_CAPSULE | Freq: Every day | ORAL | 3 refills | Status: DC
  Filled 2020-12-08: qty 60, 30d supply, fill #0
  Filled 2021-01-10: qty 60, 30d supply, fill #1
  Filled 2021-02-09: qty 60, 30d supply, fill #2
  Filled 2021-03-10: qty 60, 30d supply, fill #3

## 2020-12-08 NOTE — Progress Notes (Signed)
[facility]   Telephone:([phone]) [phone] Fax:([phone]   Clinic Follow up Note   Patient Care Team: [name], DO as PCP - General (Family Medicine) [name], MD as Consulting Physician (Gastroenterology) [name], MD as Consulting Physician (Hematology)  Date of Service:  12/12/2020  CHIEF COMPLAINT:  F/u for metastatic breast cancer  SUMMARY OF ONCOLOGIC HISTORY: Oncology History Overview Note  Cancer Staging Metastatic breast cancer [facility]) Staging form: Breast, AJCC 8th Edition - Clinical stage from 03/24/2018: Stage IV (cT2, cN1, pM1, G3, ER+, PR+, HER2+) - Signed by [name], MD on 03/30/2018     Metastatic breast cancer ([facility])  01/30/2018 Procedure   Colonoscopy showed small polyp in the sigmoid colon, removed, the exam of colon including the terminal ileum was otherwise negative.   01/30/2018 Procedure   EGD by Dr. Collene Mares showed small hiatal hernia, a 8 mm polypoid lesion in the cardia, biopsied.   03/09/2018 Imaging   03/09/2018 US Abdomen IMPRESSION: 1. Mass lesions throughout the liver, consistent with metastatic disease. Liver as a somewhat nodular contour suggesting underlying hepatic cirrhosis. Inhomogeneous echotexture to the liver.  2. Cholelithiasis with mild gallbladder wall thickening. A degree of cholecystitis cannot be excluded by ultrasound.  3. Portions of pancreas obscured by gas. Visualized portions of pancreas appear normal.  4. Small right kidney. Etiology uncertain. This finding potentially may be indicative of renal artery stenosis. In this regard, question whether patient is hypertensive.   03/15/2018 Imaging   CT CAP with contrast  IMPRESSION: 1. Widespread hepatic metastasis. 2. 2.6 cm lateral right breast soft tissue nodule could represent a breast primary or an incidental benign lesion. Consider correlation with mammogram and ultrasound. 3. No definite source of primary malignancy identified within the abdomen or  pelvis. There is possible rectosigmoid junction wall thickening. Consider colonoscopy with attention to this area. 4. Distal esophageal wall thickening, suggesting esophagitis.   03/24/2018 Cancer Staging   Staging form: Breast, AJCC 8th Edition - Clinical stage from 03/24/2018: Stage IV (cT2, cN1, pM1, G3, ER+, PR+, HER2+) - Signed by [name], MD on 03/30/2018   03/24/2018 Initial Biopsy   Diagnosis 1. 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 Breast, right, needle core biopsy, 9 o'clock, 7cm from nipple - INVASIVE DUCTAL CARCINOMA. -The carcinoma is somewhat morphologically dissimilar from that in part 1. It appears grade III 3. Lymph node, needle/core biopsy, right axillary - METASTATIC CARCINOMA IN 1 OF 1 LYMPH NODE (1/1).   03/24/2018 Receptors her2   Breast biopsy: 1. Estrogen Receptor: 40%, POSITIVE, STRONG-MODERATE STAINING INTENSITY Progesterone Receptor: 70%, POSITIVE, STRONG STAINING INTENSITY Proliferation Marker Ki67: 20% HER 2 equivocal by IHC 2+, POSITIVE by FISH, ratio 2.4 and copy #4.2  2. Estrogen Receptor: 60%, POSITIVE, MODERATE STAINING INTENSITY Progesterone Receptor: 40%, POSITIVE, MODERATE STAINING INTENSITY Proliferation Marker Ki67: 20% HER2 (+) by IHC 3+   03/24/2018 Initial Diagnosis   Metastatic breast cancer ([facility])   03/27/2018 Pathology Results   Diagnosis Liver, needle/core biopsy, Right - METASTATIC CARCINOMA TO LIVER, CONSISTENT WITH PATIENTS CLINICAL HISTORY OF PRIMARY BREAST CARCINOMA.  ER 80%+ PR40%+ HER2- (by [facility], IHC 2+)  Ki67 50%    03/28/2018 Pathology Results   03/28/2018 Surgical Pathology Diagnosis 1. Breast, left, needle core biopsy, 9 o'clock - FIBROCYSTIC CHANGES. - THERE IS NO EVIDENCE OF MALIGNANCY. 2. Breast, left, needle core biopsy, 2 o'clock - FIBROADENOMA. - THERE IS NO EVIDENCE  OF MALIGNANCY. - SEE COMMENT.   03/29/2018 Imaging    03/29/2018 Bone Scan IMPRESSION: No scintigraphic evidence of osseous metastatic disease.   03/30/2018 Imaging   Bone scan  IMPRESSION: No scintigraphic evidence of osseous metastatic disease.    04/07/2018 - 07/30/2020 Chemotherapy   First line chemo weekly Taxol and herceptin/Perjeta every 3 weeks starting 04/07/18. She developed infusion reaction to taxol and it was discontinued. Added Abraxane on C1D8, 2 weeks on/1 week off.  Abraxne stopped after 10/13/18 due to worsening Neuropathy. She has continued with maintenance Herceptin injection/Perjeta q3weeks. Herceptin changed to injection on 04/06/19. Both injections switched to combination [place] on 10/01/19. ----Stopped 07/30/20 due to disease progression in liver.    06/06/2018 Imaging   CT CAP IMPRESSION: 1. Generally improved appearance, with reduced axillary adenopathy and reduced enhancing component of the hepatic masses, with some of the hepatic mass is moderately smaller than on the prior exam. Reduced size of the right lateral breast mass compared to the prior 03/15/2018 exam. 2. New mild interstitial accentuation in the lungs, significance uncertain. Part of this appearance may be due to lower lung volumes on today's exam. 3. Mild wall thickening in the descending colon and upper rectum suggesting low-grade colitis/inflammation. Prominent stool throughout the colon favors constipation. 4. Other imaging findings of potential clinical significance: Aortic Atherosclerosis (ICD10-I70.0). Mild cardiomegaly. Mild nodularity in the right lower lobe appears stable. Contracted and thick-walled gallbladder.    09/18/2018 Imaging   CT CAP W Contrast 09/18/18  IMPRESSION: 1. Liver metastases have decreased in size. 2. Right breast mass, mild right axillary lymphadenopathy and scattered tiny right pulmonary nodules are all stable. 3. New mild left supraclavicular and left subpectoral lymphadenopathy, can not exclude progression of metastatic  nodal disease. 4. Moderate colorectal stool volume, which may indicate constipation. 5.  Aortic Atherosclerosis (ICD10-I70.0).   10/2018 - 07/30/2020 Anti-estrogen oral therapy   Letrozole 2.5 mg daily starting 10/2018 D/c to proceed with Inhertu treatment after liver met progression.    01/10/2019 Imaging   CT CAP W Contrast 01/10/19  IMPRESSION: 1. Continued improvement in the hepatic metastatic lesions which have reduced in size. 2. Stable mild left supraclavicular and subpectoral adenopathy. 3. Essentially stable small right lower lobe pulmonary nodule and separate small subpleural nodule along the right hemidiaphragm. Surveillance suggested. 4. Other imaging findings of potential clinical significance: Mild cardiomegaly. Mild circumferential distal esophageal wall thickening, the most common cause would be esophagitis. Airway thickening is present, suggesting bronchitis or reactive airways disease. Airway plugging in the lower lobes and in the right middle lobe. Stable small right adrenal adenoma.   05/15/2019 Imaging   CT CAP W Contrast 05/15/19  IMPRESSION: CT CHEST IMPRESSION   1. Similar appearance of borderline supraclavicular, axillary, and subpectoral adenopathy. 2. Improved and resolved right lower lobe pulmonary nodularity. 3. Esophageal air fluid level suggests dysmotility or gastroesophageal reflux.   CT ABDOMEN AND PELVIS IMPRESSION   1. Improved hepatic metastasis. 2. Small bowel mesenteric lymph nodes which are upper normal and mildly enlarged. Likely increased and similar as detailed above. Indeterminate. Recommend attention on follow-up. 3. Cholelithiasis. 4. Motion degradation throughout the lower chest and abdomen.   09/19/2019 Imaging   CT CAP W contrast  IMPRESSION: 1. Interval decrease in size of right axillary and subpectoral lymph nodes. There are no pathologically enlarged lymph nodes remaining in the chest, abdomen, or pelvis. No new  lymphadenopathy. 2. No significant change in post treatment appearance of multiple low-attenuation liver lesions, in keeping with treated  metastases. 3. No evidence of new metastatic disease in the chest, abdomen, or pelvis. 4. Aortic Atherosclerosis (ICD10-I70.0).   11/16/2019 Imaging   IMRI Brain  MPRESSION: Minimal chronic microvascular ischemic changes. No acute intracranial process.   Active bilateral maxillary sinus disease with mild frontoethmoid mucosal thickening.   01/23/2020 Imaging   CT CAP W contrast  IMPRESSION: 1. Stable exam. No new or progressive interval findings. 2. Stable appearance of upper normal right axillary lymph nodes. Tiny subpectoral and left axillary nodes are unchanged. 3. Generally similar appearance of ill-defined hypoattenuating lesions in the liver compatible with treated metastases. 1 lesion in the central liver is minimally more conspicuous today, likely related to bolus timing and attention on follow-up recommended. No new suspicious liver lesion on today's study. 4. Stable 10 mm right adrenal nodule., indeterminate. Continued attention on follow-up imaging recommended. 5. Cholelithiasis. 6. Aortic Atherosclerosis (ICD10-I70.0).   02/11/2020 Breast MRI   IMPRESSION: 1. 7 millimeter focus of residual enhancement associated with the known malignancy in the 9 o'clock location of the RIGHT breast. 2. No significant enhancement in the known malignancy in the 11:30 o'clock location of the RIGHT breast. 3. Interval resolution of axillary adenopathy.   07/28/2020 Imaging   CT CAP  IMPRESSION: 1. Interval progression of hepatic metastases. 2. Stable indeterminate right adrenal nodule. 3. No new sites of metastatic disease in the chest, abdomen or pelvis. 4. Chronic findings include: Small hiatal hernia. Cholelithiasis. Aortic Atherosclerosis (ICD10-I70.0).     08/08/2020 -  Chemotherapy   Second-line Inhertu q3weeks starting 08/08/20        CURRENT THERAPY:  Second-lineEnhertu q3weeks starting1/21/22. Due to poor toleration with C1, she required dose reduction from C2.  INTERVAL HISTORY:  Teresa Hays is here for a follow up. She presents to the clinic alone. She notes she is doing well. She notes she tolerated last cycle chemo well with no nausea. She notes she is eating adequately. She notes she has been sleeping more lately. She notes she will nap when the baby naps and she will wake up tired. She denies chest pain or any other pain currently. She wonders can she take sea moss. I reviewed her medication list with her. She would like another handicap sticker. She notes her COVID booster was cancelled today, because we are out of it. This will be rescheduled.     REVIEW OF SYSTEMS:   Constitutional: Denies fevers, chills or abnormal weight loss Eyes: Denies blurriness of vision Ears, nose, mouth, throat, and face: Denies mucositis or sore throat Respiratory: Denies cough, dyspnea or wheezes Cardiovascular: Denies palpitation, chest discomfort or lower extremity swelling Gastrointestinal:  Denies nausea, heartburn or change in bowel habits Skin: Denies abnormal skin rashes Lymphatics: Denies new lymphadenopathy or easy bruising Neurological:Denies numbness, tingling or new weaknesses Behavioral/Psych: Mood is stable, no new changes  All other systems were reviewed with the patient and are negative.  MEDICAL HISTORY:  Past Medical History:  Diagnosis Date  . GERD (gastroesophageal reflux disease)   . Hypertension   . Personal history of chemotherapy   . rt breast ca with mets to liver dx'd 03/2018    SURGICAL HISTORY: Past Surgical History:  Procedure Laterality Date  . BREAST BIOPSY Right 03/24/2018   CA x3  . BREAST BIOPSY Left 03/28/2018   neg  . BREAST BIOPSY Left 03/30/2018   neg  . COLONOSCOPY    . ESOPHAGOGASTRODUODENOSCOPY ENDOSCOPY    . IR IMAGING GUIDED PORT INSERTION  04/04/2018    I  have reviewed the social history and family history with the patient and they are unchanged from previous note.  ALLERGIES:  has No Known Allergies.  MEDICATIONS:  Current Outpatient Medications  Medication Sig Dispense Refill  . amLODipine (NORVASC) 5 MG tablet TAKE 1 TABLET BY MOUTH ONCE DAILY 30 tablet 2  . Budeson-Glycopyrrol-Formoterol 160-9-4.8 MCG/ACT AERO INHALE 2 PUFFS BY MOUTH INTO THE LUNGS IN THE MORNING AND AT BEDTIME. 10.7 g 6  . carvedilol (COREG) 3.125 MG tablet TAKE 1 TABLET BY MOUTH TWICE DAILY. REPLACES ATENOLOL 180 tablet 3  . DULoxetine (CYMBALTA) 20 MG capsule TAKE 2 CAPSULES BY MOUTH DAILY 60 capsule 3  . gabapentin (NEURONTIN) 300 MG capsule MAY TAKE UP TO 3 CAPSULES THREE TIMES DAILY BY MOUTH 270 capsule 3  . lidocaine (XYLOCAINE) 2 % jelly Apply 1 application topically as needed.    . lidocaine (XYLOCAINE) 2 % jelly APPLY OVER PORT A CATH TOPICALLY 1 HOUR BEFORE PORT BEING ACCESSED AS NEEDED 30 mL 2  . losartan (COZAAR) 50 MG tablet Take 2 tablets (100 mg total) by mouth daily. 180 tablet 3  . meclizine (ANTIVERT) 12.5 MG tablet Take 1 tablet (12.5 mg total) by mouth 3 (three) times daily as needed for dizziness. 30 tablet 2  . meclizine (ANTIVERT) 25 MG tablet TAKE 1/2 TABLET BY MOUTH 3 TIMES DAILY AS NEEDED FOR DIZZINESS 15 tablet 2  . montelukast (SINGULAIR) 10 MG tablet TAKE 1 TABLET (10 MG TOTAL) BY MOUTH AT BEDTIME. 30 tablet 5  . omeprazole (PRILOSEC) 40 MG capsule TAKE 1 CAPSULE BY MOUTH 2 TIMES DAILY 60 capsule 3  . ondansetron (ZOFRAN) 8 MG tablet TAKE 1 TABLET BY MOUTH EVERY 8 HOURS AS NEEDED FOR NAUSEA OR VOMITING 30 tablet 3  . potassium chloride SA (KLOR-CON) 20 MEQ tablet TAKE 1 TABLET BY MOUTH ONCE A DAY 30 tablet 3  . prochlorperazine (COMPAZINE) 10 MG tablet TAKE 1 TABLET BY MOUTH EVERY 6 HOURS AS NEEDED FOR NAUSEA OR VOMITING 30 tablet 3  . rivaroxaban (XARELTO) 20 MG TABS tablet TAKE 1 TABLET BY MOUTH DAILY WITH SUPPER 30 tablet 5  . spironolactone  (ALDACTONE) 25 MG tablet TAKE 1 TABLET BY MOUTH ONCE DAILY 90 tablet 3  . Zoster Vaccine Adjuvanted (SHINGRIX) injection INJECT 0.5 MLS INTO THE MUSCLE ONCE FOR 1 DOSE. 1 each 1   No current facility-administered medications for this visit.   Facility-Administered Medications Ordered in Other Visits  Medication Dose Route Frequency Provider Last Rate Last Admin  . fam-trastuzumab deruxtecan-nxki (ENHERTU) 300 mg in dextrose 5 % 100 mL chemo infusion  4.5 mg/kg (Treatment Plan Recorded) Intravenous Once [name], MD      . heparin lock flush 100 unit/mL  500 Units Intracatheter Once PRN [name], MD      . sodium chloride flush (NS) 0.9 % injection 10 mL  10 mL Intracatheter PRN [name], MD   10 mL at 05/28/20 0935  . sodium chloride flush (NS) 0.9 % injection 10 mL  10 mL Intracatheter PRN [name], MD        PHYSICAL EXAMINATION: ECOG PERFORMANCE STATUS: 1 - Symptomatic but completely ambulatory  Vitals:   12/12/20 0854  BP: 111/74  Pulse: 69  Resp: 19  Temp: (!) 96.6 F (35.9 C)  SpO2: 99%   Filed Weights   12/12/20 0854  Weight: 148 lb 14.4 oz (67.5 kg)    GENERAL:alert, no distress and comfortable SKIN: skin color, texture, turgor are normal, no rashes or  significant lesions EYES: normal, Conjunctiva are pink and non-injected, sclera clear  NECK: supple, thyroid normal size, non-tender, without nodularity LYMPH:  no palpable lymphadenopathy in the cervical, axillary  LUNGS: clear to auscultation and percussion with normal breathing effort HEART: regular rate & rhythm and no murmurs and no lower extremity edema ABDOMEN:abdomen soft, non-tender and normal bowel sounds Musculoskeletal:no cyanosis of digits and no clubbing  NEURO: alert & oriented x 3 with fluent speech, no focal motor/sensory deficits  LABORATORY DATA:  I have reviewed the data as listed CBC Latest Ref Rng & Units 12/12/2020 11/20/2020 10/31/2020  WBC 4.0 - 10.5 K/uL 4.6 6.2 5.8  Hemoglobin 12.0 - 15.0  g/dL 9.0(L) 9.6(L) 9.4(L)  Hematocrit 36.0 - 46.0 % 28.2(L) 29.8(L) 29.9(L)  Platelets 150 - 400 K/uL 205 209 211     CMP Latest Ref Rng & Units 12/12/2020 11/20/2020 10/31/2020  Glucose 70 - 99 mg/dL 96 90 116(H)  BUN 8 - 23 mg/dL 18 17 19   Creatinine 0.44 - 1.00 mg/dL 1.36(H) 1.31(H) 1.33(H)  Sodium 135 - 145 mmol/L 138 136 136  Potassium 3.5 - 5.1 mmol/L 4.5 4.8 4.3  Chloride 98 - 111 mmol/L 104 101 102  CO2 22 - 32 mmol/L 24 26 24   Calcium 8.9 - 10.3 mg/dL 9.8 9.8 9.3  Total Protein 6.5 - 8.1 g/dL 6.4(L) 6.7 6.3(L)  Total Bilirubin 0.3 - 1.2 mg/dL 0.3 0.3 0.4  Alkaline Phos 38 - 126 U/L 127(H) 124 117  AST 15 - 41 U/L 30 29 25   ALT 0 - 44 U/L 15 12 14       RADIOGRAPHIC STUDIES: I have personally reviewed the radiological images as listed and agreed with the findings in the report. No results found.   ASSESSMENT & PLAN:  Teresa Hays is a 65 y.o. female with   1. 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 Liver met was HER2-.  -Given the metastatic disease, her cancer is not curable but still treatable. -She had been treated withfirst linemaintenancetreatment with letrozole,trastuzumab and Perjeta, until recent disease progression on 07/28/20 CT CAP.  -I started her on second-lineEnhertuq3weeks starting 08/08/20.Given poor toleration with C1 (nausea, diarrhea, poor appetite), dose reduced with C2and she tolerated much better. -restaging CT from 5/5 showed partial response  -She is tolerating treatment well with no consistent major side effects. Labs reviewed and adequate to proceed with C7 Enhertu today. Will continue every 3 weeks.  -F/u in 3 weeks.    2.Peripheral neuropathy, grade  2, Right Foot pain,Secondarytochemotherapy -Ipreviouslystropped Abraxane on 10/13/18. -She will also continue OTC Calcium and Vit D,OralB12, Gabapentin 300mg 3 tabsTID andCymbalta to 40mg , Tramadol every other night for right foot pain.Well controlled. 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 Anemia, Secondary to chemo -Continue monitoring, will consider blood transfusion if hemoglobin less than 8. -On Enhertu, her anemia has worsenedand moderate, but stable.  5.Goal of Care Discussion,Social support,  -She lives alone, has her sister and niece in [place]. Her daughter lives in a few hours away  -On Ativan PRN due to anxietyabout diagnosis and treatment -The patient understands the goal of care is palliative. -She is full code  6.Asthma -This is secondary to post nasal drip and her Acid reflux, per Dr. Valeta Harms. Will continuePrilosec2 tabs BID,allergymedicationand nasal  spray and inhalers.Her breathing has improved and adequate now.  -Continue tof/u with Dr Limmie Patricia Dr Collene Mares.  7. Syncope, HTN  -During week 1 of C2 Enhertu she notes episode of syncope. Since then, she has remained more hydrated and has not had another syncopal episode.  -Patient notes occasional upper body jerk recently. I discussed watching this closely at home and if continues to worsens will evaluate for seizure.   Plan: -Will check on [employer] supplement.  -will give handicap sticker  -Labs reviewed and adequate to proceed with C7 enhertu today  -lab, f/u and treatment in 3 weeks    No problem-specific Assessment & Plan notes found for this encounter.   No orders of the defined types were placed in this encounter.  All questions were answered. The patient knows to call the clinic with any problems, questions or concerns. No barriers to learning was detected. The total time spent in the appointment was 30 minutes.     [name], MD 12/12/2020    I, Joslyn Devon, am acting as scribe for [name], MD.   I have reviewed the above documentation for accuracy and completeness, and I agree with the above.

## 2020-12-12 ENCOUNTER — Other Ambulatory Visit: Payer: Self-pay

## 2020-12-12 ENCOUNTER — Inpatient Hospital Stay: Payer: Medicare Other

## 2020-12-12 ENCOUNTER — Inpatient Hospital Stay (HOSPITAL_BASED_OUTPATIENT_CLINIC_OR_DEPARTMENT_OTHER): Payer: Medicare Other | Admitting: Hematology

## 2020-12-12 ENCOUNTER — Encounter: Payer: Self-pay | Admitting: Hematology

## 2020-12-12 VITALS — BP 111/74 | HR 69 | Temp 96.6°F | Resp 19 | Ht 64.0 in | Wt 148.9 lb

## 2020-12-12 DIAGNOSIS — Z7189 Other specified counseling: Secondary | ICD-10-CM

## 2020-12-12 DIAGNOSIS — C50919 Malignant neoplasm of unspecified site of unspecified female breast: Secondary | ICD-10-CM

## 2020-12-12 DIAGNOSIS — Z95828 Presence of other vascular implants and grafts: Secondary | ICD-10-CM

## 2020-12-12 DIAGNOSIS — Z5112 Encounter for antineoplastic immunotherapy: Secondary | ICD-10-CM | POA: Diagnosis not present

## 2020-12-12 LAB — CBC WITH DIFFERENTIAL (CANCER CENTER ONLY)
Abs Immature Granulocytes: 0 10*3/uL (ref 0.00–0.07)
Basophils Absolute: 0 10*3/uL (ref 0.0–0.1)
Basophils Relative: 1 %
Eosinophils Absolute: 0.2 10*3/uL (ref 0.0–0.5)
Eosinophils Relative: 4 %
HCT: 28.2 % — ABNORMAL LOW (ref 36.0–46.0)
Hemoglobin: 9 g/dL — ABNORMAL LOW (ref 12.0–15.0)
Immature Granulocytes: 0 %
Lymphocytes Relative: 36 %
Lymphs Abs: 1.7 10*3/uL (ref 0.7–4.0)
MCH: 29.8 pg (ref 26.0–34.0)
MCHC: 31.9 g/dL (ref 30.0–36.0)
MCV: 93.4 fL (ref 80.0–100.0)
Monocytes Absolute: 0.3 10*3/uL (ref 0.1–1.0)
Monocytes Relative: 7 %
Neutro Abs: 2.4 10*3/uL (ref 1.7–7.7)
Neutrophils Relative %: 52 %
Platelet Count: 205 10*3/uL (ref 150–400)
RBC: 3.02 MIL/uL — ABNORMAL LOW (ref 3.87–5.11)
RDW: 15.1 % (ref 11.5–15.5)
WBC Count: 4.6 10*3/uL (ref 4.0–10.5)
nRBC: 0 % (ref 0.0–0.2)

## 2020-12-12 LAB — CMP (CANCER CENTER ONLY)
ALT: 15 U/L (ref 0–44)
AST: 30 U/L (ref 15–41)
Albumin: 3.3 g/dL — ABNORMAL LOW (ref 3.5–5.0)
Alkaline Phosphatase: 127 U/L — ABNORMAL HIGH (ref 38–126)
Anion gap: 10 (ref 5–15)
BUN: 18 mg/dL (ref 8–23)
CO2: 24 mmol/L (ref 22–32)
Calcium: 9.8 mg/dL (ref 8.9–10.3)
Chloride: 104 mmol/L (ref 98–111)
Creatinine: 1.36 mg/dL — ABNORMAL HIGH (ref 0.44–1.00)
GFR, Estimated: 43 mL/min — ABNORMAL LOW
Glucose, Bld: 96 mg/dL (ref 70–99)
Potassium: 4.5 mmol/L (ref 3.5–5.1)
Sodium: 138 mmol/L (ref 135–145)
Total Bilirubin: 0.3 mg/dL (ref 0.3–1.2)
Total Protein: 6.4 g/dL — ABNORMAL LOW (ref 6.5–8.1)

## 2020-12-12 MED ORDER — ACETAMINOPHEN 325 MG PO TABS
650.0000 mg | ORAL_TABLET | Freq: Once | ORAL | Status: AC
  Administered 2020-12-12: 650 mg via ORAL

## 2020-12-12 MED ORDER — ACETAMINOPHEN 325 MG PO TABS
ORAL_TABLET | ORAL | Status: AC
  Filled 2020-12-12: qty 2

## 2020-12-12 MED ORDER — DIPHENHYDRAMINE HCL 25 MG PO CAPS
ORAL_CAPSULE | ORAL | Status: AC
  Filled 2020-12-12: qty 2

## 2020-12-12 MED ORDER — PALONOSETRON HCL INJECTION 0.25 MG/5ML
INTRAVENOUS | Status: AC
  Filled 2020-12-12: qty 5

## 2020-12-12 MED ORDER — SODIUM CHLORIDE 0.9 % IV SOLN
10.0000 mg | Freq: Once | INTRAVENOUS | Status: AC
  Administered 2020-12-12: 10 mg via INTRAVENOUS
  Filled 2020-12-12: qty 10

## 2020-12-12 MED ORDER — DEXTROSE 5 % IV SOLN
Freq: Once | INTRAVENOUS | Status: AC
  Filled 2020-12-12: qty 250

## 2020-12-12 MED ORDER — SODIUM CHLORIDE 0.9% FLUSH
10.0000 mL | INTRAVENOUS | Status: DC | PRN
  Administered 2020-12-12: 10 mL
  Filled 2020-12-12: qty 10

## 2020-12-12 MED ORDER — FAM-TRASTUZUMAB DERUXTECAN-NXKI CHEMO 100 MG IV SOLR
4.5000 mg/kg | Freq: Once | INTRAVENOUS | Status: AC
  Administered 2020-12-12: 300 mg via INTRAVENOUS
  Filled 2020-12-12: qty 15

## 2020-12-12 MED ORDER — PALONOSETRON HCL INJECTION 0.25 MG/5ML
0.2500 mg | Freq: Once | INTRAVENOUS | Status: AC
  Administered 2020-12-12: 0.25 mg via INTRAVENOUS

## 2020-12-12 MED ORDER — DIPHENHYDRAMINE HCL 25 MG PO CAPS
50.0000 mg | ORAL_CAPSULE | Freq: Once | ORAL | Status: AC
  Administered 2020-12-12: 50 mg via ORAL

## 2020-12-12 MED ORDER — HEPARIN SOD (PORK) LOCK FLUSH 100 UNIT/ML IV SOLN
500.0000 [IU] | Freq: Once | INTRAVENOUS | Status: AC | PRN
  Administered 2020-12-12: 500 [IU]
  Filled 2020-12-12: qty 5

## 2020-12-12 NOTE — Progress Notes (Signed)
6 month temporary handicap placard from completed for patient.

## 2020-12-12 NOTE — Patient Instructions (Signed)
[facility]  Discharge Instructions: Thank you for choosing [facility] to provide your oncology and hematology care.   If you have a lab appointment with the [facility], please go directly to the [facility] and check in at the registration area.   Wear comfortable clothing and clothing appropriate for easy access to any Portacath or PICC line.   We strive to give you quality time with your provider. You may need to reschedule your appointment if you arrive late (15 or more minutes).  Arriving late affects you and other patients whose appointments are after yours.  Also, if you miss three or more appointments without notifying the office, you may be dismissed from the clinic at the provider's discretion.      For prescription refill requests, have your pharmacy contact our office and allow 72 hours for refills to be completed.    Today you received the following chemotherapy and/or immunotherapy agents: enhertu.       To help prevent nausea and vomiting after your treatment, we encourage you to take your nausea medication as directed.  BELOW ARE SYMPTOMS THAT SHOULD BE REPORTED IMMEDIATELY: . *FEVER GREATER THAN 100.4 F (38 C) OR HIGHER . *CHILLS OR SWEATING . *NAUSEA AND VOMITING THAT IS NOT CONTROLLED WITH YOUR NAUSEA MEDICATION . *UNUSUAL SHORTNESS OF BREATH . *UNUSUAL BRUISING OR BLEEDING . *URINARY PROBLEMS (pain or burning when urinating, or frequent urination) . *BOWEL PROBLEMS (unusual diarrhea, constipation, pain near the anus) . TENDERNESS IN MOUTH AND THROAT WITH OR WITHOUT PRESENCE OF ULCERS (sore throat, sores in mouth, or a toothache) . UNUSUAL RASH, SWELLING OR PAIN  . UNUSUAL VAGINAL DISCHARGE OR ITCHING   Items with * indicate a potential emergency and should be followed up as soon as possible or go to the Emergency Department if any problems should occur.  Please show the CHEMOTHERAPY ALERT CARD or IMMUNOTHERAPY ALERT  CARD at check-in to the Emergency Department and triage nurse.  Should you have questions after your visit or need to cancel or reschedule your appointment, please contact [facility]  Dept: [phone]  and follow the prompts.  Office hours are 8:00 a.m. to 4:30 p.m. Monday - Friday. Please note that voicemails left after 4:00 p.m. may not be returned until the following business day.  We are closed weekends and major holidays. You have access to a nurse at all times for urgent questions. Please call the main number to the clinic Dept: [phone] and follow the prompts.   For any non-urgent questions, you may also contact your provider using MyChart. We now offer e-Visits for anyone 53 and older to request care online for non-urgent symptoms. For details visit [email or web address].   Also download the MyChart app! Go to the app store, search "MyChart", open the app, select [facility], and log in with your MyChart username and password.  Due to Covid, a mask is required upon entering the hospital/clinic. If you do not have a mask, one will be given to you upon arrival. For doctor visits, patients may have 1 support person aged 57 or older with them. For treatment visits, patients cannot have anyone with them due to current Covid guidelines and our immunocompromised population.

## 2020-12-12 NOTE — Progress Notes (Signed)
Pt declined to stay for 30 min observation, instead only stayed for 15. Pt left, stable at discharge, ambulatory to lobby with no signs of distress.

## 2020-12-13 LAB — CANCER ANTIGEN 27.29: CA 27.29: 87.8 U/mL — ABNORMAL HIGH (ref 0.0–38.6)

## 2020-12-16 ENCOUNTER — Telehealth: Payer: Self-pay | Admitting: Hematology

## 2020-12-16 NOTE — Telephone Encounter (Signed)
Scheduled follow-up appointments per 5/27 los. Patient is aware.

## 2020-12-17 ENCOUNTER — Telehealth: Payer: Self-pay

## 2020-12-17 NOTE — Telephone Encounter (Signed)
-----   Message from [name], MD sent at 12/15/2020 11:29 PM EDT ----- Thanks much Ginna.   Santiago Glad, please let pt know the message below and I suggest her not taking sea moss.   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 Theoretically, carrageenan may increase the risk of bleeding w/ the Xarelto and if pltc drops on chemo.  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  Thanks   Krista Blue

## 2020-12-17 NOTE — Telephone Encounter (Signed)
I relayed this message to Ms Karrer.  She verbalized understanding.

## 2020-12-20 MED FILL — Budesonide-Glycopyrrolate-Formoterol Aers 160-9-4.8 MCG/ACT: RESPIRATORY_TRACT | 30 days supply | Qty: 10.7 | Fill #1 | Status: AC

## 2020-12-20 MED FILL — Potassium Chloride Microencapsulated Crys ER Tab 20 mEq: ORAL | 30 days supply | Qty: 30 | Fill #2 | Status: AC

## 2020-12-22 ENCOUNTER — Other Ambulatory Visit (HOSPITAL_COMMUNITY): Payer: Self-pay

## 2020-12-26 ENCOUNTER — Ambulatory Visit (HOSPITAL_COMMUNITY)
Admission: RE | Admit: 2020-12-26 | Discharge: 2020-12-26 | Disposition: A | Discharge status: Home / Self Care | Payer: Medicare Other | Source: Ambulatory Visit | Attending: Internal Medicine | Admitting: Internal Medicine

## 2020-12-26 ENCOUNTER — Ambulatory Visit (HOSPITAL_BASED_OUTPATIENT_CLINIC_OR_DEPARTMENT_OTHER)
Admission: RE | Admit: 2020-12-26 | Discharge: 2020-12-26 | Disposition: A | Discharge status: Home / Self Care | Payer: Medicare Other | Source: Ambulatory Visit | Attending: Internal Medicine | Admitting: Internal Medicine

## 2020-12-26 ENCOUNTER — Other Ambulatory Visit: Payer: Self-pay

## 2020-12-26 ENCOUNTER — Encounter (HOSPITAL_COMMUNITY): Payer: Self-pay | Admitting: Internal Medicine

## 2020-12-26 VITALS — BP 118/90 | HR 67 | Ht 64.0 in | Wt 147.8 lb

## 2020-12-26 DIAGNOSIS — C50919 Malignant neoplasm of unspecified site of unspecified female breast: Secondary | ICD-10-CM

## 2020-12-26 DIAGNOSIS — Z79811 Long term (current) use of aromatase inhibitors: Secondary | ICD-10-CM | POA: Diagnosis not present

## 2020-12-26 DIAGNOSIS — I427 Cardiomyopathy due to drug and external agent: Secondary | ICD-10-CM

## 2020-12-26 DIAGNOSIS — I110 Hypertensive heart disease with heart failure: Secondary | ICD-10-CM | POA: Insufficient documentation

## 2020-12-26 DIAGNOSIS — I10 Essential (primary) hypertension: Secondary | ICD-10-CM

## 2020-12-26 DIAGNOSIS — I509 Heart failure, unspecified: Secondary | ICD-10-CM | POA: Diagnosis not present

## 2020-12-26 DIAGNOSIS — T451X5A Adverse effect of antineoplastic and immunosuppressive drugs, initial encounter: Secondary | ICD-10-CM

## 2020-12-26 DIAGNOSIS — Z79899 Other long term (current) drug therapy: Secondary | ICD-10-CM | POA: Insufficient documentation

## 2020-12-26 DIAGNOSIS — K219 Gastro-esophageal reflux disease without esophagitis: Secondary | ICD-10-CM | POA: Diagnosis not present

## 2020-12-26 DIAGNOSIS — Z0189 Encounter for other specified special examinations: Secondary | ICD-10-CM

## 2020-12-26 DIAGNOSIS — Z170 Estrogen receptor positive status [ER+]: Secondary | ICD-10-CM | POA: Diagnosis not present

## 2020-12-26 DIAGNOSIS — I82621 Acute embolism and thrombosis of deep veins of right upper extremity: Secondary | ICD-10-CM | POA: Diagnosis not present

## 2020-12-26 DIAGNOSIS — Z7901 Long term (current) use of anticoagulants: Secondary | ICD-10-CM | POA: Diagnosis not present

## 2020-12-26 LAB — ECHOCARDIOGRAM COMPLETE
Area-P 1/2: 2.66 cm2
S' Lateral: 2.3 cm

## 2020-12-26 NOTE — Progress Notes (Signed)
Cardio-Oncology Clinic Note    Date:  12/26/2020   ID:  Teresa Hays, Teresa Hays 1956/06/19, MRN [id number]  Location: Home  Provider location: [facility] Type of Visit: New patient  PCP:  [name], DO  Cardiologist:  None  Referring: Dr. Burr Medico   History of Present Illness:  Teresa Hays is a 65 y/o woman with GERD, HTN and metastatic breast CA referred by Dr. Burr Medico for enrollment into the Cardio-Oncology program.   SUMMARY OF ONCOLOGIC HISTORY:     Oncology History    Cancer Staging Metastatic breast cancer [facility]) Staging form: Breast, AJCC 8th Edition - Clinical stage from 03/24/2018: Stage IV (cT2, cN1, pM1, G3, ER+, PR+, HER2+) - Signed by [name], MD on 03/30/2018           Metastatic breast cancer ([facility])    01/30/2018 Procedure      Colonoscopy showed small polyp in the sigmoid colon, removed, the exam of colon including the terminal ileum was otherwise negative.      01/30/2018 Procedure      EGD by Dr. Collene Mares showed small hiatal hernia, a 8 mm polypoid lesion in the cardia, biopsied.      03/09/2018 Imaging      03/09/2018 US Abdomen IMPRESSION: 1. Mass lesions throughout the liver, consistent with metastatic disease. Liver as a somewhat nodular contour suggesting underlying hepatic cirrhosis. Inhomogeneous echotexture to the liver.   2. Cholelithiasis with mild gallbladder wall thickening. A degree of cholecystitis cannot be excluded by ultrasound.   3. Portions of pancreas obscured by gas. Visualized portions of pancreas appear normal.   4. Small right kidney. Etiology uncertain. This finding potentially may be indicative of renal artery stenosis. In this regard, question whether patient is hypertensive.      03/15/2018 Imaging      CT CAP with contrast  IMPRESSION: 1. Widespread hepatic metastasis. 2. 2.6 cm lateral right breast soft tissue nodule could represent a breast primary or an incidental benign lesion. Consider  correlation with mammogram and ultrasound. 3. No definite source of primary malignancy identified within the abdomen or pelvis. There is possible rectosigmoid junction wall thickening. Consider colonoscopy with attention to this area. 4. Distal esophageal wall thickening, suggesting esophagitis.      03/24/2018 Cancer Staging      Staging form: Breast, AJCC 8th Edition - Clinical stage from 03/24/2018: Stage IV (cT2, cN1, pM1, G3, ER+, PR+, HER2+) - Signed by [name], MD on 03/30/2018      03/24/2018 Initial Biopsy      Diagnosis 1. 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 Breast, right, needle core biopsy, 9 o'clock, 7cm from nipple - INVASIVE DUCTAL CARCINOMA. -The carcinoma is somewhat morphologically dissimilar from that in part 1. It appears grade III 3. Lymph node, needle/core biopsy, right axillary - METASTATIC CARCINOMA IN 1 OF 1 LYMPH NODE (1/1).      03/24/2018 Receptors her2      Breast biopsy: 1. Estrogen Receptor: 40%, POSITIVE, STRONG-MODERATE STAINING INTENSITY Progesterone Receptor: 70%, POSITIVE, STRONG STAINING INTENSITY Proliferation Marker Ki67: 20% HER 2 equivocal by IHC 2+, POSITIVE by FISH, ratio 2.4 and copy #4.2   2. Estrogen Receptor: 60%, POSITIVE, MODERATE STAINING INTENSITY Progesterone Receptor: 40%, POSITIVE, MODERATE STAINING INTENSITY Proliferation Marker Ki67: 20% HER2 (+) by IHC 3+      03/24/2018 Initial Diagnosis  Metastatic breast cancer (H[facility])      03/27/2018 Pathology Results      Diagnosis Liver, needle/core biopsy, Right - METASTATIC CARCINOMA TO LIVER, CONSISTENT WITH PATIENTS CLINICAL HISTORY OF PRIMARY BREAST CARCINOMA.   ER 80%+ PR40%+ HER2- (by [facility], IHC 2+)  Ki67 50%       03/28/2018 Pathology Results      03/28/2018 Surgical Pathology Diagnosis 1. Breast, left, needle core biopsy, 9 o'clock - FIBROCYSTIC CHANGES. - THERE IS NO EVIDENCE  OF MALIGNANCY. 2. Breast, left, needle core biopsy, 2 o'clock - FIBROADENOMA. - THERE IS NO EVIDENCE OF MALIGNANCY. - SEE COMMENT.      03/29/2018 Imaging      03/29/2018 Bone Scan IMPRESSION: No scintigraphic evidence of osseous metastatic disease.      03/30/2018 Imaging      Bone scan  IMPRESSION: No scintigraphic evidence of osseous metastatic disease.       04/07/2018 -  Chemotherapy      First line chemo weekly Taxol and herceptin/Perjeta every 3 weeks starting 04/07/18. She developed infusion reaction to taxol and it was discontinued. Added Abraxane on C1D8, 2 weeks on/1 week off.  Abraxne stopped after 10/13/18 due to worsening Neuropathy      06/06/2018 Imaging      CT CAP IMPRESSION: 1. Generally improved appearance, with reduced axillary adenopathy and reduced enhancing component of the hepatic masses, with some of the hepatic mass is moderately smaller than on the prior exam. Reduced size of the right lateral breast mass compared to the prior 03/15/2018 exam. 2. New mild interstitial accentuation in the lungs, significance uncertain. Part of this appearance may be due to lower lung volumes on today's exam. 3. Mild wall thickening in the descending colon and upper rectum suggesting low-grade colitis/inflammation. Prominent stool throughout the colon favors constipation. 4. Other imaging findings of potential clinical significance: Aortic Atherosclerosis (ICD10-I70.0). Mild cardiomegaly. Mild nodularity in the right lower lobe appears stable. Contracted and thick-walled gallbladder.        09/18/2018 Imaging      CT CAP W Contrast 09/18/18  IMPRESSION: 1. Liver metastases have decreased in size. 2. Right breast mass, mild right axillary lymphadenopathy and scattered tiny right pulmonary nodules are all stable. 3. New mild left supraclavicular and left subpectoral lymphadenopathy, can not exclude progression of metastatic nodal disease. 4. Moderate colorectal stool volume, which  may indicate constipation. 5.  Aortic Atherosclerosis (ICD10-I70.0).      10/2018 -  Anti-estrogen oral therapy      Letrozole 2.5 mg daily starting 10/2018        She presents today for routine f/u. Feeling ok. Now back on Enhertu. Tolerating well has had 4-5 doses without problems. Denies SOB, orthopnea or PND. BP has dropped down to about 110 but no symptoms.   Echo today EF 60-65% G1 DD Personally reviewed   Echo 04/23/20 04/23/20 EF 50-55%  Echo 5/21 EF 50-55% GLS -15.0%  Echo 2/21 EF 55-60% GLS - 15.8%  Echo 11/20 EF 55% GLS -17.9%  Echo 9/20 EF 50-55% GLS - 14.4% Echo 02/27/19 EF 50% GLS -21.4%  Echo 07/28/18 EF 60-65% GLS -19.2% Echo 12/1018  EF 55-60% GLS -16.1%     Past Medical History:  Diagnosis Date   GERD (gastroesophageal reflux disease)    Hypertension    Personal history of chemotherapy    rt breast ca with mets to liver dx'd 03/2018   Past Surgical History:  Procedure Laterality Date   BREAST BIOPSY Right 03/24/2018  CA x3   BREAST BIOPSY Left 03/28/2018   neg   BREAST BIOPSY Left 03/30/2018   neg   COLONOSCOPY     ESOPHAGOGASTRODUODENOSCOPY ENDOSCOPY     IR IMAGING GUIDED PORT INSERTION  04/04/2018     Current Outpatient Medications  Medication Sig Dispense Refill   amLODipine (NORVASC) 5 MG tablet TAKE 1 TABLET BY MOUTH ONCE DAILY 30 tablet 2   Budeson-Glycopyrrol-Formoterol 160-9-4.8 MCG/ACT AERO INHALE 2 PUFFS BY MOUTH INTO THE LUNGS IN THE MORNING AND AT BEDTIME. 10.7 g 6   carvedilol (COREG) 3.125 MG tablet TAKE 1 TABLET BY MOUTH TWICE DAILY. REPLACES ATENOLOL 180 tablet 3   DULoxetine (CYMBALTA) 20 MG capsule TAKE 2 CAPSULES BY MOUTH DAILY 60 capsule 3   gabapentin (NEURONTIN) 300 MG capsule MAY TAKE UP TO 3 CAPSULES THREE TIMES DAILY BY MOUTH 270 capsule 3   lidocaine (XYLOCAINE) 2 % jelly APPLY OVER PORT A CATH TOPICALLY 1 HOUR BEFORE PORT BEING ACCESSED AS NEEDED 30 mL 2   losartan (COZAAR) 50 MG tablet Take 2 tablets (100 mg total) by mouth  daily. 180 tablet 3   meclizine (ANTIVERT) 25 MG tablet TAKE 1/2 TABLET BY MOUTH 3 TIMES DAILY AS NEEDED FOR DIZZINESS 15 tablet 2   montelukast (SINGULAIR) 10 MG tablet TAKE 1 TABLET (10 MG TOTAL) BY MOUTH AT BEDTIME. 30 tablet 5   omeprazole (PRILOSEC) 40 MG capsule TAKE 1 CAPSULE BY MOUTH 2 TIMES DAILY 60 capsule 3   ondansetron (ZOFRAN) 8 MG tablet TAKE 1 TABLET BY MOUTH EVERY 8 HOURS AS NEEDED FOR NAUSEA OR VOMITING 30 tablet 3   potassium chloride SA (KLOR-CON) 20 MEQ tablet TAKE 1 TABLET BY MOUTH ONCE A DAY 30 tablet 3   prochlorperazine (COMPAZINE) 10 MG tablet TAKE 1 TABLET BY MOUTH EVERY 6 HOURS AS NEEDED FOR NAUSEA OR VOMITING 30 tablet 3   rivaroxaban (XARELTO) 20 MG TABS tablet TAKE 1 TABLET BY MOUTH DAILY WITH SUPPER 30 tablet 5   spironolactone (ALDACTONE) 25 MG tablet TAKE 1 TABLET BY MOUTH ONCE DAILY 90 tablet 3   Zoster Vaccine Adjuvanted (SHINGRIX) injection INJECT 0.5 MLS INTO THE MUSCLE ONCE FOR 1 DOSE. 1 each 1   No current facility-administered medications for this encounter.   Facility-Administered Medications Ordered in Other Encounters  Medication Dose Route Frequency Provider Last Rate Last Admin   sodium chloride flush (NS) 0.9 % injection 10 mL  10 mL Intracatheter PRN [name], MD   10 mL at 05/28/20 0935    Allergies:   Patient has no known allergies.   Social History:  The patient  reports that she has never smoked. She has never used smokeless tobacco. She reports that she does not drink alcohol and does not use drugs.   Family History:  The patient's family history includes Breast cancer in her sister; Cancer (age of onset: 58) in her sister; Diabetes in her mother; Rheum arthritis in her sister.   ROS:  Please see the history of present illness.   All other systems are personally reviewed and negative.   Vitals:   12/26/20 1106  BP: 118/90  Pulse: 67  SpO2: 100%  Weight: 67 kg (147 lb 12.8 oz)  Height: $Remove'5\' 4"'ahjWCoL$  (1.626 m)    Exam:   General:  Well  appearing. No resp difficulty HEENT: normal Neck: supple. no JVD. Carotids 2+ bilat; no bruits. No lymphadenopathy or thryomegaly appreciated. Cor: PMI nondisplaced. Regular rate & rhythm. No rubs, gallops or murmurs. Lungs: clear  Abdomen: soft, nontender, nondistended. No hepatosplenomegaly. No bruits or masses. Good bowel sounds. Extremities: no cyanosis, clubbing, rash, edema Neuro: alert & orientedx3, cranial nerves grossly intact. moves all 4 extremities w/o difficulty. Affect pleasant   Recent Labs: 09/05/2020: Magnesium 1.8 12/12/2020: ALT 15; BUN 18; Creatinine 1.36; Hemoglobin 9.0; Platelet Count 205; Potassium 4.5; Sodium 138  Personally reviewed   Wt Readings from Last 3 Encounters:  12/26/20 67 kg (147 lb 12.8 oz)  12/12/20 67.5 kg (148 lb 14.4 oz)  11/21/20 68 kg (150 lb)      ASSESSMENT AND PLAN:  1. Breast cancer, metastatic - She has underwent first-line weekly Taxol with Herceptin/Perjeta every 3 weeks starting 04/07/18. She developed infusion reaction to taxol and it was discontinued. Changed to Abraxane weekly 2 weeks on/1 week off from 07/21/2018.  Abraxane held after October 13, 2018 due to his worsening neuropathy. Herceptin/Perjeta switched to Enhertu in 1/22. Now on Letrozole 2.5 mg daily starting 10/2018. Switched back to Enhertu in 4/22 - Echo 8/20 EF down to 45-50% suspicious for mild Herceptin cardiotoxicity.  - Herceptin/perjeta held  for one dose.  - Echo 08/20/20 EF 55-60% Grade 1DD GLS -19.4% (switched to Enhertu 1/22) - Echo today 12/26/20 EF 60-65% G1 DD - Echo looks good today. - Continue carvedilol 3.125 bid - Continue losartan to 100 mg daily - Continue spiro 25 - If SBP drops < 100 can cut losartan in half - Now that she is back on Enhertu will repeat echo in 3 months to ensure stability. If stable, switch echos to every 6 months.   2. HTN  - Blood pressure well controlled. Continue current regimen. - Plan as above  3. RUE DVT - continue Xarelto.  No bleeding   Glori Bickers, MD  12/26/2020 11:24 AM  [facility] [place] [address] [phone] (office) [phone] (fax)

## 2020-12-26 NOTE — Patient Instructions (Signed)
Good to see you today! No medication changes  Your physician has requested that you have an echocardiogram. Echocardiography is a painless test that uses sound waves to create images of your heart. It provides your doctor with information about the size and shape of your heart and how well your heart's chambers and valves are working. This procedure takes approximately one hour. There are no restrictions for this procedure.  Your physician recommends that you schedule a follow-up appointment in: 3 months with an echocardiogram  If you have any questions or concerns before your next appointment please send [place] a message through [place] or call our office at [phone].    TO LEAVE A MESSAGE FOR THE NURSE SELECT OPTION 2, PLEASE LEAVE A MESSAGE INCLUDING: YOUR NAME DATE OF BIRTH CALL BACK NUMBER REASON FOR CALL**this is important as we prioritize the call backs  YOU WILL RECEIVE A CALL BACK THE SAME DAY AS LONG AS YOU CALL BEFORE 4:00 PM  At the [facility], you and your health needs are our priority. As part of our continuing mission to provide you with exceptional heart care, we have created designated Provider Care Teams. These Care Teams include your primary Cardiologist (physician) and Advanced Practice Providers (APPs- Physician Assistants and Nurse Practitioners) who all work together to provide you with the care you need, when you need it.   You may see any of the following providers on your designated Care Team at your next follow up: Dr Glori Bickers Dr Loralie Champagne Dr Patrice Paradise, NP Lyda Jester, [place] Ginnie Smart Audry Riles, PharmD   Please be sure to bring in all your medications bottles to every appointment.

## 2020-12-26 NOTE — Progress Notes (Signed)
  Echocardiogram 2D Echocardiogram has been performed.  Teresa Hays 12/26/2020, 10:52 AM

## 2020-12-26 NOTE — Addendum Note (Signed)
Encounter addended by: Stanford Scotland, RN on: 12/26/2020 11:50 AM  Actions taken: Visit diagnoses modified, Order list changed, Diagnosis association updated

## 2020-12-26 NOTE — Addendum Note (Signed)
Encounter addended by: Stanford Scotland, RN on: 12/26/2020 11:45 AM  Actions taken: Clinical Note Signed

## 2020-12-27 ENCOUNTER — Other Ambulatory Visit: Payer: Self-pay | Admitting: Hematology

## 2020-12-28 ENCOUNTER — Encounter: Payer: Self-pay | Admitting: Hematology

## 2020-12-29 ENCOUNTER — Other Ambulatory Visit: Payer: Self-pay | Admitting: Hematology

## 2020-12-29 ENCOUNTER — Other Ambulatory Visit (HOSPITAL_COMMUNITY): Payer: Self-pay

## 2020-12-29 MED ORDER — TRAMADOL HCL 50 MG PO TABS
50.0000 mg | ORAL_TABLET | Freq: Four times a day (QID) | ORAL | 0 refills | Status: DC | PRN
  Filled 2020-12-29: qty 30, 8d supply, fill #0

## 2020-12-29 NOTE — Progress Notes (Signed)
Teresa Hays   [phone] [phone]   Clinic Follow up Note   Patient Care Team: [name], DO as PCP - General (Family Medicine) [name], MD as Consulting Physician (Gastroenterology) [name], MD as Consulting Physician (Hematology)  Date of Service:  01/02/2021  CHIEF COMPLAINT: F/u for metastatic breast cancer  SUMMARY OF ONCOLOGIC HISTORY: Oncology History Overview Note  Cancer Staging Metastatic breast cancer [facility]) Staging form: Breast, AJCC 8th Edition - Clinical stage from 03/24/2018: Stage IV (cT2, cN1, pM1, G3, ER+, PR+, HER2+) - Signed by [name], MD on 03/30/2018     Metastatic breast cancer ([place])  01/30/2018 Procedure   Colonoscopy showed small polyp in the sigmoid colon, removed, the exam of colon including the terminal ileum was otherwise negative.    01/30/2018 Procedure   EGD by Dr. Collene Mares showed small hiatal hernia, a 8 mm polypoid lesion in the cardia, biopsied.    03/09/2018 Imaging   03/09/2018 US Abdomen IMPRESSION: 1. Mass lesions throughout the liver, consistent with metastatic disease. Liver as a somewhat nodular contour suggesting underlying hepatic cirrhosis. Inhomogeneous echotexture to the liver.   2. Cholelithiasis with mild gallbladder wall thickening. A degree of cholecystitis cannot be excluded by ultrasound.   3. Portions of pancreas obscured by gas. Visualized portions of pancreas appear normal.   4. Small right kidney. Etiology uncertain. This finding potentially may be indicative of renal artery stenosis. In this regard, question whether patient is hypertensive.   03/15/2018 Imaging   CT CAP with contrast  IMPRESSION: 1. Widespread hepatic metastasis. 2. 2.6 cm lateral right breast soft tissue nodule could represent a breast primary or an incidental benign lesion. Consider correlation with mammogram and ultrasound. 3. No definite source of primary malignancy identified within the abdomen  or pelvis. There is possible rectosigmoid junction wall thickening. Consider colonoscopy with attention to this area. 4. Distal esophageal wall thickening, suggesting esophagitis.   03/24/2018 Cancer Staging   Staging form: Breast, AJCC 8th Edition - Clinical stage from 03/24/2018: Stage IV (cT2, cN1, pM1, G3, ER+, PR+, HER2+) - Signed by [name], MD on 03/30/2018    03/24/2018 Initial Biopsy   Diagnosis 1. 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 Breast, right, needle core biopsy, 9 o'clock, 7cm from nipple - INVASIVE DUCTAL CARCINOMA. -The carcinoma is somewhat morphologically dissimilar from that in part 1. It appears grade III 3. Lymph node, needle/core biopsy, right axillary - METASTATIC CARCINOMA IN 1 OF 1 LYMPH NODE (1/1).    03/24/2018 Receptors her2   Breast biopsy: 1. Estrogen Receptor: 40%, POSITIVE, STRONG-MODERATE STAINING INTENSITY Progesterone Receptor: 70%, POSITIVE, STRONG STAINING INTENSITY Proliferation Marker Ki67: 20% HER 2 equivocal by IHC 2+, POSITIVE by FISH, ratio 2.4 and copy #4.2  2. Estrogen Receptor: 60%, POSITIVE, MODERATE STAINING INTENSITY Progesterone Receptor: 40%, POSITIVE, MODERATE STAINING INTENSITY Proliferation Marker Ki67: 20% HER2 (+) by IHC 3+    03/24/2018 Initial Diagnosis   Metastatic breast cancer ([place])    03/27/2018 Pathology Results   Diagnosis Liver, needle/core biopsy, Right - METASTATIC CARCINOMA TO LIVER, CONSISTENT WITH PATIENTS CLINICAL HISTORY OF PRIMARY BREAST CARCINOMA.  ER 80%+ PR40%+ HER2- (by [facility], IHC 2+)  Ki67 50%     03/28/2018 Pathology Results   03/28/2018 Surgical Pathology Diagnosis 1. Breast, left, needle core biopsy, 9 o'clock - FIBROCYSTIC CHANGES. - THERE IS NO EVIDENCE OF MALIGNANCY. 2. Breast, left, needle core biopsy,  2 o'clock - FIBROADENOMA. - THERE IS NO EVIDENCE OF MALIGNANCY. - SEE COMMENT.     03/29/2018 Imaging   03/29/2018 Bone Scan IMPRESSION: No scintigraphic evidence of osseous metastatic disease.   03/30/2018 Imaging   Bone scan  IMPRESSION: No scintigraphic evidence of osseous metastatic disease.     04/07/2018 - 07/30/2020 Chemotherapy   First line chemo weekly Taxol and herceptin/Perjeta every 3 weeks starting 04/07/18. She developed infusion reaction to taxol and it was discontinued. Added Abraxane on C1D8, 2 weeks on/1 week off.  Abraxne stopped after 10/13/18 due to worsening Neuropathy. She has continued with maintenance Herceptin injection/Perjeta q3weeks. Herceptin changed to injection on 04/06/19. Both injections switched to combination [place] on 10/01/19. ----Stopped 07/30/20 due to disease progression in liver.    06/06/2018 Imaging   CT CAP IMPRESSION: 1. Generally improved appearance, with reduced axillary adenopathy and reduced enhancing component of the hepatic masses, with some of the hepatic mass is moderately smaller than on the prior exam. Reduced size of the right lateral breast mass compared to the prior 03/15/2018 exam. 2. New mild interstitial accentuation in the lungs, significance uncertain. Part of this appearance may be due to lower lung volumes on today's exam. 3. Mild wall thickening in the descending colon and upper rectum suggesting low-grade colitis/inflammation. Prominent stool throughout the colon favors constipation. 4. Other imaging findings of potential clinical significance: Aortic Atherosclerosis (ICD10-I70.0). Mild cardiomegaly. Mild nodularity in the right lower lobe appears stable. Contracted and thick-walled gallbladder.      09/18/2018 Imaging   CT CAP W Contrast 09/18/18  IMPRESSION: 1. Liver metastases have decreased in size. 2. Right breast mass, mild right axillary lymphadenopathy and scattered tiny right pulmonary nodules are all stable. 3. New mild left supraclavicular and left subpectoral lymphadenopathy, can not exclude  progression of metastatic nodal disease. 4. Moderate colorectal stool volume, which may indicate constipation. 5.  Aortic Atherosclerosis (ICD10-I70.0).    10/2018 - 07/30/2020 Anti-estrogen oral therapy   Letrozole 2.5 mg daily starting 10/2018 D/c to proceed with Inhertu treatment after liver met progression.    01/10/2019 Imaging   CT CAP W Contrast 01/10/19  IMPRESSION: 1. Continued improvement in the hepatic metastatic lesions which have reduced in size. 2. Stable mild left supraclavicular and subpectoral adenopathy. 3. Essentially stable small right lower lobe pulmonary nodule and separate small subpleural nodule along the right hemidiaphragm. Surveillance suggested. 4. Other imaging findings of potential clinical significance: Mild cardiomegaly. Mild circumferential distal esophageal wall thickening, the most common cause would be esophagitis. Airway thickening is present, suggesting bronchitis or reactive airways disease. Airway plugging in the lower lobes and in the right middle lobe. Stable small right adrenal adenoma.   05/15/2019 Imaging   CT CAP W Contrast 05/15/19  IMPRESSION: CT CHEST IMPRESSION   1. Similar appearance of borderline supraclavicular, axillary, and subpectoral adenopathy. 2. Improved and resolved right lower lobe pulmonary nodularity. 3. Esophageal air fluid level suggests dysmotility or gastroesophageal reflux.   CT ABDOMEN AND PELVIS IMPRESSION   1. Improved hepatic metastasis. 2. Small bowel mesenteric lymph nodes which are upper normal and mildly enlarged. Likely increased and similar as detailed above. Indeterminate. Recommend attention on follow-up. 3. Cholelithiasis. 4. Motion degradation throughout the lower chest and abdomen.   09/19/2019 Imaging   CT CAP W contrast  IMPRESSION: 1. Interval decrease in size of right axillary and subpectoral lymph nodes. There are no pathologically enlarged lymph nodes remaining in the chest, abdomen,  or pelvis. No new lymphadenopathy. 2. No significant  change in post treatment appearance of multiple low-attenuation liver lesions, in keeping with treated metastases. 3. No evidence of new metastatic disease in the chest, abdomen, or pelvis. 4. Aortic Atherosclerosis (ICD10-I70.0).   11/16/2019 Imaging   IMRI Brain  MPRESSION: Minimal chronic microvascular ischemic changes. No acute intracranial process.   Active bilateral maxillary sinus disease with mild frontoethmoid mucosal thickening.   01/23/2020 Imaging   CT CAP W contrast  IMPRESSION: 1. Stable exam. No new or progressive interval findings. 2. Stable appearance of upper normal right axillary lymph nodes. Tiny subpectoral and left axillary nodes are unchanged. 3. Generally similar appearance of ill-defined hypoattenuating lesions in the liver compatible with treated metastases. 1 lesion in the central liver is minimally more conspicuous today, likely related to bolus timing and attention on follow-up recommended. No new suspicious liver lesion on today's study. 4. Stable 10 mm right adrenal nodule., indeterminate. Continued attention on follow-up imaging recommended. 5. Cholelithiasis. 6. Aortic Atherosclerosis (ICD10-I70.0).   02/11/2020 Breast MRI   IMPRESSION: 1. 7 millimeter focus of residual enhancement associated with the known malignancy in the 9 o'clock location of the RIGHT breast. 2. No significant enhancement in the known malignancy in the 11:30 o'clock location of the RIGHT breast. 3. Interval resolution of axillary adenopathy.   07/28/2020 Imaging   CT CAP  IMPRESSION: 1. Interval progression of hepatic metastases. 2. Stable indeterminate right adrenal nodule. 3. No new sites of metastatic disease in the chest, abdomen or pelvis. 4. Chronic findings include: Small hiatal hernia. Cholelithiasis. Aortic Atherosclerosis (ICD10-I70.0).     08/08/2020 -  Chemotherapy   Second-line Inhertu q3weeks starting  08/08/20       CURRENT THERAPY:  Second-line Enhertu q3weeks starting 08/08/20. Due to poor toleration with C1, she required dose reduction from C2.   INTERVAL HISTORY:  Teresa Hays is here for a follow up. She was last seen by me 12/12/20. She presents to the clinic alone. She notes she is doing well. In the last 3 weeks she notes having a sharp posterior scalp pain. She will have to take Tylenol. She notes the pain occurs every 3 days and will go away after Tylenol. Her pain has been 7-8/10. She notes if she touches her scalp it is tender.   She notes she saw Dr Jeffie Pollock on 12/26/20 and she is doing well.    REVIEW OF SYSTEMS:   Constitutional: Denies fevers, chills or abnormal weight loss (+) Posterior scalp/head pain 7-8/10 Eyes: Denies blurriness of vision Ears, nose, mouth, throat, and face: Denies mucositis or sore throat Respiratory: Denies cough, dyspnea or wheezes Cardiovascular: Denies palpitation, chest discomfort or lower extremity swelling Gastrointestinal:  Denies nausea, heartburn or change in bowel habits Skin: Denies abnormal skin rashes Lymphatics: Denies new lymphadenopathy or easy bruising Neurological:Denies numbness, tingling or new weaknesses Behavioral/Psych: Mood is stable, no new changes  All other systems were reviewed with the patient and are negative.  MEDICAL HISTORY:  Past Medical History:  Diagnosis Date   GERD (gastroesophageal reflux disease)    Hypertension    Personal history of chemotherapy    rt breast ca with mets to liver dx'd 03/2018    SURGICAL HISTORY: Past Surgical History:  Procedure Laterality Date   BREAST BIOPSY Right 03/24/2018   CA x3   BREAST BIOPSY Left 03/28/2018   neg   BREAST BIOPSY Left 03/30/2018   neg   COLONOSCOPY     ESOPHAGOGASTRODUODENOSCOPY ENDOSCOPY     IR IMAGING GUIDED PORT INSERTION  04/04/2018  I have reviewed the social history and family history with the patient and they are unchanged from  previous note.  ALLERGIES:  has No Known Allergies.  MEDICATIONS:  Current Outpatient Medications  Medication Sig Dispense Refill   amLODipine (NORVASC) 5 MG tablet TAKE 1 TABLET BY MOUTH ONCE DAILY 30 tablet 2   Budeson-Glycopyrrol-Formoterol 160-9-4.8 MCG/ACT AERO INHALE 2 PUFFS BY MOUTH INTO THE LUNGS IN THE MORNING AND AT BEDTIME. 10.7 g 6   carvedilol (COREG) 3.125 MG tablet TAKE 1 TABLET BY MOUTH TWICE DAILY. REPLACES ATENOLOL 180 tablet 3   DULoxetine (CYMBALTA) 20 MG capsule TAKE 2 CAPSULES BY MOUTH DAILY 60 capsule 3   gabapentin (NEURONTIN) 300 MG capsule MAY TAKE UP TO 3 CAPSULES THREE TIMES DAILY BY MOUTH 270 capsule 3   lidocaine (XYLOCAINE) 2 % jelly APPLY OVER PORT A CATH TOPICALLY 1 HOUR BEFORE PORT BEING ACCESSED AS NEEDED 30 mL 2   losartan (COZAAR) 50 MG tablet Take 2 tablets (100 mg total) by mouth daily. 180 tablet 3   meclizine (ANTIVERT) 25 MG tablet TAKE 1/2 TABLET BY MOUTH 3 TIMES DAILY AS NEEDED FOR DIZZINESS 15 tablet 2   montelukast (SINGULAIR) 10 MG tablet TAKE 1 TABLET (10 MG TOTAL) BY MOUTH AT BEDTIME. 30 tablet 5   omeprazole (PRILOSEC) 40 MG capsule TAKE 1 CAPSULE BY MOUTH 2 TIMES DAILY 60 capsule 3   ondansetron (ZOFRAN) 8 MG tablet TAKE 1 TABLET BY MOUTH EVERY 8 HOURS AS NEEDED FOR NAUSEA OR VOMITING 30 tablet 3   potassium chloride SA (KLOR-CON) 20 MEQ tablet TAKE 1 TABLET BY MOUTH ONCE A DAY 30 tablet 3   prochlorperazine (COMPAZINE) 10 MG tablet TAKE 1 TABLET BY MOUTH EVERY 6 HOURS AS NEEDED FOR NAUSEA OR VOMITING 30 tablet 3   rivaroxaban (XARELTO) 20 MG TABS tablet TAKE 1 TABLET BY MOUTH DAILY WITH SUPPER 30 tablet 5   spironolactone (ALDACTONE) 25 MG tablet TAKE 1 TABLET BY MOUTH ONCE DAILY 90 tablet 3   traMADol (ULTRAM) 50 MG tablet Take 1 tablet (50 mg total) by mouth every 6 (six) hours as needed for moderate pain or severe pain. 30 tablet 0   Zoster Vaccine Adjuvanted (SHINGRIX) injection INJECT 0.5 MLS INTO THE MUSCLE ONCE FOR 1 DOSE. 1 each 1    No current facility-administered medications for this visit.   Facility-Administered Medications Ordered in Other Visits  Medication Dose Route Frequency Provider Last Rate Last Admin   fam-trastuzumab deruxtecan-nxki (ENHERTU) 300 mg in dextrose 5 % 100 mL chemo infusion  4.5 mg/kg (Treatment Plan Recorded) Intravenous Once [name], MD 230 mL/hr at 01/02/21 1113 300 mg at 01/02/21 1113   heparin lock flush 100 unit/mL  500 Units Intracatheter Once PRN [name], MD       sodium chloride flush (NS) 0.9 % injection 10 mL  10 mL Intracatheter PRN [name], MD   10 mL at 05/28/20 0935   sodium chloride flush (NS) 0.9 % injection 10 mL  10 mL Intracatheter PRN [name], MD        PHYSICAL EXAMINATION: ECOG PERFORMANCE STATUS: 1 - Symptomatic but completely ambulatory  Vitals:   01/02/21 0904  BP: 119/85  Pulse: 72  Resp: 19  Temp: 99.4 F (37.4 C)  SpO2: 100%   Filed Weights   01/02/21 0904  Weight: 150 lb 1.6 oz (68.1 kg)    GENERAL:alert, no distress and comfortable SKIN: skin color, texture, turgor are normal, no rashes or significant lesions EYES: normal,  Conjunctiva are pink and non-injected, sclera clear  NECK: supple, thyroid normal size, non-tender, without nodularity LYMPH:  no palpable lymphadenopathy in the cervical, axillary  LUNGS: clear to auscultation and percussion with normal breathing effort HEART: regular rate & rhythm and no murmurs and no lower extremity edema ABDOMEN:abdomen soft, non-tender and normal bowel sounds Musculoskeletal:no cyanosis of digits and no clubbing  NEURO: alert & oriented x 3 with fluent speech, no focal motor/sensory deficits  LABORATORY DATA:  I have reviewed the data as listed CBC Latest Ref Rng & Units 01/02/2021 12/12/2020 11/20/2020  WBC 4.0 - 10.5 K/uL 3.7(L) 4.6 6.2  Hemoglobin 12.0 - 15.0 g/dL 9.3(L) 9.0(L) 9.6(L)  Hematocrit 36.0 - 46.0 % 29.2(L) 28.2(L) 29.8(L)  Platelets 150 - 400 K/uL 190 205 209     CMP Latest  Ref Rng & Units 01/02/2021 12/12/2020 11/20/2020  Glucose 70 - 99 mg/dL 112(H) 96 90  BUN 8 - 23 mg/dL _0 Creatinine 0.44 - 1.00 mg/dL 1.26(H) 1.36(H) 1.31(H)  Sodium 135 - 145 mmol/L 135 138 136  Potassium 3.5 - 5.1 mmol/L 4.3 4.5 4.8  Chloride 98 - 111 mmol/L 103 104 101  CO2 22 - 32 mmol/L _1 Calcium 8.9 - 10.3 mg/dL 9.6 9.8 9.8  Total Protein 6.5 - 8.1 g/dL 6.7 6.4(L) 6.7  Total Bilirubin 0.3 - 1.2 mg/dL 0.2(L) 0.3 0.3  Alkaline Phos 38 - 126 U/L 132(H) 127(H) 124  AST 15 - 41 U/L 39 30 29  ALT 0 - 44 U/L _2 RADIOGRAPHIC STUDIES: I have personally reviewed the radiological images as listed and agreed with the findings in the report. No results found.   ASSESSMENT & PLAN:  DELISHA PEADEN is a 65 y.o. female with    1. 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 She presented with diffuse liver metastasis, two right breast masses and right axillary adenopathy. Breast mass biopsy showed ER PR positive, but 1 was HER-2 positive, the other one was HER-2 negative. Liver met was HER2-. -Given the metastatic disease, her cancer is not curable but still treatable.  -She had been treated with first line maintenance treatment with letrozole, trastuzumab and Perjeta, until recent disease progression on 07/28/20 CT CAP. -I started her on second-line Enhertu q3weeks starting 08/08/20. Given poor toleration with C1 (nausea, diarrhea, poor appetite), dose reduced with C2 and she tolerated much better. Her restaging CT from 5/5 showed partial response. Repeat in 02/2021 -She is clinically stable and continues to tolerate treatment mostly well. She has noted left scalp head pain for the past 2-3 weeks. Will obtain MRI brain scan in 1-2 weeks. She is agreeable.  -Labs reviewed, WBC 3.7, Hg 9.3. Overall adequate to proceed with C8 Enhertu today at same dose. Will continue every 3 weeks.  -F/u in 3  weeks.     2. Peripheral neuropathy, grade 2, Right Foot pain, Secondary to chemotherapy -I previously stropped Abraxane on 10/13/18.  -She will also continue OTC Calcium and Vit D, Oral B12, Gabapentin 322m 3 tabs TID and Cymbalta to 468m Tramadol every other night for right foot pain. Well controlled.   -stable    3. 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 Anemia, Secondary to chemo -Continue monitoring, will consider blood transfusion if hemoglobin less than 8. -On Enhertu, her anemia has worsened and moderate, but stable.  5. Goal of Care Discussion, Social support, -She lives alone, has her sister and niece in [place]. Her daughter lives in a few hours away   -On Ativan PRN due to anxiety about diagnosis and treatment -The patient understands the goal of care is palliative. -She is full code    6. Asthma  -This is secondary to post nasal drip and her Acid reflux, per Dr. Valeta Harms. Will continue Prilosec 2 tabs BID, allergy medication and nasal spray and inhalers. Her breathing has improved and adequate now.  -Continue to f/u with Dr Valeta Harms and Dr Collene Mares.     7. Syncope, HTN -During week 1 of C2 Enhertu she notes episode of syncope. Since then, she has remained more hydrated and has not had another syncopal episode.  -Patient notes occasional upper body jerk recently. I discussed watching this closely at home and if continues to worsens will evaluate for seizure.      8. Left headache   -In the past 2-3 weeks, she has had posterior left scalp pain (7-8/10) and intermittently tender. Occurs every 3 days and will resolve after taking Tylenol.  -This was not tender or felt on exam today (01/02/21). I recommend MRI brain for further evaluation, given high risk of brain mets from HER2+ breat cancer. Her last brain MRI was negative on 11/16/19. She is agreeable.     Plan: -MRI brain in 1-2 weeks.  -Labs reviewed and adequate to proceed with C8 enhertu today at same  dose.  -lab, flush, and Enhertu in 3, 6 weeks  -F/u in 3 weeks. If stable, will see her every other treatment in future    No problem-specific Assessment & Plan notes found for this encounter.   Orders Placed This Encounter  Procedures   MR Brain W Wo Contrast    New left side headache, rule out brain mets    Standing Status:   Future    Standing Expiration Date:   01/02/2022    Order Specific Question:   If indicated for the ordered procedure, I authorize the administration of contrast media per Radiology protocol    Answer:   Yes    Order Specific Question:   What is the patient's sedation requirement?    Answer:   No Sedation    Order Specific Question:   Does the patient have a pacemaker or implanted devices?    Answer:   No    Order Specific Question:   Use SRS Protocol?    Answer:   No    Order Specific Question:   Preferred imaging location?    Answer:   [facility] (table limit - 550 lbs)   All questions were answered. The patient knows to call the clinic with any problems, questions or concerns. No barriers to learning was detected. The total time spent in the appointment was 30 minutes.     [name], MD 01/02/2021   I, Joslyn Devon, am acting as scribe for [name], MD.   I have reviewed the above documentation for accuracy and completeness, and I agree with the above.

## 2021-01-02 ENCOUNTER — Inpatient Hospital Stay (HOSPITAL_BASED_OUTPATIENT_CLINIC_OR_DEPARTMENT_OTHER): Payer: Medicare Other | Admitting: Hematology

## 2021-01-02 ENCOUNTER — Encounter: Payer: Self-pay | Admitting: Hematology

## 2021-01-02 ENCOUNTER — Inpatient Hospital Stay: Payer: Medicare Other | Attending: Hematology

## 2021-01-02 ENCOUNTER — Other Ambulatory Visit: Payer: Self-pay

## 2021-01-02 ENCOUNTER — Inpatient Hospital Stay: Payer: Medicare Other

## 2021-01-02 ENCOUNTER — Inpatient Hospital Stay (HOSPITAL_BASED_OUTPATIENT_CLINIC_OR_DEPARTMENT_OTHER): Payer: Medicare Other

## 2021-01-02 VITALS — BP 117/77 | HR 64 | Resp 17

## 2021-01-02 VITALS — BP 119/85 | HR 72 | Temp 99.4°F | Resp 19 | Ht 64.0 in | Wt 150.1 lb

## 2021-01-02 DIAGNOSIS — J320 Chronic maxillary sinusitis: Secondary | ICD-10-CM | POA: Diagnosis not present

## 2021-01-02 DIAGNOSIS — R55 Syncope and collapse: Secondary | ICD-10-CM | POA: Insufficient documentation

## 2021-01-02 DIAGNOSIS — C50919 Malignant neoplasm of unspecified site of unspecified female breast: Secondary | ICD-10-CM

## 2021-01-02 DIAGNOSIS — Z7189 Other specified counseling: Secondary | ICD-10-CM

## 2021-01-02 DIAGNOSIS — Z86718 Personal history of other venous thrombosis and embolism: Secondary | ICD-10-CM | POA: Insufficient documentation

## 2021-01-02 DIAGNOSIS — R630 Anorexia: Secondary | ICD-10-CM | POA: Diagnosis not present

## 2021-01-02 DIAGNOSIS — Z79899 Other long term (current) drug therapy: Secondary | ICD-10-CM | POA: Diagnosis not present

## 2021-01-02 DIAGNOSIS — Z5112 Encounter for antineoplastic immunotherapy: Secondary | ICD-10-CM | POA: Insufficient documentation

## 2021-01-02 DIAGNOSIS — I700 Atherosclerosis of aorta: Secondary | ICD-10-CM | POA: Insufficient documentation

## 2021-01-02 DIAGNOSIS — Z79811 Long term (current) use of aromatase inhibitors: Secondary | ICD-10-CM | POA: Insufficient documentation

## 2021-01-02 DIAGNOSIS — F419 Anxiety disorder, unspecified: Secondary | ICD-10-CM | POA: Diagnosis not present

## 2021-01-02 DIAGNOSIS — I10 Essential (primary) hypertension: Secondary | ICD-10-CM | POA: Diagnosis not present

## 2021-01-02 DIAGNOSIS — R197 Diarrhea, unspecified: Secondary | ICD-10-CM | POA: Insufficient documentation

## 2021-01-02 DIAGNOSIS — G620 Drug-induced polyneuropathy: Secondary | ICD-10-CM | POA: Insufficient documentation

## 2021-01-02 DIAGNOSIS — T451X5A Adverse effect of antineoplastic and immunosuppressive drugs, initial encounter: Secondary | ICD-10-CM | POA: Diagnosis not present

## 2021-01-02 DIAGNOSIS — C50811 Malignant neoplasm of overlapping sites of right female breast: Secondary | ICD-10-CM | POA: Insufficient documentation

## 2021-01-02 DIAGNOSIS — C787 Secondary malignant neoplasm of liver and intrahepatic bile duct: Secondary | ICD-10-CM | POA: Diagnosis not present

## 2021-01-02 DIAGNOSIS — J45909 Unspecified asthma, uncomplicated: Secondary | ICD-10-CM | POA: Insufficient documentation

## 2021-01-02 DIAGNOSIS — R918 Other nonspecific abnormal finding of lung field: Secondary | ICD-10-CM | POA: Diagnosis not present

## 2021-01-02 DIAGNOSIS — K449 Diaphragmatic hernia without obstruction or gangrene: Secondary | ICD-10-CM | POA: Insufficient documentation

## 2021-01-02 DIAGNOSIS — Z23 Encounter for immunization: Secondary | ICD-10-CM

## 2021-01-02 DIAGNOSIS — Z7901 Long term (current) use of anticoagulants: Secondary | ICD-10-CM | POA: Diagnosis not present

## 2021-01-02 DIAGNOSIS — D6481 Anemia due to antineoplastic chemotherapy: Secondary | ICD-10-CM | POA: Diagnosis not present

## 2021-01-02 DIAGNOSIS — K8020 Calculus of gallbladder without cholecystitis without obstruction: Secondary | ICD-10-CM | POA: Diagnosis not present

## 2021-01-02 DIAGNOSIS — Z95828 Presence of other vascular implants and grafts: Secondary | ICD-10-CM

## 2021-01-02 DIAGNOSIS — Z170 Estrogen receptor positive status [ER+]: Secondary | ICD-10-CM | POA: Insufficient documentation

## 2021-01-02 LAB — CMP (CANCER CENTER ONLY)
ALT: 28 U/L (ref 0–44)
AST: 39 U/L (ref 15–41)
Albumin: 3.6 g/dL (ref 3.5–5.0)
Alkaline Phosphatase: 132 U/L — ABNORMAL HIGH (ref 38–126)
Anion gap: 7 (ref 5–15)
BUN: 20 mg/dL (ref 8–23)
CO2: 25 mmol/L (ref 22–32)
Calcium: 9.6 mg/dL (ref 8.9–10.3)
Chloride: 103 mmol/L (ref 98–111)
Creatinine: 1.26 mg/dL — ABNORMAL HIGH (ref 0.44–1.00)
GFR, Estimated: 48 mL/min — ABNORMAL LOW (ref >60)
Glucose, Bld: 112 mg/dL — ABNORMAL HIGH (ref 70–99)
Potassium: 4.3 mmol/L (ref 3.5–5.1)
Sodium: 135 mmol/L (ref 135–145)
Total Bilirubin: 0.2 mg/dL — ABNORMAL LOW (ref 0.3–1.2)
Total Protein: 6.7 g/dL (ref 6.5–8.1)

## 2021-01-02 LAB — CBC WITH DIFFERENTIAL (CANCER CENTER ONLY)
Abs Immature Granulocytes: 0 10*3/uL (ref 0.00–0.07)
Basophils Absolute: 0 10*3/uL (ref 0.0–0.1)
Basophils Relative: 1 %
Eosinophils Absolute: 0.1 10*3/uL (ref 0.0–0.5)
Eosinophils Relative: 3 %
HCT: 29.2 % — ABNORMAL LOW (ref 36.0–46.0)
Hemoglobin: 9.3 g/dL — ABNORMAL LOW (ref 12.0–15.0)
Immature Granulocytes: 0 %
Lymphocytes Relative: 34 %
Lymphs Abs: 1.3 10*3/uL (ref 0.7–4.0)
MCH: 29.7 pg (ref 26.0–34.0)
MCHC: 31.8 g/dL (ref 30.0–36.0)
MCV: 93.3 fL (ref 80.0–100.0)
Monocytes Absolute: 0.3 10*3/uL (ref 0.1–1.0)
Monocytes Relative: 7 %
Neutro Abs: 2 10*3/uL (ref 1.7–7.7)
Neutrophils Relative %: 55 %
Platelet Count: 190 10*3/uL (ref 150–400)
RBC: 3.13 MIL/uL — ABNORMAL LOW (ref 3.87–5.11)
RDW: 14.6 % (ref 11.5–15.5)
WBC Count: 3.7 10*3/uL — ABNORMAL LOW (ref 4.0–10.5)
nRBC: 0 % (ref 0.0–0.2)

## 2021-01-02 MED ORDER — PALONOSETRON HCL INJECTION 0.25 MG/5ML
0.2500 mg | Freq: Once | INTRAVENOUS | Status: AC
  Administered 2021-01-02: 0.25 mg via INTRAVENOUS

## 2021-01-02 MED ORDER — SODIUM CHLORIDE 0.9% FLUSH
10.0000 mL | INTRAVENOUS | Status: DC | PRN
  Administered 2021-01-02: 10 mL
  Filled 2021-01-02: qty 10

## 2021-01-02 MED ORDER — DEXTROSE 5 % IV SOLN
Freq: Once | INTRAVENOUS | Status: AC
Start: 2021-01-02 — End: 2021-01-02
  Filled 2021-01-02: qty 250

## 2021-01-02 MED ORDER — DIPHENHYDRAMINE HCL 25 MG PO CAPS
50.0000 mg | ORAL_CAPSULE | Freq: Once | ORAL | Status: AC
Start: 2021-01-02 — End: 2021-01-02
  Administered 2021-01-02: 50 mg via ORAL

## 2021-01-02 MED ORDER — ACETAMINOPHEN 325 MG PO TABS
ORAL_TABLET | ORAL | Status: AC
  Filled 2021-01-02: qty 2

## 2021-01-02 MED ORDER — ACETAMINOPHEN 325 MG PO TABS
650.0000 mg | ORAL_TABLET | Freq: Once | ORAL | Status: AC
Start: 2021-01-02 — End: 2021-01-02
  Administered 2021-01-02: 650 mg via ORAL

## 2021-01-02 MED ORDER — SODIUM CHLORIDE 0.9% FLUSH
10.0000 mL | INTRAVENOUS | Status: DC | PRN
Start: 2021-01-02 — End: 2021-01-02
  Administered 2021-01-02: 10 mL
  Filled 2021-01-02: qty 10

## 2021-01-02 MED ORDER — PALONOSETRON HCL INJECTION 0.25 MG/5ML
INTRAVENOUS | Status: AC
  Filled 2021-01-02: qty 5

## 2021-01-02 MED ORDER — HEPARIN SOD (PORK) LOCK FLUSH 100 UNIT/ML IV SOLN
500.0000 [IU] | Freq: Once | INTRAVENOUS | Status: AC | PRN
  Administered 2021-01-02: 500 [IU]
  Filled 2021-01-02: qty 5

## 2021-01-02 MED ORDER — DIPHENHYDRAMINE HCL 25 MG PO CAPS
ORAL_CAPSULE | ORAL | Status: AC
  Filled 2021-01-02: qty 2

## 2021-01-02 MED ORDER — SODIUM CHLORIDE 0.9 % IV SOLN
10.0000 mg | Freq: Once | INTRAVENOUS | Status: AC
  Administered 2021-01-02: 10 mg via INTRAVENOUS
  Filled 2021-01-02: qty 10

## 2021-01-02 MED ORDER — FAM-TRASTUZUMAB DERUXTECAN-NXKI CHEMO 100 MG IV SOLR
4.5000 mg/kg | Freq: Once | INTRAVENOUS | Status: AC
  Administered 2021-01-02: 300 mg via INTRAVENOUS
  Filled 2021-01-02: qty 15

## 2021-01-03 LAB — CANCER ANTIGEN 27.29: CA 27.29: 112.6 U/mL — ABNORMAL HIGH (ref 0.0–38.6)

## 2021-01-06 ENCOUNTER — Other Ambulatory Visit: Payer: Self-pay

## 2021-01-06 ENCOUNTER — Ambulatory Visit (HOSPITAL_COMMUNITY)
Admission: RE | Admit: 2021-01-06 | Discharge: 2021-01-06 | Disposition: A | Discharge status: Home / Self Care | Payer: Medicare Other | Source: Ambulatory Visit | Attending: Hematology | Admitting: Hematology

## 2021-01-06 ENCOUNTER — Telehealth: Payer: Self-pay | Admitting: Hematology

## 2021-01-06 DIAGNOSIS — C50919 Malignant neoplasm of unspecified site of unspecified female breast: Secondary | ICD-10-CM | POA: Insufficient documentation

## 2021-01-06 MED ORDER — GADOBUTROL 1 MMOL/ML IV SOLN
7.0000 mL | Freq: Once | INTRAVENOUS | Status: AC | PRN
  Administered 2021-01-06: 7 mL via INTRAVENOUS

## 2021-01-06 NOTE — Telephone Encounter (Signed)
Scheduled follow-up appointment per 6/17 los. Patient is aware. 

## 2021-01-09 ENCOUNTER — Telehealth: Payer: Self-pay

## 2021-01-09 NOTE — Telephone Encounter (Signed)
I left vm for Teresa Hays letting her know her brain MRI was normal

## 2021-01-09 NOTE — Telephone Encounter (Signed)
-----   Message from [name], MD sent at 01/09/2021  7:18 AM EDT ----- Please let pt know her MRI results, thanks   [name]  01/09/2021

## 2021-01-12 ENCOUNTER — Other Ambulatory Visit (HOSPITAL_COMMUNITY): Payer: Self-pay

## 2021-01-15 ENCOUNTER — Other Ambulatory Visit (HOSPITAL_COMMUNITY): Payer: Self-pay

## 2021-01-16 ENCOUNTER — Other Ambulatory Visit (HOSPITAL_COMMUNITY): Payer: Medicare Other

## 2021-01-23 ENCOUNTER — Inpatient Hospital Stay: Payer: Medicare Other | Attending: Hematology

## 2021-01-23 ENCOUNTER — Inpatient Hospital Stay (HOSPITAL_BASED_OUTPATIENT_CLINIC_OR_DEPARTMENT_OTHER): Payer: Medicare Other | Admitting: Hematology

## 2021-01-23 ENCOUNTER — Other Ambulatory Visit: Payer: Self-pay

## 2021-01-23 ENCOUNTER — Inpatient Hospital Stay: Payer: Medicare Other

## 2021-01-23 ENCOUNTER — Other Ambulatory Visit (HOSPITAL_COMMUNITY): Payer: Self-pay

## 2021-01-23 VITALS — BP 112/78 | HR 69 | Temp 98.9°F | Resp 16 | Ht 64.0 in | Wt 149.7 lb

## 2021-01-23 DIAGNOSIS — C50919 Malignant neoplasm of unspecified site of unspecified female breast: Secondary | ICD-10-CM

## 2021-01-23 DIAGNOSIS — G620 Drug-induced polyneuropathy: Secondary | ICD-10-CM | POA: Insufficient documentation

## 2021-01-23 DIAGNOSIS — I700 Atherosclerosis of aorta: Secondary | ICD-10-CM | POA: Insufficient documentation

## 2021-01-23 DIAGNOSIS — M79671 Pain in right foot: Secondary | ICD-10-CM | POA: Diagnosis not present

## 2021-01-23 DIAGNOSIS — R197 Diarrhea, unspecified: Secondary | ICD-10-CM | POA: Diagnosis not present

## 2021-01-23 DIAGNOSIS — I10 Essential (primary) hypertension: Secondary | ICD-10-CM | POA: Insufficient documentation

## 2021-01-23 DIAGNOSIS — R5383 Other fatigue: Secondary | ICD-10-CM | POA: Insufficient documentation

## 2021-01-23 DIAGNOSIS — C50811 Malignant neoplasm of overlapping sites of right female breast: Secondary | ICD-10-CM | POA: Diagnosis present

## 2021-01-23 DIAGNOSIS — Z5112 Encounter for antineoplastic immunotherapy: Secondary | ICD-10-CM | POA: Diagnosis present

## 2021-01-23 DIAGNOSIS — C787 Secondary malignant neoplasm of liver and intrahepatic bile duct: Secondary | ICD-10-CM | POA: Insufficient documentation

## 2021-01-23 DIAGNOSIS — Z9221 Personal history of antineoplastic chemotherapy: Secondary | ICD-10-CM | POA: Diagnosis not present

## 2021-01-23 DIAGNOSIS — J45909 Unspecified asthma, uncomplicated: Secondary | ICD-10-CM | POA: Insufficient documentation

## 2021-01-23 DIAGNOSIS — Z7901 Long term (current) use of anticoagulants: Secondary | ICD-10-CM | POA: Insufficient documentation

## 2021-01-23 DIAGNOSIS — D6481 Anemia due to antineoplastic chemotherapy: Secondary | ICD-10-CM | POA: Insufficient documentation

## 2021-01-23 DIAGNOSIS — D3501 Benign neoplasm of right adrenal gland: Secondary | ICD-10-CM | POA: Diagnosis not present

## 2021-01-23 DIAGNOSIS — Z7189 Other specified counseling: Secondary | ICD-10-CM

## 2021-01-23 DIAGNOSIS — K219 Gastro-esophageal reflux disease without esophagitis: Secondary | ICD-10-CM | POA: Insufficient documentation

## 2021-01-23 DIAGNOSIS — I6782 Cerebral ischemia: Secondary | ICD-10-CM | POA: Diagnosis not present

## 2021-01-23 DIAGNOSIS — Z79899 Other long term (current) drug therapy: Secondary | ICD-10-CM | POA: Insufficient documentation

## 2021-01-23 DIAGNOSIS — T451X5A Adverse effect of antineoplastic and immunosuppressive drugs, initial encounter: Secondary | ICD-10-CM | POA: Insufficient documentation

## 2021-01-23 DIAGNOSIS — C773 Secondary and unspecified malignant neoplasm of axilla and upper limb lymph nodes: Secondary | ICD-10-CM | POA: Insufficient documentation

## 2021-01-23 DIAGNOSIS — Z170 Estrogen receptor positive status [ER+]: Secondary | ICD-10-CM | POA: Insufficient documentation

## 2021-01-23 DIAGNOSIS — Z95828 Presence of other vascular implants and grafts: Secondary | ICD-10-CM

## 2021-01-23 DIAGNOSIS — Z79811 Long term (current) use of aromatase inhibitors: Secondary | ICD-10-CM | POA: Diagnosis not present

## 2021-01-23 DIAGNOSIS — R42 Dizziness and giddiness: Secondary | ICD-10-CM | POA: Insufficient documentation

## 2021-01-23 DIAGNOSIS — R55 Syncope and collapse: Secondary | ICD-10-CM | POA: Insufficient documentation

## 2021-01-23 LAB — CBC WITH DIFFERENTIAL (CANCER CENTER ONLY)
Abs Immature Granulocytes: 0.01 10*3/uL (ref 0.00–0.07)
Basophils Absolute: 0 10*3/uL (ref 0.0–0.1)
Basophils Relative: 0 %
Eosinophils Absolute: 0.2 10*3/uL (ref 0.0–0.5)
Eosinophils Relative: 4 %
HCT: 28.8 % — ABNORMAL LOW (ref 36.0–46.0)
Hemoglobin: 9.4 g/dL — ABNORMAL LOW (ref 12.0–15.0)
Immature Granulocytes: 0 %
Lymphocytes Relative: 28 %
Lymphs Abs: 1.4 10*3/uL (ref 0.7–4.0)
MCH: 29.8 pg (ref 26.0–34.0)
MCHC: 32.6 g/dL (ref 30.0–36.0)
MCV: 91.4 fL (ref 80.0–100.0)
Monocytes Absolute: 0.3 10*3/uL (ref 0.1–1.0)
Monocytes Relative: 6 %
Neutro Abs: 3 10*3/uL (ref 1.7–7.7)
Neutrophils Relative %: 62 %
Platelet Count: 210 10*3/uL (ref 150–400)
RBC: 3.15 MIL/uL — ABNORMAL LOW (ref 3.87–5.11)
RDW: 14.9 % (ref 11.5–15.5)
WBC Count: 4.8 10*3/uL (ref 4.0–10.5)
nRBC: 0 % (ref 0.0–0.2)

## 2021-01-23 LAB — CMP (CANCER CENTER ONLY)
ALT: 56 U/L — ABNORMAL HIGH (ref 0–44)
AST: 67 U/L — ABNORMAL HIGH (ref 15–41)
Albumin: 3.3 g/dL — ABNORMAL LOW (ref 3.5–5.0)
Alkaline Phosphatase: 288 U/L — ABNORMAL HIGH (ref 38–126)
Anion gap: 9 (ref 5–15)
BUN: 12 mg/dL (ref 8–23)
CO2: 24 mmol/L (ref 22–32)
Calcium: 9.4 mg/dL (ref 8.9–10.3)
Chloride: 103 mmol/L (ref 98–111)
Creatinine: 1.25 mg/dL — ABNORMAL HIGH (ref 0.44–1.00)
GFR, Estimated: 48 mL/min — ABNORMAL LOW (ref >60)
Glucose, Bld: 99 mg/dL (ref 70–99)
Potassium: 4.5 mmol/L (ref 3.5–5.1)
Sodium: 136 mmol/L (ref 135–145)
Total Bilirubin: 0.3 mg/dL (ref 0.3–1.2)
Total Protein: 6.7 g/dL (ref 6.5–8.1)

## 2021-01-23 MED ORDER — SODIUM CHLORIDE 0.9% FLUSH
10.0000 mL | INTRAVENOUS | Status: DC | PRN
  Administered 2021-01-23: 10 mL
  Filled 2021-01-23: qty 10

## 2021-01-23 MED ORDER — DEXTROSE 5 % IV SOLN
Freq: Once | INTRAVENOUS | Status: AC
  Filled 2021-01-23: qty 250

## 2021-01-23 MED ORDER — ACETAMINOPHEN 325 MG PO TABS
650.0000 mg | ORAL_TABLET | Freq: Once | ORAL | Status: AC
Start: 2021-01-23 — End: 2021-01-23
  Administered 2021-01-23: 650 mg via ORAL

## 2021-01-23 MED ORDER — DIPHENHYDRAMINE HCL 25 MG PO CAPS
50.0000 mg | ORAL_CAPSULE | Freq: Once | ORAL | Status: AC
  Administered 2021-01-23: 50 mg via ORAL

## 2021-01-23 MED ORDER — ACETAMINOPHEN 325 MG PO TABS
ORAL_TABLET | ORAL | Status: AC
  Filled 2021-01-23: qty 2

## 2021-01-23 MED ORDER — PALONOSETRON HCL INJECTION 0.25 MG/5ML
INTRAVENOUS | Status: AC
  Filled 2021-01-23: qty 5

## 2021-01-23 MED ORDER — DIPHENOXYLATE-ATROPINE 2.5-0.025 MG PO TABS
1.0000 | ORAL_TABLET | Freq: Four times a day (QID) | ORAL | 0 refills | Status: DC | PRN
  Filled 2021-01-23: qty 30, 3d supply, fill #0

## 2021-01-23 MED ORDER — HEPARIN SOD (PORK) LOCK FLUSH 100 UNIT/ML IV SOLN
500.0000 [IU] | Freq: Once | INTRAVENOUS | Status: AC | PRN
  Administered 2021-01-23: 500 [IU]
  Filled 2021-01-23: qty 5

## 2021-01-23 MED ORDER — FAM-TRASTUZUMAB DERUXTECAN-NXKI CHEMO 100 MG IV SOLR
4.5000 mg/kg | Freq: Once | INTRAVENOUS | Status: AC
  Administered 2021-01-23: 300 mg via INTRAVENOUS
  Filled 2021-01-23: qty 15

## 2021-01-23 MED ORDER — PALONOSETRON HCL INJECTION 0.25 MG/5ML
0.2500 mg | Freq: Once | INTRAVENOUS | Status: AC
Start: 2021-01-23 — End: 2021-01-23
  Administered 2021-01-23: 0.25 mg via INTRAVENOUS

## 2021-01-23 MED ORDER — SODIUM CHLORIDE 0.9 % IV SOLN
10.0000 mg | Freq: Once | INTRAVENOUS | Status: AC
  Administered 2021-01-23: 10 mg via INTRAVENOUS
  Filled 2021-01-23: qty 10

## 2021-01-23 MED ORDER — DIPHENHYDRAMINE HCL 25 MG PO CAPS
ORAL_CAPSULE | ORAL | Status: AC
  Filled 2021-01-23: qty 2

## 2021-01-23 NOTE — Progress Notes (Signed)
[place]   [phone] Fax:(336) [phone]   Clinic Follow up Note   Patient Care Team: [name], DO as PCP - General (Family Medicine) [name], MD as Consulting Physician (Gastroenterology) Teresa Merle, MD as Consulting Physician (Hematology)  Date of Service:  01/23/2021  CHIEF COMPLAINT: f/u of metastatic breast cancer  SUMMARY OF ONCOLOGIC HISTORY: Oncology History Overview Note  Cancer Staging Metastatic breast cancer [facility]) Staging form: Breast, AJCC 8th Edition - Clinical stage from 03/24/2018: Stage IV (cT2, cN1, pM1, G3, ER+, PR+, HER2+) - Signed by Teresa Merle, MD on 03/30/2018     Metastatic breast cancer ([place])  01/30/2018 Procedure   Colonoscopy showed small polyp in the sigmoid colon, removed, the exam of colon including the terminal ileum was otherwise negative.    01/30/2018 Procedure   EGD by Dr. Collene Mares showed small hiatal hernia, a 8 mm polypoid lesion in the cardia, biopsied.    03/09/2018 Imaging   03/09/2018 US Abdomen IMPRESSION: 1. Mass lesions throughout the liver, consistent with metastatic disease. Liver as a somewhat nodular contour suggesting underlying hepatic cirrhosis. Inhomogeneous echotexture to the liver.   2. Cholelithiasis with mild gallbladder wall thickening. A degree of cholecystitis cannot be excluded by ultrasound.   3. Portions of pancreas obscured by gas. Visualized portions of pancreas appear normal.   4. Small right kidney. Etiology uncertain. This finding potentially may be indicative of renal artery stenosis. In this regard, question whether patient is hypertensive.   03/15/2018 Imaging   CT CAP with contrast  IMPRESSION: 1. Widespread hepatic metastasis. 2. 2.6 cm lateral right breast soft tissue nodule could represent a breast primary or an incidental benign lesion. Consider correlation with mammogram and ultrasound. 3. No definite source of primary malignancy identified within the abdomen  or pelvis. There is possible rectosigmoid junction wall thickening. Consider colonoscopy with attention to this area. 4. Distal esophageal wall thickening, suggesting esophagitis.   03/24/2018 Cancer Staging   Staging form: Breast, AJCC 8th Edition - Clinical stage from 03/24/2018: Stage IV (cT2, cN1, pM1, G3, ER+, PR+, HER2+) - Signed by Teresa Merle, MD on 03/30/2018    03/24/2018 Initial Biopsy   Diagnosis 1. 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 Breast, right, needle core biopsy, 9 o'clock, 7cm from nipple - INVASIVE DUCTAL CARCINOMA. -The carcinoma is somewhat morphologically dissimilar from that in part 1. It appears grade III 3. Lymph node, needle/core biopsy, right axillary - METASTATIC CARCINOMA IN 1 OF 1 LYMPH NODE (1/1).    03/24/2018 Receptors her2   Breast biopsy: 1. Estrogen Receptor: 40%, POSITIVE, STRONG-MODERATE STAINING INTENSITY Progesterone Receptor: 70%, POSITIVE, STRONG STAINING INTENSITY Proliferation Marker Ki67: 20% HER 2 equivocal by IHC 2+, POSITIVE by FISH, ratio 2.4 and copy #4.2  2. Estrogen Receptor: 60%, POSITIVE, MODERATE STAINING INTENSITY Progesterone Receptor: 40%, POSITIVE, MODERATE STAINING INTENSITY Proliferation Marker Ki67: 20% HER2 (+) by IHC 3+    03/24/2018 Initial Diagnosis   Metastatic breast cancer ([place])    03/27/2018 Pathology Results   Diagnosis Liver, needle/core biopsy, Right - METASTATIC CARCINOMA TO LIVER, CONSISTENT WITH PATIENTS CLINICAL HISTORY OF PRIMARY BREAST CARCINOMA.  ER 80%+ PR40%+ HER2- (by [facility], IHC 2+)  Ki67 50%     03/28/2018 Pathology Results   03/28/2018 Surgical Pathology Diagnosis 1. Breast, left, needle core biopsy, 9 o'clock - FIBROCYSTIC CHANGES. - THERE IS NO EVIDENCE OF MALIGNANCY. 2. Breast, left, needle core biopsy,  2 o'clock - FIBROADENOMA. - THERE IS NO EVIDENCE OF MALIGNANCY. - SEE COMMENT.     03/29/2018 Imaging   03/29/2018 Bone Scan IMPRESSION: No scintigraphic evidence of osseous metastatic disease.   03/30/2018 Imaging   Bone scan  IMPRESSION: No scintigraphic evidence of osseous metastatic disease.     04/07/2018 - 07/30/2020 Chemotherapy   First line chemo weekly Taxol and herceptin/Perjeta every 3 weeks starting 04/07/18. She developed infusion reaction to taxol and it was discontinued. Added Abraxane on C1D8, 2 weeks on/1 week off.  Abraxne stopped after 10/13/18 due to worsening Neuropathy. She has continued with maintenance Herceptin injection/Perjeta q3weeks. Herceptin changed to injection on 04/06/19. Both injections switched to combination Phesgo on 10/01/19. ----Stopped 07/30/20 due to disease progression in liver.    06/06/2018 Imaging   CT CAP IMPRESSION: 1. Generally improved appearance, with reduced axillary adenopathy and reduced enhancing component of the hepatic masses, with some of the hepatic mass is moderately smaller than on the prior exam. Reduced size of the right lateral breast mass compared to the prior 03/15/2018 exam. 2. New mild interstitial accentuation in the lungs, significance uncertain. Part of this appearance may be due to lower lung volumes on today's exam. 3. Mild wall thickening in the descending colon and upper rectum suggesting low-grade colitis/inflammation. Prominent stool throughout the colon favors constipation. 4. Other imaging findings of potential clinical significance: Aortic Atherosclerosis (ICD10-I70.0). Mild cardiomegaly. Mild nodularity in the right lower lobe appears stable. Contracted and thick-walled gallbladder.      09/18/2018 Imaging   CT CAP W Contrast 09/18/18  IMPRESSION: 1. Liver metastases have decreased in size. 2. Right breast mass, mild right axillary lymphadenopathy and scattered tiny right pulmonary nodules are all stable. 3. New mild left supraclavicular and left subpectoral lymphadenopathy, can not exclude  progression of metastatic nodal disease. 4. Moderate colorectal stool volume, which may indicate constipation. 5.  Aortic Atherosclerosis (ICD10-I70.0).    10/2018 - 07/30/2020 Anti-estrogen oral therapy   Letrozole 2.5 mg daily starting 10/2018 D/c to proceed with Inhertu treatment after liver met progression.    01/10/2019 Imaging   CT CAP W Contrast 01/10/19  IMPRESSION: 1. Continued improvement in the hepatic metastatic lesions which have reduced in size. 2. Stable mild left supraclavicular and subpectoral adenopathy. 3. Essentially stable small right lower lobe pulmonary nodule and separate small subpleural nodule along the right hemidiaphragm. Surveillance suggested. 4. Other imaging findings of potential clinical significance: Mild cardiomegaly. Mild circumferential distal esophageal wall thickening, the most common cause would be esophagitis. Airway thickening is present, suggesting bronchitis or reactive airways disease. Airway plugging in the lower lobes and in the right middle lobe. Stable small right adrenal adenoma.   05/15/2019 Imaging   CT CAP W Contrast 05/15/19  IMPRESSION: CT CHEST IMPRESSION   1. Similar appearance of borderline supraclavicular, axillary, and subpectoral adenopathy. 2. Improved and resolved right lower lobe pulmonary nodularity. 3. Esophageal air fluid level suggests dysmotility or gastroesophageal reflux.   CT ABDOMEN AND PELVIS IMPRESSION   1. Improved hepatic metastasis. 2. Small bowel mesenteric lymph nodes which are upper normal and mildly enlarged. Likely increased and similar as detailed above. Indeterminate. Recommend attention on follow-up. 3. Cholelithiasis. 4. Motion degradation throughout the lower chest and abdomen.   09/19/2019 Imaging   CT CAP W contrast  IMPRESSION: 1. Interval decrease in size of right axillary and subpectoral lymph nodes. There are no pathologically enlarged lymph nodes remaining in the chest, abdomen,  or pelvis. No new lymphadenopathy. 2. No significant  change in post treatment appearance of multiple low-attenuation liver lesions, in keeping with treated metastases. 3. No evidence of new metastatic disease in the chest, abdomen, or pelvis. 4. Aortic Atherosclerosis (ICD10-I70.0).   11/16/2019 Imaging   IMRI Brain  MPRESSION: Minimal chronic microvascular ischemic changes. No acute intracranial process.   Active bilateral maxillary sinus disease with mild frontoethmoid mucosal thickening.   01/23/2020 Imaging   CT CAP W contrast  IMPRESSION: 1. Stable exam. No new or progressive interval findings. 2. Stable appearance of upper normal right axillary lymph nodes. Tiny subpectoral and left axillary nodes are unchanged. 3. Generally similar appearance of ill-defined hypoattenuating lesions in the liver compatible with treated metastases. 1 lesion in the central liver is minimally more conspicuous today, likely related to bolus timing and attention on follow-up recommended. No new suspicious liver lesion on today's study. 4. Stable 10 mm right adrenal nodule., indeterminate. Continued attention on follow-up imaging recommended. 5. Cholelithiasis. 6. Aortic Atherosclerosis (ICD10-I70.0).   02/11/2020 Breast MRI   IMPRESSION: 1. 7 millimeter focus of residual enhancement associated with the known malignancy in the 9 o'clock location of the RIGHT breast. 2. No significant enhancement in the known malignancy in the 11:30 o'clock location of the RIGHT breast. 3. Interval resolution of axillary adenopathy.   07/28/2020 Imaging   CT CAP  IMPRESSION: 1. Interval progression of hepatic metastases. 2. Stable indeterminate right adrenal nodule. 3. No new sites of metastatic disease in the chest, abdomen or pelvis. 4. Chronic findings include: Small hiatal hernia. Cholelithiasis. Aortic Atherosclerosis (ICD10-I70.0).     08/08/2020 -  Chemotherapy   Second-line Inhertu q3weeks starting  08/08/20    01/06/2021 Imaging   MRI Brain  IMPRESSION: No evidence of acute intracranial abnormality.   No evidence of intracranial metastatic disease.   Stable noncontrast MRI appearance of the brain as compared to 11/16/2019.   Mild chronic small vessel ischemic changes within the cerebral white matter.   Redemonstrated tiny chronic lacunar infarct within the right cerebellar hemisphere.   Mild paranasal sinus disease, as described      CURRENT THERAPY:  Second-line Enhertu q3weeks starting 08/08/20. Due to poor toleration with C1, she required dose reduction from C2.   INTERVAL HISTORY:  Teresa Hays is here for a follow up of metastatic breast cancer. She was last seen by me on 01/02/21. She presents to the clinic alone. She reports she has been having diarrhea recently. Otherwise, she is doing well.  All other systems were reviewed with the patient and are negative.  MEDICAL HISTORY:  Past Medical History:  Diagnosis Date   GERD (gastroesophageal reflux disease)    Hypertension    Personal history of chemotherapy    rt breast ca with mets to liver dx'd 03/2018    SURGICAL HISTORY: Past Surgical History:  Procedure Laterality Date   BREAST BIOPSY Right 03/24/2018   CA x3   BREAST BIOPSY Left 03/28/2018   neg   BREAST BIOPSY Left 03/30/2018   neg   COLONOSCOPY     ESOPHAGOGASTRODUODENOSCOPY ENDOSCOPY     IR IMAGING GUIDED PORT INSERTION  04/04/2018    I have reviewed the social history and family history with the patient and they are unchanged from previous note.  ALLERGIES:  has No Known Allergies.  MEDICATIONS:  Current Outpatient Medications  Medication Sig Dispense Refill   diphenoxylate-atropine (LOMOTIL) 2.5-0.025 MG tablet Take 1-2 tablets by mouth 4 (four) times daily as needed for diarrhea or loose stools. 30 tablet 0  amLODipine (NORVASC) 5 MG tablet TAKE 1 TABLET BY MOUTH ONCE DAILY 30 tablet 2   Budeson-Glycopyrrol-Formoterol  160-9-4.8 MCG/ACT AERO INHALE 2 PUFFS BY MOUTH INTO THE LUNGS IN THE MORNING AND AT BEDTIME. 10.7 g 6   carvedilol (COREG) 3.125 MG tablet TAKE 1 TABLET BY MOUTH TWICE DAILY. REPLACES ATENOLOL 180 tablet 3   DULoxetine (CYMBALTA) 20 MG capsule TAKE 2 CAPSULES BY MOUTH DAILY 60 capsule 3   gabapentin (NEURONTIN) 300 MG capsule MAY TAKE UP TO 3 CAPSULES THREE TIMES DAILY BY MOUTH 270 capsule 3   lidocaine (XYLOCAINE) 2 % jelly APPLY OVER PORT A CATH TOPICALLY 1 HOUR BEFORE PORT BEING ACCESSED AS NEEDED 30 mL 2   losartan (COZAAR) 50 MG tablet Take 2 tablets (100 mg total) by mouth daily. 180 tablet 3   meclizine (ANTIVERT) 25 MG tablet TAKE 1/2 TABLET BY MOUTH 3 TIMES DAILY AS NEEDED FOR DIZZINESS 15 tablet 2   montelukast (SINGULAIR) 10 MG tablet TAKE 1 TABLET (10 MG TOTAL) BY MOUTH AT BEDTIME. 30 tablet 5   omeprazole (PRILOSEC) 40 MG capsule TAKE 1 CAPSULE BY MOUTH 2 TIMES DAILY 60 capsule 3   ondansetron (ZOFRAN) 8 MG tablet TAKE 1 TABLET BY MOUTH EVERY 8 HOURS AS NEEDED FOR NAUSEA OR VOMITING 30 tablet 3   potassium chloride SA (KLOR-CON) 20 MEQ tablet TAKE 1 TABLET BY MOUTH ONCE A DAY 30 tablet 3   prochlorperazine (COMPAZINE) 10 MG tablet TAKE 1 TABLET BY MOUTH EVERY 6 HOURS AS NEEDED FOR NAUSEA OR VOMITING 30 tablet 3   rivaroxaban (XARELTO) 20 MG TABS tablet TAKE 1 TABLET BY MOUTH DAILY WITH SUPPER 30 tablet 5   spironolactone (ALDACTONE) 25 MG tablet TAKE 1 TABLET BY MOUTH ONCE DAILY 90 tablet 3   traMADol (ULTRAM) 50 MG tablet Take 1 tablet (50 mg total) by mouth every 6 (six) hours as needed for moderate pain or severe pain. 30 tablet 0   Zoster Vaccine Adjuvanted (SHINGRIX) injection INJECT 0.5 MLS INTO THE MUSCLE ONCE FOR 1 DOSE. 1 each 1   No current facility-administered medications for this visit.   Facility-Administered Medications Ordered in Other Visits  Medication Dose Route Frequency Provider Last Rate Last Admin   sodium chloride flush (NS) 0.9 % injection 10 mL  10 mL  Intracatheter PRN Malachy Mood, MD   10 mL at 05/28/20 0935    PHYSICAL EXAMINATION: ECOG PERFORMANCE STATUS: 1 - Symptomatic but completely ambulatory  Vitals:   01/23/21 0857  BP: 112/78  Pulse: 69  Resp: 16  Temp: 98.9 F (37.2 C)  SpO2: 100%   Filed Weights   01/23/21 0857  Weight: 149 lb 11.2 oz (67.9 kg)    Due to COVID19 we will limit examination to appearance. Patient had no complaints.  GENERAL:alert, no distress and comfortable SKIN: skin color normal, no rashes or significant lesions EYES: normal, Conjunctiva are pink and non-injected, sclera clear  NEURO: alert & oriented x 3 with fluent speech  LABORATORY DATA:  I have reviewed the data as listed CBC Latest Ref Rng & Units 01/23/2021 01/02/2021 12/12/2020  WBC 4.0 - 10.5 K/uL 4.8 3.7(L) 4.6  Hemoglobin 12.0 - 15.0 g/dL 9.1(Y) 4.7(A) 0.4(L)  Hematocrit 36.0 - 46.0 % 28.8(L) 29.2(L) 28.2(L)  Platelets 150 - 400 K/uL 210 190 205     CMP Latest Ref Rng & Units 01/23/2021 01/02/2021 12/12/2020  Glucose 70 - 99 mg/dL 99 763(H) 96  BUN 8 - 23 mg/dL 12 20 18   Creatinine 0.44 -  1.00 mg/dL 1.25(H) 1.26(H) 1.36(H)  Sodium 135 - 145 mmol/L 136 135 138  Potassium 3.5 - 5.1 mmol/L 4.5 4.3 4.5  Chloride 98 - 111 mmol/L 103 103 104  CO2 22 - 32 mmol/L $RemoveB'24 25 24  'kwHZCZlY$ Calcium 8.9 - 10.3 mg/dL 9.4 9.6 9.8  Total Protein 6.5 - 8.1 g/dL 6.7 6.7 6.4(L)  Total Bilirubin 0.3 - 1.2 mg/dL 0.3 0.2(L) 0.3  Alkaline Phos 38 - 126 U/L 288(H) 132(H) 127(H)  AST 15 - 41 U/L 67(H) 39 30  ALT 0 - 44 U/L 56(H) 28 15      RADIOGRAPHIC STUDIES: I have personally reviewed the radiological images as listed and agreed with the findings in the report. No results found.   ASSESSMENT & PLAN:  NETTYE FLEGAL is a 65 y.o. female with   1. 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 She presented with diffuse liver metastasis, two right breast masses and right axillary  adenopathy. Breast mass biopsy showed ER PR positive, but 1 was HER-2 positive, the other one was HER-2 negative. Liver met was HER2-. -Given the metastatic disease, her cancer is not curable but still treatable.  -She had been treated with first line maintenance treatment with letrozole, trastuzumab and Perjeta, until recent disease progression on 07/28/20 CT CAP. -I started her on second-line Enhertu q3weeks starting 08/08/20. Given poor toleration with C1 (nausea, diarrhea, poor appetite), dose reduced with C2 and she tolerated much better. Her restaging CT from 5/5 showed partial response. Repeat in 02/2021 -Labs reviewed, adequate to proceed with treatment. She is tolerating well and clinically doing well  -Her CA 27.29 is slowly increasing (today's result is pending). I discussed that if her level is much higher today, we can move up her restaging CT scan.   -f/u in 3 weeks    2. Peripheral neuropathy, grade 2, Right Foot pain, Secondary to chemotherapy -I previously stropped Abraxane on 10/13/18.  -She will also continue OTC Calcium and Vit D, Oral B12, Gabapentin $RemoveBeforeD'300mg'oSjfMHRVsANVWk$  3 tabs TID and Cymbalta to $RemoveBef'40mg'wjeRdciLMJ$ , Tramadol every other night for right foot pain. Well controlled.   -stable   3. Diarrhea -occurs about every 3 days -was previously on lomotil, which I refilled for her today. She has not tried imodium.   3. 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 Anemia, Secondary to chemo -Continue monitoring, will consider blood transfusion if hemoglobin less than 8. -On Enhertu, her anemia has worsened and moderate, but stable.  -Hgb 9.4 today (01/23/21)   5. Goal of Care Discussion, Social support, -She lives alone, has her sister and niece in [place]. Her daughter lives in a few hours away   -On Ativan PRN due to anxiety about diagnosis and treatment -The patient understands the goal of care is palliative. -She is full code for now    6. Asthma  -This is secondary to post nasal  drip and her Acid reflux, per Dr. Valeta Harms. Will continue Prilosec 2 tabs BID, allergy medication and nasal spray and inhalers. Her breathing has improved and adequate now.   -Continue to f/u with Dr Valeta Harms and Dr Collene Mares.     7. Syncope, HTN -During week 1 of C2 Enhertu she notes episode of syncope. Since then, she has remained more hydrated and has not had another syncopal episode.  -Patient notes occasional upper body jerk recently. I discussed watching this closely at home and if continues to worsens  will evaluate for seizure.     8. Left headache   -In the past 2-3 weeks, she has had posterior left scalp pain (7-8/10) and intermittently tender. Occurs every 3 days and will resolve after taking Tylenol. -Brain MRI 01/06/21 was negative for metastatic disease or acute intracranial abnormality.     Plan: -proceed with C9 enhertu today at same dose.  -lab, f/u, and Enhertu in 3 and 6 weeks      No problem-specific Assessment & Plan notes found for this encounter.   No orders of the defined types were placed in this encounter.  All questions were answered. The patient knows to call the clinic with any problems, questions or concerns. No barriers to learning was detected. The total time spent in the appointment was 30 minutes.     Teresa Merle, MD 01/23/2021  I, Wilburn Mylar, am acting as scribe for Teresa Merle, MD.   I have reviewed the above documentation for accuracy and completeness, and I agree with the above.

## 2021-01-23 NOTE — Patient Instructions (Signed)
[facility] MEDICAL [facility]COLOG[facility]  Discharge Instructions: ?Thank you for choosing [facility] to provide your oncology and hematology care.  ? ?If you have a lab appointment with the Cancer Center, please go directly to the Cancer Center and check in at the registration area. ?  ?Wear comfortable clothing and clothing appropriate for easy access to any Portacath or PICC line.  ? ?We strive to give you quality time with your provider. You may need to reschedule your appointment if you arrive late (15 or more minutes).  Arriving late affects you and other patients whose appointments are after yours.  Also, if you miss three or more appointments without notifying the office, you may be dismissed from the clinic at the provider?s discretion.    ?  ?For prescription refill requests, have your pharmacy contact our office and allow 72 hours for refills to be completed.   ? ?Today you received the following chemotherapy and/or immunotherapy agents: Enhertu.    ?  ?To help prevent nausea and vomiting after your treatment, we encourage you to take your nausea medication as directed. ? ?BELOW ARE SYMPTOMS THAT SHOULD BE REPORTED IMMEDIATELY: ?*FEVER GREATER THAN 100.4 F (38 ?C) OR HIGHER ?*CHILLS OR SWEATING ?*NAUSEA AND VOMITING THAT IS NOT CONTROLLED WITH YOUR NAUSEA MEDICATION ?*UNUSUAL SHORTNESS OF BREATH ?*UNUSUAL BRUISING OR BLEEDING ?*URINARY PROBLEMS (pain or burning when urinating, or frequent urination) ?*BOWEL PROBLEMS (unusual diarrhea, constipation, pain near the anus) ?TENDERNESS IN MOUTH AND THROAT WITH OR WITHOUT PRESENCE OF ULCERS (sore throat, sores in mouth, or a toothache) ?UNUSUAL RASH, SWELLING OR PAIN  ?UNUSUAL VAGINAL DISCHARGE OR ITCHING  ? ?Items with * indicate a potential emergency and should be followed up as soon as possible or go to the Emergency Department if any problems should occur. ? ?Please show the CHEMOTHERAPY ALERT CARD or IMMUNOTHERAPY ALERT CARD at check-in to  the Emergency Department and triage nurse. ? ?Should you have questions after your visit or need to cancel or reschedule your appointment, please contact [facility]  Dept: [phone]  and follow the prompts.  Office hours are 8:00 a.m. to 4:30 p.m. Monday - Friday. Please note that voicemails left after 4:00 p.m. may not be returned until the following business day.  We are closed weekends and major holidays. You have access to a nurse at all times for urgent questions. Please call the main number to the clinic Dept: [phone] and follow the prompts. ? ? ?For any non-urgent questions, you may also contact your provider using MyChart. We now offer e-Visits for anyone 18 and older to request care online for non-urgent symptoms. For details visit [email or web address]. ?  ?Also download the MyChart app! Go to the app store, search "MyChart", open the app, select [place], and log in with your MyChart username and password. ? ?Due to Covid, a mask is required upon entering the hospital/clinic. If you do not have a mask, one will be given to you upon arrival. For doctor visits, patients may have 1 support person aged 18 or older with them. For treatment visits, patients cannot have anyone with them due to current Covid guidelines and our immunocompromised population.  ? ?

## 2021-01-24 ENCOUNTER — Other Ambulatory Visit (HOSPITAL_COMMUNITY): Payer: Self-pay

## 2021-01-24 ENCOUNTER — Other Ambulatory Visit: Payer: Self-pay | Admitting: Family Medicine

## 2021-01-24 ENCOUNTER — Encounter: Payer: Self-pay | Admitting: Hematology

## 2021-01-24 ENCOUNTER — Other Ambulatory Visit: Payer: Self-pay | Admitting: Hematology

## 2021-01-24 DIAGNOSIS — I10 Essential (primary) hypertension: Secondary | ICD-10-CM

## 2021-01-24 LAB — CANCER ANTIGEN 27.29: CA 27.29: 191.7 U/mL — ABNORMAL HIGH (ref 0.0–38.6)

## 2021-01-24 MED FILL — Ondansetron HCl Tab 8 MG: ORAL | 10 days supply | Qty: 30 | Fill #0 | Status: AC

## 2021-01-26 ENCOUNTER — Encounter: Payer: Self-pay | Admitting: Hematology

## 2021-01-26 ENCOUNTER — Other Ambulatory Visit (HOSPITAL_COMMUNITY): Payer: Self-pay

## 2021-01-26 MED ORDER — POTASSIUM CHLORIDE CRYS ER 20 MEQ PO TBCR
EXTENDED_RELEASE_TABLET | Freq: Every day | ORAL | 3 refills | Status: DC
  Filled 2021-01-26: qty 30, 30d supply, fill #0
  Filled 2021-02-23: qty 30, 30d supply, fill #1
  Filled 2021-03-24: qty 30, 30d supply, fill #2
  Filled 2021-04-26: qty 30, 30d supply, fill #3

## 2021-01-26 MED ORDER — AMLODIPINE BESYLATE 5 MG PO TABS
ORAL_TABLET | Freq: Every day | ORAL | 2 refills | Status: DC
Start: 2021-01-26 — End: 2021-04-10
  Filled 2021-01-26: qty 30, 30d supply, fill #0
  Filled 2021-02-23: qty 30, 30d supply, fill #1
  Filled 2021-03-24: qty 30, 30d supply, fill #2

## 2021-01-27 ENCOUNTER — Telehealth: Payer: Self-pay | Admitting: Hematology

## 2021-01-27 NOTE — Telephone Encounter (Signed)
Scheduled follow-up appointment per 7/8 los. Patient is aware. 

## 2021-02-01 ENCOUNTER — Other Ambulatory Visit (HOSPITAL_COMMUNITY): Payer: Self-pay | Admitting: Internal Medicine

## 2021-02-01 MED FILL — Budesonide-Glycopyrrolate-Formoterol Aers 160-9-4.8 MCG/ACT: RESPIRATORY_TRACT | 30 days supply | Qty: 10.7 | Fill #2 | Status: AC

## 2021-02-02 ENCOUNTER — Other Ambulatory Visit (HOSPITAL_COMMUNITY): Payer: Self-pay

## 2021-02-03 ENCOUNTER — Other Ambulatory Visit (HOSPITAL_COMMUNITY): Payer: Self-pay

## 2021-02-04 ENCOUNTER — Other Ambulatory Visit (HOSPITAL_COMMUNITY): Payer: Self-pay

## 2021-02-04 MED ORDER — CARVEDILOL 3.125 MG PO TABS
ORAL_TABLET | ORAL | 3 refills | Status: DC
  Filled 2021-02-04: qty 180, 90d supply, fill #0
  Filled 2021-05-18: qty 180, 90d supply, fill #1

## 2021-02-09 ENCOUNTER — Other Ambulatory Visit (HOSPITAL_COMMUNITY): Payer: Self-pay

## 2021-02-10 ENCOUNTER — Other Ambulatory Visit (HOSPITAL_COMMUNITY): Payer: Self-pay

## 2021-02-12 NOTE — Progress Notes (Signed)
Teresa Hays   [phone] [phone]   Clinic Follow up Note   Patient Care Team: Teresa Fee, DO as PCP - General (Family Medicine) Teresa Craver, MD as Consulting Physician (Gastroenterology) Teresa Merle, MD as Consulting Physician (Hematology)  Date of Service:  01/23/2021  CHIEF COMPLAINT: f/u of metastatic breast cancer  SUMMARY OF ONCOLOGIC HISTORY: Oncology History Overview Note  Cancer Staging Metastatic breast cancer [facility]) Staging form: Breast, AJCC 8th Edition - Clinical stage from 03/24/2018: Stage IV (cT2, cN1, pM1, G3, ER+, PR+, HER2+) - Signed by Teresa Merle, MD on 03/30/2018     Metastatic breast cancer ([place])  01/30/2018 Procedure   Colonoscopy showed small polyp in the sigmoid colon, removed, the exam of colon including the terminal ileum was otherwise negative.    01/30/2018 Procedure   EGD by Dr. Collene Hays showed small hiatal hernia, a 8 mm polypoid lesion in the cardia, biopsied.    03/09/2018 Imaging   03/09/2018 US Abdomen IMPRESSION: 1. Mass lesions throughout the liver, consistent with metastatic disease. Liver as a somewhat nodular contour suggesting underlying hepatic cirrhosis. Inhomogeneous echotexture to the liver.   2. Cholelithiasis with mild gallbladder wall thickening. A degree of cholecystitis cannot be excluded by ultrasound.   3. Portions of pancreas obscured by gas. Visualized portions of pancreas appear normal.   4. Small right kidney. Etiology uncertain. This finding potentially may be indicative of renal artery stenosis. In this regard, question whether patient is hypertensive.   03/15/2018 Imaging   CT CAP with contrast  IMPRESSION: 1. Widespread hepatic metastasis. 2. 2.6 cm lateral right breast soft tissue nodule could represent a breast primary or an incidental benign lesion. Consider correlation with mammogram and ultrasound. 3. No definite source of primary malignancy identified within the abdomen  or pelvis. There is possible rectosigmoid junction wall thickening. Consider colonoscopy with attention to this area. 4. Distal esophageal wall thickening, suggesting esophagitis.   03/24/2018 Cancer Staging   Staging form: Breast, AJCC 8th Edition - Clinical stage from 03/24/2018: Stage IV (cT2, cN1, pM1, G3, ER+, PR+, HER2+) - Signed by Teresa Merle, MD on 03/30/2018    03/24/2018 Initial Biopsy   Diagnosis 1. 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 Breast, right, needle core biopsy, 9 o'clock, 7cm from nipple - INVASIVE DUCTAL CARCINOMA. -The carcinoma is somewhat morphologically dissimilar from that in part 1. It appears grade III 3. Lymph node, needle/core biopsy, right axillary - METASTATIC CARCINOMA IN 1 OF 1 LYMPH NODE (1/1).    03/24/2018 Receptors her2   Breast biopsy: 1. Estrogen Receptor: 40%, POSITIVE, STRONG-MODERATE STAINING INTENSITY Progesterone Receptor: 70%, POSITIVE, STRONG STAINING INTENSITY Proliferation Marker Ki67: 20% HER 2 equivocal by IHC 2+, POSITIVE by FISH, ratio 2.4 and copy #4.2  2. Estrogen Receptor: 60%, POSITIVE, MODERATE STAINING INTENSITY Progesterone Receptor: 40%, POSITIVE, MODERATE STAINING INTENSITY Proliferation Marker Ki67: 20% HER2 (+) by IHC 3+    03/24/2018 Initial Diagnosis   Metastatic breast cancer ([place])    03/27/2018 Pathology Results   Diagnosis Liver, needle/core biopsy, Right - METASTATIC CARCINOMA TO LIVER, CONSISTENT WITH PATIENTS CLINICAL HISTORY OF PRIMARY BREAST CARCINOMA.  ER 80%+ PR40%+ HER2- (by [facility], IHC 2+)  Ki67 50%     03/28/2018 Pathology Results   03/28/2018 Surgical Pathology Diagnosis 1. Breast, left, needle core biopsy, 9 o'clock - FIBROCYSTIC CHANGES. - THERE IS NO EVIDENCE OF MALIGNANCY. 2. Breast, left, needle core biopsy,  2 o'clock - FIBROADENOMA. - THERE IS NO EVIDENCE OF MALIGNANCY. - SEE COMMENT.     03/29/2018 Imaging   03/29/2018 Bone Scan IMPRESSION: No scintigraphic evidence of osseous metastatic disease.   03/30/2018 Imaging   Bone scan  IMPRESSION: No scintigraphic evidence of osseous metastatic disease.     04/07/2018 - 07/30/2020 Chemotherapy   First line chemo weekly Taxol and herceptin/Perjeta every 3 weeks starting 04/07/18. She developed infusion reaction to taxol and it was discontinued. Added Abraxane on C1D8, 2 weeks on/1 week off.  Abraxne stopped after 10/13/18 due to worsening Neuropathy. She has continued with maintenance Herceptin injection/Perjeta q3weeks. Herceptin changed to injection on 04/06/19. Both injections switched to combination Phesgo on 10/01/19. ----Stopped 07/30/20 due to disease progression in liver.    06/06/2018 Imaging   CT CAP IMPRESSION: 1. Generally improved appearance, with reduced axillary adenopathy and reduced enhancing component of the hepatic masses, with some of the hepatic mass is moderately smaller than on the prior exam. Reduced size of the right lateral breast mass compared to the prior 03/15/2018 exam. 2. New mild interstitial accentuation in the lungs, significance uncertain. Part of this appearance may be due to lower lung volumes on today's exam. 3. Mild wall thickening in the descending colon and upper rectum suggesting low-grade colitis/inflammation. Prominent stool throughout the colon favors constipation. 4. Other imaging findings of potential clinical significance: Aortic Atherosclerosis (ICD10-I70.0). Mild cardiomegaly. Mild nodularity in the right lower lobe appears stable. Contracted and thick-walled gallbladder.      09/18/2018 Imaging   CT CAP W Contrast 09/18/18  IMPRESSION: 1. Liver metastases have decreased in size. 2. Right breast mass, mild right axillary lymphadenopathy and scattered tiny right pulmonary nodules are all stable. 3. New mild left supraclavicular and left subpectoral lymphadenopathy, can not exclude  progression of metastatic nodal disease. 4. Moderate colorectal stool volume, which may indicate constipation. 5.  Aortic Atherosclerosis (ICD10-I70.0).    10/2018 - 07/30/2020 Anti-estrogen oral therapy   Letrozole 2.5 mg daily starting 10/2018 D/c to proceed with Inhertu treatment after liver met progression.    01/10/2019 Imaging   CT CAP W Contrast 01/10/19  IMPRESSION: 1. Continued improvement in the hepatic metastatic lesions which have reduced in size. 2. Stable mild left supraclavicular and subpectoral adenopathy. 3. Essentially stable small right lower lobe pulmonary nodule and separate small subpleural nodule along the right hemidiaphragm. Surveillance suggested. 4. Other imaging findings of potential clinical significance: Mild cardiomegaly. Mild circumferential distal esophageal wall thickening, the most common cause would be esophagitis. Airway thickening is present, suggesting bronchitis or reactive airways disease. Airway plugging in the lower lobes and in the right middle lobe. Stable small right adrenal adenoma.   05/15/2019 Imaging   CT CAP W Contrast 05/15/19  IMPRESSION: CT CHEST IMPRESSION   1. Similar appearance of borderline supraclavicular, axillary, and subpectoral adenopathy. 2. Improved and resolved right lower lobe pulmonary nodularity. 3. Esophageal air fluid level suggests dysmotility or gastroesophageal reflux.   CT ABDOMEN AND PELVIS IMPRESSION   1. Improved hepatic metastasis. 2. Small bowel mesenteric lymph nodes which are upper normal and mildly enlarged. Likely increased and similar as detailed above. Indeterminate. Recommend attention on follow-up. 3. Cholelithiasis. 4. Motion degradation throughout the lower chest and abdomen.   09/19/2019 Imaging   CT CAP W contrast  IMPRESSION: 1. Interval decrease in size of right axillary and subpectoral lymph nodes. There are no pathologically enlarged lymph nodes remaining in the chest, abdomen,  or pelvis. No new lymphadenopathy. 2. No significant  change in post treatment appearance of multiple low-attenuation liver lesions, in keeping with treated metastases. 3. No evidence of new metastatic disease in the chest, abdomen, or pelvis. 4. Aortic Atherosclerosis (ICD10-I70.0).   11/16/2019 Imaging   IMRI Brain  MPRESSION: Minimal chronic microvascular ischemic changes. No acute intracranial process.   Active bilateral maxillary sinus disease with mild frontoethmoid mucosal thickening.   01/23/2020 Imaging   CT CAP W contrast  IMPRESSION: 1. Stable exam. No new or progressive interval findings. 2. Stable appearance of upper normal right axillary lymph nodes. Tiny subpectoral and left axillary nodes are unchanged. 3. Generally similar appearance of ill-defined hypoattenuating lesions in the liver compatible with treated metastases. 1 lesion in the central liver is minimally more conspicuous today, likely related to bolus timing and attention on follow-up recommended. No new suspicious liver lesion on today's study. 4. Stable 10 mm right adrenal nodule., indeterminate. Continued attention on follow-up imaging recommended. 5. Cholelithiasis. 6. Aortic Atherosclerosis (ICD10-I70.0).   02/11/2020 Breast MRI   IMPRESSION: 1. 7 millimeter focus of residual enhancement associated with the known malignancy in the 9 o'clock location of the RIGHT breast. 2. No significant enhancement in the known malignancy in the 11:30 o'clock location of the RIGHT breast. 3. Interval resolution of axillary adenopathy.   07/28/2020 Imaging   CT CAP  IMPRESSION: 1. Interval progression of hepatic metastases. 2. Stable indeterminate right adrenal nodule. 3. No new sites of metastatic disease in the chest, abdomen or pelvis. 4. Chronic findings include: Small hiatal hernia. Cholelithiasis. Aortic Atherosclerosis (ICD10-I70.0).     08/08/2020 -  Chemotherapy   Second-line Inhertu q3weeks starting  08/08/20    01/06/2021 Imaging   MRI Brain  IMPRESSION: No evidence of acute intracranial abnormality.   No evidence of intracranial metastatic disease.   Stable noncontrast MRI appearance of the brain as compared to 11/16/2019.   Mild chronic small vessel ischemic changes within the cerebral white matter.   Redemonstrated tiny chronic lacunar infarct within the right cerebellar hemisphere.   Mild paranasal sinus disease, as described      CURRENT THERAPY:  Second-line Enhertu q3weeks starting 08/08/20. Due to poor toleration with C1, she required dose reduction from C2.   INTERVAL HISTORY:  Teresa Hays is here for a follow up of metastatic breast cancer. She was last seen by me on 01/23/21. She presents to the clinic alone. She contacted her younger sister by phone for our appointment. She experienced vertigo and had to go to the ED on 01/29/21. She notes that's the second time that's happened while she was visiting her daughter. ("It's never happened at home!") She denies feeling sick after chemo, just fatigued.  All other systems were reviewed with the patient and are negative.  MEDICAL HISTORY:  Past Medical History:  Diagnosis Date   GERD (gastroesophageal reflux disease)    Hypertension    Personal history of chemotherapy    rt breast ca with mets to liver dx'd 03/2018    SURGICAL HISTORY: Past Surgical History:  Procedure Laterality Date   BREAST BIOPSY Right 03/24/2018   CA x3   BREAST BIOPSY Left 03/28/2018   neg   BREAST BIOPSY Left 03/30/2018   neg   COLONOSCOPY     ESOPHAGOGASTRODUODENOSCOPY ENDOSCOPY     IR IMAGING GUIDED PORT INSERTION  04/04/2018    I have reviewed the social history and family history with the patient and they are unchanged from previous note.  ALLERGIES:  has No Known Allergies.  MEDICATIONS:  Current Outpatient Medications  Medication Sig Dispense Refill   amLODipine (NORVASC) 5 MG tablet TAKE 1 TABLET BY MOUTH ONCE  DAILY 30 tablet 2   Budeson-Glycopyrrol-Formoterol 160-9-4.8 MCG/ACT AERO INHALE 2 PUFFS BY MOUTH INTO THE LUNGS IN THE MORNING AND AT BEDTIME. 10.7 g 6   carvedilol (COREG) 3.125 MG tablet TAKE 1 TABLET BY MOUTH TWICE DAILY. REPLACES ATENOLOL 180 tablet 3   diphenoxylate-atropine (LOMOTIL) 2.5-0.025 MG tablet Take 1-2 tablets by mouth 4 (four) times daily as needed for diarrhea or loose stools. 30 tablet 0   DULoxetine (CYMBALTA) 20 MG capsule TAKE 2 CAPSULES BY MOUTH DAILY 60 capsule 3   gabapentin (NEURONTIN) 300 MG capsule MAY TAKE UP TO 3 CAPSULES THREE TIMES DAILY BY MOUTH 270 capsule 3   lidocaine (XYLOCAINE) 2 % jelly APPLY OVER PORT A CATH TOPICALLY 1 HOUR BEFORE PORT BEING ACCESSED AS NEEDED 30 mL 2   losartan (COZAAR) 50 MG tablet Take 2 tablets (100 mg total) by mouth daily. 180 tablet 3   meclizine (ANTIVERT) 25 MG tablet TAKE 1/2 TABLET BY MOUTH 3 TIMES DAILY AS NEEDED FOR DIZZINESS 15 tablet 2   montelukast (SINGULAIR) 10 MG tablet TAKE 1 TABLET (10 MG TOTAL) BY MOUTH AT BEDTIME. 30 tablet 5   omeprazole (PRILOSEC) 40 MG capsule TAKE 1 CAPSULE BY MOUTH 2 TIMES DAILY 60 capsule 3   ondansetron (ZOFRAN) 8 MG tablet TAKE 1 TABLET BY MOUTH EVERY 8 HOURS AS NEEDED FOR NAUSEA OR VOMITING 30 tablet 3   potassium chloride SA (KLOR-CON) 20 MEQ tablet TAKE 1 TABLET BY MOUTH ONCE A DAY 30 tablet 3   prochlorperazine (COMPAZINE) 10 MG tablet TAKE 1 TABLET BY MOUTH EVERY 6 HOURS AS NEEDED FOR NAUSEA OR VOMITING 30 tablet 3   rivaroxaban (XARELTO) 20 MG TABS tablet TAKE 1 TABLET BY MOUTH DAILY WITH SUPPER 30 tablet 5   spironolactone (ALDACTONE) 25 MG tablet TAKE 1 TABLET BY MOUTH ONCE DAILY 90 tablet 3   traMADol (ULTRAM) 50 MG tablet Take 1 tablet (50 mg total) by mouth every 6 (six) hours as needed for moderate pain or severe pain. 30 tablet 0   Zoster Vaccine Adjuvanted (SHINGRIX) injection INJECT 0.5 MLS INTO THE MUSCLE ONCE FOR 1 DOSE. 1 each 1   No current facility-administered medications  for this visit.   Facility-Administered Medications Ordered in Other Visits  Medication Dose Route Frequency Provider Last Rate Last Admin   sodium chloride flush (NS) 0.9 % injection 10 mL  10 mL Intracatheter PRN Teresa Merle, MD   10 mL at 05/28/20 0935    PHYSICAL EXAMINATION: ECOG PERFORMANCE STATUS: 1 - Symptomatic but completely ambulatory  Vitals:   02/13/21 0807  BP: 118/78  Pulse: 75  Resp: 17  Temp: 99.3 F (37.4 C)  SpO2: 100%   Filed Weights   02/13/21 0807  Weight: 150 lb 8 oz (68.3 kg)    GENERAL:alert, no distress and comfortable SKIN: skin color normal, no rashes or significant lesions EYES: normal, Conjunctiva are pink and non-injected, sclera clear  NEURO: alert & oriented x 3 with fluent speech  LABORATORY DATA:  I have reviewed the data as listed CBC Latest Ref Rng & Units 02/13/2021 01/23/2021 01/02/2021  WBC 4.0 - 10.5 K/uL 4.6 4.8 3.7(L)  Hemoglobin 12.0 - 15.0 g/dL 9.4(L) 9.4(L) 9.3(L)  Hematocrit 36.0 - 46.0 % 29.2(L) 28.8(L) 29.2(L)  Platelets 150 - 400 K/uL 218 210 190     CMP Latest Ref Rng & Units 02/13/2021 01/23/2021  01/02/2021  Glucose 70 - 99 mg/dL 121(H) 99 112(H)  BUN 8 - 23 mg/dL $Remove'12 12 20  'NqwDlEh$ Creatinine 0.44 - 1.00 mg/dL 1.38(H) 1.25(H) 1.26(H)  Sodium 135 - 145 mmol/L 138 136 135  Potassium 3.5 - 5.1 mmol/L 4.2 4.5 4.3  Chloride 98 - 111 mmol/L 105 103 103  CO2 22 - 32 mmol/L $RemoveB'24 24 25  'BRqSqJYF$ Calcium 8.9 - 10.3 mg/dL 9.5 9.4 9.6  Total Protein 6.5 - 8.1 g/dL 6.5 6.7 6.7  Total Bilirubin 0.3 - 1.2 mg/dL 0.4 0.3 0.2(L)  Alkaline Phos 38 - 126 U/L 301(H) 288(H) 132(H)  AST 15 - 41 U/L 46(H) 67(H) 39  ALT 0 - 44 U/L 33 56(H) 28      RADIOGRAPHIC STUDIES: I have personally reviewed the radiological images as listed and agreed with the findings in the report. No results found.   ASSESSMENT & PLAN:  WYNEMA GAROUTTE is a 65 y.o. female with   1. Metastatic right breast cancer to liver, stage IV, ER+/PR+/HER2+, Liver mets ER+/PR+/HER2- -She  was diagnosed in 03/2018. She presented with diffuse liver metastasis, two right breast masses and right axillary adenopathy. Breast mass biopsy showed ER PR positive, but 1 was HER-2 positive, the other one was HER-2 negative. Liver met was HER2-. -Given the metastatic disease, her cancer is not curable but still treatable.  -She had been treated with first line maintenance treatment with letrozole, trastuzumab and Perjeta, until recent disease progression on 07/28/20 CT CAP. -I started her on second-line Enhertu q3weeks starting 08/08/20. Given poor toleration with C1 (nausea, diarrhea, poor appetite), dose reduced with C2 and she tolerated much better. Her restaging CT from 11/20/20 showed partial response.  -Her Ca 27.29 has been slowly going up for the last 2-3 months. Because of this, I recommend we slowly increase her dose of Enhertu to see how she tolerates. -Labs reviewed, adequate to proceed with treatment. We will increase today's dose by 10%. -f/u in 3 weeks before next cycle  -plan to repeat scan in early Sep   2. Syncope, HTN -During week 1 of C2 Enhertu she notes episode of syncope.  -She had another episode about a week after C9. Head CT and MRI w/o contrast on 01/29/21 were negative. -I recommended she take her BP at home. If <100, I recommend she hold amlodipine.   3. Peripheral neuropathy, grade 2, Right Foot pain, Secondary to chemotherapy -I previously stropped Abraxane on 10/13/18.  -She will also continue OTC Calcium and Vit D, Oral B12, Gabapentin $RemoveBeforeD'300mg'pfTHKiNyIptSUy$  3 tabs TID and Cymbalta to $RemoveBef'40mg'ebuglbDvtI$ , Tramadol every other night for right foot pain. Well controlled.   -stable   4. Diarrhea -occurs about every 3 days -was previously on lomotil. She has not tried imodium. -not mentioned 02/13/21   5. 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 Anemia, Secondary to chemo -Continue monitoring, will consider blood transfusion if hemoglobin less than 8. -On Enhertu, her anemia has  worsened and moderate, but stable.  -Hgb stable, 9.4 today (02/13/21)   7. Goal of Care Discussion, Social support, -She lives alone, has her sister and niece in [place]. Her daughter lives in [place] PRN due to anxiety about diagnosis and treatment -The patient understands the goal of care is palliative. -She is full code for now    8. Asthma  -This is secondary to post nasal drip and her Acid reflux, per Dr. Valeta Harms. Will continue Prilosec 2 tabs BID, allergy medication and nasal  spray and inhalers. Her breathing has improved and adequate now.   -Continue to f/u with Dr Valeta Harms and Dr Teresa Hays.     9. Left headache   -In the past 2-3 weeks, she has had posterior left scalp pain (7-8/10) and intermittently tender. Occurs every 3 days and will resolve after taking Tylenol. -Brain MRI 01/06/21 was negative for metastatic disease or acute intracranial abnormality.     Plan: -proceed with C10 Enhertu today at $Remove'5mg'lZzgoMK$ /kg (from 4.$RemoveBef'5mg'xzdaLaERao$ /kg) -lab, f/u, and Enhertu in 3 and 6 weeks     No problem-specific Assessment & Plan notes found for this encounter.   No orders of the defined types were placed in this encounter.  All questions were answered. The patient knows to call the clinic with any problems, questions or concerns. No barriers to learning was detected. The total time spent in the appointment was 30 minutes.     Teresa Merle, MD 01/23/2021  I, Teresa Hays, am acting as scribe for Teresa Merle, MD.   I have reviewed the above documentation for accuracy and completeness, and I agree with the above.

## 2021-02-13 ENCOUNTER — Inpatient Hospital Stay: Payer: Medicare Other

## 2021-02-13 ENCOUNTER — Other Ambulatory Visit: Payer: Self-pay

## 2021-02-13 ENCOUNTER — Encounter: Payer: Self-pay | Admitting: Hematology

## 2021-02-13 ENCOUNTER — Inpatient Hospital Stay (HOSPITAL_BASED_OUTPATIENT_CLINIC_OR_DEPARTMENT_OTHER): Payer: Medicare Other | Admitting: Hematology

## 2021-02-13 VITALS — BP 118/78 | HR 75 | Temp 99.3°F | Resp 17 | Wt 150.5 lb

## 2021-02-13 DIAGNOSIS — C50919 Malignant neoplasm of unspecified site of unspecified female breast: Secondary | ICD-10-CM

## 2021-02-13 DIAGNOSIS — Z7189 Other specified counseling: Secondary | ICD-10-CM

## 2021-02-13 DIAGNOSIS — Z5112 Encounter for antineoplastic immunotherapy: Secondary | ICD-10-CM | POA: Diagnosis not present

## 2021-02-13 DIAGNOSIS — Z95828 Presence of other vascular implants and grafts: Secondary | ICD-10-CM

## 2021-02-13 LAB — CBC WITH DIFFERENTIAL (CANCER CENTER ONLY)
Abs Immature Granulocytes: 0 10*3/uL (ref 0.00–0.07)
Basophils Absolute: 0.1 10*3/uL (ref 0.0–0.1)
Basophils Relative: 1 %
Eosinophils Absolute: 0.2 10*3/uL (ref 0.0–0.5)
Eosinophils Relative: 4 %
HCT: 29.2 % — ABNORMAL LOW (ref 36.0–46.0)
Hemoglobin: 9.4 g/dL — ABNORMAL LOW (ref 12.0–15.0)
Immature Granulocytes: 0 %
Lymphocytes Relative: 35 %
Lymphs Abs: 1.6 10*3/uL (ref 0.7–4.0)
MCH: 29.7 pg (ref 26.0–34.0)
MCHC: 32.2 g/dL (ref 30.0–36.0)
MCV: 92.1 fL (ref 80.0–100.0)
Monocytes Absolute: 0.4 10*3/uL (ref 0.1–1.0)
Monocytes Relative: 8 %
Neutro Abs: 2.4 10*3/uL (ref 1.7–7.7)
Neutrophils Relative %: 52 %
Platelet Count: 218 10*3/uL (ref 150–400)
RBC: 3.17 MIL/uL — ABNORMAL LOW (ref 3.87–5.11)
RDW: 15.1 % (ref 11.5–15.5)
WBC Count: 4.6 10*3/uL (ref 4.0–10.5)
nRBC: 0 % (ref 0.0–0.2)

## 2021-02-13 LAB — CMP (CANCER CENTER ONLY)
ALT: 33 U/L (ref 0–44)
AST: 46 U/L — ABNORMAL HIGH (ref 15–41)
Albumin: 3.3 g/dL — ABNORMAL LOW (ref 3.5–5.0)
Alkaline Phosphatase: 301 U/L — ABNORMAL HIGH (ref 38–126)
Anion gap: 9 (ref 5–15)
BUN: 12 mg/dL (ref 8–23)
CO2: 24 mmol/L (ref 22–32)
Calcium: 9.5 mg/dL (ref 8.9–10.3)
Chloride: 105 mmol/L (ref 98–111)
Creatinine: 1.38 mg/dL — ABNORMAL HIGH (ref 0.44–1.00)
GFR, Estimated: 43 mL/min — ABNORMAL LOW (ref >60)
Glucose, Bld: 121 mg/dL — ABNORMAL HIGH (ref 70–99)
Potassium: 4.2 mmol/L (ref 3.5–5.1)
Sodium: 138 mmol/L (ref 135–145)
Total Bilirubin: 0.4 mg/dL (ref 0.3–1.2)
Total Protein: 6.5 g/dL (ref 6.5–8.1)

## 2021-02-13 MED ORDER — FAM-TRASTUZUMAB DERUXTECAN-NXKI CHEMO 100 MG IV SOLR
5.0000 mg/kg | Freq: Once | INTRAVENOUS | Status: AC
  Administered 2021-02-13: 332 mg via INTRAVENOUS
  Filled 2021-02-13: qty 16.6

## 2021-02-13 MED ORDER — PALONOSETRON HCL INJECTION 0.25 MG/5ML
0.2500 mg | Freq: Once | INTRAVENOUS | Status: AC
  Administered 2021-02-13: 0.25 mg via INTRAVENOUS

## 2021-02-13 MED ORDER — SODIUM CHLORIDE 0.9% FLUSH
10.0000 mL | INTRAVENOUS | Status: DC | PRN
  Administered 2021-02-13: 10 mL
  Filled 2021-02-13: qty 10

## 2021-02-13 MED ORDER — DIPHENHYDRAMINE HCL 25 MG PO CAPS
50.0000 mg | ORAL_CAPSULE | Freq: Once | ORAL | Status: AC
  Administered 2021-02-13: 50 mg via ORAL

## 2021-02-13 MED ORDER — DIPHENHYDRAMINE HCL 25 MG PO CAPS
ORAL_CAPSULE | ORAL | Status: AC
  Filled 2021-02-13: qty 2

## 2021-02-13 MED ORDER — PALONOSETRON HCL INJECTION 0.25 MG/5ML
INTRAVENOUS | Status: AC
  Filled 2021-02-13: qty 5

## 2021-02-13 MED ORDER — HEPARIN SOD (PORK) LOCK FLUSH 100 UNIT/ML IV SOLN
500.0000 [IU] | Freq: Once | INTRAVENOUS | Status: AC | PRN
  Administered 2021-02-13: 500 [IU]
  Filled 2021-02-13: qty 5

## 2021-02-13 MED ORDER — ACETAMINOPHEN 325 MG PO TABS
650.0000 mg | ORAL_TABLET | Freq: Once | ORAL | Status: AC
  Administered 2021-02-13: 650 mg via ORAL

## 2021-02-13 MED ORDER — ACETAMINOPHEN 325 MG PO TABS
ORAL_TABLET | ORAL | Status: AC
  Filled 2021-02-13: qty 2

## 2021-02-13 MED ORDER — SODIUM CHLORIDE 0.9 % IV SOLN
10.0000 mg | Freq: Once | INTRAVENOUS | Status: AC
  Administered 2021-02-13: 10 mg via INTRAVENOUS
  Filled 2021-02-13: qty 10

## 2021-02-13 MED ORDER — DEXTROSE 5 % IV SOLN
Freq: Once | INTRAVENOUS | Status: AC
  Filled 2021-02-13: qty 250

## 2021-02-13 NOTE — Patient Instructions (Signed)
[facility]  Discharge Instructions: Thank you for choosing [facility] to provide your oncology and hematology care.   If you have a lab appointment with the [facility], please go directly to the [facility] and check in at the registration area.   Wear comfortable clothing and clothing appropriate for easy access to any Portacath or PICC line.   We strive to give you quality time with your provider. You may need to reschedule your appointment if you arrive late (15 or more minutes).  Arriving late affects you and other patients whose appointments are after yours.  Also, if you miss three or more appointments without notifying the office, you may be dismissed from the clinic at the provider's discretion.      For prescription refill requests, have your pharmacy contact our office and allow 72 hours for refills to be completed.    Today you received the following chemotherapy and/or immunotherapy agent: Fam-trastuzumab deruxtecan-nxki (Enhertu)   To help prevent nausea and vomiting after your treatment, we encourage you to take your nausea medication as directed.  BELOW ARE SYMPTOMS THAT SHOULD BE REPORTED IMMEDIATELY: *FEVER GREATER THAN 100.4 F (38 C) OR HIGHER *CHILLS OR SWEATING *NAUSEA AND VOMITING THAT IS NOT CONTROLLED WITH YOUR NAUSEA MEDICATION *UNUSUAL SHORTNESS OF BREATH *UNUSUAL BRUISING OR BLEEDING *URINARY PROBLEMS (pain or burning when urinating, or frequent urination) *BOWEL PROBLEMS (unusual diarrhea, constipation, pain near the anus) TENDERNESS IN MOUTH AND THROAT WITH OR WITHOUT PRESENCE OF ULCERS (sore throat, sores in mouth, or a toothache) UNUSUAL RASH, SWELLING OR PAIN  UNUSUAL VAGINAL DISCHARGE OR ITCHING   Items with * indicate a potential emergency and should be followed up as soon as possible or go to the Emergency Department if any problems should occur.  Please show the CHEMOTHERAPY ALERT CARD or  IMMUNOTHERAPY ALERT CARD at check-in to the Emergency Department and triage nurse.  Should you have questions after your visit or need to cancel or reschedule your appointment, please contact [facility]  Dept: [phone]  and follow the prompts.  Office hours are 8:00 a.m. to 4:30 p.m. Monday - Friday. Please note that voicemails left after 4:00 p.m. may not be returned until the following business day.  We are closed weekends and major holidays. You have access to a nurse at all times for urgent questions. Please call the main number to the clinic Dept: [phone] and follow the prompts.   For any non-urgent questions, you may also contact your provider using MyChart. We now offer e-Visits for anyone 77 and older to request care online for non-urgent symptoms. For details visit [email or web address].   Also download the MyChart app! Go to the app store, search "MyChart", open the app, select [facility], and log in with your MyChart username and password.  Due to Covid, a mask is required upon entering the hospital/clinic. If you do not have a mask, one will be given to you upon arrival. For doctor visits, patients may have 1 support person aged 5 or older with them. For treatment visits, patients cannot have anyone with them due to current Covid guidelines and our immunocompromised population.

## 2021-02-16 ENCOUNTER — Other Ambulatory Visit: Payer: Self-pay | Admitting: Medical

## 2021-02-16 ENCOUNTER — Other Ambulatory Visit (HOSPITAL_COMMUNITY): Payer: Self-pay

## 2021-02-16 ENCOUNTER — Ambulatory Visit: Payer: Medicare Other

## 2021-02-16 ENCOUNTER — Other Ambulatory Visit: Payer: Medicare Other

## 2021-02-16 ENCOUNTER — Ambulatory Visit: Payer: Medicare Other | Admitting: Hematology

## 2021-02-16 MED FILL — Prochlorperazine Maleate Tab 10 MG (Base Equivalent): ORAL | 7 days supply | Qty: 30 | Fill #0 | Status: AC

## 2021-02-18 ENCOUNTER — Other Ambulatory Visit: Payer: Self-pay | Admitting: Medical

## 2021-02-18 ENCOUNTER — Other Ambulatory Visit (HOSPITAL_COMMUNITY): Payer: Self-pay

## 2021-02-19 ENCOUNTER — Other Ambulatory Visit (HOSPITAL_COMMUNITY): Payer: Self-pay

## 2021-02-20 ENCOUNTER — Telehealth: Payer: Self-pay

## 2021-02-20 ENCOUNTER — Other Ambulatory Visit: Payer: Self-pay | Admitting: Hematology

## 2021-02-20 ENCOUNTER — Other Ambulatory Visit (HOSPITAL_COMMUNITY): Payer: Self-pay

## 2021-02-20 MED ORDER — LORAZEPAM 0.5 MG PO TABS
0.5000 mg | ORAL_TABLET | Freq: Three times a day (TID) | ORAL | 0 refills | Status: DC | PRN
  Filled 2021-02-20: qty 30, 10d supply, fill #0

## 2021-02-20 MED ORDER — MECLIZINE HCL 25 MG PO TABS
ORAL_TABLET | ORAL | 2 refills | Status: DC
Start: 2021-02-20 — End: 2021-06-19
  Filled 2021-02-20: qty 15, 10d supply, fill #0
  Filled 2021-03-10: qty 15, 10d supply, fill #1
  Filled 2021-04-01: qty 15, 10d supply, fill #2

## 2021-02-20 NOTE — Telephone Encounter (Signed)
This patient left a message stating that she has been having some nausea, denies vomiting.  She states that she is having truble with her right ear.  The ear feels like it has fluid in it.  Patient denies pain, fevers, chills.  She does have a slight headache and denies coming in contact with anyone positive  with Covid to her knowledge.  Patient also request to restart her Ativan stated her anxiety has increased some in the last few weeks.  This nurse informed patient that this information will be forwarded to the MD.

## 2021-02-20 NOTE — Telephone Encounter (Signed)
This nurse reached out to patient and informed per Dr. Burr Medico that nausea may be chemo related since the medication was recently adjusted.  Advised to continue with Zofran or Compazine.  Suggested drinking ginger ale to help settle stomach.  Advised per Dr. Burr Medico to see her PCP related to her ear discomfort.  Patient continues to deny ear pain, fever or drainage from ear.  Informed that Ativan refill was sent in to [facility].  Patient stated that she feels that she may need to speak to a Education officer, museum or counseling.  This advised that a request will be submitted for a social worker to contact her.  Patient acknowledged understanding of all recommendations and knows to call clinic with any further questions or concerns.

## 2021-02-21 ENCOUNTER — Other Ambulatory Visit (HOSPITAL_COMMUNITY): Payer: Self-pay

## 2021-02-23 ENCOUNTER — Other Ambulatory Visit (HOSPITAL_COMMUNITY): Payer: Self-pay

## 2021-02-25 ENCOUNTER — Encounter: Payer: Self-pay | Admitting: Hematology

## 2021-02-25 ENCOUNTER — Encounter: Payer: Self-pay | Admitting: General Practice

## 2021-02-25 NOTE — Progress Notes (Signed)
[facility] CSW Progress Notes  Called patient at request of Alferd Apa, RN. Patient requests to speak w social worker due to increased symptoms of depression.  Called patient, completed PHQ-9 - patient screens at "12."  Indicates difficulty with fatigue/lethargy, excessive sleep (sleeps 12+ hours/day), difficulty concentrating, loss of interest in things that used to be pleasurable, depressed mood. Prior to being diagnosed w metastatic cancer, she did not experience any kind of depression/anxiety or other mental health challenges.  Living with cancer has been difficult.  She has good support from her family - is currently staying with her daughter in [place] and helping care for her 44 month old granddaughter.  Family has remarked that she "seems depressed", urged her to contact oncologist office for referrals.  "I need help dealing with this."  In addition to advanced cancer, "I have also gotten myself into credit card troubles."  Overspending has created debt which is distressing.    Discussed options of referral for counseling and support group, both available virtually.  She is agreeable.  Requested Rn make referral to [facility] to be linked w health psychologist Conception Chancy.  Also added to list for Living with Cancer support group where she will be in touch with others living w metastatic disease.  CSW will call her next week to assess progress.  Edwyna Shell, LCSW Clinical Social Worker Phone:  [phone]

## 2021-02-26 ENCOUNTER — Other Ambulatory Visit: Payer: Self-pay

## 2021-02-26 ENCOUNTER — Encounter: Payer: Self-pay | Admitting: Hematology

## 2021-02-26 DIAGNOSIS — Z7189 Other specified counseling: Secondary | ICD-10-CM

## 2021-03-03 ENCOUNTER — Other Ambulatory Visit (HOSPITAL_COMMUNITY): Payer: Self-pay

## 2021-03-04 ENCOUNTER — Ambulatory Visit: Payer: Medicare Other

## 2021-03-04 NOTE — Progress Notes (Signed)
Nutrition Assessment   Reason for Assessment:  Referral from LCSW as patient with questions regarding nutrition   ASSESSMENT:  65 year old female with metastatic breast cancer.  Patient taking enhertu.  Spoke with patient via phone to answer questions and concerns about nutrition.  Patient with questions regarding alkaline diet. Says her daughter and her husband started the diet. Patient is living with daughter in [place] and says that they cook mushrooms in place of meat.  She does not like that.  Reports good appetite. Drinks ensure.    Medications: reviewed   Labs: reviewed   Anthropometrics:   Weight 150 lb 8 oz on 7/29  Stable overall   NUTRITION DIAGNOSIS: Food and nutrition related knowledge deficit related to cancer as evidenced by questions regarding what foods to eat    INTERVENTION:  Discussed current evidenced regarding alkaline diet and cancer.  Information mailed to patient regarding the diet and current research so she could share with daughter.   Discussed current recommendations from [employer] regarding nutrition and cancer.   Contact information given   Next Visit: no follow-up Patient to call with questions  Elon Eoff B. Zenia Resides, [place], Day Registered Dietitian [phone] (mobile)

## 2021-03-05 NOTE — Progress Notes (Signed)
[place]   Telephone:([phone] Fax:(336) [phone]   Clinic Follow up Note   Patient Care Team: [name], DO as PCP - General (Family Medicine) [name], MD as Consulting Physician (Gastroenterology) [name], MD as Consulting Physician (Hematology)  Date of Service:  03/06/2021  CHIEF COMPLAINT: f/u of metastatic breast cancer  SUMMARY OF ONCOLOGIC HISTORY: Oncology History Overview Note  Cancer Staging Metastatic breast cancer [facility]) Staging form: Breast, AJCC 8th Edition - Clinical stage from 03/24/2018: Stage IV (cT2, cN1, pM1, G3, ER+, PR+, HER2+) - Signed by [name], MD on 03/30/2018     Metastatic breast cancer ([place])  01/30/2018 Procedure   Colonoscopy showed small polyp in the sigmoid colon, removed, the exam of colon including the terminal ileum was otherwise negative.   01/30/2018 Procedure   EGD by Dr. Collene Mares showed small hiatal hernia, a 8 mm polypoid lesion in the cardia, biopsied.   03/09/2018 Imaging   03/09/2018 US Abdomen IMPRESSION: 1. Mass lesions throughout the liver, consistent with metastatic disease. Liver as a somewhat nodular contour suggesting underlying hepatic cirrhosis. Inhomogeneous echotexture to the liver.   2. Cholelithiasis with mild gallbladder wall thickening. A degree of cholecystitis cannot be excluded by ultrasound.   3. Portions of pancreas obscured by gas. Visualized portions of pancreas appear normal.   4. Small right kidney. Etiology uncertain. This finding potentially may be indicative of renal artery stenosis. In this regard, question whether patient is hypertensive.   03/15/2018 Imaging   CT CAP with contrast  IMPRESSION: 1. Widespread hepatic metastasis. 2. 2.6 cm lateral right breast soft tissue nodule could represent a breast primary or an incidental benign lesion. Consider correlation with mammogram and ultrasound. 3. No definite source of primary malignancy identified within the abdomen or  pelvis. There is possible rectosigmoid junction wall thickening. Consider colonoscopy with attention to this area. 4. Distal esophageal wall thickening, suggesting esophagitis.   03/24/2018 Cancer Staging   Staging form: Breast, AJCC 8th Edition - Clinical stage from 03/24/2018: Stage IV (cT2, cN1, pM1, G3, ER+, PR+, HER2+) - Signed by [name], MD on 03/30/2018   03/24/2018 Initial Biopsy   Diagnosis 1. 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 Breast, right, needle core biopsy, 9 o'clock, 7cm from nipple - INVASIVE DUCTAL CARCINOMA. -The carcinoma is somewhat morphologically dissimilar from that in part 1. It appears grade III 3. Lymph node, needle/core biopsy, right axillary - METASTATIC CARCINOMA IN 1 OF 1 LYMPH NODE (1/1).   03/24/2018 Receptors her2   Breast biopsy: 1. Estrogen Receptor: 40%, POSITIVE, STRONG-MODERATE STAINING INTENSITY Progesterone Receptor: 70%, POSITIVE, STRONG STAINING INTENSITY Proliferation Marker Ki67: 20% HER 2 equivocal by IHC 2+, POSITIVE by FISH, ratio 2.4 and copy #4.2  2. Estrogen Receptor: 60%, POSITIVE, MODERATE STAINING INTENSITY Progesterone Receptor: 40%, POSITIVE, MODERATE STAINING INTENSITY Proliferation Marker Ki67: 20% HER2 (+) by IHC 3+   03/24/2018 Initial Diagnosis   Metastatic breast cancer ([place])   03/27/2018 Pathology Results   Diagnosis Liver, needle/core biopsy, Right - METASTATIC CARCINOMA TO LIVER, CONSISTENT WITH PATIENTS CLINICAL HISTORY OF PRIMARY BREAST CARCINOMA.  ER 80%+ PR40%+ HER2- (by [facility], IHC 2+)  Ki67 50%    03/28/2018 Pathology Results   03/28/2018 Surgical Pathology Diagnosis 1. Breast, left, needle core biopsy, 9 o'clock - FIBROCYSTIC CHANGES. - THERE IS NO EVIDENCE OF MALIGNANCY. 2. Breast, left, needle core biopsy, 2 o'clock - FIBROADENOMA. - THERE IS  NO EVIDENCE OF MALIGNANCY. - SEE COMMENT.   03/29/2018 Imaging    03/29/2018 Bone Scan IMPRESSION: No scintigraphic evidence of osseous metastatic disease.   03/30/2018 Imaging   Bone scan  IMPRESSION: No scintigraphic evidence of osseous metastatic disease.    04/07/2018 - 07/30/2020 Chemotherapy   First line chemo weekly Taxol and herceptin/Perjeta every 3 weeks starting 04/07/18. She developed infusion reaction to taxol and it was discontinued. Added Abraxane on C1D8, 2 weeks on/1 week off.  Abraxne stopped after 10/13/18 due to worsening Neuropathy. She has continued with maintenance Herceptin injection/Perjeta q3weeks. Herceptin changed to injection on 04/06/19. Both injections switched to combination [place] on 10/01/19. ----Stopped 07/30/20 due to disease progression in liver.    06/06/2018 Imaging   CT CAP IMPRESSION: 1. Generally improved appearance, with reduced axillary adenopathy and reduced enhancing component of the hepatic masses, with some of the hepatic mass is moderately smaller than on the prior exam. Reduced size of the right lateral breast mass compared to the prior 03/15/2018 exam. 2. New mild interstitial accentuation in the lungs, significance uncertain. Part of this appearance may be due to lower lung volumes on today's exam. 3. Mild wall thickening in the descending colon and upper rectum suggesting low-grade colitis/inflammation. Prominent stool throughout the colon favors constipation. 4. Other imaging findings of potential clinical significance: Aortic Atherosclerosis (ICD10-I70.0). Mild cardiomegaly. Mild nodularity in the right lower lobe appears stable. Contracted and thick-walled gallbladder.     09/18/2018 Imaging   CT CAP W Contrast 09/18/18  IMPRESSION: 1. Liver metastases have decreased in size. 2. Right breast mass, mild right axillary lymphadenopathy and scattered tiny right pulmonary nodules are all stable. 3. New mild left supraclavicular and left subpectoral lymphadenopathy, can not exclude progression of metastatic  nodal disease. 4. Moderate colorectal stool volume, which may indicate constipation. 5.  Aortic Atherosclerosis (ICD10-I70.0).   10/2018 - 07/30/2020 Anti-estrogen oral therapy   Letrozole 2.5 mg daily starting 10/2018 D/c to proceed with Inhertu treatment after liver met progression.    01/10/2019 Imaging   CT CAP W Contrast 01/10/19  IMPRESSION: 1. Continued improvement in the hepatic metastatic lesions which have reduced in size. 2. Stable mild left supraclavicular and subpectoral adenopathy. 3. Essentially stable small right lower lobe pulmonary nodule and separate small subpleural nodule along the right hemidiaphragm. Surveillance suggested. 4. Other imaging findings of potential clinical significance: Mild cardiomegaly. Mild circumferential distal esophageal wall thickening, the most common cause would be esophagitis. Airway thickening is present, suggesting bronchitis or reactive airways disease. Airway plugging in the lower lobes and in the right middle lobe. Stable small right adrenal adenoma.   05/15/2019 Imaging   CT CAP W Contrast 05/15/19  IMPRESSION: CT CHEST IMPRESSION   1. Similar appearance of borderline supraclavicular, axillary, and subpectoral adenopathy. 2. Improved and resolved right lower lobe pulmonary nodularity. 3. Esophageal air fluid level suggests dysmotility or gastroesophageal reflux.   CT ABDOMEN AND PELVIS IMPRESSION   1. Improved hepatic metastasis. 2. Small bowel mesenteric lymph nodes which are upper normal and mildly enlarged. Likely increased and similar as detailed above. Indeterminate. Recommend attention on follow-up. 3. Cholelithiasis. 4. Motion degradation throughout the lower chest and abdomen.   09/19/2019 Imaging   CT CAP W contrast  IMPRESSION: 1. Interval decrease in size of right axillary and subpectoral lymph nodes. There are no pathologically enlarged lymph nodes remaining in the chest, abdomen, or pelvis. No new  lymphadenopathy. 2. No significant change in post treatment appearance of multiple low-attenuation liver lesions, in  keeping with treated metastases. 3. No evidence of new metastatic disease in the chest, abdomen, or pelvis. 4. Aortic Atherosclerosis (ICD10-I70.0).   11/16/2019 Imaging   IMRI Brain  MPRESSION: Minimal chronic microvascular ischemic changes. No acute intracranial process.   Active bilateral maxillary sinus disease with mild frontoethmoid mucosal thickening.   01/23/2020 Imaging   CT CAP W contrast  IMPRESSION: 1. Stable exam. No new or progressive interval findings. 2. Stable appearance of upper normal right axillary lymph nodes. Tiny subpectoral and left axillary nodes are unchanged. 3. Generally similar appearance of ill-defined hypoattenuating lesions in the liver compatible with treated metastases. 1 lesion in the central liver is minimally more conspicuous today, likely related to bolus timing and attention on follow-up recommended. No new suspicious liver lesion on today's study. 4. Stable 10 mm right adrenal nodule., indeterminate. Continued attention on follow-up imaging recommended. 5. Cholelithiasis. 6. Aortic Atherosclerosis (ICD10-I70.0).   02/11/2020 Breast MRI   IMPRESSION: 1. 7 millimeter focus of residual enhancement associated with the known malignancy in the 9 o'clock location of the RIGHT breast. 2. No significant enhancement in the known malignancy in the 11:30 o'clock location of the RIGHT breast. 3. Interval resolution of axillary adenopathy.   07/28/2020 Imaging   CT CAP  IMPRESSION: 1. Interval progression of hepatic metastases. 2. Stable indeterminate right adrenal nodule. 3. No new sites of metastatic disease in the chest, abdomen or pelvis. 4. Chronic findings include: Small hiatal hernia. Cholelithiasis. Aortic Atherosclerosis (ICD10-I70.0).     08/08/2020 -  Chemotherapy   Second-line Inhertu q3weeks starting 08/08/20     01/06/2021 Imaging   MRI Brain  IMPRESSION: No evidence of acute intracranial abnormality.   No evidence of intracranial metastatic disease.   Stable noncontrast MRI appearance of the brain as compared to 11/16/2019.   Mild chronic small vessel ischemic changes within the cerebral white matter.   Redemonstrated tiny chronic lacunar infarct within the right cerebellar hemisphere.   Mild paranasal sinus disease, as described      CURRENT THERAPY:  Second-line Enhertu q3weeks starting 08/08/20. Due to poor toleration with C1, she required dose reduction from C2.   INTERVAL HISTORY:  MASAKO OVERALL is here for a follow up of metastatic breast cancer. She was last seen by me on 02/13/21. She presents to the clinic alone. She reports feeling badly today-- she has an intense headache today that caused trouble sleeping. She reports some vision change, such as with trouble reading. She denies double vision. When asked where the headache is, she points to the area behind her left ear, "the same place it was before." She reports aside from the headache, she has been fine otherwise. She denies bowel issues, nausea.   All other systems were reviewed with the patient and are negative.  MEDICAL HISTORY:  Past Medical History:  Diagnosis Date   GERD (gastroesophageal reflux disease)    Hypertension    Personal history of chemotherapy    rt breast ca with mets to liver dx'd 03/2018    SURGICAL HISTORY: Past Surgical History:  Procedure Laterality Date   BREAST BIOPSY Right 03/24/2018   CA x3   BREAST BIOPSY Left 03/28/2018   neg   BREAST BIOPSY Left 03/30/2018   neg   COLONOSCOPY     ESOPHAGOGASTRODUODENOSCOPY ENDOSCOPY     IR IMAGING GUIDED PORT INSERTION  04/04/2018    I have reviewed the social history and family history with the patient and they are unchanged from previous note.  ALLERGIES:  has No Known Allergies.  MEDICATIONS:  Current Outpatient Medications   Medication Sig Dispense Refill   amLODipine (NORVASC) 5 MG tablet TAKE 1 TABLET BY MOUTH ONCE DAILY 30 tablet 2   Budeson-Glycopyrrol-Formoterol 160-9-4.8 MCG/ACT AERO INHALE 2 PUFFS BY MOUTH INTO THE LUNGS IN THE MORNING AND AT BEDTIME. 10.7 g 6   carvedilol (COREG) 3.125 MG tablet TAKE 1 TABLET BY MOUTH TWICE DAILY. REPLACES ATENOLOL 180 tablet 3   diphenoxylate-atropine (LOMOTIL) 2.5-0.025 MG tablet Take 1-2 tablets by mouth 4 (four) times daily as needed for diarrhea or loose stools. 30 tablet 0   DULoxetine (CYMBALTA) 20 MG capsule TAKE 2 CAPSULES BY MOUTH DAILY 60 capsule 3   gabapentin (NEURONTIN) 300 MG capsule MAY TAKE UP TO 3 CAPSULES THREE TIMES DAILY BY MOUTH 270 capsule 3   lidocaine (XYLOCAINE) 2 % jelly APPLY OVER PORT A CATH TOPICALLY 1 HOUR BEFORE PORT BEING ACCESSED AS NEEDED 30 mL 2   LORazepam (ATIVAN) 0.5 MG tablet Take 1 tablet (0.5 mg total) by mouth every 8 (eight) hours as needed for anxiety. 30 tablet 0   losartan (COZAAR) 50 MG tablet Take 2 tablets (100 mg total) by mouth daily. 180 tablet 3   meclizine (ANTIVERT) 25 MG tablet TAKE 1/2 TABLET BY MOUTH 3 TIMES DAILY AS NEEDED FOR DIZZINESS 15 tablet 2   montelukast (SINGULAIR) 10 MG tablet TAKE 1 TABLET (10 MG TOTAL) BY MOUTH AT BEDTIME. 30 tablet 5   omeprazole (PRILOSEC) 40 MG capsule TAKE 1 CAPSULE BY MOUTH 2 TIMES DAILY 60 capsule 3   ondansetron (ZOFRAN) 8 MG tablet TAKE 1 TABLET BY MOUTH EVERY 8 HOURS AS NEEDED FOR NAUSEA OR VOMITING 30 tablet 3   potassium chloride SA (KLOR-CON) 20 MEQ tablet TAKE 1 TABLET BY MOUTH ONCE A DAY 30 tablet 3   prochlorperazine (COMPAZINE) 10 MG tablet TAKE 1 TABLET BY MOUTH EVERY 6 HOURS AS NEEDED FOR NAUSEA OR VOMITING 30 tablet 3   rivaroxaban (XARELTO) 20 MG TABS tablet TAKE 1 TABLET BY MOUTH DAILY WITH SUPPER 30 tablet 5   spironolactone (ALDACTONE) 25 MG tablet TAKE 1 TABLET BY MOUTH ONCE DAILY 90 tablet 3   traMADol (ULTRAM) 50 MG tablet Take 1 tablet (50 mg total) by mouth  every 6 (six) hours as needed for moderate pain or severe pain. 30 tablet 0   Zoster Vaccine Adjuvanted (SHINGRIX) injection INJECT 0.5 MLS INTO THE MUSCLE ONCE FOR 1 DOSE. 1 each 1   No current facility-administered medications for this visit.   Facility-Administered Medications Ordered in Other Visits  Medication Dose Route Frequency Provider Last Rate Last Admin   acetaminophen (TYLENOL) tablet 650 mg  650 mg Oral Once [name], MD       dexamethasone (DECADRON) 10 mg in sodium chloride 0.9 % 50 mL IVPB  10 mg Intravenous Once [name], MD       diphenhydrAMINE (BENADRYL) capsule 50 mg  50 mg Oral Once [name], MD       fam-trastuzumab deruxtecan-nxki [facility]) 332 mg in dextrose 5 % 100 mL chemo infusion  5 mg/kg (Treatment Plan Recorded) Intravenous Once [name], MD       heparin lock flush 100 unit/mL  500 Units Intracatheter Once PRN [name], MD       palonosetron (ALOXI) injection 0.25 mg  0.25 mg Intravenous Once [name], MD       sodium chloride flush (NS) 0.9 % injection 10 mL  10 mL Intracatheter PRN [name],  MD   10 mL at 05/28/20 0935   sodium chloride flush (NS) 0.9 % injection 10 mL  10 mL Intracatheter PRN [name], MD        PHYSICAL EXAMINATION: ECOG PERFORMANCE STATUS: 1 - Symptomatic but completely ambulatory  Vitals:   03/06/21 0857  BP: 102/72  Pulse: 78  Resp: 20  Temp: 98.7 F (37.1 C)  SpO2: 99%   Wt Readings from Last 3 Encounters:  03/06/21 150 lb (68 kg)  02/13/21 150 lb 8 oz (68.3 kg)  01/23/21 149 lb 11.2 oz (67.9 kg)     GENERAL:alert, no distress and comfortable SKIN: skin color normal, no rashes or significant lesions EYES: normal, Conjunctiva are pink and non-injected, sclera clear  NEURO: alert & oriented x 3 with fluent speech  LABORATORY DATA:  I have reviewed the data as listed CBC Latest Ref Rng & Units 03/06/2021 02/13/2021 01/23/2021  WBC 4.0 - 10.5 K/uL 5.3 4.6 4.8  Hemoglobin 12.0 - 15.0 g/dL 10.0(L) 9.4(L) 9.4(L)   Hematocrit 36.0 - 46.0 % 31.1(L) 29.2(L) 28.8(L)  Platelets 150 - 400 K/uL 202 218 210     CMP Latest Ref Rng & Units 03/06/2021 02/13/2021 01/23/2021  Glucose 70 - 99 mg/dL 123(H) 121(H) 99  BUN 8 - 23 mg/dL _0 Creatinine 0.44 - 1.00 mg/dL 1.57(H) 1.38(H) 1.25(H)  Sodium 135 - 145 mmol/L 136 138 136  Potassium 3.5 - 5.1 mmol/L 4.4 4.2 4.5  Chloride 98 - 111 mmol/L 103 105 103  CO2 22 - 32 mmol/L _1 Calcium 8.9 - 10.3 mg/dL 9.9 9.5 9.4  Total Protein 6.5 - 8.1 g/dL 6.9 6.5 6.7  Total Bilirubin 0.3 - 1.2 mg/dL 0.5 0.4 0.3  Alkaline Phos 38 - 126 U/L 276(H) 301(H) 288(H)  AST 15 - 41 U/L 49(H) 46(H) 67(H)  ALT 0 - 44 U/L 26 33 56(H)      RADIOGRAPHIC STUDIES: I have personally reviewed the radiological images as listed and agreed with the findings in the report. No results found.   ASSESSMENT & PLAN:  ALISHEA BEAUDIN is a 65 y.o. female with   1. 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 She presented with diffuse liver metastasis, two right breast masses and right axillary adenopathy. Breast mass biopsy showed ER PR positive, but 1 was HER-2 positive, the other one was HER-2 negative. Liver met was HER2-. -Given the metastatic disease, her cancer is not curable but still treatable.  -She had been treated with first line maintenance treatment with letrozole, trastuzumab and Perjeta, until disease progression on 07/28/20 CT CAP. -I started her on second-line Enhertu q3weeks starting 08/08/20. Given poor toleration with C1 (nausea, diarrhea, poor appetite), dose reduced with C2 and she tolerated much better. Her restaging CT from 11/20/20 showed partial response.  -Her Ca 27.29 has been slowly going up for the last 2-3 months. Because of this, I recommend we slowly increase her dose of Enhertu to see how she tolerates.she tolerated increased dose last week well  -Labs reviewed, adequate to  proceed with treatment, same dose. -f/u in 3 weeks before next cycle  -plan to repeat scan after Labor Day weekend, prior to our visit on 03/27/21.   2. Syncope, HTN -During week 1 of C2 Enhertu she notes episode of syncope.  -She had another episode about a week after C9. Head CT and MRI w/o contrast on 01/29/21 were negative. -I  recommended she take her BP at home. If <100, I recommend she hold amlodipine.   3. Peripheral neuropathy, grade 2, Right Foot pain, Secondary to chemotherapy -I previously stropped Abraxane on 10/13/18.  -She will also continue OTC Calcium and Vit D, Oral B12, Gabapentin 381m 3 tabs TID and Cymbalta to 435m Tramadol every other night for right foot pain. Well controlled.   -not mentioned today (03/06/21)   4. Diarrhea -occurs about every 3 days -was previously on lomotil. She has not tried imodium. -not mentioned 02/13/21   5. 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 Anemia, Secondary to chemo -Continue monitoring, will consider blood transfusion if hemoglobin less than 8. -On Enhertu, her anemia has worsened and moderate, but stable.  -Hgb stable, 10 today (03/06/21)   7. Goal of Care Discussion, Social support -She lives alone, [place] daughter and her family. -On Ativan PRN due to anxiety about diagnosis and treatment. She is working with our socEducation officer, museum connect with a couSocial workerThe patient understands the goal of care is palliative. -She is full code for now    8. Asthma  -This is secondary to post nasal drip and her Acid reflux, per Dr. IcaValeta Harmsill continue Prilosec 2 tabs BID, allergy medication and nasal spray and inhalers. Her breathing has improved and adequate now.   -Continue to f/u with Dr IcaValeta Harmsd Dr ManCollene Mares   9. Left headache   -intermittent, Brain MRI 01/06/21 was negative for metastatic disease or acute intracranial  abnormality. -She has had recurrent posterior left headaches. She takes tylenol for relief. I advised she can take low dose ibuprofen but to use it sparingly due to being on Xarelto. -I discussed this could be related to nerve compression in that area, and that it will resolve on its own after several days up to a week.     Plan: -proceed with C11 Enhertu today at same dose 5 mg/kg -lab, f/u, and Enhertu in 3 and 6 weeks, restaging CT before next visit in 3 weeks    No problem-specific Assessment & Plan notes found for this encounter.   No orders of the defined types were placed in this encounter.  All questions were answered. The patient knows to call the clinic with any problems, questions or concerns. No barriers to learning was detected. The total time spent in the appointment was 30 minutes.     YanTruitt MerleD 03/06/2021   I, KatWilburn Mylarm acting as scribe for YanTruitt MerleD.   I have reviewed the above documentation for accuracy and completeness, and I agree with the above.

## 2021-03-06 ENCOUNTER — Inpatient Hospital Stay (HOSPITAL_BASED_OUTPATIENT_CLINIC_OR_DEPARTMENT_OTHER): Payer: Medicare Other | Admitting: Hematology

## 2021-03-06 ENCOUNTER — Other Ambulatory Visit: Payer: Self-pay

## 2021-03-06 ENCOUNTER — Telehealth: Payer: Self-pay | Admitting: Hematology

## 2021-03-06 ENCOUNTER — Encounter: Payer: Self-pay | Admitting: Hematology

## 2021-03-06 ENCOUNTER — Inpatient Hospital Stay: Payer: Medicare Other

## 2021-03-06 ENCOUNTER — Inpatient Hospital Stay: Payer: Medicare Other | Attending: Hematology

## 2021-03-06 ENCOUNTER — Encounter: Payer: Self-pay | Admitting: General Practice

## 2021-03-06 ENCOUNTER — Inpatient Hospital Stay: Payer: Medicare Other | Admitting: General Practice

## 2021-03-06 VITALS — BP 102/72 | HR 78 | Temp 98.7°F | Resp 20 | Wt 150.0 lb

## 2021-03-06 DIAGNOSIS — C50919 Malignant neoplasm of unspecified site of unspecified female breast: Secondary | ICD-10-CM

## 2021-03-06 DIAGNOSIS — I700 Atherosclerosis of aorta: Secondary | ICD-10-CM | POA: Diagnosis not present

## 2021-03-06 DIAGNOSIS — G620 Drug-induced polyneuropathy: Secondary | ICD-10-CM | POA: Insufficient documentation

## 2021-03-06 DIAGNOSIS — C787 Secondary malignant neoplasm of liver and intrahepatic bile duct: Secondary | ICD-10-CM | POA: Insufficient documentation

## 2021-03-06 DIAGNOSIS — R519 Headache, unspecified: Secondary | ICD-10-CM | POA: Insufficient documentation

## 2021-03-06 DIAGNOSIS — J45909 Unspecified asthma, uncomplicated: Secondary | ICD-10-CM | POA: Insufficient documentation

## 2021-03-06 DIAGNOSIS — M79671 Pain in right foot: Secondary | ICD-10-CM | POA: Diagnosis not present

## 2021-03-06 DIAGNOSIS — I10 Essential (primary) hypertension: Secondary | ICD-10-CM | POA: Diagnosis not present

## 2021-03-06 DIAGNOSIS — T451X5A Adverse effect of antineoplastic and immunosuppressive drugs, initial encounter: Secondary | ICD-10-CM | POA: Insufficient documentation

## 2021-03-06 DIAGNOSIS — C773 Secondary and unspecified malignant neoplasm of axilla and upper limb lymph nodes: Secondary | ICD-10-CM | POA: Diagnosis not present

## 2021-03-06 DIAGNOSIS — Z5112 Encounter for antineoplastic immunotherapy: Secondary | ICD-10-CM | POA: Diagnosis present

## 2021-03-06 DIAGNOSIS — Z7189 Other specified counseling: Secondary | ICD-10-CM

## 2021-03-06 DIAGNOSIS — Z79899 Other long term (current) drug therapy: Secondary | ICD-10-CM | POA: Insufficient documentation

## 2021-03-06 DIAGNOSIS — D6481 Anemia due to antineoplastic chemotherapy: Secondary | ICD-10-CM | POA: Diagnosis not present

## 2021-03-06 DIAGNOSIS — R55 Syncope and collapse: Secondary | ICD-10-CM | POA: Insufficient documentation

## 2021-03-06 DIAGNOSIS — C50811 Malignant neoplasm of overlapping sites of right female breast: Secondary | ICD-10-CM | POA: Diagnosis present

## 2021-03-06 DIAGNOSIS — Z95828 Presence of other vascular implants and grafts: Secondary | ICD-10-CM

## 2021-03-06 DIAGNOSIS — Z170 Estrogen receptor positive status [ER+]: Secondary | ICD-10-CM | POA: Insufficient documentation

## 2021-03-06 LAB — CBC WITH DIFFERENTIAL (CANCER CENTER ONLY)
Abs Immature Granulocytes: 0.01 10*3/uL (ref 0.00–0.07)
Basophils Absolute: 0.1 10*3/uL (ref 0.0–0.1)
Basophils Relative: 1 %
Eosinophils Absolute: 0.2 10*3/uL (ref 0.0–0.5)
Eosinophils Relative: 4 %
HCT: 31.1 % — ABNORMAL LOW (ref 36.0–46.0)
Hemoglobin: 10 g/dL — ABNORMAL LOW (ref 12.0–15.0)
Immature Granulocytes: 0 %
Lymphocytes Relative: 25 %
Lymphs Abs: 1.3 10*3/uL (ref 0.7–4.0)
MCH: 29.2 pg (ref 26.0–34.0)
MCHC: 32.2 g/dL (ref 30.0–36.0)
MCV: 90.9 fL (ref 80.0–100.0)
Monocytes Absolute: 0.4 10*3/uL (ref 0.1–1.0)
Monocytes Relative: 8 %
Neutro Abs: 3.3 10*3/uL (ref 1.7–7.7)
Neutrophils Relative %: 62 %
Platelet Count: 202 10*3/uL (ref 150–400)
RBC: 3.42 MIL/uL — ABNORMAL LOW (ref 3.87–5.11)
RDW: 15.7 % — ABNORMAL HIGH (ref 11.5–15.5)
WBC Count: 5.3 10*3/uL (ref 4.0–10.5)
nRBC: 0 % (ref 0.0–0.2)

## 2021-03-06 LAB — CMP (CANCER CENTER ONLY)
ALT: 26 U/L (ref 0–44)
AST: 49 U/L — ABNORMAL HIGH (ref 15–41)
Albumin: 3.4 g/dL — ABNORMAL LOW (ref 3.5–5.0)
Alkaline Phosphatase: 276 U/L — ABNORMAL HIGH (ref 38–126)
Anion gap: 11 (ref 5–15)
BUN: 15 mg/dL (ref 8–23)
CO2: 22 mmol/L (ref 22–32)
Calcium: 9.9 mg/dL (ref 8.9–10.3)
Chloride: 103 mmol/L (ref 98–111)
Creatinine: 1.57 mg/dL — ABNORMAL HIGH (ref 0.44–1.00)
GFR, Estimated: 37 mL/min — ABNORMAL LOW (ref >60)
Glucose, Bld: 123 mg/dL — ABNORMAL HIGH (ref 70–99)
Potassium: 4.4 mmol/L (ref 3.5–5.1)
Sodium: 136 mmol/L (ref 135–145)
Total Bilirubin: 0.5 mg/dL (ref 0.3–1.2)
Total Protein: 6.9 g/dL (ref 6.5–8.1)

## 2021-03-06 MED ORDER — ACETAMINOPHEN 325 MG PO TABS
650.0000 mg | ORAL_TABLET | Freq: Once | ORAL | Status: AC
Start: 2021-03-06 — End: 2021-03-06
  Administered 2021-03-06: 650 mg via ORAL
  Filled 2021-03-06: qty 2

## 2021-03-06 MED ORDER — SODIUM CHLORIDE 0.9 % IV SOLN
10.0000 mg | Freq: Once | INTRAVENOUS | Status: AC
  Administered 2021-03-06: 10 mg via INTRAVENOUS
  Filled 2021-03-06: qty 10

## 2021-03-06 MED ORDER — DEXTROSE 5 % IV SOLN: Freq: Once | INTRAVENOUS | Status: AC

## 2021-03-06 MED ORDER — SODIUM CHLORIDE 0.9% FLUSH
10.0000 mL | INTRAVENOUS | Status: DC | PRN
  Administered 2021-03-06: 10 mL

## 2021-03-06 MED ORDER — PALONOSETRON HCL INJECTION 0.25 MG/5ML
0.2500 mg | Freq: Once | INTRAVENOUS | Status: AC
Start: 2021-03-06 — End: 2021-03-06
  Administered 2021-03-06: 0.25 mg via INTRAVENOUS
  Filled 2021-03-06: qty 5

## 2021-03-06 MED ORDER — HEPARIN SOD (PORK) LOCK FLUSH 100 UNIT/ML IV SOLN
500.0000 [IU] | Freq: Once | INTRAVENOUS | Status: AC | PRN
  Administered 2021-03-06: 500 [IU]

## 2021-03-06 MED ORDER — FAM-TRASTUZUMAB DERUXTECAN-NXKI CHEMO 100 MG IV SOLR
5.0000 mg/kg | Freq: Once | INTRAVENOUS | Status: AC
  Administered 2021-03-06: 332 mg via INTRAVENOUS
  Filled 2021-03-06: qty 16.6

## 2021-03-06 MED ORDER — DIPHENHYDRAMINE HCL 25 MG PO CAPS
50.0000 mg | ORAL_CAPSULE | Freq: Once | ORAL | Status: AC
  Administered 2021-03-06: 50 mg via ORAL
  Filled 2021-03-06: qty 2

## 2021-03-06 NOTE — Telephone Encounter (Signed)
Scheduled follow-up appointment per 8/19 los. Patient is aware.

## 2021-03-06 NOTE — Progress Notes (Signed)
[place] [facility] CSW Progress Notes  Met w patient in infusion, she would still like to see Dr Michail Sermon for counseling.  Spoke w provider and [facility] referral coordinator - she needs to complete required paperwork prior to scheduling.  Per referral coordinator, this has been sent but patient has not received it x2.  CSW printed off paperwork and gave to patient so she can complete/fax from home.  Also sent referral coodinator patient's email address so this can be double checked as correct.  Will follow up w patient in 1 week to assess progress towards linking her to services.  Edwyna Shell, LCSW Clinical Social Worker Phone:  [phone]

## 2021-03-06 NOTE — Addendum Note (Signed)
Addended by: Jettie Booze E on: 03/06/2021 12:16 PM   Modules accepted: Orders

## 2021-03-06 NOTE — Progress Notes (Signed)
Ok to treat with elevated scr.

## 2021-03-06 NOTE — Patient Instructions (Signed)
[facility] ONCOLOGY  Discharge Instructions: Thank you for choosing [facility] to provide your oncology and hematology care.   If you have a lab appointment with the [facility], please go directly to the [facility] and check in at the registration area.   Wear comfortable clothing and clothing appropriate for easy access to any Portacath or PICC line.   We strive to give you quality time with your provider. You may need to reschedule your appointment if you arrive late (15 or more minutes).  Arriving late affects you and other patients whose appointments are after yours.  Also, if you miss three or more appointments without notifying the office, you may be dismissed from the clinic at the provider's discretion.      For prescription refill requests, have your pharmacy contact our office and allow 72 hours for refills to be completed.    Today you received the following chemotherapy and/or immunotherapy agent: Fam-trastuzumab deruxtecan-nxki ([place])   To help prevent nausea and vomiting after your treatment, we encourage you to take your nausea medication as directed.  BELOW ARE SYMPTOMS THAT SHOULD BE REPORTED IMMEDIATELY: *FEVER GREATER THAN 100.4 F (38 C) OR HIGHER *CHILLS OR SWEATING *NAUSEA AND VOMITING THAT IS NOT CONTROLLED WITH YOUR NAUSEA MEDICATION *UNUSUAL SHORTNESS OF BREATH *UNUSUAL BRUISING OR BLEEDING *URINARY PROBLEMS (pain or burning when urinating, or frequent urination) *BOWEL PROBLEMS (unusual diarrhea, constipation, pain near the anus) TENDERNESS IN MOUTH AND THROAT WITH OR WITHOUT PRESENCE OF ULCERS (sore throat, sores in mouth, or a toothache) UNUSUAL RASH, SWELLING OR PAIN  UNUSUAL VAGINAL DISCHARGE OR ITCHING   Items with * indicate a potential emergency and should be followed up as soon as possible or go to the Emergency Department if any problems should occur.  Please show the CHEMOTHERAPY ALERT CARD or  IMMUNOTHERAPY ALERT CARD at check-in to the Emergency Department and triage nurse.  Should you have questions after your visit or need to cancel or reschedule your appointment, please contact [facility]  Dept: [phone]  and follow the prompts.  Office hours are 8:00 a.m. to 4:30 p.m. Monday - Friday. Please note that voicemails left after 4:00 p.m. may not be returned until the following business day.  We are closed weekends and major holidays. You have access to a nurse at all times for urgent questions. Please call the main number to the clinic Dept: [phone] and follow the prompts.   For any non-urgent questions, you may also contact your provider using MyChart. We now offer e-Visits for anyone 28 and older to request care online for non-urgent symptoms. For details visit [email or web address].   Also download the MyChart app! Go to the app store, search "MyChart", open the app, select [facility], and log in with your MyChart username and password.  Due to Covid, a mask is required upon entering the hospital/clinic. If you do not have a mask, one will be given to you upon arrival. For doctor visits, patients may have 1 support person aged 70 or older with them. For treatment visits, patients cannot have anyone with them due to current Covid guidelines and our immunocompromised population.

## 2021-03-07 LAB — CANCER ANTIGEN 27.29: CA 27.29: 364 U/mL — ABNORMAL HIGH (ref 0.0–38.6)

## 2021-03-10 ENCOUNTER — Other Ambulatory Visit: Payer: Self-pay

## 2021-03-10 ENCOUNTER — Other Ambulatory Visit (HOSPITAL_COMMUNITY): Payer: Self-pay

## 2021-03-10 DIAGNOSIS — F419 Anxiety disorder, unspecified: Secondary | ICD-10-CM

## 2021-03-10 DIAGNOSIS — Z7189 Other specified counseling: Secondary | ICD-10-CM

## 2021-03-10 MED FILL — Budesonide-Glycopyrrolate-Formoterol Aers 160-9-4.8 MCG/ACT: RESPIRATORY_TRACT | 30 days supply | Qty: 10.7 | Fill #3 | Status: AC

## 2021-03-11 ENCOUNTER — Other Ambulatory Visit (HOSPITAL_COMMUNITY): Payer: Self-pay

## 2021-03-13 ENCOUNTER — Inpatient Hospital Stay: Payer: Medicare Other | Admitting: General Practice

## 2021-03-13 DIAGNOSIS — C50919 Malignant neoplasm of unspecified site of unspecified female breast: Secondary | ICD-10-CM

## 2021-03-13 NOTE — Progress Notes (Signed)
[facility] CSW Progress Notes  Call to patient to follow up on progress of referral.  "I am having trouble filling their paperwork out, I cannot open it."  She has received paperwork by email and CSW provided a printed copy - she has not been able to complete the electronic paperwork and has not returned the paper version.  Ensured she has the required referral paperwork.  She will ask for help from her children in completing this.  She is having difficulty w blurry vision, this is complicating the process of getting paperwork in place and returned to [facility] referral coordinator.  She will let CSW know if she experiences any more difficulties    Edwyna Shell, [facility] Worker Phone:  [phone]

## 2021-03-16 ENCOUNTER — Encounter: Payer: Self-pay | Admitting: Hematology

## 2021-03-16 ENCOUNTER — Other Ambulatory Visit (HOSPITAL_COMMUNITY): Payer: Self-pay

## 2021-03-16 ENCOUNTER — Telehealth: Payer: Self-pay

## 2021-03-16 NOTE — Telephone Encounter (Addendum)
Nutrition  Patient called and left RD message with questions regarding materials that were mailed.    Called patient this pm.  No answer.  Left message with call back number.  Patient was able to return RD's call later this afternoon ~4pm. Patient wanting information on foods to eat during treatment.  Emailed handout on [employer] and Nutrition During Cancer Treatment.  Encouraged patient to visit [email or web address] for recipes and more information.    Hiroki Wint B. Zenia Resides, [place], [facility] Registered Dietitian [phone] (mobile)

## 2021-03-17 ENCOUNTER — Other Ambulatory Visit: Payer: Self-pay | Admitting: Nurse Practitioner

## 2021-03-17 DIAGNOSIS — C50919 Malignant neoplasm of unspecified site of unspecified female breast: Secondary | ICD-10-CM

## 2021-03-17 DIAGNOSIS — C787 Secondary malignant neoplasm of liver and intrahepatic bile duct: Secondary | ICD-10-CM

## 2021-03-18 ENCOUNTER — Other Ambulatory Visit (HOSPITAL_COMMUNITY): Payer: Self-pay

## 2021-03-19 ENCOUNTER — Other Ambulatory Visit: Payer: Self-pay | Admitting: Hematology

## 2021-03-20 ENCOUNTER — Encounter: Payer: Self-pay | Admitting: Hematology

## 2021-03-21 ENCOUNTER — Encounter: Payer: Self-pay | Admitting: Hematology

## 2021-03-21 ENCOUNTER — Other Ambulatory Visit (HOSPITAL_COMMUNITY): Payer: Self-pay

## 2021-03-24 ENCOUNTER — Other Ambulatory Visit: Payer: Self-pay

## 2021-03-24 ENCOUNTER — Ambulatory Visit (HOSPITAL_COMMUNITY): Payer: Medicare Other

## 2021-03-24 ENCOUNTER — Other Ambulatory Visit (HOSPITAL_COMMUNITY): Payer: Self-pay

## 2021-03-24 ENCOUNTER — Ambulatory Visit (HOSPITAL_COMMUNITY)
Admission: RE | Admit: 2021-03-24 | Discharge: 2021-03-24 | Disposition: A | Discharge status: Home / Self Care | Payer: Medicare Other | Source: Ambulatory Visit | Attending: Nurse Practitioner | Admitting: Nurse Practitioner

## 2021-03-24 ENCOUNTER — Encounter (HOSPITAL_COMMUNITY): Payer: Self-pay

## 2021-03-24 DIAGNOSIS — C787 Secondary malignant neoplasm of liver and intrahepatic bile duct: Secondary | ICD-10-CM | POA: Diagnosis present

## 2021-03-24 DIAGNOSIS — C50919 Malignant neoplasm of unspecified site of unspecified female breast: Secondary | ICD-10-CM | POA: Diagnosis present

## 2021-03-24 MED ORDER — IOHEXOL 350 MG/ML SOLN
100.0000 mL | Freq: Once | INTRAVENOUS | Status: AC | PRN
  Administered 2021-03-24: 60 mL via INTRAVENOUS

## 2021-03-24 MED ORDER — HEPARIN SOD (PORK) LOCK FLUSH 100 UNIT/ML IV SOLN
INTRAVENOUS | Status: AC
  Filled 2021-03-24: qty 5

## 2021-03-24 MED ORDER — HEPARIN SOD (PORK) LOCK FLUSH 100 UNIT/ML IV SOLN
500.0000 [IU] | Freq: Once | INTRAVENOUS | Status: AC
  Administered 2021-03-24: 500 [IU] via INTRAVENOUS

## 2021-03-26 ENCOUNTER — Other Ambulatory Visit: Payer: Self-pay | Admitting: Hematology

## 2021-03-26 MED FILL — Dexamethasone Sodium Phosphate Inj 100 MG/10ML: INTRAMUSCULAR | Qty: 1 | Status: AC

## 2021-03-26 NOTE — Progress Notes (Signed)
DISCONTINUE OFF PATHWAY REGIMEN - Breast   [id number]:Fam-trastuzumab deruxtecan-nxki 5.4 mg/kg IV D1 q21 Days:   A cycle is every 21 days:     Fam-trastuzumab deruxtecan-nxki   **Always confirm dose/schedule in your pharmacy ordering system**  REASON: Disease Progression PRIOR TREATMENT: Off Pathway: Fam-trastuzumab deruxtecan-nxki 5.4 mg/kg IV D1 q21 Days TREATMENT RESPONSE: Partial Response (PR)  START ON PATHWAY REGIMEN - Breast     Cycle 1: A cycle is 21 days:     Capecitabine      Tucatinib      Trastuzumab-xxxx    Cycles 2 and beyond: A cycle is every 21 days:     Capecitabine      Tucatinib      Trastuzumab-xxxx   **Always confirm dose/schedule in your pharmacy ordering system**  Patient Characteristics: 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

## 2021-03-27 ENCOUNTER — Inpatient Hospital Stay: Payer: Medicare Other | Attending: Hematology | Admitting: Hematology

## 2021-03-27 ENCOUNTER — Encounter: Payer: Self-pay | Admitting: Hematology

## 2021-03-27 ENCOUNTER — Telehealth: Payer: Self-pay | Admitting: Pharmacist

## 2021-03-27 ENCOUNTER — Inpatient Hospital Stay: Payer: Medicare Other

## 2021-03-27 ENCOUNTER — Other Ambulatory Visit: Payer: Self-pay

## 2021-03-27 ENCOUNTER — Other Ambulatory Visit (HOSPITAL_COMMUNITY): Payer: Self-pay

## 2021-03-27 VITALS — BP 111/77 | HR 81 | Temp 98.4°F | Resp 17 | Ht 64.0 in | Wt 150.0 lb

## 2021-03-27 DIAGNOSIS — C50919 Malignant neoplasm of unspecified site of unspecified female breast: Secondary | ICD-10-CM | POA: Diagnosis not present

## 2021-03-27 DIAGNOSIS — K449 Diaphragmatic hernia without obstruction or gangrene: Secondary | ICD-10-CM | POA: Diagnosis not present

## 2021-03-27 DIAGNOSIS — Z23 Encounter for immunization: Secondary | ICD-10-CM | POA: Diagnosis not present

## 2021-03-27 DIAGNOSIS — N189 Chronic kidney disease, unspecified: Secondary | ICD-10-CM | POA: Insufficient documentation

## 2021-03-27 DIAGNOSIS — Z79811 Long term (current) use of aromatase inhibitors: Secondary | ICD-10-CM | POA: Diagnosis not present

## 2021-03-27 DIAGNOSIS — J45909 Unspecified asthma, uncomplicated: Secondary | ICD-10-CM | POA: Insufficient documentation

## 2021-03-27 DIAGNOSIS — C787 Secondary malignant neoplasm of liver and intrahepatic bile duct: Secondary | ICD-10-CM | POA: Insufficient documentation

## 2021-03-27 DIAGNOSIS — Z79899 Other long term (current) drug therapy: Secondary | ICD-10-CM | POA: Diagnosis not present

## 2021-03-27 DIAGNOSIS — M79671 Pain in right foot: Secondary | ICD-10-CM | POA: Insufficient documentation

## 2021-03-27 DIAGNOSIS — C50811 Malignant neoplasm of overlapping sites of right female breast: Secondary | ICD-10-CM | POA: Diagnosis present

## 2021-03-27 DIAGNOSIS — G629 Polyneuropathy, unspecified: Secondary | ICD-10-CM | POA: Insufficient documentation

## 2021-03-27 DIAGNOSIS — Z95828 Presence of other vascular implants and grafts: Secondary | ICD-10-CM

## 2021-03-27 DIAGNOSIS — F419 Anxiety disorder, unspecified: Secondary | ICD-10-CM | POA: Insufficient documentation

## 2021-03-27 DIAGNOSIS — Z170 Estrogen receptor positive status [ER+]: Secondary | ICD-10-CM | POA: Insufficient documentation

## 2021-03-27 DIAGNOSIS — N270 Small kidney, unilateral: Secondary | ICD-10-CM | POA: Diagnosis not present

## 2021-03-27 DIAGNOSIS — R55 Syncope and collapse: Secondary | ICD-10-CM | POA: Insufficient documentation

## 2021-03-27 DIAGNOSIS — Z86718 Personal history of other venous thrombosis and embolism: Secondary | ICD-10-CM | POA: Diagnosis not present

## 2021-03-27 DIAGNOSIS — I129 Hypertensive chronic kidney disease with stage 1 through stage 4 chronic kidney disease, or unspecified chronic kidney disease: Secondary | ICD-10-CM | POA: Insufficient documentation

## 2021-03-27 DIAGNOSIS — Z5112 Encounter for antineoplastic immunotherapy: Secondary | ICD-10-CM | POA: Diagnosis not present

## 2021-03-27 DIAGNOSIS — I700 Atherosclerosis of aorta: Secondary | ICD-10-CM | POA: Diagnosis not present

## 2021-03-27 DIAGNOSIS — Z7901 Long term (current) use of anticoagulants: Secondary | ICD-10-CM | POA: Diagnosis not present

## 2021-03-27 DIAGNOSIS — K2100 Gastro-esophageal reflux disease with esophagitis, without bleeding: Secondary | ICD-10-CM | POA: Diagnosis not present

## 2021-03-27 DIAGNOSIS — D6481 Anemia due to antineoplastic chemotherapy: Secondary | ICD-10-CM | POA: Insufficient documentation

## 2021-03-27 DIAGNOSIS — T451X5A Adverse effect of antineoplastic and immunosuppressive drugs, initial encounter: Secondary | ICD-10-CM | POA: Insufficient documentation

## 2021-03-27 LAB — CMP (CANCER CENTER ONLY)
ALT: 32 U/L (ref 0–44)
AST: 53 U/L — ABNORMAL HIGH (ref 15–41)
Albumin: 3.3 g/dL — ABNORMAL LOW (ref 3.5–5.0)
Alkaline Phosphatase: 372 U/L — ABNORMAL HIGH (ref 38–126)
Anion gap: 8 (ref 5–15)
BUN: 14 mg/dL (ref 8–23)
CO2: 23 mmol/L (ref 22–32)
Calcium: 9.9 mg/dL (ref 8.9–10.3)
Chloride: 104 mmol/L (ref 98–111)
Creatinine: 1.53 mg/dL — ABNORMAL HIGH (ref 0.44–1.00)
GFR, Estimated: 38 mL/min — ABNORMAL LOW (ref >60)
Glucose, Bld: 116 mg/dL — ABNORMAL HIGH (ref 70–99)
Potassium: 4.4 mmol/L (ref 3.5–5.1)
Sodium: 135 mmol/L (ref 135–145)
Total Bilirubin: 0.5 mg/dL (ref 0.3–1.2)
Total Protein: 6.7 g/dL (ref 6.5–8.1)

## 2021-03-27 LAB — CBC WITH DIFFERENTIAL (CANCER CENTER ONLY)
Abs Immature Granulocytes: 0.01 10*3/uL (ref 0.00–0.07)
Basophils Absolute: 0 10*3/uL (ref 0.0–0.1)
Basophils Relative: 1 %
Eosinophils Absolute: 0.2 10*3/uL (ref 0.0–0.5)
Eosinophils Relative: 4 %
HCT: 29.2 % — ABNORMAL LOW (ref 36.0–46.0)
Hemoglobin: 9.5 g/dL — ABNORMAL LOW (ref 12.0–15.0)
Immature Granulocytes: 0 %
Lymphocytes Relative: 24 %
Lymphs Abs: 1.1 10*3/uL (ref 0.7–4.0)
MCH: 29.6 pg (ref 26.0–34.0)
MCHC: 32.5 g/dL (ref 30.0–36.0)
MCV: 91 fL (ref 80.0–100.0)
Monocytes Absolute: 0.4 10*3/uL (ref 0.1–1.0)
Monocytes Relative: 8 %
Neutro Abs: 2.8 10*3/uL (ref 1.7–7.7)
Neutrophils Relative %: 63 %
Platelet Count: 235 10*3/uL (ref 150–400)
RBC: 3.21 MIL/uL — ABNORMAL LOW (ref 3.87–5.11)
RDW: 16.5 % — ABNORMAL HIGH (ref 11.5–15.5)
WBC Count: 4.5 10*3/uL (ref 4.0–10.5)
nRBC: 0 % (ref 0.0–0.2)

## 2021-03-27 MED ORDER — TUCATINIB 150 MG PO TABS
300.0000 mg | ORAL_TABLET | Freq: Two times a day (BID) | ORAL | 0 refills | Status: DC
  Filled 2021-03-27: qty 120, 30d supply, fill #0

## 2021-03-27 MED ORDER — CAPECITABINE 500 MG PO TABS
ORAL_TABLET | ORAL | 0 refills | Status: DC
  Filled 2021-03-27: qty 70, fill #0
  Filled 2021-04-01: qty 70, 21d supply, fill #0

## 2021-03-27 MED ORDER — SODIUM CHLORIDE 0.9% FLUSH
10.0000 mL | INTRAVENOUS | Status: DC | PRN
  Administered 2021-03-27: 10 mL

## 2021-03-27 MED ORDER — HEPARIN SOD (PORK) LOCK FLUSH 100 UNIT/ML IV SOLN: 500.0000 [IU] | Freq: Once | INTRAVENOUS | Status: DC | PRN

## 2021-03-27 MED ORDER — APIXABAN 2.5 MG PO TABS
2.5000 mg | ORAL_TABLET | Freq: Two times a day (BID) | ORAL | 1 refills | Status: DC
  Filled 2021-03-27 – 2021-04-01 (×2): qty 60, 30d supply, fill #0
  Filled 2021-04-26: qty 60, 30d supply, fill #1

## 2021-03-27 NOTE — Progress Notes (Signed)
[place]   Telephone:([phone]) [phone] Fax:(336) [phone]   Clinic Follow up Note   Patient Care Team: [name], DO as PCP - General (Family Medicine) [name], MD as Consulting Physician (Gastroenterology) [name], MD as Consulting Physician (Hematology)  Date of Service:  03/27/2021  CHIEF COMPLAINT: f/u of metastatic breast cancer  CURRENT THERAPY:  PENDING third line Xeloda, tucatinib, trastuzumab  ASSESSMENT & PLAN:  Teresa Hays is a 65 y.o. female with   1. 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 She presented with diffuse liver metastasis, two right breast masses and right axillary adenopathy. Breast mass biopsy showed ER PR positive, but 1 was HER-2 positive, the other one was HER-2 negative. Liver met was HER2-. -She was treated with letrozole, trastuzumab and Perjeta, until disease progression on 07/28/20 CT CAP. -She switched to Enhertu q3weeks starting 08/08/20. Given poor toleration with C1 (nausea, diarrhea, poor appetite), dose reduced with C2 and she tolerated much better.  -restaging CT CAP on 03/24/21 showed progressive hepatic metastasis. I reviewed the results with her and her sister (on the phone) today.  -I recommend changing treatment. I discussed oral Xeloda, tucatinib, and IV trastuzumab. I discussed the indications for using and side effects of these medications. I provided her with handouts about Xeloda and tucatinib, the two drugs that are new to her.  -Chemotherapy consent: Side effects including but does not not limited to, fatigue, nausea, vomiting, diarrhea, hair loss, neuropathy, renal and kidney dysfunction, neutropenic fever, needed for blood transfusion, bleeding, reversible cardiomyopathy, were discussed with patient in great detail. She agrees to proceed. -Given her CKD with GFR38, I will reduce Xeloda dose by 25%, she will take 1000 mg in  the morning, 1500 mg in the evening, day 1-14 every 21 days -We will start Tucatinib at 150 mg twice daily for first week, if she tolerates well, will increase to 300 mg twice daily on second week -I will have our pharmacist Wells Guiles reach out to her about obtaining and paying for these medications.  -We will hope to start the week of 04/06/21.   2. Syncope, HTN, Headaches -During week 1 of C2 Enhertu she notes episode of syncope.  -She had another episode about a week after C9. Head CT and MRI w/o contrast on 01/29/21 were negative. -intermittent left-sided headaches, Brain MRI 01/06/21 was negative for metastatic disease or acute intracranial abnormality.   3. Peripheral neuropathy, grade 2, Right Foot pain  -Secondary to chemotherapy -I previously stropped Abraxane on 10/13/18.  -She continues on Gabapentin and Cymbalta.   5. Left UE DVT (08/04/18), On Xarelto 51m indefinitely   6. Anemia, Secondary to chemo -Continue monitoring, will consider blood transfusion if hemoglobin less than 8. -Hgb stable, 9.5 today (03/27/21)   7. Goal of Care Discussion, Social support -She lives alone, has her sister and niece in [place] She is currently staying with her daughter and her family. -On Ativan PRN due to anxiety about diagnosis and treatment. She is working with our sEducation officer, museumto connect with a cSocial worker -The patient understands the goal of care is palliative. -She is full code for now    8. Asthma  -secondary to post nasal drip and her Acid reflux, per Dr. IValeta Harms Will continue medications. Her breathing has improved and adequate now.   -Continue to f/u with Dr IValeta Harmsand Dr MCollene Mares       Plan: -  lab, Herceptin infusion, and f/u on 9/19 or 9/21. -start Xeloda and tucatinib on 9/19 -due to drug drug interaction with Tucatinib, will change her Xarelto to Eliquis 2.32m bid  -labs, flush, and f/u 7-10 days after first infusion.   No problem-specific  Assessment & Plan notes found for this encounter.   SUMMARY OF ONCOLOGIC HISTORY: Oncology History Overview Note  Cancer Staging Metastatic breast cancer ([facility] Staging form: Breast, AJCC 8th Edition - Clinical stage from 03/24/2018: Stage IV (cT2, cN1, pM1, G3, ER+, PR+, HER2+) - Signed by FTruitt Merle MD on 03/30/2018     [place]  01/30/2018 Procedure   Colonoscopy showed small polyp in the sigmoid colon, removed, the exam of colon including the terminal ileum was otherwise negative.   01/30/2018 Procedure   EGD by Dr. MCollene Maresshowed small hiatal hernia, a 8 mm polypoid lesion in the cardia, biopsied.   03/09/2018 Imaging   03/09/2018 [place]dom[place] IMPRESSION: 1. Mass lesions throughout the liver, consistent with metastatic disease. Liver as a somewhat nodular contour suggesting underlying hepatic cirrhosis. Inhomogeneous echotexture to the liver.   2. Cholelithiasis with mild gallbladder wall thickening. A degree of cholecystitis cannot be excluded by ultrasound.   3. Portions of pancreas obscured by gas. Visualized portions of pancreas appear normal.   4. Small right kidney. Etiology uncertain. This finding potentially may be indicative of renal artery stenosis. In this regard, question whether patient is hypertensive.   03/15/2018 Imaging   CT CAP with contrast  IMPRESSION: 1. Widespread hepatic metastasis. 2. 2.6 cm lateral right breast soft tissue nodule could represent a breast primary or an incidental benign lesion. Consider correlation with mammogram and ultrasound. 3. No definite source of primary malignancy identified within the abdomen or pelvis. There is possible rectosigmoid junction wall thickening. Consider colonoscopy with attention to this area. 4. Distal esophageal wall thickening, suggesting esophagitis.   03/24/2018 Cancer Staging   Staging form: Breast, AJCC 8th Edition - Clinical stage from 03/24/2018: Stage IV (cT2, cN1, pM1, G3, ER+, PR+, HER2+) -  Signed by FTruitt Merle MD on 03/30/2018   03/24/2018 Initial Biopsy   Diagnosis 1. 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 Breast, right, needle core biopsy, 9 o'clock, 7cm from nipple - INVASIVE DUCTAL CARCINOMA. -The carcinoma is somewhat morphologically dissimilar from that in part 1. It appears grade III 3. Lymph node, needle/core biopsy, right axillary - METASTATIC CARCINOMA IN 1 OF 1 LYMPH NODE (1/1).   03/24/2018 Receptors her2   Breast biopsy: 1. Estrogen Receptor: 40%, POSITIVE, STRONG-MODERATE STAINING INTENSITY Progesterone Receptor: 70%, POSITIVE, STRONG STAINING INTENSITY Proliferation Marker Ki67: 20% HER 2 equivocal by IHC 2+, POSITIVE by FISH, ratio 2.4 and copy #4.2  2. Estrogen Receptor: 60%, POSITIVE, MODERATE STAINING INTENSITY Progesterone Receptor: 40%, POSITIVE, MODERATE STAINING INTENSITY Proliferation Marker Ki67: 20% HER2 (+) by IHC 3+   03/24/2018 Initial Diagnosis   Metastatic breast cancer ([place]   03/27/2018 Pathology Results   Diagnosis Liver, needle/core biopsy, Right - METASTATIC CARCINOMA TO LIVER, CONSISTENT WITH PATIENTS CLINICAL HISTORY OF PRIMARY BREAST CARCINOMA.  ER 80%+ PR40%+ HER2- (by [facility] IHC 2+)  Ki67 50%    03/28/2018 Pathology Results   03/28/2018 Surgical Pathology Diagnosis 1. Breast, left, needle core biopsy, 9 o'clock - FIBROCYSTIC CHANGES. - THERE IS NO EVIDENCE OF MALIGNANCY. 2. Breast, left, needle core biopsy, 2 o'clock - FIBROADENOMA. - THERE IS NO EVIDENCE OF MALIGNANCY. -  SEE COMMENT.   03/29/2018 Imaging   03/29/2018 Bone Scan IMPRESSION: No scintigraphic evidence of osseous metastatic disease.   03/30/2018 Imaging   Bone scan  IMPRESSION: No scintigraphic evidence of osseous metastatic disease.    04/07/2018 - 07/30/2020 Chemotherapy   First line chemo weekly Taxol and herceptin/Perjeta every 3 weeks starting 04/07/18. She  developed infusion reaction to taxol and it was discontinued. Added Abraxane on C1D8, 2 weeks on/1 week off.  Abraxne stopped after 10/13/18 due to worsening Neuropathy. She has continued with maintenance Herceptin injection/Perjeta q3weeks. Herceptin changed to injection on 04/06/19. Both injections switched to combination [place] on 10/01/19. ----Stopped 07/30/20 due to disease progression in liver.    06/06/2018 Imaging   CT CAP IMPRESSION: 1. Generally improved appearance, with reduced axillary adenopathy and reduced enhancing component of the hepatic masses, with some of the hepatic mass is moderately smaller than on the prior exam. Reduced size of the right lateral breast mass compared to the prior 03/15/2018 exam. 2. New mild interstitial accentuation in the lungs, significance uncertain. Part of this appearance may be due to lower lung volumes on today's exam. 3. Mild wall thickening in the descending colon and upper rectum suggesting low-grade colitis/inflammation. Prominent stool throughout the colon favors constipation. 4. Other imaging findings of potential clinical significance: Aortic Atherosclerosis (ICD10-I70.0). Mild cardiomegaly. Mild nodularity in the right lower lobe appears stable. Contracted and thick-walled gallbladder.     09/18/2018 Imaging   CT CAP W Contrast 09/18/18  IMPRESSION: 1. Liver metastases have decreased in size. 2. Right breast mass, mild right axillary lymphadenopathy and scattered tiny right pulmonary nodules are all stable. 3. New mild left supraclavicular and left subpectoral lymphadenopathy, can not exclude progression of metastatic nodal disease. 4. Moderate colorectal stool volume, which may indicate constipation. 5.  Aortic Atherosclerosis (ICD10-I70.0).   10/2018 - 07/30/2020 Anti-estrogen oral therapy   Letrozole 2.5 mg daily starting 10/2018 D/c to proceed with Inhertu treatment after liver met progression.    01/10/2019 Imaging   CT CAP W Contrast  01/10/19  IMPRESSION: 1. Continued improvement in the hepatic metastatic lesions which have reduced in size. 2. Stable mild left supraclavicular and subpectoral adenopathy. 3. Essentially stable small right lower lobe pulmonary nodule and separate small subpleural nodule along the right hemidiaphragm. Surveillance suggested. 4. Other imaging findings of potential clinical significance: Mild cardiomegaly. Mild circumferential distal esophageal wall thickening, the most common cause would be esophagitis. Airway thickening is present, suggesting bronchitis or reactive airways disease. Airway plugging in the lower lobes and in the right middle lobe. Stable small right adrenal adenoma.   05/15/2019 Imaging   CT CAP W Contrast 05/15/19  IMPRESSION: CT CHEST IMPRESSION   1. Similar appearance of borderline supraclavicular, axillary, and subpectoral adenopathy. 2. Improved and resolved right lower lobe pulmonary nodularity. 3. Esophageal air fluid level suggests dysmotility or gastroesophageal reflux.   CT ABDOMEN AND PELVIS IMPRESSION   1. Improved hepatic metastasis. 2. Small bowel mesenteric lymph nodes which are upper normal and mildly enlarged. Likely increased and similar as detailed above. Indeterminate. Recommend attention on follow-up. 3. Cholelithiasis. 4. Motion degradation throughout the lower chest and abdomen.   09/19/2019 Imaging   CT CAP W contrast  IMPRESSION: 1. Interval decrease in size of right axillary and subpectoral lymph nodes. There are no pathologically enlarged lymph nodes remaining in the chest, abdomen, or pelvis. No new lymphadenopathy. 2. No significant change in post treatment appearance of multiple low-attenuation liver lesions, in keeping with treated metastases. 3. No  evidence of new metastatic disease in the chest, abdomen, or pelvis. 4. Aortic Atherosclerosis (ICD10-I70.0).   11/16/2019 Imaging   IMRI Brain  MPRESSION: Minimal chronic  microvascular ischemic changes. No acute intracranial process.   Active bilateral maxillary sinus disease with mild frontoethmoid mucosal thickening.   01/23/2020 Imaging   CT CAP W contrast  IMPRESSION: 1. Stable exam. No new or progressive interval findings. 2. Stable appearance of upper normal right axillary lymph nodes. Tiny subpectoral and left axillary nodes are unchanged. 3. Generally similar appearance of ill-defined hypoattenuating lesions in the liver compatible with treated metastases. 1 lesion in the central liver is minimally more conspicuous today, likely related to bolus timing and attention on follow-up recommended. No new suspicious liver lesion on today's study. 4. Stable 10 mm right adrenal nodule., indeterminate. Continued attention on follow-up imaging recommended. 5. Cholelithiasis. 6. Aortic Atherosclerosis (ICD10-I70.0).   02/11/2020 Breast MRI   IMPRESSION: 1. 7 millimeter focus of residual enhancement associated with the known malignancy in the 9 o'clock location of the RIGHT breast. 2. No significant enhancement in the known malignancy in the 11:30 o'clock location of the RIGHT breast. 3. Interval resolution of axillary adenopathy.   07/28/2020 Imaging   CT CAP  IMPRESSION: 1. Interval progression of hepatic metastases. 2. Stable indeterminate right adrenal nodule. 3. No new sites of metastatic disease in the chest, abdomen or pelvis. 4. Chronic findings include: Small hiatal hernia. Cholelithiasis. Aortic Atherosclerosis (ICD10-I70.0).     08/08/2020 -  Chemotherapy   Second-line Inhertu q3weeks starting 08/08/20    01/06/2021 Imaging   MRI Brain  IMPRESSION: No evidence of acute intracranial abnormality.   No evidence of intracranial metastatic disease.   Stable noncontrast MRI appearance of the brain as compared to 11/16/2019.   Mild chronic small vessel ischemic changes within the cerebral white matter.   Redemonstrated tiny chronic  lacunar infarct within the right cerebellar hemisphere.   Mild paranasal sinus disease, as described   03/24/2021 Imaging   CT CAP  IMPRESSION: 1. Increase in size and visible number of hepatic metastatic lesions compatible with progressive malignancy. 2. Other imaging findings of potential clinical significance: Aortic Atherosclerosis (ICD10-I70.0). Small type 1 hiatal hernia. Prominent stool throughout the colon favors constipation. Trace free pelvic fluid, etiology uncertain. Small right adrenal adenoma.   03/30/2021 -  Chemotherapy    Patient is on Treatment Plan: BREAST CAPECITABINE + TRASTUZUMAB + TUCATINIB Q21D          INTERVAL HISTORY:  Teresa Hays is here for a follow up of metastatic breast cancer. She was last seen by me on 03/06/21. She presents to the clinic alone. She contacted her sister by phone to be involved in our conversation. She is anxious today regarding her scan results and became tearful. She is very concerned that she is "dying. Will anything work for me?" She denies having pain like she did when she was initially diagnosed. I reassured her that this is a good sign.   All other systems were reviewed with the patient and are negative.  MEDICAL HISTORY:  Past Medical History:  Diagnosis Date   GERD (gastroesophageal reflux disease)    Hypertension    Personal history of chemotherapy    rt breast ca with mets to liver dx'd 03/2018    SURGICAL HISTORY: Past Surgical History:  Procedure Laterality Date   BREAST BIOPSY Right 03/24/2018   CA x3   BREAST BIOPSY Left 03/28/2018   neg   BREAST BIOPSY Left 03/30/2018  neg   COLONOSCOPY     ESOPHAGOGASTRODUODENOSCOPY ENDOSCOPY     IR IMAGING GUIDED PORT INSERTION  04/04/2018    I have reviewed the social history and family history with the patient and they are unchanged from previous note.  ALLERGIES:  has No Known Allergies.  MEDICATIONS:  Current Outpatient Medications  Medication Sig  Dispense Refill   capecitabine (XELODA) 500 MG tablet Take 2 tabs in morning and 3 tabs in evening, 30 mins after meal. Take for 14 days then off for 7 days. 70 tablet 0   tucatinib (TUKYSA) 150 MG tablet Take 2 tablets (300 mg total) by mouth 2 (two) times daily. Take every 12 hrs at the same time each day with or without a meal. Take 1 tab twice daily for first week. 120 tablet 0   amLODipine (NORVASC) 5 MG tablet TAKE 1 TABLET BY MOUTH ONCE DAILY 30 tablet 2   Budeson-Glycopyrrol-Formoterol 160-9-4.8 MCG/ACT AERO INHALE 2 PUFFS BY MOUTH INTO THE LUNGS IN THE MORNING AND AT BEDTIME. 10.7 g 6   carvedilol (COREG) 3.125 MG tablet TAKE 1 TABLET BY MOUTH TWICE DAILY. REPLACES ATENOLOL 180 tablet 3   diphenoxylate-atropine (LOMOTIL) 2.5-0.025 MG tablet Take 1-2 tablets by mouth 4 (four) times daily as needed for diarrhea or loose stools. 30 tablet 0   DULoxetine (CYMBALTA) 20 MG capsule TAKE 2 CAPSULES BY MOUTH DAILY 60 capsule 3   gabapentin (NEURONTIN) 300 MG capsule MAY TAKE UP TO 3 CAPSULES THREE TIMES DAILY BY MOUTH 270 capsule 3   lidocaine (XYLOCAINE) 2 % jelly APPLY OVER PORT A CATH TOPICALLY 1 HOUR BEFORE PORT BEING ACCESSED AS NEEDED 30 mL 2   LORazepam (ATIVAN) 0.5 MG tablet Take 1 tablet (0.5 mg total) by mouth every 8 (eight) hours as needed for anxiety. 30 tablet 0   losartan (COZAAR) 50 MG tablet Take 2 tablets (100 mg total) by mouth daily. 180 tablet 3   meclizine (ANTIVERT) 25 MG tablet TAKE 1/2 TABLET BY MOUTH 3 TIMES DAILY AS NEEDED FOR DIZZINESS 15 tablet 2   montelukast (SINGULAIR) 10 MG tablet TAKE 1 TABLET (10 MG TOTAL) BY MOUTH AT BEDTIME. 30 tablet 5   omeprazole (PRILOSEC) 40 MG capsule TAKE 1 CAPSULE BY MOUTH 2 TIMES DAILY 60 capsule 3   ondansetron (ZOFRAN) 8 MG tablet TAKE 1 TABLET BY MOUTH EVERY 8 HOURS AS NEEDED FOR NAUSEA OR VOMITING 30 tablet 3   potassium chloride SA (KLOR-CON) 20 MEQ tablet TAKE 1 TABLET BY MOUTH ONCE A DAY 30 tablet 3   prochlorperazine (COMPAZINE)  10 MG tablet TAKE 1 TABLET BY MOUTH EVERY 6 HOURS AS NEEDED FOR NAUSEA OR VOMITING 30 tablet 3   rivaroxaban (XARELTO) 20 MG TABS tablet TAKE 1 TABLET BY MOUTH DAILY WITH SUPPER 30 tablet 5   spironolactone (ALDACTONE) 25 MG tablet TAKE 1 TABLET BY MOUTH ONCE DAILY 90 tablet 3   traMADol (ULTRAM) 50 MG tablet Take 1 tablet (50 mg total) by mouth every 6 (six) hours as needed for moderate pain or severe pain. 30 tablet 0   Zoster Vaccine Adjuvanted (SHINGRIX) injection INJECT 0.5 MLS INTO THE MUSCLE ONCE FOR 1 DOSE. 1 each 1   No current facility-administered medications for this visit.   Facility-Administered Medications Ordered in Other Visits  Medication Dose Route Frequency Provider Last Rate Last Admin   sodium chloride flush (NS) 0.9 % injection 10 mL  10 mL Intracatheter PRN [name], MD   10 mL at 05/28/20 0935  PHYSICAL EXAMINATION: ECOG PERFORMANCE STATUS: 1 - Symptomatic but completely ambulatory  Vitals:   03/27/21 1127  BP: 111/77  Pulse: 81  Resp: 17  Temp: 98.4 F (36.9 C)  SpO2: 100%   Wt Readings from Last 3 Encounters:  03/27/21 150 lb (68 kg)  03/06/21 150 lb (68 kg)  02/13/21 150 lb 8 oz (68.3 kg)     GENERAL:alert, no distress and comfortable SKIN: skin color normal, no rashes or significant lesions EYES: normal, Conjunctiva are pink and non-injected, sclera clear  NEURO: alert & oriented x 3 with fluent speech  LABORATORY DATA:  I have reviewed the data as listed CBC Latest Ref Rng & Units 03/27/2021 03/06/2021 02/13/2021  WBC 4.0 - 10.5 K/uL 4.5 5.3 4.6  Hemoglobin 12.0 - 15.0 g/dL 9.5(L) 10.0(L) 9.4(L)  Hematocrit 36.0 - 46.0 % 29.2(L) 31.1(L) 29.2(L)  Platelets 150 - 400 K/uL 235 202 218     CMP Latest Ref Rng & Units 03/27/2021 03/06/2021 02/13/2021  Glucose 70 - 99 mg/dL 116(H) 123(H) 121(H)  BUN 8 - 23 mg/dL _0 Creatinine 0.44 - 1.00 mg/dL 1.53(H) 1.57(H) 1.38(H)  Sodium 135 - 145 mmol/L 135 136 138  Potassium 3.5 - 5.1 mmol/L 4.4 4.4  4.2  Chloride 98 - 111 mmol/L 104 103 105  CO2 22 - 32 mmol/L _1 Calcium 8.9 - 10.3 mg/dL 9.9 9.9 9.5  Total Protein 6.5 - 8.1 g/dL 6.7 6.9 6.5  Total Bilirubin 0.3 - 1.2 mg/dL 0.5 0.5 0.4  Alkaline Phos 38 - 126 U/L 372(H) 276(H) 301(H)  AST 15 - 41 U/L 53(H) 49(H) 46(H)  ALT 0 - 44 U/L 32 26 33      RADIOGRAPHIC STUDIES: I have personally reviewed the radiological images as listed and agreed with the findings in the report. No results found.    No orders of the defined types were placed in this encounter.  All questions were answered. The patient knows to call the clinic with any problems, questions or concerns. No barriers to learning was detected. The total time spent in the appointment was 40 minutes.     [name], MD 03/27/2021   I, Wilburn Mylar, am acting as scribe for [name], MD.   I have reviewed the above documentation for accuracy and completeness, and I agree with the above.

## 2021-03-27 NOTE — Telephone Encounter (Signed)
Oral Oncology Pharmacist Encounter  Received new prescription for Tukysa (tucatinib) and Xeloda (capecitabine) for the treatment of metastatic, HR positive, HER-2 positive breast cancer, in conjunction with trastuzumab, planned duration until disease progression or unacceptable drug toxicity.  Prescription dose and frequency assessed for appropriateness. CBC w/ Diff and CMP from 03/27/21 assessed, noted baseline renal dysfunction (Scr 1.53 mg/dL, CrCl ~39 mL/min) - dose of Xeloda has been dose reduced accordingly by 25%. No further dose adjustments warranted at this time. Per Dr. Burr Medico, dose of Laury Axon will be dose reduced by 50% for first week of treatment.   Current medication list in Epic reviewed, DDIs with Laury Axon and Xeloda identified: The concurrent use of Tukysa and Xarelto is contraindicated due to risk of increased serum concentrations levels of Xarelto. Recommend switching patient to apixaban 2.5 mg PO BID (apixaban needs to be at a 50% dose reduction of normal 5 mg PO BID dose when used in combination with [place]). Dr. Burr Medico notified.  Category C DDI between Xeloda and Omeprazole - proton-pump inhibitors can decrease efficacy of Xeloda - will discuss with patient alternatives to omeprazole, such as H2RA's like famotidine while on Xeloda. Category C DDI between Tukysa and Amlodipine as well as Tramadol due to risk of increased ADEs from amlodipine and tramadol due to CYP3A4 inhibition from [place]. Recommend monitoring for increased side effects from this agents including, but not limited to sedation, lethargy and hypotension.  Evaluated chart and no patient barriers to medication adherence noted.   Patient agreement for treatment documented in MD note on 03/27/21.  Prescription has been e-scribed to the [facility] for benefits analysis and approval.  Oral Oncology Clinic will continue to follow for insurance authorization, copayment issues, initial counseling and start  date.  Leron Croak, PharmD, BCPS Hematology/Oncology Clinical Pharmacist [facility] [phone] 03/27/2021 3:36 PM

## 2021-03-27 NOTE — Progress Notes (Signed)
Patient had Flush appointment with lab draws.  Left her accessed for Dr. Hilaria Ota and Jessee Avers, LPN will deaccess her at the Dr. Hilaria Ota.

## 2021-03-28 ENCOUNTER — Other Ambulatory Visit (HOSPITAL_COMMUNITY): Payer: Self-pay

## 2021-03-28 LAB — CANCER ANTIGEN 27.29: CA 27.29: 422.2 U/mL — ABNORMAL HIGH (ref 0.0–38.6)

## 2021-03-30 ENCOUNTER — Other Ambulatory Visit (HOSPITAL_COMMUNITY): Payer: Self-pay

## 2021-03-31 ENCOUNTER — Other Ambulatory Visit (HOSPITAL_COMMUNITY): Payer: Self-pay

## 2021-03-31 ENCOUNTER — Encounter: Payer: Self-pay | Admitting: General Practice

## 2021-03-31 ENCOUNTER — Telehealth: Payer: Self-pay

## 2021-03-31 MED ORDER — TUCATINIB 150 MG PO TABS
300.0000 mg | ORAL_TABLET | Freq: Two times a day (BID) | ORAL | 0 refills | Status: DC
Start: 2021-03-31 — End: 2021-05-01
  Filled 2021-03-31 – 2021-04-01 (×2): qty 120, 30d supply, fill #0

## 2021-03-31 NOTE — Telephone Encounter (Signed)
This nurse received a message from this patient stating that MD is changing her medication and she was told that she is supposed to stop her Xarelto and start taking Eliquis due to drug interaction with new oral chemo.  Patient would like to know when she is supposed to stop Xarelto and start the Eliquis.  No further questions or concerns noted.

## 2021-03-31 NOTE — Progress Notes (Signed)
[facility] Spiritual Care Note  Connected with Ms Rajan by phone per referral for emotional support from Dr Burr Medico. Will add Ms Kaercher to our programming email list per her request and also send her a current programming packet.  She was very appreciative of call and interested in massages and other opportunities, especially those which might build a sense of community. She reports good support from her sisters (in [place], [place], [place], and [employer]), as well as her daughter (and six-month-old granddaughter!) in [place].  We plan to follow up in infusion for further support.   [place], [place], [facility] Pager [phone] Voicemail [phone]

## 2021-04-01 ENCOUNTER — Other Ambulatory Visit (HOSPITAL_COMMUNITY): Payer: Self-pay

## 2021-04-01 ENCOUNTER — Encounter: Payer: Self-pay | Admitting: Hematology

## 2021-04-01 MED FILL — Ondansetron HCl Tab 8 MG: ORAL | 10 days supply | Qty: 30 | Fill #1 | Status: AC

## 2021-04-01 NOTE — Telephone Encounter (Addendum)
Oral Chemotherapy Pharmacist Encounter  I spoke with patient for overview of new medication regimen consisting of Tukysa (tucatinib) and Xeloda (capecitabine) for the treatment of metastatic, HER-2 receptor positive breast cancer, HR positive, in conjunction with infusional trastuzumab, planned duration until disease progression or unacceptable toxicity.  Counseled patient on administration, dosing, side effects, monitoring, drug-food interactions, safe handling, storage, and disposal.  Patient will take Xeloda $RemoveBef'500mg'LCrdWlgfEu$  tablets, 2 tablets ($RemoveBe'1000mg'QStOMkjsg$ ) by mouth in AM and 3 tabs ($Remov'1500mg'yOglMX$ ) by mouth in PM, within 30 minutes of finishing meals, on days 1-14 of each 21 day cycle.   Patient will take Tukysa $RemoveBef'150mg'vUWkPxWCmY$  tablets, 2 tablets ($RemoveBe'300mg'zGvMjbBeG$ ) by mouth twice daily, without regard to food, taken continuously. For the first 7 days that patient is starting initial therapy, she will take 1 tablet (150 mg) by mouth twice daily per Dr. Burr Medico.  Xeloda and Tukysa start date: 04/06/21  Adverse effects of Xeloda include but are not limited to: fatigue, decreased blood counts, GI upset, diarrhea, mouth sores, and hand-foot syndrome.  Adverse effects of Tukysa include but are not limited to: palmar-plantar erythrodysesthesia, increased liver enzymes, nausea, vomiting, mouth sores, decreased appetite, diarrhea, and electrolyte abnormalities.  Patient has anti-emetic on hand and knows to take it if nausea develops.   Patient will obtain anti diarrheal and alert the office of 4 or more loose stools above baseline.   Reviewed with patient importance of keeping a medication schedule and plan for any missed doses. No barriers to medication adherence identified.  Medication reconciliation performed and medication/allergy list updated. Patient agreeable to switching from omeprazole to famotidine while on Xeloda due to drug-drug interaction.   Discussed in detail with patient about switching from rivaroxaban to apixaban due to  drug-drug interaction with [place]. Patient will take last dose of rivaroxaban on 04/03/21 AM, and will take first dose of apixaban 04/04/21 AM. Patient verbalized understanding that she will be taking apixaban twice daily.   Biomedical engineer for Xeloda and Laury Axon have been obtained. Prescriptions will ship from the [facility] on 04/02/21 to deliver to patient's home on 04/03/21.  Patient informed the pharmacy will reach out 5-7 days prior to needing next fills of Xeloda and Tukysa to coordinate continued medication acquisition to prevent break in therapy.  All questions answered.  Ms. Gaydos voiced understanding and appreciation.   Medication education handout and medication calendar placed in mail for patient. Patient knows to call the office with questions or concerns. Oral Chemotherapy Clinic phone number provided to patient.   Leron Croak, PharmD, BCPS Hematology/Oncology Clinical Pharmacist [facility] [phone] 04/01/2021 4:46 PM

## 2021-04-02 ENCOUNTER — Other Ambulatory Visit (HOSPITAL_COMMUNITY): Payer: Self-pay

## 2021-04-02 ENCOUNTER — Ambulatory Visit (INDEPENDENT_AMBULATORY_CARE_PROVIDER_SITE_OTHER): Payer: Medicare Other | Admitting: Psychologist

## 2021-04-02 ENCOUNTER — Encounter: Payer: Self-pay | Admitting: General Practice

## 2021-04-02 DIAGNOSIS — F320 Major depressive disorder, single episode, mild: Secondary | ICD-10-CM

## 2021-04-02 NOTE — Progress Notes (Signed)
[facility]ual [facility] Note  Sent request to add Ms Blew to [facility] support programming monthly email list and mailed packet of current programming info with handwritten card.   [place], [place], [facility] Pager [phone] Voicemail [phone]

## 2021-04-03 NOTE — Progress Notes (Signed)
Pharmacist Chemotherapy Monitoring - Initial Assessment    Anticipated start date: 04/10/21   The following has been reviewed per standard work regarding the patient's treatment regimen: The patient's diagnosis, treatment plan and drug doses, and organ/hematologic function Lab orders and baseline tests specific to treatment regimen  The treatment plan start date, drug sequencing, and pre-medications Prior authorization status  Patient's documented medication list, including drug-drug interaction screen and prescriptions for anti-emetics and supportive care specific to the treatment regimen The drug concentrations, fluid compatibility, administration routes, and timing of the medications to be used The patient's access for treatment and lifetime cumulative dose history, if applicable  The patient's medication allergies and previous infusion related reactions, if applicable   Changes made to treatment plan:  N/A  Follow up needed:  [place], [place], 04/03/2021  2:54 PM

## 2021-04-06 ENCOUNTER — Other Ambulatory Visit: Payer: Self-pay | Admitting: Hematology

## 2021-04-06 ENCOUNTER — Other Ambulatory Visit (HOSPITAL_COMMUNITY): Payer: Self-pay

## 2021-04-06 DIAGNOSIS — G620 Drug-induced polyneuropathy: Secondary | ICD-10-CM

## 2021-04-06 DIAGNOSIS — C50919 Malignant neoplasm of unspecified site of unspecified female breast: Secondary | ICD-10-CM

## 2021-04-06 DIAGNOSIS — T451X5A Adverse effect of antineoplastic and immunosuppressive drugs, initial encounter: Secondary | ICD-10-CM

## 2021-04-06 MED ORDER — DULOXETINE HCL 20 MG PO CPEP
ORAL_CAPSULE | Freq: Every day | ORAL | 3 refills | Status: DC
  Filled 2021-04-06: qty 60, 30d supply, fill #0

## 2021-04-06 MED FILL — Budesonide-Glycopyrrolate-Formoterol Aers 160-9-4.8 MCG/ACT: RESPIRATORY_TRACT | 30 days supply | Qty: 10.7 | Fill #4 | Status: AC

## 2021-04-07 ENCOUNTER — Other Ambulatory Visit (HOSPITAL_COMMUNITY): Payer: Self-pay

## 2021-04-08 ENCOUNTER — Other Ambulatory Visit (HOSPITAL_COMMUNITY): Payer: Self-pay

## 2021-04-10 ENCOUNTER — Ambulatory Visit (HOSPITAL_COMMUNITY)
Admission: RE | Admit: 2021-04-10 | Discharge: 2021-04-10 | Disposition: A | Discharge status: Home / Self Care | Payer: Medicare Other | Source: Ambulatory Visit | Attending: Internal Medicine | Admitting: Internal Medicine

## 2021-04-10 ENCOUNTER — Encounter (HOSPITAL_COMMUNITY): Payer: Self-pay | Admitting: Internal Medicine

## 2021-04-10 ENCOUNTER — Inpatient Hospital Stay (HOSPITAL_BASED_OUTPATIENT_CLINIC_OR_DEPARTMENT_OTHER): Payer: Medicare Other | Admitting: Hematology

## 2021-04-10 ENCOUNTER — Inpatient Hospital Stay: Payer: Medicare Other

## 2021-04-10 ENCOUNTER — Encounter: Payer: Self-pay | Admitting: Hematology

## 2021-04-10 ENCOUNTER — Ambulatory Visit (HOSPITAL_BASED_OUTPATIENT_CLINIC_OR_DEPARTMENT_OTHER)
Admission: RE | Admit: 2021-04-10 | Discharge: 2021-04-10 | Disposition: A | Discharge status: Home / Self Care | Payer: Medicare Other | Source: Ambulatory Visit | Attending: Internal Medicine | Admitting: Internal Medicine

## 2021-04-10 ENCOUNTER — Other Ambulatory Visit: Payer: Self-pay

## 2021-04-10 VITALS — BP 102/64 | HR 82 | Wt 144.6 lb

## 2021-04-10 VITALS — BP 129/89 | HR 77 | Temp 98.0°F | Resp 16 | Ht 64.0 in | Wt 149.5 lb

## 2021-04-10 DIAGNOSIS — C50919 Malignant neoplasm of unspecified site of unspecified female breast: Secondary | ICD-10-CM

## 2021-04-10 DIAGNOSIS — I82621 Acute embolism and thrombosis of deep veins of right upper extremity: Secondary | ICD-10-CM | POA: Diagnosis not present

## 2021-04-10 DIAGNOSIS — Z5112 Encounter for antineoplastic immunotherapy: Secondary | ICD-10-CM | POA: Diagnosis not present

## 2021-04-10 DIAGNOSIS — I509 Heart failure, unspecified: Secondary | ICD-10-CM | POA: Insufficient documentation

## 2021-04-10 DIAGNOSIS — C50811 Malignant neoplasm of overlapping sites of right female breast: Secondary | ICD-10-CM | POA: Diagnosis not present

## 2021-04-10 DIAGNOSIS — I10 Essential (primary) hypertension: Secondary | ICD-10-CM

## 2021-04-10 DIAGNOSIS — Z79899 Other long term (current) drug therapy: Secondary | ICD-10-CM | POA: Diagnosis not present

## 2021-04-10 DIAGNOSIS — Z7901 Long term (current) use of anticoagulants: Secondary | ICD-10-CM | POA: Diagnosis not present

## 2021-04-10 DIAGNOSIS — I110 Hypertensive heart disease with heart failure: Secondary | ICD-10-CM | POA: Diagnosis not present

## 2021-04-10 DIAGNOSIS — Z23 Encounter for immunization: Secondary | ICD-10-CM

## 2021-04-10 DIAGNOSIS — Z803 Family history of malignant neoplasm of breast: Secondary | ICD-10-CM | POA: Diagnosis not present

## 2021-04-10 DIAGNOSIS — I959 Hypotension, unspecified: Secondary | ICD-10-CM | POA: Diagnosis not present

## 2021-04-10 DIAGNOSIS — K2100 Gastro-esophageal reflux disease with esophagitis, without bleeding: Secondary | ICD-10-CM | POA: Insufficient documentation

## 2021-04-10 DIAGNOSIS — C787 Secondary malignant neoplasm of liver and intrahepatic bile duct: Secondary | ICD-10-CM | POA: Insufficient documentation

## 2021-04-10 DIAGNOSIS — Z95828 Presence of other vascular implants and grafts: Secondary | ICD-10-CM

## 2021-04-10 DIAGNOSIS — Z7189 Other specified counseling: Secondary | ICD-10-CM

## 2021-04-10 LAB — CBC WITH DIFFERENTIAL (CANCER CENTER ONLY)
Abs Immature Granulocytes: 0.01 10*3/uL (ref 0.00–0.07)
Basophils Absolute: 0 10*3/uL (ref 0.0–0.1)
Basophils Relative: 1 %
Eosinophils Absolute: 0.1 10*3/uL (ref 0.0–0.5)
Eosinophils Relative: 1 %
HCT: 32.4 % — ABNORMAL LOW (ref 36.0–46.0)
Hemoglobin: 10.8 g/dL — ABNORMAL LOW (ref 12.0–15.0)
Immature Granulocytes: 0 %
Lymphocytes Relative: 24 %
Lymphs Abs: 1.2 10*3/uL (ref 0.7–4.0)
MCH: 29 pg (ref 26.0–34.0)
MCHC: 33.3 g/dL (ref 30.0–36.0)
MCV: 87.1 fL (ref 80.0–100.0)
Monocytes Absolute: 0.3 10*3/uL (ref 0.1–1.0)
Monocytes Relative: 6 %
Neutro Abs: 3.3 10*3/uL (ref 1.7–7.7)
Neutrophils Relative %: 68 %
Platelet Count: 162 10*3/uL (ref 150–400)
RBC: 3.72 MIL/uL — ABNORMAL LOW (ref 3.87–5.11)
RDW: 15.9 % — ABNORMAL HIGH (ref 11.5–15.5)
WBC Count: 4.9 10*3/uL (ref 4.0–10.5)
nRBC: 0 % (ref 0.0–0.2)

## 2021-04-10 LAB — CMP (CANCER CENTER ONLY)
ALT: 49 U/L — ABNORMAL HIGH (ref 0–44)
AST: 79 U/L — ABNORMAL HIGH (ref 15–41)
Albumin: 3.8 g/dL (ref 3.5–5.0)
Alkaline Phosphatase: 451 U/L — ABNORMAL HIGH (ref 38–126)
Anion gap: 9 (ref 5–15)
BUN: 25 mg/dL — ABNORMAL HIGH (ref 8–23)
CO2: 20 mmol/L — ABNORMAL LOW (ref 22–32)
Calcium: 10.3 mg/dL (ref 8.9–10.3)
Chloride: 102 mmol/L (ref 98–111)
Creatinine: 1.23 mg/dL — ABNORMAL HIGH (ref 0.44–1.00)
GFR, Estimated: 49 mL/min — ABNORMAL LOW (ref >60)
Glucose, Bld: 101 mg/dL — ABNORMAL HIGH (ref 70–99)
Potassium: 4.8 mmol/L (ref 3.5–5.1)
Sodium: 131 mmol/L — ABNORMAL LOW (ref 135–145)
Total Bilirubin: 1.2 mg/dL (ref 0.3–1.2)
Total Protein: 7.6 g/dL (ref 6.5–8.1)

## 2021-04-10 LAB — ECHOCARDIOGRAM COMPLETE: S' Lateral: 2.5 cm

## 2021-04-10 MED ORDER — LOSARTAN POTASSIUM 50 MG PO TABS: 50.0000 mg | ORAL_TABLET | Freq: Every day | ORAL | 3 refills | Status: DC

## 2021-04-10 MED ORDER — TRASTUZUMAB-DKST CHEMO 150 MG IV SOLR
8.0000 mg/kg | Freq: Once | INTRAVENOUS | Status: AC
  Administered 2021-04-10: 546 mg via INTRAVENOUS
  Filled 2021-04-10: qty 26

## 2021-04-10 MED ORDER — HEPARIN SOD (PORK) LOCK FLUSH 100 UNIT/ML IV SOLN
500.0000 [IU] | Freq: Once | INTRAVENOUS | Status: AC | PRN
  Administered 2021-04-10: 500 [IU]

## 2021-04-10 MED ORDER — INFLUENZA VAC A&B SA ADJ QUAD 0.5 ML IM PRSY
0.5000 mL | PREFILLED_SYRINGE | Freq: Once | INTRAMUSCULAR | Status: DC
  Filled 2021-04-10: qty 0.5

## 2021-04-10 MED ORDER — DIPHENHYDRAMINE HCL 25 MG PO CAPS
50.0000 mg | ORAL_CAPSULE | Freq: Once | ORAL | Status: AC
  Administered 2021-04-10: 50 mg via ORAL
  Filled 2021-04-10: qty 2

## 2021-04-10 MED ORDER — ACETAMINOPHEN 325 MG PO TABS
650.0000 mg | ORAL_TABLET | Freq: Once | ORAL | Status: AC
  Administered 2021-04-10: 650 mg via ORAL
  Filled 2021-04-10: qty 2

## 2021-04-10 MED ORDER — SODIUM CHLORIDE 0.9 % IV SOLN: Freq: Once | INTRAVENOUS | Status: AC

## 2021-04-10 MED ORDER — SODIUM CHLORIDE 0.9% FLUSH
10.0000 mL | INTRAVENOUS | Status: DC | PRN
Start: 2021-04-10 — End: 2021-04-10
  Administered 2021-04-10: 10 mL

## 2021-04-10 MED ORDER — SODIUM CHLORIDE 0.9% FLUSH
10.0000 mL | INTRAVENOUS | Status: DC | PRN
  Administered 2021-04-10: 10 mL

## 2021-04-10 MED ORDER — SPIRONOLACTONE 25 MG PO TABS: 12.5000 mg | ORAL_TABLET | Freq: Every day | ORAL | 3 refills | Status: DC

## 2021-04-10 MED ORDER — INFLUENZA VAC SPLIT QUAD 0.5 ML IM SUSY
0.5000 mL | PREFILLED_SYRINGE | Freq: Once | INTRAMUSCULAR | Status: AC
  Administered 2021-04-10: 0.5 mL via INTRAMUSCULAR
  Filled 2021-04-10: qty 0.5

## 2021-04-10 NOTE — Patient Instructions (Addendum)
[facility]  Discharge Instructions: Thank you for choosing [facility] to provide your oncology and hematology care.   If you have a lab appointment with the [facility], please go directly to the [facility] and check in at the registration area.   Wear comfortable clothing and clothing appropriate for easy access to any Portacath or PICC line.   We strive to give you quality time with your provider. You may need to reschedule your appointment if you arrive late (15 or more minutes).  Arriving late affects you and other patients whose appointments are after yours.  Also, if you miss three or more appointments without notifying the office, you may be dismissed from the clinic at the provider's discretion.      For prescription refill requests, have your pharmacy contact our office and allow 72 hours for refills to be completed.    Today you received the following chemotherapy and/or immunotherapy agents: Herceptin      To help prevent nausea and vomiting after your treatment, we encourage you to take your nausea medication as directed.  BELOW ARE SYMPTOMS THAT SHOULD BE REPORTED IMMEDIATELY: *FEVER GREATER THAN 100.4 F (38 C) OR HIGHER *CHILLS OR SWEATING *NAUSEA AND VOMITING THAT IS NOT CONTROLLED WITH YOUR NAUSEA MEDICATION *UNUSUAL SHORTNESS OF BREATH *UNUSUAL BRUISING OR BLEEDING *URINARY PROBLEMS (pain or burning when urinating, or frequent urination) *BOWEL PROBLEMS (unusual diarrhea, constipation, pain near the anus) TENDERNESS IN MOUTH AND THROAT WITH OR WITHOUT PRESENCE OF ULCERS (sore throat, sores in mouth, or a toothache) UNUSUAL RASH, SWELLING OR PAIN  UNUSUAL VAGINAL DISCHARGE OR ITCHING   Items with * indicate a potential emergency and should be followed up as soon as possible or go to the Emergency Department if any problems should occur.  Please show the CHEMOTHERAPY ALERT CARD or IMMUNOTHERAPY ALERT CARD at check-in to  the Emergency Department and triage nurse.  Should you have questions after your visit or need to cancel or reschedule your appointment, please contact [name]  Dept: [phone]  and follow the prompts.  Office hours are 8:00 a.m. to 4:30 p.m. Monday - Friday. Please note that voicemails left after 4:00 p.m. may not be returned until the following business day.  We are closed weekends and major holidays. You have access to a nurse at all times for urgent questions. Please call the main number to the clinic Dept: [phone] and follow the prompts.   For any non-urgent questions, you may also contact your provider using MyChart. We now offer e-Visits for anyone 53 and older to request care online for non-urgent symptoms. For details visit [email or web address].   Also download the MyChart app! Go to the app store, search "MyChart", open the app, select [facility], and log in with your MyChart username and password.  Due to Covid, a mask is required upon entering the hospital/clinic. If you do not have a mask, one will be given to you upon arrival. For doctor visits, patients may have 1 support person aged 22 or older with them. For treatment visits, patients cannot have anyone with them due to current Covid guidelines and our immunocompromised population.   Influenza Virus Vaccine injection What is this medication? INFLUENZA VIRUS VACCINE (in floo EN zuh VAHY ruhs vak SEEN) helps to reduce the risk of getting influenza also known as the flu. The vaccine only helps protect you against some strains of the flu. This medicine may be used  for other purposes; ask your health care provider or pharmacist if you have questions. COMMON BRAND NAME(S): Afluria, Afluria Quadrivalent, Agriflu, Alfuria, FLUAD, FLUAD Quadrivalent, Fluarix, Fluarix Quadrivalent, Flublok, Flublok Quadrivalent, FLUCELVAX, FLUCELVAX Quadrivalent, Flulaval, Flulaval Quadrivalent, Fluvirin, Fluzone,  Fluzone High-Dose, Fluzone Intradermal, Fluzone Quadrivalent What should I tell my care team before I take this medication? They need to know if you have any of these conditions: bleeding disorder like hemophilia fever or infection Guillain-Barre syndrome or other neurological problems immune system problems infection with the human immunodeficiency virus (HIV) or AIDS low blood platelet counts multiple sclerosis an unusual or allergic reaction to influenza virus vaccine, latex, other medicines, foods, dyes, or preservatives. Different brands of vaccines contain different allergens. Some may contain latex or eggs. Talk to your doctor about your allergies to make sure that you get the right vaccine. pregnant or trying to get pregnant breast-feeding How should I use this medication? This vaccine is for injection into a muscle or under the skin. It is given by a health care professional. A copy of Vaccine Information Statements will be given before each vaccination. Read this sheet carefully each time. The sheet may change frequently. Talk to your healthcare provider to see which vaccines are right for you. Some vaccines should not be used in all age groups. Overdosage: If you think you have taken too much of this medicine contact a poison control center or emergency room at once. NOTE: This medicine is only for you. Do not share this medicine with others. What if I miss a dose? This does not apply. What may interact with this medication? chemotherapy or radiation therapy medicines that lower your immune system like etanercept, anakinra, infliximab, and adalimumab medicines that treat or prevent blood clots like warfarin phenytoin steroid medicines like prednisone or cortisone theophylline vaccines This list may not describe all possible interactions. Give your health care provider a list of all the medicines, herbs, non-prescription drugs, or dietary supplements you use. Also tell them if  you smoke, drink alcohol, or use illegal drugs. Some items may interact with your medicine. What should I watch for while using this medication? Report any side effects that do not go away within 3 days to your doctor or health care professional. Call your health care provider if any unusual symptoms occur within 6 weeks of receiving this vaccine. You may still catch the flu, but the illness is not usually as bad. You cannot get the flu from the vaccine. The vaccine will not protect against colds or other illnesses that may cause fever. The vaccine is needed every year. What side effects may I notice from receiving this medication? Side effects that you should report to your doctor or health care professional as soon as possible: allergic reactions like skin rash, itching or hives, swelling of the face, lips, or tongue Side effects that usually do not require medical attention (report to your doctor or health care professional if they continue or are bothersome): fever headache muscle aches and pains pain, tenderness, redness, or swelling at the injection site tiredness Side effects that you should report to your doctor or health care professional as soon as possible: allergic reactions like skin rash, itching or hives, swelling of the face, lips, or tongue Side effects that usually do not require medical attention (report to your doctor or health care professional if they continue or are bothersome): fever headache muscle aches and pains pain, tenderness, redness, or swelling at the injection site tiredness This list may not  describe all possible side effects. Call your doctor for medical advice about side effects. You may report side effects to FDA at 1-800-FDA-1088. Where should I keep my medication? The vaccine will be given by a health care professional in a clinic, pharmacy, doctor's office, or other health care setting. You will not be given vaccine doses to store at home. NOTE: This  sheet is a summary. It may not cover all possible information. If you have questions about this medicine, talk to your doctor, pharmacist, or health care provider.  2022 Elsevier/Gold Standard (2020-03-11 19:49:22)

## 2021-04-10 NOTE — Progress Notes (Signed)
[facility]   [phone] [phone]   Clinic Follow up Note   Patient Care Team: [name], DO as PCP - General (Family Medicine) [name], MD as Consulting Physician (Gastroenterology) [name], MD as Consulting Physician (Hematology)  Date of Service:  04/10/2021  CHIEF COMPLAINT: f/u of metastatic breast cancer  CURRENT THERAPY:  -Tucatinib and Xeloda started 04/04/21, held due to side effects 04/10/21 -Herceptin restarted 04/10/21  ASSESSMENT & PLAN:  Teresa Hays is a 65 y.o. female with   1. 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 She presented with diffuse liver metastasis, two right breast masses and right axillary adenopathy. Breast mass biopsy showed ER PR positive, but 1 was HER-2 positive, the other one was HER-2 negative. Liver met was HER2-. -She was treated with letrozole, trastuzumab and Perjeta, until disease progression on 07/28/20 CT CAP. -She switched to Enhertu q3weeks starting 08/08/20. Given poor toleration with C1 (nausea, diarrhea, poor appetite), dose reduced with C2 and she tolerated much better.  -restaging CT CAP on 03/24/21 showed progressive hepatic metastasis. -We changed her treatment to oral Xeloda, tucatinib, and IV trastuzumab. -Given her CKD with QIH47, I will reduce Xeloda dose by 25%, she will take 1000 mg in the morning, 1500 mg in the evening, day 1-14 every 21 days -She began tucatinib and Xeloda on 04/04/21 and developed dizziness and nausea. Since it seems she is not tolerating well, we will discontinue for the weekend. She will restart her tucatinib on Monday, 04/13/21, at half-dose if she feels well again. She will continue to hold the Xeloda until we see her again in 3 weeks.  -We will still proceed with trastuzumab today, as we know she previously tolerated this well.   2. Syncope, HTN, Headaches -During week 1  of C2 Enhertu she notes episode of syncope.  -She had another episode about a week after C9. Head CT and MRI w/o contrast on 01/29/21 were negative. -intermittent left-sided headaches, Brain MRI 01/06/21 was negative for metastatic disease or acute intracranial abnormality. -she experienced dizziness and nausea after starting tucatinib   3. Peripheral neuropathy, grade 2, Right Foot pain  -Secondary to chemotherapy -I previously stropped Abraxane on 10/13/18.  -She continues on Gabapentin and Cymbalta.   5. 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 Anemia, Secondary to chemo -Continue monitoring, will consider blood transfusion if hemoglobin less than 8. -Hgb stable, 10.8 today (04/10/21)   7. Goal of Care Discussion, Social support -She lives alone, has her sister and niece in [place]. Her daughter lives in [place]. She is currently staying with her daughter and her family. -On Ativan PRN due to anxiety about diagnosis and treatment. She is working with our Education officer, museum to connect with a Social worker. -The patient understands the goal of care is palliative. -She is full code for now    8. Asthma  -secondary to post nasal drip and her Acid reflux, per Dr. Valeta Harms. Will continue medications. Her breathing has improved and adequate now.   -Continue to f/u with Dr Valeta Harms and Dr Collene Mares.       Plan: -proceed with Herceptin infusion today -hold Xeloda until next visit  -Hold Tucatinib for this weekend, restart tucatinib at half-dose (1 tab bid) on 04/13/21 if feeling better -cancel appointment 04/17/21 -phone visit on 04/21/21 for toxicity check, and will increase Tucatinib dose if she tolerates well  -lab and f/u in  3 weeks, will discuss restarting Xeloda at that time.   No problem-specific Assessment & Plan notes found for this encounter.   SUMMARY OF ONCOLOGIC HISTORY: Oncology History Overview Note  Cancer Staging Metastatic breast cancer [facility]) Staging form: Breast, AJCC  8th Edition - Clinical stage from 03/24/2018: Stage IV (cT2, cN1, pM1, G3, ER+, PR+, HER2+) - Signed by [name], MD on 03/30/2018     Metastatic breast cancer ([place])  01/30/2018 Procedure   Colonoscopy showed small polyp in the sigmoid colon, removed, the exam of colon including the terminal ileum was otherwise negative.   01/30/2018 Procedure   EGD by Dr. Collene Mares showed small hiatal hernia, a 8 mm polypoid lesion in the cardia, biopsied.   03/09/2018 Imaging   03/09/2018 US Abdomen IMPRESSION: 1. Mass lesions throughout the liver, consistent with metastatic disease. Liver as a somewhat nodular contour suggesting underlying hepatic cirrhosis. Inhomogeneous echotexture to the liver.   2. Cholelithiasis with mild gallbladder wall thickening. A degree of cholecystitis cannot be excluded by ultrasound.   3. Portions of pancreas obscured by gas. Visualized portions of pancreas appear normal.   4. Small right kidney. Etiology uncertain. This finding potentially may be indicative of renal artery stenosis. In this regard, question whether patient is hypertensive.   03/15/2018 Imaging   CT CAP with contrast  IMPRESSION: 1. Widespread hepatic metastasis. 2. 2.6 cm lateral right breast soft tissue nodule could represent a breast primary or an incidental benign lesion. Consider correlation with mammogram and ultrasound. 3. No definite source of primary malignancy identified within the abdomen or pelvis. There is possible rectosigmoid junction wall thickening. Consider colonoscopy with attention to this area. 4. Distal esophageal wall thickening, suggesting esophagitis.   03/24/2018 Cancer Staging   Staging form: Breast, AJCC 8th Edition - Clinical stage from 03/24/2018: Stage IV (cT2, cN1, pM1, G3, ER+, PR+, HER2+) - Signed by [name], MD on 03/30/2018   03/24/2018 Initial Biopsy   Diagnosis 1. Breast, right, needle core biopsy, 11:30 o'clock, 2cm from nipple - INVASIVE DUCTAL CARCINOMA. - DUCTAL  CARCINOMA IN SITU. -Grade 2  2. Breast, right, needle core biopsy, 9 o'clock, 7cm from nipple - INVASIVE DUCTAL CARCINOMA. -The carcinoma is somewhat morphologically dissimilar from that in part 1. It appears grade III 3. Lymph node, needle/core biopsy, right axillary - METASTATIC CARCINOMA IN 1 OF 1 LYMPH NODE (1/1).   03/24/2018 Receptors her2   Breast biopsy: 1. Estrogen Receptor: 40%, POSITIVE, STRONG-MODERATE STAINING INTENSITY Progesterone Receptor: 70%, POSITIVE, STRONG STAINING INTENSITY Proliferation Marker Ki67: 20% HER 2 equivocal by IHC 2+, POSITIVE by FISH, ratio 2.4 and copy #4.2  2. Estrogen Receptor: 60%, POSITIVE, MODERATE STAINING INTENSITY Progesterone Receptor: 40%, POSITIVE, MODERATE STAINING INTENSITY Proliferation Marker Ki67: 20% HER2 (+) by IHC 3+   03/24/2018 Initial Diagnosis   Metastatic breast cancer ([place])   03/27/2018 Pathology Results   Diagnosis Liver, needle/core biopsy, Right - METASTATIC CARCINOMA TO LIVER, CONSISTENT WITH PATIENTS CLINICAL HISTORY OF PRIMARY BREAST CARCINOMA.  ER 80%+ PR40%+ HER2- (by [facility], IHC 2+)  Ki67 50%    03/28/2018 Pathology Results   03/28/2018 Surgical Pathology Diagnosis 1. Breast, left, needle core biopsy, 9 o'clock - FIBROCYSTIC CHANGES. - THERE IS NO EVIDENCE OF MALIGNANCY. 2. Breast, left, needle core biopsy, 2 o'clock - FIBROADENOMA. - THERE IS NO EVIDENCE OF MALIGNANCY. - SEE COMMENT.   03/29/2018 Imaging   03/29/2018 Bone Scan IMPRESSION: No scintigraphic evidence of osseous metastatic disease.   03/30/2018 Imaging   Bone scan  IMPRESSION: No  scintigraphic evidence of osseous metastatic disease.    04/07/2018 - 07/30/2020 Chemotherapy   First line chemo weekly Taxol and herceptin/Perjeta every 3 weeks starting 04/07/18. She developed infusion reaction to taxol and it was discontinued. Added Abraxane on C1D8, 2 weeks on/1 week off.  Abraxne stopped after 10/13/18 due to worsening Neuropathy. She has continued  with maintenance Herceptin injection/Perjeta q3weeks. Herceptin changed to injection on 04/06/19. Both injections switched to combination [place] on 10/01/19. ----Stopped 07/30/20 due to disease progression in liver.    06/06/2018 Imaging   CT CAP IMPRESSION: 1. Generally improved appearance, with reduced axillary adenopathy and reduced enhancing component of the hepatic masses, with some of the hepatic mass is moderately smaller than on the prior exam. Reduced size of the right lateral breast mass compared to the prior 03/15/2018 exam. 2. New mild interstitial accentuation in the lungs, significance uncertain. Part of this appearance may be due to lower lung volumes on today's exam. 3. Mild wall thickening in the descending colon and upper rectum suggesting low-grade colitis/inflammation. Prominent stool throughout the colon favors constipation. 4. Other imaging findings of potential clinical significance: Aortic Atherosclerosis (ICD10-I70.0). Mild cardiomegaly. Mild nodularity in the right lower lobe appears stable. Contracted and thick-walled gallbladder.     09/18/2018 Imaging   CT CAP W Contrast 09/18/18  IMPRESSION: 1. Liver metastases have decreased in size. 2. Right breast mass, mild right axillary lymphadenopathy and scattered tiny right pulmonary nodules are all stable. 3. New mild left supraclavicular and left subpectoral lymphadenopathy, can not exclude progression of metastatic nodal disease. 4. Moderate colorectal stool volume, which may indicate constipation. 5.  Aortic Atherosclerosis (ICD10-I70.0).   10/2018 - 07/30/2020 Anti-estrogen oral therapy   Letrozole 2.5 mg daily starting 10/2018 D/c to proceed with Inhertu treatment after liver met progression.    01/10/2019 Imaging   CT CAP W Contrast 01/10/19  IMPRESSION: 1. Continued improvement in the hepatic metastatic lesions which have reduced in size. 2. Stable mild left supraclavicular and subpectoral adenopathy. 3.  Essentially stable small right lower lobe pulmonary nodule and separate small subpleural nodule along the right hemidiaphragm. Surveillance suggested. 4. Other imaging findings of potential clinical significance: Mild cardiomegaly. Mild circumferential distal esophageal wall thickening, the most common cause would be esophagitis. Airway thickening is present, suggesting bronchitis or reactive airways disease. Airway plugging in the lower lobes and in the right middle lobe. Stable small right adrenal adenoma.   05/15/2019 Imaging   CT CAP W Contrast 05/15/19  IMPRESSION: CT CHEST IMPRESSION   1. Similar appearance of borderline supraclavicular, axillary, and subpectoral adenopathy. 2. Improved and resolved right lower lobe pulmonary nodularity. 3. Esophageal air fluid level suggests dysmotility or gastroesophageal reflux.   CT ABDOMEN AND PELVIS IMPRESSION   1. Improved hepatic metastasis. 2. Small bowel mesenteric lymph nodes which are upper normal and mildly enlarged. Likely increased and similar as detailed above. Indeterminate. Recommend attention on follow-up. 3. Cholelithiasis. 4. Motion degradation throughout the lower chest and abdomen.   09/19/2019 Imaging   CT CAP W contrast  IMPRESSION: 1. Interval decrease in size of right axillary and subpectoral lymph nodes. There are no pathologically enlarged lymph nodes remaining in the chest, abdomen, or pelvis. No new lymphadenopathy. 2. No significant change in post treatment appearance of multiple low-attenuation liver lesions, in keeping with treated metastases. 3. No evidence of new metastatic disease in the chest, abdomen, or pelvis. 4. Aortic Atherosclerosis (ICD10-I70.0).   11/16/2019 Imaging   IMRI Brain  MPRESSION: Minimal chronic microvascular ischemic changes.  No acute intracranial process.   Active bilateral maxillary sinus disease with mild frontoethmoid mucosal thickening.   01/23/2020 Imaging   CT CAP W  contrast  IMPRESSION: 1. Stable exam. No new or progressive interval findings. 2. Stable appearance of upper normal right axillary lymph nodes. Tiny subpectoral and left axillary nodes are unchanged. 3. Generally similar appearance of ill-defined hypoattenuating lesions in the liver compatible with treated metastases. 1 lesion in the central liver is minimally more conspicuous today, likely related to bolus timing and attention on follow-up recommended. No new suspicious liver lesion on today's study. 4. Stable 10 mm right adrenal nodule., indeterminate. Continued attention on follow-up imaging recommended. 5. Cholelithiasis. 6. Aortic Atherosclerosis (ICD10-I70.0).   02/11/2020 Breast MRI   IMPRESSION: 1. 7 millimeter focus of residual enhancement associated with the known malignancy in the 9 o'clock location of the RIGHT breast. 2. No significant enhancement in the known malignancy in the 11:30 o'clock location of the RIGHT breast. 3. Interval resolution of axillary adenopathy.   07/28/2020 Imaging   CT CAP  IMPRESSION: 1. Interval progression of hepatic metastases. 2. Stable indeterminate right adrenal nodule. 3. No new sites of metastatic disease in the chest, abdomen or pelvis. 4. Chronic findings include: Small hiatal hernia. Cholelithiasis. Aortic Atherosclerosis (ICD10-I70.0).     08/08/2020 -  Chemotherapy   Second-line Inhertu q3weeks starting 08/08/20    01/06/2021 Imaging   MRI Brain  IMPRESSION: No evidence of acute intracranial abnormality.   No evidence of intracranial metastatic disease.   Stable noncontrast MRI appearance of the brain as compared to 11/16/2019.   Mild chronic small vessel ischemic changes within the cerebral white matter.   Redemonstrated tiny chronic lacunar infarct within the right cerebellar hemisphere.   Mild paranasal sinus disease, as described   03/24/2021 Imaging   CT CAP  IMPRESSION: 1. Increase in size and visible number  of hepatic metastatic lesions compatible with progressive malignancy. 2. Other imaging findings of potential clinical significance: Aortic Atherosclerosis (ICD10-I70.0). Small type 1 hiatal hernia. Prominent stool throughout the colon favors constipation. Trace free pelvic fluid, etiology uncertain. Small right adrenal adenoma.   04/10/2021 -  Chemotherapy    Patient is on Treatment Plan: BREAST CAPECITABINE + TRASTUZUMAB + TUCATINIB Q21D          INTERVAL HISTORY:  Teresa Hays is here for a follow up of metastatic breast cancer. She was last seen by me on 03/27/21. She presents to the clinic alone. She notes her brother-in-law brought her here today. She reports she is not feeling well today. She relates this to starting a new medication for BP this past Saturday and started feeling badly on Monday. She notes she feels dizzy and nauseated. We contacted her daughter for clarification on her medications and her sister for more information.   All other systems were reviewed with the patient and are negative.  MEDICAL HISTORY:  Past Medical History:  Diagnosis Date   GERD (gastroesophageal reflux disease)    Hypertension    Personal history of chemotherapy    rt breast ca with mets to liver dx'd 03/2018    SURGICAL HISTORY: Past Surgical History:  Procedure Laterality Date   BREAST BIOPSY Right 03/24/2018   CA x3   BREAST BIOPSY Left 03/28/2018   neg   BREAST BIOPSY Left 03/30/2018   neg   COLONOSCOPY     ESOPHAGOGASTRODUODENOSCOPY ENDOSCOPY     IR IMAGING GUIDED PORT INSERTION  04/04/2018    I have reviewed the social  history and family history with the patient and they are unchanged from previous note.  ALLERGIES:  has No Known Allergies.  MEDICATIONS:  Current Outpatient Medications  Medication Sig Dispense Refill   apixaban (ELIQUIS) 2.5 MG TABS tablet Take 1 tablet (2.5 mg total) by mouth 2 (two) times daily. 60 tablet 1   Budeson-Glycopyrrol-Formoterol  160-9-4.8 MCG/ACT AERO INHALE 2 PUFFS BY MOUTH INTO THE LUNGS IN THE MORNING AND AT BEDTIME. 10.7 g 6   capecitabine (XELODA) 500 MG tablet Take 2 tabs in morning and 3 tabs in evening, 30 mins after meal. Take for 14 days then off for 7 days. 70 tablet 0   carvedilol (COREG) 3.125 MG tablet TAKE 1 TABLET BY MOUTH TWICE DAILY. REPLACES ATENOLOL 180 tablet 3   diphenoxylate-atropine (LOMOTIL) 2.5-0.025 MG tablet Take 1-2 tablets by mouth 4 (four) times daily as needed for diarrhea or loose stools. 30 tablet 0   DULoxetine (CYMBALTA) 20 MG capsule TAKE 2 CAPSULES BY MOUTH DAILY 60 capsule 3   gabapentin (NEURONTIN) 300 MG capsule MAY TAKE UP TO 3 CAPSULES THREE TIMES DAILY BY MOUTH 270 capsule 3   lidocaine (XYLOCAINE) 2 % jelly APPLY OVER PORT A CATH TOPICALLY 1 HOUR BEFORE PORT BEING ACCESSED AS NEEDED 30 mL 2   LORazepam (ATIVAN) 0.5 MG tablet Take 1 tablet (0.5 mg total) by mouth every 8 (eight) hours as needed for anxiety. 30 tablet 0   losartan (COZAAR) 50 MG tablet Take 1 tablet (50 mg total) by mouth daily. 180 tablet 3   meclizine (ANTIVERT) 25 MG tablet TAKE 1/2 TABLET BY MOUTH 3 TIMES DAILY AS NEEDED FOR DIZZINESS 15 tablet 2   montelukast (SINGULAIR) 10 MG tablet TAKE 1 TABLET (10 MG TOTAL) BY MOUTH AT BEDTIME. 30 tablet 5   ondansetron (ZOFRAN) 8 MG tablet TAKE 1 TABLET BY MOUTH EVERY 8 HOURS AS NEEDED FOR NAUSEA OR VOMITING 30 tablet 3   potassium chloride SA (KLOR-CON) 20 MEQ tablet TAKE 1 TABLET BY MOUTH ONCE A DAY 30 tablet 3   prochlorperazine (COMPAZINE) 10 MG tablet TAKE 1 TABLET BY MOUTH EVERY 6 HOURS AS NEEDED FOR NAUSEA OR VOMITING 30 tablet 3   spironolactone (ALDACTONE) 25 MG tablet Take 0.5 tablets (12.5 mg total) by mouth daily. 90 tablet 3   traMADol (ULTRAM) 50 MG tablet Take 1 tablet (50 mg total) by mouth every 6 (six) hours as needed for moderate pain or severe pain. 30 tablet 0   tucatinib (TUKYSA) 150 MG tablet Take 2 tablets (300 mg total) by mouth 2 (two) times  daily. Take every 12 hrs at the same time each day with or without a meal. Take as directed by MD. 120 tablet 0   Zoster Vaccine Adjuvanted (SHINGRIX) injection INJECT 0.5 MLS INTO THE MUSCLE ONCE FOR 1 DOSE. (Patient not taking: Reported on 04/10/2021) 1 each 1   No current facility-administered medications for this visit.   Facility-Administered Medications Ordered in Other Visits  Medication Dose Route Frequency Provider Last Rate Last Admin   sodium chloride flush (NS) 0.9 % injection 10 mL  10 mL Intracatheter PRN [name], MD   10 mL at 05/28/20 0935    PHYSICAL EXAMINATION: ECOG PERFORMANCE STATUS: 2 - Symptomatic, <50% confined to bed  Vitals:   04/10/21 1313  BP: 129/89  Pulse: 77  Resp: 16  Temp: 98 F (36.7 C)  SpO2: 100%   Wt Readings from Last 3 Encounters:  04/10/21 149 lb 8 oz (67.8 kg)  04/10/21 144 lb 9.6 oz (65.6 kg)  03/27/21 150 lb (68 kg)     GENERAL:alert, no distress and comfortable SKIN: skin color normal, no rashes or significant lesions EYES: normal, Conjunctiva are pink and non-injected, sclera clear  NEURO: alert & oriented x 3 with fluent speech  LABORATORY DATA:  I have reviewed the data as listed CBC Latest Ref Rng & Units 04/10/2021 03/27/2021 03/06/2021  WBC 4.0 - 10.5 K/uL 4.9 4.5 5.3  Hemoglobin 12.0 - 15.0 g/dL 10.8(L) 9.5(L) 10.0(L)  Hematocrit 36.0 - 46.0 % 32.4(L) 29.2(L) 31.1(L)  Platelets 150 - 400 K/uL 162 235 202     CMP Latest Ref Rng & Units 04/10/2021 03/27/2021 03/06/2021  Glucose 70 - 99 mg/dL 101(H) 116(H) 123(H)  BUN 8 - 23 mg/dL 25(H) 14 15  Creatinine 0.44 - 1.00 mg/dL 1.23(H) 1.53(H) 1.57(H)  Sodium 135 - 145 mmol/L 131(L) 135 136  Potassium 3.5 - 5.1 mmol/L 4.8 4.4 4.4  Chloride 98 - 111 mmol/L 102 104 103  CO2 22 - 32 mmol/L 20(L) 23 22  Calcium 8.9 - 10.3 mg/dL 10.3 9.9 9.9  Total Protein 6.5 - 8.1 g/dL 7.6 6.7 6.9  Total Bilirubin 0.3 - 1.2 mg/dL 1.2 0.5 0.5  Alkaline Phos 38 - 126 U/L 451(H) 372(H) 276(H)  AST 15  - 41 U/L 79(H) 53(H) 49(H)  ALT 0 - 44 U/L 49(H) 32 26      RADIOGRAPHIC STUDIES: I have personally reviewed the radiological images as listed and agreed with the findings in the report. ECHOCARDIOGRAM COMPLETE  Result Date: 04/10/2021    ECHOCARDIOGRAM REPORT   Patient Name:   MERRI DIMAANO [facility] Date of Exam: 04/10/2021 Medical Rec #:  [id number]           Height:       64.0 in Accession #:    [id number]          Weight:       150.0 lb Date of Birth:  [birth year]          BSA:          1.731 m Patient Age:    43 years            BP:           123/84 mmHg Patient Gender: F                   HR:           80 bpm. Exam Location:  Outpatient Procedure: 2D Echo, 3D Echo and Strain Analysis Indications:    congestive heart failure  History:        Patient has prior history of Echocardiogram examinations, most                 recent 12/26/2020. Cancer.  Sonographer:    Johny Chess RDCS Referring Phys: [place]  1. Left ventricular ejection fraction, by estimation, is 60 to 65%. The left ventricle has normal function. The left ventricle has no regional wall motion abnormalities. Left ventricular diastolic parameters are consistent with Grade I diastolic dysfunction (impaired relaxation).  2. Right ventricular systolic function is normal. The right ventricular size is normal.  3. Left atrial size was mildly dilated.  4. The mitral valve is normal in structure. Trivial mitral valve regurgitation. No evidence of mitral stenosis.  5. The aortic valve is normal in structure. There is mild calcification of the aortic valve. Aortic valve regurgitation is not visualized.  No aortic stenosis is present.  6. The inferior vena cava is normal in size with greater than 50% respiratory variability, suggesting right atrial pressure of 3 mmHg. FINDINGS  Left Ventricle: Left ventricular ejection fraction, by estimation, is 60 to 65%. The left ventricle has normal function. The left ventricle has no  regional wall motion abnormalities. The left ventricular internal cavity size was normal in size. There is  no left ventricular hypertrophy. Left ventricular diastolic parameters are consistent with Grade I diastolic dysfunction (impaired relaxation). Right Ventricle: The right ventricular size is normal. No increase in right ventricular wall thickness. Right ventricular systolic function is normal. Left Atrium: Left atrial size was mildly dilated. Right Atrium: Right atrial size was normal in size. Pericardium: There is no evidence of pericardial effusion. Mitral Valve: The mitral valve is normal in structure. Trivial mitral valve regurgitation. No evidence of mitral valve stenosis. Tricuspid Valve: The tricuspid valve is normal in structure. Tricuspid valve regurgitation is trivial. No evidence of tricuspid stenosis. Aortic Valve: The aortic valve is normal in structure. There is mild calcification of the aortic valve. Aortic valve regurgitation is not visualized. No aortic stenosis is present. Pulmonic Valve: The pulmonic valve was normal in structure. Pulmonic valve regurgitation is not visualized. No evidence of pulmonic stenosis. Aorta: The aortic root is normal in size and structure. Venous: The inferior vena cava is normal in size with greater than 50% respiratory variability, suggesting right atrial pressure of 3 mmHg. IAS/Shunts: No atrial level shunt detected by color flow Doppler.  LEFT VENTRICLE PLAX 2D LVIDd:         3.70 cm  Diastology LVIDs:         2.50 cm  LV e' medial:    4.68 cm/s LV PW:         0.80 cm  LV E/e' medial:  7.2 LV IVS:        0.90 cm  LV e' lateral:   12.70 cm/s LVOT diam:     1.70 cm  LV E/e' lateral: 2.7 LV SV:         38 LV SV Index:   22 LVOT Area:     2.27 cm  RIGHT VENTRICLE RV S prime:     13.20 cm/s LEFT ATRIUM             Index       RIGHT ATRIUM           Index LA diam:        3.40 cm 1.96 cm/m  RA Area:     10.40 cm LA Vol (A2C):   29.4 ml 16.98 ml/m RA Volume:    20.80 ml  12.01 ml/m LA Vol (A4C):   29.5 ml 17.04 ml/m LA Biplane Vol: 29.4 ml 16.98 ml/m  AORTIC VALVE LVOT Vmax:   96.80 cm/s LVOT Vmean:  63.400 cm/s LVOT VTI:    0.169 m  AORTA Ao Root diam: 2.80 cm Ao Asc diam:  3.00 cm MV E velocity: 33.90 cm/s MV A velocity: 87.50 cm/s  SHUNTS MV E/A ratio:  0.39        Systemic VTI:  0.17 m                            Systemic Diam: 1.70 cm Glori Bickers MD Electronically signed by Glori Bickers MD Signature Date/Time: 04/10/2021/10:09:48 AM    Final       No orders of the defined  types were placed in this encounter.  All questions were answered. The patient knows to call the clinic with any problems, questions or concerns. No barriers to learning was detected. The total time spent in the appointment was 30 minutes.     [name], MD 04/10/2021   I, Wilburn Mylar, am acting as scribe for [name], MD.   I have reviewed the above documentation for accuracy and completeness, and I agree with the above.

## 2021-04-10 NOTE — Progress Notes (Signed)
Cardio-Oncology Clinic Note    Date:  04/10/2021   ID:  Quaneisha, Hanisch 1956-05-21, MRN [id number]  Location: Home  Provider location: [facility] Type of Visit: New patient  PCP:  [name], DO  Cardiologist:  None  Referring: Dr. Burr Medico   History of Present Illness:  Teresa Hays is a 65 y/o woman with GERD, HTN and metastatic breast CA referred by Dr. Burr Medico for enrollment into the Cardio-Oncology program.   SUMMARY OF ONCOLOGIC HISTORY: Cancer Staging Metastatic breast cancer [facility]) Staging form: Breast, AJCC 8th Edition - Clinical stage from 03/24/2018: Stage IV (cT2, cN1, pM1, G3, ER+, PR+, HER2+) - Signed by [name], MD on 03/30/2018        Metastatic breast cancer ([place])   01/30/2018 Procedure     Colonoscopy showed small polyp in the sigmoid colon, removed, the exam of colon including the terminal ileum was otherwise negative.     01/30/2018 Procedure     EGD by Dr. Collene Mares showed small hiatal hernia, a 8 mm polypoid lesion in the cardia, biopsied.     03/09/2018 Imaging     03/09/2018 US Abdomen IMPRESSION: 1. Mass lesions throughout the liver, consistent with metastatic disease. Liver as a somewhat nodular contour suggesting underlying hepatic cirrhosis. Inhomogeneous echotexture to the liver.   2. Cholelithiasis with mild gallbladder wall thickening. A degree of cholecystitis cannot be excluded by ultrasound.   3. Portions of pancreas obscured by gas. Visualized portions of pancreas appear normal.   4. Small right kidney. Etiology uncertain. This finding potentially may be indicative of renal artery stenosis. In this regard, question whether patient is hypertensive.     03/15/2018 Imaging     CT CAP with contrast  IMPRESSION: 1. Widespread hepatic metastasis. 2. 2.6 cm lateral right breast soft tissue nodule could represent a breast primary or an incidental benign lesion. Consider correlation with mammogram and  ultrasound. 3. No definite source of primary malignancy identified within the abdomen or pelvis. There is possible rectosigmoid junction wall thickening. Consider colonoscopy with attention to this area. 4. Distal esophageal wall thickening, suggesting esophagitis.     03/24/2018 Cancer Staging     Staging form: Breast, AJCC 8th Edition - Clinical stage from 03/24/2018: Stage IV (cT2, cN1, pM1, G3, ER+, PR+, HER2+) - Signed by [name], MD on 03/30/2018     03/24/2018 Initial Biopsy     Diagnosis 1. 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 Breast, right, needle core biopsy, 9 o'clock, 7cm from nipple - INVASIVE DUCTAL CARCINOMA. -The carcinoma is somewhat morphologically dissimilar from that in part 1. It appears grade III 3. Lymph node, needle/core biopsy, right axillary - METASTATIC CARCINOMA IN 1 OF 1 LYMPH NODE (1/1).     03/24/2018 Receptors her2     Breast biopsy: 1. Estrogen Receptor: 40%, POSITIVE, STRONG-MODERATE STAINING INTENSITY Progesterone Receptor: 70%, POSITIVE, STRONG STAINING INTENSITY Proliferation Marker Ki67: 20% HER 2 equivocal by IHC 2+, POSITIVE by FISH, ratio 2.4 and copy #4.2   2. Estrogen Receptor: 60%, POSITIVE, MODERATE STAINING INTENSITY Progesterone Receptor: 40%, POSITIVE, MODERATE STAINING INTENSITY Proliferation Marker Ki67: 20% HER2 (+) by IHC 3+     03/24/2018 Initial Diagnosis     Metastatic breast cancer ([place])     03/27/2018 Pathology Results     Diagnosis Liver, needle/core biopsy, Right - METASTATIC CARCINOMA TO LIVER, CONSISTENT WITH PATIENTS  CLINICAL HISTORY OF PRIMARY BREAST CARCINOMA.   ER 80%+ PR40%+ HER2- (by [facility], IHC 2+)  Ki67 50%      03/28/2018 Pathology Results     03/28/2018 Surgical Pathology Diagnosis 1. Breast, left, needle core biopsy, 9 o'clock - FIBROCYSTIC CHANGES. - THERE IS NO EVIDENCE OF MALIGNANCY. 2. Breast, left, needle core  biopsy, 2 o'clock - FIBROADENOMA. - THERE IS NO EVIDENCE OF MALIGNANCY. - SEE COMMENT.     03/29/2018 Imaging     03/29/2018 Bone Scan IMPRESSION: No scintigraphic evidence of osseous metastatic disease.     03/30/2018 Imaging     Bone scan  IMPRESSION: No scintigraphic evidence of osseous metastatic disease.      04/07/2018 - 07/30/2020 Chemotherapy     First line chemo weekly Taxol and herceptin/Perjeta every 3 weeks starting 04/07/18. She developed infusion reaction to taxol and it was discontinued. Added Abraxane on C1D8, 2 weeks on/1 week off.  Abraxne stopped after 10/13/18 due to worsening Neuropathy. She has continued with maintenance Herceptin injection/Perjeta q3weeks. Herceptin changed to injection on 04/06/19. Both injections switched to combination [place] on 10/01/19. ----Stopped 07/30/20 due to disease progression in liver.      06/06/2018 Imaging     CT CAP IMPRESSION: 1. Generally improved appearance, with reduced axillary adenopathy and reduced enhancing component of the hepatic masses, with some of the hepatic mass is moderately smaller than on the prior exam. Reduced size of the right lateral breast mass compared to the prior 03/15/2018 exam. 2. New mild interstitial accentuation in the lungs, significance uncertain. Part of this appearance may be due to lower lung volumes on today's exam. 3. Mild wall thickening in the descending colon and upper rectum suggesting low-grade colitis/inflammation. Prominent stool throughout the colon favors constipation. 4. Other imaging findings of potential clinical significance: Aortic Atherosclerosis (ICD10-I70.0). Mild cardiomegaly. Mild nodularity in the right lower lobe appears stable. Contracted and thick-walled gallbladder.       09/18/2018 Imaging     CT CAP W Contrast 09/18/18  IMPRESSION: 1. Liver metastases have decreased in size. 2. Right breast mass, mild right axillary lymphadenopathy and scattered tiny right pulmonary nodules are  all stable. 3. New mild left supraclavicular and left subpectoral lymphadenopathy, can not exclude progression of metastatic nodal disease. 4. Moderate colorectal stool volume, which may indicate constipation. 5.  Aortic Atherosclerosis (ICD10-I70.0).     10/2018 - 07/30/2020 Anti-estrogen oral therapy     Letrozole 2.5 mg daily starting 10/2018 D/c to proceed with Inhertu treatment after liver met progression.      01/10/2019 Imaging     CT CAP W Contrast 01/10/19  IMPRESSION: 1. Continued improvement in the hepatic metastatic lesions which have reduced in size. 2. Stable mild left supraclavicular and subpectoral adenopathy. 3. Essentially stable small right lower lobe pulmonary nodule and separate small subpleural nodule along the right hemidiaphragm. Surveillance suggested. 4. Other imaging findings of potential clinical significance: Mild cardiomegaly. Mild circumferential distal esophageal wall thickening, the most common cause would be esophagitis. Airway thickening is present, suggesting bronchitis or reactive airways disease. Airway plugging in the lower lobes and in the right middle lobe. Stable small right adrenal adenoma.     05/15/2019 Imaging     CT CAP W Contrast 05/15/19  IMPRESSION: CT CHEST IMPRESSION   1. Similar appearance of borderline supraclavicular, axillary, and subpectoral adenopathy. 2. Improved and resolved right lower lobe pulmonary nodularity. 3. Esophageal air fluid level suggests dysmotility or gastroesophageal reflux.   CT ABDOMEN AND PELVIS IMPRESSION  1. Improved hepatic metastasis. 2. Small bowel mesenteric lymph nodes which are upper normal and mildly enlarged. Likely increased and similar as detailed above. Indeterminate. Recommend attention on follow-up. 3. Cholelithiasis. 4. Motion degradation throughout the lower chest and abdomen.     09/19/2019 Imaging     CT CAP W contrast  IMPRESSION: 1. Interval decrease in size of right  axillary and subpectoral lymph nodes. There are no pathologically enlarged lymph nodes remaining in the chest, abdomen, or pelvis. No new lymphadenopathy. 2. No significant change in post treatment appearance of multiple low-attenuation liver lesions, in keeping with treated metastases. 3. No evidence of new metastatic disease in the chest, abdomen, or pelvis. 4. Aortic Atherosclerosis (ICD10-I70.0).     11/16/2019 Imaging     IMRI Brain  MPRESSION: Minimal chronic microvascular ischemic changes. No acute intracranial process.   Active bilateral maxillary sinus disease with mild frontoethmoid mucosal thickening.     01/23/2020 Imaging     CT CAP W contrast  IMPRESSION: 1. Stable exam. No new or progressive interval findings. 2. Stable appearance of upper normal right axillary lymph nodes. Tiny subpectoral and left axillary nodes are unchanged. 3. Generally similar appearance of ill-defined hypoattenuating lesions in the liver compatible with treated metastases. 1 lesion in the central liver is minimally more conspicuous today, likely related to bolus timing and attention on follow-up recommended. No new suspicious liver lesion on today's study. 4. Stable 10 mm right adrenal nodule., indeterminate. Continued attention on follow-up imaging recommended. 5. Cholelithiasis. 6. Aortic Atherosclerosis (ICD10-I70.0).     02/11/2020 Breast MRI     IMPRESSION: 1. 7 millimeter focus of residual enhancement associated with the known malignancy in the 9 o'clock location of the RIGHT breast. 2. No significant enhancement in the known malignancy in the 11:30 o'clock location of the RIGHT breast. 3. Interval resolution of axillary adenopathy.     07/28/2020 Imaging     CT CAP  IMPRESSION: 1. Interval progression of hepatic metastases. 2. Stable indeterminate right adrenal nodule. 3. No new sites of metastatic disease in the chest, abdomen or pelvis. 4. Chronic findings include: Small  hiatal hernia. Cholelithiasis. Aortic Atherosclerosis (ICD10-I70.0).       08/08/2020 -  Chemotherapy     Second-line Inhertu q3weeks starting 08/08/20       01/06/2021 Imaging     MRI Brain   IMPRESSION: No evidence of acute intracranial abnormality.   No evidence of intracranial metastatic disease.   Stable noncontrast MRI appearance of the brain as compared to 11/16/2019.   Mild chronic small vessel ischemic changes within the cerebral white matter.   Redemonstrated tiny chronic lacunar infarct within the right cerebellar hemisphere.   Mild paranasal sinus disease, as described     03/24/2021 Imaging     CT CAP   IMPRESSION: 1. Increase in size and visible number of hepatic metastatic lesions compatible with progressive malignancy. 2. Other imaging findings of potential clinical significance: Aortic Atherosclerosis (ICD10-I70.0). Small type 1 hiatal hernia. Prominent stool throughout the colon favors constipation. Trace free pelvic fluid, etiology uncertain. Small right adrenal adenoma.     03/30/2021 -  Chemotherapy      Patient is on Treatment Plan: BREAST CAPECITABINE + TRASTUZUMAB + TUCATINIB Q21D      She presents today for routine f/u. Restaging CT CAP on 03/24/21 showed progressive hepatic metastasis. Regimen changed tooral Xeloda, tucatinib, and IV trastuzumab. Tolerating ok. Feels nauseated. No CP, SOB, orthopnea or PND.   Echo today 04/10/21 EF 60-65% G1  DD Personally reviewed  Echo 6/22 EF 60-65% G1 DD    Echo 04/23/20 04/23/20 EF 50-55%  Echo 5/21 EF 50-55% GLS -15.0%  Echo 2/21 EF 55-60% GLS - 15.8%  Echo 11/20 EF 55% GLS -17.9%  Echo 9/20 EF 50-55% GLS - 14.4% Echo 02/27/19 EF 50% GLS -21.4%  Echo 07/28/18 EF 60-65% GLS -19.2% Echo 12/1018  EF 55-60% GLS -16.1%     Past Medical History:  Diagnosis Date   GERD (gastroesophageal reflux disease)    Hypertension    Personal history of chemotherapy    rt breast ca with mets to liver dx'd 03/2018   Past  Surgical History:  Procedure Laterality Date   BREAST BIOPSY Right 03/24/2018   CA x3   BREAST BIOPSY Left 03/28/2018   neg   BREAST BIOPSY Left 03/30/2018   neg   COLONOSCOPY     ESOPHAGOGASTRODUODENOSCOPY ENDOSCOPY     IR IMAGING GUIDED PORT INSERTION  04/04/2018     Current Outpatient Medications  Medication Sig Dispense Refill   amLODipine (NORVASC) 5 MG tablet TAKE 1 TABLET BY MOUTH ONCE DAILY 30 tablet 2   apixaban (ELIQUIS) 2.5 MG TABS tablet Take 1 tablet (2.5 mg total) by mouth 2 (two) times daily. 60 tablet 1   Budeson-Glycopyrrol-Formoterol 160-9-4.8 MCG/ACT AERO INHALE 2 PUFFS BY MOUTH INTO THE LUNGS IN THE MORNING AND AT BEDTIME. 10.7 g 6   capecitabine (XELODA) 500 MG tablet Take 2 tabs in morning and 3 tabs in evening, 30 mins after meal. Take for 14 days then off for 7 days. 70 tablet 0   carvedilol (COREG) 3.125 MG tablet TAKE 1 TABLET BY MOUTH TWICE DAILY. REPLACES ATENOLOL 180 tablet 3   diphenoxylate-atropine (LOMOTIL) 2.5-0.025 MG tablet Take 1-2 tablets by mouth 4 (four) times daily as needed for diarrhea or loose stools. 30 tablet 0   DULoxetine (CYMBALTA) 20 MG capsule TAKE 2 CAPSULES BY MOUTH DAILY 60 capsule 3   gabapentin (NEURONTIN) 300 MG capsule MAY TAKE UP TO 3 CAPSULES THREE TIMES DAILY BY MOUTH 270 capsule 3   lidocaine (XYLOCAINE) 2 % jelly APPLY OVER PORT A CATH TOPICALLY 1 HOUR BEFORE PORT BEING ACCESSED AS NEEDED 30 mL 2   LORazepam (ATIVAN) 0.5 MG tablet Take 1 tablet (0.5 mg total) by mouth every 8 (eight) hours as needed for anxiety. 30 tablet 0   losartan (COZAAR) 50 MG tablet Take 2 tablets (100 mg total) by mouth daily. 180 tablet 3   meclizine (ANTIVERT) 25 MG tablet TAKE 1/2 TABLET BY MOUTH 3 TIMES DAILY AS NEEDED FOR DIZZINESS 15 tablet 2   montelukast (SINGULAIR) 10 MG tablet TAKE 1 TABLET (10 MG TOTAL) BY MOUTH AT BEDTIME. 30 tablet 5   ondansetron (ZOFRAN) 8 MG tablet TAKE 1 TABLET BY MOUTH EVERY 8 HOURS AS NEEDED FOR NAUSEA OR VOMITING 30  tablet 3   potassium chloride SA (KLOR-CON) 20 MEQ tablet TAKE 1 TABLET BY MOUTH ONCE A DAY 30 tablet 3   prochlorperazine (COMPAZINE) 10 MG tablet TAKE 1 TABLET BY MOUTH EVERY 6 HOURS AS NEEDED FOR NAUSEA OR VOMITING 30 tablet 3   spironolactone (ALDACTONE) 25 MG tablet TAKE 1 TABLET BY MOUTH ONCE DAILY 90 tablet 3   traMADol (ULTRAM) 50 MG tablet Take 1 tablet (50 mg total) by mouth every 6 (six) hours as needed for moderate pain or severe pain. 30 tablet 0   tucatinib (TUKYSA) 150 MG tablet Take 2 tablets (300 mg total) by mouth 2 (two) times  daily. Take every 12 hrs at the same time each day with or without a meal. Take as directed by MD. 120 tablet 0   Zoster Vaccine Adjuvanted (SHINGRIX) injection INJECT 0.5 MLS INTO THE MUSCLE ONCE FOR 1 DOSE. (Patient not taking: Reported on 04/10/2021) 1 each 1   No current facility-administered medications for this encounter.   Facility-Administered Medications Ordered in Other Encounters  Medication Dose Route Frequency Provider Last Rate Last Admin   sodium chloride flush (NS) 0.9 % injection 10 mL  10 mL Intracatheter PRN [name], MD   10 mL at 05/28/20 0935    Allergies:   Patient has no known allergies.   Social History:  The patient  reports that she has never smoked. She has never used smokeless tobacco. She reports that she does not drink alcohol and does not use drugs.   Family History:  The patient's family history includes Breast cancer in her sister; Cancer (age of onset: 37) in her sister; Diabetes in her mother; Rheum arthritis in her sister.   ROS:  Please see the history of present illness.   All other systems are personally reviewed and negative.   Vitals:   04/10/21 1001  BP: 102/64  Pulse: 82  SpO2: 99%  Weight: 65.6 kg (144 lb 9.6 oz)    Exam:   General:  Well appearing. No resp difficulty HEENT: normal Neck: supple. no JVD. Carotids 2+ bilat; no bruits. No lymphadenopathy or thryomegaly appreciated. Cor: PMI  nondisplaced. Regular rate & rhythm. No rubs, gallops or murmurs. Lungs: clear Abdomen: soft, nontender, nondistended. No hepatosplenomegaly. No bruits or masses. Good bowel sounds. Extremities: no cyanosis, clubbing, rash, edema Neuro: alert & orientedx3, cranial nerves grossly intact. moves all 4 extremities w/o difficulty. Affect pleasant   Recent Labs: 09/05/2020: Magnesium 1.8 03/27/2021: ALT 32; BUN 14; Creatinine 1.53; Hemoglobin 9.5; Platelet Count 235; Potassium 4.4; Sodium 135  Personally reviewed   Wt Readings from Last 3 Encounters:  04/10/21 65.6 kg (144 lb 9.6 oz)  03/27/21 68 kg (150 lb)  03/06/21 68 kg (150 lb)      ASSESSMENT AND PLAN:  1. Breast cancer, metastatic - She has underwent first-line weekly Taxol with Herceptin/Perjeta every 3 weeks starting 04/07/18. She developed infusion reaction to taxol and it was discontinued. Changed to Abraxane weekly 2 weeks on/1 week off from 07/21/2018.  Abraxane held after October 13, 2018 due to his worsening neuropathy. Herceptin/Perjeta switched to Enhertu in 1/22. Now on Letrozole 2.5 mg daily starting 10/2018. Switched back to Enhertu in 4/22 - Echo 8/20 EF down to 45-50% suspicious for mild Herceptin cardiotoxicity.  - Herceptin/perjeta held  for one dose.  - Echo 08/20/20 EF 55-60% Grade 1DD GLS -19.4% (switched to Enhertu 1/22) - Echo 12/26/20 EF 60-65% G1 DD - Echo today 04/10/21 EF 60-65% G1 DD - Now back on Herceptin. BP low.  - Continue carvedilol 3.125 bid - Decrease losartan to 50 mg daily - Decrease spiro to 12.5 daily  2. HTN  - Blood pressure low.  - Plan as above  3. RUE DVT - continue Xarelto. No bleeding.    Glori Bickers, MD  04/10/2021 10:11 AM  [facility] [place] [address] [phone] (office) [phone] (fax)

## 2021-04-10 NOTE — Progress Notes (Signed)
  Echocardiogram 2D Echocardiogram has been performed.  Teresa Hays 04/10/2021, 10:04 AM

## 2021-04-10 NOTE — Patient Instructions (Signed)
Stop Amlodipine  Decrease Losartan to 50 mg Daily  Decrease Spironolactone to 12.5 mg (1/2 tab) Daily  Your physician recommends that you schedule a follow-up appointment in: 3 months with echocardiogram  If you have any questions or concerns before your next appointment please send [place] a message through [place] or call our office at [phone].    TO LEAVE A MESSAGE FOR THE NURSE SELECT OPTION 2, PLEASE LEAVE A MESSAGE INCLUDING: YOUR NAME DATE OF BIRTH CALL BACK NUMBER REASON FOR CALL**this is important as we prioritize the call backs  YOU WILL RECEIVE A CALL BACK THE SAME DAY AS LONG AS YOU CALL BEFORE 4:00 PM  At the [facility], you and your health needs are our priority. As part of our continuing mission to provide you with exceptional heart care, we have created designated Provider Care Teams. These Care Teams include your primary Cardiologist (physician) and Advanced Practice Providers (APPs- Physician Assistants and Nurse Practitioners) who all work together to provide you with the care you need, when you need it.   You may see any of the following providers on your designated Care Team at your next follow up: Dr Glori Bickers Dr Loralie Champagne Dr Patrice Paradise, NP Lyda Jester, [place] Ginnie Smart Audry Riles, PharmD   Please be sure to bring in all your medications bottles to every appointment.

## 2021-04-11 ENCOUNTER — Encounter: Payer: Self-pay | Admitting: Hematology

## 2021-04-13 ENCOUNTER — Encounter: Payer: Self-pay | Admitting: General Practice

## 2021-04-13 NOTE — Progress Notes (Signed)
[place] Spiritual Care Note  Missed Teresa Hays at her treatment last week, so phoned today for pastoral check-in. She reports feeling better today after initial side effects and has just gotten back into town, so plans to check her mail for [place] team/programming packet. We plan to follow up at her next treatment on 10/14.   [place], [place], [facility] Pager [phone] Voicemail [phone]

## 2021-04-16 ENCOUNTER — Ambulatory Visit (INDEPENDENT_AMBULATORY_CARE_PROVIDER_SITE_OTHER): Payer: Medicare Other | Admitting: Psychologist

## 2021-04-16 ENCOUNTER — Other Ambulatory Visit (HOSPITAL_COMMUNITY): Payer: Self-pay

## 2021-04-16 DIAGNOSIS — F320 Major depressive disorder, single episode, mild: Secondary | ICD-10-CM | POA: Diagnosis not present

## 2021-04-17 ENCOUNTER — Ambulatory Visit: Payer: Medicare Other | Admitting: Hematology

## 2021-04-17 ENCOUNTER — Other Ambulatory Visit: Payer: Medicare Other

## 2021-04-18 ENCOUNTER — Other Ambulatory Visit: Payer: Self-pay | Admitting: Nurse Practitioner

## 2021-04-18 DIAGNOSIS — G620 Drug-induced polyneuropathy: Secondary | ICD-10-CM

## 2021-04-20 ENCOUNTER — Other Ambulatory Visit (HOSPITAL_COMMUNITY): Payer: Self-pay

## 2021-04-20 MED ORDER — GABAPENTIN 300 MG PO CAPS
ORAL_CAPSULE | ORAL | 3 refills | Status: DC
  Filled 2021-04-20: qty 270, 30d supply, fill #0
  Filled 2021-05-18: qty 270, 30d supply, fill #1

## 2021-04-21 ENCOUNTER — Inpatient Hospital Stay: Payer: Medicare Other | Attending: Hematology | Admitting: Hematology

## 2021-04-21 DIAGNOSIS — J320 Chronic maxillary sinusitis: Secondary | ICD-10-CM | POA: Insufficient documentation

## 2021-04-21 DIAGNOSIS — D3501 Benign neoplasm of right adrenal gland: Secondary | ICD-10-CM | POA: Insufficient documentation

## 2021-04-21 DIAGNOSIS — K2100 Gastro-esophageal reflux disease with esophagitis, without bleeding: Secondary | ICD-10-CM | POA: Insufficient documentation

## 2021-04-21 DIAGNOSIS — K449 Diaphragmatic hernia without obstruction or gangrene: Secondary | ICD-10-CM | POA: Insufficient documentation

## 2021-04-21 DIAGNOSIS — C50919 Malignant neoplasm of unspecified site of unspecified female breast: Secondary | ICD-10-CM | POA: Diagnosis not present

## 2021-04-21 DIAGNOSIS — I700 Atherosclerosis of aorta: Secondary | ICD-10-CM | POA: Insufficient documentation

## 2021-04-21 DIAGNOSIS — Z79899 Other long term (current) drug therapy: Secondary | ICD-10-CM | POA: Insufficient documentation

## 2021-04-21 DIAGNOSIS — K8020 Calculus of gallbladder without cholecystitis without obstruction: Secondary | ICD-10-CM | POA: Insufficient documentation

## 2021-04-21 DIAGNOSIS — I6782 Cerebral ischemia: Secondary | ICD-10-CM | POA: Insufficient documentation

## 2021-04-21 DIAGNOSIS — G620 Drug-induced polyneuropathy: Secondary | ICD-10-CM | POA: Insufficient documentation

## 2021-04-21 DIAGNOSIS — R42 Dizziness and giddiness: Secondary | ICD-10-CM | POA: Insufficient documentation

## 2021-04-21 DIAGNOSIS — Z5112 Encounter for antineoplastic immunotherapy: Secondary | ICD-10-CM | POA: Insufficient documentation

## 2021-04-21 DIAGNOSIS — K5900 Constipation, unspecified: Secondary | ICD-10-CM | POA: Insufficient documentation

## 2021-04-21 DIAGNOSIS — Z170 Estrogen receptor positive status [ER+]: Secondary | ICD-10-CM | POA: Insufficient documentation

## 2021-04-21 DIAGNOSIS — J45909 Unspecified asthma, uncomplicated: Secondary | ICD-10-CM | POA: Insufficient documentation

## 2021-04-21 DIAGNOSIS — R634 Abnormal weight loss: Secondary | ICD-10-CM | POA: Insufficient documentation

## 2021-04-21 DIAGNOSIS — R55 Syncope and collapse: Secondary | ICD-10-CM | POA: Insufficient documentation

## 2021-04-21 DIAGNOSIS — Z7901 Long term (current) use of anticoagulants: Secondary | ICD-10-CM | POA: Insufficient documentation

## 2021-04-21 DIAGNOSIS — C787 Secondary malignant neoplasm of liver and intrahepatic bile duct: Secondary | ICD-10-CM | POA: Insufficient documentation

## 2021-04-21 DIAGNOSIS — C50811 Malignant neoplasm of overlapping sites of right female breast: Secondary | ICD-10-CM | POA: Insufficient documentation

## 2021-04-21 DIAGNOSIS — Z86718 Personal history of other venous thrombosis and embolism: Secondary | ICD-10-CM | POA: Insufficient documentation

## 2021-04-21 DIAGNOSIS — Z79811 Long term (current) use of aromatase inhibitors: Secondary | ICD-10-CM | POA: Insufficient documentation

## 2021-04-21 DIAGNOSIS — T451X5A Adverse effect of antineoplastic and immunosuppressive drugs, initial encounter: Secondary | ICD-10-CM | POA: Insufficient documentation

## 2021-04-21 DIAGNOSIS — I129 Hypertensive chronic kidney disease with stage 1 through stage 4 chronic kidney disease, or unspecified chronic kidney disease: Secondary | ICD-10-CM | POA: Insufficient documentation

## 2021-04-21 NOTE — Progress Notes (Signed)
[facility]   [phone] [phone]   Clinic Follow up Note   Patient Care Team: Lavonda Jumbo, DO as PCP - General (Family Medicine) Charna Elizabeth, MD as Consulting Physician (Gastroenterology) Malachy Mood, MD as Consulting Physician (Hematology)  Date of Service:  04/21/2021  I connected with Teresa Hays on 04/21/21 at  8:40 AM EDT by telephone and verified that I am speaking with the correct person using two identifiers.   I discussed the limitations, risks, security and privacy concerns of performing an evaluation and management service by telephone and the availability of in person appointments. I also discussed with the patient that there may be a patient responsible charge related to this service. The patient expressed understanding and agreed to proceed.   Patient's location:  Home  Provider's location:  Office    CHIEF COMPLAINT: f/u of metastatic breast cancer  CURRENT THERAPY:  -Tucatinib and Xeloda started 04/04/21, held due to side effects 04/10/21, restarted Tucatinib on 04/13/2021 -Herceptin restarted 04/10/21  ASSESSMENT & PLAN:  Teresa Hays is a 65 y.o. female with   1. 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 She presented with diffuse liver metastasis, two right breast masses and right axillary adenopathy. Breast mass biopsy showed ER PR positive, but 1 was HER-2 positive, the other one was HER-2 negative. Liver met was HER2-. -She was treated with letrozole, trastuzumab and Perjeta, until disease progression on 07/28/20 CT CAP. -She switched to Enhertu q3weeks starting 08/08/20. Given poor toleration with C1 (nausea, diarrhea, poor appetite), dose reduced with C2 and she tolerated much better.  -restaging CT CAP on 03/24/21 showed progressive hepatic metastasis. -We changed her treatment to oral Xeloda, tucatinib, and IV trastuzumab. -Given her  CKD with [id number], I will reduce Xeloda dose by 25%, she will take 1000 mg in the morning, 1500 mg in the evening, day 1-14 every 21 days -She began tucatinib and Xeloda on 04/04/21 and developed dizziness and nausea. I held both for the weekend. She restarted her tucatinib on Monday, 04/13/21, at half-dose and increased to full dose yesterday, well so far. -Follow-up next week for cycle 2 Herceptin, at Xeloda at a low dose in the near future.   2. Syncope, HTN, Headaches -During week 1 of C2 Enhertu she notes episode of syncope.  -She had another episode about a week after C9. Head CT and MRI w/o contrast on 01/29/21 were negative. -intermittent left-sided headaches, Brain MRI 01/06/21 was negative for metastatic disease or acute intracranial abnormality. -she experienced dizziness and nausea after starting tucatinib   3. Peripheral neuropathy, grade 2, Right Foot pain  -Secondary to chemotherapy -I previously stropped Abraxane on 10/13/18.  -She continues on Gabapentin and Cymbalta.  -Her neuropathy has improved overall, she will try to reduce Cymbalta from 40 mg to 20 mg daily   5. Left UE DVT (08/04/18), On Eliquis 2.5mg  bid indefinitely   6. Anemia, Secondary to chemo -Continue monitoring, will consider blood transfusion if hemoglobin less than 8. -Hgb stable   7. Goal of Care Discussion, Social support -She lives alone, has her sister and niece in [place]. Her daughter lives in [place]. She is currently staying with her daughter and her family. -On Ativan PRN due to anxiety about diagnosis and treatment. She is working with our Child psychotherapist to connect with a Veterinary surgeon. -The patient understands the goal of care is palliative. -She is full code for now  8. Asthma  -secondary to post nasal drip and her Acid reflux, per Dr. Valeta Harms. Will continue medications. Her breathing has improved and adequate now.   -Continue to f/u with Dr Valeta Harms and Dr Collene Mares.       Plan: -She doing much better  overall, continue Tucatinib 300mg  bid, and continue holding Xeloda -f/u next week before Herceptin infusion  -She will try to decrease Cymbalta from 40 mg daily to 20 mg daily   No problem-specific Assessment & Plan notes found for this encounter.   SUMMARY OF ONCOLOGIC HISTORY: Oncology History Overview Note  Cancer Staging Metastatic breast cancer [facility]) Staging form: Breast, AJCC 8th Edition - Clinical stage from 03/24/2018: Stage IV (cT2, cN1, pM1, G3, ER+, PR+, HER2+) - Signed by [name], MD on 03/30/2018     Metastatic breast cancer ([place])  01/30/2018 Procedure   Colonoscopy showed small polyp in the sigmoid colon, removed, the exam of colon including the terminal ileum was otherwise negative.   01/30/2018 Procedure   EGD by Dr. Collene Mares showed small hiatal hernia, a 8 mm polypoid lesion in the cardia, biopsied.   03/09/2018 Imaging   03/09/2018 US Abdomen IMPRESSION: 1. Mass lesions throughout the liver, consistent with metastatic disease. Liver as a somewhat nodular contour suggesting underlying hepatic cirrhosis. Inhomogeneous echotexture to the liver.   2. Cholelithiasis with mild gallbladder wall thickening. A degree of cholecystitis cannot be excluded by ultrasound.   3. Portions of pancreas obscured by gas. Visualized portions of pancreas appear normal.   4. Small right kidney. Etiology uncertain. This finding potentially may be indicative of renal artery stenosis. In this regard, question whether patient is hypertensive.   03/15/2018 Imaging   CT CAP with contrast  IMPRESSION: 1. Widespread hepatic metastasis. 2. 2.6 cm lateral right breast soft tissue nodule could represent a breast primary or an incidental benign lesion. Consider correlation with mammogram and ultrasound. 3. No definite source of primary malignancy identified within the abdomen or pelvis. There is possible rectosigmoid junction wall thickening. Consider colonoscopy with attention to this area. 4. Distal  esophageal wall thickening, suggesting esophagitis.   03/24/2018 Cancer Staging   Staging form: Breast, AJCC 8th Edition - Clinical stage from 03/24/2018: Stage IV (cT2, cN1, pM1, G3, ER+, PR+, HER2+) - Signed by [name], MD on 03/30/2018   03/24/2018 Initial Biopsy   Diagnosis 1. 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 Breast, right, needle core biopsy, 9 o'clock, 7cm from nipple - INVASIVE DUCTAL CARCINOMA. -The carcinoma is somewhat morphologically dissimilar from that in part 1. It appears grade III 3. Lymph node, needle/core biopsy, right axillary - METASTATIC CARCINOMA IN 1 OF 1 LYMPH NODE (1/1).   03/24/2018 Receptors her2   Breast biopsy: 1. Estrogen Receptor: 40%, POSITIVE, STRONG-MODERATE STAINING INTENSITY Progesterone Receptor: 70%, POSITIVE, STRONG STAINING INTENSITY Proliferation Marker Ki67: 20% HER 2 equivocal by IHC 2+, POSITIVE by FISH, ratio 2.4 and copy #4.2  2. Estrogen Receptor: 60%, POSITIVE, MODERATE STAINING INTENSITY Progesterone Receptor: 40%, POSITIVE, MODERATE STAINING INTENSITY Proliferation Marker Ki67: 20% HER2 (+) by IHC 3+   03/24/2018 Initial Diagnosis   Metastatic breast cancer ([place])   03/27/2018 Pathology Results   Diagnosis Liver, needle/core biopsy, Right - METASTATIC CARCINOMA TO LIVER, CONSISTENT WITH PATIENTS CLINICAL HISTORY OF PRIMARY BREAST CARCINOMA.  ER 80%+ PR40%+ HER2- (by [facility], IHC 2+)  Ki67 50%    03/28/2018 Pathology Results   03/28/2018 Surgical  Pathology Diagnosis 1. Breast, left, needle core biopsy, 9 o'clock - FIBROCYSTIC CHANGES. - THERE IS NO EVIDENCE OF MALIGNANCY. 2. Breast, left, needle core biopsy, 2 o'clock - FIBROADENOMA. - THERE IS NO EVIDENCE OF MALIGNANCY. - SEE COMMENT.   03/29/2018 Imaging   03/29/2018 Bone Scan IMPRESSION: No scintigraphic evidence of osseous metastatic disease.   03/30/2018 Imaging   Bone  scan  IMPRESSION: No scintigraphic evidence of osseous metastatic disease.    04/07/2018 - 07/30/2020 Chemotherapy   First line chemo weekly Taxol and herceptin/Perjeta every 3 weeks starting 04/07/18. She developed infusion reaction to taxol and it was discontinued. Added Abraxane on C1D8, 2 weeks on/1 week off.  Abraxne stopped after 10/13/18 due to worsening Neuropathy. She has continued with maintenance Herceptin injection/Perjeta q3weeks. Herceptin changed to injection on 04/06/19. Both injections switched to combination [place] on 10/01/19. ----Stopped 07/30/20 due to disease progression in liver.    06/06/2018 Imaging   CT CAP IMPRESSION: 1. Generally improved appearance, with reduced axillary adenopathy and reduced enhancing component of the hepatic masses, with some of the hepatic mass is moderately smaller than on the prior exam. Reduced size of the right lateral breast mass compared to the prior 03/15/2018 exam. 2. New mild interstitial accentuation in the lungs, significance uncertain. Part of this appearance may be due to lower lung volumes on today's exam. 3. Mild wall thickening in the descending colon and upper rectum suggesting low-grade colitis/inflammation. Prominent stool throughout the colon favors constipation. 4. Other imaging findings of potential clinical significance: Aortic Atherosclerosis (ICD10-I70.0). Mild cardiomegaly. Mild nodularity in the right lower lobe appears stable. Contracted and thick-walled gallbladder.     09/18/2018 Imaging   CT CAP W Contrast 09/18/18  IMPRESSION: 1. Liver metastases have decreased in size. 2. Right breast mass, mild right axillary lymphadenopathy and scattered tiny right pulmonary nodules are all stable. 3. New mild left supraclavicular and left subpectoral lymphadenopathy, can not exclude progression of metastatic nodal disease. 4. Moderate colorectal stool volume, which may indicate constipation. 5.  Aortic Atherosclerosis  (ICD10-I70.0).   10/2018 - 07/30/2020 Anti-estrogen oral therapy   Letrozole 2.5 mg daily starting 10/2018 D/c to proceed with Inhertu treatment after liver met progression.    01/10/2019 Imaging   CT CAP W Contrast 01/10/19  IMPRESSION: 1. Continued improvement in the hepatic metastatic lesions which have reduced in size. 2. Stable mild left supraclavicular and subpectoral adenopathy. 3. Essentially stable small right lower lobe pulmonary nodule and separate small subpleural nodule along the right hemidiaphragm. Surveillance suggested. 4. Other imaging findings of potential clinical significance: Mild cardiomegaly. Mild circumferential distal esophageal wall thickening, the most common cause would be esophagitis. Airway thickening is present, suggesting bronchitis or reactive airways disease. Airway plugging in the lower lobes and in the right middle lobe. Stable small right adrenal adenoma.   05/15/2019 Imaging   CT CAP W Contrast 05/15/19  IMPRESSION: CT CHEST IMPRESSION   1. Similar appearance of borderline supraclavicular, axillary, and subpectoral adenopathy. 2. Improved and resolved right lower lobe pulmonary nodularity. 3. Esophageal air fluid level suggests dysmotility or gastroesophageal reflux.   CT ABDOMEN AND PELVIS IMPRESSION   1. Improved hepatic metastasis. 2. Small bowel mesenteric lymph nodes which are upper normal and mildly enlarged. Likely increased and similar as detailed above. Indeterminate. Recommend attention on follow-up. 3. Cholelithiasis. 4. Motion degradation throughout the lower chest and abdomen.   09/19/2019 Imaging   CT CAP W contrast  IMPRESSION: 1. Interval decrease in size of right axillary and subpectoral lymph  nodes. There are no pathologically enlarged lymph nodes remaining in the chest, abdomen, or pelvis. No new lymphadenopathy. 2. No significant change in post treatment appearance of multiple low-attenuation liver lesions, in keeping  with treated metastases. 3. No evidence of new metastatic disease in the chest, abdomen, or pelvis. 4. Aortic Atherosclerosis (ICD10-I70.0).   11/16/2019 Imaging   IMRI Brain  MPRESSION: Minimal chronic microvascular ischemic changes. No acute intracranial process.   Active bilateral maxillary sinus disease with mild frontoethmoid mucosal thickening.   01/23/2020 Imaging   CT CAP W contrast  IMPRESSION: 1. Stable exam. No new or progressive interval findings. 2. Stable appearance of upper normal right axillary lymph nodes. Tiny subpectoral and left axillary nodes are unchanged. 3. Generally similar appearance of ill-defined hypoattenuating lesions in the liver compatible with treated metastases. 1 lesion in the central liver is minimally more conspicuous today, likely related to bolus timing and attention on follow-up recommended. No new suspicious liver lesion on today's study. 4. Stable 10 mm right adrenal nodule., indeterminate. Continued attention on follow-up imaging recommended. 5. Cholelithiasis. 6. Aortic Atherosclerosis (ICD10-I70.0).   02/11/2020 Breast MRI   IMPRESSION: 1. 7 millimeter focus of residual enhancement associated with the known malignancy in the 9 o'clock location of the RIGHT breast. 2. No significant enhancement in the known malignancy in the 11:30 o'clock location of the RIGHT breast. 3. Interval resolution of axillary adenopathy.   07/28/2020 Imaging   CT CAP  IMPRESSION: 1. Interval progression of hepatic metastases. 2. Stable indeterminate right adrenal nodule. 3. No new sites of metastatic disease in the chest, abdomen or pelvis. 4. Chronic findings include: Small hiatal hernia. Cholelithiasis. Aortic Atherosclerosis (ICD10-I70.0).     08/08/2020 -  Chemotherapy   Second-line Inhertu q3weeks starting 08/08/20    01/06/2021 Imaging   MRI Brain  IMPRESSION: No evidence of acute intracranial abnormality.   No evidence of intracranial  metastatic disease.   Stable noncontrast MRI appearance of the brain as compared to 11/16/2019.   Mild chronic small vessel ischemic changes within the cerebral white matter.   Redemonstrated tiny chronic lacunar infarct within the right cerebellar hemisphere.   Mild paranasal sinus disease, as described   03/24/2021 Imaging   CT CAP  IMPRESSION: 1. Increase in size and visible number of hepatic metastatic lesions compatible with progressive malignancy. 2. Other imaging findings of potential clinical significance: Aortic Atherosclerosis (ICD10-I70.0). Small type 1 hiatal hernia. Prominent stool throughout the colon favors constipation. Trace free pelvic fluid, etiology uncertain. Small right adrenal adenoma.   04/10/2021 -  Chemotherapy    Patient is on Treatment Plan: BREAST CAPECITABINE + TRASTUZUMAB + TUCATINIB Q21D          INTERVAL HISTORY:  Teresa Hays is scheduled for [facility] visit for follow-up.  She invited her niece to the conversation, she has been helping her medication management at home.   Teresa Hays is doing well overall, her appetite and energy level has recovered.  She tolerates low-dose decanted well last week without any nausea, diarrhea, or other noticeable side effects.  She did increase the dose to 4 tablets yesterday, and tolerating well so far.  She has been staying in the home since last visit, able to function fully at home.  All other systems were reviewed with the patient and are negative.  MEDICAL HISTORY:  Past Medical History:  Diagnosis Date   GERD (gastroesophageal reflux disease)    Hypertension    Personal history of chemotherapy    rt breast ca with  mets to liver dx'd 03/2018    SURGICAL HISTORY: Past Surgical History:  Procedure Laterality Date   BREAST BIOPSY Right 03/24/2018   CA x3   BREAST BIOPSY Left 03/28/2018   neg   BREAST BIOPSY Left 03/30/2018   neg   COLONOSCOPY     ESOPHAGOGASTRODUODENOSCOPY ENDOSCOPY     IR  IMAGING GUIDED PORT INSERTION  04/04/2018    I have reviewed the social history and family history with the patient and they are unchanged from previous note.  ALLERGIES:  has No Known Allergies.  MEDICATIONS:  Current Outpatient Medications  Medication Sig Dispense Refill   apixaban (ELIQUIS) 2.5 MG TABS tablet Take 1 tablet (2.5 mg total) by mouth 2 (two) times daily. 60 tablet 1   Budeson-Glycopyrrol-Formoterol 160-9-4.8 MCG/ACT AERO INHALE 2 PUFFS BY MOUTH INTO THE LUNGS IN THE MORNING AND AT BEDTIME. 10.7 g 6   capecitabine (XELODA) 500 MG tablet Take 2 tabs in morning and 3 tabs in evening, 30 mins after meal. Take for 14 days then off for 7 days. 70 tablet 0   carvedilol (COREG) 3.125 MG tablet TAKE 1 TABLET BY MOUTH TWICE DAILY. REPLACES ATENOLOL 180 tablet 3   diphenoxylate-atropine (LOMOTIL) 2.5-0.025 MG tablet Take 1-2 tablets by mouth 4 (four) times daily as needed for diarrhea or loose stools. 30 tablet 0   DULoxetine (CYMBALTA) 20 MG capsule TAKE 2 CAPSULES BY MOUTH DAILY 60 capsule 3   gabapentin (NEURONTIN) 300 MG capsule MAY TAKE UP TO 3 CAPSULES BY MOUTH 3 TIMES DAILY 270 capsule 3   lidocaine (XYLOCAINE) 2 % jelly APPLY OVER PORT A CATH TOPICALLY 1 HOUR BEFORE PORT BEING ACCESSED AS NEEDED 30 mL 2   LORazepam (ATIVAN) 0.5 MG tablet Take 1 tablet (0.5 mg total) by mouth every 8 (eight) hours as needed for anxiety. 30 tablet 0   losartan (COZAAR) 50 MG tablet Take 1 tablet (50 mg total) by mouth daily. 180 tablet 3   meclizine (ANTIVERT) 25 MG tablet TAKE 1/2 TABLET BY MOUTH 3 TIMES DAILY AS NEEDED FOR DIZZINESS 15 tablet 2   montelukast (SINGULAIR) 10 MG tablet TAKE 1 TABLET (10 MG TOTAL) BY MOUTH AT BEDTIME. 30 tablet 5   ondansetron (ZOFRAN) 8 MG tablet TAKE 1 TABLET BY MOUTH EVERY 8 HOURS AS NEEDED FOR NAUSEA OR VOMITING 30 tablet 3   potassium chloride SA (KLOR-CON) 20 MEQ tablet TAKE 1 TABLET BY MOUTH ONCE A DAY 30 tablet 3   prochlorperazine (COMPAZINE) 10 MG tablet  TAKE 1 TABLET BY MOUTH EVERY 6 HOURS AS NEEDED FOR NAUSEA OR VOMITING 30 tablet 3   spironolactone (ALDACTONE) 25 MG tablet Take 0.5 tablets (12.5 mg total) by mouth daily. 90 tablet 3   traMADol (ULTRAM) 50 MG tablet Take 1 tablet (50 mg total) by mouth every 6 (six) hours as needed for moderate pain or severe pain. 30 tablet 0   tucatinib (TUKYSA) 150 MG tablet Take 2 tablets (300 mg total) by mouth 2 (two) times daily. Take every 12 hrs at the same time each day with or without a meal. Take as directed by MD. 120 tablet 0   Zoster Vaccine Adjuvanted (SHINGRIX) injection INJECT 0.5 MLS INTO THE MUSCLE ONCE FOR 1 DOSE. (Patient not taking: Reported on 04/10/2021) 1 each 1   No current facility-administered medications for this visit.   Facility-Administered Medications Ordered in Other Visits  Medication Dose Route Frequency Provider Last Rate Last Admin   sodium chloride flush (NS) 0.9 % injection  10 mL  10 mL Intracatheter PRN [name], MD   10 mL at 05/28/20 0935    PHYSICAL EXAMINATION: ECOG PERFORMANCE STATUS: 1  There were no vitals filed for this visit.  Wt Readings from Last 3 Encounters:  04/10/21 144 lb 9.6 oz (65.6 kg)  04/10/21 149 lb 8 oz (67.8 kg)  03/27/21 150 lb (68 kg)    No exam   LABORATORY DATA:  I have reviewed the data as listed CBC Latest Ref Rng & Units 04/10/2021 03/27/2021 03/06/2021  WBC 4.0 - 10.5 K/uL 4.9 4.5 5.3  Hemoglobin 12.0 - 15.0 g/dL 10.8(L) 9.5(L) 10.0(L)  Hematocrit 36.0 - 46.0 % 32.4(L) 29.2(L) 31.1(L)  Platelets 150 - 400 K/uL 162 235 202     CMP Latest Ref Rng & Units 04/10/2021 03/27/2021 03/06/2021  Glucose 70 - 99 mg/dL 101(H) 116(H) 123(H)  BUN 8 - 23 mg/dL 25(H) 14 15  Creatinine 0.44 - 1.00 mg/dL 1.23(H) 1.53(H) 1.57(H)  Sodium 135 - 145 mmol/L 131(L) 135 136  Potassium 3.5 - 5.1 mmol/L 4.8 4.4 4.4  Chloride 98 - 111 mmol/L 102 104 103  CO2 22 - 32 mmol/L 20(L) 23 22  Calcium 8.9 - 10.3 mg/dL 10.3 9.9 9.9  Total Protein 6.5 - 8.1  g/dL 7.6 6.7 6.9  Total Bilirubin 0.3 - 1.2 mg/dL 1.2 0.5 0.5  Alkaline Phos 38 - 126 U/L 451(H) 372(H) 276(H)  AST 15 - 41 U/L 79(H) 53(H) 49(H)  ALT 0 - 44 U/L 49(H) 32 26      RADIOGRAPHIC STUDIES: I have personally reviewed the radiological images as listed and agreed with the findings in the report. No results found.    No orders of the defined types were placed in this encounter.   I discussed the assessment and treatment plan with the patient. The patient was provided an opportunity to ask questions and all were answered. The patient agreed with the plan and demonstrated an understanding of the instructions.   The patient was advised to call back or seek an in-person evaluation if the symptoms worsen or if the condition fails to improve as anticipated.  I provided 22 minutes of non face-to-face telephone visit time during this encounter, and > 50% was spent counseling as documented under my assessment & plan.     [name], MD 04/21/2021

## 2021-04-27 ENCOUNTER — Other Ambulatory Visit: Payer: Self-pay | Admitting: Family Medicine

## 2021-04-27 ENCOUNTER — Other Ambulatory Visit (HOSPITAL_COMMUNITY): Payer: Self-pay

## 2021-04-27 DIAGNOSIS — I10 Essential (primary) hypertension: Secondary | ICD-10-CM

## 2021-04-27 MED ORDER — AMLODIPINE BESYLATE 5 MG PO TABS
ORAL_TABLET | Freq: Every day | ORAL | 2 refills | Status: DC
Start: 2021-04-27 — End: 2021-05-31
  Filled 2021-04-27: qty 30, 30d supply, fill #0

## 2021-04-28 ENCOUNTER — Encounter: Payer: Self-pay | Admitting: Hematology

## 2021-04-30 ENCOUNTER — Ambulatory Visit: Payer: Medicare Other | Admitting: Psychologist

## 2021-05-01 ENCOUNTER — Inpatient Hospital Stay: Payer: Medicare Other

## 2021-05-01 ENCOUNTER — Other Ambulatory Visit (HOSPITAL_COMMUNITY): Payer: Self-pay

## 2021-05-01 ENCOUNTER — Ambulatory Visit: Payer: Medicare Other | Admitting: Hematology

## 2021-05-01 ENCOUNTER — Other Ambulatory Visit: Payer: Medicare Other

## 2021-05-01 ENCOUNTER — Inpatient Hospital Stay (HOSPITAL_BASED_OUTPATIENT_CLINIC_OR_DEPARTMENT_OTHER): Payer: Medicare Other | Admitting: Hematology

## 2021-05-01 ENCOUNTER — Ambulatory Visit: Payer: Medicare Other

## 2021-05-01 ENCOUNTER — Encounter: Payer: Self-pay | Admitting: Hematology

## 2021-05-01 ENCOUNTER — Other Ambulatory Visit: Payer: Self-pay

## 2021-05-01 VITALS — BP 120/79 | HR 68 | Temp 97.7°F | Resp 18 | Ht 64.0 in | Wt 145.7 lb

## 2021-05-01 DIAGNOSIS — Z79899 Other long term (current) drug therapy: Secondary | ICD-10-CM | POA: Diagnosis not present

## 2021-05-01 DIAGNOSIS — J45909 Unspecified asthma, uncomplicated: Secondary | ICD-10-CM | POA: Diagnosis not present

## 2021-05-01 DIAGNOSIS — I700 Atherosclerosis of aorta: Secondary | ICD-10-CM | POA: Diagnosis not present

## 2021-05-01 DIAGNOSIS — K8020 Calculus of gallbladder without cholecystitis without obstruction: Secondary | ICD-10-CM | POA: Diagnosis not present

## 2021-05-01 DIAGNOSIS — C50919 Malignant neoplasm of unspecified site of unspecified female breast: Secondary | ICD-10-CM

## 2021-05-01 DIAGNOSIS — R55 Syncope and collapse: Secondary | ICD-10-CM | POA: Diagnosis not present

## 2021-05-01 DIAGNOSIS — J320 Chronic maxillary sinusitis: Secondary | ICD-10-CM | POA: Diagnosis not present

## 2021-05-01 DIAGNOSIS — Z79811 Long term (current) use of aromatase inhibitors: Secondary | ICD-10-CM | POA: Diagnosis not present

## 2021-05-01 DIAGNOSIS — Z7189 Other specified counseling: Secondary | ICD-10-CM

## 2021-05-01 DIAGNOSIS — R634 Abnormal weight loss: Secondary | ICD-10-CM | POA: Diagnosis not present

## 2021-05-01 DIAGNOSIS — I129 Hypertensive chronic kidney disease with stage 1 through stage 4 chronic kidney disease, or unspecified chronic kidney disease: Secondary | ICD-10-CM | POA: Diagnosis not present

## 2021-05-01 DIAGNOSIS — D3501 Benign neoplasm of right adrenal gland: Secondary | ICD-10-CM | POA: Diagnosis not present

## 2021-05-01 DIAGNOSIS — Z95828 Presence of other vascular implants and grafts: Secondary | ICD-10-CM

## 2021-05-01 DIAGNOSIS — Z7901 Long term (current) use of anticoagulants: Secondary | ICD-10-CM | POA: Diagnosis not present

## 2021-05-01 DIAGNOSIS — Z170 Estrogen receptor positive status [ER+]: Secondary | ICD-10-CM | POA: Diagnosis not present

## 2021-05-01 DIAGNOSIS — K449 Diaphragmatic hernia without obstruction or gangrene: Secondary | ICD-10-CM | POA: Diagnosis not present

## 2021-05-01 DIAGNOSIS — C787 Secondary malignant neoplasm of liver and intrahepatic bile duct: Secondary | ICD-10-CM | POA: Diagnosis not present

## 2021-05-01 DIAGNOSIS — I6782 Cerebral ischemia: Secondary | ICD-10-CM | POA: Diagnosis not present

## 2021-05-01 DIAGNOSIS — T451X5A Adverse effect of antineoplastic and immunosuppressive drugs, initial encounter: Secondary | ICD-10-CM | POA: Diagnosis not present

## 2021-05-01 DIAGNOSIS — K2100 Gastro-esophageal reflux disease with esophagitis, without bleeding: Secondary | ICD-10-CM | POA: Diagnosis not present

## 2021-05-01 DIAGNOSIS — C50811 Malignant neoplasm of overlapping sites of right female breast: Secondary | ICD-10-CM | POA: Diagnosis present

## 2021-05-01 DIAGNOSIS — R42 Dizziness and giddiness: Secondary | ICD-10-CM | POA: Diagnosis not present

## 2021-05-01 DIAGNOSIS — Z86718 Personal history of other venous thrombosis and embolism: Secondary | ICD-10-CM | POA: Diagnosis not present

## 2021-05-01 DIAGNOSIS — Z5112 Encounter for antineoplastic immunotherapy: Secondary | ICD-10-CM | POA: Diagnosis present

## 2021-05-01 DIAGNOSIS — K5900 Constipation, unspecified: Secondary | ICD-10-CM | POA: Diagnosis not present

## 2021-05-01 DIAGNOSIS — G620 Drug-induced polyneuropathy: Secondary | ICD-10-CM | POA: Diagnosis not present

## 2021-05-01 LAB — CBC WITH DIFFERENTIAL (CANCER CENTER ONLY)
Abs Immature Granulocytes: 0.01 10*3/uL (ref 0.00–0.07)
Basophils Absolute: 0.1 10*3/uL (ref 0.0–0.1)
Basophils Relative: 2 %
Eosinophils Absolute: 0.5 10*3/uL (ref 0.0–0.5)
Eosinophils Relative: 11 %
HCT: 30.9 % — ABNORMAL LOW (ref 36.0–46.0)
Hemoglobin: 10.6 g/dL — ABNORMAL LOW (ref 12.0–15.0)
Immature Granulocytes: 0 %
Lymphocytes Relative: 37 %
Lymphs Abs: 1.7 10*3/uL (ref 0.7–4.0)
MCH: 30.1 pg (ref 26.0–34.0)
MCHC: 34.3 g/dL (ref 30.0–36.0)
MCV: 87.8 fL (ref 80.0–100.0)
Monocytes Absolute: 0.4 10*3/uL (ref 0.1–1.0)
Monocytes Relative: 8 %
Neutro Abs: 1.9 10*3/uL (ref 1.7–7.7)
Neutrophils Relative %: 42 %
Platelet Count: 137 10*3/uL — ABNORMAL LOW (ref 150–400)
RBC: 3.52 MIL/uL — ABNORMAL LOW (ref 3.87–5.11)
RDW: 17.6 % — ABNORMAL HIGH (ref 11.5–15.5)
WBC Count: 4.5 10*3/uL (ref 4.0–10.5)
nRBC: 0 % (ref 0.0–0.2)

## 2021-05-01 LAB — CMP (CANCER CENTER ONLY)
ALT: 44 U/L (ref 0–44)
AST: 74 U/L — ABNORMAL HIGH (ref 15–41)
Albumin: 3.9 g/dL (ref 3.5–5.0)
Alkaline Phosphatase: 282 U/L — ABNORMAL HIGH (ref 38–126)
Anion gap: 9 (ref 5–15)
BUN: 19 mg/dL (ref 8–23)
CO2: 22 mmol/L (ref 22–32)
Calcium: 9.9 mg/dL (ref 8.9–10.3)
Chloride: 101 mmol/L (ref 98–111)
Creatinine: 1.53 mg/dL — ABNORMAL HIGH (ref 0.44–1.00)
GFR, Estimated: 38 mL/min — ABNORMAL LOW (ref >60)
Glucose, Bld: 134 mg/dL — ABNORMAL HIGH (ref 70–99)
Potassium: 4.1 mmol/L (ref 3.5–5.1)
Sodium: 132 mmol/L — ABNORMAL LOW (ref 135–145)
Total Bilirubin: 1 mg/dL (ref 0.3–1.2)
Total Protein: 7.8 g/dL (ref 6.5–8.1)

## 2021-05-01 MED ORDER — SODIUM CHLORIDE 0.9% FLUSH
10.0000 mL | INTRAVENOUS | Status: DC | PRN
  Administered 2021-05-01: 10 mL

## 2021-05-01 MED ORDER — DIPHENHYDRAMINE HCL 25 MG PO CAPS
50.0000 mg | ORAL_CAPSULE | Freq: Once | ORAL | Status: AC
  Administered 2021-05-01: 50 mg via ORAL
  Filled 2021-05-01: qty 2

## 2021-05-01 MED ORDER — ACETAMINOPHEN 325 MG PO TABS
650.0000 mg | ORAL_TABLET | Freq: Once | ORAL | Status: AC
  Administered 2021-05-01: 650 mg via ORAL
  Filled 2021-05-01: qty 2

## 2021-05-01 MED ORDER — SODIUM CHLORIDE 0.9 % IV SOLN: Freq: Once | INTRAVENOUS | Status: AC

## 2021-05-01 MED ORDER — TRASTUZUMAB-DKST CHEMO 150 MG IV SOLR
6.0000 mg/kg | Freq: Once | INTRAVENOUS | Status: AC
  Administered 2021-05-01: 399 mg via INTRAVENOUS
  Filled 2021-05-01: qty 19

## 2021-05-01 MED ORDER — HEPARIN SOD (PORK) LOCK FLUSH 100 UNIT/ML IV SOLN
500.0000 [IU] | Freq: Once | INTRAVENOUS | Status: AC | PRN
  Administered 2021-05-01: 500 [IU]

## 2021-05-01 MED ORDER — TUCATINIB 150 MG PO TABS
300.0000 mg | ORAL_TABLET | Freq: Two times a day (BID) | ORAL | 2 refills | Status: DC
  Filled 2021-05-01 (×2): qty 120, 30d supply, fill #0

## 2021-05-01 NOTE — Patient Instructions (Signed)
[facility]  Discharge Instructions: Thank you for choosing [facility] to provide your oncology and hematology care.   If you have a lab appointment with the [facility], please go directly to the [facility] and check in at the registration area.   Wear comfortable clothing and clothing appropriate for easy access to any Portacath or PICC line.   We strive to give you quality time with your provider. You may need to reschedule your appointment if you arrive late (15 or more minutes).  Arriving late affects you and other patients whose appointments are after yours.  Also, if you miss three or more appointments without notifying the office, you may be dismissed from the clinic at the provider's discretion.      For prescription refill requests, have your pharmacy contact our office and allow 72 hours for refills to be completed.    Today you received the following chemotherapy and/or immunotherapy agents: Herceptin      To help prevent nausea and vomiting after your treatment, we encourage you to take your nausea medication as directed.  BELOW ARE SYMPTOMS THAT SHOULD BE REPORTED IMMEDIATELY: *FEVER GREATER THAN 100.4 F (38 C) OR HIGHER *CHILLS OR SWEATING *NAUSEA AND VOMITING THAT IS NOT CONTROLLED WITH YOUR NAUSEA MEDICATION *UNUSUAL SHORTNESS OF BREATH *UNUSUAL BRUISING OR BLEEDING *URINARY PROBLEMS (pain or burning when urinating, or frequent urination) *BOWEL PROBLEMS (unusual diarrhea, constipation, pain near the anus) TENDERNESS IN MOUTH AND THROAT WITH OR WITHOUT PRESENCE OF ULCERS (sore throat, sores in mouth, or a toothache) UNUSUAL RASH, SWELLING OR PAIN  UNUSUAL VAGINAL DISCHARGE OR ITCHING   Items with * indicate a potential emergency and should be followed up as soon as possible or go to the Emergency Department if any problems should occur.  Please show the CHEMOTHERAPY ALERT CARD or IMMUNOTHERAPY ALERT CARD at check-in to  the Emergency Department and triage nurse.  Should you have questions after your visit or need to cancel or reschedule your appointment, please contact [facility]  Dept: [phone]  and follow the prompts.  Office hours are 8:00 a.m. to 4:30 p.m. Monday - Friday. Please note that voicemails left after 4:00 p.m. may not be returned until the following business day.  We are closed weekends and major holidays. You have access to a nurse at all times for urgent questions. Please call the main number to the clinic Dept: [phone] and follow the prompts.   For any non-urgent questions, you may also contact your provider using MyChart. We now offer e-Visits for anyone 65 and older to request care online for non-urgent symptoms. For details visit [email or web address].   Also download the MyChart app! Go to the app store, search "MyChart", open the app, select [facility], and log in with your MyChart username and password.  Due to Covid, a mask is required upon entering the hospital/clinic. If you do not have a mask, one will be given to you upon arrival. For doctor visits, patients may have 1 support person aged 56 or older with them. For treatment visits, patients cannot have anyone with them due to current Covid guidelines and our immunocompromised population.   Influenza Virus Vaccine injection What is this medication? INFLUENZA VIRUS VACCINE (in floo EN zuh VAHY ruhs vak SEEN) helps to reduce the risk of getting influenza also known as the flu. The vaccine only helps protect you against some strains of the flu. This medicine may be used  for other purposes; ask your health care provider or pharmacist if you have questions. COMMON BRAND NAME(S): Afluria, Afluria Quadrivalent, Agriflu, Alfuria, FLUAD, FLUAD Quadrivalent, Fluarix, Fluarix Quadrivalent, Flublok, Flublok Quadrivalent, FLUCELVAX, FLUCELVAX Quadrivalent, Flulaval, Flulaval Quadrivalent, Fluvirin, Fluzone,  Fluzone High-Dose, Fluzone Intradermal, Fluzone Quadrivalent What should I tell my care team before I take this medication? They need to know if you have any of these conditions: bleeding disorder like hemophilia fever or infection Guillain-Barre syndrome or other neurological problems immune system problems infection with the human immunodeficiency virus (HIV) or AIDS low blood platelet counts multiple sclerosis an unusual or allergic reaction to influenza virus vaccine, latex, other medicines, foods, dyes, or preservatives. Different brands of vaccines contain different allergens. Some may contain latex or eggs. Talk to your doctor about your allergies to make sure that you get the right vaccine. pregnant or trying to get pregnant breast-feeding How should I use this medication? This vaccine is for injection into a muscle or under the skin. It is given by a health care professional. A copy of Vaccine Information Statements will be given before each vaccination. Read this sheet carefully each time. The sheet may change frequently. Talk to your healthcare provider to see which vaccines are right for you. Some vaccines should not be used in all age groups. Overdosage: If you think you have taken too much of this medicine contact a poison control center or emergency room at once. NOTE: This medicine is only for you. Do not share this medicine with others. What if I miss a dose? This does not apply. What may interact with this medication? chemotherapy or radiation therapy medicines that lower your immune system like etanercept, anakinra, infliximab, and adalimumab medicines that treat or prevent blood clots like warfarin phenytoin steroid medicines like prednisone or cortisone theophylline vaccines This list may not describe all possible interactions. Give your health care provider a list of all the medicines, herbs, non-prescription drugs, or dietary supplements you use. Also tell them if  you smoke, drink alcohol, or use illegal drugs. Some items may interact with your medicine. What should I watch for while using this medication? Report any side effects that do not go away within 3 days to your doctor or health care professional. Call your health care provider if any unusual symptoms occur within 6 weeks of receiving this vaccine. You may still catch the flu, but the illness is not usually as bad. You cannot get the flu from the vaccine. The vaccine will not protect against colds or other illnesses that may cause fever. The vaccine is needed every year. What side effects may I notice from receiving this medication? Side effects that you should report to your doctor or health care professional as soon as possible: allergic reactions like skin rash, itching or hives, swelling of the face, lips, or tongue Side effects that usually do not require medical attention (report to your doctor or health care professional if they continue or are bothersome): fever headache muscle aches and pains pain, tenderness, redness, or swelling at the injection site tiredness Side effects that you should report to your doctor or health care professional as soon as possible: allergic reactions like skin rash, itching or hives, swelling of the face, lips, or tongue Side effects that usually do not require medical attention (report to your doctor or health care professional if they continue or are bothersome): fever headache muscle aches and pains pain, tenderness, redness, or swelling at the injection site tiredness This list may not  describe all possible side effects. Call your doctor for medical advice about side effects. You may report side effects to FDA at 1-800-FDA-1088. Where should I keep my medication? The vaccine will be given by a health care professional in a clinic, pharmacy, doctor's office, or other health care setting. You will not be given vaccine doses to store at home. NOTE: This  sheet is a summary. It may not cover all possible information. If you have questions about this medicine, talk to your doctor, pharmacist, or health care provider.  2022 [employer] Standard (2020-03-11 19:49:22)

## 2021-05-01 NOTE — Progress Notes (Signed)
[place]   [phone] Fax:([phone]) [phone]   Clinic Follow up Note   Patient Care Team: [name], DO as PCP - General (Family Medicine) [name], MD as Consulting Physician (Gastroenterology) [name], MD as Consulting Physician (Hematology)  Date of Service:  05/01/2021  CHIEF COMPLAINT: f/u of metastatic breast cancer  CURRENT THERAPY:  -Tucatinib and Xeloda started 04/04/21, held due to side effects 04/10/21, restarted Tucatinib on 04/13/2021 -Herceptin restarted 04/10/21  ASSESSMENT & PLAN:  Teresa Hays is a 65 y.o. female with   1. 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 She presented with diffuse liver metastasis, two right breast masses and right axillary adenopathy. Breast mass biopsy showed ER PR positive, but 1 was HER-2 positive, the other one was HER-2 negative. Liver met was HER2-. -She was treated with letrozole, trastuzumab and Perjeta, until disease progression on 07/28/20 CT CAP. -She switched to Enhertu q3weeks starting 08/08/20. Given poor toleration with C1 (nausea, diarrhea, poor appetite), dose reduced with C2 and she tolerated much better.  -restaging CT CAP on 03/24/21 showed progressive hepatic metastasis. -We changed her treatment to oral Xeloda, tucatinib, and IV trastuzumab. -Given her CKD with [id number], I will reduce Xeloda dose by 25%, she will take 1000 mg in the morning, 1500 mg in the evening, day 1-14 every 21 days -She began tucatinib and Xeloda on 04/04/21 and developed dizziness and nausea. I held both for the weekend. She restarted her tucatinib on Monday, 04/13/21, at half-dose and increased to full dose 04/20/21. She is tolerating well so far. -We will continue to hold the Xeloda for now. We will plan for her to restart at low-dose (500 mg q12hr) on 05/18/21, and she will take 7 days on/7 days off. I will see her on 05/22/21 (day  5). -Labs reviewed, overall adequate to proceed with C2 Herceptin today.   2. Syncope, HTN, Headaches -During week 1 of C2 Enhertu she notes episode of syncope.  -She had another episode about a week after C9. Head CT and MRI w/o contrast on 01/29/21 were negative. -intermittent left-sided headaches, Brain MRI 01/06/21 was negative for metastatic disease or acute intracranial abnormality. -she experienced dizziness and nausea after starting tucatinib   3. Peripheral neuropathy, grade 2, Right Foot pain  -Secondary to chemotherapy -I previously stropped Abraxane on 10/13/18.  -She continues on Gabapentin and Cymbalta.  -Her neuropathy has improved overall, she will try to reduce Cymbalta from 40 mg to 20 mg daily   5. Left UE DVT (08/04/18), On Eliquis 2.5mg  bid indefinitely   6. Anemia, Secondary to chemo -Continue monitoring, will consider blood transfusion if hemoglobin less than 8. -Hgb stable   7. Goal of Care Discussion, Social support -She lives alone, has her sister and niece in [place]. Her daughter lives in [place]. She is currently staying with her daughter and her family. -On Ativan PRN due to anxiety about diagnosis and treatment. She is working with our Education officer, museum to connect with a Social worker. -The patient understands the goal of care is palliative. -She is full code for now    8. Asthma  -secondary to post nasal drip and her Acid reflux, per Dr. Valeta Harms. Will continue medications. Her breathing has improved and adequate now.   -Continue to f/u with Dr Valeta Harms and Dr Collene Mares.       Plan: -proceed with C2 herceptin today -continue Tucatinib 300mg  bid, I refilled today -restart Xeloda  on 05/18/21 at 500 mg q12hr, and take for 7 days on/7 days off, I wrote down her her medication list for her  -lab, f/u, and next Herceptin in 3 weeks   No problem-specific Assessment & Plan notes found for this encounter.   SUMMARY OF ONCOLOGIC HISTORY: Oncology History Overview Note   Cancer Staging Metastatic breast cancer [facility]) Staging form: Breast, AJCC 8th Edition - Clinical stage from 03/24/2018: Stage IV (cT2, cN1, pM1, G3, ER+, PR+, HER2+) - Signed by Malachy Mood, MD on 03/30/2018     Metastatic breast cancer (HCC)  01/30/2018 Procedure   Colonoscopy showed small polyp in the sigmoid colon, removed, the exam of colon including the terminal ileum was otherwise negative.   01/30/2018 Procedure   EGD by Dr. Loreta Ave showed small hiatal hernia, a 8 mm polypoid lesion in the cardia, biopsied.   03/09/2018 Imaging   03/09/2018 US Abdomen IMPRESSION: 1. Mass lesions throughout the liver, consistent with metastatic disease. Liver as a somewhat nodular contour suggesting underlying hepatic cirrhosis. Inhomogeneous echotexture to the liver.   2. Cholelithiasis with mild gallbladder wall thickening. A degree of cholecystitis cannot be excluded by ultrasound.   3. Portions of pancreas obscured by gas. Visualized portions of pancreas appear normal.   4. Small right kidney. Etiology uncertain. This finding potentially may be indicative of renal artery stenosis. In this regard, question whether patient is hypertensive.   03/15/2018 Imaging   CT CAP with contrast  IMPRESSION: 1. Widespread hepatic metastasis. 2. 2.6 cm lateral right breast soft tissue nodule could represent a breast primary or an incidental benign lesion. Consider correlation with mammogram and ultrasound. 3. No definite source of primary malignancy identified within the abdomen or pelvis. There is possible rectosigmoid junction wall thickening. Consider colonoscopy with attention to this area. 4. Distal esophageal wall thickening, suggesting esophagitis.   03/24/2018 Cancer Staging   Staging form: Breast, AJCC 8th Edition - Clinical stage from 03/24/2018: Stage IV (cT2, cN1, pM1, G3, ER+, PR+, HER2+) - Signed by Malachy Mood, MD on 03/30/2018   03/24/2018 Initial Biopsy   Diagnosis 1. Breast, right, needle core biopsy,  11:30 o'clock, 2cm from nipple - INVASIVE DUCTAL CARCINOMA. - DUCTAL CARCINOMA IN SITU. -Grade 2  2. Breast, right, needle core biopsy, 9 o'clock, 7cm from nipple - INVASIVE DUCTAL CARCINOMA. -The carcinoma is somewhat morphologically dissimilar from that in part 1. It appears grade III 3. Lymph node, needle/core biopsy, right axillary - METASTATIC CARCINOMA IN 1 OF 1 LYMPH NODE (1/1).   03/24/2018 Receptors her2   Breast biopsy: 1. Estrogen Receptor: 40%, POSITIVE, STRONG-MODERATE STAINING INTENSITY Progesterone Receptor: 70%, POSITIVE, STRONG STAINING INTENSITY Proliferation Marker Ki67: 20% HER 2 equivocal by IHC 2+, POSITIVE by FISH, ratio 2.4 and copy #4.2  2. Estrogen Receptor: 60%, POSITIVE, MODERATE STAINING INTENSITY Progesterone Receptor: 40%, POSITIVE, MODERATE STAINING INTENSITY Proliferation Marker Ki67: 20% HER2 (+) by IHC 3+   03/24/2018 Initial Diagnosis   Metastatic breast cancer (HCC)   03/27/2018 Pathology Results   Diagnosis Liver, needle/core biopsy, Right - METASTATIC CARCINOMA TO LIVER, CONSISTENT WITH PATIENTS CLINICAL HISTORY OF PRIMARY BREAST CARCINOMA.  ER 80%+ PR40%+ HER2- (by [facility], IHC 2+)  Ki67 50%    03/28/2018 Pathology Results   03/28/2018 Surgical Pathology Diagnosis 1. Breast, left, needle core biopsy, 9 o'clock - FIBROCYSTIC CHANGES. - THERE IS NO EVIDENCE OF MALIGNANCY. 2. Breast, left, needle core biopsy, 2 o'clock - FIBROADENOMA. - THERE IS NO EVIDENCE OF MALIGNANCY. - SEE COMMENT.   03/29/2018 Imaging  03/29/2018 Bone Scan IMPRESSION: No scintigraphic evidence of osseous metastatic disease.   03/30/2018 Imaging   Bone scan  IMPRESSION: No scintigraphic evidence of osseous metastatic disease.    04/07/2018 - 07/30/2020 Chemotherapy   First line chemo weekly Taxol and herceptin/Perjeta every 3 weeks starting 04/07/18. She developed infusion reaction to taxol and it was discontinued. Added Abraxane on C1D8, 2 weeks on/1 week off.   Abraxne stopped after 10/13/18 due to worsening Neuropathy. She has continued with maintenance Herceptin injection/Perjeta q3weeks. Herceptin changed to injection on 04/06/19. Both injections switched to combination [place] on 10/01/19. ----Stopped 07/30/20 due to disease progression in liver.    06/06/2018 Imaging   CT CAP IMPRESSION: 1. Generally improved appearance, with reduced axillary adenopathy and reduced enhancing component of the hepatic masses, with some of the hepatic mass is moderately smaller than on the prior exam. Reduced size of the right lateral breast mass compared to the prior 03/15/2018 exam. 2. New mild interstitial accentuation in the lungs, significance uncertain. Part of this appearance may be due to lower lung volumes on today's exam. 3. Mild wall thickening in the descending colon and upper rectum suggesting low-grade colitis/inflammation. Prominent stool throughout the colon favors constipation. 4. Other imaging findings of potential clinical significance: Aortic Atherosclerosis (ICD10-I70.0). Mild cardiomegaly. Mild nodularity in the right lower lobe appears stable. Contracted and thick-walled gallbladder.     09/18/2018 Imaging   CT CAP W Contrast 09/18/18  IMPRESSION: 1. Liver metastases have decreased in size. 2. Right breast mass, mild right axillary lymphadenopathy and scattered tiny right pulmonary nodules are all stable. 3. New mild left supraclavicular and left subpectoral lymphadenopathy, can not exclude progression of metastatic nodal disease. 4. Moderate colorectal stool volume, which may indicate constipation. 5.  Aortic Atherosclerosis (ICD10-I70.0).   10/2018 - 07/30/2020 Anti-estrogen oral therapy   Letrozole 2.5 mg daily starting 10/2018 D/c to proceed with Inhertu treatment after liver met progression.    01/10/2019 Imaging   CT CAP W Contrast 01/10/19  IMPRESSION: 1. Continued improvement in the hepatic metastatic lesions which have reduced in  size. 2. Stable mild left supraclavicular and subpectoral adenopathy. 3. Essentially stable small right lower lobe pulmonary nodule and separate small subpleural nodule along the right hemidiaphragm. Surveillance suggested. 4. Other imaging findings of potential clinical significance: Mild cardiomegaly. Mild circumferential distal esophageal wall thickening, the most common cause would be esophagitis. Airway thickening is present, suggesting bronchitis or reactive airways disease. Airway plugging in the lower lobes and in the right middle lobe. Stable small right adrenal adenoma.   05/15/2019 Imaging   CT CAP W Contrast 05/15/19  IMPRESSION: CT CHEST IMPRESSION   1. Similar appearance of borderline supraclavicular, axillary, and subpectoral adenopathy. 2. Improved and resolved right lower lobe pulmonary nodularity. 3. Esophageal air fluid level suggests dysmotility or gastroesophageal reflux.   CT ABDOMEN AND PELVIS IMPRESSION   1. Improved hepatic metastasis. 2. Small bowel mesenteric lymph nodes which are upper normal and mildly enlarged. Likely increased and similar as detailed above. Indeterminate. Recommend attention on follow-up. 3. Cholelithiasis. 4. Motion degradation throughout the lower chest and abdomen.   09/19/2019 Imaging   CT CAP W contrast  IMPRESSION: 1. Interval decrease in size of right axillary and subpectoral lymph nodes. There are no pathologically enlarged lymph nodes remaining in the chest, abdomen, or pelvis. No new lymphadenopathy. 2. No significant change in post treatment appearance of multiple low-attenuation liver lesions, in keeping with treated metastases. 3. No evidence of new metastatic disease in the chest,  abdomen, or pelvis. 4. Aortic Atherosclerosis (ICD10-I70.0).   11/16/2019 Imaging   IMRI Brain  MPRESSION: Minimal chronic microvascular ischemic changes. No acute intracranial process.   Active bilateral maxillary sinus disease  with mild frontoethmoid mucosal thickening.   01/23/2020 Imaging   CT CAP W contrast  IMPRESSION: 1. Stable exam. No new or progressive interval findings. 2. Stable appearance of upper normal right axillary lymph nodes. Tiny subpectoral and left axillary nodes are unchanged. 3. Generally similar appearance of ill-defined hypoattenuating lesions in the liver compatible with treated metastases. 1 lesion in the central liver is minimally more conspicuous today, likely related to bolus timing and attention on follow-up recommended. No new suspicious liver lesion on today's study. 4. Stable 10 mm right adrenal nodule., indeterminate. Continued attention on follow-up imaging recommended. 5. Cholelithiasis. 6. Aortic Atherosclerosis (ICD10-I70.0).   02/11/2020 Breast MRI   IMPRESSION: 1. 7 millimeter focus of residual enhancement associated with the known malignancy in the 9 o'clock location of the RIGHT breast. 2. No significant enhancement in the known malignancy in the 11:30 o'clock location of the RIGHT breast. 3. Interval resolution of axillary adenopathy.   07/28/2020 Imaging   CT CAP  IMPRESSION: 1. Interval progression of hepatic metastases. 2. Stable indeterminate right adrenal nodule. 3. No new sites of metastatic disease in the chest, abdomen or pelvis. 4. Chronic findings include: Small hiatal hernia. Cholelithiasis. Aortic Atherosclerosis (ICD10-I70.0).     08/08/2020 -  Chemotherapy   Second-line Inhertu q3weeks starting 08/08/20    01/06/2021 Imaging   MRI Brain  IMPRESSION: No evidence of acute intracranial abnormality.   No evidence of intracranial metastatic disease.   Stable noncontrast MRI appearance of the brain as compared to 11/16/2019.   Mild chronic small vessel ischemic changes within the cerebral white matter.   Redemonstrated tiny chronic lacunar infarct within the right cerebellar hemisphere.   Mild paranasal sinus disease, as described    03/24/2021 Imaging   CT CAP  IMPRESSION: 1. Increase in size and visible number of hepatic metastatic lesions compatible with progressive malignancy. 2. Other imaging findings of potential clinical significance: Aortic Atherosclerosis (ICD10-I70.0). Small type 1 hiatal hernia. Prominent stool throughout the colon favors constipation. Trace free pelvic fluid, etiology uncertain. Small right adrenal adenoma.   04/10/2021 -  Chemotherapy   Patient is on Treatment Plan : BREAST Capecitabine + Trastuzumab + Tucatinib q21d        INTERVAL HISTORY:  Teresa Hays is here for a follow up of metastatic breast cancer. She was last seen by me on 04/11/21 with phone visit in the interim. She presents to the clinic alone. She denies any further foggy-feeling or lightheadedness. She has lost some weight since her last visit, but she notes she is still eating well and supplementing with Ensure. She notes she has not been back to her daughter's house recently. She notes she will be here this month but will likely spend the Thanksgiving holiday there. She also notes a sister will be coming to stay with her for a few months starting next month. She notes her niece checks in on her often.   All other systems were reviewed with the patient and are negative.  MEDICAL HISTORY:  Past Medical History:  Diagnosis Date   GERD (gastroesophageal reflux disease)    Hypertension    Personal history of chemotherapy    rt breast ca with mets to liver dx'd 03/2018    SURGICAL HISTORY: Past Surgical History:  Procedure Laterality Date   BREAST  BIOPSY Right 03/24/2018   CA x3   BREAST BIOPSY Left 03/28/2018   neg   BREAST BIOPSY Left 03/30/2018   neg   COLONOSCOPY     ESOPHAGOGASTRODUODENOSCOPY ENDOSCOPY     IR IMAGING GUIDED PORT INSERTION  04/04/2018    I have reviewed the social history and family history with the patient and they are unchanged from previous note.  ALLERGIES:  has No Known  Allergies.  MEDICATIONS:  Current Outpatient Medications  Medication Sig Dispense Refill   amLODipine (NORVASC) 5 MG tablet TAKE 1 TABLET BY MOUTH ONCE DAILY 30 tablet 2   apixaban (ELIQUIS) 2.5 MG TABS tablet Take 1 tablet (2.5 mg total) by mouth 2 (two) times daily. 60 tablet 1   Budeson-Glycopyrrol-Formoterol 160-9-4.8 MCG/ACT AERO INHALE 2 PUFFS BY MOUTH INTO THE LUNGS IN THE MORNING AND AT BEDTIME. 10.7 g 6   capecitabine (XELODA) 500 MG tablet Take 2 tabs in morning and 3 tabs in evening, 30 mins after meal. Take for 14 days then off for 7 days. 70 tablet 0   carvedilol (COREG) 3.125 MG tablet TAKE 1 TABLET BY MOUTH TWICE DAILY. REPLACES ATENOLOL 180 tablet 3   diphenoxylate-atropine (LOMOTIL) 2.5-0.025 MG tablet Take 1-2 tablets by mouth 4 (four) times daily as needed for diarrhea or loose stools. 30 tablet 0   DULoxetine (CYMBALTA) 20 MG capsule TAKE 2 CAPSULES BY MOUTH DAILY 60 capsule 3   gabapentin (NEURONTIN) 300 MG capsule MAY TAKE UP TO 3 CAPSULES BY MOUTH 3 TIMES DAILY 270 capsule 3   lidocaine (XYLOCAINE) 2 % jelly APPLY OVER PORT A CATH TOPICALLY 1 HOUR BEFORE PORT BEING ACCESSED AS NEEDED 30 mL 2   LORazepam (ATIVAN) 0.5 MG tablet Take 1 tablet (0.5 mg total) by mouth every 8 (eight) hours as needed for anxiety. 30 tablet 0   losartan (COZAAR) 50 MG tablet Take 1 tablet (50 mg total) by mouth daily. 180 tablet 3   meclizine (ANTIVERT) 25 MG tablet TAKE 1/2 TABLET BY MOUTH 3 TIMES DAILY AS NEEDED FOR DIZZINESS 15 tablet 2   montelukast (SINGULAIR) 10 MG tablet TAKE 1 TABLET (10 MG TOTAL) BY MOUTH AT BEDTIME. 30 tablet 5   ondansetron (ZOFRAN) 8 MG tablet TAKE 1 TABLET BY MOUTH EVERY 8 HOURS AS NEEDED FOR NAUSEA OR VOMITING 30 tablet 3   potassium chloride SA (KLOR-CON) 20 MEQ tablet TAKE 1 TABLET BY MOUTH ONCE A DAY 30 tablet 3   prochlorperazine (COMPAZINE) 10 MG tablet TAKE 1 TABLET BY MOUTH EVERY 6 HOURS AS NEEDED FOR NAUSEA OR VOMITING 30 tablet 3   spironolactone  (ALDACTONE) 25 MG tablet Take 0.5 tablets (12.5 mg total) by mouth daily. 90 tablet 3   traMADol (ULTRAM) 50 MG tablet Take 1 tablet (50 mg total) by mouth every 6 (six) hours as needed for moderate pain or severe pain. 30 tablet 0   tucatinib (TUKYSA) 150 MG tablet Take 2 tablets (300 mg total) by mouth 2 (two) times daily. Take every 12 hrs at the same time each day with or without a meal. Take as directed by MD. 120 tablet 2   Zoster Vaccine Adjuvanted (SHINGRIX) injection INJECT 0.5 MLS INTO THE MUSCLE ONCE FOR 1 DOSE. (Patient not taking: Reported on 04/10/2021) 1 each 1   No current facility-administered medications for this visit.   Facility-Administered Medications Ordered in Other Visits  Medication Dose Route Frequency Provider Last Rate Last Admin   sodium chloride flush (NS) 0.9 % injection 10 mL  10 mL Intracatheter PRN [name], MD   10 mL at 05/28/20 0935    PHYSICAL EXAMINATION: ECOG PERFORMANCE STATUS: 1 - Symptomatic but completely ambulatory  Vitals:   05/01/21 0850  BP: 120/79  Pulse: 68  Resp: 18  Temp: 97.7 F (36.5 C)  SpO2: 100%   Wt Readings from Last 3 Encounters:  05/01/21 145 lb 11.2 oz (66.1 kg)  04/10/21 144 lb 9.6 oz (65.6 kg)  04/10/21 149 lb 8 oz (67.8 kg)     GENERAL:alert, no distress and comfortable SKIN: skin color normal, no rashes or significant lesions EYES: normal, Conjunctiva are pink and non-injected, sclera clear  NEURO: alert & oriented x 3 with fluent speech  LABORATORY DATA:  I have reviewed the data as listed CBC Latest Ref Rng & Units 05/01/2021 04/10/2021 03/27/2021  WBC 4.0 - 10.5 K/uL 4.5 4.9 4.5  Hemoglobin 12.0 - 15.0 g/dL 10.6(L) 10.8(L) 9.5(L)  Hematocrit 36.0 - 46.0 % 30.9(L) 32.4(L) 29.2(L)  Platelets 150 - 400 K/uL 137(L) 162 235     CMP Latest Ref Rng & Units 04/10/2021 03/27/2021 03/06/2021  Glucose 70 - 99 mg/dL 101(H) 116(H) 123(H)  BUN 8 - 23 mg/dL 25(H) 14 15  Creatinine 0.44 - 1.00 mg/dL 1.23(H) 1.53(H) 1.57(H)   Sodium 135 - 145 mmol/L 131(L) 135 136  Potassium 3.5 - 5.1 mmol/L 4.8 4.4 4.4  Chloride 98 - 111 mmol/L 102 104 103  CO2 22 - 32 mmol/L 20(L) 23 22  Calcium 8.9 - 10.3 mg/dL 10.3 9.9 9.9  Total Protein 6.5 - 8.1 g/dL 7.6 6.7 6.9  Total Bilirubin 0.3 - 1.2 mg/dL 1.2 0.5 0.5  Alkaline Phos 38 - 126 U/L 451(H) 372(H) 276(H)  AST 15 - 41 U/L 79(H) 53(H) 49(H)  ALT 0 - 44 U/L 49(H) 32 26      RADIOGRAPHIC STUDIES: I have personally reviewed the radiological images as listed and agreed with the findings in the report. No results found.    No orders of the defined types were placed in this encounter.  All questions were answered. The patient knows to call the clinic with any problems, questions or concerns. No barriers to learning was detected. The total time spent in the appointment was 30 minutes.     [name], MD 05/01/2021   I, Wilburn Mylar, am acting as scribe for [name], MD.   I have reviewed the above documentation for accuracy and completeness, and I agree with the above.

## 2021-05-01 NOTE — Progress Notes (Deleted)
2 RNs Verified : Drug appearance and integrity are intact; Drug expiration date and time- drug has not expired; Pump settings are accurate, if applicable  

## 2021-05-02 LAB — CANCER ANTIGEN 27.29: CA 27.29: 400.3 U/mL — ABNORMAL HIGH (ref 0.0–38.6)

## 2021-05-04 ENCOUNTER — Telehealth: Payer: Self-pay | Admitting: Hematology

## 2021-05-04 ENCOUNTER — Other Ambulatory Visit (HOSPITAL_COMMUNITY): Payer: Self-pay

## 2021-05-04 NOTE — Telephone Encounter (Signed)
Scheduled follow-up appointments per 10/14 los. Patient is aware. 

## 2021-05-11 ENCOUNTER — Other Ambulatory Visit (HOSPITAL_COMMUNITY): Payer: Self-pay

## 2021-05-11 MED FILL — Zoster Vac Recombinant Adjuvanted for IM Inj 50 MCG/0.5ML: INTRAMUSCULAR | 1 days supply | Qty: 1 | Fill #0 | Status: CN

## 2021-05-18 ENCOUNTER — Other Ambulatory Visit (HOSPITAL_COMMUNITY): Payer: Self-pay

## 2021-05-20 ENCOUNTER — Other Ambulatory Visit (HOSPITAL_COMMUNITY): Payer: Self-pay

## 2021-05-22 ENCOUNTER — Inpatient Hospital Stay: Payer: Medicare Other | Attending: Hematology

## 2021-05-22 ENCOUNTER — Other Ambulatory Visit: Payer: Self-pay

## 2021-05-22 ENCOUNTER — Inpatient Hospital Stay (HOSPITAL_BASED_OUTPATIENT_CLINIC_OR_DEPARTMENT_OTHER): Payer: Medicare Other | Admitting: Hematology

## 2021-05-22 ENCOUNTER — Inpatient Hospital Stay: Payer: Medicare Other

## 2021-05-22 VITALS — BP 119/83 | HR 49 | Temp 98.0°F | Resp 14 | Wt 140.0 lb

## 2021-05-22 DIAGNOSIS — C50919 Malignant neoplasm of unspecified site of unspecified female breast: Secondary | ICD-10-CM

## 2021-05-22 DIAGNOSIS — R531 Weakness: Secondary | ICD-10-CM | POA: Insufficient documentation

## 2021-05-22 DIAGNOSIS — Z1501 Genetic susceptibility to malignant neoplasm of breast: Secondary | ICD-10-CM

## 2021-05-22 DIAGNOSIS — Z170 Estrogen receptor positive status [ER+]: Secondary | ICD-10-CM | POA: Insufficient documentation

## 2021-05-22 DIAGNOSIS — Z452 Encounter for adjustment and management of vascular access device: Secondary | ICD-10-CM | POA: Insufficient documentation

## 2021-05-22 DIAGNOSIS — K219 Gastro-esophageal reflux disease without esophagitis: Secondary | ICD-10-CM | POA: Diagnosis present

## 2021-05-22 DIAGNOSIS — T451X5A Adverse effect of antineoplastic and immunosuppressive drugs, initial encounter: Secondary | ICD-10-CM | POA: Diagnosis not present

## 2021-05-22 DIAGNOSIS — C773 Secondary and unspecified malignant neoplasm of axilla and upper limb lymph nodes: Secondary | ICD-10-CM | POA: Diagnosis not present

## 2021-05-22 DIAGNOSIS — Z7901 Long term (current) use of anticoagulants: Secondary | ICD-10-CM | POA: Diagnosis not present

## 2021-05-22 DIAGNOSIS — Z9181 History of falling: Secondary | ICD-10-CM

## 2021-05-22 DIAGNOSIS — Z86718 Personal history of other venous thrombosis and embolism: Secondary | ICD-10-CM | POA: Insufficient documentation

## 2021-05-22 DIAGNOSIS — C50811 Malignant neoplasm of overlapping sites of right female breast: Secondary | ICD-10-CM | POA: Diagnosis present

## 2021-05-22 DIAGNOSIS — D638 Anemia in other chronic diseases classified elsewhere: Secondary | ICD-10-CM | POA: Diagnosis present

## 2021-05-22 DIAGNOSIS — Z95828 Presence of other vascular implants and grafts: Secondary | ICD-10-CM

## 2021-05-22 DIAGNOSIS — Z8261 Family history of arthritis: Secondary | ICD-10-CM

## 2021-05-22 DIAGNOSIS — E860 Dehydration: Secondary | ICD-10-CM | POA: Diagnosis not present

## 2021-05-22 DIAGNOSIS — R110 Nausea: Secondary | ICD-10-CM | POA: Insufficient documentation

## 2021-05-22 DIAGNOSIS — D3501 Benign neoplasm of right adrenal gland: Secondary | ICD-10-CM | POA: Insufficient documentation

## 2021-05-22 DIAGNOSIS — E876 Hypokalemia: Secondary | ICD-10-CM | POA: Diagnosis present

## 2021-05-22 DIAGNOSIS — R600 Localized edema: Secondary | ICD-10-CM | POA: Diagnosis not present

## 2021-05-22 DIAGNOSIS — Z20822 Contact with and (suspected) exposure to covid-19: Secondary | ICD-10-CM | POA: Diagnosis present

## 2021-05-22 DIAGNOSIS — F419 Anxiety disorder, unspecified: Secondary | ICD-10-CM | POA: Diagnosis not present

## 2021-05-22 DIAGNOSIS — Z9221 Personal history of antineoplastic chemotherapy: Secondary | ICD-10-CM | POA: Insufficient documentation

## 2021-05-22 DIAGNOSIS — Z79899 Other long term (current) drug therapy: Secondary | ICD-10-CM | POA: Insufficient documentation

## 2021-05-22 DIAGNOSIS — Z833 Family history of diabetes mellitus: Secondary | ICD-10-CM

## 2021-05-22 DIAGNOSIS — C787 Secondary malignant neoplasm of liver and intrahepatic bile duct: Secondary | ICD-10-CM | POA: Diagnosis not present

## 2021-05-22 DIAGNOSIS — Z79811 Long term (current) use of aromatase inhibitors: Secondary | ICD-10-CM | POA: Diagnosis not present

## 2021-05-22 DIAGNOSIS — K449 Diaphragmatic hernia without obstruction or gangrene: Secondary | ICD-10-CM | POA: Insufficient documentation

## 2021-05-22 DIAGNOSIS — E871 Hypo-osmolality and hyponatremia: Secondary | ICD-10-CM | POA: Diagnosis present

## 2021-05-22 DIAGNOSIS — E7220 Disorder of urea cycle metabolism, unspecified: Secondary | ICD-10-CM | POA: Diagnosis present

## 2021-05-22 DIAGNOSIS — J45909 Unspecified asthma, uncomplicated: Secondary | ICD-10-CM | POA: Diagnosis not present

## 2021-05-22 DIAGNOSIS — G9341 Metabolic encephalopathy: Secondary | ICD-10-CM | POA: Insufficient documentation

## 2021-05-22 DIAGNOSIS — D6481 Anemia due to antineoplastic chemotherapy: Secondary | ICD-10-CM | POA: Diagnosis not present

## 2021-05-22 DIAGNOSIS — Z853 Personal history of malignant neoplasm of breast: Secondary | ICD-10-CM

## 2021-05-22 DIAGNOSIS — Z5112 Encounter for antineoplastic immunotherapy: Secondary | ICD-10-CM | POA: Diagnosis not present

## 2021-05-22 DIAGNOSIS — I700 Atherosclerosis of aorta: Secondary | ICD-10-CM | POA: Diagnosis not present

## 2021-05-22 DIAGNOSIS — E8809 Other disorders of plasma-protein metabolism, not elsewhere classified: Secondary | ICD-10-CM | POA: Diagnosis present

## 2021-05-22 DIAGNOSIS — N182 Chronic kidney disease, stage 2 (mild): Secondary | ICD-10-CM | POA: Diagnosis present

## 2021-05-22 DIAGNOSIS — G620 Drug-induced polyneuropathy: Secondary | ICD-10-CM | POA: Diagnosis not present

## 2021-05-22 DIAGNOSIS — K2100 Gastro-esophageal reflux disease with esophagitis, without bleeding: Secondary | ICD-10-CM | POA: Diagnosis not present

## 2021-05-22 DIAGNOSIS — Z803 Family history of malignant neoplasm of breast: Secondary | ICD-10-CM

## 2021-05-22 DIAGNOSIS — E875 Hyperkalemia: Secondary | ICD-10-CM | POA: Diagnosis present

## 2021-05-22 DIAGNOSIS — C50911 Malignant neoplasm of unspecified site of right female breast: Secondary | ICD-10-CM | POA: Diagnosis present

## 2021-05-22 DIAGNOSIS — N179 Acute kidney failure, unspecified: Principal | ICD-10-CM | POA: Diagnosis present

## 2021-05-22 DIAGNOSIS — I959 Hypotension, unspecified: Secondary | ICD-10-CM | POA: Diagnosis present

## 2021-05-22 DIAGNOSIS — I429 Cardiomyopathy, unspecified: Secondary | ICD-10-CM | POA: Diagnosis present

## 2021-05-22 DIAGNOSIS — Z66 Do not resuscitate: Secondary | ICD-10-CM | POA: Diagnosis present

## 2021-05-22 DIAGNOSIS — E43 Unspecified severe protein-calorie malnutrition: Secondary | ICD-10-CM | POA: Diagnosis present

## 2021-05-22 DIAGNOSIS — Z7951 Long term (current) use of inhaled steroids: Secondary | ICD-10-CM

## 2021-05-22 DIAGNOSIS — I129 Hypertensive chronic kidney disease with stage 1 through stage 4 chronic kidney disease, or unspecified chronic kidney disease: Secondary | ICD-10-CM | POA: Diagnosis present

## 2021-05-22 DIAGNOSIS — E872 Acidosis, unspecified: Secondary | ICD-10-CM | POA: Diagnosis not present

## 2021-05-22 DIAGNOSIS — R627 Adult failure to thrive: Secondary | ICD-10-CM | POA: Diagnosis present

## 2021-05-22 DIAGNOSIS — I82622 Acute embolism and thrombosis of deep veins of left upper extremity: Secondary | ICD-10-CM | POA: Diagnosis present

## 2021-05-22 LAB — CBC WITH DIFFERENTIAL (CANCER CENTER ONLY)
Abs Immature Granulocytes: 0.02 10*3/uL (ref 0.00–0.07)
Basophils Absolute: 0 10*3/uL (ref 0.0–0.1)
Basophils Relative: 1 %
Eosinophils Absolute: 0.2 10*3/uL (ref 0.0–0.5)
Eosinophils Relative: 3 %
HCT: 34.1 % — ABNORMAL LOW (ref 36.0–46.0)
Hemoglobin: 11.8 g/dL — ABNORMAL LOW (ref 12.0–15.0)
Immature Granulocytes: 0 %
Lymphocytes Relative: 15 %
Lymphs Abs: 0.9 10*3/uL (ref 0.7–4.0)
MCH: 28.9 pg (ref 26.0–34.0)
MCHC: 34.6 g/dL (ref 30.0–36.0)
MCV: 83.6 fL (ref 80.0–100.0)
Monocytes Absolute: 0.4 10*3/uL (ref 0.1–1.0)
Monocytes Relative: 7 %
Neutro Abs: 4.4 10*3/uL (ref 1.7–7.7)
Neutrophils Relative %: 74 %
Platelet Count: 209 10*3/uL (ref 150–400)
RBC: 4.08 MIL/uL (ref 3.87–5.11)
RDW: 16.7 % — ABNORMAL HIGH (ref 11.5–15.5)
WBC Count: 5.8 10*3/uL (ref 4.0–10.5)
nRBC: 0 % (ref 0.0–0.2)

## 2021-05-22 LAB — CMP (CANCER CENTER ONLY)
ALT: 86 U/L — ABNORMAL HIGH (ref 0–44)
AST: 159 U/L — ABNORMAL HIGH (ref 15–41)
Albumin: 3.6 g/dL (ref 3.5–5.0)
Alkaline Phosphatase: 361 U/L — ABNORMAL HIGH (ref 38–126)
Anion gap: 11 (ref 5–15)
BUN: 19 mg/dL (ref 8–23)
CO2: 21 mmol/L — ABNORMAL LOW (ref 22–32)
Calcium: 10 mg/dL (ref 8.9–10.3)
Chloride: 98 mmol/L (ref 98–111)
Creatinine: 1.67 mg/dL — ABNORMAL HIGH (ref 0.44–1.00)
GFR, Estimated: 34 mL/min — ABNORMAL LOW (ref >60)
Glucose, Bld: 129 mg/dL — ABNORMAL HIGH (ref 70–99)
Potassium: 4.9 mmol/L (ref 3.5–5.1)
Sodium: 130 mmol/L — ABNORMAL LOW (ref 135–145)
Total Bilirubin: 2.5 mg/dL — ABNORMAL HIGH (ref 0.3–1.2)
Total Protein: 7.4 g/dL (ref 6.5–8.1)

## 2021-05-22 MED ORDER — SODIUM CHLORIDE 0.9% FLUSH
10.0000 mL | INTRAVENOUS | Status: DC | PRN
  Administered 2021-05-22: 10 mL

## 2021-05-22 MED ORDER — SODIUM CHLORIDE 0.9 % IV SOLN: Freq: Once | INTRAVENOUS | Status: AC

## 2021-05-22 NOTE — Progress Notes (Signed)
Teresa Hays   [phone] [phone]   Clinic Follow up Note   Patient Care Team: [name], DO as PCP - General (Family Medicine) [name], MD as Consulting Physician (Gastroenterology) [name], MD as Consulting Physician (Hematology)  Date of Service:  05/22/2021  CHIEF COMPLAINT: f/u of metastatic breast cancer  CURRENT THERAPY:  -Tucatinib and Xeloda started 04/04/21, held due to side effects 04/10/21, restarted Tucatinib on 04/13/21 and low dose Xeloda 05/18/21, held again on 05/23/2021 -Herceptin restarted 04/10/21  ASSESSMENT & PLAN:  Teresa Hays is a 65 y.o. female with   1. Jaundice, Weakness, Nausea -she complained of nausea and weakness tfor past 3-5 days. She notes she has not eaten today. Physical exam showed generalized muscular weakness -last CT CAP 03/24/21 and last brain MRI w/ contrast 01/06/21 and w/o contrast 01/29/21 [facility]) -labs today reviewed-- AST 159, ALT 86, total bilirubin 2.5, sodium 130 -we will hold treatment today and proceed with IVF only.  2. 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 She presented with diffuse liver metastasis, two right breast masses and right axillary adenopathy. Breast mass biopsy showed ER PR positive, but 1 was HER-2 positive, the other one was HER-2 negative. Liver met was HER2-. -She was treated with letrozole, trastuzumab and Perjeta, until disease progression on 07/28/20 CT CAP. -She switched to Enhertu q3weeks starting 08/08/20. Given poor toleration with C1 (nausea, diarrhea, poor appetite), dose reduced with C2 and she tolerated much better.  -restaging CT CAP on 03/24/21 showed progressive hepatic metastasis. -We changed her treatment to oral Xeloda, tucatinib, and IV trastuzumab. -Given her CKD with KCM03, I will reduce Xeloda dose by 25%, she will take 1000 mg in the morning, 1500 mg in the  evening, day 1-14 every 21 days -She began tucatinib and Xeloda on 04/04/21 and developed dizziness and nausea. I held both for the weekend. She restarted her tucatinib on Monday, 04/13/21, at half-dose and increased to full dose 04/20/21.  -she restarted Xeloda at low-dose (500 mg q12hr) on 05/18/21. She again developed nausea and weakness. We will discontinue the medicine. -Labs reviewed, we will hold chemo today.   2. Syncope, HTN, Headaches -During week 1 of C2 Enhertu she notes episode of syncope.  -She had another episode about a week after C9. Head CT and MRI w/o contrast on 01/29/21 were negative. -intermittent left-sided headaches, Brain MRI 01/06/21 was negative for metastatic disease or acute intracranial abnormality. -she experienced dizziness and nausea after starting tucatinib and Xeloda -per pt's sister and niece, she is having brain fog, general weakness, and stiffness.   3. Peripheral neuropathy, grade 2, Right Foot pain  -Secondary to chemotherapy -I previously stropped Abraxane on 10/13/18.  -She continues on Gabapentin and Cymbalta.  -Her neuropathy has improved overall, she will try to reduce Cymbalta from 40 mg to 20 mg daily   5. 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 Anemia, Secondary to chemo -Continue monitoring, will consider blood transfusion if hemoglobin less than 8. -improving, up to 11.8 today (05/22/21)   7. Goal of Care Discussion, Social support -She lives alone, has her sister and niece in [place]. Her daughter lives in [place]. She is currently staying with her daughter and her family. -On Ativan PRN due to anxiety about diagnosis and treatment. She is working with our Education officer, museum to connect with a Social worker. -The patient understands the goal of care  is palliative. -She is full code for now    8. Asthma  -secondary to post nasal drip and her Acid reflux, per Dr. Valeta Harms. Will continue medications. Her breathing has improved and  adequate now.   -Continue to f/u with Dr Valeta Harms and Dr Collene Mares.       Plan: -proceed with IVF only today -hold herceptin, tucatinib and Xeloda -lab, f/u with [facility] and IVF on Monday, 05/25/21. If she has persistent symptoms and hyperbilirubinemia, will order restaging scan and possible brain MRI  -I spoke with her sister and niece today   No problem-specific Assessment & Plan notes found for this encounter.   SUMMARY OF ONCOLOGIC HISTORY: Oncology History Overview Note  Cancer Staging Metastatic breast cancer [facility]) Staging form: Breast, AJCC 8th Edition - Clinical stage from 03/24/2018: Stage IV (cT2, cN1, pM1, G3, ER+, PR+, HER2+) - Signed by [name], MD on 03/30/2018     Metastatic breast cancer ([place])  01/30/2018 Procedure   Colonoscopy showed small polyp in the sigmoid colon, removed, the exam of colon including the terminal ileum was otherwise negative.   01/30/2018 Procedure   EGD by Dr. Collene Mares showed small hiatal hernia, a 8 mm polypoid lesion in the cardia, biopsied.   03/09/2018 Imaging   03/09/2018 US Abdomen IMPRESSION: 1. Mass lesions throughout the liver, consistent with metastatic disease. Liver as a somewhat nodular contour suggesting underlying hepatic cirrhosis. Inhomogeneous echotexture to the liver.   2. Cholelithiasis with mild gallbladder wall thickening. A degree of cholecystitis cannot be excluded by ultrasound.   3. Portions of pancreas obscured by gas. Visualized portions of pancreas appear normal.   4. Small right kidney. Etiology uncertain. This finding potentially may be indicative of renal artery stenosis. In this regard, question whether patient is hypertensive.   03/15/2018 Imaging   CT CAP with contrast  IMPRESSION: 1. Widespread hepatic metastasis. 2. 2.6 cm lateral right breast soft tissue nodule could represent a breast primary or an incidental benign lesion. Consider correlation with mammogram and ultrasound. 3. No definite source of primary  malignancy identified within the abdomen or pelvis. There is possible rectosigmoid junction wall thickening. Consider colonoscopy with attention to this area. 4. Distal esophageal wall thickening, suggesting esophagitis.   03/24/2018 Cancer Staging   Staging form: Breast, AJCC 8th Edition - Clinical stage from 03/24/2018: Stage IV (cT2, cN1, pM1, G3, ER+, PR+, HER2+) - Signed by [name], MD on 03/30/2018    03/24/2018 Initial Biopsy   Diagnosis 1. 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 Breast, right, needle core biopsy, 9 o'clock, 7cm from nipple - INVASIVE DUCTAL CARCINOMA. -The carcinoma is somewhat morphologically dissimilar from that in part 1. It appears grade III 3. Lymph node, needle/core biopsy, right axillary - METASTATIC CARCINOMA IN 1 OF 1 LYMPH NODE (1/1).   03/24/2018 Receptors her2   Breast biopsy: 1. Estrogen Receptor: 40%, POSITIVE, STRONG-MODERATE STAINING INTENSITY Progesterone Receptor: 70%, POSITIVE, STRONG STAINING INTENSITY Proliferation Marker Ki67: 20% HER 2 equivocal by IHC 2+, POSITIVE by FISH, ratio 2.4 and copy #4.2  2. Estrogen Receptor: 60%, POSITIVE, MODERATE STAINING INTENSITY Progesterone Receptor: 40%, POSITIVE, MODERATE STAINING INTENSITY Proliferation Marker Ki67: 20% HER2 (+) by IHC 3+   03/24/2018 Initial Diagnosis   Metastatic breast cancer ([place])   03/27/2018 Pathology Results   Diagnosis Liver, needle/core biopsy, Right - METASTATIC CARCINOMA TO LIVER, CONSISTENT WITH PATIENTS CLINICAL HISTORY OF PRIMARY BREAST CARCINOMA.  ER 80%+ PR40%+ HER2- (by [facility], IHC 2+)  Ki67 50%    03/28/2018 Pathology Results   03/28/2018 Surgical Pathology Diagnosis 1. Breast, left, needle core biopsy, 9 o'clock - FIBROCYSTIC CHANGES. - THERE IS NO EVIDENCE OF MALIGNANCY. 2. Breast, left, needle core biopsy, 2 o'clock - FIBROADENOMA. - THERE IS NO EVIDENCE OF  MALIGNANCY. - SEE COMMENT.   03/29/2018 Imaging   03/29/2018 Bone Scan IMPRESSION: No scintigraphic evidence of osseous metastatic disease.   03/30/2018 Imaging   Bone scan  IMPRESSION: No scintigraphic evidence of osseous metastatic disease.    04/07/2018 - 07/30/2020 Chemotherapy   First line chemo weekly Taxol and herceptin/Perjeta every 3 weeks starting 04/07/18. She developed infusion reaction to taxol and it was discontinued. Added Abraxane on C1D8, 2 weeks on/1 week off.  Abraxne stopped after 10/13/18 due to worsening Neuropathy. She has continued with maintenance Herceptin injection/Perjeta q3weeks. Herceptin changed to injection on 04/06/19. Both injections switched to combination [place] on 10/01/19. ----Stopped 07/30/20 due to disease progression in liver.    06/06/2018 Imaging   CT CAP IMPRESSION: 1. Generally improved appearance, with reduced axillary adenopathy and reduced enhancing component of the hepatic masses, with some of the hepatic mass is moderately smaller than on the prior exam. Reduced size of the right lateral breast mass compared to the prior 03/15/2018 exam. 2. New mild interstitial accentuation in the lungs, significance uncertain. Part of this appearance may be due to lower lung volumes on today's exam. 3. Mild wall thickening in the descending colon and upper rectum suggesting low-grade colitis/inflammation. Prominent stool throughout the colon favors constipation. 4. Other imaging findings of potential clinical significance: Aortic Atherosclerosis (ICD10-I70.0). Mild cardiomegaly. Mild nodularity in the right lower lobe appears stable. Contracted and thick-walled gallbladder.     09/18/2018 Imaging   CT CAP W Contrast 09/18/18  IMPRESSION: 1. Liver metastases have decreased in size. 2. Right breast mass, mild right axillary lymphadenopathy and scattered tiny right pulmonary nodules are all stable. 3. New mild left supraclavicular and left  subpectoral lymphadenopathy, can not exclude progression of metastatic nodal disease. 4. Moderate colorectal stool volume, which may indicate constipation. 5.  Aortic Atherosclerosis (ICD10-I70.0).   10/2018 - 07/30/2020 Anti-estrogen oral therapy   Letrozole 2.5 mg daily starting 10/2018 D/c to proceed with Inhertu treatment after liver met progression.    01/10/2019 Imaging   CT CAP W Contrast 01/10/19  IMPRESSION: 1. Continued improvement in the hepatic metastatic lesions which have reduced in size. 2. Stable mild left supraclavicular and subpectoral adenopathy. 3. Essentially stable small right lower lobe pulmonary nodule and separate small subpleural nodule along the right hemidiaphragm. Surveillance suggested. 4. Other imaging findings of potential clinical significance: Mild cardiomegaly. Mild circumferential distal esophageal wall thickening, the most common cause would be esophagitis. Airway thickening is present, suggesting bronchitis or reactive airways disease. Airway plugging in the lower lobes and in the right middle lobe. Stable small right adrenal adenoma.   05/15/2019 Imaging   CT CAP W Contrast 05/15/19  IMPRESSION: CT CHEST IMPRESSION   1. Similar appearance of borderline supraclavicular, axillary, and subpectoral adenopathy. 2. Improved and resolved right lower lobe pulmonary nodularity. 3. Esophageal air fluid level suggests dysmotility or gastroesophageal reflux.   CT ABDOMEN AND PELVIS IMPRESSION   1. Improved hepatic metastasis. 2. Small bowel mesenteric lymph nodes which are upper normal and mildly enlarged. Likely increased and similar as detailed above. Indeterminate. Recommend attention on follow-up. 3. Cholelithiasis. 4. Motion degradation throughout the lower chest and abdomen.  09/19/2019 Imaging   CT CAP W contrast  IMPRESSION: 1. Interval decrease in size of right axillary and subpectoral lymph nodes. There are no pathologically enlarged  lymph nodes remaining in the chest, abdomen, or pelvis. No new lymphadenopathy. 2. No significant change in post treatment appearance of multiple low-attenuation liver lesions, in keeping with treated metastases. 3. No evidence of new metastatic disease in the chest, abdomen, or pelvis. 4. Aortic Atherosclerosis (ICD10-I70.0).   11/16/2019 Imaging   IMRI Brain  MPRESSION: Minimal chronic microvascular ischemic changes. No acute intracranial process.   Active bilateral maxillary sinus disease with mild frontoethmoid mucosal thickening.   01/23/2020 Imaging   CT CAP W contrast  IMPRESSION: 1. Stable exam. No new or progressive interval findings. 2. Stable appearance of upper normal right axillary lymph nodes. Tiny subpectoral and left axillary nodes are unchanged. 3. Generally similar appearance of ill-defined hypoattenuating lesions in the liver compatible with treated metastases. 1 lesion in the central liver is minimally more conspicuous today, likely related to bolus timing and attention on follow-up recommended. No new suspicious liver lesion on today's study. 4. Stable 10 mm right adrenal nodule., indeterminate. Continued attention on follow-up imaging recommended. 5. Cholelithiasis. 6. Aortic Atherosclerosis (ICD10-I70.0).   02/11/2020 Breast MRI   IMPRESSION: 1. 7 millimeter focus of residual enhancement associated with the known malignancy in the 9 o'clock location of the RIGHT breast. 2. No significant enhancement in the known malignancy in the 11:30 o'clock location of the RIGHT breast. 3. Interval resolution of axillary adenopathy.   07/28/2020 Imaging   CT CAP  IMPRESSION: 1. Interval progression of hepatic metastases. 2. Stable indeterminate right adrenal nodule. 3. No new sites of metastatic disease in the chest, abdomen or pelvis. 4. Chronic findings include: Small hiatal hernia. Cholelithiasis. Aortic Atherosclerosis (ICD10-I70.0).     08/08/2020 -   Chemotherapy   Second-line Inhertu q3weeks starting 08/08/20    01/06/2021 Imaging   MRI Brain  IMPRESSION: No evidence of acute intracranial abnormality.   No evidence of intracranial metastatic disease.   Stable noncontrast MRI appearance of the brain as compared to 11/16/2019.   Mild chronic small vessel ischemic changes within the cerebral white matter.   Redemonstrated tiny chronic lacunar infarct within the right cerebellar hemisphere.   Mild paranasal sinus disease, as described   03/24/2021 Imaging   CT CAP  IMPRESSION: 1. Increase in size and visible number of hepatic metastatic lesions compatible with progressive malignancy. 2. Other imaging findings of potential clinical significance: Aortic Atherosclerosis (ICD10-I70.0). Small type 1 hiatal hernia. Prominent stool throughout the colon favors constipation. Trace free pelvic fluid, etiology uncertain. Small right adrenal adenoma.   04/10/2021 -  Chemotherapy   Patient is on Treatment Plan : BREAST Capecitabine + Trastuzumab + Tucatinib q21d        INTERVAL HISTORY:  Teresa Hays is here for a follow up of metastatic breast cancer. She was last seen by me on 05/01/21. She presents to the clinic accompanied by her sister, who added her niece (an NP) She reports she is not feeling well today. She reports she developed nausea on Wednesday. Her sister reports Lindzey has been having some neurologic symptoms-- brain fog and stiffness with walking-- since at least Saturday.   All other systems were reviewed with the patient and are negative.  MEDICAL HISTORY:  Past Medical History:  Diagnosis Date   GERD (gastroesophageal reflux disease)    Hypertension    Personal history of chemotherapy    rt breast ca  with mets to liver dx'd 03/2018    SURGICAL HISTORY: Past Surgical History:  Procedure Laterality Date   BREAST BIOPSY Right 03/24/2018   CA x3   BREAST BIOPSY Left 03/28/2018   neg   BREAST BIOPSY Left  03/30/2018   neg   COLONOSCOPY     ESOPHAGOGASTRODUODENOSCOPY ENDOSCOPY     IR IMAGING GUIDED PORT INSERTION  04/04/2018    I have reviewed the social history and family history with the patient and they are unchanged from previous note.  ALLERGIES:  has No Known Allergies.  MEDICATIONS:  Current Outpatient Medications  Medication Sig Dispense Refill   amLODipine (NORVASC) 5 MG tablet TAKE 1 TABLET BY MOUTH ONCE DAILY 30 tablet 2   apixaban (ELIQUIS) 2.5 MG TABS tablet Take 1 tablet (2.5 mg total) by mouth 2 (two) times daily. 60 tablet 1   Budeson-Glycopyrrol-Formoterol 160-9-4.8 MCG/ACT AERO INHALE 2 PUFFS BY MOUTH INTO THE LUNGS IN THE MORNING AND AT BEDTIME. 10.7 g 6   capecitabine (XELODA) 500 MG tablet Take 2 tabs in morning and 3 tabs in evening, 30 mins after meal. Take for 14 days then off for 7 days. 70 tablet 0   carvedilol (COREG) 3.125 MG tablet TAKE 1 TABLET BY MOUTH TWICE DAILY. REPLACES ATENOLOL 180 tablet 3   diphenoxylate-atropine (LOMOTIL) 2.5-0.025 MG tablet Take 1-2 tablets by mouth 4 (four) times daily as needed for diarrhea or loose stools. 30 tablet 0   DULoxetine (CYMBALTA) 20 MG capsule TAKE 2 CAPSULES BY MOUTH DAILY 60 capsule 3   gabapentin (NEURONTIN) 300 MG capsule MAY TAKE UP TO 3 CAPSULES BY MOUTH 3 TIMES DAILY 270 capsule 3   lidocaine (XYLOCAINE) 2 % jelly APPLY OVER PORT A CATH TOPICALLY 1 HOUR BEFORE PORT BEING ACCESSED AS NEEDED 30 mL 2   LORazepam (ATIVAN) 0.5 MG tablet Take 1 tablet (0.5 mg total) by mouth every 8 (eight) hours as needed for anxiety. 30 tablet 0   losartan (COZAAR) 50 MG tablet Take 1 tablet (50 mg total) by mouth daily. 180 tablet 3   meclizine (ANTIVERT) 25 MG tablet TAKE 1/2 TABLET BY MOUTH 3 TIMES DAILY AS NEEDED FOR DIZZINESS 15 tablet 2   montelukast (SINGULAIR) 10 MG tablet TAKE 1 TABLET (10 MG TOTAL) BY MOUTH AT BEDTIME. 30 tablet 5   ondansetron (ZOFRAN) 8 MG tablet TAKE 1 TABLET BY MOUTH EVERY 8 HOURS AS NEEDED FOR NAUSEA OR  VOMITING 30 tablet 3   potassium chloride SA (KLOR-CON) 20 MEQ tablet TAKE 1 TABLET BY MOUTH ONCE A DAY 30 tablet 3   prochlorperazine (COMPAZINE) 10 MG tablet TAKE 1 TABLET BY MOUTH EVERY 6 HOURS AS NEEDED FOR NAUSEA OR VOMITING 30 tablet 3   spironolactone (ALDACTONE) 25 MG tablet Take 0.5 tablets (12.5 mg total) by mouth daily. 90 tablet 3   traMADol (ULTRAM) 50 MG tablet Take 1 tablet (50 mg total) by mouth every 6 (six) hours as needed for moderate pain or severe pain. 30 tablet 0   tucatinib (TUKYSA) 150 MG tablet Take 2 tablets (300 mg total) by mouth 2 (two) times daily. Take every 12 hrs at the same time each day with or without a meal. Take as directed by MD. 120 tablet 2   Zoster Vaccine Adjuvanted (SHINGRIX) injection INJECT 0.5 MLS INTO THE MUSCLE ONCE FOR 1 DOSE. (Patient not taking: Reported on 04/10/2021) 1 each 1   No current facility-administered medications for this visit.   Facility-Administered Medications Ordered in Other Visits  Medication Dose Route Frequency Provider Last Rate Last Admin   sodium chloride flush (NS) 0.9 % injection 10 mL  10 mL Intracatheter PRN [name], MD   10 mL at 05/28/20 0935   sodium chloride flush (NS) 0.9 % injection 10 mL  10 mL Intracatheter PRN [name], MD   10 mL at 05/22/21 1105    PHYSICAL EXAMINATION: ECOG PERFORMANCE STATUS: 3 - Symptomatic, >50% confined to bed  There were no vitals filed for this visit. Wt Readings from Last 3 Encounters:  05/01/21 145 lb 11.2 oz (66.1 kg)  04/10/21 144 lb 9.6 oz (65.6 kg)  04/10/21 149 lb 8 oz (67.8 kg)     GENERAL: (+) generalized weakness SKIN: skin color, texture, turgor are normal, no rashes or significant lesions EYES: normal, Conjunctiva are pink and non-injected, sclera clear Musculoskeletal:no cyanosis of digits and no clubbing  NEURO: alert & oriented x 3 with fluent speech, no focal motor/sensory deficits  LABORATORY DATA:  I have reviewed the data as listed CBC Latest Ref  Rng & Units 05/22/2021 05/01/2021 04/10/2021  WBC 4.0 - 10.5 K/uL 5.8 4.5 4.9  Hemoglobin 12.0 - 15.0 g/dL 11.8(L) 10.6(L) 10.8(L)  Hematocrit 36.0 - 46.0 % 34.1(L) 30.9(L) 32.4(L)  Platelets 150 - 400 K/uL 209 137(L) 162     CMP Latest Ref Rng & Units 05/01/2021 04/10/2021 03/27/2021  Glucose 70 - 99 mg/dL 134(H) 101(H) 116(H)  BUN 8 - 23 mg/dL 19 25(H) 14  Creatinine 0.44 - 1.00 mg/dL 1.53(H) 1.23(H) 1.53(H)  Sodium 135 - 145 mmol/L 132(L) 131(L) 135  Potassium 3.5 - 5.1 mmol/L 4.1 4.8 4.4  Chloride 98 - 111 mmol/L 101 102 104  CO2 22 - 32 mmol/L 22 20(L) 23  Calcium 8.9 - 10.3 mg/dL 9.9 10.3 9.9  Total Protein 6.5 - 8.1 g/dL 7.8 7.6 6.7  Total Bilirubin 0.3 - 1.2 mg/dL 1.0 1.2 0.5  Alkaline Phos 38 - 126 U/L 282(H) 451(H) 372(H)  AST 15 - 41 U/L 74(H) 79(H) 53(H)  ALT 0 - 44 U/L 44 49(H) 32      RADIOGRAPHIC STUDIES: I have personally reviewed the radiological images as listed and agreed with the findings in the report. No results found.    No orders of the defined types were placed in this encounter.  All questions were answered. The patient knows to call the clinic with any problems, questions or concerns. No barriers to learning was detected. The total time spent in the appointment was 30 minutes.     [name], MD 05/22/2021   I, Wilburn Mylar, am acting as scribe for [name], MD.   I have reviewed the above documentation for accuracy and completeness, and I agree with the above.

## 2021-05-22 NOTE — Progress Notes (Signed)
Appt for [place] w/lab, 1L NS over 2hrs, and [facility] placed per verbal orders from Dr. Burr Medico.

## 2021-05-22 NOTE — Patient Instructions (Signed)
[facility]  Discharge Instructions: Thank you for choosing [facility] to provide your oncology and hematology care.   If you have a lab appointment with the [facility], please go directly to the [facility] and check in at the registration area.   Wear comfortable clothing and clothing appropriate for easy access to any Portacath or PICC line.   We strive to give you quality time with your provider. You may need to reschedule your appointment if you arrive late (15 or more minutes).  Arriving late affects you and other patients whose appointments are after yours.  Also, if you miss three or more appointments without notifying the office, you may be dismissed from the clinic at the provider's discretion.      For prescription refill requests, have your pharmacy contact our office and allow 72 hours for refills to be completed.    Today you received the following chemotherapy and/or immunotherapy agents Sodium Chloride Intravenous Fluids      To help prevent nausea and vomiting after your treatment, we encourage you to take your nausea medication as directed.  BELOW ARE SYMPTOMS THAT SHOULD BE REPORTED IMMEDIATELY: *FEVER GREATER THAN 100.4 F (38 C) OR HIGHER *CHILLS OR SWEATING *NAUSEA AND VOMITING THAT IS NOT CONTROLLED WITH YOUR NAUSEA MEDICATION *UNUSUAL SHORTNESS OF BREATH *UNUSUAL BRUISING OR BLEEDING *URINARY PROBLEMS (pain or burning when urinating, or frequent urination) *BOWEL PROBLEMS (unusual diarrhea, constipation, pain near the anus) TENDERNESS IN MOUTH AND THROAT WITH OR WITHOUT PRESENCE OF ULCERS (sore throat, sores in mouth, or a toothache) UNUSUAL RASH, SWELLING OR PAIN  UNUSUAL VAGINAL DISCHARGE OR ITCHING   Items with * indicate a potential emergency and should be followed up as soon as possible or go to the Emergency Department if any problems should occur.  Please show the CHEMOTHERAPY ALERT CARD or IMMUNOTHERAPY  ALERT CARD at check-in to the Emergency Department and triage nurse.  Should you have questions after your visit or need to cancel or reschedule your appointment, please contact [facility]  Dept: [phone]  and follow the prompts.  Office hours are 8:00 a.m. to 4:30 p.m. Monday - Friday. Please note that voicemails left after 4:00 p.m. may not be returned until the following business day.  We are closed weekends and major holidays. You have access to a nurse at all times for urgent questions. Please call the main number to the clinic Dept: [phone] and follow the prompts.   For any non-urgent questions, you may also contact your provider using MyChart. We now offer e-Visits for anyone 30 and older to request care online for non-urgent symptoms. For details visit [email or web address].   Also download the MyChart app! Go to the app store, search "MyChart", open the app, select [facility], and log in with your MyChart username and password.  Due to Covid, a mask is required upon entering the hospital/clinic. If you do not have a mask, one will be given to you upon arrival. For doctor visits, patients may have 1 support person aged 11 or older with them. For treatment visits, patients cannot have anyone with them due to current Covid guidelines and our immunocompromised population.

## 2021-05-23 ENCOUNTER — Encounter: Payer: Self-pay | Admitting: Hematology

## 2021-05-23 LAB — CANCER ANTIGEN 27.29: CA 27.29: 741.9 U/mL — ABNORMAL HIGH (ref 0.0–38.6)

## 2021-05-25 ENCOUNTER — Other Ambulatory Visit: Payer: Medicare Other

## 2021-05-25 ENCOUNTER — Encounter (HOSPITAL_COMMUNITY): Payer: Self-pay | Admitting: Radiology

## 2021-05-25 ENCOUNTER — Emergency Department (HOSPITAL_COMMUNITY): Payer: Medicare Other

## 2021-05-25 ENCOUNTER — Ambulatory Visit: Payer: Medicare Other

## 2021-05-25 ENCOUNTER — Other Ambulatory Visit: Payer: Self-pay

## 2021-05-25 ENCOUNTER — Inpatient Hospital Stay (HOSPITAL_COMMUNITY)
Admission: EM | Admit: 2021-05-25 | Discharge: 2021-05-31 | DRG: 682 | Disposition: A | Discharge status: Home Health | Payer: Medicare Other | Attending: Internal Medicine | Admitting: Internal Medicine

## 2021-05-25 DIAGNOSIS — I429 Cardiomyopathy, unspecified: Secondary | ICD-10-CM | POA: Diagnosis present

## 2021-05-25 DIAGNOSIS — R627 Adult failure to thrive: Secondary | ICD-10-CM | POA: Diagnosis present

## 2021-05-25 DIAGNOSIS — R531 Weakness: Secondary | ICD-10-CM | POA: Diagnosis not present

## 2021-05-25 DIAGNOSIS — E860 Dehydration: Secondary | ICD-10-CM | POA: Diagnosis present

## 2021-05-25 DIAGNOSIS — I82629 Acute embolism and thrombosis of deep veins of unspecified upper extremity: Secondary | ICD-10-CM

## 2021-05-25 DIAGNOSIS — Z66 Do not resuscitate: Secondary | ICD-10-CM | POA: Diagnosis present

## 2021-05-25 DIAGNOSIS — T451X5A Adverse effect of antineoplastic and immunosuppressive drugs, initial encounter: Secondary | ICD-10-CM

## 2021-05-25 DIAGNOSIS — G9341 Metabolic encephalopathy: Secondary | ICD-10-CM | POA: Diagnosis present

## 2021-05-25 DIAGNOSIS — Z9181 History of falling: Secondary | ICD-10-CM

## 2021-05-25 DIAGNOSIS — Z833 Family history of diabetes mellitus: Secondary | ICD-10-CM

## 2021-05-25 DIAGNOSIS — N179 Acute kidney failure, unspecified: Principal | ICD-10-CM | POA: Diagnosis present

## 2021-05-25 DIAGNOSIS — C787 Secondary malignant neoplasm of liver and intrahepatic bile duct: Secondary | ICD-10-CM | POA: Diagnosis present

## 2021-05-25 DIAGNOSIS — Z7901 Long term (current) use of anticoagulants: Secondary | ICD-10-CM

## 2021-05-25 DIAGNOSIS — Z79899 Other long term (current) drug therapy: Secondary | ICD-10-CM

## 2021-05-25 DIAGNOSIS — C50911 Malignant neoplasm of unspecified site of right female breast: Secondary | ICD-10-CM | POA: Diagnosis present

## 2021-05-25 DIAGNOSIS — E876 Hypokalemia: Secondary | ICD-10-CM | POA: Diagnosis present

## 2021-05-25 DIAGNOSIS — Z20822 Contact with and (suspected) exposure to covid-19: Secondary | ICD-10-CM | POA: Diagnosis present

## 2021-05-25 DIAGNOSIS — E7220 Disorder of urea cycle metabolism, unspecified: Secondary | ICD-10-CM | POA: Diagnosis present

## 2021-05-25 DIAGNOSIS — Z170 Estrogen receptor positive status [ER+]: Secondary | ICD-10-CM

## 2021-05-25 DIAGNOSIS — Z853 Personal history of malignant neoplasm of breast: Secondary | ICD-10-CM

## 2021-05-25 DIAGNOSIS — E872 Acidosis, unspecified: Secondary | ICD-10-CM | POA: Diagnosis not present

## 2021-05-25 DIAGNOSIS — J45909 Unspecified asthma, uncomplicated: Secondary | ICD-10-CM | POA: Diagnosis present

## 2021-05-25 DIAGNOSIS — Z1501 Genetic susceptibility to malignant neoplasm of breast: Secondary | ICD-10-CM | POA: Diagnosis not present

## 2021-05-25 DIAGNOSIS — C50919 Malignant neoplasm of unspecified site of unspecified female breast: Secondary | ICD-10-CM

## 2021-05-25 DIAGNOSIS — E861 Hypovolemia: Secondary | ICD-10-CM | POA: Diagnosis not present

## 2021-05-25 DIAGNOSIS — E8809 Other disorders of plasma-protein metabolism, not elsewhere classified: Secondary | ICD-10-CM | POA: Diagnosis present

## 2021-05-25 DIAGNOSIS — E875 Hyperkalemia: Secondary | ICD-10-CM | POA: Diagnosis present

## 2021-05-25 DIAGNOSIS — I129 Hypertensive chronic kidney disease with stage 1 through stage 4 chronic kidney disease, or unspecified chronic kidney disease: Secondary | ICD-10-CM | POA: Diagnosis present

## 2021-05-25 DIAGNOSIS — N182 Chronic kidney disease, stage 2 (mild): Secondary | ICD-10-CM | POA: Diagnosis present

## 2021-05-25 DIAGNOSIS — Z9221 Personal history of antineoplastic chemotherapy: Secondary | ICD-10-CM

## 2021-05-25 DIAGNOSIS — I9589 Other hypotension: Secondary | ICD-10-CM

## 2021-05-25 DIAGNOSIS — I82622 Acute embolism and thrombosis of deep veins of left upper extremity: Secondary | ICD-10-CM | POA: Diagnosis present

## 2021-05-25 DIAGNOSIS — D638 Anemia in other chronic diseases classified elsewhere: Secondary | ICD-10-CM | POA: Diagnosis present

## 2021-05-25 DIAGNOSIS — E43 Unspecified severe protein-calorie malnutrition: Secondary | ICD-10-CM | POA: Diagnosis present

## 2021-05-25 DIAGNOSIS — Z803 Family history of malignant neoplasm of breast: Secondary | ICD-10-CM

## 2021-05-25 DIAGNOSIS — E871 Hypo-osmolality and hyponatremia: Secondary | ICD-10-CM | POA: Diagnosis present

## 2021-05-25 DIAGNOSIS — G620 Drug-induced polyneuropathy: Secondary | ICD-10-CM

## 2021-05-25 DIAGNOSIS — I959 Hypotension, unspecified: Secondary | ICD-10-CM | POA: Diagnosis present

## 2021-05-25 DIAGNOSIS — Z8261 Family history of arthritis: Secondary | ICD-10-CM

## 2021-05-25 DIAGNOSIS — G9340 Encephalopathy, unspecified: Secondary | ICD-10-CM | POA: Diagnosis not present

## 2021-05-25 DIAGNOSIS — K219 Gastro-esophageal reflux disease without esophagitis: Secondary | ICD-10-CM | POA: Diagnosis present

## 2021-05-25 DIAGNOSIS — Z86718 Personal history of other venous thrombosis and embolism: Secondary | ICD-10-CM

## 2021-05-25 DIAGNOSIS — Z7951 Long term (current) use of inhaled steroids: Secondary | ICD-10-CM

## 2021-05-25 LAB — CBC WITH DIFFERENTIAL/PLATELET
Abs Immature Granulocytes: 0.03 10*3/uL (ref 0.00–0.07)
Basophils Absolute: 0.1 10*3/uL (ref 0.0–0.1)
Basophils Relative: 1 %
Eosinophils Absolute: 0.2 10*3/uL (ref 0.0–0.5)
Eosinophils Relative: 3 %
HCT: 37.2 % (ref 36.0–46.0)
Hemoglobin: 12.5 g/dL (ref 12.0–15.0)
Immature Granulocytes: 1 %
Lymphocytes Relative: 26 %
Lymphs Abs: 1.5 10*3/uL (ref 0.7–4.0)
MCH: 28.5 pg (ref 26.0–34.0)
MCHC: 33.6 g/dL (ref 30.0–36.0)
MCV: 84.7 fL (ref 80.0–100.0)
Monocytes Absolute: 0.5 10*3/uL (ref 0.1–1.0)
Monocytes Relative: 9 %
Neutro Abs: 3.5 10*3/uL (ref 1.7–7.7)
Neutrophils Relative %: 60 %
Platelets: 225 10*3/uL (ref 150–400)
RBC: 4.39 MIL/uL (ref 3.87–5.11)
RDW: 17.4 % — ABNORMAL HIGH (ref 11.5–15.5)
WBC: 5.8 10*3/uL (ref 4.0–10.5)
nRBC: 1.2 % — ABNORMAL HIGH (ref 0.0–0.2)

## 2021-05-25 LAB — TYPE AND SCREEN
ABO/RH(D): O POS
Antibody Screen: NEGATIVE

## 2021-05-25 LAB — COMPREHENSIVE METABOLIC PANEL
ALT: 127 U/L — ABNORMAL HIGH (ref 0–44)
AST: 340 U/L — ABNORMAL HIGH (ref 15–41)
Albumin: 3.4 g/dL — ABNORMAL LOW (ref 3.5–5.0)
Alkaline Phosphatase: 341 U/L — ABNORMAL HIGH (ref 38–126)
Anion gap: 8 (ref 5–15)
BUN: 29 mg/dL — ABNORMAL HIGH (ref 8–23)
CO2: 23 mmol/L (ref 22–32)
Calcium: 9.8 mg/dL (ref 8.9–10.3)
Chloride: 98 mmol/L (ref 98–111)
Creatinine, Ser: 2.35 mg/dL — ABNORMAL HIGH (ref 0.44–1.00)
GFR, Estimated: 23 mL/min — ABNORMAL LOW (ref >60)
Glucose, Bld: 118 mg/dL — ABNORMAL HIGH (ref 70–99)
Potassium: 5.3 mmol/L — ABNORMAL HIGH (ref 3.5–5.1)
Sodium: 129 mmol/L — ABNORMAL LOW (ref 135–145)
Total Bilirubin: 1.7 mg/dL — ABNORMAL HIGH (ref 0.3–1.2)
Total Protein: 7.4 g/dL (ref 6.5–8.1)

## 2021-05-25 LAB — RESP PANEL BY RT-PCR (FLU A&B, COVID) ARPGX2
Influenza A by PCR: NEGATIVE
Influenza B by PCR: NEGATIVE
SARS Coronavirus 2 by RT PCR: NEGATIVE

## 2021-05-25 LAB — TROPONIN I (HIGH SENSITIVITY)
Troponin I (High Sensitivity): 4 ng/L (ref <18)
Troponin I (High Sensitivity): 5 ng/L (ref <18)

## 2021-05-25 LAB — LACTIC ACID, PLASMA: Lactic Acid, Venous: 1.5 mmol/L (ref 0.5–1.9)

## 2021-05-25 LAB — PROTIME-INR
INR: 1.6 — ABNORMAL HIGH (ref 0.8–1.2)
Prothrombin Time: 18.8 seconds — ABNORMAL HIGH (ref 11.4–15.2)

## 2021-05-25 MED ORDER — SODIUM CHLORIDE 0.9 % IV SOLN: INTRAVENOUS | Status: DC

## 2021-05-25 MED ORDER — SODIUM CHLORIDE 0.9 % IV SOLN: Freq: Once | INTRAVENOUS | Status: DC

## 2021-05-25 MED ORDER — GABAPENTIN 300 MG PO CAPS
300.0000 mg | ORAL_CAPSULE | Freq: Three times a day (TID) | ORAL | Status: DC
  Administered 2021-05-25 – 2021-05-31 (×11): 300 mg via ORAL
  Filled 2021-05-25 (×13): qty 1

## 2021-05-25 MED ORDER — APIXABAN 2.5 MG PO TABS
2.5000 mg | ORAL_TABLET | Freq: Two times a day (BID) | ORAL | Status: DC
  Administered 2021-05-25 – 2021-05-26 (×2): 2.5 mg via ORAL
  Filled 2021-05-25 (×4): qty 1

## 2021-05-25 MED ORDER — DULOXETINE HCL 20 MG PO CPEP
20.0000 mg | ORAL_CAPSULE | Freq: Every day | ORAL | Status: DC
  Administered 2021-05-28 – 2021-05-31 (×3): 20 mg via ORAL
  Filled 2021-05-25 (×6): qty 1

## 2021-05-25 MED ORDER — MONTELUKAST SODIUM 10 MG PO TABS
10.0000 mg | ORAL_TABLET | Freq: Every day | ORAL | Status: DC
  Administered 2021-05-25 – 2021-05-30 (×4): 10 mg via ORAL
  Filled 2021-05-25 (×5): qty 1

## 2021-05-25 MED ORDER — SODIUM CHLORIDE 0.9 % IV BOLUS
500.0000 mL | Freq: Once | INTRAVENOUS | Status: AC
  Administered 2021-05-25: 500 mL via INTRAVENOUS

## 2021-05-25 NOTE — ED Provider Notes (Addendum)
[facility] DEPT Provider Note   CSN: [id number] Arrival date & time: 05/25/21  1444     History Chief Complaint  Patient presents with   Weakness    Teresa Hays is a 65 y.o. female.  65 year old female with medical history detailed below presents for evaluation.  Patient with advanced metastatic breast cancer.  Patient is on palliative chemo.  Patient with significant decreased p.o. intake over the last 2 to 3 days.  Patient complains of significant weakness.  EMS reported mild hypotension with systolic BP in the 85I on their initial evaluation.  Per oncology note, palliative care consult initiated today.  The history is provided by the patient and medical records.  Weakness Severity:  Moderate Onset quality:  Gradual Duration:  3 days Timing:  Constant Progression:  Worsening Chronicity:  New     Past Medical History:  Diagnosis Date   GERD (gastroesophageal reflux disease)    Hypertension    Personal history of chemotherapy    rt breast ca with mets to liver dx'd 03/2018    Patient Active Problem List   Diagnosis Date Noted   Mild intermittent asthma without complication 62/70/3500   Asthma 04/02/2020   Cough, persistent 11/24/2019   Shortness of breath 11/23/2019   Gastroesophageal reflux disease 10/16/2019   Health maintenance examination 10/16/2019   Dysphagia 10/12/2019   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     Family History  Problem Relation Age of Onset   Diabetes Mother    Cancer Sister 36       breast cancer   Breast cancer Sister    Rheum arthritis Sister     Social History   Tobacco Use   Smoking status: Never   Smokeless tobacco: Never  Vaping Use   Vaping Use: Never used  Substance Use Topics   Alcohol use: No   Drug use: Never    Home Medications Prior to Admission medications   Medication Sig Start Date End Date Taking? Authorizing Provider  amLODipine (NORVASC) 5 MG tablet TAKE 1 TABLET BY MOUTH ONCE DAILY 04/27/21 04/27/22  Autry-Lott, Naaman Plummer, DO  apixaban (ELIQUIS) 2.5 MG TABS tablet Take 1 tablet (2.5 mg total) by mouth 2 (two) times daily. 03/27/21   [name], MD  Budeson-Glycopyrrol-Formoterol 160-9-4.8 MCG/ACT AERO INHALE 2 PUFFS BY MOUTH INTO THE LUNGS IN THE MORNING AND AT BEDTIME. 09/11/20 09/11/21  Icard, [name] L, DO  capecitabine (XELODA) 500 MG tablet Take 2 tabs in morning and 3 tabs in evening, 30 mins after meal. Take for 14 days then off for 7 days. 03/27/21   [name], MD  carvedilol (COREG) 3.125 MG tablet TAKE 1 TABLET BY MOUTH TWICE DAILY. REPLACES ATENOLOL 02/04/21 02/04/22  Bensimhon, Shaune Pascal, MD  diphenoxylate-atropine (LOMOTIL) 2.5-0.025 MG tablet Take 1-2 tablets by mouth 4 (four) times daily as needed for diarrhea or loose stools.  01/23/21   [name], MD  DULoxetine (CYMBALTA) 20 MG capsule TAKE 2 CAPSULES BY MOUTH DAILY 04/06/21 04/06/22  [name], MD  gabapentin (NEURONTIN) 300 MG capsule MAY TAKE UP TO 3 CAPSULES BY MOUTH 3 TIMES DAILY 04/20/21 04/20/22  Alla Feeling, NP  lidocaine (XYLOCAINE) 2 % jelly APPLY OVER PORT A CATH TOPICALLY 1 HOUR BEFORE PORT BEING ACCESSED AS NEEDED 07/04/20 07/04/21  [name], MD  LORazepam (ATIVAN) 0.5 MG tablet Take 1 tablet (0.5 mg total) by mouth every 8 (eight) hours as needed for anxiety. 02/20/21   [name], MD  losartan (COZAAR) 50 MG tablet Take 1 tablet (50 mg total) by  mouth daily. 04/10/21   Bensimhon, Shaune Pascal, MD  meclizine (ANTIVERT) 25 MG tablet TAKE 1/2 TABLET BY MOUTH 3 TIMES DAILY AS NEEDED FOR DIZZINESS 02/20/21 02/20/22  [name], MD  montelukast (SINGULAIR) 10 MG tablet TAKE 1 TABLET (10 MG TOTAL) BY MOUTH AT BEDTIME. 11/24/20 11/24/21  Martyn Ehrich, NP  ondansetron (ZOFRAN) 8 MG tablet TAKE 1 TABLET BY MOUTH EVERY 8 HOURS AS NEEDED FOR NAUSEA OR VOMITING 08/08/20 08/08/21  [name], MD  potassium chloride SA (KLOR-CON) 20 MEQ tablet TAKE 1 TABLET BY MOUTH ONCE A DAY 01/26/21 01/26/22  [name], MD  prochlorperazine (COMPAZINE) 10 MG tablet TAKE 1 TABLET BY MOUTH EVERY 6 HOURS AS NEEDED FOR NAUSEA OR VOMITING 08/08/20 08/08/21  [name], MD  spironolactone (ALDACTONE) 25 MG tablet Take 0.5 tablets (12.5 mg total) by mouth daily. 04/10/21 04/10/22  Bensimhon, Shaune Pascal, MD  traMADol (ULTRAM) 50 MG tablet Take 1 tablet (50 mg total) by mouth every 6 (six) hours as needed for moderate pain or severe pain. 12/29/20   [name], MD  tucatinib (TUKYSA) 150 MG tablet Take 2 tablets (300 mg total) by mouth 2 (two) times daily. Take every 12 hrs at the same time each day with or without a meal. Take as directed by MD. 05/01/21   [name], MD  Zoster Vaccine Adjuvanted [facility]) injection INJECT 0.5 MLS INTO THE MUSCLE ONCE FOR 1 DOSE. Patient not taking: Reported on 04/10/2021 08/20/20 08/20/21  Autry-Lott, Naaman Plummer, DO  albuterol (VENTOLIN HFA) 108 (90 Base) MCG/ACT inhaler Inhale 1-2 puffs into the lungs every 6 (six) hours as needed for wheezing or shortness of breath. 08/21/20 09/19/20  Garner Nash, DO  budesonide-formoterol (SYMBICORT) 160-4.5 MCG/ACT inhaler Inhale 2 puffs into the lungs 2 (two) times daily. Rinse mouth after each use. 08/21/20 08/29/20  Garner Nash, DO    Allergies    Patient has no known allergies.  Review of Systems   Review of Systems  Neurological:  Positive for weakness.  All other systems reviewed and are negative.  Physical  Exam Updated Vital Signs BP 91/74 (BP Location: Left Arm)   Pulse 87   Temp 98.2 F (36.8 C) (Oral)   Resp 16   Ht 5\' 4"  (1.626 m)   Wt 63.5 kg   SpO2 94%   BMI 24.03 kg/m   Physical Exam Vitals and nursing note reviewed.  Constitutional:      General: She is not in acute distress.    Appearance: Normal appearance. She is well-developed.  HENT:     Head: Normocephalic and atraumatic.     Mouth/Throat:     Mouth: Mucous membranes are dry.  Eyes:     Conjunctiva/sclera: Conjunctivae normal.     Pupils: Pupils are equal, round, and reactive to light.  Cardiovascular:  Rate and Rhythm: Normal rate and regular rhythm.     Heart sounds: Normal heart sounds.  Pulmonary:     Effort: Pulmonary effort is normal. No respiratory distress.     Breath sounds: Normal breath sounds.  Abdominal:     General: There is no distension.     Palpations: Abdomen is soft.     Tenderness: There is no abdominal tenderness.  Musculoskeletal:        General: No deformity. Normal range of motion.     Cervical back: Normal range of motion and neck supple.  Skin:    General: Skin is warm and dry.  Neurological:     General: No focal deficit present.     Mental Status: She is alert and oriented to person, place, and time.    ED Results / Procedures / Treatments   Labs (all labs ordered are listed, but only abnormal results are displayed) Labs Reviewed  CBC WITH DIFFERENTIAL/PLATELET - Abnormal; Notable for the following components:      Result Value   RDW 17.4 (*)    nRBC 1.2 (*)    All other components within normal limits  PROTIME-INR - Abnormal; Notable for the following components:   Prothrombin Time 18.8 (*)    INR 1.6 (*)    All other components within normal limits  CULTURE, BLOOD (ROUTINE X 2)  CULTURE, BLOOD (ROUTINE X 2)  RESP PANEL BY RT-PCR (FLU A&B, COVID) ARPGX2  LACTIC ACID, PLASMA  COMPREHENSIVE METABOLIC PANEL  LACTIC ACID, PLASMA  URINALYSIS, ROUTINE W REFLEX  MICROSCOPIC  TYPE AND SCREEN  TROPONIN I (HIGH SENSITIVITY)    EKG EKG Interpretation  Date/Time:  Monday May 25 2021 15:14:56 EST Ventricular Rate:  89 PR Interval:  164 QRS Duration: 86 QT Interval:  351 QTC Calculation: 427 R Axis:   52 Text Interpretation: Sinus rhythm Probable anteroseptal infarct, old Borderline T abnormalities, inferior leads Baseline wander in lead(s) V2 Confirmed by Dene Gentry [phone]) on 05/25/2021 3:25:32 PM  Radiology CT Head Wo Contrast  Result Date: 05/25/2021 CLINICAL DATA:  Altered mental status. Undergoing chemotherapy for stage IV breast cancer. EXAM: CT HEAD WITHOUT CONTRAST TECHNIQUE: Contiguous axial images were obtained from the base of the skull through the vertex without intravenous contrast. COMPARISON:  Brain MR dated 01/06/2021.  Head CT dated 10/25/2005. FINDINGS: Brain: Normal appearing cerebral hemispheres and posterior fossa structures. Normal size and position of the ventricles. No intracranial hemorrhage, mass lesion or CT evidence of acute infarction. Vascular: No hyperdense vessel or unexpected calcification. Skull: Normal. Negative for fracture or focal lesion. Sinuses/Orbits: No acute finding. Other: None. IMPRESSION: No acute abnormality. Electronically Signed   By: Claudie Revering M.D.   On: 05/25/2021 16:05   DG Chest Port 1 View  Result Date: 05/25/2021 CLINICAL DATA:  Weakness. Undergoing chemotherapy for stage IV breast cancer. EXAM: PORTABLE CHEST 1 VIEW COMPARISON:  01/29/2020. Chest, abdomen and pelvis CT dated 03/24/2021. FINDINGS: Normal sized heart. Clear lungs. Left jugular porta catheter tip in the inferior aspect of the superior vena cava near the superior cavoatrial junction. Mild scoliosis. IMPRESSION: No acute abnormality. Electronically Signed   By: Claudie Revering M.D.   On: 05/25/2021 15:41    Procedures Procedures   Medications Ordered in ED Medications  sodium chloride 0.9 % bolus 500 mL (500 mLs Intravenous  New Bag/Given 05/25/21 1617)    ED Course  I have reviewed the triage vital signs and the nursing notes.  Pertinent labs & imaging results  that were available during my care of the patient were reviewed by me and considered in my medical decision making (see chart for details).    MDM Rules/Calculators/A&P                           MDM  MSE complete  Teresa Hays was evaluated in Emergency Department on 05/25/2021 for the symptoms described in the history of present illness. She was evaluated in the context of the global COVID-19 pandemic, which necessitated consideration that the patient might be at risk for infection with the SARS-CoV-2 virus that causes COVID-19. Institutional protocols and algorithms that pertain to the evaluation of patients at risk for COVID-19 are in a state of rapid change based on information released by regulatory bodies including the CDC and federal and state organizations. These policies and algorithms were followed during the patient's care in the ED.  Patient with decreased p.o. intake for the last 3 to 4 days.  Patient's exam is suggestive of significant dehydration.  Labs are reflective of this.  Patient with family who requests admission for IV fluids.  Of note, patient with palliative care consult initiated by oncology team today.  Patient would likely benefit from palliative consult while inpatient.  Hospitalist service is aware of case and will evaluate for admission.    Final Clinical Impression(s) / ED Diagnoses Final diagnoses:  Dehydration  AKI (acute kidney injury) [facility])    Rx / DC Orders ED Discharge Orders     None        Valarie Merino, MD 05/25/21 1743    Valarie Merino, MD 05/25/21 1743

## 2021-05-25 NOTE — ED Notes (Signed)
Patients niece would like a call back : Donneta Romberg [phone]

## 2021-05-25 NOTE — ED Triage Notes (Addendum)
Pt is a stage 4 breast cancer patient. Pt has been unable to eat or drink for the past three days. Pt had last chemo treatment on Friday. Pt also states they are stopping this particular treatment due to how ill it is making her. Oncologist told her to come to the hospital. Pt hypotensive when EMS arrived.

## 2021-05-25 NOTE — ED Notes (Signed)
Patients daughter would like a call back : [name] [phone]

## 2021-05-25 NOTE — ED Notes (Signed)
Attempted to call pt daughter [name] back with an update and the phone went to voicemail. Will attempt again later. Pt is resting comfortably on the stretcher. Encouraged to try to urinate as she has a purewick on and she has had no urine output since arrival.

## 2021-05-25 NOTE — Progress Notes (Addendum)
[facility]) Hospital Liaison note:  Notified by Dr. Burr Medico of request for [facility] Palliative Care services.   Will continue to follow for disposition.  Please call with any outpatient palliative questions or concerns.  Thank you for the opportunity to participate in this patient's care.  Thank you, Lorelee Market, LPN [facility] Liaison [phone]

## 2021-05-25 NOTE — H&P (Signed)
History and Physical    Teresa Hays HBZ:[id number] DOB: 1955-11-17 DOA: 05/25/2021  PCP: [name], DO   Chief Complaint: Failure to thrive  HPI: Teresa Hays is a 65 y.o. 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 intake mental status fatigue weakness.  Patient follows closely with Dr. Burr Medico oncology, most recent visit just last week for similar episode of worsening p.o. intake malaise and questionable syncope.  Worsening jaundice nausea per family with brought to the ED for further evaluation and treatment.  In the ED patient was noted to have multiple lab abnormalities consistent with dehydration and likely worsening metastatic disease with new liver mets given hyponatremia hyperkalemia elevated creatinine BUN AST ALT alk phos bilirubin and hypoalbuminemia.  Patient admitted for IV fluids and supportive care, will need close monitoring until oncology and palliative care can evaluate the patient for goals of care discussion in the next 24 to 48 hours.  Review of systems limited at bedside given the patient's mental status  Assessment/Plan   Failure to thrive in adult Severe protein caloric malnutrition Weakness with ambulatory dysfunction, increased fall risk -Continue IV fluids, hold home medications in the setting of borderline hypotension -Advance diet as tolerated -Goals of care discussion and treatment discussion for oncology and palliative care in the next 24 hours, concerned she is not tolerating treatment well in the setting of metastatic disease.  Acute metabolic encephalopathy in the setting of above  -Likely secondary to above -Rule out concurrent hepatic encephalopathy, polypharmacy versus worsening metastatic disease -Continue  supportive care, if no improvement in the next 24 to 48 hours would likely need to have further discussion about goals of care as below  Metastatic R breast cancer with mets to liver; stage 4 Recent imaging shows progressive disease on  palliative chemo with Dr Burr Medico -Defer to oncology, appreciate insight and recommendations for ongoing medical care treatment and prognosis discussion  Goals of Care Palliative care following in the outpatient setting, appreciate insight recommendations  LUE DVT, chronic -Continue Eliquis 2.5 twice daily  HTN -Currently hypotensive, hold antihypertensive medications, slowly reintroduce as appropriate  Anemia of chronic disease -Chronic anemia around 9-10 -Currently appears to be within normal limits likely hemoconcentration, suspect hemodilution of the next 48 hours  Asthma -Resume home inhaled medications once verified  DVT prophylaxis: Eliquis 2.5 twice daily Code Status: Full code Family Communication:  None present  Status is: Inpatient  Dispo: The patient is from: Home              Anticipated d/c is to: To be determined              Anticipated d/c date is: 48 to 72 hours pending clinical course              Patient currently not medically stable for discharge given ongoing need for close monitoring, IV fluids in the setting of failure to thrive advanced metastatic disease hypotension  Consultants:  Oncology, palliative care  Procedures:  None planned   Past Medical History:  Diagnosis Date   GERD (gastroesophageal reflux disease)    Hypertension    Personal history of chemotherapy    rt breast ca with mets to liver dx'd 03/2018    Past Surgical History:  Procedure Laterality Date   BREAST BIOPSY Right 03/24/2018  CA x3   BREAST BIOPSY Left 03/28/2018   neg   BREAST BIOPSY Left 03/30/2018   neg   COLONOSCOPY     ESOPHAGOGASTRODUODENOSCOPY ENDOSCOPY     IR IMAGING GUIDED PORT INSERTION  04/04/2018     reports that she has  never smoked. She has never used smokeless tobacco. She reports that she does not drink alcohol and does not use drugs.  No Known Allergies  Family History  Problem Relation Age of Onset   Diabetes Mother    Cancer Sister 32       breast cancer   Breast cancer Sister    Rheum arthritis Sister     Prior to Admission medications   Medication Sig Start Date End Date Taking? Authorizing Provider  amLODipine (NORVASC) 5 MG tablet TAKE 1 TABLET BY MOUTH ONCE DAILY 04/27/21 04/27/22  Autry-Lott, Naaman Plummer, DO  apixaban (ELIQUIS) 2.5 MG TABS tablet Take 1 tablet (2.5 mg total) by mouth 2 (two) times daily. 03/27/21   [name], MD  Budeson-Glycopyrrol-Formoterol 160-9-4.8 MCG/ACT AERO INHALE 2 PUFFS BY MOUTH INTO THE LUNGS IN THE MORNING AND AT BEDTIME. 09/11/20 09/11/21  Icard, [name] L, DO  capecitabine (XELODA) 500 MG tablet Take 2 tabs in morning and 3 tabs in evening, 30 mins after meal. Take for 14 days then off for 7 days. 03/27/21   [name], MD  carvedilol (COREG) 3.125 MG tablet TAKE 1 TABLET BY MOUTH TWICE DAILY. REPLACES ATENOLOL 02/04/21 02/04/22  Bensimhon, Shaune Pascal, MD  diphenoxylate-atropine (LOMOTIL) 2.5-0.025 MG tablet Take 1-2 tablets by mouth 4 (four) times daily as needed for diarrhea or loose stools. 01/23/21   [name], MD  DULoxetine (CYMBALTA) 20 MG capsule TAKE 2 CAPSULES BY MOUTH DAILY 04/06/21 04/06/22  [name], MD  gabapentin (NEURONTIN) 300 MG capsule MAY TAKE UP TO 3 CAPSULES BY MOUTH 3 TIMES DAILY 04/20/21 04/20/22  Alla Feeling, NP  lidocaine (XYLOCAINE) 2 % jelly APPLY OVER PORT A CATH TOPICALLY 1 HOUR BEFORE PORT BEING ACCESSED AS NEEDED 07/04/20 07/04/21  [name], MD  LORazepam (ATIVAN) 0.5 MG tablet Take 1 tablet (0.5 mg total) by mouth every 8 (eight) hours as needed for anxiety. 02/20/21   [name], MD  losartan (COZAAR) 50 MG tablet Take 1 tablet (50 mg total) by mouth daily. 04/10/21   Bensimhon, Shaune Pascal, MD  meclizine (ANTIVERT) 25 MG tablet TAKE 1/2 TABLET BY  MOUTH 3 TIMES DAILY AS NEEDED FOR DIZZINESS 02/20/21 02/20/22  [name], MD  montelukast (SINGULAIR) 10 MG tablet TAKE 1 TABLET (10 MG TOTAL) BY MOUTH AT BEDTIME. 11/24/20 11/24/21  Martyn Ehrich, NP  ondansetron (ZOFRAN) 8 MG tablet TAKE 1 TABLET BY MOUTH EVERY 8 HOURS AS NEEDED FOR NAUSEA OR VOMITING 08/08/20 08/08/21  [name], MD  potassium chloride SA (KLOR-CON) 20 MEQ tablet TAKE 1 TABLET BY MOUTH ONCE A DAY 01/26/21 01/26/22  [name], MD  prochlorperazine (COMPAZINE) 10 MG tablet TAKE 1 TABLET BY MOUTH EVERY 6 HOURS AS NEEDED FOR NAUSEA OR VOMITING 08/08/20 08/08/21  [name], MD  spironolactone (ALDACTONE) 25 MG tablet Take 0.5 tablets (12.5 mg total) by mouth daily. 04/10/21 04/10/22  Bensimhon, Shaune Pascal, MD  traMADol (ULTRAM) 50 MG tablet Take 1 tablet (50 mg total) by mouth every 6 (six) hours as needed for moderate pain or severe pain. 12/29/20   [name], MD  tucatinib (TUKYSA) 150 MG tablet Take 2 tablets (300 mg total) by mouth 2 (two) times daily. Take every  12 hrs at the same time each day with or without a meal. Take as directed by MD. 05/01/21   [name], MD  Zoster Vaccine Adjuvanted [facility]) injection INJECT 0.5 MLS INTO THE MUSCLE ONCE FOR 1 DOSE. Patient not taking: Reported on 04/10/2021 08/20/20 08/20/21  Autry-Lott, Naaman Plummer, DO  albuterol (VENTOLIN HFA) 108 (90 Base) MCG/ACT inhaler Inhale 1-2 puffs into the lungs every 6 (six) hours as needed for wheezing or shortness of breath. 08/21/20 09/19/20  Garner Nash, DO  budesonide-formoterol (SYMBICORT) 160-4.5 MCG/ACT inhaler Inhale 2 puffs into the lungs 2 (two) times daily. Rinse mouth after each use. 08/21/20 08/29/20  Garner Nash, DO    Physical Exam: Vitals:   05/25/21 1600 05/25/21 1615 05/25/21 1630 05/25/21 1633  BP: (!) 89/69 (!) 89/71 (!) 86/73 91/72  Pulse: 84 81 78 78  Resp: $Remo'12 17 18 'XPYbH$ (!) 22  Temp:      TempSrc:      SpO2: 94% 94% 94% 99%  Weight:      Height:        Constitutional: NAD, calm,  comfortable Vitals:   05/25/21 1600 05/25/21 1615 05/25/21 1630 05/25/21 1633  BP: (!) 89/69 (!) 89/71 (!) 86/73 91/72  Pulse: 84 81 78 78  Resp: $Remo'12 17 18 'lqqsI$ (!) 22  Temp:      TempSrc:      SpO2: 94% 94% 94% 99%  Weight:      Height:       General:  Pleasantly resting in bed, No acute distress.  Somnolent but easily arousable, oriented to person only HEENT:  Normocephalic atraumatic.  Sclerae nonicteric, noninjected.  Extraocular movements intact bilaterally. Neck:  Without mass or deformity.  Trachea is midline. Lungs:  Clear to auscultate bilaterally without rhonchi, wheeze, or rales. Heart:  Regular rate and rhythm.  Without murmurs, rubs, or gallops. Abdomen:  Soft, nontender, nondistended.  Without guarding or rebound. Extremities: Without cyanosis, clubbing, edema, or obvious deformity. Vascular:  Dorsalis pedis and posterior tibial pulses palpable bilaterally. Skin:  Warm and dry, no erythema, no ulcerations.  Labs on Admission: I have personally reviewed following labs and imaging studies  CBC: Recent Labs  Lab 05/22/21 1106 05/25/21 1530  WBC 5.8 5.8  NEUTROABS 4.4 3.5  HGB 11.8* 12.5  HCT 34.1* 37.2  MCV 83.6 84.7  PLT 209 623   Basic Metabolic Panel: Recent Labs  Lab 05/22/21 1106 05/25/21 1530  NA 130* 129*  K 4.9 5.3*  CL 98 98  CO2 21* 23  GLUCOSE 129* 118*  BUN 19 29*  CREATININE 1.67* 2.35*  CALCIUM 10.0 9.8   GFR: Estimated Creatinine Clearance: 20.9 mL/min (A) (by C-G formula based on SCr of 2.35 mg/dL (H)). Liver Function Tests: Recent Labs  Lab 05/22/21 1106 05/25/21 1530  AST 159* 340*  ALT 86* 127*  ALKPHOS 361* 341*  BILITOT 2.5* 1.7*  PROT 7.4 7.4  ALBUMIN 3.6 3.4*   No results for input(s): LIPASE, AMYLASE in the last 168 hours. No results for input(s): AMMONIA in the last 168 hours. Coagulation Profile: Recent Labs  Lab 05/25/21 1530  INR 1.6*   Cardiac Enzymes: No results for input(s): CKTOTAL, CKMB, CKMBINDEX,  TROPONINI in the last 168 hours. BNP (last 3 results) No results for input(s): PROBNP in the last 8760 hours. HbA1C: No results for input(s): HGBA1C in the last 72 hours. CBG: No results for input(s): GLUCAP in the last 168 hours. Lipid Profile: No results for input(s): CHOL, HDL, LDLCALC, TRIG,  CHOLHDL, LDLDIRECT in the last 72 hours. Thyroid Function Tests: No results for input(s): TSH, T4TOTAL, FREET4, T3FREE, THYROIDAB in the last 72 hours. Anemia Panel: No results for input(s): VITAMINB12, FOLATE, FERRITIN, TIBC, IRON, RETICCTPCT in the last 72 hours. Urine analysis:    Component Value Date/Time   COLORURINE COLORLESS (A) 08/08/2020 1055   APPEARANCEUR CLEAR 08/08/2020 1055   LABSPEC 1.002 (L) 08/08/2020 1055   PHURINE 7.0 08/08/2020 1055   GLUCOSEU NEGATIVE 08/08/2020 1055   HGBUR NEGATIVE 08/08/2020 1055   BILIRUBINUR NEGATIVE 08/08/2020 1055   KETONESUR NEGATIVE 08/08/2020 1055   PROTEINUR NEGATIVE 08/08/2020 1055   NITRITE NEGATIVE 08/08/2020 1055   [place] 08/08/2020 1055    Radiological Exams on Admission: CT Head Wo Contrast  Result Date: 05/25/2021 CLINICAL DATA:  Altered mental status. Undergoing chemotherapy for stage IV breast cancer. EXAM: CT HEAD WITHOUT CONTRAST TECHNIQUE: Contiguous axial images were obtained from the base of the skull through the vertex without intravenous contrast. COMPARISON:  Brain MR dated 01/06/2021.  Head CT dated 10/25/2005. FINDINGS: Brain: Normal appearing cerebral hemispheres and posterior fossa structures. Normal size and position of the ventricles. No intracranial hemorrhage, mass lesion or CT evidence of acute infarction. Vascular: No hyperdense vessel or unexpected calcification. Skull: Normal. Negative for fracture or focal lesion. Sinuses/Orbits: No acute finding. Other: None. IMPRESSION: No acute abnormality. Electronically Signed   By: Claudie Revering M.D.   On: 05/25/2021 16:05   DG Chest Port 1 View  Result Date:  05/25/2021 CLINICAL DATA:  Weakness. Undergoing chemotherapy for stage IV breast cancer. EXAM: PORTABLE CHEST 1 VIEW COMPARISON:  01/29/2020. Chest, abdomen and pelvis CT dated 03/24/2021. FINDINGS: Normal sized heart. Clear lungs. Left jugular porta catheter tip in the inferior aspect of the superior vena cava near the superior cavoatrial junction. Mild scoliosis. IMPRESSION: No acute abnormality. Electronically Signed   By: Claudie Revering M.D.   On: 05/25/2021 15:41     [facility] For contact please use secure messenger on Epic  If 7PM-7AM, please contact night-coverage located on [email or web address]   05/25/2021, 5:42 PM

## 2021-05-25 NOTE — Progress Notes (Signed)
Placed order for referral for Palliative Care to [facility] per Dr. Ernestina Penna verbal order.  Faxed referral to [place] Team at [phone]. Receipt of fax confirmed.

## 2021-05-26 ENCOUNTER — Inpatient Hospital Stay (HOSPITAL_COMMUNITY): Payer: Medicare Other

## 2021-05-26 DIAGNOSIS — N179 Acute kidney failure, unspecified: Secondary | ICD-10-CM | POA: Diagnosis not present

## 2021-05-26 DIAGNOSIS — G9340 Encephalopathy, unspecified: Secondary | ICD-10-CM | POA: Diagnosis not present

## 2021-05-26 LAB — URINALYSIS, ROUTINE W REFLEX MICROSCOPIC
Bilirubin Urine: NEGATIVE
Glucose, UA: NEGATIVE mg/dL
Hgb urine dipstick: NEGATIVE
Ketones, ur: 20 mg/dL — AB
Leukocytes,Ua: NEGATIVE
Nitrite: NEGATIVE
Protein, ur: NEGATIVE mg/dL
Specific Gravity, Urine: 1.012 (ref 1.005–1.030)
pH: 5 (ref 5.0–8.0)

## 2021-05-26 LAB — CBC
HCT: 37 % (ref 36.0–46.0)
Hemoglobin: 12.5 g/dL (ref 12.0–15.0)
MCH: 28.5 pg (ref 26.0–34.0)
MCHC: 33.8 g/dL (ref 30.0–36.0)
MCV: 84.3 fL (ref 80.0–100.0)
Platelets: 196 10*3/uL (ref 150–400)
RBC: 4.39 MIL/uL (ref 3.87–5.11)
RDW: 17.8 % — ABNORMAL HIGH (ref 11.5–15.5)
WBC: 8.2 10*3/uL (ref 4.0–10.5)
nRBC: 0.7 % — ABNORMAL HIGH (ref 0.0–0.2)

## 2021-05-26 LAB — AMMONIA
Ammonia: 79 umol/L — ABNORMAL HIGH (ref 9–35)
Ammonia: 55 umol/L — ABNORMAL HIGH (ref 9–35)

## 2021-05-26 LAB — COMPREHENSIVE METABOLIC PANEL
ALT: 159 U/L — ABNORMAL HIGH (ref 0–44)
AST: 548 U/L — ABNORMAL HIGH (ref 15–41)
Albumin: 3.3 g/dL — ABNORMAL LOW (ref 3.5–5.0)
Alkaline Phosphatase: 335 U/L — ABNORMAL HIGH (ref 38–126)
Anion gap: 12 (ref 5–15)
BUN: 25 mg/dL — ABNORMAL HIGH (ref 8–23)
CO2: 18 mmol/L — ABNORMAL LOW (ref 22–32)
Calcium: 9.4 mg/dL (ref 8.9–10.3)
Chloride: 100 mmol/L (ref 98–111)
Creatinine, Ser: 1.49 mg/dL — ABNORMAL HIGH (ref 0.44–1.00)
GFR, Estimated: 39 mL/min — ABNORMAL LOW (ref >60)
Glucose, Bld: 96 mg/dL (ref 70–99)
Potassium: 5.1 mmol/L (ref 3.5–5.1)
Sodium: 130 mmol/L — ABNORMAL LOW (ref 135–145)
Total Bilirubin: 2.8 mg/dL — ABNORMAL HIGH (ref 0.3–1.2)
Total Protein: 6.9 g/dL (ref 6.5–8.1)

## 2021-05-26 LAB — VITAMIN B12: Vitamin B-12: 2540 pg/mL — ABNORMAL HIGH (ref 180–914)

## 2021-05-26 MED ORDER — CHLORHEXIDINE GLUCONATE CLOTH 2 % EX PADS
6.0000 | MEDICATED_PAD | Freq: Every day | CUTANEOUS | Status: DC
  Administered 2021-05-26 – 2021-05-31 (×6): 6 via TOPICAL

## 2021-05-26 MED ORDER — GADOBUTROL 1 MMOL/ML IV SOLN
6.0000 mL | Freq: Once | INTRAVENOUS | Status: AC | PRN
  Administered 2021-05-26: 6 mL via INTRAVENOUS

## 2021-05-26 MED ORDER — THIAMINE HCL 100 MG/ML IJ SOLN
500.0000 mg | Freq: Three times a day (TID) | INTRAVENOUS | Status: AC
  Administered 2021-05-26 – 2021-05-29 (×9): 500 mg via INTRAVENOUS
  Filled 2021-05-26 (×9): qty 5

## 2021-05-26 MED ORDER — LACTULOSE 10 GM/15ML PO SOLN
30.0000 g | Freq: Two times a day (BID) | ORAL | Status: DC
  Filled 2021-05-26: qty 45

## 2021-05-26 MED ORDER — LACTULOSE ENEMA
300.0000 mL | Freq: Once | ORAL | Status: DC | PRN
  Filled 2021-05-26: qty 300

## 2021-05-26 NOTE — Progress Notes (Signed)
HEMATOLOGY-ONCOLOGY PROGRESS NOTE  SUBJECTIVE: Teresa Hays is followed by our office for ER/PR positive, HER2 positive metastatic breast cancer.  Was due to receive treatment last week but this was held due to jaundice, weakness, nausea.  She received IV fluids only on that date.  She was due to follow-up in our symptomatic clinic yesterday and if she had persistent symptoms and ongoing hyperbilirubinemia, plan was for restaging scans and possible MRI of the brain.  She did not come to her appointment yesterday but instead came to the emergency department due to worsening p.o. intake, fatigue, weakness, and change in mental status.  She was noted to be dehydrated in the emergency department.  She also has significant LFT and T bili elevations.  CT head without contrast showed no acute abnormality.  I saw the patient this morning in the emergency department.  No family at the bedside.  History was obtained from the chart.  The patient was very somnolent.  She did not fully answer any questions for me.  He could not tell me why she was brought to the emergency department.  Oncology History Overview Note  Cancer Staging Metastatic breast cancer [facility]) Staging form: Breast, AJCC 8th Edition - Clinical stage from 03/24/2018: Stage IV (cT2, cN1, pM1, G3, ER+, PR+, HER2+) - Signed by [name], MD on 03/30/2018     Metastatic breast cancer ([place])  01/30/2018 Procedure   Colonoscopy showed small polyp in the sigmoid colon, removed, the exam of colon including the terminal ileum was otherwise negative.   01/30/2018 Procedure   EGD by Dr. Collene Mares showed small hiatal hernia, a 8 mm polypoid lesion in the cardia, biopsied.   03/09/2018 Imaging   03/09/2018 US Abdomen IMPRESSION: 1. Mass lesions throughout the liver, consistent with metastatic disease. Liver as a somewhat nodular contour suggesting underlying hepatic cirrhosis. Inhomogeneous echotexture to the liver.   2. Cholelithiasis with mild gallbladder  wall thickening. A degree of cholecystitis cannot be excluded by ultrasound.   3. Portions of pancreas obscured by gas. Visualized portions of pancreas appear normal.   4. Small right kidney. Etiology uncertain. This finding potentially may be indicative of renal artery stenosis. In this regard, question whether patient is hypertensive.   03/15/2018 Imaging   CT CAP with contrast  IMPRESSION: 1. Widespread hepatic metastasis. 2. 2.6 cm lateral right breast soft tissue nodule could represent a breast primary or an incidental benign lesion. Consider correlation with mammogram and ultrasound. 3. No definite source of primary malignancy identified within the abdomen or pelvis. There is possible rectosigmoid junction wall thickening. Consider colonoscopy with attention to this area. 4. Distal esophageal wall thickening, suggesting esophagitis.   03/24/2018 Cancer Staging   Staging form: Breast, AJCC 8th Edition - Clinical stage from 03/24/2018: Stage IV (cT2, cN1, pM1, G3, ER+, PR+, HER2+) - Signed by [name], MD on 03/30/2018   03/24/2018 Initial Biopsy   Diagnosis 1. 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 Breast, right, needle core biopsy, 9 o'clock, 7cm from nipple - INVASIVE DUCTAL CARCINOMA. -The carcinoma is somewhat morphologically dissimilar from that in part 1. It appears grade III 3. Lymph node, needle/core biopsy, right axillary - METASTATIC CARCINOMA IN 1 OF 1 LYMPH NODE (1/1).   03/24/2018 Receptors her2   Breast biopsy: 1. Estrogen Receptor: 40%, POSITIVE, STRONG-MODERATE STAINING INTENSITY Progesterone Receptor: 70%, POSITIVE, STRONG STAINING INTENSITY Proliferation Marker Ki67: 20% HER 2 equivocal by  IHC 2+, POSITIVE by FISH, ratio 2.4 and copy #4.2  2. Estrogen Receptor: 60%, POSITIVE, MODERATE STAINING INTENSITY Progesterone Receptor: 40%, POSITIVE, MODERATE STAINING  INTENSITY Proliferation Marker Ki67: 20% HER2 (+) by IHC 3+   03/24/2018 Initial Diagnosis   Metastatic breast cancer ([place])   03/27/2018 Pathology Results   Diagnosis Liver, needle/core biopsy, Right - METASTATIC CARCINOMA TO LIVER, CONSISTENT WITH PATIENTS CLINICAL HISTORY OF PRIMARY BREAST CARCINOMA.  ER 80%+ PR40%+ HER2- (by [facility], IHC 2+)  Ki67 50%    03/28/2018 Pathology Results   03/28/2018 Surgical Pathology Diagnosis 1. Breast, left, needle core biopsy, 9 o'clock - FIBROCYSTIC CHANGES. - THERE IS NO EVIDENCE OF MALIGNANCY. 2. Breast, left, needle core biopsy, 2 o'clock - FIBROADENOMA. - THERE IS NO EVIDENCE OF MALIGNANCY. - SEE COMMENT.   03/29/2018 Imaging   03/29/2018 Bone Scan IMPRESSION: No scintigraphic evidence of osseous metastatic disease.   03/30/2018 Imaging   Bone scan  IMPRESSION: No scintigraphic evidence of osseous metastatic disease.    04/07/2018 - 07/30/2020 Chemotherapy   First line chemo weekly Taxol and herceptin/Perjeta every 3 weeks starting 04/07/18. She developed infusion reaction to taxol and it was discontinued. Added Abraxane on C1D8, 2 weeks on/1 week off.  Abraxne stopped after 10/13/18 due to worsening Neuropathy. She has continued with maintenance Herceptin injection/Perjeta q3weeks. Herceptin changed to injection on 04/06/19. Both injections switched to combination [place] on 10/01/19. ----Stopped 07/30/20 due to disease progression in liver.    06/06/2018 Imaging   CT CAP IMPRESSION: 1. Generally improved appearance, with reduced axillary adenopathy and reduced enhancing component of the hepatic masses, with some of the hepatic mass is moderately smaller than on the prior exam. Reduced size of the right lateral breast mass compared to the prior 03/15/2018 exam. 2. New mild interstitial accentuation in the lungs, significance uncertain. Part of this appearance may be due to lower lung volumes on today's exam. 3. Mild wall thickening in the  descending colon and upper rectum suggesting low-grade colitis/inflammation. Prominent stool throughout the colon favors constipation. 4. Other imaging findings of potential clinical significance: Aortic Atherosclerosis (ICD10-I70.0). Mild cardiomegaly. Mild nodularity in the right lower lobe appears stable. Contracted and thick-walled gallbladder.     09/18/2018 Imaging   CT CAP W Contrast 09/18/18  IMPRESSION: 1. Liver metastases have decreased in size. 2. Right breast mass, mild right axillary lymphadenopathy and scattered tiny right pulmonary nodules are all stable. 3. New mild left supraclavicular and left subpectoral lymphadenopathy, can not exclude progression of metastatic nodal disease. 4. Moderate colorectal stool volume, which may indicate constipation. 5.  Aortic Atherosclerosis (ICD10-I70.0).   10/2018 - 07/30/2020 Anti-estrogen oral therapy   Letrozole 2.5 mg daily starting 10/2018 D/c to proceed with Inhertu treatment after liver met progression.    01/10/2019 Imaging   CT CAP W Contrast 01/10/19  IMPRESSION: 1. Continued improvement in the hepatic metastatic lesions which have reduced in size. 2. Stable mild left supraclavicular and subpectoral adenopathy. 3. Essentially stable small right lower lobe pulmonary nodule and separate small subpleural nodule along the right hemidiaphragm. Surveillance suggested. 4. Other imaging findings of potential clinical significance: Mild cardiomegaly. Mild circumferential distal esophageal wall thickening, the most common cause would be esophagitis. Airway thickening is present, suggesting bronchitis or reactive airways disease. Airway plugging in the lower lobes and in the right middle lobe. Stable small right adrenal adenoma.   05/15/2019 Imaging   CT CAP W Contrast 05/15/19  IMPRESSION: CT CHEST IMPRESSION   1. Similar appearance of borderline supraclavicular, axillary,  and subpectoral adenopathy. 2. Improved and resolved right  lower lobe pulmonary nodularity. 3. Esophageal air fluid level suggests dysmotility or gastroesophageal reflux.   CT ABDOMEN AND PELVIS IMPRESSION   1. Improved hepatic metastasis. 2. Small bowel mesenteric lymph nodes which are upper normal and mildly enlarged. Likely increased and similar as detailed above. Indeterminate. Recommend attention on follow-up. 3. Cholelithiasis. 4. Motion degradation throughout the lower chest and abdomen.   09/19/2019 Imaging   CT CAP W contrast  IMPRESSION: 1. Interval decrease in size of right axillary and subpectoral lymph nodes. There are no pathologically enlarged lymph nodes remaining in the chest, abdomen, or pelvis. No new lymphadenopathy. 2. No significant change in post treatment appearance of multiple low-attenuation liver lesions, in keeping with treated metastases. 3. No evidence of new metastatic disease in the chest, abdomen, or pelvis. 4. Aortic Atherosclerosis (ICD10-I70.0).   11/16/2019 Imaging   IMRI Brain  MPRESSION: Minimal chronic microvascular ischemic changes. No acute intracranial process.   Active bilateral maxillary sinus disease with mild frontoethmoid mucosal thickening.   01/23/2020 Imaging   CT CAP W contrast  IMPRESSION: 1. Stable exam. No new or progressive interval findings. 2. Stable appearance of upper normal right axillary lymph nodes. Tiny subpectoral and left axillary nodes are unchanged. 3. Generally similar appearance of ill-defined hypoattenuating lesions in the liver compatible with treated metastases. 1 lesion in the central liver is minimally more conspicuous today, likely related to bolus timing and attention on follow-up recommended. No new suspicious liver lesion on today's study. 4. Stable 10 mm right adrenal nodule., indeterminate. Continued attention on follow-up imaging recommended. 5. Cholelithiasis. 6. Aortic Atherosclerosis (ICD10-I70.0).   02/11/2020 Breast MRI   IMPRESSION: 1. 7  millimeter focus of residual enhancement associated with the known malignancy in the 9 o'clock location of the RIGHT breast. 2. No significant enhancement in the known malignancy in the 11:30 o'clock location of the RIGHT breast. 3. Interval resolution of axillary adenopathy.   07/28/2020 Imaging   CT CAP  IMPRESSION: 1. Interval progression of hepatic metastases. 2. Stable indeterminate right adrenal nodule. 3. No new sites of metastatic disease in the chest, abdomen or pelvis. 4. Chronic findings include: Small hiatal hernia. Cholelithiasis. Aortic Atherosclerosis (ICD10-I70.0).     08/08/2020 -  Chemotherapy   Second-line Inhertu q3weeks starting 08/08/20    01/06/2021 Imaging   MRI Brain  IMPRESSION: No evidence of acute intracranial abnormality.   No evidence of intracranial metastatic disease.   Stable noncontrast MRI appearance of the brain as compared to 11/16/2019.   Mild chronic small vessel ischemic changes within the cerebral white matter.   Redemonstrated tiny chronic lacunar infarct within the right cerebellar hemisphere.   Mild paranasal sinus disease, as described   03/24/2021 Imaging   CT CAP  IMPRESSION: 1. Increase in size and visible number of hepatic metastatic lesions compatible with progressive malignancy. 2. Other imaging findings of potential clinical significance: Aortic Atherosclerosis (ICD10-I70.0). Small type 1 hiatal hernia. Prominent stool throughout the colon favors constipation. Trace free pelvic fluid, etiology uncertain. Small right adrenal adenoma.   04/10/2021 -  Chemotherapy   Patient is on Treatment Plan : BREAST Capecitabine + Trastuzumab + Tucatinib q21d        REVIEW OF SYSTEMS:   Unable to obtain   PHYSICAL EXAMINATION: ECOG PERFORMANCE STATUS: 4 - Bedbound  Vitals:   05/26/21 1102 05/26/21 1404  BP: 114/87 (!) 119/96  Pulse: 97 97  Resp: 11 13  Temp:    SpO2: 92% 92%  Filed Weights   05/25/21 1512  Weight:  140 lb (63.5 kg)    Intake/Output from previous day: 11/07 0701 - 11/08 0700 In: 1500 [I.V.:1000; IV Piggyback:500] Out: -   GENERAL: Somnolent, opens eyes, appears comfortable SKIN: skin color, texture, turgor are normal, no rashes or significant lesions EYES: normal, Conjunctiva are pink and non-injected, sclera clear LUNGS: clear to auscultation and percussion with normal breathing effort HEART: regular rate & rhythm and no murmurs and no lower extremity edema ABDOMEN:abdomen soft, non-tender and normal bowel sounds  NEURO: Somnolent, but follows command  LABORATORY DATA:  I have reviewed the data as listed CMP Latest Ref Rng & Units 05/26/2021 05/25/2021 05/22/2021  Glucose 70 - 99 mg/dL 96 118(H) 129(H)  BUN 8 - 23 mg/dL 25(H) 29(H) 19  Creatinine 0.44 - 1.00 mg/dL 1.49(H) 2.35(H) 1.67(H)  Sodium 135 - 145 mmol/L 130(L) 129(L) 130(L)  Potassium 3.5 - 5.1 mmol/L 5.1 5.3(H) 4.9  Chloride 98 - 111 mmol/L 100 98 98  CO2 22 - 32 mmol/L 18(L) 23 21(L)  Calcium 8.9 - 10.3 mg/dL 9.4 9.8 10.0  Total Protein 6.5 - 8.1 g/dL 6.9 7.4 7.4  Total Bilirubin 0.3 - 1.2 mg/dL 2.8(H) 1.7(H) 2.5(H)  Alkaline Phos 38 - 126 U/L 335(H) 341(H) 361(H)  AST 15 - 41 U/L 548(H) 340(H) 159(H)  ALT 0 - 44 U/L 159(H) 127(H) 86(H)    Lab Results  Component Value Date   WBC 8.2 05/26/2021   HGB 12.5 05/26/2021   HCT 37.0 05/26/2021   MCV 84.3 05/26/2021   PLT 196 05/26/2021   NEUTROABS 3.5 05/25/2021    CT Head Wo Contrast  Result Date: 05/25/2021 CLINICAL DATA:  Altered mental status. Undergoing chemotherapy for stage IV breast cancer. EXAM: CT HEAD WITHOUT CONTRAST TECHNIQUE: Contiguous axial images were obtained from the base of the skull through the vertex without intravenous contrast. COMPARISON:  Brain MR dated 01/06/2021.  Head CT dated 10/25/2005. FINDINGS: Brain: Normal appearing cerebral hemispheres and posterior fossa structures. Normal size and position of the ventricles. No intracranial  hemorrhage, mass lesion or CT evidence of acute infarction. Vascular: No hyperdense vessel or unexpected calcification. Skull: Normal. Negative for fracture or focal lesion. Sinuses/Orbits: No acute finding. Other: None. IMPRESSION: No acute abnormality. Electronically Signed   By: Claudie Revering M.D.   On: 05/25/2021 16:05   DG Chest Port 1 View  Result Date: 05/25/2021 CLINICAL DATA:  Weakness. Undergoing chemotherapy for stage IV breast cancer. EXAM: PORTABLE CHEST 1 VIEW COMPARISON:  01/29/2020. Chest, abdomen and pelvis CT dated 03/24/2021. FINDINGS: Normal sized heart. Clear lungs. Left jugular porta catheter tip in the inferior aspect of the superior vena cava near the superior cavoatrial junction. Mild scoliosis. IMPRESSION: No acute abnormality. Electronically Signed   By: Claudie Revering M.D.   On: 05/25/2021 15:41    ASSESSMENT AND PLAN: 1.  ER/PR positive, HER2 positive metastatic breast cancer 2.  Transaminitis/hyperbilirubinemia likely due to worsening liver metastases 3.  Dehydration 4.  Failure to thrive/protein calorie malnutrition 5.  Encephalopathy 6.  History of left upper extremity DVT 7.  Hypertension  -The patient is more somnolent today and has had a continued decline in her performance status.  May not be a candidate for additional systemic treatment at this time.  Also suspect further disease progression in the liver given worsening of liver function. -We will discuss with Dr. Burr Medico need for restaging CT scans/MRI of the brain at this time. -The patient has been referred to palliative  care but it appears that they have not been able to connect with the patient.  Would recommend palliative care consultation in the hospital to assist with goals of care discussion. -Continue IV hydration for now. -Recommend dietitian consultation.   Future Appointments  Date Time Provider [place]  06/05/2021  7:45 AM [facility] None  06/05/2021  8:20 AM [name], MD [facility] None  06/05/2021  9:15 AM [facility] INFUSION [facility] None  07/21/2021  9:00 AM [facility] E[facility] OP 1 [facility]  07/21/2021 10:00 AM Bensimhon, Shaune Pascal, MD [facility] None      LOS: 1 day   [name], DNP, AGPCNP-BC, AOCNP 05/26/21  Addendum  I have seen the patient, examined her. I agree with the assessment and and plan and have edited the notes.   [place] is not doing well, I ordered stat ammonia level this morning, which came back elevated.  This is probably related to her liver dysfunction.  We will start her on lactulose, and hopefully her mental status will improve.  I also ordered and stat brain MRI with and without contrast to rule out a stroke or brain metastasis.  I also spoke with on-call neurologist Dr. Curly Shores, who feels she probably has some antibiotic encephalopathy.  She also recommend checking B12, thiamine level, and start her on thiamine IV after lab draw, which I ordered.   Had a lengthy discussion with her daughter [name] and her niece Donneta Romberg today, they also came to my office this afternoon for more discussion. I shared my concerns that she is overall not doing well, and my concern of cancer progression given the worsening liver function and her mental status.  I will see how things go in the next 24-48 hours, to see if she turns around. I discussed that she will unlikely be a candidate for more cancer treatment, unless she recovers very well.  I also discussed CODE STATUS, I recommend DNR, they will think about it.  I proposed a family meeting tomorrow the rest of her family (pt's siblings). I will f/u tomorrow. I communicated with Dr. Avon Gully closely through [employer] today.   I spent a total of 60 mins today for her care today including care coordination.   [name]  05/26/2021

## 2021-05-26 NOTE — ED Notes (Signed)
Pt initially took eliquis and attempted to open mouth on command for gabapentin, but would not open enough to take medication, instead holding pill between lips. Held other medication, provider aware and changed to NPO status.

## 2021-05-26 NOTE — Progress Notes (Addendum)
PROGRESS NOTE    Teresa Hays  DEY:[id number] DOB: Sep 12, 1955 DOA: 05/25/2021 PCP: [name], DO   Brief Narrative:  Teresa Hays is a 65 y.o. 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 intake mental status fatigue weakness.  Patient follows closely with Dr. Burr Medico oncology, most recent visit just last week for similar episode of worsening p.o. intake malaise and questionable syncope.  Worsening jaundice nausea per family with brought to the ED for further evaluation and treatment.  Assessment & Plan:  Failure to thrive in adult Severe protein caloric malnutrition Weakness with ambulatory dysfunction, increased fall risk - Continue IV fluids, hold home medications in the setting of borderline hypotension - Advance diet as tolerated - Goals of care discussion and treatment discussion for oncology and palliative care in the next 24 hours, concerned she is not tolerating treatment well in the setting of metastatic disease.   Acute metabolic encephalopathy in the setting of above  Mild hyperammonemia, likely POA - Likely secondary to above - Rule out concurrent hepatic encephalopathy, polypharmacy versus worsening metastatic disease - Ammonia minimally elevated at 79 - will treat with PO lactulose (PR if unable to take PO safely). Unclear if this is profound enough to cause current symptoms - will follow after lactulose for improvement - Continue supportive care, if no improvement in the next 24 to 48 hours would likely need to have further discussion about goals of care as below   Metastatic R breast cancer with mets to liver; stage 4 Recent imaging shows progressive disease on  palliative chemo with Dr Burr Medico - Defer to oncology, appreciate insight  and recommendations for ongoing medical care treatment and prognosis discussion   Goals of Care - Lengthy discussion with niece over the phone today about patient's poor prognosis given age and diagnoses as above.  She agrees it certainly reasonable to evaluate for organic cause of mental status changes and deconditioning, oncology ordering imaging to rule out brain metastatic disease and similar. -We discussed that regardless of MRI, if it shows no profound disease then we would presume worsening clinical condition in the setting of advancing cancer, if it does show metastatic disease to the brain this would also be consistent with advancing disease, in either case if the patient in the interim were to aspirate or have a cardiac event or respiratory event we discussed performing CPR or intubating her in the setting of worsening metastatic disease would be ill advised and not improve or treat her current condition and cause more harm and suffering. - Palliative care following in the outpatient setting, appreciate insight recommendations   LUE DVT, chronic - Continue Eliquis 2.5 twice daily   HTN - Currently hypotensive, hold antihypertensive medications, slowly reintroduce as appropriate   Anemia of chronic disease - Chronic anemia around 9-10 - Currently appears to be within normal limits likely hemoconcentration, suspect hemodilution of the next 48 hours   Asthma - Resume home inhaled medications once verified   DVT prophylaxis: Eliquis 2.5 twice daily Code Status: Full code Family Communication: Discussed at length with niece Donneta Romberg about goals of care, prognosis, advancing disease, poor interaction today compared to yesterday and concerned that patient is will going to deteriorate further from this point.  Status is: Inpatient  Dispo: The patient is from: Home  Anticipated d/c is to: To be determined              Anticipated d/c date is: 48 to 72 hours               Patient currently not medically stable for discharge  Consultants:  Oncology, palliative care  Procedures:  None  Antimicrobials:  None indicated  Subjective: Patient clinically worsening, no longer verbally responsive or interactive this morning, ROS limited  Objective: Vitals:   05/26/21 0415 05/26/21 0430 05/26/21 0445 05/26/21 0500  BP:  115/88  111/86  Pulse:    93  Resp: 12 11 11 11   Temp:      TempSrc:      SpO2:    98%  Weight:      Height:        Intake/Output Summary (Last 24 hours) at 05/26/2021 0721 Last data filed at 05/26/2021 0543 Gross per 24 hour  Intake 1500 ml  Output --  Net 1500 ml   Filed Weights   05/25/21 1512  Weight: 63.5 kg    Examination:  General:  Pleasantly resting in bed, No acute distress.,  Minimally responsive, not following commands, protecting airway otherwise appears awake and alert HEENT:  Normocephalic atraumatic.  Sclerae nonicteric, noninjected.  Extraocular movements intact bilaterally. Neck:  Without mass or deformity.  Trachea is midline. Lungs:  Clear to auscultate bilaterally without rhonchi, wheeze, or rales. Heart:  Regular rate and rhythm.  Without murmurs, rubs, or gallops. Abdomen:  Soft, nontender, nondistended.  Without guarding or rebound. Extremities: Without cyanosis, clubbing, edema, or obvious deformity. Vascular:  Dorsalis pedis and posterior tibial pulses palpable bilaterally. Skin:  Warm and dry, no erythema, no ulcerations.  Data Reviewed: I have personally reviewed following labs and imaging studies  CBC: Recent Labs  Lab 05/22/21 1106 05/25/21 1530 05/26/21 0514  WBC 5.8 5.8 8.2  NEUTROABS 4.4 3.5  --   HGB 11.8* 12.5 12.5  HCT 34.1* 37.2 37.0  MCV 83.6 84.7 84.3  PLT 209 225 676   Basic Metabolic Panel: Recent Labs  Lab 05/22/21 1106 05/25/21 1530 05/26/21 0514  NA 130* 129* 130*  K 4.9 5.3* 5.1  CL 98 98 100  CO2 21* 23 18*  GLUCOSE 129* 118* 96  BUN 19 29* 25*  CREATININE  1.67* 2.35* 1.49*  CALCIUM 10.0 9.8 9.4   GFR: Estimated Creatinine Clearance: 32.9 mL/min (A) (by C-G formula based on SCr of 1.49 mg/dL (H)). Liver Function Tests: Recent Labs  Lab 05/22/21 1106 05/25/21 1530 05/26/21 0514  AST 159* 340* 548*  ALT 86* 127* 159*  ALKPHOS 361* 341* 335*  BILITOT 2.5* 1.7* 2.8*  PROT 7.4 7.4 6.9  ALBUMIN 3.6 3.4* 3.3*   No results for input(s): LIPASE, AMYLASE in the last 168 hours. No results for input(s): AMMONIA in the last 168 hours. Coagulation Profile: Recent Labs  Lab 05/25/21 1530  INR 1.6*   Cardiac Enzymes: No results for input(s): CKTOTAL, CKMB, CKMBINDEX, TROPONINI in the last 168 hours. BNP (last 3 results) No results for input(s): PROBNP in the last 8760 hours. HbA1C: No results for input(s): HGBA1C in the last 72 hours. CBG: No results for input(s): GLUCAP in the last 168 hours. Lipid Profile: No results for input(s): CHOL, HDL, LDLCALC, TRIG, CHOLHDL, LDLDIRECT in the last 72 hours. Thyroid Function Tests: No results for input(s): TSH, T4TOTAL, FREET4, T3FREE, THYROIDAB in the last 72 hours. Anemia Panel: No results for input(s): VITAMINB12, FOLATE, FERRITIN, TIBC, IRON,  RETICCTPCT in the last 72 hours. Sepsis Labs: Recent Labs  Lab 05/25/21 1530  LATICACIDVEN 1.5    Recent Results (from the past 240 hour(s))  Resp Panel by RT-PCR (Flu A&B, Covid) Nasopharyngeal Swab     Status: None   Collection Time: 05/25/21  3:30 PM   Specimen: Nasopharyngeal Swab; Nasopharyngeal(NP) swabs in vial transport medium  Result Value Ref Range Status   SARS Coronavirus 2 by RT PCR NEGATIVE NEGATIVE Final    Comment: (NOTE) SARS-CoV-2 target nucleic acids are NOT DETECTED.  The SARS-CoV-2 RNA is generally detectable in upper respiratory specimens during the acute phase of infection. The lowest concentration of SARS-CoV-2 viral copies this assay can detect is 138 copies/mL. A negative result does not preclude SARS-Cov-2 infection  and should not be used as the sole basis for treatment or other patient management decisions. A negative result may occur with  improper specimen collection/handling, submission of specimen other than nasopharyngeal swab, presence of viral mutation(s) within the areas targeted by this assay, and inadequate number of viral copies(<138 copies/mL). A negative result must be combined with clinical observations, patient history, and epidemiological information. The expected result is Negative.  Fact Sheet for Patients:  EntrepreneurPulse.com.au  Fact Sheet for Healthcare Providers:  IncredibleEmployment.be  This test is no t yet approved or cleared by the [place] FDA and  has been authorized for detection and/or diagnosis of SARS-CoV-2 by FDA under an Emergency Use Authorization (EUA). This EUA will remain  in effect (meaning this test can be used) for the duration of the COVID-19 declaration under Section 564(b)(1) of the Act, 21 U.S.C.section 360bbb-3(b)(1), unless the authorization is terminated  or revoked sooner.       Influenza A by PCR NEGATIVE NEGATIVE Final   Influenza B by PCR NEGATIVE NEGATIVE Final    Comment: (NOTE) The Xpert Xpress SARS-CoV-2/FLU/RSV plus assay is intended as an aid in the diagnosis of influenza from Nasopharyngeal swab specimens and should not be used as a sole basis for treatment. Nasal washings and aspirates are unacceptable for Xpert Xpress SARS-CoV-2/FLU/RSV testing.  Fact Sheet for Patients: EntrepreneurPulse.com.au  Fact Sheet for Healthcare Providers: IncredibleEmployment.be  This test is not yet approved or cleared by the [place] FDA and has been authorized for detection and/or diagnosis of SARS-CoV-2 by FDA under an Emergency Use Authorization (EUA). This EUA will remain in effect (meaning this test can be used) for the duration of the COVID-19 declaration  under Section 564(b)(1) of the Act, 21 U.S.C. section 360bbb-3(b)(1), unless the authorization is terminated or revoked.  Performed at [facility], [place] [address], [place], [place] [address]          Radiology Studies: CT Head Wo Contrast  Result Date: 05/25/2021 CLINICAL DATA:  Altered mental status. Undergoing chemotherapy for stage IV breast cancer. EXAM: CT HEAD WITHOUT CONTRAST TECHNIQUE: Contiguous axial images were obtained from the base of the skull through the vertex without intravenous contrast. COMPARISON:  Brain MR dated 01/06/2021.  Head CT dated 10/25/2005. FINDINGS: Brain: Normal appearing cerebral hemispheres and posterior fossa structures. Normal size and position of the ventricles. No intracranial hemorrhage, mass lesion or CT evidence of acute infarction. Vascular: No hyperdense vessel or unexpected calcification. Skull: Normal. Negative for fracture or focal lesion. Sinuses/Orbits: No acute finding. Other: None. IMPRESSION: No acute abnormality. Electronically Signed   By: Claudie Revering M.D.   On: 05/25/2021 16:05   DG Chest Port 1 View  Result Date: 05/25/2021 CLINICAL DATA:  Weakness. Undergoing chemotherapy for stage IV breast cancer. EXAM: PORTABLE CHEST 1 VIEW COMPARISON:  01/29/2020. Chest, abdomen and pelvis CT dated 03/24/2021. FINDINGS: Normal sized heart. Clear lungs. Left jugular porta catheter tip in the inferior aspect of the superior vena cava near the superior cavoatrial junction. Mild scoliosis. IMPRESSION: No acute abnormality. Electronically Signed   By: Claudie Revering M.D.   On: 05/25/2021 15:41    Scheduled Meds:  apixaban  2.5 mg Oral BID   DULoxetine  20 mg Oral Daily   gabapentin  300 mg Oral TID   montelukast  10 mg Oral QHS   Continuous Infusions:  sodium chloride Stopped (05/25/21 1740)   sodium chloride Stopped (05/26/21 0543)     LOS: 1 day   Time spent: 55min  Garry Nicolini C Ras Kollman, DO [facility]  If  7PM-7AM, please contact night-coverage [email or web address]  05/26/2021, 7:21 AM

## 2021-05-26 NOTE — ED Notes (Signed)
Patient transported to MRI 

## 2021-05-26 NOTE — Progress Notes (Signed)
   05/26/21 2235  Notify: Provider  Provider Name/Title Lavon Paganini NP  Date Provider Notified 05/26/21  Time Provider Notified 2236  Notification Type  (amion)  Notification Reason Other (Comment) (Pt npo unable to take eliquis or other meds)  Provider response No new orders  Date of Provider Response 05/26/21  Time of Provider Response 2236

## 2021-05-26 NOTE — Progress Notes (Signed)
HEMATOLOGY-ONCOLOGY PROGRESS NOTE  SUBJECTIVE: Teresa Hays is followed by our office for ER/PR positive, HER2 positive metastatic breast cancer.  Was due to receive treatment last week but this was held due to jaundice, weakness, nausea.  She received IV fluids only on that date.  She was due to follow-up in our symptomatic clinic yesterday and if she had persistent symptoms and ongoing hyperbilirubinemia, plan was for restaging scans and possible MRI of the brain.  She did not come to her appointment yesterday but instead came to the emergency department due to worsening p.o. intake, fatigue, weakness, and change in mental status.  She was noted to be dehydrated in the emergency department.  She also has significant LFT and T bili elevations.  CT head without contrast showed no acute abnormality.  I saw the patient this morning in the emergency department.  No family at the bedside.  History was obtained from the chart.  The patient was very somnolent.  She did not fully answer any questions for me.  He could not tell me why she was brought to the emergency department.  Oncology History Overview Note  Cancer Staging Metastatic breast cancer [facility]) Staging form: Breast, AJCC 8th Edition - Clinical stage from 03/24/2018: Stage IV (cT2, cN1, pM1, G3, ER+, PR+, HER2+) - Signed by [name], MD on 03/30/2018     Metastatic breast cancer ([place])  01/30/2018 Procedure   Colonoscopy showed small polyp in the sigmoid colon, removed, the exam of colon including the terminal ileum was otherwise negative.   01/30/2018 Procedure   EGD by Dr. Collene Mares showed small hiatal hernia, a 8 mm polypoid lesion in the cardia, biopsied.   03/09/2018 Imaging   03/09/2018 US Abdomen IMPRESSION: 1. Mass lesions throughout the liver, consistent with metastatic disease. Liver as a somewhat nodular contour suggesting underlying hepatic cirrhosis. Inhomogeneous echotexture to the liver.   2. Cholelithiasis with mild gallbladder  wall thickening. A degree of cholecystitis cannot be excluded by ultrasound.   3. Portions of pancreas obscured by gas. Visualized portions of pancreas appear normal.   4. Small right kidney. Etiology uncertain. This finding potentially may be indicative of renal artery stenosis. In this regard, question whether patient is hypertensive.   03/15/2018 Imaging   CT CAP with contrast  IMPRESSION: 1. Widespread hepatic metastasis. 2. 2.6 cm lateral right breast soft tissue nodule could represent a breast primary or an incidental benign lesion. Consider correlation with mammogram and ultrasound. 3. No definite source of primary malignancy identified within the abdomen or pelvis. There is possible rectosigmoid junction wall thickening. Consider colonoscopy with attention to this area. 4. Distal esophageal wall thickening, suggesting esophagitis.   03/24/2018 Cancer Staging   Staging form: Breast, AJCC 8th Edition - Clinical stage from 03/24/2018: Stage IV (cT2, cN1, pM1, G3, ER+, PR+, HER2+) - Signed by [name], MD on 03/30/2018    03/24/2018 Initial Biopsy   Diagnosis 1. 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 Breast, right, needle core biopsy, 9 o'clock, 7cm from nipple - INVASIVE DUCTAL CARCINOMA. -The carcinoma is somewhat morphologically dissimilar from that in part 1. It appears grade III 3. Lymph node, needle/core biopsy, right axillary - METASTATIC CARCINOMA IN 1 OF 1 LYMPH NODE (1/1).   03/24/2018 Receptors her2   Breast biopsy: 1. Estrogen Receptor: 40%, POSITIVE, STRONG-MODERATE STAINING INTENSITY Progesterone Receptor: 70%, POSITIVE, STRONG STAINING INTENSITY Proliferation Marker Ki67: 20% HER 2 equivocal  by IHC 2+, POSITIVE by FISH, ratio 2.4 and copy #4.2  2. Estrogen Receptor: 60%, POSITIVE, MODERATE STAINING INTENSITY Progesterone Receptor: 40%, POSITIVE, MODERATE STAINING  INTENSITY Proliferation Marker Ki67: 20% HER2 (+) by IHC 3+   03/24/2018 Initial Diagnosis   Metastatic breast cancer ([place])   03/27/2018 Pathology Results   Diagnosis Liver, needle/core biopsy, Right - METASTATIC CARCINOMA TO LIVER, CONSISTENT WITH PATIENTS CLINICAL HISTORY OF PRIMARY BREAST CARCINOMA.  ER 80%+ PR40%+ HER2- (by [facility], IHC 2+)  Ki67 50%    03/28/2018 Pathology Results   03/28/2018 Surgical Pathology Diagnosis 1. Breast, left, needle core biopsy, 9 o'clock - FIBROCYSTIC CHANGES. - THERE IS NO EVIDENCE OF MALIGNANCY. 2. Breast, left, needle core biopsy, 2 o'clock - FIBROADENOMA. - THERE IS NO EVIDENCE OF MALIGNANCY. - SEE COMMENT.   03/29/2018 Imaging   03/29/2018 Bone Scan IMPRESSION: No scintigraphic evidence of osseous metastatic disease.   03/30/2018 Imaging   Bone scan  IMPRESSION: No scintigraphic evidence of osseous metastatic disease.    04/07/2018 - 07/30/2020 Chemotherapy   First line chemo weekly Taxol and herceptin/Perjeta every 3 weeks starting 04/07/18. She developed infusion reaction to taxol and it was discontinued. Added Abraxane on C1D8, 2 weeks on/1 week off.  Abraxne stopped after 10/13/18 due to worsening Neuropathy. She has continued with maintenance Herceptin injection/Perjeta q3weeks. Herceptin changed to injection on 04/06/19. Both injections switched to combination Phesgo on 10/01/19. ----Stopped 07/30/20 due to disease progression in liver.    06/06/2018 Imaging   CT CAP IMPRESSION: 1. Generally improved appearance, with reduced axillary adenopathy and reduced enhancing component of the hepatic masses, with some of the hepatic mass is moderately smaller than on the prior exam. Reduced size of the right lateral breast mass compared to the prior 03/15/2018 exam. 2. New mild interstitial accentuation in the lungs, significance uncertain. Part of this appearance may be due to lower lung volumes on today's exam. 3. Mild wall thickening in the  descending colon and upper rectum suggesting low-grade colitis/inflammation. Prominent stool throughout the colon favors constipation. 4. Other imaging findings of potential clinical significance: Aortic Atherosclerosis (ICD10-I70.0). Mild cardiomegaly. Mild nodularity in the right lower lobe appears stable. Contracted and thick-walled gallbladder.     09/18/2018 Imaging   CT CAP W Contrast 09/18/18  IMPRESSION: 1. Liver metastases have decreased in size. 2. Right breast mass, mild right axillary lymphadenopathy and scattered tiny right pulmonary nodules are all stable. 3. New mild left supraclavicular and left subpectoral lymphadenopathy, can not exclude progression of metastatic nodal disease. 4. Moderate colorectal stool volume, which may indicate constipation. 5.  Aortic Atherosclerosis (ICD10-I70.0).   10/2018 - 07/30/2020 Anti-estrogen oral therapy   Letrozole 2.5 mg daily starting 10/2018 D/c to proceed with Inhertu treatment after liver met progression.    01/10/2019 Imaging   CT CAP W Contrast 01/10/19  IMPRESSION: 1. Continued improvement in the hepatic metastatic lesions which have reduced in size. 2. Stable mild left supraclavicular and subpectoral adenopathy. 3. Essentially stable small right lower lobe pulmonary nodule and separate small subpleural nodule along the right hemidiaphragm. Surveillance suggested. 4. Other imaging findings of potential clinical significance: Mild cardiomegaly. Mild circumferential distal esophageal wall thickening, the most common cause would be esophagitis. Airway thickening is present, suggesting bronchitis or reactive airways disease. Airway plugging in the lower lobes and in the right middle lobe. Stable small right adrenal adenoma.   05/15/2019 Imaging   CT CAP W Contrast 05/15/19  IMPRESSION: CT CHEST IMPRESSION   1. Similar appearance of borderline supraclavicular,  axillary, and subpectoral adenopathy. 2. Improved and resolved right  lower lobe pulmonary nodularity. 3. Esophageal air fluid level suggests dysmotility or gastroesophageal reflux.   CT ABDOMEN AND PELVIS IMPRESSION   1. Improved hepatic metastasis. 2. Small bowel mesenteric lymph nodes which are upper normal and mildly enlarged. Likely increased and similar as detailed above. Indeterminate. Recommend attention on follow-up. 3. Cholelithiasis. 4. Motion degradation throughout the lower chest and abdomen.   09/19/2019 Imaging   CT CAP W contrast  IMPRESSION: 1. Interval decrease in size of right axillary and subpectoral lymph nodes. There are no pathologically enlarged lymph nodes remaining in the chest, abdomen, or pelvis. No new lymphadenopathy. 2. No significant change in post treatment appearance of multiple low-attenuation liver lesions, in keeping with treated metastases. 3. No evidence of new metastatic disease in the chest, abdomen, or pelvis. 4. Aortic Atherosclerosis (ICD10-I70.0).   11/16/2019 Imaging   IMRI Brain  MPRESSION: Minimal chronic microvascular ischemic changes. No acute intracranial process.   Active bilateral maxillary sinus disease with mild frontoethmoid mucosal thickening.   01/23/2020 Imaging   CT CAP W contrast  IMPRESSION: 1. Stable exam. No new or progressive interval findings. 2. Stable appearance of upper normal right axillary lymph nodes. Tiny subpectoral and left axillary nodes are unchanged. 3. Generally similar appearance of ill-defined hypoattenuating lesions in the liver compatible with treated metastases. 1 lesion in the central liver is minimally more conspicuous today, likely related to bolus timing and attention on follow-up recommended. No new suspicious liver lesion on today's study. 4. Stable 10 mm right adrenal nodule., indeterminate. Continued attention on follow-up imaging recommended. 5. Cholelithiasis. 6. Aortic Atherosclerosis (ICD10-I70.0).   02/11/2020 Breast MRI   IMPRESSION: 1. 7  millimeter focus of residual enhancement associated with the known malignancy in the 9 o'clock location of the RIGHT breast. 2. No significant enhancement in the known malignancy in the 11:30 o'clock location of the RIGHT breast. 3. Interval resolution of axillary adenopathy.   07/28/2020 Imaging   CT CAP  IMPRESSION: 1. Interval progression of hepatic metastases. 2. Stable indeterminate right adrenal nodule. 3. No new sites of metastatic disease in the chest, abdomen or pelvis. 4. Chronic findings include: Small hiatal hernia. Cholelithiasis. Aortic Atherosclerosis (ICD10-I70.0).     08/08/2020 -  Chemotherapy   Second-line Inhertu q3weeks starting 08/08/20    01/06/2021 Imaging   MRI Brain  IMPRESSION: No evidence of acute intracranial abnormality.   No evidence of intracranial metastatic disease.   Stable noncontrast MRI appearance of the brain as compared to 11/16/2019.   Mild chronic small vessel ischemic changes within the cerebral white matter.   Redemonstrated tiny chronic lacunar infarct within the right cerebellar hemisphere.   Mild paranasal sinus disease, as described   03/24/2021 Imaging   CT CAP  IMPRESSION: 1. Increase in size and visible number of hepatic metastatic lesions compatible with progressive malignancy. 2. Other imaging findings of potential clinical significance: Aortic Atherosclerosis (ICD10-I70.0). Small type 1 hiatal hernia. Prominent stool throughout the colon favors constipation. Trace free pelvic fluid, etiology uncertain. Small right adrenal adenoma.   04/10/2021 -  Chemotherapy   Patient is on Treatment Plan : BREAST Capecitabine + Trastuzumab + Tucatinib q21d        REVIEW OF SYSTEMS:   Unable to obtain   PHYSICAL EXAMINATION: ECOG PERFORMANCE STATUS: 4 - Bedbound  Vitals:   05/26/21 0500 05/26/21 0801  BP: 111/86 113/85  Pulse: 93 96  Resp: 11 (!) 21  Temp:    SpO2: 98%  94%   Filed Weights   05/25/21 1512  Weight:  63.5 kg    Intake/Output from previous day: 11/07 0701 - 11/08 0700 In: 1500 [I.V.:1000; IV Piggyback:500] Out: -   GENERAL: Somnolent, opens eyes, appears comfortable SKIN: skin color, texture, turgor are normal, no rashes or significant lesions EYES: normal, Conjunctiva are pink and non-injected, sclera clear LUNGS: clear to auscultation and percussion with normal breathing effort HEART: regular rate & rhythm and no murmurs and no lower extremity edema ABDOMEN:abdomen soft, non-tender and normal bowel sounds  NEURO: Somnolent, but follows command  LABORATORY DATA:  I have reviewed the data as listed CMP Latest Ref Rng & Units 05/26/2021 05/25/2021 05/22/2021  Glucose 70 - 99 mg/dL 96 118(H) 129(H)  BUN 8 - 23 mg/dL 25(H) 29(H) 19  Creatinine 0.44 - 1.00 mg/dL 1.49(H) 2.35(H) 1.67(H)  Sodium 135 - 145 mmol/L 130(L) 129(L) 130(L)  Potassium 3.5 - 5.1 mmol/L 5.1 5.3(H) 4.9  Chloride 98 - 111 mmol/L 100 98 98  CO2 22 - 32 mmol/L 18(L) 23 21(L)  Calcium 8.9 - 10.3 mg/dL 9.4 9.8 10.0  Total Protein 6.5 - 8.1 g/dL 6.9 7.4 7.4  Total Bilirubin 0.3 - 1.2 mg/dL 2.8(H) 1.7(H) 2.5(H)  Alkaline Phos 38 - 126 U/L 335(H) 341(H) 361(H)  AST 15 - 41 U/L 548(H) 340(H) 159(H)  ALT 0 - 44 U/L 159(H) 127(H) 86(H)    Lab Results  Component Value Date   WBC 8.2 05/26/2021   HGB 12.5 05/26/2021   HCT 37.0 05/26/2021   MCV 84.3 05/26/2021   PLT 196 05/26/2021   NEUTROABS 3.5 05/25/2021    CT Head Wo Contrast  Result Date: 05/25/2021 CLINICAL DATA:  Altered mental status. Undergoing chemotherapy for stage IV breast cancer. EXAM: CT HEAD WITHOUT CONTRAST TECHNIQUE: Contiguous axial images were obtained from the base of the skull through the vertex without intravenous contrast. COMPARISON:  Brain MR dated 01/06/2021.  Head CT dated 10/25/2005. FINDINGS: Brain: Normal appearing cerebral hemispheres and posterior fossa structures. Normal size and position of the ventricles. No intracranial hemorrhage,  mass lesion or CT evidence of acute infarction. Vascular: No hyperdense vessel or unexpected calcification. Skull: Normal. Negative for fracture or focal lesion. Sinuses/Orbits: No acute finding. Other: None. IMPRESSION: No acute abnormality. Electronically Signed   By: Claudie Revering M.D.   On: 05/25/2021 16:05   DG Chest Port 1 View  Result Date: 05/25/2021 CLINICAL DATA:  Weakness. Undergoing chemotherapy for stage IV breast cancer. EXAM: PORTABLE CHEST 1 VIEW COMPARISON:  01/29/2020. Chest, abdomen and pelvis CT dated 03/24/2021. FINDINGS: Normal sized heart. Clear lungs. Left jugular porta catheter tip in the inferior aspect of the superior vena cava near the superior cavoatrial junction. Mild scoliosis. IMPRESSION: No acute abnormality. Electronically Signed   By: Claudie Revering M.D.   On: 05/25/2021 15:41    ASSESSMENT AND PLAN: 1.  ER/PR positive, HER2 positive metastatic breast cancer 2.  Transaminitis/hyperbilirubinemia likely due to worsening liver metastases 3.  Dehydration 4.  Failure to thrive/protein calorie malnutrition 5.  Encephalopathy 6.  History of left upper extremity DVT 7.  Hypertension  -The patient is more somnolent today and has had a continued decline in her performance status.  May not be a candidate for additional systemic treatment at this time.  Also suspect further disease progression in the liver given worsening of liver function. -We will discuss with Dr. Burr Medico need for restaging CT scans/MRI of the brain at this time. -The patient has been referred to  palliative care but it appears that they have not been able to connect with the patient.  Would recommend palliative care consultation in the hospital to assist with goals of care discussion. -Continue IV hydration for now. -Recommend dietitian consultation.   Future Appointments  Date Time Provider [place]  06/05/2021  7:45 AM [facility] [place] None  06/05/2021  8:20 AM [name], MD  [facility] None  06/05/2021  9:15 AM [facility] INFUSION [facility] None  07/21/2021  9:00 AM [facility] OP 1 [facility]  07/21/2021 10:00 AM Bensimhon, Shaune Pascal, MD [facility] None      LOS: 1 day   Mikey Bussing, DNP, AGPCNP-BC, AOCNP 05/26/21

## 2021-05-27 DIAGNOSIS — G9340 Encephalopathy, unspecified: Secondary | ICD-10-CM | POA: Diagnosis not present

## 2021-05-27 DIAGNOSIS — C50919 Malignant neoplasm of unspecified site of unspecified female breast: Secondary | ICD-10-CM | POA: Diagnosis not present

## 2021-05-27 DIAGNOSIS — G9341 Metabolic encephalopathy: Secondary | ICD-10-CM | POA: Diagnosis not present

## 2021-05-27 LAB — CBC
HCT: 36 % (ref 36.0–46.0)
Hemoglobin: 12 g/dL (ref 12.0–15.0)
MCH: 28.6 pg (ref 26.0–34.0)
MCHC: 33.3 g/dL (ref 30.0–36.0)
MCV: 85.9 fL (ref 80.0–100.0)
Platelets: 183 10*3/uL (ref 150–400)
RBC: 4.19 MIL/uL (ref 3.87–5.11)
RDW: 17.8 % — ABNORMAL HIGH (ref 11.5–15.5)
WBC: 8.2 10*3/uL (ref 4.0–10.5)
nRBC: 0.5 % — ABNORMAL HIGH (ref 0.0–0.2)

## 2021-05-27 LAB — COMPREHENSIVE METABOLIC PANEL
ALT: 177 U/L — ABNORMAL HIGH (ref 0–44)
AST: 462 U/L — ABNORMAL HIGH (ref 15–41)
Albumin: 3.1 g/dL — ABNORMAL LOW (ref 3.5–5.0)
Alkaline Phosphatase: 295 U/L — ABNORMAL HIGH (ref 38–126)
Anion gap: 9 (ref 5–15)
BUN: 16 mg/dL (ref 8–23)
CO2: 20 mmol/L — ABNORMAL LOW (ref 22–32)
Calcium: 8.9 mg/dL (ref 8.9–10.3)
Chloride: 102 mmol/L (ref 98–111)
Creatinine, Ser: 1.07 mg/dL — ABNORMAL HIGH (ref 0.44–1.00)
GFR, Estimated: 58 mL/min — ABNORMAL LOW (ref >60)
Glucose, Bld: 116 mg/dL — ABNORMAL HIGH (ref 70–99)
Potassium: 4.4 mmol/L (ref 3.5–5.1)
Sodium: 131 mmol/L — ABNORMAL LOW (ref 135–145)
Total Bilirubin: 2.8 mg/dL — ABNORMAL HIGH (ref 0.3–1.2)
Total Protein: 6.8 g/dL (ref 6.5–8.1)

## 2021-05-27 LAB — GLUCOSE, CAPILLARY: Glucose-Capillary: 82 mg/dL (ref 70–99)

## 2021-05-27 NOTE — Progress Notes (Signed)
HEMATOLOGY-ONCOLOGY PROGRESS NOTE  SUBJECTIVE: Teresa Hays remains to be lethargic, and only oriented to person, and not able to answer most of the questions. She is NPO now due to her AMS and not swallowing.    REVIEW OF SYSTEMS:   Unable to obtain   PHYSICAL EXAMINATION: ECOG PERFORMANCE STATUS: 4 - Bedbound  Vitals:   05/27/21 1415 05/27/21 2046  BP: 108/88 112/83  Pulse: 98 93  Resp: 16   Temp: 99.2 F (37.3 C) 98 F (36.7 C)  SpO2: 100% 99%   Filed Weights   05/25/21 1512  Weight: 140 lb (63.5 kg)    Intake/Output from previous day: 11/08 0701 - 11/09 0700 In: 2014.5 [I.V.:1914.5; IV Piggyback:100] Out: 1000 [Urine:1000]  GENERAL: Somnolent, opens eyes, appears comfortable SKIN: skin color, texture, turgor are normal, no rashes or significant lesions EYES: normal, Conjunctiva are pink and non-injected, sclera clear LUNGS: clear to auscultation and percussion with normal breathing effort HEART: regular rate & rhythm and no murmurs and no lower extremity edema ABDOMEN:abdomen soft, non-tender and normal bowel sounds  NEURO: Somnolent, but follows command  LABORATORY DATA:  I have reviewed the data as listed CMP Latest Ref Rng & Units 05/27/2021 05/26/2021 05/25/2021  Glucose 70 - 99 mg/dL 116(H) 96 118(H)  BUN 8 - 23 mg/dL 16 25(H) 29(H)  Creatinine 0.44 - 1.00 mg/dL 1.07(H) 1.49(H) 2.35(H)  Sodium 135 - 145 mmol/L 131(L) 130(L) 129(L)  Potassium 3.5 - 5.1 mmol/L 4.4 5.1 5.3(H)  Chloride 98 - 111 mmol/L 102 100 98  CO2 22 - 32 mmol/L 20(L) 18(L) 23  Calcium 8.9 - 10.3 mg/dL 8.9 9.4 9.8  Total Protein 6.5 - 8.1 g/dL 6.8 6.9 7.4  Total Bilirubin 0.3 - 1.2 mg/dL 2.8(H) 2.8(H) 1.7(H)  Alkaline Phos 38 - 126 U/L 295(H) 335(H) 341(H)  AST 15 - 41 U/L 462(H) 548(H) 340(H)  ALT 0 - 44 U/L 177(H) 159(H) 127(H)    Lab Results  Component Value Date   WBC 8.2 05/27/2021   HGB 12.0 05/27/2021   HCT 36.0 05/27/2021   MCV 85.9 05/27/2021   PLT 183 05/27/2021   NEUTROABS  3.5 05/25/2021    CT Head Wo Contrast  Result Date: 05/25/2021 CLINICAL DATA:  Altered mental status. Undergoing chemotherapy for stage IV breast cancer. EXAM: CT HEAD WITHOUT CONTRAST TECHNIQUE: Contiguous axial images were obtained from the base of the skull through the vertex without intravenous contrast. COMPARISON:  Brain MR dated 01/06/2021.  Head CT dated 10/25/2005. FINDINGS: Brain: Normal appearing cerebral hemispheres and posterior fossa structures. Normal size and position of the ventricles. No intracranial hemorrhage, mass lesion or CT evidence of acute infarction. Vascular: No hyperdense vessel or unexpected calcification. Skull: Normal. Negative for fracture or focal lesion. Sinuses/Orbits: No acute finding. Other: None. IMPRESSION: No acute abnormality. Electronically Signed   By: Claudie Revering M.D.   On: 05/25/2021 16:05   MR BRAIN W WO CONTRAST  Result Date: 05/26/2021 CLINICAL DATA:  Mental status change, persistent or worsening. Additional history provided: Metastatic breast cancer, recent mental status change, evaluate for possible stroke versus metastases. EXAM: MRI HEAD WITHOUT AND WITH CONTRAST TECHNIQUE: Multiplanar, multiecho pulse sequences of the brain and surrounding structures were obtained without and with intravenous contrast. CONTRAST:  78m GADAVIST GADOBUTROL 1 MMOL/ML IV SOLN COMPARISON:  Prior head CT examinations 05/25/2021 and earlier. Brain MRI 01/06/2021. FINDINGS: Brain: Mild intermittent motion degradation. Cerebral volume is within normal limits for age. Mild multifocal T2 FLAIR hyperintense signal abnormality within the cerebral white  matter, nonspecific but compatible with chronic small vessel ischemic disease. Redemonstrated tiny chronic lacunar infarct within the right cerebellar hemisphere (series 8, image 7). There is no acute infarct. No evidence of an intracranial mass. No chronic intracranial blood products. No extra-axial fluid collection. No midline shift.  Incidentally noted cavum septum pellucidum and cavum vergae. No pathologic intracranial enhancement is identified to suggest intracranial metastatic disease. Vascular: Maintained flow voids within the proximal large arterial vessels. Skull and upper cervical spine: No focal suspicious marrow lesion. Incompletely assessed cervical spondylosis. Sinuses/Orbits: Visualized orbits show no acute finding. Minimal mucosal thickening within the left ethmoid air cells. IMPRESSION: Mildly motion degraded exam. No evidence of acute intracranial abnormality. No evidence of intracranial metastatic disease. Mild chronic small vessel ischemic changes within the cerebral white matter, stable. Redemonstrated tiny chronic lacunar infarct within the right cerebellar hemisphere. Mild left ethmoid sinus mucosal thickening. Electronically Signed   By: Kellie Simmering D.O.   On: 05/26/2021 15:34   DG Chest Port 1 View  Result Date: 05/25/2021 CLINICAL DATA:  Weakness. Undergoing chemotherapy for stage IV breast cancer. EXAM: PORTABLE CHEST 1 VIEW COMPARISON:  01/29/2020. Chest, abdomen and pelvis CT dated 03/24/2021. FINDINGS: Normal sized heart. Clear lungs. Left jugular porta catheter tip in the inferior aspect of the superior vena cava near the superior cavoatrial junction. Mild scoliosis. IMPRESSION: No acute abnormality. Electronically Signed   By: Claudie Revering M.D.   On: 05/25/2021 15:41    ASSESSMENT AND PLAN: 1.  Metabolic Encephalopathy, persistent  2. ER/PR positive, HER2 positive metastatic breast cancer 3.  Transaminitis/hyperbilirubinemia likely due to worsening liver metastases 4.  Dehydration 5.  Failure to thrive/protein calorie malnutrition 6.  History of left upper extremity DVT 7.  Hypertension  -The patient remains to be lethargic.  I reviewed her brain MRI, which showed no evidence of acute stroke or brain metastasis.  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 -I discussed with Dr. Erlinda Hong and will start her on lactulose enema  -I had a family meeting with her daughter and multiple other family members (sisters and brother, her niece).  They had many questions about her nutrition, tube feeds, TPN, and cancer treatment it etc., I answered to their satisfaction.  I recommend hospice (she likely a candidate for residential hospice) if she does not improve in next few days).  After a lengthy discussion, all of her family members agreed with the plan. -code status discussed, they agree with DNR  -I will f/u tomorrow.   I spent a total of 40 mins with her care today.       LOS: 2 days   [name]  05/27/2021

## 2021-05-27 NOTE — Progress Notes (Signed)
PROGRESS NOTE    JOSILYNN LOSH  GEZ:[id number] DOB: 05-Aug-1955 DOA: 05/25/2021 PCP: [name], DO    Chief Complaint  Patient presents with   Weakness    Brief Narrative:  RUWEYDA MACKNIGHT is a 65 y.o. 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 Burr Medico input appreciated  Left arm per extremity DVT On Eliquis 2.5 mg daily   Nutritional Assessment: The patient's BMI is: Body mass index is 24.03 kg/m.Marland Kitchen        Unresulted Labs (From admission, onward)     Start     Ordered   05/27/21 0500  Comprehensive metabolic panel  Daily,   R      05/26/21 1258   05/27/21 0500  CBC  Daily,   R      05/26/21 1258   05/26/21 1319  Vitamin B1  ONCE - STAT,   STAT        05/26/21 1318   05/26/21 1318  Methylmalonic acid, serum  ONCE - STAT,   STAT        05/26/21 1318              DVT prophylaxis: apixaban (ELIQUIS) tablet 2.5 mg Start: 05/25/21 2200 apixaban (ELIQUIS) tablet 2.5 mg   Code Status: DNR Family Communication: daughter and  sister  Disposition:   Status is: Inpatient  Dispo: The patient is from: Home              Anticipated d/c is to: To be determined              Anticipated d/c date is: To be determined, need continued goals of care discussion, possible needing residential hospice                Consultants:  Hematology oncology  Procedures:  None  Antimicrobials:   Anti-infectives (From admission, onward)    None           Objective: Vitals:   05/26/21 1639 05/26/21 2110 05/27/21 0044 05/27/21 0458  BP: 124/90 (!) 128/97 116/89 112/84  Pulse: 93 99 94 98  Resp: 15 19 16 16   Temp: 98 F (36.7 C) 98.3 F (36.8 C) 99.1 F (37.3 C) 100 F (37.8 C)  TempSrc: Oral Oral Oral Oral  SpO2: 96% 100% 99% 96%  Weight:      Height:        Intake/Output Summary (Last 24 hours) at 05/27/2021 1135 Last data filed at 05/27/2021 0459 Gross per 24 hour  Intake 2014.48 ml  Output 1000 ml  Net 1014.48 ml   Filed Weights   05/25/21 1512  Weight: 63.5 kg  Examination:  General exam:  she is very lethargic, only open eyes to voice briefly, does not follow command Respiratory system:  diminished, respiratory effort normal. Cardiovascular system:  RRR.  Gastrointestinal system: Abdomen is nondistended, soft and nontender.  Normal bowel sounds heard. Central nervous system: Lethargic, does not follow command. Extremities:  no edema Skin: No rashes, lesions or ulcers Psychiatry: . Lethargic, does not follow command.    Data Reviewed: I have personally reviewed following labs and imaging studies  CBC: Recent Labs  Lab 05/22/21 1106 05/25/21 1530 05/26/21 0514 05/27/21 0517  WBC 5.8 5.8 8.2 8.2  NEUTROABS 4.4 3.5  --   --   HGB 11.8* 12.5 12.5 12.0  HCT 34.1* 37.2 37.0 36.0  MCV 83.6 84.7 84.3 85.9  PLT 209 225 196 902    Basic Metabolic Panel: Recent Labs  Lab 05/22/21 1106 05/25/21 1530 05/26/21 0514 05/27/21 0517  NA 130* 129* 130* 131*  K 4.9 5.3* 5.1 4.4  CL 98  98 100 102  CO2 21* 23 18* 20*  GLUCOSE 129* 118* 96 116*  BUN 19 29* 25* 16  CREATININE 1.67* 2.35* 1.49* 1.07*  CALCIUM 10.0 9.8 9.4 8.9    GFR: Estimated Creatinine Clearance: 45.9 mL/min (A) (by C-G formula based on SCr of 1.07 mg/dL (H)).  Liver Function Tests: Recent Labs  Lab 05/22/21 1106 05/25/21 1530 05/26/21 0514 05/27/21 0517  AST 159* 340* 548* 462*  ALT 86* 127* 159* 177*  ALKPHOS 361* 341* 335* 295*  BILITOT 2.5* 1.7* 2.8* 2.8*  PROT 7.4 7.4 6.9 6.8  ALBUMIN 3.6 3.4* 3.3* 3.1*    CBG: Recent Labs  Lab 05/27/21 0233  GLUCAP 82     Recent Results (from the past 240 hour(s))  Resp Panel by RT-PCR (Flu A&B, Covid) Nasopharyngeal Swab     Status: None   Collection Time: 05/25/21  3:30 PM   Specimen: Nasopharyngeal Swab; Nasopharyngeal(NP) swabs in vial transport medium  Result Value Ref Range Status   SARS Coronavirus 2 by RT PCR NEGATIVE NEGATIVE Final    Comment: (NOTE) SARS-CoV-2 target nucleic acids are NOT DETECTED.  The SARS-CoV-2 RNA is generally detectable in upper respiratory specimens during the acute phase of infection. The lowest concentration of SARS-CoV-2 viral copies this assay can detect is 138 copies/mL. A negative result does not preclude SARS-Cov-2 infection and should not be used as the sole basis for treatment or other patient management decisions. A negative result may occur with  improper specimen collection/handling, submission of specimen other than nasopharyngeal swab, presence of viral mutation(s) within the areas targeted by this assay, and inadequate number of viral copies(<138 copies/mL). A negative result must be combined with clinical observations, patient history, and epidemiological information. The expected result is Negative.  Fact Sheet for Patients:  EntrepreneurPulse.com.au  Fact Sheet for Healthcare Providers:  IncredibleEmployment.be  This test is no t yet approved or  cleared by the [place] FDA and  has been authorized for detection and/or diagnosis of SARS-CoV-2 by FDA under an Emergency Use Authorization (EUA). This EUA will remain  in effect (meaning this test can be used) for the duration of the COVID-19 declaration under Section 564(b)(1) of the Act, 21 U.S.C.section 360bbb-3(b)(1), unless the authorization is terminated  or revoked sooner.       Influenza A by PCR NEGATIVE NEGATIVE Final   Influenza B by PCR NEGATIVE NEGATIVE Final    Comment: (NOTE) The Xpert Xpress SARS-CoV-2/FLU/RSV plus assay is intended as an aid in  the diagnosis of influenza from Nasopharyngeal swab specimens and should not be used as a sole basis for treatment. Nasal washings and aspirates are unacceptable for Xpert Xpress SARS-CoV-2/FLU/RSV testing.  Fact Sheet for Patients: EntrepreneurPulse.com.au  Fact Sheet for Healthcare Providers: IncredibleEmployment.be  This test is not yet approved or cleared by the [place] FDA and has been authorized for detection and/or diagnosis of SARS-CoV-2 by FDA under an Emergency Use Authorization (EUA). This EUA will remain in effect (meaning this test can be used) for the duration of the COVID-19 declaration under Section 564(b)(1) of the Act, 21 U.S.C. section 360bbb-3(b)(1), unless the authorization is terminated or revoked.  Performed at [facility], [place] [address] [place], [place] [address]   Culture, blood (routine x 2)     Status: None (Preliminary result)   Collection Time: 05/25/21  3:35 PM   Specimen: BLOOD  Result Value Ref Range Status   Specimen Description   Final    BLOOD PORTA CATH Performed at [facility] [address], [place], [place] [address]    Special Requests   Final    BOTTLES DRAWN AEROBIC AND ANAEROBIC Blood Culture adequate volume Performed at [facility] [address],  [place], [place] [address]    Culture   Final    NO GROWTH 2 DAYS Performed at [facility] [address] [place], [place] [address]    Report Status PENDING  Incomplete  Culture, blood (routine x 2)     Status: None (Preliminary result)   Collection Time: 05/25/21  3:38 PM   Specimen: BLOOD RIGHT HAND  Result Value Ref Range Status   Specimen Description   Final    BLOOD RIGHT HAND Performed at [facility] [address], [place], [place] [address]    Special Requests   Final    BOTTLES DRAWN AEROBIC AND ANAEROBIC Blood Culture adequate volume Performed at [facility] [address], [place], [place] [address]    Culture   Final    NO GROWTH 2 DAYS Performed at [facility] [name], [place], [place] [address]    Report Status PENDING  Incomplete         Radiology Studies: CT Head Wo Contrast  Result Date: 05/25/2021 CLINICAL DATA:  Altered mental status. Undergoing chemotherapy for stage IV breast cancer. EXAM: CT HEAD WITHOUT CONTRAST TECHNIQUE: Contiguous axial images were obtained from the base of the skull through the vertex without intravenous contrast. COMPARISON:  Brain MR dated 01/06/2021.  Head CT dated 10/25/2005. FINDINGS: Brain: Normal appearing cerebral hemispheres and posterior fossa structures. Normal size and position of the ventricles. No intracranial hemorrhage, mass lesion or CT evidence of acute infarction. Vascular: No hyperdense vessel or unexpected calcification. Skull: Normal. Negative for fracture or focal lesion. Sinuses/Orbits: No acute finding. Other: None. IMPRESSION: No acute abnormality. Electronically Signed   By: Claudie Revering M.D.   On: 05/25/2021 16:05   MR BRAIN W WO CONTRAST  Result Date: 05/26/2021 CLINICAL DATA:  Mental status change, persistent or worsening. Additional history provided: Metastatic breast cancer, recent mental status change, evaluate for possible stroke versus  metastases. EXAM: MRI HEAD WITHOUT AND WITH CONTRAST TECHNIQUE: Multiplanar, multiecho pulse sequences of the brain and surrounding structures were obtained without and with intravenous contrast. CONTRAST:  52mL GADAVIST GADOBUTROL 1 MMOL/ML IV SOLN COMPARISON:  Prior head CT examinations 05/25/2021 and earlier. Brain MRI 01/06/2021. FINDINGS: Brain: Mild intermittent  motion degradation. Cerebral volume is within normal limits for age. Mild multifocal T2 FLAIR hyperintense signal abnormality within the cerebral white matter, nonspecific but compatible with chronic small vessel ischemic disease. Redemonstrated tiny chronic lacunar infarct within the right cerebellar hemisphere (series 8, image 7). There is no acute infarct. No evidence of an intracranial mass. No chronic intracranial blood products. No extra-axial fluid collection. No midline shift. Incidentally noted cavum septum pellucidum and cavum vergae. No pathologic intracranial enhancement is identified to suggest intracranial metastatic disease. Vascular: Maintained flow voids within the proximal large arterial vessels. Skull and upper cervical spine: No focal suspicious marrow lesion. Incompletely assessed cervical spondylosis. Sinuses/Orbits: Visualized orbits show no acute finding. Minimal mucosal thickening within the left ethmoid air cells. IMPRESSION: Mildly motion degraded exam. No evidence of acute intracranial abnormality. No evidence of intracranial metastatic disease. Mild chronic small vessel ischemic changes within the cerebral white matter, stable. Redemonstrated tiny chronic lacunar infarct within the right cerebellar hemisphere. Mild left ethmoid sinus mucosal thickening. Electronically Signed   By: Kellie Simmering D.O.   On: 05/26/2021 15:34   DG Chest Port 1 View  Result Date: 05/25/2021 CLINICAL DATA:  Weakness. Undergoing chemotherapy for stage IV breast cancer. EXAM: PORTABLE CHEST 1 VIEW COMPARISON:  01/29/2020. Chest, abdomen and  pelvis CT dated 03/24/2021. FINDINGS: Normal sized heart. Clear lungs. Left jugular porta catheter tip in the inferior aspect of the superior vena cava near the superior cavoatrial junction. Mild scoliosis. IMPRESSION: No acute abnormality. Electronically Signed   By: Claudie Revering M.D.   On: 05/25/2021 15:41        Scheduled Meds:  apixaban  2.5 mg Oral BID   Chlorhexidine Gluconate Cloth  6 each Topical Daily   DULoxetine  20 mg Oral Daily   gabapentin  300 mg Oral TID   lactulose  30 g Oral BID   montelukast  10 mg Oral QHS   Continuous Infusions:  sodium chloride Stopped (05/25/21 1740)   sodium chloride 75 mL/hr at 05/27/21 0540   thiamine injection 500 mg (05/27/21 0901)     LOS: 2 days   Time spent: 25 mins Greater than 50% of this time was spent in counseling, explanation of diagnosis, planning of further management, and coordination of care.   Voice Recognition Viviann Spare dictation system was used to create this note, attempts have been made to correct errors. Please contact the author with questions and/or clarifications.   Florencia Reasons, MD PhD FACP [facility]  Available via Epic secure chat 7am-7pm for nonurgent issues Please page for urgent issues To page the attending provider between 7A-7P or the covering provider during after hours 7P-7A, please log into the web site [email or web address] and access using universal [place] password for that web site. If you do not have the password, please call the hospital operator.    05/27/2021, 11:35 AM

## 2021-05-27 NOTE — Progress Notes (Signed)
Initial Nutrition Assessment  INTERVENTION:   Once diet advanced: -Ensure Enlive po TID, each supplement provides 350 kcal and 20 grams of protein -Multivitamin with minerals daily  NUTRITION DIAGNOSIS:   Increased nutrient needs related to cancer and cancer related treatments as evidenced by estimated needs.  GOAL:   Patient will meet greater than or equal to 90% of their needs  MONITOR:   Diet advancement, Labs, Weight trends, I & O's, Skin  REASON FOR ASSESSMENT:   Malnutrition Screening Tool    ASSESSMENT:   65 y.o. 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 intake mental status fatigue weakness.  Patient follows closely with Dr. Burr Medico oncology, most recent visit just last week for similar episode of worsening p.o. intake malaise and questionable syncope.  Worsening jaundice nausea per family with brought to the ED for further evaluation and treatment.  Patient in room, sleeping. Daughter and sister at bedside. Report that pt has not been eating well for the last 3 days PTA. Prior to that, pt was eating very little of her meals but was trying to drink 3 Ensures daily.  Pt takes a daily prenatal vitamin as well. Pt is followed by [facility], last spoken with on 8/29 by phone.  Per nursing assessment, pt was noted to have white coating on tongue. Was unable to get pt to open mouth to assess. Family states pt was exhibiting signs of poor swallowing PTA.   Per weight records, pt has lost 10 lbs since 6/17 (6% wt loss x 4.5 months, insignificant for time frame).   Medications: Lactulose, IV Thiamine  Labs reviewed:  CBGs: 82 Low Na  NUTRITION - FOCUSED PHYSICAL EXAM:  Flowsheet Row Most Recent Value  Orbital Region No depletion  Upper Arm  Region Mild depletion  Thoracic and Lumbar Region Unable to assess  Buccal Region No depletion  Temple Region No depletion  Clavicle Bone Region No depletion  Clavicle and Acromion Bone Region No depletion  Scapular Bone Region Unable to assess  Dorsal Hand No depletion  Patellar Region Mild depletion  Anterior Thigh Region Mild depletion  Posterior Calf Region Mild depletion  Edema (RD Assessment) None  Hair Reviewed  Eyes Unable to assess  [would not open eyes]  Mouth Unable to assess  [would not open mouth]  Skin Reviewed       Diet Order:   Diet Order             Diet NPO time specified  Diet effective now                   EDUCATION NEEDS:   No education needs have been identified at this time  Skin:  Skin Assessment: Reviewed RN Assessment  Last BM:  11/4  Height:   Ht Readings from Last 1 Encounters:  05/25/21 5\' 4"  (1.626 m)    Weight:   Wt Readings from Last 1 Encounters:  05/25/21 63.5 kg    BMI:  Body mass index is 24.03 kg/m.  Estimated Nutritional Needs:   Kcal:  1900-2100  Protein:  95-105g  Fluid:  2L/day  Clayton Bibles, MS, RD, LDN Inpatient Clinical Dietitian Contact information available via Amion

## 2021-05-27 NOTE — Plan of Care (Signed)
  Pt remains NPO spot check on bs completed due to npo it was 82. MD aware at hs that pt unable to take po meds due to npo status and is ordered eliquis no new orders placed. Pt turned approx every 2 hours. Temp trending up 99.1 to 100 this am. Parameters to notify provider at 100.5. Will continue to monitor.  Problem: Clinical Measurements: Goal: Ability to maintain clinical measurements within normal limits will improve Outcome: Not Progressing   Problem: Education: Goal: Knowledge of General Education information will improve Description: Including pain rating scale, medication(s)/side effects and non-pharmacologic comfort measures Outcome: Progressing   Problem: Health Behavior/Discharge Planning: Goal: Ability to manage health-related needs will improve Outcome: Progressing   Problem: Clinical Measurements: Goal: Will remain free from infection Outcome: Progressing Goal: Diagnostic test results will improve Outcome: Progressing Goal: Respiratory complications will improve Outcome: Progressing Goal: Cardiovascular complication will be avoided Outcome: Progressing   Problem: Activity: Goal: Risk for activity intolerance will decrease Outcome: Progressing   Problem: Nutrition: Goal: Adequate nutrition will be maintained Outcome: Progressing   Problem: Coping: Goal: Level of anxiety will decrease Outcome: Progressing   Problem: Elimination: Goal: Will not experience complications related to bowel motility Outcome: Progressing Goal: Will not experience complications related to urinary retention Outcome: Progressing   Problem: Pain Managment: Goal: General experience of comfort will improve Outcome: Progressing   Problem: Safety: Goal: Ability to remain free from injury will improve Outcome: Progressing   Problem: Skin Integrity: Goal: Risk for impaired skin integrity will decrease Outcome: Progressing

## 2021-05-28 DIAGNOSIS — G9340 Encephalopathy, unspecified: Secondary | ICD-10-CM | POA: Diagnosis not present

## 2021-05-28 LAB — COMPREHENSIVE METABOLIC PANEL
ALT: 147 U/L — ABNORMAL HIGH (ref 0–44)
AST: 394 U/L — ABNORMAL HIGH (ref 15–41)
Albumin: 3.1 g/dL — ABNORMAL LOW (ref 3.5–5.0)
Alkaline Phosphatase: 287 U/L — ABNORMAL HIGH (ref 38–126)
Anion gap: 8 (ref 5–15)
BUN: 18 mg/dL (ref 8–23)
CO2: 20 mmol/L — ABNORMAL LOW (ref 22–32)
Calcium: 8.7 mg/dL — ABNORMAL LOW (ref 8.9–10.3)
Chloride: 105 mmol/L (ref 98–111)
Creatinine, Ser: 0.96 mg/dL (ref 0.44–1.00)
GFR, Estimated: >60 mL/min (ref >60)
Glucose, Bld: 93 mg/dL (ref 70–99)
Potassium: 4 mmol/L (ref 3.5–5.1)
Sodium: 133 mmol/L — ABNORMAL LOW (ref 135–145)
Total Bilirubin: 2.9 mg/dL — ABNORMAL HIGH (ref 0.3–1.2)
Total Protein: 6.8 g/dL (ref 6.5–8.1)

## 2021-05-28 LAB — CBC
HCT: 33.4 % — ABNORMAL LOW (ref 36.0–46.0)
Hemoglobin: 11.2 g/dL — ABNORMAL LOW (ref 12.0–15.0)
MCH: 28.4 pg (ref 26.0–34.0)
MCHC: 33.5 g/dL (ref 30.0–36.0)
MCV: 84.6 fL (ref 80.0–100.0)
Platelets: 156 10*3/uL (ref 150–400)
RBC: 3.95 MIL/uL (ref 3.87–5.11)
RDW: 17.5 % — ABNORMAL HIGH (ref 11.5–15.5)
WBC: 8.2 10*3/uL (ref 4.0–10.5)
nRBC: 0.4 % — ABNORMAL HIGH (ref 0.0–0.2)

## 2021-05-28 MED ORDER — LACTULOSE 10 GM/15ML PO SOLN
30.0000 g | Freq: Two times a day (BID) | ORAL | Status: DC
  Administered 2021-05-28 – 2021-05-29 (×2): 30 g via ORAL
  Filled 2021-05-28 (×3): qty 45

## 2021-05-28 MED ORDER — ONDANSETRON HCL 4 MG/2ML IJ SOLN
4.0000 mg | Freq: Four times a day (QID) | INTRAMUSCULAR | Status: AC | PRN
  Administered 2021-05-28 – 2021-05-30 (×2): 4 mg via INTRAVENOUS
  Filled 2021-05-28 (×2): qty 2

## 2021-05-28 MED ORDER — LACTULOSE ENEMA
300.0000 mL | Freq: Two times a day (BID) | ORAL | Status: DC
  Filled 2021-05-28 (×3): qty 300

## 2021-05-28 MED ORDER — ENSURE ENLIVE PO LIQD
237.0000 mL | Freq: Three times a day (TID) | ORAL | Status: DC
  Administered 2021-05-29 – 2021-05-31 (×7): 237 mL via ORAL

## 2021-05-28 MED ORDER — LACTULOSE ENEMA
300.0000 mL | Freq: Once | ORAL | Status: DC
  Filled 2021-05-28: qty 300

## 2021-05-28 MED ORDER — LACTULOSE 10 GM/15ML PO SOLN
30.0000 g | Freq: Once | ORAL | Status: AC
  Administered 2021-05-28: 30 g via ORAL

## 2021-05-28 MED ORDER — ENOXAPARIN SODIUM 80 MG/0.8ML IJ SOSY: 1.0000 mg/kg | PREFILLED_SYRINGE | INTRAMUSCULAR | Status: DC

## 2021-05-28 MED ORDER — ENSURE ENLIVE PO LIQD
237.0000 mL | Freq: Two times a day (BID) | ORAL | Status: DC
  Administered 2021-05-28: 237 mL via ORAL

## 2021-05-28 MED ORDER — ADULT MULTIVITAMIN W/MINERALS CH
1.0000 | ORAL_TABLET | Freq: Every day | ORAL | Status: DC
  Administered 2021-05-29 – 2021-05-31 (×3): 1 via ORAL
  Filled 2021-05-28 (×4): qty 1

## 2021-05-28 MED ORDER — ENOXAPARIN SODIUM 80 MG/0.8ML IJ SOSY
1.0000 mg/kg | PREFILLED_SYRINGE | Freq: Two times a day (BID) | INTRAMUSCULAR | Status: DC
  Administered 2021-05-28 – 2021-05-31 (×7): 65 mg via SUBCUTANEOUS
  Filled 2021-05-28 (×7): qty 0.8

## 2021-05-28 NOTE — Progress Notes (Addendum)
HEMATOLOGY-ONCOLOGY PROGRESS NOTE  SUBJECTIVE: Teresa Hays is much more alert this morning.  She was able to get up and take a shower.  At the time of visit, she was sitting up in bed and taking lactulose and eating applesauce.  Bowels moved this morning.  Family members at the bedside  REVIEW OF SYSTEMS:   Review of Systems  Constitutional:  Negative for chills and fever.  Respiratory:  Negative for cough and shortness of breath.   Cardiovascular:  Negative for chest pain.  Gastrointestinal:  Negative for abdominal pain, nausea and vomiting.  Genitourinary:  Negative for dysuria.  Skin:  Negative for rash.  Neurological:  Negative for dizziness and headaches.    PHYSICAL EXAMINATION: ECOG PERFORMANCE STATUS: 2 - Symptomatic, <50% confined to bed  Vitals:   05/27/21 2046 05/28/21 0331  BP: 112/83 108/79  Pulse: 93 93  Resp: 12   Temp: 98 F (36.7 C) 98.8 F (37.1 C)  SpO2: 99% 97%   Filed Weights   05/25/21 1512 05/28/21 0400  Weight: 63.5 kg 65.7 kg    Intake/Output from previous day: 11/09 0701 - 11/10 0700 In: 0  Out: 325 [Urine:325]  GENERAL: Awake and alert, no distress SKIN: skin color, texture, turgor are normal, no rashes or significant lesions EYES: normal, Conjunctiva are pink and non-injected, sclera clear LUNGS: clear to auscultation and percussion with normal breathing effort HEART: regular rate & rhythm and no murmurs and no lower extremity edema ABDOMEN:abdomen soft, non-tender and normal bowel sounds  NEURO: Alert and oriented x3  LABORATORY DATA:  I have reviewed the data as listed CMP Latest Ref Rng & Units 05/28/2021 05/27/2021 05/26/2021  Glucose 70 - 99 mg/dL 93 116(H) 96  BUN 8 - 23 mg/dL 18 16 25(H)  Creatinine 0.44 - 1.00 mg/dL 0.96 1.07(H) 1.49(H)  Sodium 135 - 145 mmol/L 133(L) 131(L) 130(L)  Potassium 3.5 - 5.1 mmol/L 4.0 4.4 5.1  Chloride 98 - 111 mmol/L 105 102 100  CO2 22 - 32 mmol/L 20(L) 20(L) 18(L)  Calcium 8.9 - 10.3 mg/dL 8.7(L) 8.9  9.4  Total Protein 6.5 - 8.1 g/dL 6.8 6.8 6.9  Total Bilirubin 0.3 - 1.2 mg/dL 2.9(H) 2.8(H) 2.8(H)  Alkaline Phos 38 - 126 U/L 287(H) 295(H) 335(H)  AST 15 - 41 U/L 394(H) 462(H) 548(H)  ALT 0 - 44 U/L 147(H) 177(H) 159(H)    Lab Results  Component Value Date   WBC 8.2 05/28/2021   HGB 11.2 (L) 05/28/2021   HCT 33.4 (L) 05/28/2021   MCV 84.6 05/28/2021   PLT 156 05/28/2021   NEUTROABS 3.5 05/25/2021    CT Head Wo Contrast  Result Date: 05/25/2021 CLINICAL DATA:  Altered mental status. Undergoing chemotherapy for stage IV breast cancer. EXAM: CT HEAD WITHOUT CONTRAST TECHNIQUE: Contiguous axial images were obtained from the base of the skull through the vertex without intravenous contrast. COMPARISON:  Brain MR dated 01/06/2021.  Head CT dated 10/25/2005. FINDINGS: Brain: Normal appearing cerebral hemispheres and posterior fossa structures. Normal size and position of the ventricles. No intracranial hemorrhage, mass lesion or CT evidence of acute infarction. Vascular: No hyperdense vessel or unexpected calcification. Skull: Normal. Negative for fracture or focal lesion. Sinuses/Orbits: No acute finding. Other: None. IMPRESSION: No acute abnormality. Electronically Signed   By: Claudie Revering M.D.   On: 05/25/2021 16:05   MR BRAIN W WO CONTRAST  Result Date: 05/26/2021 CLINICAL DATA:  Mental status change, persistent or worsening. Additional history provided: Metastatic breast cancer, recent mental status  change, evaluate for possible stroke versus metastases. EXAM: MRI HEAD WITHOUT AND WITH CONTRAST TECHNIQUE: Multiplanar, multiecho pulse sequences of the brain and surrounding structures were obtained without and with intravenous contrast. CONTRAST:  63mL GADAVIST GADOBUTROL 1 MMOL/ML IV SOLN COMPARISON:  Prior head CT examinations 05/25/2021 and earlier. Brain MRI 01/06/2021. FINDINGS: Brain: Mild intermittent motion degradation. Cerebral volume is within normal limits for age. Mild multifocal  T2 FLAIR hyperintense signal abnormality within the cerebral white matter, nonspecific but compatible with chronic small vessel ischemic disease. Redemonstrated tiny chronic lacunar infarct within the right cerebellar hemisphere (series 8, image 7). There is no acute infarct. No evidence of an intracranial mass. No chronic intracranial blood products. No extra-axial fluid collection. No midline shift. Incidentally noted cavum septum pellucidum and cavum vergae. No pathologic intracranial enhancement is identified to suggest intracranial metastatic disease. Vascular: Maintained flow voids within the proximal large arterial vessels. Skull and upper cervical spine: No focal suspicious marrow lesion. Incompletely assessed cervical spondylosis. Sinuses/Orbits: Visualized orbits show no acute finding. Minimal mucosal thickening within the left ethmoid air cells. IMPRESSION: Mildly motion degraded exam. No evidence of acute intracranial abnormality. No evidence of intracranial metastatic disease. Mild chronic small vessel ischemic changes within the cerebral white matter, stable. Redemonstrated tiny chronic lacunar infarct within the right cerebellar hemisphere. Mild left ethmoid sinus mucosal thickening. Electronically Signed   By: Kellie Simmering D.O.   On: 05/26/2021 15:34   DG Chest Port 1 View  Result Date: 05/25/2021 CLINICAL DATA:  Weakness. Undergoing chemotherapy for stage IV breast cancer. EXAM: PORTABLE CHEST 1 VIEW COMPARISON:  01/29/2020. Chest, abdomen and pelvis CT dated 03/24/2021. FINDINGS: Normal sized heart. Clear lungs. Left jugular porta catheter tip in the inferior aspect of the superior vena cava near the superior cavoatrial junction. Mild scoliosis. IMPRESSION: No acute abnormality. Electronically Signed   By: Claudie Revering M.D.   On: 05/25/2021 15:41    ASSESSMENT AND PLAN: 1.  Metabolic Encephalopathy, improving 2.  ER/PR positive, HER2 positive metastatic breast cancer 3.   Transaminitis/hyperbilirubinemia likely due to worsening liver metastases 4.  Dehydration 5.  Failure to thrive/protein calorie malnutrition 6.  History of left upper extremity DVT 7.  Hypertension  -The patient is much more awake today.  She is able to get out of bed and has been able to eat.  Recommend continuation of lactulose.  Discussed the side effects with the patient and her family. -We will see how the patient recovers from this hospitalization.  If she continues to improve and her performance status and nutrition are good, we will discuss systemic treatment for her metastatic breast cancer as an outpatient. -Transaminitis and hyperbilirubinemia is overall stable.  Monitor. -Would recommend diet as tolerated.  Dietitian is following.   LOS: 3 days   Mikey Bussing  05/28/2021   Addendum  I have seen the patient, examined her. I agree with the assessment and and plan and have edited the notes.   Teresa Hays has much improved today, her encephalopathy is almost resolved now. She is tolerating soft diet well. Lab reviewed, stable abnormal LFTs. Continue supportive care, if she continues to do well, I anticipate she will go home in a few days. Her sister from [place] to stay with her for the next 2 months.  She would likely need a hospital bed on first floor since her bedroom is on the second floor. Will arrange palliative home care on discharge. It depends on how much she recovers,  we will decide  if she is still a candidate for more cancer treatment.   [name]  05/28/2021

## 2021-05-28 NOTE — Progress Notes (Signed)
PROGRESS NOTE    Teresa Hays  QMV:[id number] DOB: 07-04-1956 DOA: 05/25/2021 PCP: [name], DO    Chief Complaint  Patient presents with   Weakness    Brief Narrative:  Teresa Hays is a 65 y.o. 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 intake mental status fatigue weakness  Subjective:  She is still confused , she is not oriented to time but to place and person, she appears more alert and interactive this am, she got up with assistance and has a shower She reports epigastric pain and left lower quadrant pain, family reports she had a large bm, per documentation it was type 6 stool Sister desires her to get ensure, sister understands the risk of aspiration,   She did not get any oral lactulose or lactulose enema yesterday despite the orders  Assessment & Plan:   Active Problems:   AKI (acute kidney injury) ([place])  Acute metabolic encephalopathy Blood culture no growth, UA does not appear to have infection, chest x-ray no acute findings 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 Burr Medico input appreciated  Left arm per extremity DVT On Eliquis 2.5 mg daily, change to subQ  lovenox due to unreliable oral  intake   Nutritional Assessment: The patient's BMI is: Body mass index is 24.86 kg/m.Marland Kitchen        Unresulted Labs (From admission, onward)     Start     Ordered   05/28/21 1014  Gastrointestinal Panel by PCR , Stool  (Gastrointestinal Panel by PCR, Stool                                                                                                                                                     **Does Not include CLOSTRIDIUM DIFFICILE testing. **If CDIFF testing is needed, place order from the "C Difficile Testing" order set.**)  Once,   R        05/28/21 1013   05/27/21 0500  Comprehensive metabolic panel  Daily,   R      05/26/21 1258   05/27/21 0500  CBC  Daily,   R      05/26/21 1258   05/26/21 1319  Vitamin B1  ONCE - STAT,   STAT        05/26/21 1318   05/26/21 1318  Methylmalonic acid, serum  ONCE - STAT,   STAT        05/26/21 1318              DVT prophylaxis: on full anticoagulation    Code Status: DNR Family Communication: sister at bedside  Disposition:   Status is: Inpatient  Dispo: The patient is from: Home              Anticipated d/c is to: To be determined              Anticipated d/c date is: To be determined, need continued goals of care discussion, possible needing residential hospice                Consultants:  Hematology oncology  Procedures:  None  Antimicrobials:   Anti-infectives (From admission, onward)    None           Objective: Vitals:   05/27/21 1415 05/27/21 2046 05/28/21 0331 05/28/21 0400  BP: 108/88 112/83 108/79   Pulse: 98 93 93   Resp: 16 12    Temp: 99.2 F (37.3 C) 98 F (36.7 C) 98.8 F (37.1 C)   TempSrc: Oral Oral Oral   SpO2: 100% 99% 97%   Weight:    65.7 kg  Height:        Intake/Output Summary (Last 24 hours) at 05/28/2021 1040 Last data filed at 05/27/2021 1844 Gross per 24 hour  Intake 0 ml  Output 325 ml  Net -325 ml   Filed Weights   05/25/21 1512 05/28/21 0400  Weight: 63.5  kg 65.7 kg    Examination:  General exam:  more alert,  Respiratory system:  diminished, respiratory effort normal. Cardiovascular system:  RRR.  Gastrointestinal system: mild tender epigastric region and left lower quadrant .  Normal bowel sounds heard. Central nervous system: more alert, following commands Extremities:  no edema Skin: No rashes, lesions or ulcers Psychiatry: Marland Kitchen More alert ,following command.    Data Reviewed: I have personally reviewed following labs and imaging studies  CBC: Recent Labs  Lab 05/22/21 1106 05/25/21 1530 05/26/21 0514 05/27/21 0517 05/28/21 0514  WBC 5.8 5.8 8.2 8.2 8.2  NEUTROABS 4.4 3.5  --   --   --   HGB 11.8* 12.5 12.5 12.0 11.2*  HCT 34.1* 37.2 37.0 36.0 33.4*  MCV 83.6 84.7 84.3 85.9 84.6  PLT 209 225 196 183 170    Basic Metabolic Panel: Recent Labs  Lab 05/22/21 1106 05/25/21 1530 05/26/21 0514 05/27/21 0517 05/28/21 0514  NA 130* 129* 130* 131* 133*  K 4.9 5.3* 5.1 4.4 4.0  CL 98 98 100 102 105  CO2 21* 23 18* 20* 20*  GLUCOSE 129* 118* 96 116* 93  BUN 19 29* 25* 16 18  CREATININE 1.67* 2.35* 1.49* 1.07* 0.96  CALCIUM 10.0 9.8 9.4 8.9 8.7*    GFR: Estimated Creatinine Clearance: 55.2 mL/min (by C-G formula based on SCr of 0.96 mg/dL).  Liver Function Tests: Recent Labs  Lab 05/22/21 1106 05/25/21 1530 05/26/21 0514 05/27/21 0517 05/28/21 0514  AST 159* 340* 548* 462* 394*  ALT 86* 127* 159* 177* 147*  ALKPHOS 361* 341* 335* 295* 287*  BILITOT 2.5* 1.7* 2.8* 2.8* 2.9*  PROT 7.4 7.4 6.9 6.8 6.8  ALBUMIN 3.6 3.4* 3.3* 3.1* 3.1*    CBG: Recent Labs  Lab 05/27/21 0233  GLUCAP 82     Recent Results (from the past 240 hour(s))  Resp Panel by RT-PCR (Flu A&B, Covid) Nasopharyngeal Swab     Status: None   Collection Time: 05/25/21  3:30 PM   Specimen: Nasopharyngeal Swab; Nasopharyngeal(NP) swabs in vial transport medium  Result Value Ref Range Status   SARS Coronavirus 2 by RT PCR NEGATIVE NEGATIVE  Final    Comment: (NOTE) SARS-CoV-2 target nucleic acids are NOT DETECTED.  The SARS-CoV-2 RNA is generally detectable in upper respiratory specimens during the acute phase of infection. The lowest concentration of SARS-CoV-2 viral copies this assay can detect is 138 copies/mL. A negative result does not preclude SARS-Cov-2 infection and should not be used as the sole basis for treatment or other patient management decisions. A negative result may occur with  improper specimen collection/handling, submission of specimen other than nasopharyngeal swab, presence of viral mutation(s) within the areas targeted by this assay, and inadequate number of viral copies(<138 copies/mL). A negative result must be combined with clinical observations, patient history, and epidemiological information. The expected result is Negative.  Fact Sheet for Patients:  EntrepreneurPulse.com.au  Fact Sheet for Healthcare Providers:  IncredibleEmployment.be  This test is no t yet approved or cleared by the [place] FDA and  has been authorized for detection and/or diagnosis of SARS-CoV-2 by FDA under an Emergency Use Authorization (EUA). This EUA will remain  in effect (meaning this test can be used) for the duration of the COVID-19 declaration under Section 564(b)(1) of the Act, 21 U.S.C.section 360bbb-3(b)(1), unless the authorization is terminated  or revoked sooner.       Influenza A by PCR NEGATIVE NEGATIVE Final   Influenza B by PCR NEGATIVE NEGATIVE Final    Comment: (NOTE) The Xpert Xpress SARS-CoV-2/FLU/RSV plus assay is intended as an aid in the diagnosis of influenza from Nasopharyngeal swab specimens and should not be used as a sole basis for treatment. Nasal washings and aspirates are unacceptable for Xpert Xpress SARS-CoV-2/FLU/RSV testing.  Fact Sheet for Patients: EntrepreneurPulse.com.au  Fact Sheet for Healthcare  Providers: IncredibleEmployment.be  This test is not yet approved or cleared by the [place] FDA and has been authorized for detection and/or diagnosis of SARS-CoV-2 by FDA under an Emergency Use Authorization (EUA). This EUA will remain in effect (meaning this test can be used) for the duration of the COVID-19 declaration under Section 564(b)(1) of the Act, 21 U.S.C. section 360bbb-3(b)(1), unless the authorization is terminated or revoked.  Performed at [facility], [place] [address], [place], [place] [address]   Culture, blood (routine x 2)     Status: None (Preliminary result)   Collection Time: 05/25/21  3:35 PM   Specimen: BLOOD  Result Value Ref Range Status   Specimen Description   Final    BLOOD PORTA CATH Performed at [place] [address] [place], [place] [address]    Special Requests   Final    BOTTLES DRAWN AEROBIC AND ANAEROBIC Blood Culture adequate volume Performed at [place] [address], [place], [place] [address]    Culture   Final    NO GROWTH 3 DAYS Performed at [facility], [place] [address] [place], [place] [address]    Report Status PENDING  Incomplete  Culture, blood (routine x 2)     Status: None (Preliminary result)   Collection Time: 05/25/21  3:38 PM   Specimen: BLOOD RIGHT HAND  Result  Value Ref Range Status   Specimen Description   Final    BLOOD RIGHT HAND Performed at [place] [address], [place], [place] [address]    Special Requests   Final    BOTTLES DRAWN AEROBIC AND ANAEROBIC Blood Culture adequate volume Performed at [place] [address] [place], [place] [address]    Culture   Final    NO GROWTH 3 DAYS Performed at [facility], [place] [address], [place], [place] [address]    Report Status PENDING  Incomplete         Radiology Studies: MR BRAIN W WO CONTRAST  Result  Date: 05/26/2021 CLINICAL DATA:  Mental status change, persistent or worsening. Additional history provided: Metastatic breast cancer, recent mental status change, evaluate for possible stroke versus metastases. EXAM: MRI HEAD WITHOUT AND WITH CONTRAST TECHNIQUE: Multiplanar, multiecho pulse sequences of the brain and surrounding structures were obtained without and with intravenous contrast. CONTRAST:  5mL GADAVIST GADOBUTROL 1 MMOL/ML IV SOLN COMPARISON:  Prior head CT examinations 05/25/2021 and earlier. Brain MRI 01/06/2021. FINDINGS: Brain: Mild intermittent motion degradation. Cerebral volume is within normal limits for age. Mild multifocal T2 FLAIR hyperintense signal abnormality within the cerebral white matter, nonspecific but compatible with chronic small vessel ischemic disease. Redemonstrated tiny chronic lacunar infarct within the right cerebellar hemisphere (series 8, image 7). There is no acute infarct. No evidence of an intracranial mass. No chronic intracranial blood products. No extra-axial fluid collection. No midline shift. Incidentally noted cavum septum pellucidum and cavum vergae. No pathologic intracranial enhancement is identified to suggest intracranial metastatic disease. Vascular: Maintained flow voids within the proximal large arterial vessels. Skull and upper cervical spine: No focal suspicious marrow lesion. Incompletely assessed cervical spondylosis. Sinuses/Orbits: Visualized orbits show no acute finding. Minimal mucosal thickening within the left ethmoid air cells. IMPRESSION: Mildly motion degraded exam. No evidence of acute intracranial abnormality. No evidence of intracranial metastatic disease. Mild chronic small vessel ischemic changes within the cerebral white matter, stable. Redemonstrated tiny chronic lacunar infarct within the right cerebellar hemisphere. Mild left ethmoid sinus mucosal thickening. Electronically Signed   By: Kellie Simmering D.O.   On: 05/26/2021 15:34         Scheduled Meds:  Chlorhexidine Gluconate Cloth  6 each Topical Daily   DULoxetine  20 mg Oral Daily   enoxaparin (LOVENOX) injection  1 mg/kg Subcutaneous Q12H   feeding supplement  237 mL Oral BID BM   gabapentin  300 mg Oral TID   lactulose  30 g Oral BID   Or   lactulose  300 mL Rectal BID   lactulose  300 mL Rectal Once   montelukast  10 mg Oral QHS   Continuous Infusions:  sodium chloride Stopped (05/25/21 1740)   sodium chloride 75 mL/hr at 05/27/21 2106   thiamine injection 500 mg (05/28/21 0943)     LOS: 3 days   Time spent: 35 mins Greater than 50% of this time was spent in counseling, explanation of diagnosis, planning of further management, and coordination of care.   Voice Recognition Viviann Spare dictation system was used to create this note, attempts have been made to correct errors. Please contact the author with questions and/or clarifications.   Florencia Reasons, MD PhD FACP [facility]  Available via Epic secure chat 7am-7pm for nonurgent issues Please page for urgent issues To page the attending provider between 7A-7P or the covering provider during after hours 7P-7A, please log into  the web site [email or web address] and access using universal [place] password for that web site. If you do not have the password, please call the hospital operator.    05/28/2021, 10:40 AM

## 2021-05-28 NOTE — Evaluation (Signed)
SLP Cancellation Note  Patient Details Name: [name] MRN: [id number] DOB: September 28, 1955   Cancelled treatment:       Reason Eval/Treat Not Completed: Other (comment) (Order received for swallow evaluation.  SLP will see pt am on 05/29/2021 - attempt with functional meal observation) Kathleen Lime, MS [place] Office [phone] Pager [phone]   Macario Golds 05/28/2021, 3:54 PM

## 2021-05-29 DIAGNOSIS — N179 Acute kidney failure, unspecified: Secondary | ICD-10-CM | POA: Diagnosis not present

## 2021-05-29 DIAGNOSIS — G9340 Encephalopathy, unspecified: Secondary | ICD-10-CM | POA: Diagnosis not present

## 2021-05-29 LAB — VITAMIN B1: Vitamin B1 (Thiamine): 135.3 nmol/L (ref 66.5–200.0)

## 2021-05-29 LAB — COMPREHENSIVE METABOLIC PANEL
ALT: 117 U/L — ABNORMAL HIGH (ref 0–44)
AST: 243 U/L — ABNORMAL HIGH (ref 15–41)
Albumin: 2.9 g/dL — ABNORMAL LOW (ref 3.5–5.0)
Alkaline Phosphatase: 300 U/L — ABNORMAL HIGH (ref 38–126)
Anion gap: 10 (ref 5–15)
BUN: 14 mg/dL (ref 8–23)
CO2: 20 mmol/L — ABNORMAL LOW (ref 22–32)
Calcium: 8.8 mg/dL — ABNORMAL LOW (ref 8.9–10.3)
Chloride: 103 mmol/L (ref 98–111)
Creatinine, Ser: 0.76 mg/dL (ref 0.44–1.00)
GFR, Estimated: >60 mL/min (ref >60)
Glucose, Bld: 93 mg/dL (ref 70–99)
Potassium: 3.2 mmol/L — ABNORMAL LOW (ref 3.5–5.1)
Sodium: 133 mmol/L — ABNORMAL LOW (ref 135–145)
Total Bilirubin: 2.8 mg/dL — ABNORMAL HIGH (ref 0.3–1.2)
Total Protein: 6.6 g/dL (ref 6.5–8.1)

## 2021-05-29 LAB — GASTROINTESTINAL PANEL BY PCR, STOOL (REPLACES STOOL CULTURE)

## 2021-05-29 LAB — CBC
HCT: 30.7 % — ABNORMAL LOW (ref 36.0–46.0)
Hemoglobin: 10.6 g/dL — ABNORMAL LOW (ref 12.0–15.0)
MCH: 28.7 pg (ref 26.0–34.0)
MCHC: 34.5 g/dL (ref 30.0–36.0)
MCV: 83.2 fL (ref 80.0–100.0)
Platelets: 162 10*3/uL (ref 150–400)
RBC: 3.69 MIL/uL — ABNORMAL LOW (ref 3.87–5.11)
RDW: 17.5 % — ABNORMAL HIGH (ref 11.5–15.5)
WBC: 7.6 10*3/uL (ref 4.0–10.5)
nRBC: 0.5 % — ABNORMAL HIGH (ref 0.0–0.2)

## 2021-05-29 LAB — MAGNESIUM: Magnesium: 2 mg/dL (ref 1.7–2.4)

## 2021-05-29 LAB — AMMONIA: Ammonia: 41 umol/L — ABNORMAL HIGH (ref 9–35)

## 2021-05-29 LAB — TSH: TSH: 3.708 u[IU]/mL (ref 0.350–4.500)

## 2021-05-29 MED ORDER — POTASSIUM CHLORIDE 20 MEQ PO PACK
40.0000 meq | PACK | Freq: Once | ORAL | Status: AC
  Administered 2021-05-29: 40 meq via ORAL
  Filled 2021-05-29: qty 2

## 2021-05-29 MED ORDER — LACTULOSE 10 GM/15ML PO SOLN
10.0000 g | Freq: Two times a day (BID) | ORAL | Status: DC
  Administered 2021-05-29 – 2021-05-30 (×2): 10 g via ORAL
  Filled 2021-05-29 (×2): qty 15

## 2021-05-29 MED ORDER — MEGESTROL ACETATE 400 MG/10ML PO SUSP
600.0000 mg | Freq: Every day | ORAL | Status: DC
  Administered 2021-05-29 – 2021-05-31 (×3): 600 mg via ORAL
  Filled 2021-05-29 (×3): qty 20

## 2021-05-29 NOTE — TOC Initial Note (Addendum)
Transition of Care [facility]) - Initial/Assessment Note    Patient Details  Name: Teresa Hays MRN: [id number] Date of Birth: 1956-04-26  Transition of Care [facility]) CM/SW Contact:    Lynnell Catalan, RN Phone Number: 05/29/2021, 2:35 PM  Clinical Narrative:                 Spoke with sister for dc planning. Choice offered for home health PT and [facility] chosen. [facility] liaison contacted for referral. Will need HHPT order at dc. Pt has MD order for hospital bed. Sister declines as she said they don't need it. She is requesting a Rolator and 3in1. Orders received and [employer] contacted. TOC consult for home palliative services. [facility] liaison contacted for referral.  Expected Discharge Plan: [place] Barriers to Discharge: Continued Medical Work up   Patient Goals and CMS Choice   CMS Medicare.gov Compare Post Acute Care list provided to:: Patient Choice offered to / list presented to : Sibling  Expected Discharge Plan and Services Expected Discharge Plan: [place]   Discharge Planning Services: CM Consult Post Acute Care Choice: Home Health                   DME Arranged: Walker rolling with seat, 3-N-1 DME Agency: [employer] Date DME Agency Contacted: 05/29/21   Representative spoke with at DME Agency: [place]: PT [place]: [place] Date [place]: 05/29/21 Time HH Agency Contacted: 3 Representative spoke with at [place]: [place] Arrangements/Services   Lives with:: Self Patient language and need for interpreter reviewed:: Yes        Need for Family Participation in Patient Care: Yes (Comment) Care giver support system in place?: Yes (comment)   Criminal Activity/Legal Involvement Pertinent to Current Situation/Hospitalization: No - Comment as needed  Activities of Daily Living Home Assistive Devices/Equipment: Eyeglasses ADL Screening (condition at time of  admission) Patient's cognitive ability adequate to safely complete daily activities?: No Is the patient deaf or have difficulty hearing?: Yes Does the patient have difficulty seeing, even when wearing glasses/contacts?: No Does the patient have difficulty concentrating, remembering, or making decisions?: Yes Patient able to express need for assistance with ADLs?: Yes Does the patient have difficulty dressing or bathing?: Yes Independently performs ADLs?: No Communication: Independent Dressing (OT): Needs assistance Is this a change from baseline?: Pre-admission baseline Grooming: Needs assistance Is this a change from baseline?: Pre-admission baseline Feeding: Needs assistance Is this a change from baseline?: Pre-admission baseline Bathing: Needs assistance Is this a change from baseline?: Pre-admission baseline Toileting: Needs assistance Is this a change from baseline?: Pre-admission baseline In/Out Bed: Needs assistance Is this a change from baseline?: Pre-admission baseline Walks in Home: Dependent Is this a change from baseline?: Pre-admission baseline Does the patient have difficulty walking or climbing stairs?: Yes Weakness of Legs: Both Weakness of Arms/Hands: Both  Permission Sought/Granted   Permission granted to share information with : Yes, Verbal Permission Granted     Permission granted to share info w AGENCY: [place]        Emotional Assessment              Admission diagnosis:  Dehydration [E86.0] Encephalopathy [G93.40] AKI (acute kidney injury) ([place]) [N17.9] Patient Active Problem List   Diagnosis Date Noted   Encephalopathy    AKI (acute kidney injury) ([place]) 05/25/2021   Mild intermittent asthma without complication 85/27/7824   Asthma 04/02/2020   Cough,  persistent 11/24/2019   Shortness of breath 11/23/2019   Gastroesophageal reflux disease 10/16/2019   Health maintenance examination 10/16/2019   Dysphagia 10/12/2019   Peripheral neuropathy  due to chemotherapy ([place]) 04/06/2019   Anemia due to chemotherapy 08/03/2018   Port-A-Cath in place 04/21/2018   Metastatic breast cancer ([place]) 03/30/2018   Goals of care, counseling/discussion 03/30/2018   Liver masses 03/16/2018   PCP:  [name], DO Pharmacy:   [place] [address] Phone: [phone] Fax: [phone]     Social Determinants of Health (SDOH) Interventions    Readmission Risk Interventions Readmission Risk Prevention Plan 05/29/2021  Transportation Screening Complete  PCP or Specialist Appt within 3-5 Days Complete  HRI or [place] Complete  Social Work Consult for [place] Planning/Counseling Complete  Palliative Care Screening Complete  Medication Review Press photographer) Complete  Some recent data might be hidden

## 2021-05-29 NOTE — Evaluation (Signed)
Physical Therapy Evaluation Patient Details Name: Teresa Hays MRN: [id number] DOB: [birth year] Today's Date: 05/29/2021  History of Present Illness  65 y.o. female who was admitted 05/25/21 with worsening p.o. intake mental status fatigue weakness. Dx of AKI. 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 Pt ambulated 100' with RW, with min A to negotiate obstacles, distance limited by fatigue. Gait is slow but steady. Pt's sister reports family will provide 41* assistance at home. HHPT and rollator recommended.  Pt currently with functional limitations due to the deficits listed below (see PT Problem List). Pt will benefit from skilled PT to increase their independence and safety with mobility to allow discharge to the venue listed below.          Recommendations for follow up therapy are one component of a multi-disciplinary discharge planning process, led by the attending physician.  Recommendations may be updated based on patient status, additional functional criteria and insurance authorization.  Follow Up Recommendations Home health PT    Assistance Recommended at Discharge Intermittent Supervision/Assistance  Functional Status Assessment Patient has had a recent decline in their functional status and demonstrates the ability to make significant improvements in function in a reasonable and predictable amount of time.  Equipment Recommendations  Rollator (4 wheels)    Recommendations for Other Services       Precautions / Restrictions Precautions Precautions: Fall Precaution Comments: denies h/o falls in past 6 months Restrictions Weight Bearing Restrictions: No      Mobility  Bed Mobility Overal bed mobility: Needs Assistance Bed  Mobility: Sit to Supine       Sit to supine: Min assist   General bed mobility comments: min A for BLEs into bed    Transfers Overall transfer level: Needs assistance Equipment used: Rolling walker (2 wheels) Transfers: Sit to/from Stand Sit to Stand: Min assist           General transfer comment: VCs hand placement, min A to power up    Ambulation/Gait Ambulation/Gait assistance: Min assist Gait Distance (Feet): 100 Feet Assistive device: Rolling walker (2 wheels) Gait Pattern/deviations: Decreased step length - right;Decreased step length - left;Step-through pattern Gait velocity: decr     General Gait Details: slow but steady, min A to steer RW to avoid obstacles  Stairs            Wheelchair Mobility    Modified Rankin (Stroke Patients Only)       Balance Overall balance assessment: Needs assistance   Sitting balance-Leahy Scale: Good       Standing balance-Leahy Scale: Fair                               Pertinent Vitals/Pain Pain Assessment: Faces Pain Score: 0-No pain Faces Pain Scale: No hurt    Home Living Family/patient expects to be discharged to:: Private residence Living Arrangements: Alone Available Help at Discharge: Family;Available 24 hours/day   Home Access: Stairs to enter   Entrance Stairs-Number of Steps: 2 Alternate Level Stairs-Number of Steps: flight Home Layout: Two level;Bed/bath upstairs Home Equipment: None      Prior Function Prior Level of Function : Independent/Modified Independent             Mobility Comments: did  not use AD, denies falls in past 6 months       Hand Dominance        Extremity/Trunk Assessment   Upper Extremity Assessment Upper Extremity Assessment: Generalized weakness    Lower Extremity Assessment Lower Extremity Assessment: Generalized weakness    Cervical / Trunk Assessment Cervical / Trunk Assessment: Normal  Communication   Communication: No  difficulties  Cognition Arousal/Alertness: Awake/alert   Overall Cognitive Status: Impaired/Different from baseline Area of Impairment: Awareness;Problem solving;Safety/judgement                             Problem Solving: Slow processing;Requires verbal cues;Requires tactile cues General Comments: pleasant, able to follow commands with increased time and multimodal cues, did not correct her course when she bumped wall with RW        General Comments      Exercises     Assessment/Plan    PT Assessment Patient needs continued PT services  PT Problem List Decreased mobility;Decreased activity tolerance;Decreased balance;Decreased knowledge of use of DME       PT Treatment Interventions Therapeutic activities;Gait training;Patient/family education    PT Goals (Current goals can be found in the Care Plan section)  Acute Rehab PT Goals Patient Stated Goal: visit granddaughter in [place] PT Goal Formulation: With patient Time For Goal Achievement: 06/12/21 Potential to Achieve Goals: Good    Frequency Min 3X/week   Barriers to discharge        Co-evaluation               AM-PAC PT "6 Clicks" Mobility  Outcome Measure Help needed turning from your back to your side while in a flat bed without using bedrails?: A Little Help needed moving from lying on your back to sitting on the side of a flat bed without using bedrails?: A Little Help needed moving to and from a bed to a chair (including a wheelchair)?: A Little Help needed standing up from a chair using your arms (e.g., wheelchair or bedside chair)?: A Little Help needed to walk in hospital room?: A Little Help needed climbing 3-5 steps with a railing? : A Little 6 Click Score: 18    End of Session Equipment Utilized During Treatment: Gait belt Activity Tolerance: Patient tolerated treatment well Patient left: in bed;with nursing/sitter in room;with call bell/phone within reach;with family/visitor  present Nurse Communication: Mobility status PT Visit Diagnosis: Difficulty in walking, not elsewhere classified (R26.2);Unsteadiness on feet (R26.81)    Time: 3154-0086 PT Time Calculation (min) (ACUTE ONLY): 28 min   Charges:   PT Evaluation $PT Eval Moderate Complexity: 1 Mod Gait training (8-22 min) 1         Blondell Reveal Kistler PT 05/29/2021  Acute Rehabilitation Services Pager [phone] Office [phone]

## 2021-05-29 NOTE — Evaluation (Signed)
Clinical/Bedside Swallow Evaluation Patient Details  Name: GALEN MALKOWSKI MRN: [id number] Date of Birth: 07-29-1955  Today's Date: 05/29/2021 Time: SLP Start Time (ACUTE ONLY): 0910 SLP Stop Time (ACUTE ONLY): 1000 SLP Time Calculation (min) (ACUTE ONLY): 50 min  Past Medical History:  Past Medical History:  Diagnosis Date   GERD (gastroesophageal reflux disease)    Hypertension    Personal history of chemotherapy    rt breast ca with mets to liver dx'd 03/2018   Past Surgical History:  Past Surgical History:  Procedure Laterality Date   BREAST BIOPSY Right 03/24/2018   CA x3   BREAST BIOPSY Left 03/28/2018   neg   BREAST BIOPSY Left 03/30/2018   neg   COLONOSCOPY     ESOPHAGOGASTRODUODENOSCOPY ENDOSCOPY     IR IMAGING GUIDED PORT INSERTION  04/04/2018   HPI:  JAZMINE HECKMAN is a 65 y.o. 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 intake mental status fatigue weakness    Assessment / Plan / Recommendation  Clinical Impression  Patient presents with minimal suspected cognitive based oral dysphagia c/b clinical inconsistent delayed swallow/prolonged mastication. NO indication of aspiration noted with all intake including water, orange juice, oatmeal, and graham crackers.    Pt did not pass 3 ounce Yale water challenge due to needing rest break.  Minimal erucation noted which pt denies being productive.  Minimal oral retention noted which pt was able to clear indepenedently using liquids. Advised her and her sister, if pt is coughing with liquid "wash" to try thicker drink *Ensure* or applesauce, etc to clear oral retention.  Encouraged pt to self feed for maximal airway protection.  Recommend to advance diet to regular/thin to allow family to order items  for pt *sister reports pt always has a family member present.  No SLP follow up indicated as swallow is functional and education for compensation compelted. Thanks for this consult. SLP Visit Diagnosis: Dysphagia, oral phase (R13.11)    Aspiration Risk  Mild aspiration risk    Diet Recommendation Regular;Thin liquid   Liquid Administration via: Cup;Straw Medication Administration: Whole meds with puree Supervision: Patient able to self feed Compensations: Slow rate;Small sips/bites Postural Changes: Seated upright at 90 degrees;Remain upright for at least 30 minutes after po intake    Other  Recommendations Oral Care Recommendations: Oral care BID    Recommendations for follow up therapy are one component of a multi-disciplinary discharge planning process, led by the attending physician.  Recommendations may be updated based on patient status, additional functional criteria and insurance authorization.  Follow up Recommendations No SLP follow up      Assistance Recommended at Discharge None  Functional Status Assessment  N/a  Frequency and Duration   N/a         Prognosis    N/a    Swallow Study   General Date of Onset: 05/29/21 HPI: MARYKATE HEUBERGER is a 65 y.o. 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 intake mental status fatigue weakness Type of Study: Bedside Swallow Evaluation Diet Prior to this Study: Dysphagia 3 (soft);Thin liquids Temperature Spikes Noted: No Respiratory Status: Room air History  of Recent Intubation: No Behavior/Cognition: Alert;Cooperative;Pleasant mood Oral Cavity Assessment: Within Functional Limits Oral Care Completed by SLP: No Oral Cavity - Dentition: Adequate natural dentition Vision: Functional for  self-feeding Self-Feeding Abilities: Able to feed self Patient Positioning: Upright in bed Baseline Vocal Quality: Normal Volitional Cough: Weak Volitional Swallow: Able to elicit    Oral/Motor/Sensory Function Overall Oral Motor/Sensory Function: Within functional limits   Ice Chips Ice chips: Not tested   Thin Liquid Thin Liquid: Impaired Presentation: Cup;Straw Oral Phase Functional Implications: Oral holding Other Comments: inconsistent oral hold/delay suspected - no indication of airway compromise    Nectar Thick Nectar Thick Liquid: Not tested   Honey Thick Honey Thick Liquid: Not tested   Puree Puree: Within functional limits Presentation: Self Fed;Spoon Oral Phase Functional Implications: Prolonged oral transit Pharyngeal Phase Impairments: Suspected delayed Swallow   Solid     Solid: Impaired Oral Phase Impairments: Impaired mastication Oral Phase Functional Implications: Prolonged oral transit;Oral residue;Impaired mastication      Macario Golds 05/29/2021,10:47 AM  Kathleen Lime, MS [facility] SLP [place] Office [phone] Pager [phone]

## 2021-05-29 NOTE — Progress Notes (Signed)
PROGRESS NOTE    [name]  JJK:[id number] DOB: [birth year] DOA: 05/25/2021 PCP: [name], DO    Chief Complaint  Patient presents with   Weakness    Brief Narrative:  Teresa Hays is a 65 y.o. 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 Burr Medico input appreciated  Left arm per extremity DVT On Eliquis 2.5 mg daily, change to subQ  lovenox due to unreliable oral intake Plan to transition back to Eliquis upon discharge  FTT: Evaluated by physical therapy, recommend home health PT, home DME, order placed   Nutritional Assessment: The patient's BMI is: Body mass index is 24.86 kg/m.Marland Kitchen        Unresulted Labs (From admission,  onward)     Start     Ordered   05/30/21 0500  Magnesium  Tomorrow morning,   R       Question:  Specimen collection method  Answer:  Unit=Unit collect   05/29/21 1728   05/30/21 0500  Ammonia  Tomorrow morning,   R       Question:  Specimen collection method  Answer:  Unit=Unit collect   05/29/21 1728   05/28/21 1014  Gastrointestinal Panel by PCR , Stool  (Gastrointestinal Panel by PCR, Stool                                                                                                                                                     **Does Not include CLOSTRIDIUM DIFFICILE testing. **If CDIFF testing is needed, place order from the "C Difficile Testing" order set.**)  Once,   R        05/28/21 1013  05/27/21 0500  Comprehensive metabolic panel  Daily,   R      05/26/21 1258   05/27/21 0500  CBC  Daily,   R      05/26/21 1258   05/26/21 1318  Methylmalonic acid, serum  ONCE - STAT,   STAT        05/26/21 1318              DVT prophylaxis: on full anticoagulation    Code Status: DNR Family Communication: sister at bedside  Disposition:   Status is: Inpatient  Dispo: The patient is from: Home              Anticipated d/c is to: Home with home health, outpatient palliative care              Anticipated d/c date is: Possible tomorrow, if she continues to improve                Consultants:  Hematology oncology  Procedures:  None  Antimicrobials:   Anti-infectives (From admission, onward)    None           Objective: Vitals:   05/28/21 0400 05/28/21 1540 05/28/21 2136 05/29/21 0451  BP:  (!) 127/97 104/79 108/82  Pulse:  91 96 92  Resp:  16 16 17   Temp:  98 F (36.7 C) 98.3 F (36.8 C) 98.7 F (37.1 C)  TempSrc:  Oral Oral Oral  SpO2:  100% 100% 97%  Weight: 65.7 kg     Height:        Intake/Output Summary (Last 24 hours) at 05/29/2021 1728 Last data filed at 05/29/2021 1030 Gross per 24 hour  Intake 120 ml  Output 100 ml  Net 20 ml    Filed Weights   05/25/21 1512 05/28/21 0400  Weight: 63.5 kg 65.7 kg    Examination:  General exam: Currently taking a nap, sister reports she was more alert this morning Respiratory system:  diminished, respiratory effort normal. Cardiovascular system:  RRR.  Gastrointestinal system:  Normal bowel sounds. Central nervous system: Sleeping Extremities:  no edema Skin: No rashes, lesions or ulcers Psychiatry: .  Sleeping    Data Reviewed: I have personally reviewed following labs and imaging studies  CBC: Recent Labs  Lab 05/25/21 1530 05/26/21 0514 05/27/21 0517 05/28/21 0514 05/29/21 0529  WBC 5.8 8.2 8.2 8.2 7.6  NEUTROABS 3.5  --   --   --   --   HGB 12.5 12.5 12.0 11.2* 10.6*  HCT 37.2 37.0 36.0 33.4* 30.7*  MCV 84.7 84.3 85.9 84.6 83.2  PLT 225 196 183 156 062    Basic Metabolic Panel: Recent Labs  Lab 05/25/21 1530 05/26/21 0514 05/27/21 0517 05/28/21 0514 05/29/21 0529  NA 129* 130* 131* 133* 133*  K 5.3* 5.1 4.4 4.0 3.2*  CL 98 100 102 105 103  CO2 23 18* 20* 20* 20*  GLUCOSE 118* 96 116* 93 93  BUN 29* 25* 16 18 14   CREATININE 2.35* 1.49* 1.07* 0.96 0.76  CALCIUM 9.8 9.4 8.9 8.7* 8.8*  MG  --   --   --   --  2.0    GFR: Estimated Creatinine Clearance: 66.3 mL/min (by C-G formula based on SCr of 0.76 mg/dL).  Liver Function Tests: Recent Labs  Lab 05/25/21 1530 05/26/21 0514 05/27/21 0517 05/28/21 0514 05/29/21 0529  AST 340* 548* 462* 394* 243*  ALT 127* 159* 177* 147* 117*  ALKPHOS 341* 335* 295* 287* 300*  BILITOT 1.7* 2.8* 2.8* 2.9* 2.8*  PROT 7.4 6.9 6.8 6.8 6.6  ALBUMIN 3.4* 3.3* 3.1* 3.1* 2.9*    CBG: Recent Labs  Lab 05/27/21 0233  GLUCAP 82     Recent Results (from the past 240 hour(s))  Resp Panel by RT-PCR (Flu A&B, Covid) Nasopharyngeal Swab     Status: None   Collection Time: 05/25/21  3:30 PM   Specimen: Nasopharyngeal Swab; Nasopharyngeal(NP) swabs in vial transport medium  Result Value Ref Range Status    SARS Coronavirus 2 by RT PCR NEGATIVE NEGATIVE Final    Comment: (NOTE) SARS-CoV-2 target nucleic acids are NOT DETECTED.  The SARS-CoV-2 RNA is generally detectable in upper respiratory specimens during the acute phase of infection. The lowest concentration of SARS-CoV-2 viral copies this assay can detect is 138 copies/mL. A negative result does not preclude SARS-Cov-2 infection and should not be used as the sole basis for treatment or other patient management decisions. A negative result may occur with  improper specimen collection/handling, submission of specimen other than nasopharyngeal swab, presence of viral mutation(s) within the areas targeted by this assay, and inadequate number of viral copies(<138 copies/mL). A negative result must be combined with clinical observations, patient history, and epidemiological information. The expected result is Negative.  Fact Sheet for Patients:  EntrepreneurPulse.com.au  Fact Sheet for Healthcare Providers:  IncredibleEmployment.be  This test is no t yet approved or cleared by the [place] FDA and  has been authorized for detection and/or diagnosis of SARS-CoV-2 by FDA under an Emergency Use Authorization (EUA). This EUA will remain  in effect (meaning this test can be used) for the duration of the COVID-19 declaration under Section 564(b)(1) of the Act, 21 U.S.C.section 360bbb-3(b)(1), unless the authorization is terminated  or revoked sooner.       Influenza A by PCR NEGATIVE NEGATIVE Final   Influenza B by PCR NEGATIVE NEGATIVE Final    Comment: (NOTE) The Xpert Xpress SARS-CoV-2/FLU/RSV plus assay is intended as an aid in the diagnosis of influenza from Nasopharyngeal swab specimens and should not be used as a sole basis for treatment. Nasal washings and aspirates are unacceptable for Xpert Xpress SARS-CoV-2/FLU/RSV testing.  Fact Sheet for  Patients: EntrepreneurPulse.com.au  Fact Sheet for Healthcare Providers: IncredibleEmployment.be  This test is not yet approved or cleared by the [place] FDA and has been authorized for detection and/or diagnosis of SARS-CoV-2 by FDA under an Emergency Use Authorization (EUA). This EUA will remain in effect (meaning this test can be used) for the duration of the COVID-19 declaration under Section 564(b)(1) of the Act, 21 U.S.C. section 360bbb-3(b)(1), unless the authorization is terminated or revoked.  Performed at [facility], [place] [address], [place], [place] [address]   Culture, blood (routine x 2)     Status: None (Preliminary result)   Collection Time: 05/25/21  3:35 PM   Specimen: BLOOD  Result Value Ref Range Status   Specimen Description   Final    BLOOD PORTA CATH Performed at [facility] [address] [name], [place] [address]    Special Requests   Final    BOTTLES DRAWN AEROBIC AND ANAEROBIC Blood Culture adequate volume Performed at [facility] [address] [place], [place] [address]    Culture   Final    NO GROWTH 4 DAYS Performed at [facility], [place] [address] [place], [place] [address]    Report Status PENDING  Incomplete  Culture, blood (  routine x 2)     Status: None (Preliminary result)   Collection Time: 05/25/21  3:38 PM   Specimen: BLOOD RIGHT HAND  Result Value Ref Range Status   Specimen Description   Final    BLOOD RIGHT HAND Performed at [facility] [address] [place], [place] [address]    Special Requests   Final    BOTTLES DRAWN AEROBIC AND ANAEROBIC Blood Culture adequate volume Performed at [facility] [address] [place], [place] [address]    Culture   Final    NO GROWTH 4 DAYS Performed at [facility], [place] [address] [place], [place] [address]    Report Status  PENDING  Incomplete         Radiology Studies: No results found.      Scheduled Meds:  Chlorhexidine Gluconate Cloth  6 each Topical Daily   DULoxetine  20 mg Oral Daily   enoxaparin (LOVENOX) injection  1 mg/kg Subcutaneous Q12H   feeding supplement  237 mL Oral TID BM   gabapentin  300 mg Oral TID   lactulose  30 g Oral BID   Or   lactulose  300 mL Rectal BID   montelukast  10 mg Oral QHS   multivitamin with minerals  1 tablet Oral Daily   Continuous Infusions:  sodium chloride Stopped (05/25/21 1740)   sodium chloride 75 mL/hr at 05/29/21 0510     LOS: 4 days   Time spent: 25 mins Greater than 50% of this time was spent in counseling, explanation of diagnosis, planning of further management, and coordination of care.   Voice Recognition Viviann Spare dictation system was used to create this note, attempts have been made to correct errors. Please contact the author with questions and/or clarifications.   Florencia Reasons, MD PhD FACP [facility]  Available via Epic secure chat 7am-7pm for nonurgent issues Please page for urgent issues To page the attending provider between 7A-7P or the covering provider during after hours 7P-7A, please log into the web site [email or web address] and access using universal [place] password for that web site. If you do not have the password, please call the hospital operator.    05/29/2021, 5:28 PM

## 2021-05-29 NOTE — Progress Notes (Signed)
HEMATOLOGY-ONCOLOGY PROGRESS NOTE  SUBJECTIVE: Teresa Hays is clinically stable, no confusion, appetite is low but she did eat and drink some, and was able to walk in hallway. She has 5 loose BM today, on lactulose twice a day.    PHYSICAL EXAMINATION: ECOG PERFORMANCE STATUS: 2 - Symptomatic, <50% confined to bed  Vitals:   05/28/21 2136 05/29/21 0451  BP: 104/79 108/82  Pulse: 96 92  Resp: 16 17  Temp: 98.3 F (36.8 C) 98.7 F (37.1 C)  SpO2: 100% 97%   Filed Weights   05/25/21 1512 05/28/21 0400  Weight: 140 lb (63.5 kg) 144 lb 13.5 oz (65.7 kg)    Intake/Output from previous day: 11/10 0701 - 11/11 0700 In: -  Out: 100 [Emesis/NG output:100]  GENERAL: Awake and alert, no distress SKIN: skin color, texture, turgor are normal, no rashes or significant lesions EYES: normal, Conjunctiva are pink and non-injected, sclera clear LUNGS: clear to auscultation and percussion with normal breathing effort HEART: regular rate & rhythm and no murmurs and no lower extremity edema ABDOMEN:abdomen soft, non-tender and normal bowel sounds  NEURO: Alert and oriented x3  LABORATORY DATA:  I have reviewed the data as listed CMP Latest Ref Rng & Units 05/29/2021 05/28/2021 05/27/2021  Glucose 70 - 99 mg/dL 93 93 116(H)  BUN 8 - 23 mg/dL $Remove'14 18 16  'tnaAMci$ Creatinine 0.44 - 1.00 mg/dL 0.76 0.96 1.07(H)  Sodium 135 - 145 mmol/L 133(L) 133(L) 131(L)  Potassium 3.5 - 5.1 mmol/L 3.2(L) 4.0 4.4  Chloride 98 - 111 mmol/L 103 105 102  CO2 22 - 32 mmol/L 20(L) 20(L) 20(L)  Calcium 8.9 - 10.3 mg/dL 8.8(L) 8.7(L) 8.9  Total Protein 6.5 - 8.1 g/dL 6.6 6.8 6.8  Total Bilirubin 0.3 - 1.2 mg/dL 2.8(H) 2.9(H) 2.8(H)  Alkaline Phos 38 - 126 U/L 300(H) 287(H) 295(H)  AST 15 - 41 U/L 243(H) 394(H) 462(H)  ALT 0 - 44 U/L 117(H) 147(H) 177(H)    Lab Results  Component Value Date   WBC 7.6 05/29/2021   HGB 10.6 (L) 05/29/2021   HCT 30.7 (L) 05/29/2021   MCV 83.2 05/29/2021   PLT 162 05/29/2021   NEUTROABS 3.5  05/25/2021    CT Head Wo Contrast  Result Date: 05/25/2021 CLINICAL DATA:  Altered mental status. Undergoing chemotherapy for stage IV breast cancer. EXAM: CT HEAD WITHOUT CONTRAST TECHNIQUE: Contiguous axial images were obtained from the base of the skull through the vertex without intravenous contrast. COMPARISON:  Brain MR dated 01/06/2021.  Head CT dated 10/25/2005. FINDINGS: Brain: Normal appearing cerebral hemispheres and posterior fossa structures. Normal size and position of the ventricles. No intracranial hemorrhage, mass lesion or CT evidence of acute infarction. Vascular: No hyperdense vessel or unexpected calcification. Skull: Normal. Negative for fracture or focal lesion. Sinuses/Orbits: No acute finding. Other: None. IMPRESSION: No acute abnormality. Electronically Signed   By: Claudie Revering M.D.   On: 05/25/2021 16:05   MR BRAIN W WO CONTRAST  Result Date: 05/26/2021 CLINICAL DATA:  Mental status change, persistent or worsening. Additional history provided: Metastatic breast cancer, recent mental status change, evaluate for possible stroke versus metastases. EXAM: MRI HEAD WITHOUT AND WITH CONTRAST TECHNIQUE: Multiplanar, multiecho pulse sequences of the brain and surrounding structures were obtained without and with intravenous contrast. CONTRAST:  30mL GADAVIST GADOBUTROL 1 MMOL/ML IV SOLN COMPARISON:  Prior head CT examinations 05/25/2021 and earlier. Brain MRI 01/06/2021. FINDINGS: Brain: Mild intermittent motion degradation. Cerebral volume is within normal limits for age. Mild multifocal T2 FLAIR  hyperintense signal abnormality within the cerebral white matter, nonspecific but compatible with chronic small vessel ischemic disease. Redemonstrated tiny chronic lacunar infarct within the right cerebellar hemisphere (series 8, image 7). There is no acute infarct. No evidence of an intracranial mass. No chronic intracranial blood products. No extra-axial fluid collection. No midline shift.  Incidentally noted cavum septum pellucidum and cavum vergae. No pathologic intracranial enhancement is identified to suggest intracranial metastatic disease. Vascular: Maintained flow voids within the proximal large arterial vessels. Skull and upper cervical spine: No focal suspicious marrow lesion. Incompletely assessed cervical spondylosis. Sinuses/Orbits: Visualized orbits show no acute finding. Minimal mucosal thickening within the left ethmoid air cells. IMPRESSION: Mildly motion degraded exam. No evidence of acute intracranial abnormality. No evidence of intracranial metastatic disease. Mild chronic small vessel ischemic changes within the cerebral white matter, stable. Redemonstrated tiny chronic lacunar infarct within the right cerebellar hemisphere. Mild left ethmoid sinus mucosal thickening. Electronically Signed   By: Kellie Simmering D.O.   On: 05/26/2021 15:34   DG Chest Port 1 View  Result Date: 05/25/2021 CLINICAL DATA:  Weakness. Undergoing chemotherapy for stage IV breast cancer. EXAM: PORTABLE CHEST 1 VIEW COMPARISON:  01/29/2020. Chest, abdomen and pelvis CT dated 03/24/2021. FINDINGS: Normal sized heart. Clear lungs. Left jugular porta catheter tip in the inferior aspect of the superior vena cava near the superior cavoatrial junction. Mild scoliosis. IMPRESSION: No acute abnormality. Electronically Signed   By: Claudie Revering M.D.   On: 05/25/2021 15:41    ASSESSMENT AND PLAN: 1.  Metabolic Encephalopathy, improving 2.  ER/PR positive, HER2 positive metastatic breast cancer 3.  Transaminitis/hyperbilirubinemia likely due to worsening liver metastases 4.  Dehydration 5.  Failure to thrive/protein calorie malnutrition 6.  History of left upper extremity DVT 7.  Hypertension  Plan -will cut lactulose to once daily due to her diarrhea  -will add megace for her appetite  -pt would like to go home and she has strong family support. I spoke with her niece Donneta Romberg today, and she would like to  hold hospice home care for now, but would like to have palliative care at home. We again reviewed that her overall prognosis is still poor, but we will see how much she recovers to see if she is still a candidate for cancer treatment. I will f/u er in office.  -discharge plan per primary team. I appreciate their excellent care. I communicated with Dr. Erlinda Hong today.   [name]  05/29/2021

## 2021-05-30 DIAGNOSIS — G9340 Encephalopathy, unspecified: Secondary | ICD-10-CM | POA: Diagnosis not present

## 2021-05-30 LAB — CULTURE, BLOOD (ROUTINE X 2)
Culture: NO GROWTH
Culture: NO GROWTH
Special Requests: ADEQUATE
Special Requests: ADEQUATE

## 2021-05-30 LAB — CBC
HCT: 31.5 % — ABNORMAL LOW (ref 36.0–46.0)
Hemoglobin: 10.7 g/dL — ABNORMAL LOW (ref 12.0–15.0)
MCH: 28.8 pg (ref 26.0–34.0)
MCHC: 34 g/dL (ref 30.0–36.0)
MCV: 84.7 fL (ref 80.0–100.0)
Platelets: 208 10*3/uL (ref 150–400)
RBC: 3.72 MIL/uL — ABNORMAL LOW (ref 3.87–5.11)
RDW: 18 % — ABNORMAL HIGH (ref 11.5–15.5)
WBC: 6.4 10*3/uL (ref 4.0–10.5)
nRBC: 0.5 % — ABNORMAL HIGH (ref 0.0–0.2)

## 2021-05-30 LAB — COMPREHENSIVE METABOLIC PANEL
ALT: 101 U/L — ABNORMAL HIGH (ref 0–44)
AST: 188 U/L — ABNORMAL HIGH (ref 15–41)
Albumin: 2.9 g/dL — ABNORMAL LOW (ref 3.5–5.0)
Alkaline Phosphatase: 301 U/L — ABNORMAL HIGH (ref 38–126)
Anion gap: 8 (ref 5–15)
BUN: 10 mg/dL (ref 8–23)
CO2: 18 mmol/L — ABNORMAL LOW (ref 22–32)
Calcium: 8.6 mg/dL — ABNORMAL LOW (ref 8.9–10.3)
Chloride: 104 mmol/L (ref 98–111)
Creatinine, Ser: 0.84 mg/dL (ref 0.44–1.00)
GFR, Estimated: >60 mL/min (ref >60)
Glucose, Bld: 108 mg/dL — ABNORMAL HIGH (ref 70–99)
Potassium: 2.9 mmol/L — ABNORMAL LOW (ref 3.5–5.1)
Sodium: 130 mmol/L — ABNORMAL LOW (ref 135–145)
Total Bilirubin: 2.6 mg/dL — ABNORMAL HIGH (ref 0.3–1.2)
Total Protein: 6.6 g/dL (ref 6.5–8.1)

## 2021-05-30 LAB — MAGNESIUM: Magnesium: 1.9 mg/dL (ref 1.7–2.4)

## 2021-05-30 LAB — AMMONIA: Ammonia: 51 umol/L — ABNORMAL HIGH (ref 9–35)

## 2021-05-30 MED ORDER — SODIUM BICARBONATE 650 MG PO TABS
650.0000 mg | ORAL_TABLET | Freq: Three times a day (TID) | ORAL | Status: DC
  Administered 2021-05-30 – 2021-05-31 (×4): 650 mg via ORAL
  Filled 2021-05-30 (×4): qty 1

## 2021-05-30 MED ORDER — ALUM & MAG HYDROXIDE-SIMETH 200-200-20 MG/5ML PO SUSP
30.0000 mL | ORAL | Status: DC | PRN
  Administered 2021-05-30: 30 mL via ORAL

## 2021-05-30 MED ORDER — POTASSIUM CHLORIDE CRYS ER 20 MEQ PO TBCR
40.0000 meq | EXTENDED_RELEASE_TABLET | ORAL | Status: AC
  Administered 2021-05-30 (×2): 40 meq via ORAL
  Filled 2021-05-30 (×2): qty 2

## 2021-05-30 MED ORDER — LACTULOSE 10 GM/15ML PO SOLN
30.0000 g | Freq: Every day | ORAL | Status: DC
  Administered 2021-05-31: 30 g via ORAL
  Filled 2021-05-30: qty 45

## 2021-05-30 MED ORDER — LACTULOSE 10 GM/15ML PO SOLN
20.0000 g | Freq: Once | ORAL | Status: AC
  Administered 2021-05-30: 20 g via ORAL
  Filled 2021-05-30: qty 30

## 2021-05-30 NOTE — Progress Notes (Signed)
PROGRESS NOTE    Teresa Hays  YOV:[id number] DOB: 1955-07-22 DOA: 05/25/2021 PCP: [name], DO    Chief Complaint  Patient presents with   Weakness    Brief Narrative:  Teresa Hays is a 65 y.o. 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 Burr Medico input appreciated  Left arm per extremity DVT On Eliquis 2.5 mg daily, change to subQ  lovenox due to unreliable oral intake Plan to transition back to Eliquis upon discharge  FTT: Evaluated by physical therapy, recommend home health PT, home DME, order placed   Nutritional Assessment: The patient's BMI is: Body mass index is 24.86 kg/m.Marland Kitchen        Unresulted Labs (From admission, onward)     Start     Ordered   05/31/21 0500  Ammonia  Tomorrow morning,   R       Question:  Specimen collection method  Answer:  Unit=Unit collect    05/30/21 1406   05/27/21 0500  Comprehensive metabolic panel  Daily,   R      05/26/21 1258   05/27/21 0500  CBC  Daily,   R      05/26/21 1258   05/26/21 1318  Methylmalonic acid, serum  ONCE - STAT,   STAT        05/26/21 1318              DVT prophylaxis: on full anticoagulation    Code Status: DNR Family Communication: sister at bedside  Disposition:   Status is: Inpatient  Dispo: The patient is from: Home              Anticipated d/c is to: Home with home health, outpatient palliative care              Anticipated d/c date is: Possible tomorrow, if she continues to improve                Consultants:  Hematology oncology  Procedures:  None  Antimicrobials:   Anti-infectives (From admission, onward)    None           Objective: Vitals:   05/29/21 0451 05/29/21 2219 05/30/21 0526 05/30/21 1349  BP: 108/82 120/84 (!) 125/95 (!) 120/91  Pulse: 92 97 (!) 103 (!) 102  Resp: 17 17 17 16   Temp: 98.7 F (37.1 C) 98.5 F (36.9 C) 98.2 F (36.8 C) 98.1 F (36.7 C)  TempSrc: Oral Oral Oral Oral  SpO2: 97% 96% 99% 100%  Weight:      Height:        Intake/Output Summary (Last 24 hours) at 05/30/2021 1411 Last data filed at 05/30/2021 1304 Gross per 24 hour  Intake 4084 ml  Output --  Net 4084 ml   Filed Weights   05/25/21 1512 05/28/21 0400  Weight: 63.5 kg 65.7 kg    Examination:  General exam: slightly stronger, confusion appear has resolved, she is AAOx3  Respiratory system:  diminished, respiratory effort normal. Cardiovascular system:  RRR.  Gastrointestinal system:  Normal bowel sounds. Central nervous system: AAO x3, no focal deficit Extremities:  no edema Skin: No rashes, lesions or ulcers Psychiatry: Pleasant    Data Reviewed: I have personally reviewed following labs and imaging studies  CBC: Recent Labs  Lab 05/25/21 1530 05/26/21 0514 05/27/21 0517 05/28/21 0514 05/29/21 0529 05/30/21 0425  WBC 5.8 8.2 8.2 8.2  7.6 6.4  NEUTROABS 3.5  --   --   --   --   --   HGB 12.5 12.5 12.0 11.2* 10.6* 10.7*  HCT 37.2 37.0 36.0 33.4* 30.7* 31.5*  MCV 84.7 84.3 85.9 84.6 83.2 84.7  PLT 225 196 183 156 162 096    Basic Metabolic Panel: Recent Labs  Lab 05/26/21 0514 05/27/21 0517 05/28/21 0514 05/29/21 0529 05/30/21 0425  NA 130* 131* 133* 133* 130*  K 5.1 4.4 4.0 3.2* 2.9*  CL 100 102 105 103 104  CO2 18* 20* 20* 20* 18*  GLUCOSE 96 116* 93 93 108*  BUN 25* 16 18 14 10   CREATININE 1.49* 1.07* 0.96 0.76 0.84  CALCIUM 9.4 8.9 8.7* 8.8* 8.6*  MG  --   --   --  2.0 1.9    GFR: Estimated Creatinine Clearance: 63.1 mL/min (by C-G formula based on SCr of 0.84 mg/dL).  Liver Function Tests: Recent Labs  Lab 05/26/21 0514 05/27/21 0517 05/28/21 0514 05/29/21 0529 05/30/21 0425  AST 548* 462* 394* 243* 188*  ALT 159* 177* 147* 117* 101*  ALKPHOS 335* 295* 287* 300* 301*  BILITOT 2.8* 2.8* 2.9* 2.8* 2.6*  PROT 6.9 6.8 6.8 6.6 6.6  ALBUMIN 3.3* 3.1* 3.1* 2.9* 2.9*    CBG: Recent Labs  Lab 05/27/21 0233  GLUCAP 82     Recent Results (from the past 240 hour(s))  Resp Panel by RT-PCR (Flu A&B, Covid) Nasopharyngeal Swab     Status: None   Collection Time: 05/25/21  3:30 PM   Specimen: Nasopharyngeal Swab; Nasopharyngeal(NP) swabs in vial transport medium  Result Value Ref Range Status   SARS Coronavirus 2 by RT PCR NEGATIVE NEGATIVE Final    Comment: (NOTE) SARS-CoV-2 target nucleic acids are NOT DETECTED.  The SARS-CoV-2 RNA is generally detectable in upper respiratory specimens during the acute phase of infection. The lowest concentration of SARS-CoV-2 viral copies this assay can detect is 138 copies/mL. A negative result does not preclude SARS-Cov-2 infection and should not be used as the sole basis for treatment or other patient management decisions. A negative result may occur with  improper specimen collection/handling, submission of specimen other than nasopharyngeal swab,  presence of viral mutation(s) within the areas targeted by this assay, and inadequate number of viral copies(<138 copies/mL). A negative result must be combined with clinical observations, patient history, and epidemiological information. The expected result is Negative.  Fact Sheet for Patients:  EntrepreneurPulse.com.au  Fact Sheet for Healthcare Providers:  IncredibleEmployment.be  This test is no t yet approved or  cleared by the [place] and  has been authorized for detection and/or diagnosis of SARS-CoV-2 by FDA under an Emergency Use Authorization (EUA). This EUA will remain  in effect (meaning this test can be used) for the duration of the COVID-19 declaration under Section 564(b)(1) of the Act, 21 U.S.C.section 360bbb-3(b)(1), unless the authorization is terminated  or revoked sooner.       Influenza A by PCR NEGATIVE NEGATIVE Final   Influenza B by PCR NEGATIVE NEGATIVE Final    Comment: (NOTE) The Xpert Xpress SARS-CoV-2/FLU/RSV plus assay is intended as an aid in the diagnosis of influenza from Nasopharyngeal swab specimens and should not be used as a sole basis for treatment. Nasal washings and aspirates are unacceptable for Xpert Xpress SARS-CoV-2/FLU/RSV testing.  Fact Sheet for Patients: EntrepreneurPulse.com.au  Fact Sheet for Healthcare Providers: IncredibleEmployment.be  This test is not yet approved or cleared by the [place] FDA and has been authorized for detection and/or diagnosis of SARS-CoV-2 by FDA under an Emergency Use Authorization (EUA). This EUA will remain in effect (meaning this test can be used) for the duration of the COVID-19 declaration under Section 564(b)(1) of the Act, 21 U.S.C. section 360bbb-3(b)(1), unless the authorization is terminated or revoked.  Performed at [facility], [place] [address], [place], [place] [address]    Culture, blood (routine x 2)     Status: None   Collection Time: 05/25/21  3:35 PM   Specimen: BLOOD  Result Value Ref Range Status   Specimen Description   Final    BLOOD PORTA CATH Performed at [facility] [address] [place], [place] [address]    Special Requests   Final    BOTTLES DRAWN AEROBIC AND ANAEROBIC Blood Culture adequate volume Performed at [facility] [address], [place], [place] [address]    Culture   Final    NO GROWTH 5 DAYS Performed at [facility], [place] [address], [place], [place] [address]    Report Status 05/30/2021 FINAL  Final  Culture, blood (routine x 2)     Status: None   Collection Time: 05/25/21  3:38 PM   Specimen: BLOOD RIGHT HAND  Result Value Ref Range Status   Specimen Description   Final    BLOOD RIGHT HAND Performed at [facility] [address] [place], [place] [address]    Special Requests   Final    BOTTLES DRAWN AEROBIC AND ANAEROBIC Blood Culture adequate volume Performed at [facility] [address] [place], [place] [address]    Culture   Final    NO GROWTH 5 DAYS Performed at [facility], [place] [address] [place], [place] [address]    Report Status 05/30/2021 FINAL  Final  Gastrointestinal Panel by PCR , Stool     Status: None   Collection Time: 05/28/21 10:14 AM   Specimen: Stool  Result Value Ref Range Status   Campylobacter species NOT DETECTED NOT DETECTED Final   Plesimonas shigelloides NOT DETECTED NOT DETECTED Final   Salmonella species NOT DETECTED NOT DETECTED Final   Yersinia enterocolitica NOT DETECTED NOT DETECTED Final   Vibrio species NOT DETECTED NOT DETECTED Final   Vibrio cholerae NOT DETECTED NOT DETECTED Final   Enteroaggregative E coli (EAEC) NOT DETECTED NOT DETECTED Final   Enteropathogenic E coli (EPEC) NOT DETECTED NOT DETECTED Final   Enterotoxigenic E coli (ETEC) NOT DETECTED NOT DETECTED Final  Shiga like toxin producing E coli (STEC) NOT DETECTED NOT DETECTED Final   Shigella/Enteroinvasive E coli (EIEC) NOT DETECTED NOT DETECTED Final   Cryptosporidium NOT DETECTED NOT DETECTED Final   Cyclospora cayetanensis NOT DETECTED NOT DETECTED Final   Entamoeba histolytica NOT DETECTED NOT DETECTED Final   Giardia lamblia NOT DETECTED NOT DETECTED Final   Adenovirus F40/41 NOT DETECTED NOT DETECTED Final   Astrovirus NOT DETECTED NOT DETECTED Final   Norovirus GI/GII NOT DETECTED NOT DETECTED Final   Rotavirus A NOT DETECTED NOT DETECTED Final   Sapovirus (I, II, IV, and V) NOT DETECTED NOT DETECTED Final    Comment: Performed at [facility], [address] [place], [place] [address]         Radiology Studies: No results found.      Scheduled Meds:  Chlorhexidine Gluconate Cloth  6 each Topical Daily   DULoxetine  20 mg Oral Daily   enoxaparin (LOVENOX) injection  1 mg/kg Subcutaneous Q12H   feeding supplement  237 mL Oral TID BM   gabapentin  300 mg Oral TID   lactulose  20 g Oral Once   [START ON 05/31/2021] lactulose  30 g Oral Daily   megestrol  600 mg Oral Daily   montelukast  10 mg Oral QHS   multivitamin with minerals  1 tablet Oral Daily   potassium chloride  40 mEq Oral Q4H   sodium bicarbonate  650 mg Oral TID   Continuous Infusions:  sodium chloride Stopped (05/25/21 1740)   sodium chloride 75 mL/hr at 05/29/21 0510     LOS: 5 days   Time spent: 25 mins Greater than 50% of this time was spent in counseling, explanation of diagnosis, planning of further management, and coordination of care.   Voice Recognition Viviann Spare dictation system was used to create this note, attempts have been made to correct errors. Please contact the author with questions and/or clarifications.   Florencia Reasons, MD PhD FACP [facility]  Available via Epic secure chat 7am-7pm for nonurgent issues Please page for urgent issues To page the attending provider  between 7A-7P or the covering provider during after hours 7P-7A, please log into the web site [email or web address] and access using universal [place] password for that web site. If you do not have the password, please call the hospital operator.    05/30/2021, 2:11 PM

## 2021-05-31 DIAGNOSIS — E860 Dehydration: Secondary | ICD-10-CM

## 2021-05-31 DIAGNOSIS — C50919 Malignant neoplasm of unspecified site of unspecified female breast: Secondary | ICD-10-CM | POA: Diagnosis not present

## 2021-05-31 DIAGNOSIS — G9340 Encephalopathy, unspecified: Secondary | ICD-10-CM | POA: Diagnosis not present

## 2021-05-31 DIAGNOSIS — N179 Acute kidney failure, unspecified: Secondary | ICD-10-CM | POA: Diagnosis not present

## 2021-05-31 LAB — COMPREHENSIVE METABOLIC PANEL
ALT: 77 U/L — ABNORMAL HIGH (ref 0–44)
AST: 138 U/L — ABNORMAL HIGH (ref 15–41)
Albumin: 2.7 g/dL — ABNORMAL LOW (ref 3.5–5.0)
Alkaline Phosphatase: 283 U/L — ABNORMAL HIGH (ref 38–126)
Anion gap: 6 (ref 5–15)
BUN: 8 mg/dL (ref 8–23)
CO2: 21 mmol/L — ABNORMAL LOW (ref 22–32)
Calcium: 8.4 mg/dL — ABNORMAL LOW (ref 8.9–10.3)
Chloride: 107 mmol/L (ref 98–111)
Creatinine, Ser: 0.74 mg/dL (ref 0.44–1.00)
GFR, Estimated: >60 mL/min (ref >60)
Glucose, Bld: 88 mg/dL (ref 70–99)
Potassium: 3.3 mmol/L — ABNORMAL LOW (ref 3.5–5.1)
Sodium: 134 mmol/L — ABNORMAL LOW (ref 135–145)
Total Bilirubin: 2.5 mg/dL — ABNORMAL HIGH (ref 0.3–1.2)
Total Protein: 6.1 g/dL — ABNORMAL LOW (ref 6.5–8.1)

## 2021-05-31 LAB — CBC
HCT: 29.5 % — ABNORMAL LOW (ref 36.0–46.0)
Hemoglobin: 10.2 g/dL — ABNORMAL LOW (ref 12.0–15.0)
MCH: 28.6 pg (ref 26.0–34.0)
MCHC: 34.6 g/dL (ref 30.0–36.0)
MCV: 82.6 fL (ref 80.0–100.0)
Platelets: 187 10*3/uL (ref 150–400)
RBC: 3.57 MIL/uL — ABNORMAL LOW (ref 3.87–5.11)
RDW: 17.6 % — ABNORMAL HIGH (ref 11.5–15.5)
WBC: 7.5 10*3/uL (ref 4.0–10.5)
nRBC: 0.3 % — ABNORMAL HIGH (ref 0.0–0.2)

## 2021-05-31 LAB — MAGNESIUM: Magnesium: 1.8 mg/dL (ref 1.7–2.4)

## 2021-05-31 LAB — AMMONIA: Ammonia: 57 umol/L — ABNORMAL HIGH (ref 9–35)

## 2021-05-31 LAB — METHYLMALONIC ACID, SERUM: Methylmalonic Acid, Quantitative: 83 nmol/L (ref 0–378)

## 2021-05-31 MED ORDER — ADULT MULTIVITAMIN W/MINERALS CH: 1.0000 | ORAL_TABLET | Freq: Every day | ORAL | 0 refills | Status: DC

## 2021-05-31 MED ORDER — HEPARIN SOD (PORK) LOCK FLUSH 100 UNIT/ML IV SOLN
500.0000 [IU] | INTRAVENOUS | Status: AC | PRN
  Administered 2021-05-31: 500 [IU]

## 2021-05-31 MED ORDER — MAGNESIUM SULFATE IN D5W 1-5 GM/100ML-% IV SOLN
1.0000 g | Freq: Once | INTRAVENOUS | Status: AC
  Administered 2021-05-31: 1 g via INTRAVENOUS
  Filled 2021-05-31: qty 100

## 2021-05-31 MED ORDER — MEGESTROL ACETATE 400 MG/10ML PO SUSP: 600.0000 mg | Freq: Every day | ORAL | 0 refills | Status: DC

## 2021-05-31 MED ORDER — ENSURE ENLIVE PO LIQD: 237.0000 mL | Freq: Three times a day (TID) | ORAL | 12 refills | Status: DC

## 2021-05-31 MED ORDER — SODIUM BICARBONATE 650 MG PO TABS
650.0000 mg | ORAL_TABLET | Freq: Two times a day (BID) | ORAL | 0 refills | Status: AC
Start: 2021-05-31 — End: 2021-06-03

## 2021-05-31 MED ORDER — LACTULOSE 10 GM/15ML PO SOLN: 30.0000 g | Freq: Every day | ORAL | 0 refills | Status: AC

## 2021-05-31 MED ORDER — DULOXETINE HCL 20 MG PO CPEP: 20.0000 mg | ORAL_CAPSULE | Freq: Every day | ORAL | 0 refills | Status: DC

## 2021-05-31 MED ORDER — LORAZEPAM 0.5 MG PO TABS: 0.5000 mg | ORAL_TABLET | Freq: Three times a day (TID) | ORAL | 0 refills | Status: DC | PRN

## 2021-05-31 MED ORDER — CARVEDILOL 3.125 MG PO TABS
3.1250 mg | ORAL_TABLET | Freq: Once | ORAL | Status: AC
  Administered 2021-05-31: 3.125 mg via ORAL
  Filled 2021-05-31: qty 1

## 2021-05-31 MED ORDER — GABAPENTIN 300 MG PO CAPS: 300.0000 mg | ORAL_CAPSULE | Freq: Three times a day (TID) | ORAL | 0 refills | Status: DC

## 2021-05-31 MED ORDER — POTASSIUM CHLORIDE CRYS ER 20 MEQ PO TBCR
40.0000 meq | EXTENDED_RELEASE_TABLET | Freq: Once | ORAL | Status: AC
  Administered 2021-05-31: 40 meq via ORAL
  Filled 2021-05-31: qty 2

## 2021-05-31 NOTE — TOC Progression Note (Signed)
Transition of Care [facility]) - Progression Note    Patient Details  Name: Teresa Hays MRN: [id number] Date of Birth: 1956/01/26  [facility] CM/SW Contact  Joanne Chars, LCSW Phone Number: 05/31/2021, 10:23 AM  Clinical Narrative:   Pt discharging home with [facility].  [facility] palliative care also will follow.  3n1 and rollator provided by [employer], verified that this DME is in room.  Pt in room with family who will transport home.  Readmission risk items also addressed, unable to make hospital discharge appt on weekend but pt will follow up.   CSW informed Alvis Lemmings and [facility] of pt discharge today.      Expected Discharge Plan: [place] Barriers to Discharge: Barriers Resolved  Expected Discharge Plan and Services Expected Discharge Plan: [place]   Discharge Planning Services: CM Consult Post Acute Care Choice: Home Health   Expected Discharge Date: 05/31/21               DME Arranged: Gilford Rile rolling with seat, 3-N-1 DME Agency: [employer] Date DME Agency Contacted: 05/29/21   Representative spoke with at DME Agency: [place]: PT [place]: [place] Date [place]: 05/29/21 Time [place]: [id number] Representative spoke with at [place]: [place] ([place]) Interventions    Readmission Risk Interventions Readmission Risk Prevention Plan 05/29/2021  Transportation Screening Complete  PCP or Specialist Appt within 3-5 Days Complete  HRI or [place] Complete  Social Work Consult for [place] Planning/Counseling Complete  Palliative Care Screening Complete  Medication Review Press photographer) Complete  Some recent data might be hidden

## 2021-05-31 NOTE — Discharge Summary (Signed)
Discharge Summary  Teresa Hays XLK:[id number] DOB: 06/24/1956  PCP: [name], DO  Admit date: 05/25/2021 Discharge date: 05/31/2021  Time spent: 32mns, more than 50% time spent on coordination of care.  Recommendations for Outpatient Follow-up:  F/u with pcp F/u with oncology Dr FBurr Medicowithin a week  for hospital discharge follow up, repeat cbc/bmp at follow up F/u with cardiooncology Dr BMissy Sabinsas scheduled    Discharge Diagnoses:  Active Hospital Problems   Diagnosis Date Noted   Dehydration    [place]) [place] 05/25/2021    Resolved Hospital Problems  No resolved problems to display.    Discharge Condition: stable  Diet recommendation: regular diet  Filed Weights   05/25/21 1512 05/28/21 0400  Weight: 63.5 kg 65.7 kg    History of present illness: ( per admitting MD Dr WDurward Fortes Chief Complaint: Failure to thrive   HPI: Teresa BLOODSWORTHis a 65y.o. 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 intake mental status fatigue weakness.  Patient follows closely with Dr. FBurr Medicooncology, most recent visit just last week for similar episode of worsening p.o. intake malaise and questionable syncope.  Worsening jaundice nausea per family with brought to the ED for further evaluation and treatment.   In the ED patient was noted to have multiple lab abnormalities consistent with dehydration and likely worsening metastatic disease with new liver mets given hyponatremia hyperkalemia elevated creatinine BUN AST ALT alk phos bilirubin and hypoalbuminemia.  Patient admitted for IV fluids and supportive care, will need close monitoring until oncology and palliative care can evaluate the patient for goals of care discussion in the  next 24 to 48 hours.  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, cannot rule out leptomeningeal disease, though MRI of the brain did not show acute changes Neurontin dose reduced  She continue to improve, adjust lactulose, goal of BM 3-4 every 24 hours She desires to go home, at discharge she is fully alert, aaox3       AKI on CKD II -likely prerenal -continue hold lisinopril and spironolactone -she received hydration  -improved, creatinine normalized   Metabolic acidosis, improved on sodium bicarb supplement, discharge on three more days, repeat bmp at hospital discharge follow up to determine further supplement needs on outpatient basis   Hyponatremia Likely from dehydration, hold spironolactone She received hydration Na 129 on presentation, Na 134 at discharge Encourage oral intake Repeat bmp next week     Hypokalemia, received k supplement, discharged on k supplement Repeat bmp next week   Metastatic right breast cancer with mets to the liver Oncology Dr. FBurr Medicoinput appreciated   Left arm per extremity DVT On Eliquis 2.5 mg daily, change to subQ  lovenox in the hospital due to unreliable oral intake transition back to Eliquis upon discharge  HTN/possibe mild herceptin induced cardiomyopathy Followed by Dr BMissy SabinsShe presents with dehydration, home meds coreg/lisinopril/spironolactone held in the hospital she appear euvolemic at discharge, sbp atound 110 -120 Resume coreg, continue hold lisinorpil/spironolactone Outpatient follow up    FTT: Evaluated by physical therapy, recommend home health PT, home DME, order placed  Nutritional Assessment: The patient's BMI is: Body mass index is 24.86  kg/m.Marland Kitchen    Procedures: none  Consultations: Oncology Dr Burr Medico   Discharge Exam: BP 120/84 (BP Location: Right Arm)   Pulse (!) 108   Temp 99.4 F (37.4 C) (Oral)   Resp 18   Ht _0  (1.626 m)   Wt 65.7 kg   SpO2 96%   BMI 24.86 kg/m   General: NAD, pleseant, aaox3 Cardiovascular: RRR Respiratory: normal respiratory effort   Discharge Instructions You were cared for by a hospitalist during your hospital stay. If you have any questions about your discharge medications or the care you received while you were in the hospital after you are discharged, you can call the unit and asked to speak with the hospitalist on call if the hospitalist that took care of you is not available. Once you are discharged, your primary care physician will handle any further medical issues. Please note that NO REFILLS for any discharge medications will be authorized once you are discharged, as it is imperative that you return to your primary care physician (or establish a relationship with a primary care physician if you do not have one) for your aftercare needs so that they can reassess your need for medications and monitor your lab values.  Discharge Instructions     Diet general   Complete by: As directed    Increase activity slowly   Complete by: As directed       Allergies as of 05/31/2021   No Known Allergies      Medication List     STOP taking these medications    amLODipine 5 MG tablet Commonly known as: NORVASC   capecitabine 500 MG tablet Commonly known as: XELODA   diphenoxylate-atropine 2.5-0.025 MG tablet Commonly known as: LOMOTIL   losartan 50 MG tablet Commonly known as: COZAAR   Shingrix injection Generic drug: Zoster Vaccine Adjuvanted   spironolactone 25 MG tablet Commonly known as: ALDACTONE   Tukysa 150 MG tablet Generic drug: tucatinib       TAKE these medications    Breztri Aerosphere 160-9-4.8 MCG/ACT Aero Generic drug:  Budeson-Glycopyrrol-Formoterol INHALE 2 PUFFS BY MOUTH INTO THE LUNGS IN THE MORNING AND AT BEDTIME. What changed:  how much to take how to take this when to take this reasons to take this   calcium carbonate 1500 (600 Ca) MG Tabs tablet Commonly known as: OSCAL Take 600 mg of elemental calcium by mouth daily.   carvedilol 3.125 MG tablet Commonly known as: COREG TAKE 1 TABLET BY MOUTH TWICE DAILY. REPLACES ATENOLOL What changed:  how much to take how to take this when to take this   DULoxetine 20 MG capsule Commonly known as: CYMBALTA Take 1 capsule (20 mg total) by mouth at bedtime. What changed:  how much to take when to take this   Eliquis 2.5 MG Tabs tablet Generic drug: apixaban Take 1 tablet (2.5 mg total) by mouth 2 (two) times daily.   famotidine 10 MG tablet Commonly known as: PEPCID Take 10 mg by mouth daily as needed for heartburn or indigestion.   feeding supplement Liqd Take 237 mLs by mouth 3 (three) times daily between meals.   gabapentin 300 MG capsule Commonly known as: NEURONTIN Take 1 capsule (300 mg total) by mouth 3 (three) times daily. What changed:  how much to take how to take this when to take this   lactulose 10 GM/15ML solution Commonly known as: CHRONULAC Take 45 mLs (  30 g total) by mouth daily.   lidocaine 2 % jelly Commonly known as: XYLOCAINE APPLY OVER PORT A CATH TOPICALLY 1 HOUR BEFORE PORT BEING ACCESSED AS NEEDED What changed:  how much to take how to take this when to take this reasons to take this   LORazepam 0.5 MG tablet Commonly known as: ATIVAN Take 1 tablet (0.5 mg total) by mouth every 8 (eight) hours as needed for anxiety.   meclizine 25 MG tablet Commonly known as: ANTIVERT TAKE 1/2 TABLET BY MOUTH 3 TIMES DAILY AS NEEDED FOR DIZZINESS What changed:  how much to take how to take this when to take this reasons to take this   megestrol 400 MG/10ML suspension Commonly known as: MEGACE Take 15 mLs  (600 mg total) by mouth daily.   montelukast 10 MG tablet Commonly known as: SINGULAIR TAKE 1 TABLET (10 MG TOTAL) BY MOUTH AT BEDTIME. What changed: how much to take   multivitamin with minerals Tabs tablet Take 1 tablet by mouth daily.   ondansetron 8 MG tablet Commonly known as: ZOFRAN TAKE 1 TABLET BY MOUTH EVERY 8 HOURS AS NEEDED FOR NAUSEA OR VOMITING What changed:  how much to take reasons to take this   potassium chloride SA 20 MEQ tablet Commonly known as: KLOR-CON TAKE 1 TABLET BY MOUTH ONCE A DAY What changed: how much to take   prochlorperazine 10 MG tablet Commonly known as: COMPAZINE TAKE 1 TABLET BY MOUTH EVERY 6 HOURS AS NEEDED FOR NAUSEA OR VOMITING What changed:  how much to take reasons to take this   sodium bicarbonate 650 MG tablet Take 1 tablet (650 mg total) by mouth 2 (two) times daily for 3 days.   traMADol 50 MG tablet Commonly known as: ULTRAM Take 1 tablet (50 mg total) by mouth every 6 (six) hours as needed for moderate pain or severe pain.       No Known Allergies  Follow-up Information     [facility] Follow up.   Specialty: Home Health Services Why: For home physical therapy Contact information: [place] [address] [phone]         [facility] Follow up.   Specialty: Hospice and Palliative Medicine Why: Home palliative services Contact information: [place] [address] [phone]        [name], DO Follow up.   Specialty: Family Medicine Why: Please follow up with your PCP and schedule an appointment within one week. Contact information: [address] [place] [address] [phone]                  The results of significant diagnostics from this hospitalization (including imaging, microbiology, ancillary and laboratory) are listed below for reference.    Significant Diagnostic Studies: CT Head Wo  Contrast  Result Date: 05/25/2021 CLINICAL DATA:  Altered mental status. Undergoing chemotherapy for stage IV breast cancer. EXAM: CT HEAD WITHOUT CONTRAST TECHNIQUE: Contiguous axial images were obtained from the base of the skull through the vertex without intravenous contrast. COMPARISON:  Brain MR dated 01/06/2021.  Head CT dated 10/25/2005. FINDINGS: Brain: Normal appearing cerebral hemispheres and posterior fossa structures. Normal size and position of the ventricles. No intracranial hemorrhage, mass lesion or CT evidence of acute infarction. Vascular: No hyperdense vessel or unexpected calcification. Skull: Normal. Negative for fracture or focal lesion. Sinuses/Orbits: No acute finding. Other: None. IMPRESSION: No acute abnormality. Electronically Signed   By: Claudie Revering  M.D.   On: 05/25/2021 16:05   MR BRAIN W WO CONTRAST  Result Date: 05/26/2021 CLINICAL DATA:  Mental status change, persistent or worsening. Additional history provided: Metastatic breast cancer, recent mental status change, evaluate for possible stroke versus metastases. EXAM: MRI HEAD WITHOUT AND WITH CONTRAST TECHNIQUE: Multiplanar, multiecho pulse sequences of the brain and surrounding structures were obtained without and with intravenous contrast. CONTRAST:  34m GADAVIST GADOBUTROL 1 MMOL/ML IV SOLN COMPARISON:  Prior head CT examinations 05/25/2021 and earlier. Brain MRI 01/06/2021. FINDINGS: Brain: Mild intermittent motion degradation. Cerebral volume is within normal limits for age. Mild multifocal T2 FLAIR hyperintense signal abnormality within the cerebral white matter, nonspecific but compatible with chronic small vessel ischemic disease. Redemonstrated tiny chronic lacunar infarct within the right cerebellar hemisphere (series 8, image 7). There is no acute infarct. No evidence of an intracranial mass. No chronic intracranial blood products. No extra-axial fluid collection. No midline shift. Incidentally noted cavum septum  pellucidum and cavum vergae. No pathologic intracranial enhancement is identified to suggest intracranial metastatic disease. Vascular: Maintained flow voids within the proximal large arterial vessels. Skull and upper cervical spine: No focal suspicious marrow lesion. Incompletely assessed cervical spondylosis. Sinuses/Orbits: Visualized orbits show no acute finding. Minimal mucosal thickening within the left ethmoid air cells. IMPRESSION: Mildly motion degraded exam. No evidence of acute intracranial abnormality. No evidence of intracranial metastatic disease. Mild chronic small vessel ischemic changes within the cerebral white matter, stable. Redemonstrated tiny chronic lacunar infarct within the right cerebellar hemisphere. Mild left ethmoid sinus mucosal thickening. Electronically Signed   By: KKellie SimmeringD.O.   On: 05/26/2021 15:34   DG Chest Port 1 View  Result Date: 05/25/2021 CLINICAL DATA:  Weakness. Undergoing chemotherapy for stage IV breast cancer. EXAM: PORTABLE CHEST 1 VIEW COMPARISON:  01/29/2020. Chest, abdomen and pelvis CT dated 03/24/2021. FINDINGS: Normal sized heart. Clear lungs. Left jugular porta catheter tip in the inferior aspect of the superior vena cava near the superior cavoatrial junction. Mild scoliosis. IMPRESSION: No acute abnormality. Electronically Signed   By: SClaudie ReveringM.D.   On: 05/25/2021 15:41    Microbiology: Recent Results (from the past 240 hour(s))  Resp Panel by RT-PCR (Flu A&B, Covid) Nasopharyngeal Swab     Status: None   Collection Time: 05/25/21  3:30 PM   Specimen: Nasopharyngeal Swab; Nasopharyngeal(NP) swabs in vial transport medium  Result Value Ref Range Status   SARS Coronavirus 2 by RT PCR NEGATIVE NEGATIVE Final    Comment: (NOTE) SARS-CoV-2 target nucleic acids are NOT DETECTED.  The SARS-CoV-2 RNA is generally detectable in upper respiratory specimens during the acute phase of infection. The lowest concentration of SARS-CoV-2 viral  copies this assay can detect is 138 copies/mL. A negative result does not preclude SARS-Cov-2 infection and should not be used as the sole basis for treatment or other patient management decisions. A negative result may occur with  improper specimen collection/handling, submission of specimen other than nasopharyngeal swab, presence of viral mutation(s) within the areas targeted by this assay, and inadequate number of viral copies(<138 copies/mL). A negative result must be combined with clinical observations, patient history, and epidemiological information. The expected result is Negative.  Fact Sheet for Patients:  hEntrepreneurPulse.com.au Fact Sheet for Healthcare Providers:  hIncredibleEmployment.be This test is no t yet approved or cleared by the [place] and  has been authorized for detection and/or diagnosis of SARS-CoV-2 by FDA under an Emergency Use Authorization (EUA). This EUA will remain  in effect (meaning this test can be used) for the duration of the COVID-19 declaration under Section 564(b)(1) of the Act, 21 U.S.C.section 360bbb-3(b)(1), unless the authorization is terminated  or revoked sooner.       Influenza A by PCR NEGATIVE NEGATIVE Final   Influenza B by PCR NEGATIVE NEGATIVE Final    Comment: (NOTE) The Xpert Xpress SARS-CoV-2/FLU/RSV plus assay is intended as an aid in the diagnosis of influenza from Nasopharyngeal swab specimens and should not be used as a sole basis for treatment. Nasal washings and aspirates are unacceptable for Xpert Xpress SARS-CoV-2/FLU/RSV testing.  Fact Sheet for Patients: EntrepreneurPulse.com.au  Fact Sheet for Healthcare Providers: IncredibleEmployment.be  This test is not yet approved or cleared by the [place] FDA and has been authorized for detection and/or diagnosis of SARS-CoV-2 by FDA under an Emergency Use Authorization (EUA). This  EUA will remain in effect (meaning this test can be used) for the duration of the COVID-19 declaration under Section 564(b)(1) of the Act, 21 U.S.C. section 360bbb-3(b)(1), unless the authorization is terminated or revoked.  Performed at [facility], [place] [address] [place], [place] [address]   Culture, blood (routine x 2)     Status: None   Collection Time: 05/25/21  3:35 PM   Specimen: BLOOD  Result Value Ref Range Status   Specimen Description   Final    BLOOD PORTA CATH Performed at [facility] [address] [place], [place] [address]    Special Requests   Final    BOTTLES DRAWN AEROBIC AND ANAEROBIC Blood Culture adequate volume Performed at [facility] [address] [place], [place] [address]    Culture   Final    NO GROWTH 5 DAYS Performed at [facility], [place] [address], [place], [place] [address]    Report Status 05/30/2021 FINAL  Final  Culture, blood (routine x 2)     Status: None   Collection Time: 05/25/21  3:38 PM   Specimen: BLOOD RIGHT HAND  Result Value Ref Range Status   Specimen Description   Final    BLOOD RIGHT HAND Performed at [facility] [address] [place], [place] [address]    Special Requests   Final    BOTTLES DRAWN AEROBIC AND ANAEROBIC Blood Culture adequate volume Performed at [facility] [address], [place], [place] [address]    Culture   Final    NO GROWTH 5 DAYS Performed at [facility], [place] [address], [place], [place] [address]    Report Status 05/30/2021 FINAL  Final  Gastrointestinal Panel by PCR , Stool     Status: None   Collection Time: 05/28/21 10:14 AM   Specimen: Stool  Result Value Ref Range Status   Campylobacter species NOT DETECTED NOT DETECTED Final   Plesimonas shigelloides NOT DETECTED NOT DETECTED Final   Salmonella species NOT DETECTED NOT DETECTED Final   Yersinia enterocolitica NOT  DETECTED NOT DETECTED Final   Vibrio species NOT DETECTED NOT DETECTED Final   Vibrio cholerae NOT DETECTED NOT DETECTED Final   Enteroaggregative E coli (EAEC) NOT DETECTED NOT DETECTED Final   Enteropathogenic E coli (EPEC) NOT DETECTED NOT DETECTED Final   Enterotoxigenic E coli (ETEC) NOT DETECTED NOT DETECTED Final   Shiga like toxin producing E coli (STEC) NOT DETECTED NOT DETECTED Final   Shigella/Enteroinvasive E coli (EIEC) NOT DETECTED NOT DETECTED Final   Cryptosporidium NOT DETECTED NOT  DETECTED Final   Cyclospora cayetanensis NOT DETECTED NOT DETECTED Final   Entamoeba histolytica NOT DETECTED NOT DETECTED Final   Giardia lamblia NOT DETECTED NOT DETECTED Final   Adenovirus F40/41 NOT DETECTED NOT DETECTED Final   Astrovirus NOT DETECTED NOT DETECTED Final   Norovirus GI/GII NOT DETECTED NOT DETECTED Final   Rotavirus A NOT DETECTED NOT DETECTED Final   Sapovirus (I, II, IV, and V) NOT DETECTED NOT DETECTED Final    Comment: Performed at [facility], [place] [address] [place], [place] [address]     Labs: Basic Metabolic Panel: Recent Labs  Lab 05/27/21 0517 05/28/21 0514 05/29/21 0529 05/30/21 0425 05/31/21 0441  NA 131* 133* 133* 130* 134*  K 4.4 4.0 3.2* 2.9* 3.3*  CL 102 105 103 104 107  CO2 20* 20* 20* 18* 21*  GLUCOSE 116* 93 93 108* 88  BUN _0 CREATININE 1.07* 0.96 0.76 0.84 0.74  CALCIUM 8.9 8.7* 8.8* 8.6* 8.4*  MG  --   --  2.0 1.9 1.8   Liver Function Tests: Recent Labs  Lab 05/27/21 0517 05/28/21 0514 05/29/21 0529 05/30/21 0425 05/31/21 0441  AST 462* 394* 243* 188* 138*  ALT 177* 147* 117* 101* 77*  ALKPHOS 295* 287* 300* 301* 283*  BILITOT 2.8* 2.9* 2.8* 2.6* 2.5*  PROT 6.8 6.8 6.6 6.6 6.1*  ALBUMIN 3.1* 3.1* 2.9* 2.9* 2.7*   No results for input(s): LIPASE, AMYLASE in the last 168 hours. Recent Labs  Lab 05/26/21 1311 05/26/21 2018 05/29/21 0529 05/30/21 0811 05/31/21 0445  AMMONIA 79* 55* 41* 51* 57*    CBC: Recent Labs  Lab 05/25/21 1530 05/26/21 0514 05/27/21 0517 05/28/21 0514 05/29/21 0529 05/30/21 0425 05/31/21 0441  WBC 5.8   < > 8.2 8.2 7.6 6.4 7.5  NEUTROABS 3.5  --   --   --   --   --   --   HGB 12.5   < > 12.0 11.2* 10.6* 10.7* 10.2*  HCT 37.2   < > 36.0 33.4* 30.7* 31.5* 29.5*  MCV 84.7   < > 85.9 84.6 83.2 84.7 82.6  PLT 225   < > 183 156 162 208 187   < > = values in this interval not displayed.   Cardiac Enzymes: No results for input(s): CKTOTAL, CKMB, CKMBINDEX, TROPONINI in the last 168 hours. BNP: BNP (last 3 results) No results for input(s): BNP in the last 8760 hours.  ProBNP (last 3 results) No results for input(s): PROBNP in the last 8760 hours.  CBG: Recent Labs  Lab 05/27/21 0233  GLUCAP 82       Signed:  Florencia Reasons MD, PhD, FACP  [facility] 05/31/2021, 10:07 PM

## 2021-06-01 ENCOUNTER — Other Ambulatory Visit (HOSPITAL_COMMUNITY): Payer: Self-pay

## 2021-06-02 ENCOUNTER — Telehealth: Payer: Self-pay | Admitting: *Deleted

## 2021-06-02 ENCOUNTER — Telehealth: Payer: Self-pay

## 2021-06-02 NOTE — Telephone Encounter (Signed)
Spoke with patient's sister Rosalyn Gess and scheduled an in-person Palliative Consult for 06/09/21 @ 8:30 AM with Dr. Hollace Kinnier. Documentation will be noted in [place].   COVID screening was negative. No pets in home. Patient lives alone, but family members are rotating staying with her currently.   Consent obtained; updated Outlook/Netsmart/Team List and Epic.   Family is aware they may be receiving a call from provider the day before or day of to confirm appointment.

## 2021-06-02 NOTE — Telephone Encounter (Signed)
Pt niece called stating pt was still having severe acid reflux. Advised to continue pepcid. Also, pt has a boil at the anal area. Per Dr.Feng, advised to use a topical antibiotic ointment for the boil and use neosporin for inflammation. Asked pt if she wanted to be seen sooner than Friday. Pt declined. Advised to call office for any other concerns. Pt niece and pt verbalized understanding

## 2021-06-03 ENCOUNTER — Other Ambulatory Visit (HOSPITAL_COMMUNITY): Payer: Self-pay

## 2021-06-05 ENCOUNTER — Other Ambulatory Visit: Payer: Self-pay | Admitting: Hematology

## 2021-06-05 ENCOUNTER — Inpatient Hospital Stay: Payer: Medicare Other

## 2021-06-05 ENCOUNTER — Other Ambulatory Visit: Payer: Self-pay

## 2021-06-05 ENCOUNTER — Other Ambulatory Visit (HOSPITAL_COMMUNITY): Payer: Self-pay

## 2021-06-05 ENCOUNTER — Encounter: Payer: Self-pay | Admitting: Hematology

## 2021-06-05 ENCOUNTER — Inpatient Hospital Stay (HOSPITAL_BASED_OUTPATIENT_CLINIC_OR_DEPARTMENT_OTHER): Payer: Medicare Other | Admitting: Hematology

## 2021-06-05 DIAGNOSIS — T451X5A Adverse effect of antineoplastic and immunosuppressive drugs, initial encounter: Secondary | ICD-10-CM | POA: Diagnosis not present

## 2021-06-05 DIAGNOSIS — F419 Anxiety disorder, unspecified: Secondary | ICD-10-CM | POA: Diagnosis not present

## 2021-06-05 DIAGNOSIS — R600 Localized edema: Secondary | ICD-10-CM | POA: Diagnosis not present

## 2021-06-05 DIAGNOSIS — Z86718 Personal history of other venous thrombosis and embolism: Secondary | ICD-10-CM | POA: Diagnosis not present

## 2021-06-05 DIAGNOSIS — K2100 Gastro-esophageal reflux disease with esophagitis, without bleeding: Secondary | ICD-10-CM | POA: Diagnosis not present

## 2021-06-05 DIAGNOSIS — C787 Secondary malignant neoplasm of liver and intrahepatic bile duct: Secondary | ICD-10-CM | POA: Diagnosis not present

## 2021-06-05 DIAGNOSIS — I700 Atherosclerosis of aorta: Secondary | ICD-10-CM | POA: Diagnosis not present

## 2021-06-05 DIAGNOSIS — C50919 Malignant neoplasm of unspecified site of unspecified female breast: Secondary | ICD-10-CM | POA: Diagnosis not present

## 2021-06-05 DIAGNOSIS — Z452 Encounter for adjustment and management of vascular access device: Secondary | ICD-10-CM | POA: Diagnosis not present

## 2021-06-05 DIAGNOSIS — K449 Diaphragmatic hernia without obstruction or gangrene: Secondary | ICD-10-CM | POA: Diagnosis not present

## 2021-06-05 DIAGNOSIS — K219 Gastro-esophageal reflux disease without esophagitis: Secondary | ICD-10-CM | POA: Diagnosis not present

## 2021-06-05 DIAGNOSIS — C50811 Malignant neoplasm of overlapping sites of right female breast: Secondary | ICD-10-CM | POA: Diagnosis present

## 2021-06-05 DIAGNOSIS — D6481 Anemia due to antineoplastic chemotherapy: Secondary | ICD-10-CM | POA: Diagnosis not present

## 2021-06-05 DIAGNOSIS — J45909 Unspecified asthma, uncomplicated: Secondary | ICD-10-CM | POA: Diagnosis not present

## 2021-06-05 DIAGNOSIS — E860 Dehydration: Secondary | ICD-10-CM | POA: Diagnosis not present

## 2021-06-05 DIAGNOSIS — Z95828 Presence of other vascular implants and grafts: Secondary | ICD-10-CM

## 2021-06-05 DIAGNOSIS — Z5112 Encounter for antineoplastic immunotherapy: Secondary | ICD-10-CM | POA: Diagnosis present

## 2021-06-05 DIAGNOSIS — D3501 Benign neoplasm of right adrenal gland: Secondary | ICD-10-CM | POA: Diagnosis not present

## 2021-06-05 DIAGNOSIS — C773 Secondary and unspecified malignant neoplasm of axilla and upper limb lymph nodes: Secondary | ICD-10-CM | POA: Diagnosis not present

## 2021-06-05 DIAGNOSIS — G620 Drug-induced polyneuropathy: Secondary | ICD-10-CM | POA: Diagnosis not present

## 2021-06-05 DIAGNOSIS — Z170 Estrogen receptor positive status [ER+]: Secondary | ICD-10-CM | POA: Diagnosis not present

## 2021-06-05 DIAGNOSIS — Z79811 Long term (current) use of aromatase inhibitors: Secondary | ICD-10-CM | POA: Diagnosis not present

## 2021-06-05 DIAGNOSIS — R531 Weakness: Secondary | ICD-10-CM | POA: Diagnosis not present

## 2021-06-05 DIAGNOSIS — Z79899 Other long term (current) drug therapy: Secondary | ICD-10-CM | POA: Diagnosis not present

## 2021-06-05 DIAGNOSIS — Z7901 Long term (current) use of anticoagulants: Secondary | ICD-10-CM | POA: Diagnosis not present

## 2021-06-05 DIAGNOSIS — G9341 Metabolic encephalopathy: Secondary | ICD-10-CM | POA: Diagnosis not present

## 2021-06-05 DIAGNOSIS — R110 Nausea: Secondary | ICD-10-CM | POA: Diagnosis not present

## 2021-06-05 LAB — CMP (CANCER CENTER ONLY)
ALT: 70 U/L — ABNORMAL HIGH (ref 0–44)
AST: 140 U/L — ABNORMAL HIGH (ref 15–41)
Albumin: 2.9 g/dL — ABNORMAL LOW (ref 3.5–5.0)
Alkaline Phosphatase: 416 U/L — ABNORMAL HIGH (ref 38–126)
Anion gap: 9 (ref 5–15)
BUN: 12 mg/dL (ref 8–23)
CO2: 22 mmol/L (ref 22–32)
Calcium: 9.5 mg/dL (ref 8.9–10.3)
Chloride: 104 mmol/L (ref 98–111)
Creatinine: 1 mg/dL (ref 0.44–1.00)
GFR, Estimated: >60 mL/min (ref >60)
Glucose, Bld: 86 mg/dL (ref 70–99)
Potassium: 4.3 mmol/L (ref 3.5–5.1)
Sodium: 135 mmol/L (ref 135–145)
Total Bilirubin: 2.8 mg/dL — ABNORMAL HIGH (ref 0.3–1.2)
Total Protein: 6.6 g/dL (ref 6.5–8.1)

## 2021-06-05 LAB — CBC WITH DIFFERENTIAL (CANCER CENTER ONLY)
Abs Immature Granulocytes: 0.01 10*3/uL (ref 0.00–0.07)
Basophils Absolute: 0 10*3/uL (ref 0.0–0.1)
Basophils Relative: 0 %
Eosinophils Absolute: 0.1 10*3/uL (ref 0.0–0.5)
Eosinophils Relative: 1 %
HCT: 29.5 % — ABNORMAL LOW (ref 36.0–46.0)
Hemoglobin: 10.3 g/dL — ABNORMAL LOW (ref 12.0–15.0)
Immature Granulocytes: 0 %
Lymphocytes Relative: 26 %
Lymphs Abs: 1.8 10*3/uL (ref 0.7–4.0)
MCH: 27.7 pg (ref 26.0–34.0)
MCHC: 34.9 g/dL (ref 30.0–36.0)
MCV: 79.3 fL — ABNORMAL LOW (ref 80.0–100.0)
Monocytes Absolute: 0.5 10*3/uL (ref 0.1–1.0)
Monocytes Relative: 7 %
Neutro Abs: 4.7 10*3/uL (ref 1.7–7.7)
Neutrophils Relative %: 66 %
Platelet Count: 245 10*3/uL (ref 150–400)
RBC: 3.72 MIL/uL — ABNORMAL LOW (ref 3.87–5.11)
RDW: 17.9 % — ABNORMAL HIGH (ref 11.5–15.5)
WBC Count: 7.1 10*3/uL (ref 4.0–10.5)
nRBC: 0 % (ref 0.0–0.2)

## 2021-06-05 MED ORDER — HEPARIN SOD (PORK) LOCK FLUSH 100 UNIT/ML IV SOLN
500.0000 [IU] | Freq: Once | INTRAVENOUS | Status: AC | PRN
  Administered 2021-06-05: 500 [IU]

## 2021-06-05 MED ORDER — SODIUM CHLORIDE 0.9% FLUSH
10.0000 mL | INTRAVENOUS | Status: DC | PRN
  Administered 2021-06-05: 10 mL

## 2021-06-05 MED ORDER — POTASSIUM CHLORIDE CRYS ER 20 MEQ PO TBCR
EXTENDED_RELEASE_TABLET | Freq: Every day | ORAL | 3 refills | Status: DC
  Filled 2021-06-05: qty 30, 30d supply, fill #0

## 2021-06-05 MED ORDER — APIXABAN 2.5 MG PO TABS
2.5000 mg | ORAL_TABLET | Freq: Two times a day (BID) | ORAL | 1 refills | Status: DC
  Filled 2021-06-05: qty 60, 30d supply, fill #0

## 2021-06-05 MED ORDER — FUROSEMIDE 20 MG PO TABS
20.0000 mg | ORAL_TABLET | Freq: Every day | ORAL | 1 refills | Status: AC
  Filled 2021-06-05: qty 10, 10d supply, fill #0

## 2021-06-05 MED ORDER — HEPARIN SOD (PORK) LOCK FLUSH 100 UNIT/ML IV SOLN: 500.0000 [IU] | Freq: Once | INTRAVENOUS | Status: DC | PRN

## 2021-06-05 MED ORDER — MEGESTROL ACETATE 40 MG/ML PO SUSP
600.0000 mg | Freq: Every day | ORAL | 1 refills | Status: DC
  Filled 2021-06-05 (×2): qty 480, 32d supply, fill #0

## 2021-06-05 NOTE — Progress Notes (Signed)
DISCONTINUE ON PATHWAY REGIMEN - Breast     Cycle 1: A cycle is 21 days:     Capecitabine      Tucatinib      Trastuzumab-xxxx    Cycles 2 and beyond: A cycle is every 21 days:     Capecitabine      Tucatinib      Trastuzumab-xxxx   **Always confirm dose/schedule in your pharmacy ordering system**  REASON: Toxicities / Adverse Event PRIOR TREATMENT: [id number]: Capecitabine 1,000 mg/m2 BID D1-14 + Trastuzumab 8/6 mg/kg IV D1 + Tucatinib 300 mg BID D1-21 q21 Days TREATMENT RESPONSE: Unable to Evaluate  START OFF PATHWAY REGIMEN - Breast   [id number]:Ado-Trastuzumab Emtansine 3.6 mg/kg IV D1 q21 Days:   A cycle is every 21 days:     Ado-trastuzumab emtansine   **Always confirm dose/schedule in your pharmacy ordering system**  Patient Characteristics: Distant Metastases or Locoregional Recurrent Disease - Unresected or Locally Advanced Unresectable Disease Progressing after Neoadjuvant and Local Therapies, HER2 Positive, ER Positive, Chemotherapy + HER2-Targeted Therapy, Fourth Line and Beyond, Other Therapeutic Status: Distant Metastases HER2 Status: Positive (+) ER Status: Positive (+) PR Status: Positive (+) Line of Therapy: Fourth Line and Beyond Therapy Indicated: Other Intent of Therapy: Non-Curative / Palliative Intent, Discussed with Patient

## 2021-06-05 NOTE — Addendum Note (Signed)
Addended by: Charmian Muff on: 06/05/2021 01:39 PM   Modules accepted: Orders

## 2021-06-05 NOTE — Progress Notes (Addendum)
[place]   Telephone:([phone] Fax:(336) [phone]   Clinic Follow up Note   Patient Care Team: [name], DO as PCP - General (Family Medicine) [name], MD as Consulting Physician (Gastroenterology) [name], MD as Consulting Physician (Hematology)  Date of Service:  06/05/2021  CHIEF COMPLAINT: f/u of metastatic breast cancer  CURRENT THERAPY:  -Tucatinib and Xeloda started 04/04/21, held due to side effects 04/10/21, restarted Tucatinib on 04/13/21 and low dose Xeloda 05/18/21, held again on 05/23/2021 -Herceptin restarted 04/10/21  ASSESSMENT & PLAN:  Teresa Hays is a 65 y.o. female with   1. Post-hospitalization visit -she complained of nausea and weakness since ~05/19/21 -she was admitted 07/25/49 with metabolic encephalopathy, transaminitis/hyperbilirubinemia, and dehydration. -brain MRI 05/26/21 was negative. -she is slowly recovering-- appetite is improving on megace (she is supplementing with Ensure). I recommend she try to exercise to keep her strength up. -she has residual LE edema. They expressed some confusion with their discharge instructions. I reviewed her medications today and advised her to continue Lasix, which I refilled today.   2. 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 She presented with diffuse liver metastasis, two right breast masses and right axillary adenopathy. Breast mass biopsy showed ER PR positive, but 1 was HER-2 positive, the other one was HER-2 negative. Liver met was HER2-. -She was treated with letrozole, trastuzumab and Perjeta, until disease progression on 07/28/20 CT CAP. -She switched to Enhertu q3weeks starting 08/08/20. Given poor toleration with C1 (nausea, diarrhea, poor appetite), dose reduced with C2 and she tolerated much better.  -restaging CT CAP on 03/24/21 showed progressive hepatic metastasis. -We changed her  treatment to oral Xeloda, tucatinib, and IV trastuzumab. Xeloda dose reduced given her CKD with WCH85. -She began tucatinib and Xeloda on 04/04/21 and developed dizziness and nausea. I held both for the weekend. She restarted her tucatinib on Monday, 04/13/21, at half-dose and increased to full dose 04/20/21.  -she restarted Xeloda at low-dose (500 mg q12hr) on 05/18/21. She again developed nausea and weakness. We will discontinue the medicine. -we discussed options at this point. We can consider additional treatment, such as Kadcyla, I do not think she can tolerate IV chemo giving her low PS. We also discussed continued supportive care alone. She wants to try one more treatment. We will give her the holidays to recover, will obtain a scan, and plan to start treatment after.   2. Syncope, HTN, Headaches -During week 1 of C2 Enhertu she notes episode of syncope.  -She had another episode about a week after C9. Head CT and MRI w/o contrast on 01/29/21 were negative. -intermittent left-sided headaches, Brain MRI 05/26/21 was negative for metastatic disease or acute intracranial abnormality. -she experienced dizziness and nausea after starting tucatinib and Xeloda, as well as brain fog, general weakness, and stiffness.   3. Peripheral neuropathy, grade 2 -Secondary to chemotherapy -I previously stropped Abraxane on 10/13/18.  -She continues on Gabapentin and Cymbalta.    5. Left UE DVT (08/04/18), On Eliquis 2.54m bid indefinitely   6. Anemia, Secondary to chemo -Continue monitoring, will consider blood transfusion if hemoglobin less than 8. -stable at 10.3 today (06/05/21)   7. Goal of Care Discussion, Social support -She lives alone, has her sister and niece in [place] Her daughter lives in [place] She is currently staying with her daughter and her family. -On Ativan PRN due to anxiety about diagnosis and treatment. She is  working with our Education officer, museum to connect with a Social worker. -The patient  understands the goal of care is palliative. -She is full code for now    8. Asthma  -secondary to post nasal drip and her Acid reflux, per Dr. Valeta Harms. Will continue medications. Her breathing has improved and adequate now.   -Continue to f/u with Dr Valeta Harms and Dr Collene Mares.       Plan: -lab, flush, and f/u week of 11/28 -lab, flush, and Kadcyla after next visit  -we reviewed her med list today and clarified her questions  -I refilled Megace and called in lasix for her leg edema  -continue palliative home care  -will order restaging CT AP w contrast to be done in 2 weeks    No problem-specific Assessment & Plan notes found for this encounter.   SUMMARY OF ONCOLOGIC HISTORY: Oncology History Overview Note  Cancer Staging Metastatic breast cancer [facility]) Staging form: Breast, AJCC 8th Edition - Clinical stage from 03/24/2018: Stage IV (cT2, cN1, pM1, G3, ER+, PR+, HER2+) - Signed by [name], MD on 03/30/2018     Metastatic breast cancer ([place])  01/30/2018 Procedure   Colonoscopy showed small polyp in the sigmoid colon, removed, the exam of colon including the terminal ileum was otherwise negative.   01/30/2018 Procedure   EGD by Dr. Collene Mares showed small hiatal hernia, a 8 mm polypoid lesion in the cardia, biopsied.   03/09/2018 Imaging   03/09/2018 US Abdomen IMPRESSION: 1. Mass lesions throughout the liver, consistent with metastatic disease. Liver as a somewhat nodular contour suggesting underlying hepatic cirrhosis. Inhomogeneous echotexture to the liver.   2. Cholelithiasis with mild gallbladder wall thickening. A degree of cholecystitis cannot be excluded by ultrasound.   3. Portions of pancreas obscured by gas. Visualized portions of pancreas appear normal.   4. Small right kidney. Etiology uncertain. This finding potentially may be indicative of renal artery stenosis. In this regard, question whether patient is hypertensive.   03/15/2018 Imaging   CT CAP with contrast   IMPRESSION: 1. Widespread hepatic metastasis. 2. 2.6 cm lateral right breast soft tissue nodule could represent a breast primary or an incidental benign lesion. Consider correlation with mammogram and ultrasound. 3. No definite source of primary malignancy identified within the abdomen or pelvis. There is possible rectosigmoid junction wall thickening. Consider colonoscopy with attention to this area. 4. Distal esophageal wall thickening, suggesting esophagitis.   03/24/2018 Cancer Staging   Staging form: Breast, AJCC 8th Edition - Clinical stage from 03/24/2018: Stage IV (cT2, cN1, pM1, G3, ER+, PR+, HER2+) - Signed by [name], MD on 03/30/2018    03/24/2018 Initial Biopsy   Diagnosis 1. 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 Breast, right, needle core biopsy, 9 o'clock, 7cm from nipple - INVASIVE DUCTAL CARCINOMA. -The carcinoma is somewhat morphologically dissimilar from that in part 1. It appears grade III 3. Lymph node, needle/core biopsy, right axillary - METASTATIC CARCINOMA IN 1 OF 1 LYMPH NODE (1/1).   03/24/2018 Receptors her2   Breast biopsy: 1. Estrogen Receptor: 40%, POSITIVE, STRONG-MODERATE STAINING INTENSITY Progesterone Receptor: 70%, POSITIVE, STRONG STAINING INTENSITY Proliferation Marker Ki67: 20% HER 2 equivocal by IHC 2+, POSITIVE by FISH, ratio 2.4 and copy #4.2  2. Estrogen Receptor: 60%, POSITIVE, MODERATE STAINING INTENSITY Progesterone Receptor: 40%, POSITIVE, MODERATE STAINING INTENSITY Proliferation Marker Ki67: 20% HER2 (+) by IHC 3+   03/24/2018 Initial Diagnosis  Metastatic breast cancer ([place])   03/27/2018 Pathology Results   Diagnosis Liver, needle/core biopsy, Right - METASTATIC CARCINOMA TO LIVER, CONSISTENT WITH PATIENTS CLINICAL HISTORY OF PRIMARY BREAST CARCINOMA.  ER 80%+ PR40%+ HER2- (by [facility], IHC 2+)  Ki67 50%    03/28/2018 Pathology Results    03/28/2018 Surgical Pathology Diagnosis 1. Breast, left, needle core biopsy, 9 o'clock - FIBROCYSTIC CHANGES. - THERE IS NO EVIDENCE OF MALIGNANCY. 2. Breast, left, needle core biopsy, 2 o'clock - FIBROADENOMA. - THERE IS NO EVIDENCE OF MALIGNANCY. - SEE COMMENT.   03/29/2018 Imaging   03/29/2018 Bone Scan IMPRESSION: No scintigraphic evidence of osseous metastatic disease.   03/30/2018 Imaging   Bone scan  IMPRESSION: No scintigraphic evidence of osseous metastatic disease.    04/07/2018 - 07/30/2020 Chemotherapy   First line chemo weekly Taxol and herceptin/Perjeta every 3 weeks starting 04/07/18. She developed infusion reaction to taxol and it was discontinued. Added Abraxane on C1D8, 2 weeks on/1 week off.  Abraxne stopped after 10/13/18 due to worsening Neuropathy. She has continued with maintenance Herceptin injection/Perjeta q3weeks. Herceptin changed to injection on 04/06/19. Both injections switched to combination [place] on 10/01/19. ----Stopped 07/30/20 due to disease progression in liver.    06/06/2018 Imaging   CT CAP IMPRESSION: 1. Generally improved appearance, with reduced axillary adenopathy and reduced enhancing component of the hepatic masses, with some of the hepatic mass is moderately smaller than on the prior exam. Reduced size of the right lateral breast mass compared to the prior 03/15/2018 exam. 2. New mild interstitial accentuation in the lungs, significance uncertain. Part of this appearance may be due to lower lung volumes on today's exam. 3. Mild wall thickening in the descending colon and upper rectum suggesting low-grade colitis/inflammation. Prominent stool throughout the colon favors constipation. 4. Other imaging findings of potential clinical significance: Aortic Atherosclerosis (ICD10-I70.0). Mild cardiomegaly. Mild nodularity in the right lower lobe appears stable. Contracted and thick-walled gallbladder.     09/18/2018 Imaging   CT CAP W Contrast 09/18/18   IMPRESSION: 1. Liver metastases have decreased in size. 2. Right breast mass, mild right axillary lymphadenopathy and scattered tiny right pulmonary nodules are all stable. 3. New mild left supraclavicular and left subpectoral lymphadenopathy, can not exclude progression of metastatic nodal disease. 4. Moderate colorectal stool volume, which may indicate constipation. 5.  Aortic Atherosclerosis (ICD10-I70.0).   10/2018 - 07/30/2020 Anti-estrogen oral therapy   Letrozole 2.5 mg daily starting 10/2018 D/c to proceed with Inhertu treatment after liver met progression.    01/10/2019 Imaging   CT CAP W Contrast 01/10/19  IMPRESSION: 1. Continued improvement in the hepatic metastatic lesions which have reduced in size. 2. Stable mild left supraclavicular and subpectoral adenopathy. 3. Essentially stable small right lower lobe pulmonary nodule and separate small subpleural nodule along the right hemidiaphragm. Surveillance suggested. 4. Other imaging findings of potential clinical significance: Mild cardiomegaly. Mild circumferential distal esophageal wall thickening, the most common cause would be esophagitis. Airway thickening is present, suggesting bronchitis or reactive airways disease. Airway plugging in the lower lobes and in the right middle lobe. Stable small right adrenal adenoma.   05/15/2019 Imaging   CT CAP W Contrast 05/15/19  IMPRESSION: CT CHEST IMPRESSION   1. Similar appearance of borderline supraclavicular, axillary, and subpectoral adenopathy. 2. Improved and resolved right lower lobe pulmonary nodularity. 3. Esophageal air fluid level suggests dysmotility or gastroesophageal reflux.   CT ABDOMEN AND PELVIS IMPRESSION   1. Improved hepatic metastasis. 2. Small bowel mesenteric lymph nodes which  are upper normal and mildly enlarged. Likely increased and similar as detailed above. Indeterminate. Recommend attention on follow-up. 3. Cholelithiasis. 4. Motion  degradation throughout the lower chest and abdomen.   09/19/2019 Imaging   CT CAP W contrast  IMPRESSION: 1. Interval decrease in size of right axillary and subpectoral lymph nodes. There are no pathologically enlarged lymph nodes remaining in the chest, abdomen, or pelvis. No new lymphadenopathy. 2. No significant change in post treatment appearance of multiple low-attenuation liver lesions, in keeping with treated metastases. 3. No evidence of new metastatic disease in the chest, abdomen, or pelvis. 4. Aortic Atherosclerosis (ICD10-I70.0).   11/16/2019 Imaging   IMRI Brain  MPRESSION: Minimal chronic microvascular ischemic changes. No acute intracranial process.   Active bilateral maxillary sinus disease with mild frontoethmoid mucosal thickening.   01/23/2020 Imaging   CT CAP W contrast  IMPRESSION: 1. Stable exam. No new or progressive interval findings. 2. Stable appearance of upper normal right axillary lymph nodes. Tiny subpectoral and left axillary nodes are unchanged. 3. Generally similar appearance of ill-defined hypoattenuating lesions in the liver compatible with treated metastases. 1 lesion in the central liver is minimally more conspicuous today, likely related to bolus timing and attention on follow-up recommended. No new suspicious liver lesion on today's study. 4. Stable 10 mm right adrenal nodule., indeterminate. Continued attention on follow-up imaging recommended. 5. Cholelithiasis. 6. Aortic Atherosclerosis (ICD10-I70.0).   02/11/2020 Breast MRI   IMPRESSION: 1. 7 millimeter focus of residual enhancement associated with the known malignancy in the 9 o'clock location of the RIGHT breast. 2. No significant enhancement in the known malignancy in the 11:30 o'clock location of the RIGHT breast. 3. Interval resolution of axillary adenopathy.   07/28/2020 Imaging   CT CAP  IMPRESSION: 1. Interval progression of hepatic metastases. 2. Stable indeterminate  right adrenal nodule. 3. No new sites of metastatic disease in the chest, abdomen or pelvis. 4. Chronic findings include: Small hiatal hernia. Cholelithiasis. Aortic Atherosclerosis (ICD10-I70.0).     08/08/2020 -  Chemotherapy   Second-line Inhertu q3weeks starting 08/08/20    01/06/2021 Imaging   MRI Brain  IMPRESSION: No evidence of acute intracranial abnormality.   No evidence of intracranial metastatic disease.   Stable noncontrast MRI appearance of the brain as compared to 11/16/2019.   Mild chronic small vessel ischemic changes within the cerebral white matter.   Redemonstrated tiny chronic lacunar infarct within the right cerebellar hemisphere.   Mild paranasal sinus disease, as described   03/24/2021 Imaging   CT CAP  IMPRESSION: 1. Increase in size and visible number of hepatic metastatic lesions compatible with progressive malignancy. 2. Other imaging findings of potential clinical significance: Aortic Atherosclerosis (ICD10-I70.0). Small type 1 hiatal hernia. Prominent stool throughout the colon favors constipation. Trace free pelvic fluid, etiology uncertain. Small right adrenal adenoma.   04/10/2021 - 05/01/2021 Chemotherapy   Patient is on Treatment Plan : BREAST Capecitabine + Trastuzumab + Tucatinib q21d     06/19/2021 -  Chemotherapy   Patient is on Treatment Plan : BREAST ADO-Trastuzumab Emtansine (Kadcyla) q21d        INTERVAL HISTORY:  Teresa Hays is here for a follow up of metastatic breast cancer. She was last seen by me on 05/29/21 while she was in the hospital. She presents to the clinic accompanied by her daughter. Her sister was present via phone. She reports her appetite is improving. Her sister notes she is taking at least 2 Ensure a day along with food. Her sister  reports concern of a boil on her rectal area. Teresa Hays reports it is mostly gone now.    All other systems were reviewed with the patient and are negative.  MEDICAL  HISTORY:  Past Medical History:  Diagnosis Date   GERD (gastroesophageal reflux disease)    Hypertension    Personal history of chemotherapy    rt breast ca with mets to liver dx'd 03/2018    SURGICAL HISTORY: Past Surgical History:  Procedure Laterality Date   BREAST BIOPSY Right 03/24/2018   CA x3   BREAST BIOPSY Left 03/28/2018   neg   BREAST BIOPSY Left 03/30/2018   neg   COLONOSCOPY     ESOPHAGOGASTRODUODENOSCOPY ENDOSCOPY     IR IMAGING GUIDED PORT INSERTION  04/04/2018    I have reviewed the social history and family history with the patient and they are unchanged from previous note.  ALLERGIES:  has No Known Allergies.  MEDICATIONS:  Current Outpatient Medications  Medication Sig Dispense Refill   furosemide (LASIX) 20 MG tablet Take 1 tablet (20 mg total) by mouth daily. 10 tablet 1   apixaban (ELIQUIS) 2.5 MG TABS tablet Take 1 tablet (2.5 mg total) by mouth 2 (two) times daily. 60 tablet 1   Budeson-Glycopyrrol-Formoterol 160-9-4.8 MCG/ACT AERO INHALE 2 PUFFS BY MOUTH INTO THE LUNGS IN THE MORNING AND AT BEDTIME. (Patient taking differently: Inhale 2 puffs into the lungs daily as needed (wheezing).) 10.7 g 6   calcium carbonate (OSCAL) 1500 (600 Ca) MG TABS tablet Take 600 mg of elemental calcium by mouth daily.     carvedilol (COREG) 3.125 MG tablet TAKE 1 TABLET BY MOUTH TWICE DAILY. REPLACES ATENOLOL (Patient taking differently: Take 3.125 mg by mouth 2 (two) times daily with a meal.) 180 tablet 3   DULoxetine (CYMBALTA) 20 MG capsule Take 1 capsule (20 mg total) by mouth at bedtime. 60 capsule 0   famotidine (PEPCID) 10 MG tablet Take 10 mg by mouth daily as needed for heartburn or indigestion.     feeding supplement (ENSURE ENLIVE / ENSURE PLUS) LIQD Take 237 mLs by mouth 3 (three) times daily between meals. 237 mL 12   gabapentin (NEURONTIN) 300 MG capsule Take 1 capsule (300 mg total) by mouth 3 (three) times daily. 90 capsule 0   lactulose (CHRONULAC) 10  GM/15ML solution Take 45 mLs (30 g total) by mouth daily. 236 mL 0   lidocaine (XYLOCAINE) 2 % jelly APPLY OVER PORT A CATH TOPICALLY 1 HOUR BEFORE PORT BEING ACCESSED AS NEEDED (Patient taking differently: Apply 1 application topically daily as needed (port access).) 30 mL 2   LORazepam (ATIVAN) 0.5 MG tablet Take 1 tablet (0.5 mg total) by mouth every 8 (eight) hours as needed for anxiety. 5 tablet 0   meclizine (ANTIVERT) 25 MG tablet TAKE 1/2 TABLET BY MOUTH 3 TIMES DAILY AS NEEDED FOR DIZZINESS (Patient taking differently: Take 12.5 mg by mouth 3 (three) times daily as needed for dizziness.) 15 tablet 2   megestrol (MEGACE) 400 MG/10ML suspension Take 15 mLs (600 mg total) by mouth daily. 480 mL 1   montelukast (SINGULAIR) 10 MG tablet TAKE 1 TABLET (10 MG TOTAL) BY MOUTH AT BEDTIME. (Patient taking differently: Take 10 mg by mouth at bedtime.) 30 tablet 5   Multiple Vitamin (MULTIVITAMIN WITH MINERALS) TABS tablet Take 1 tablet by mouth daily. 30 tablet 0   ondansetron (ZOFRAN) 8 MG tablet TAKE 1 TABLET BY MOUTH EVERY 8 HOURS AS NEEDED FOR NAUSEA OR VOMITING (Patient  taking differently: Take 8 mg by mouth every 8 (eight) hours as needed for nausea or vomiting.) 30 tablet 3   potassium chloride SA (KLOR-CON) 20 MEQ tablet TAKE 1 TABLET BY MOUTH ONCE A DAY (Patient taking differently: Take 20 mEq by mouth daily.) 30 tablet 3   prochlorperazine (COMPAZINE) 10 MG tablet TAKE 1 TABLET BY MOUTH EVERY 6 HOURS AS NEEDED FOR NAUSEA OR VOMITING (Patient taking differently: Take 10 mg by mouth every 6 (six) hours as needed for nausea.) 30 tablet 3   traMADol (ULTRAM) 50 MG tablet Take 1 tablet (50 mg total) by mouth every 6 (six) hours as needed for moderate pain or severe pain. 30 tablet 0   No current facility-administered medications for this visit.   Facility-Administered Medications Ordered in Other Visits  Medication Dose Route Frequency Provider Last Rate Last Admin   sodium chloride flush (NS) 0.9  % injection 10 mL  10 mL Intracatheter PRN [name], MD   10 mL at 05/28/20 0935    PHYSICAL EXAMINATION: ECOG PERFORMANCE STATUS: 3 - Symptomatic, >50% confined to bed  Vitals:   06/05/21 0844  BP: (!) 135/98  Pulse: 94  Resp: 18  Temp: 98.5 F (36.9 C)  SpO2: 100%   Wt Readings from Last 3 Encounters:  06/05/21 155 lb (70.3 kg)  05/28/21 144 lb 13.5 oz (65.7 kg)  05/22/21 140 lb (63.5 kg)     GENERAL:alert, no distress and comfortable SKIN: skin color, texture, turgor are normal, no rashes or significant lesions EYES: normal, Conjunctiva are pink and non-injected, sclera clear  NECK: supple, thyroid normal size, non-tender, without nodularity LYMPH:  no palpable lymphadenopathy in the cervical, axillary  LUNGS: clear to auscultation and percussion with normal breathing effort HEART: regular rate & rhythm and no murmurs and no lower extremity edema ABDOMEN:abdomen soft, non-tender and normal bowel sounds Musculoskeletal:no cyanosis of digits and no clubbing  NEURO: alert & oriented x 3 with fluent speech, no focal motor/sensory deficits RECTAL, external only: skin tag present, mild tenderness, no boil or ulceration present  LABORATORY DATA:  I have reviewed the data as listed CBC Latest Ref Rng & Units 06/05/2021 05/31/2021 05/30/2021  WBC 4.0 - 10.5 K/uL 7.1 7.5 6.4  Hemoglobin 12.0 - 15.0 g/dL 10.3(L) 10.2(L) 10.7(L)  Hematocrit 36.0 - 46.0 % 29.5(L) 29.5(L) 31.5(L)  Platelets 150 - 400 K/uL 245 187 208     CMP Latest Ref Rng & Units 06/05/2021 05/31/2021 05/30/2021  Glucose 70 - 99 mg/dL 86 88 108(H)  BUN 8 - 23 mg/dL _0 Creatinine 0.44 - 1.00 mg/dL 1.00 0.74 0.84  Sodium 135 - 145 mmol/L 135 134(L) 130(L)  Potassium 3.5 - 5.1 mmol/L 4.3 3.3(L) 2.9(L)  Chloride 98 - 111 mmol/L 104 107 104  CO2 22 - 32 mmol/L 22 21(L) 18(L)  Calcium 8.9 - 10.3 mg/dL 9.5 8.4(L) 8.6(L)  Total Protein 6.5 - 8.1 g/dL 6.6 6.1(L) 6.6  Total Bilirubin 0.3 - 1.2 mg/dL 2.8(H)  2.5(H) 2.6(H)  Alkaline Phos 38 - 126 U/L 416(H) 283(H) 301(H)  AST 15 - 41 U/L 140(H) 138(H) 188(H)  ALT 0 - 44 U/L 70(H) 77(H) 101(H)      RADIOGRAPHIC STUDIES: I have personally reviewed the radiological images as listed and agreed with the findings in the report. No results found.    No orders of the defined types were placed in this encounter.  All questions were answered. The patient knows to call the clinic with any problems,  questions or concerns. No barriers to learning was detected. The total time spent in the appointment was 40 minutes.     [name], MD 06/05/2021   I, Wilburn Mylar, am acting as scribe for [name], MD.   I have reviewed the above documentation for accuracy and completeness, and I agree with the above.

## 2021-06-08 ENCOUNTER — Other Ambulatory Visit (HOSPITAL_COMMUNITY): Payer: Self-pay

## 2021-06-08 ENCOUNTER — Ambulatory Visit (INDEPENDENT_AMBULATORY_CARE_PROVIDER_SITE_OTHER): Payer: Medicare Other | Admitting: Family Medicine

## 2021-06-08 ENCOUNTER — Other Ambulatory Visit: Payer: Self-pay

## 2021-06-08 VITALS — BP 124/94 | HR 92 | Ht 64.0 in | Wt 154.6 lb

## 2021-06-08 DIAGNOSIS — Z09 Encounter for follow-up examination after completed treatment for conditions other than malignant neoplasm: Secondary | ICD-10-CM | POA: Diagnosis present

## 2021-06-08 NOTE — Assessment & Plan Note (Addendum)
Improving. No nausea or vomiting.  Able to hydrate now.  Followed closely by Oncology and Cardiology.  Has appointments scheduled in Dec.  Hiccups at night without pain. Has not had long course of treatment -Trial Pepcid BID x 2 weeks, if no improvement can consider PPI and if lasting>48 hr consistent -Follow up with PCP in 3 months or sooner if needed

## 2021-06-08 NOTE — Progress Notes (Signed)
    SUBJECTIVE:   CHIEF COMPLAINT / HPI:   Patient admitted to hospital between 11/0/ and 11/13 and treated for metabolic encephalopathy, elevated liver enzymes, hyperbilirubinemia and dehydration.  MRI brain 11/8 negative. Discharged on 11/13. Presents today for follow up. Reports improvement in symptoms. Denies any nausea, vomiting, abdominal pain, chest pain, shortness of breath.  Mentation at baseline.  Reports having hiccups at night.  Was previously advised to start Pepcid but had not taken medication.    Followed closely by on Dr Burr Medico, Oncology.  Last visit 11/18. Restarted her Lasix at that time.  Plan to obtain scan and start another treatment after holidays to give her some time to recover from recent illness.  PERTINENT  PMH / PSH:  Metastatic Right Breast Ca, Stage IV Liver Mets  OBJECTIVE:   BP (!) 124/94   Pulse 92   Ht 5\' 4"  (1.626 m)   Wt 154 lb 9.6 oz (70.1 kg)   SpO2 99%   BMI 26.54 kg/m    General: Alert, no acute distress Cardio: Normal S1 and S2, RRR, no r/m/g Pulm: CTAB, normal work of breathing Abdomen: Bowel sounds normal. Abdomen soft and non-tender.  Extremities: No peripheral edema.   ASSESSMENT/PLAN:   Hospital discharge follow-up Improving. No nausea or vomiting.  Able to hydrate now.  Followed closely by Oncology and Cardiology.  Has appointments scheduled in Dec.  Hiccups at night without pain. Has not had long course of treatment -Trial Pepcid BID x 2 weeks, if no improvement can consider PPI and if lasting>48 hr consistent -Follow up with PCP in 3 months or sooner if needed     Carollee Leitz, MD [place]

## 2021-06-08 NOTE — Patient Instructions (Signed)
Thank you for coming to see me today. It was a pleasure.   For your hiccups start Pepcid 10 mg twice a day for 2 weeks If no improvement follow up with PCP  Follow up with Oncology and Cardiology as schedule in Dec.   Continue to hydrate with fluids If you have any fevers or chills nausea, vomiting or diarrhea please go to Urgent Care for evaluation  Please follow-up with PCP as in 3 months  If you have any questions or concerns, please do not hesitate to call the office at [phone].  Best,   Carollee Leitz, MD

## 2021-06-09 ENCOUNTER — Telehealth: Payer: Self-pay | Admitting: Hematology

## 2021-06-09 ENCOUNTER — Encounter: Payer: Self-pay | Admitting: Family Medicine

## 2021-06-09 ENCOUNTER — Telehealth: Payer: Self-pay | Admitting: *Deleted

## 2021-06-09 NOTE — Telephone Encounter (Signed)
Left message with follow-up appointments per 11/18 los. 

## 2021-06-09 NOTE — Telephone Encounter (Signed)
Connected with [name] daughter Shellee Milo regarding Continuous FMLA leave form. Form is ready for pick-up.  Asked if 06/05/2021 is accurate start date of leave. Asked if she would like office to return form to [employer] (fax [phone]).  "Start date of leave is 05/25/2021 when she was admitted.  I am working.  Need continuous leave coverage when out of work for her appointments or assisting in her care.  Requesting a continuous, intermittent leave.  Fax form to [place] and we will pick up original at next appointment on 06/16/2021."    Form corrected and successfully faxed.  Envelope with original at appointment registration area number one ready for pick up.

## 2021-06-10 ENCOUNTER — Telehealth: Payer: Self-pay

## 2021-06-10 NOTE — Telephone Encounter (Signed)
This nurse received a message from this patients, niece stating that patient was discharged from hospital on 06/08/21.  She wanted to know what medications was the patient supposed to resume taking or should they follow the instructions from discharge summary.  This nurse returned call and informed the nurse that MD was not in the office at this time and it would be best to follow the instructions on discharge summary.  Also informed that this request would be forwarded to the MD and she will review and give recommendations at that time.  No further questions or concerns at this time.

## 2021-06-12 ENCOUNTER — Other Ambulatory Visit: Payer: Self-pay

## 2021-06-12 ENCOUNTER — Encounter (HOSPITAL_COMMUNITY): Payer: Self-pay

## 2021-06-12 ENCOUNTER — Other Ambulatory Visit (HOSPITAL_COMMUNITY): Payer: Self-pay

## 2021-06-12 ENCOUNTER — Inpatient Hospital Stay (HOSPITAL_COMMUNITY)
Admission: EM | Admit: 2021-06-12 | Discharge: 2021-06-19 | DRG: 435 | Disposition: A | Discharge status: Hospice | Payer: Medicare Other | Attending: Family Medicine | Admitting: Family Medicine

## 2021-06-12 DIAGNOSIS — Z515 Encounter for palliative care: Secondary | ICD-10-CM

## 2021-06-12 DIAGNOSIS — K7682 Hepatic encephalopathy: Secondary | ICD-10-CM | POA: Diagnosis present

## 2021-06-12 DIAGNOSIS — Z8261 Family history of arthritis: Secondary | ICD-10-CM

## 2021-06-12 DIAGNOSIS — T451X5A Adverse effect of antineoplastic and immunosuppressive drugs, initial encounter: Secondary | ICD-10-CM | POA: Diagnosis present

## 2021-06-12 DIAGNOSIS — K219 Gastro-esophageal reflux disease without esophagitis: Secondary | ICD-10-CM | POA: Diagnosis present

## 2021-06-12 DIAGNOSIS — C787 Secondary malignant neoplasm of liver and intrahepatic bile duct: Principal | ICD-10-CM | POA: Diagnosis present

## 2021-06-12 DIAGNOSIS — N390 Urinary tract infection, site not specified: Secondary | ICD-10-CM | POA: Diagnosis present

## 2021-06-12 DIAGNOSIS — Z20822 Contact with and (suspected) exposure to covid-19: Secondary | ICD-10-CM | POA: Diagnosis present

## 2021-06-12 DIAGNOSIS — Z79899 Other long term (current) drug therapy: Secondary | ICD-10-CM

## 2021-06-12 DIAGNOSIS — Z7951 Long term (current) use of inhaled steroids: Secondary | ICD-10-CM

## 2021-06-12 DIAGNOSIS — Y92009 Unspecified place in unspecified non-institutional (private) residence as the place of occurrence of the external cause: Secondary | ICD-10-CM

## 2021-06-12 DIAGNOSIS — D6481 Anemia due to antineoplastic chemotherapy: Secondary | ICD-10-CM | POA: Diagnosis present

## 2021-06-12 DIAGNOSIS — R4182 Altered mental status, unspecified: Secondary | ICD-10-CM

## 2021-06-12 DIAGNOSIS — R600 Localized edema: Secondary | ICD-10-CM | POA: Diagnosis present

## 2021-06-12 DIAGNOSIS — J45909 Unspecified asthma, uncomplicated: Secondary | ICD-10-CM | POA: Diagnosis present

## 2021-06-12 DIAGNOSIS — Z803 Family history of malignant neoplasm of breast: Secondary | ICD-10-CM

## 2021-06-12 DIAGNOSIS — I5189 Other ill-defined heart diseases: Secondary | ICD-10-CM

## 2021-06-12 DIAGNOSIS — C50919 Malignant neoplasm of unspecified site of unspecified female breast: Secondary | ICD-10-CM | POA: Diagnosis present

## 2021-06-12 DIAGNOSIS — Z853 Personal history of malignant neoplasm of breast: Secondary | ICD-10-CM

## 2021-06-12 DIAGNOSIS — L539 Erythematous condition, unspecified: Secondary | ICD-10-CM

## 2021-06-12 DIAGNOSIS — I82622 Acute embolism and thrombosis of deep veins of left upper extremity: Secondary | ICD-10-CM | POA: Diagnosis present

## 2021-06-12 DIAGNOSIS — Z833 Family history of diabetes mellitus: Secondary | ICD-10-CM

## 2021-06-12 DIAGNOSIS — Z7189 Other specified counseling: Secondary | ICD-10-CM

## 2021-06-12 DIAGNOSIS — I429 Cardiomyopathy, unspecified: Secondary | ICD-10-CM | POA: Diagnosis present

## 2021-06-12 DIAGNOSIS — R7989 Other specified abnormal findings of blood chemistry: Secondary | ICD-10-CM | POA: Diagnosis present

## 2021-06-12 DIAGNOSIS — K7290 Hepatic failure, unspecified without coma: Secondary | ICD-10-CM | POA: Diagnosis present

## 2021-06-12 DIAGNOSIS — D649 Anemia, unspecified: Secondary | ICD-10-CM

## 2021-06-12 DIAGNOSIS — Z7901 Long term (current) use of anticoagulants: Secondary | ICD-10-CM

## 2021-06-12 DIAGNOSIS — E871 Hypo-osmolality and hyponatremia: Secondary | ICD-10-CM | POA: Diagnosis not present

## 2021-06-12 DIAGNOSIS — T50915A Adverse effect of multiple unspecified drugs, medicaments and biological substances, initial encounter: Secondary | ICD-10-CM | POA: Diagnosis present

## 2021-06-12 DIAGNOSIS — E878 Other disorders of electrolyte and fluid balance, not elsewhere classified: Secondary | ICD-10-CM

## 2021-06-12 DIAGNOSIS — I10 Essential (primary) hypertension: Secondary | ICD-10-CM | POA: Diagnosis present

## 2021-06-12 DIAGNOSIS — Z86718 Personal history of other venous thrombosis and embolism: Secondary | ICD-10-CM

## 2021-06-12 DIAGNOSIS — L03319 Cellulitis of trunk, unspecified: Secondary | ICD-10-CM | POA: Diagnosis not present

## 2021-06-12 DIAGNOSIS — G9340 Encephalopathy, unspecified: Secondary | ICD-10-CM | POA: Diagnosis present

## 2021-06-12 DIAGNOSIS — E876 Hypokalemia: Secondary | ICD-10-CM | POA: Diagnosis not present

## 2021-06-12 DIAGNOSIS — G9341 Metabolic encephalopathy: Secondary | ICD-10-CM | POA: Diagnosis present

## 2021-06-12 DIAGNOSIS — Z66 Do not resuscitate: Secondary | ICD-10-CM | POA: Diagnosis present

## 2021-06-12 MED ORDER — MORPHINE SULFATE (PF) 4 MG/ML IV SOLN
4.0000 mg | Freq: Once | INTRAVENOUS | Status: AC
  Administered 2021-06-13: 4 mg via INTRAVENOUS
  Filled 2021-06-12: qty 1

## 2021-06-12 NOTE — ED Triage Notes (Signed)
Patient discharged 3 days ago, family states that patient has had increasing weakness and body pain. Stage 4 breast cancer that mets to liver. Most of the pain is in her abdomen. Not currently getting chemo because she is in liver failure. Currently put on hospice. EMS was waiting for the on call RN to call back. Family wanted patient to come in.

## 2021-06-12 NOTE — ED Provider Notes (Signed)
[facility] DEPT Provider Note   CSN: [id number] Arrival date & time: 06/12/21  2240     History Chief Complaint  Patient presents with   Weakness   Abdominal Pain    Teresa Hays is a 65 y.o. female.  The history is provided by the EMS personnel and the patient. The history is limited by the condition of the patient (Altered mental status).  Weakness Associated symptoms: abdominal pain   Abdominal Pain She has history of hypertension, breast cancer metastatic to liver with recent hospitalization for dehydration with acute kidney injury, left arm DVT anticoagulated on apixaban.  She is currently having her chemotherapy held so that she could not improve her overall nutritional status.  Apparently, she is complaining of increased weakness and body pain, according to what family told EMS.  She is under hospice care.  She denies nausea or vomiting.   Past Medical History:  Diagnosis Date   GERD (gastroesophageal reflux disease)    Hypertension    Personal history of chemotherapy    rt breast ca with mets to liver dx'd 03/2018    Patient Active Problem List   Diagnosis Date Noted   Hospital discharge follow-up 06/08/2021   Dehydration    Encephalopathy    AKI (acute kidney injury) ([place]) 05/25/2021   Mild intermittent asthma without complication 82/42/3536   Asthma 04/02/2020   Cough, persistent 11/24/2019   Shortness of breath 11/23/2019   Gastroesophageal reflux disease 10/16/2019   Health maintenance examination 10/16/2019   Dysphagia 10/12/2019   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     Family History  Problem Relation Age of Onset   Diabetes Mother    Cancer Sister 2       breast cancer   Breast cancer Sister    Rheum arthritis Sister     Social History   Tobacco Use   Smoking status: Never   Smokeless tobacco: Never  Vaping Use   Vaping Use: Never used  Substance Use Topics   Alcohol use: No   Drug use: Never    Home Medications Prior to Admission medications   Medication Sig Start Date End Date Taking? Authorizing Provider  apixaban (ELIQUIS) 2.5 MG TABS tablet Take 1 tablet (2.5 mg total) by mouth 2 (two) times daily. 06/05/21   [name], MD  Budeson-Glycopyrrol-Formoterol 160-9-4.8 MCG/ACT AERO INHALE 2 PUFFS BY MOUTH INTO THE LUNGS IN THE MORNING AND AT BEDTIME. Patient taking differently: Inhale 2 puffs into the lungs daily as needed (wheezing). 09/11/20 09/11/21  Garner Nash, DO  calcium carbonate (OSCAL) 1500 (600 Ca) MG TABS tablet Take 600 mg of elemental calcium by mouth daily.    [provider]  carvedilol (COREG) 3.125 MG tablet TAKE 1 TABLET BY MOUTH TWICE DAILY. REPLACES ATENOLOL Patient taking differently: Take 3.125 mg by mouth 2 (two) times daily with a meal.  02/04/21 02/04/22  Bensimhon, Shaune Pascal, MD  DULoxetine (CYMBALTA) 20 MG capsule Take 1 capsule (20 mg total) by mouth at bedtime. 05/31/21 05/31/22  Florencia Reasons, MD  famotidine (PEPCID) 10 MG tablet Take 10 mg by mouth daily as needed for heartburn or indigestion.    [provider]  feeding supplement (ENSURE ENLIVE / ENSURE PLUS) LIQD Take 237 mLs by mouth 3 (three) times daily between meals. 05/31/21   Florencia Reasons, MD  furosemide (LASIX) 20 MG tablet Take 1 tablet (20 mg total) by mouth daily. 06/05/21   [name], MD  gabapentin (NEURONTIN) 300 MG capsule Take 1 capsule  (300 mg total) by mouth 3 (three) times daily. 05/31/21 05/31/22  Florencia Reasons, MD  lactulose (CHRONULAC) 10 GM/15ML solution Take 45 mLs (30 g total) by mouth daily. 05/31/21   Florencia Reasons, MD  lidocaine (XYLOCAINE) 2 % jelly APPLY OVER PORT A CATH TOPICALLY 1 HOUR BEFORE PORT BEING ACCESSED AS NEEDED Patient taking differently: Apply 1 application topically daily as needed (port access). 07/04/20 07/04/21  [name], MD  LORazepam (ATIVAN) 0.5 MG tablet Take 1 tablet (0.5 mg total) by mouth every 8 (eight) hours as needed for anxiety. 05/31/21   Florencia Reasons, MD  meclizine (ANTIVERT) 25 MG tablet TAKE 1/2 TABLET BY MOUTH 3 TIMES DAILY AS NEEDED FOR DIZZINESS Patient taking differently: Take 12.5 mg by mouth 3 (three) times daily as needed for dizziness. 02/20/21 02/20/22  [name], MD  megestrol (MEGACE) 40 MG/ML suspension Take 15 mLs (600 mg total) by mouth daily. 06/05/21   [name], MD  montelukast (SINGULAIR) 10 MG tablet TAKE 1 TABLET (10 MG TOTAL) BY MOUTH AT BEDTIME. Patient taking differently: Take 10 mg by mouth at bedtime. 11/24/20 11/24/21  Martyn Ehrich, NP  Multiple Vitamin (MULTIVITAMIN WITH MINERALS) TABS tablet Take 1 tablet by mouth daily. 05/31/21   Florencia Reasons, MD  ondansetron (ZOFRAN) 8 MG tablet TAKE 1 TABLET BY MOUTH EVERY 8 HOURS AS NEEDED FOR NAUSEA OR VOMITING Patient taking differently: Take 8 mg by mouth every 8 (eight) hours as needed for nausea or vomiting. 08/08/20 08/08/21  [name], MD  potassium chloride SA (KLOR-CON) 20 MEQ tablet TAKE 1 TABLET BY MOUTH ONCE A DAY 06/05/21 06/05/22  [name], MD  prochlorperazine (COMPAZINE) 10 MG tablet TAKE 1 TABLET BY MOUTH EVERY 6 HOURS AS NEEDED FOR NAUSEA OR VOMITING Patient taking differently: Take 10 mg by mouth every 6 (six) hours as needed for nausea. 08/08/20 08/08/21  [name], MD  traMADol (ULTRAM) 50 MG tablet Take 1 tablet (50 mg total) by mouth every 6 (six) hours as needed for moderate pain or severe pain. 12/29/20   [name], MD   albuterol (VENTOLIN HFA) 108 (90 Base) MCG/ACT inhaler Inhale 1-2 puffs into the lungs every 6 (six) hours as needed for wheezing or shortness of breath. 08/21/20 09/19/20  Garner Nash, DO  budesonide-formoterol (SYMBICORT) 160-4.5 MCG/ACT inhaler Inhale 2 puffs into the lungs 2 (two) times daily. Rinse mouth after each use. 08/21/20 08/29/20  Garner Nash, DO    Allergies    Patient has no known allergies.  Review of Systems   Review of Systems  Unable to perform ROS: Mental status change  Gastrointestinal:  Positive for abdominal pain.  Neurological:  Positive for weakness.   Physical Exam Updated Vital Signs BP 124/88   Pulse (!) 105   Temp 99.1 F (37.3 C) (Oral)   Resp 20   SpO2  100%   Physical Exam Vitals and nursing note reviewed.  65 year old female, resting comfortably and in no acute distress. Vital signs are significant for slightly elevated heart rate. Oxygen saturation is 100%, which is normal. Head is normocephalic and atraumatic. PERRLA, EOMI. Oropharynx is clear. Neck is nontender and supple without adenopathy or JVD. Back is nontender and there is no CVA tenderness. Lungs are clear without rales, wheezes, or rhonchi. Chest is mildly tender in the left anterior chest wall. Heart has regular rate and rhythm without murmur. Abdomen is soft, flat, nontender without masses or hepatosplenomegaly and peristalsis is hypoactive. Extremities have 3+ edema, full range of motion is present. Skin is warm and dry without rash. Neurologic: Awake but slow to respond, oriented to person and place but not time, cranial nerves are intact, moves all extremities equally.  No asterixis noted.  ED Results / Procedures / Treatments   Labs (all labs ordered are listed, but only abnormal results are displayed) Labs Reviewed  COMPREHENSIVE METABOLIC PANEL - Abnormal; Notable for the following components:      Result Value   Sodium 128 (*)    Chloride 96 (*)    Creatinine, Ser  1.18 (*)    Albumin 3.1 (*)    AST 299 (*)    ALT 113 (*)    Alkaline Phosphatase 357 (*)    Total Bilirubin 6.0 (*)    GFR, Estimated 52 (*)    All other components within normal limits  CBC WITH DIFFERENTIAL/PLATELET - Abnormal; Notable for the following components:   Hemoglobin 11.6 (*)    HCT 33.9 (*)    RDW 18.9 (*)    Monocytes Absolute 1.1 (*)    All other components within normal limits  AMMONIA - Abnormal; Notable for the following components:   Ammonia 49 (*)    All other components within normal limits  RESP PANEL BY RT-PCR (FLU A&B, COVID) [id number]  URINALYSIS, ROUTINE W REFLEX MICROSCOPIC  TROPONIN I (HIGH SENSITIVITY)  TROPONIN I (HIGH SENSITIVITY)   Radiology DG Chest Port 1 View  Result Date: 06/13/2021 CLINICAL DATA:  Increased weakness and body pain. EXAM: PORTABLE CHEST 1 VIEW COMPARISON:  May 25, 2021 FINDINGS: There is stable left-sided venous Port-A-Cath positioning. The heart size and mediastinal contours are within normal limits. Low lung volumes are seen. Both lungs are clear. The visualized skeletal structures are unremarkable. IMPRESSION: No active disease. Electronically Signed   By: Virgina Norfolk M.D.   On: 06/13/2021 00:43    Procedures Procedures   Medications Ordered in ED Medications - No data to display  ED Course  I have reviewed the triage vital signs and the nursing notes.  Pertinent labs & imaging results that were available during my care of the patient were reviewed by me and considered in my medical decision making (see chart for details).   MDM Rules/Calculators/A&P                         Patient with known liver metastases and generally slow to respond concerning for hepatic encephalopathy.  Complaints of pain likely related to metastatic lung cancer.  Old records are reviewed confirming recent hospitalization for acute kidney injury.  We will recheck labs including ammonia level.  Given pain in the chest, will check ECG and  troponin level.  Patient currently anticoagulated, no need to check for pulmonary embolism.  Family member has arrived, patient is mental status is changed rather abruptly  today, had been alert and oriented.  Family also reaffirms DNR status, but states that patient is actually not on hospice as she was told she did not qualify for hospice yet.  Labs are unremarkable.  Ammonia level is 49 which is not significantly changed from prior.  Mild hyponatremia is present and not felt to be clinically significant.  Transaminases are slightly higher compared with recent, but not at a level that should account for mental status change.  Bilirubin has also increased, but should not be causing mental status change.  Alkaline phosphatase has actually decreased slightly.  Urinalysis is pending.  Chest x-ray showed no acute process.  CT of head has been ordered.  She will be admitted for further evaluation of mental status change.  Case is discussed with Dr. Hal Hope of [facility], who agrees to admit the patient.  Final Clinical Impression(s) / ED Diagnoses Final diagnoses:  Altered mental status, unspecified altered mental status type  Carcinoma of breast metastatic to liver, unspecified laterality ([place])  Elevated liver function tests  Hyponatremia  Increased ammonia level  Chronic anticoagulation    Rx / DC Orders ED Discharge Orders     None        Delora Fuel, MD 95/32/02 [phone]

## 2021-06-13 ENCOUNTER — Observation Stay (HOSPITAL_COMMUNITY): Payer: Medicare Other

## 2021-06-13 ENCOUNTER — Other Ambulatory Visit: Payer: Self-pay

## 2021-06-13 ENCOUNTER — Emergency Department (HOSPITAL_COMMUNITY): Payer: Medicare Other

## 2021-06-13 ENCOUNTER — Encounter (HOSPITAL_COMMUNITY): Payer: Self-pay | Admitting: Internal Medicine

## 2021-06-13 DIAGNOSIS — E871 Hypo-osmolality and hyponatremia: Secondary | ICD-10-CM | POA: Diagnosis present

## 2021-06-13 DIAGNOSIS — R4182 Altered mental status, unspecified: Secondary | ICD-10-CM | POA: Diagnosis not present

## 2021-06-13 DIAGNOSIS — I82622 Acute embolism and thrombosis of deep veins of left upper extremity: Secondary | ICD-10-CM | POA: Diagnosis present

## 2021-06-13 DIAGNOSIS — G9341 Metabolic encephalopathy: Secondary | ICD-10-CM | POA: Diagnosis present

## 2021-06-13 DIAGNOSIS — Z79899 Other long term (current) drug therapy: Secondary | ICD-10-CM | POA: Diagnosis not present

## 2021-06-13 DIAGNOSIS — Z7951 Long term (current) use of inhaled steroids: Secondary | ICD-10-CM | POA: Diagnosis not present

## 2021-06-13 DIAGNOSIS — Z20822 Contact with and (suspected) exposure to covid-19: Secondary | ICD-10-CM | POA: Diagnosis present

## 2021-06-13 DIAGNOSIS — I429 Cardiomyopathy, unspecified: Secondary | ICD-10-CM | POA: Diagnosis present

## 2021-06-13 DIAGNOSIS — Z7901 Long term (current) use of anticoagulants: Secondary | ICD-10-CM | POA: Diagnosis not present

## 2021-06-13 DIAGNOSIS — K7682 Hepatic encephalopathy: Secondary | ICD-10-CM | POA: Diagnosis present

## 2021-06-13 DIAGNOSIS — Z833 Family history of diabetes mellitus: Secondary | ICD-10-CM | POA: Diagnosis not present

## 2021-06-13 DIAGNOSIS — D6481 Anemia due to antineoplastic chemotherapy: Secondary | ICD-10-CM | POA: Diagnosis present

## 2021-06-13 DIAGNOSIS — R600 Localized edema: Secondary | ICD-10-CM | POA: Diagnosis present

## 2021-06-13 DIAGNOSIS — Y92009 Unspecified place in unspecified non-institutional (private) residence as the place of occurrence of the external cause: Secondary | ICD-10-CM | POA: Diagnosis not present

## 2021-06-13 DIAGNOSIS — C50919 Malignant neoplasm of unspecified site of unspecified female breast: Secondary | ICD-10-CM | POA: Diagnosis not present

## 2021-06-13 DIAGNOSIS — K7290 Hepatic failure, unspecified without coma: Secondary | ICD-10-CM | POA: Diagnosis present

## 2021-06-13 DIAGNOSIS — K219 Gastro-esophageal reflux disease without esophagitis: Secondary | ICD-10-CM | POA: Diagnosis present

## 2021-06-13 DIAGNOSIS — R531 Weakness: Secondary | ICD-10-CM | POA: Diagnosis not present

## 2021-06-13 DIAGNOSIS — Z853 Personal history of malignant neoplasm of breast: Secondary | ICD-10-CM | POA: Diagnosis not present

## 2021-06-13 DIAGNOSIS — N390 Urinary tract infection, site not specified: Secondary | ICD-10-CM | POA: Diagnosis present

## 2021-06-13 DIAGNOSIS — Z66 Do not resuscitate: Secondary | ICD-10-CM | POA: Diagnosis present

## 2021-06-13 DIAGNOSIS — G9340 Encephalopathy, unspecified: Secondary | ICD-10-CM | POA: Diagnosis present

## 2021-06-13 DIAGNOSIS — Z8261 Family history of arthritis: Secondary | ICD-10-CM | POA: Diagnosis not present

## 2021-06-13 DIAGNOSIS — J45909 Unspecified asthma, uncomplicated: Secondary | ICD-10-CM | POA: Diagnosis present

## 2021-06-13 DIAGNOSIS — Z515 Encounter for palliative care: Secondary | ICD-10-CM | POA: Diagnosis not present

## 2021-06-13 DIAGNOSIS — Z7189 Other specified counseling: Secondary | ICD-10-CM | POA: Diagnosis not present

## 2021-06-13 DIAGNOSIS — L03319 Cellulitis of trunk, unspecified: Secondary | ICD-10-CM | POA: Diagnosis not present

## 2021-06-13 DIAGNOSIS — C787 Secondary malignant neoplasm of liver and intrahepatic bile duct: Secondary | ICD-10-CM | POA: Diagnosis present

## 2021-06-13 DIAGNOSIS — Z803 Family history of malignant neoplasm of breast: Secondary | ICD-10-CM | POA: Diagnosis not present

## 2021-06-13 DIAGNOSIS — I10 Essential (primary) hypertension: Secondary | ICD-10-CM | POA: Diagnosis present

## 2021-06-13 LAB — CBC WITH DIFFERENTIAL/PLATELET
Abs Immature Granulocytes: 0.05 10*3/uL (ref 0.00–0.07)
Basophils Absolute: 0 10*3/uL (ref 0.0–0.1)
Basophils Relative: 0 %
Eosinophils Absolute: 0 10*3/uL (ref 0.0–0.5)
Eosinophils Relative: 0 %
HCT: 33.9 % — ABNORMAL LOW (ref 36.0–46.0)
Hemoglobin: 11.6 g/dL — ABNORMAL LOW (ref 12.0–15.0)
Immature Granulocytes: 1 %
Lymphocytes Relative: 18 %
Lymphs Abs: 1.8 10*3/uL (ref 0.7–4.0)
MCH: 27.8 pg (ref 26.0–34.0)
MCHC: 34.2 g/dL (ref 30.0–36.0)
MCV: 81.3 fL (ref 80.0–100.0)
Monocytes Absolute: 1.1 10*3/uL — ABNORMAL HIGH (ref 0.1–1.0)
Monocytes Relative: 11 %
Neutro Abs: 6.9 10*3/uL (ref 1.7–7.7)
Neutrophils Relative %: 70 %
Platelets: 251 10*3/uL (ref 150–400)
RBC: 4.17 MIL/uL (ref 3.87–5.11)
RDW: 18.9 % — ABNORMAL HIGH (ref 11.5–15.5)
WBC: 10 10*3/uL (ref 4.0–10.5)
nRBC: 0 % (ref 0.0–0.2)

## 2021-06-13 LAB — URINALYSIS, ROUTINE W REFLEX MICROSCOPIC
Bilirubin Urine: NEGATIVE
Glucose, UA: NEGATIVE mg/dL
Hgb urine dipstick: NEGATIVE
Ketones, ur: NEGATIVE mg/dL
Nitrite: NEGATIVE
Protein, ur: NEGATIVE mg/dL
Specific Gravity, Urine: 1.012 (ref 1.005–1.030)
pH: 5 (ref 5.0–8.0)

## 2021-06-13 LAB — RESP PANEL BY RT-PCR (FLU A&B, COVID) ARPGX2
Influenza A by PCR: NEGATIVE
Influenza B by PCR: NEGATIVE
SARS Coronavirus 2 by RT PCR: NEGATIVE

## 2021-06-13 LAB — COMPREHENSIVE METABOLIC PANEL
ALT: 113 U/L — ABNORMAL HIGH (ref 0–44)
AST: 299 U/L — ABNORMAL HIGH (ref 15–41)
Albumin: 3.1 g/dL — ABNORMAL LOW (ref 3.5–5.0)
Alkaline Phosphatase: 357 U/L — ABNORMAL HIGH (ref 38–126)
Anion gap: 10 (ref 5–15)
BUN: 14 mg/dL (ref 8–23)
CO2: 22 mmol/L (ref 22–32)
Calcium: 9.4 mg/dL (ref 8.9–10.3)
Chloride: 96 mmol/L — ABNORMAL LOW (ref 98–111)
Creatinine, Ser: 1.18 mg/dL — ABNORMAL HIGH (ref 0.44–1.00)
GFR, Estimated: 52 mL/min — ABNORMAL LOW (ref >60)
Glucose, Bld: 91 mg/dL (ref 70–99)
Potassium: 4.7 mmol/L (ref 3.5–5.1)
Sodium: 128 mmol/L — ABNORMAL LOW (ref 135–145)
Total Bilirubin: 6 mg/dL — ABNORMAL HIGH (ref 0.3–1.2)
Total Protein: 7.3 g/dL (ref 6.5–8.1)

## 2021-06-13 LAB — BLOOD GAS, VENOUS
Acid-base deficit: 0.8 mmol/L (ref 0.0–2.0)
Bicarbonate: 22.8 mmol/L (ref 20.0–28.0)
O2 Saturation: 82.3 %
Patient temperature: 98.6
pCO2, Ven: 35.5 mmHg — ABNORMAL LOW (ref 44.0–60.0)
pH, Ven: 7.424 (ref 7.250–7.430)
pO2, Ven: 52.8 mmHg — ABNORMAL HIGH (ref 32.0–45.0)

## 2021-06-13 LAB — TSH: TSH: 3.85 u[IU]/mL (ref 0.350–4.500)

## 2021-06-13 LAB — AMMONIA: Ammonia: 49 umol/L — ABNORMAL HIGH (ref 9–35)

## 2021-06-13 LAB — TROPONIN I (HIGH SENSITIVITY)
Troponin I (High Sensitivity): 14 ng/L (ref <18)
Troponin I (High Sensitivity): 13 ng/L (ref <18)

## 2021-06-13 LAB — HIV ANTIBODY (ROUTINE TESTING W REFLEX): HIV Screen 4th Generation wRfx: NONREACTIVE

## 2021-06-13 MED ORDER — MOMETASONE FURO-FORMOTEROL FUM 200-5 MCG/ACT IN AERO
2.0000 | INHALATION_SPRAY | Freq: Two times a day (BID) | RESPIRATORY_TRACT | Status: DC
  Administered 2021-06-14 – 2021-06-19 (×5): 2 via RESPIRATORY_TRACT
  Filled 2021-06-13 (×2): qty 8.8

## 2021-06-13 MED ORDER — METOPROLOL TARTRATE 5 MG/5ML IV SOLN
5.0000 mg | INTRAVENOUS | Status: DC | PRN
  Administered 2021-06-13: 5 mg via INTRAVENOUS
  Filled 2021-06-13: qty 5

## 2021-06-13 MED ORDER — HYDRALAZINE HCL 20 MG/ML IJ SOLN: 10.0000 mg | INTRAMUSCULAR | Status: DC | PRN

## 2021-06-13 MED ORDER — DEXTROSE-NACL 5-0.45 % IV SOLN: INTRAVENOUS | Status: DC

## 2021-06-13 MED ORDER — CARVEDILOL 3.125 MG PO TABS
3.1250 mg | ORAL_TABLET | Freq: Two times a day (BID) | ORAL | Status: DC
  Administered 2021-06-14 – 2021-06-19 (×9): 3.125 mg via ORAL
  Filled 2021-06-13 (×10): qty 1

## 2021-06-13 MED ORDER — FUROSEMIDE 20 MG PO TABS
20.0000 mg | ORAL_TABLET | Freq: Every day | ORAL | Status: DC
  Administered 2021-06-14 – 2021-06-19 (×5): 20 mg via ORAL
  Filled 2021-06-13 (×6): qty 1

## 2021-06-13 MED ORDER — HALOPERIDOL LACTATE 5 MG/ML IJ SOLN: 2.0000 mg | Freq: Four times a day (QID) | INTRAMUSCULAR | Status: DC | PRN

## 2021-06-13 MED ORDER — MEGESTROL ACETATE 40 MG/ML PO SUSP
600.0000 mg | Freq: Every day | ORAL | Status: DC
  Filled 2021-06-13: qty 15

## 2021-06-13 MED ORDER — GUAIFENESIN 100 MG/5ML PO LIQD
5.0000 mL | ORAL | Status: DC | PRN
  Administered 2021-06-16: 5 mL via ORAL
  Filled 2021-06-13: qty 10

## 2021-06-13 MED ORDER — HALOPERIDOL LACTATE 5 MG/ML IJ SOLN
2.0000 mg | Freq: Four times a day (QID) | INTRAMUSCULAR | Status: DC | PRN
  Administered 2021-06-13 – 2021-06-16 (×7): 2 mg via INTRAVENOUS
  Filled 2021-06-13 (×9): qty 1

## 2021-06-13 MED ORDER — UMECLIDINIUM BROMIDE 62.5 MCG/ACT IN AEPB
1.0000 | INHALATION_SPRAY | Freq: Every day | RESPIRATORY_TRACT | Status: DC
  Filled 2021-06-13: qty 7

## 2021-06-13 MED ORDER — SENNOSIDES-DOCUSATE SODIUM 8.6-50 MG PO TABS: 1.0000 | ORAL_TABLET | Freq: Every evening | ORAL | Status: DC | PRN

## 2021-06-13 MED ORDER — APIXABAN 2.5 MG PO TABS
2.5000 mg | ORAL_TABLET | Freq: Two times a day (BID) | ORAL | Status: DC
  Administered 2021-06-13 – 2021-06-19 (×10): 2.5 mg via ORAL
  Filled 2021-06-13 (×13): qty 1

## 2021-06-13 MED ORDER — LACTULOSE 10 GM/15ML PO SOLN
20.0000 g | Freq: Three times a day (TID) | ORAL | Status: DC
  Administered 2021-06-13 – 2021-06-18 (×9): 20 g via ORAL
  Filled 2021-06-13 (×16): qty 30

## 2021-06-13 MED ORDER — POTASSIUM CHLORIDE CRYS ER 20 MEQ PO TBCR
20.0000 meq | EXTENDED_RELEASE_TABLET | Freq: Every day | ORAL | Status: DC
  Administered 2021-06-14 – 2021-06-19 (×4): 20 meq via ORAL
  Filled 2021-06-13 (×6): qty 1

## 2021-06-13 MED ORDER — FUROSEMIDE 10 MG/ML IJ SOLN
20.0000 mg | Freq: Once | INTRAMUSCULAR | Status: AC
  Administered 2021-06-13: 20 mg via INTRAVENOUS
  Filled 2021-06-13: qty 4

## 2021-06-13 MED ORDER — SODIUM CHLORIDE 0.9 % IV SOLN
1.0000 g | INTRAVENOUS | Status: DC
  Administered 2021-06-13 – 2021-06-18 (×6): 1 g via INTRAVENOUS
  Filled 2021-06-13 (×6): qty 10

## 2021-06-13 MED ORDER — MEGESTROL ACETATE 400 MG/10ML PO SUSP
600.0000 mg | Freq: Every day | ORAL | Status: DC
  Administered 2021-06-14 – 2021-06-19 (×4): 600 mg via ORAL
  Filled 2021-06-13 (×7): qty 20

## 2021-06-13 MED ORDER — BUDESON-GLYCOPYRROL-FORMOTEROL 160-9-4.8 MCG/ACT IN AERO: 2.0000 | INHALATION_SPRAY | Freq: Two times a day (BID) | RESPIRATORY_TRACT | Status: DC

## 2021-06-13 MED ORDER — UMECLIDINIUM BROMIDE 62.5 MCG/ACT IN AEPB
1.0000 | INHALATION_SPRAY | Freq: Every day | RESPIRATORY_TRACT | Status: DC
  Administered 2021-06-14 – 2021-06-19 (×4): 1 via RESPIRATORY_TRACT
  Filled 2021-06-13: qty 7

## 2021-06-13 MED ORDER — IPRATROPIUM-ALBUTEROL 0.5-2.5 (3) MG/3ML IN SOLN
3.0000 mL | RESPIRATORY_TRACT | Status: DC | PRN
  Administered 2021-06-16 – 2021-06-17 (×2): 3 mL via RESPIRATORY_TRACT
  Filled 2021-06-13 (×2): qty 3

## 2021-06-13 MED ORDER — MEGESTROL ACETATE 40 MG/ML PO SUSP: 600.0000 mg | Freq: Every day | ORAL | Status: DC

## 2021-06-13 MED ORDER — MORPHINE SULFATE (PF) 2 MG/ML IV SOLN
2.0000 mg | Freq: Once | INTRAVENOUS | Status: AC
  Administered 2021-06-13: 1 mg via INTRAVENOUS
  Filled 2021-06-13: qty 1

## 2021-06-13 NOTE — Progress Notes (Signed)
Patient had not urinated since coming to the floor this afternoon and reported PVR of 375 from ED before coming to floor.  During repositioning for I&O cath, patient voided  - unable to measure.  I&O cath still performed with 200 cc resulted.  Patient remains lethargic and drowsy.  Attempted sip of water but patient holding fluid in mouth for prolonged time.  Unable to safely give PO meds right now.  Patient quickly back asleep after finishing cath.

## 2021-06-13 NOTE — Progress Notes (Signed)
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  But over the last 48 hours prior to admission she had become increasingly lethargic therefore brought to the hospital.  She is also had some anasarca, mild transaminitis.  Ammonia level 49.  CT head was unremarkable.   Assessment & Plan:   Principal Problem:   Acute encephalopathy Active Problems:   Metastatic breast cancer (HCC)   Asthma   Acute metabolic encephalopathy -Etiology is exactly unclear, possible polypharmacy versus underlying urinary tract infection.  Ammonia is mildly elevated therefore lactulose has been started.  CT head is negative. -Neurochecks.  Currently getting home gabapentin, Cymbalta, tramadol, lorazepam is on hold - Check TSH.  Bladder scan -IV Haldol as needed.  Soft restraints if necessary for staff and patient safety   Urinary tract infection - Send urine cultures.  Empiric IV Rocephin.  May need CT abdomen pelvis without contrast once she appears to be more calm to rule out any renal stone/pyelonephritis.  Peripheral edema, essential hypertension Herceptin induced mild cardiomyopathy; 65%, G1DD - Likely from cardiomyopathy secondary to Herceptin.  Continue Lasix -Echo done 03/2021-EF 65%, grade 1 DD  Hyponatremia - Suspect from hypovolemia.  Continue on Lasix  Transaminitis - Suspect from metastatic disease.  Continue to trend LFTs for now  Left upper extremity DVT - On Eliquis  Anemia of chronic disease - On chemotherapy.  Monitor hemoglobin.  Teresa obvious evidence of blood loss  Metastatic breast cancer - Followed by Dr. Burr Medico from oncology  History of asthma - As needed bronchodilators.  Dulera twice daily    DVT prophylaxis: Eliquis Code  Status: DNR Family Communication: Updated patient's family at bedside and over the phone-sister, niece and daughter  Patient needs to remain in the hospital today due to significant amount of agitation and confusion.  She was nowhere close to baseline.  Not safe for discharge.     Subjective: As I walked in the room patient was quite agitated trying to get out of bed pulling on her IV lines.  Family was present at bedside and also called patient's niece over the phone.  Haldol was given while I was in patient's room and before I left she started resting comfortably with stable vital signs.  Family tells me that patient has been complaining of left-sided flank pain for the past couple of days and generally less when her confusion had started.  Review of Systems Otherwise negative except as per HPI, including: I am not able to obtain any thorough history from the patient as she is very agitated.  Examination: Constitutional: Significantly agitated Respiratory: Clear to auscultation bilaterally Cardiovascular: Normal sinus rhythm, Teresa rubs Abdomen: Nontender nondistended good bowel sounds Musculoskeletal: Teresa edema noted Skin: Teresa rashes seen Neurologic: Grossly moving all extremities but unable to perform full neuro exam due to severe agitation Psychiatric: Very agitated Objective: Vitals:   06/13/21 0430 06/13/21 0500 06/13/21 0530 06/13/21 0700  BP: 121/84 104/85 107/79 105/85  Pulse: 89 82 85 84  Resp: 11  12 12   Temp:      TempSrc:      SpO2: 97% 100% 99% 99%   Teresa intake or output data in the 24 hours ending 06/13/21 0739 There were Teresa vitals filed for this visit.   Data Reviewed:   CBC:  Recent Labs  Lab 06/13/21 0022  WBC 10.0  NEUTROABS 6.9  HGB 11.6*  HCT 33.9*  MCV 81.3  PLT 588   Basic Metabolic Panel: Recent Labs  Lab 06/13/21 0022  NA 128*  K 4.7  CL 96*  CO2 22  GLUCOSE 91  BUN 14  CREATININE 1.18*  CALCIUM 9.4   GFR: Estimated Creatinine  Clearance: 46.3 mL/min (A) (by C-G formula based on SCr of 1.18 mg/dL (H)). Liver Function Tests: Recent Labs  Lab 06/13/21 0022  AST 299*  ALT 113*  ALKPHOS 357*  BILITOT 6.0*  PROT 7.3  ALBUMIN 3.1*   Teresa results for input(s): LIPASE, AMYLASE in the last 168 hours. Recent Labs  Lab 06/13/21 0022  AMMONIA 49*   Coagulation Profile: Teresa results for input(s): INR, PROTIME in the last 168 hours. Cardiac Enzymes: Teresa results for input(s): CKTOTAL, CKMB, CKMBINDEX, TROPONINI in the last 168 hours. BNP (last 3 results) Teresa results for input(s): PROBNP in the last 8760 hours. HbA1C: Teresa results for input(s): HGBA1C in the last 72 hours. CBG: Teresa results for input(s): GLUCAP in the last 168 hours. Lipid Profile: Teresa results for input(s): CHOL, HDL, LDLCALC, TRIG, CHOLHDL, LDLDIRECT in the last 72 hours. Thyroid Function Tests: Teresa results for input(s): TSH, T4TOTAL, FREET4, T3FREE, THYROIDAB in the last 72 hours. Anemia Panel: Teresa results for input(s): VITAMINB12, FOLATE, FERRITIN, TIBC, IRON, RETICCTPCT in the last 72 hours. Sepsis Labs: Teresa results for input(s): PROCALCITON, LATICACIDVEN in the last 168 hours.  Recent Results (from the past 240 hour(s))  Resp Panel by RT-PCR (Flu A&B, Covid) Nasopharyngeal Swab     Status: None   Collection Time: 06/13/21  3:50 AM   Specimen: Nasopharyngeal Swab; Nasopharyngeal(NP) swabs in vial transport medium  Result Value Ref Range Status   SARS Coronavirus 2 by RT PCR NEGATIVE NEGATIVE Final    Comment: (NOTE) SARS-CoV-2 target nucleic acids are NOT DETECTED.  The SARS-CoV-2 RNA is generally detectable in upper respiratory specimens during the acute phase of infection. The lowest concentration of SARS-CoV-2 viral copies this assay can detect is 138 copies/mL. A negative result does not preclude SARS-Cov-2 infection and should not be used as the sole basis for treatment or other patient management decisions. A negative result may occur with   improper specimen collection/handling, submission of specimen other than nasopharyngeal swab, presence of viral mutation(s) within the areas targeted by this assay, and inadequate number of viral copies(<138 copies/mL). A negative result must be combined with clinical observations, patient history, and epidemiological information. The expected result is Negative.  Fact Sheet for Patients:  EntrepreneurPulse.com.au  Fact Sheet for Healthcare Providers:  IncredibleEmployment.be  This test is Teresa t yet approved or cleared by the [place] FDA and  has been authorized for detection and/or diagnosis of SARS-CoV-2 by FDA under an Emergency Use Authorization (EUA). This EUA will remain  in effect (meaning this test can be used) for the duration of the COVID-19 declaration under Section 564(b)(1) of the Act, 21 U.S.C.section 360bbb-3(b)(1), unless the authorization is terminated  or revoked sooner.       Influenza A by PCR NEGATIVE NEGATIVE Final   Influenza B by PCR NEGATIVE NEGATIVE Final    Comment: (NOTE) The Xpert Xpress SARS-CoV-2/FLU/RSV plus assay is intended as an aid in the diagnosis of influenza from Nasopharyngeal swab specimens and should not be used as a sole basis for treatment. Nasal washings and aspirates are unacceptable for Xpert Xpress SARS-CoV-2/FLU/RSV testing.  Fact Sheet for  Patients: EntrepreneurPulse.com.au  Fact Sheet for Healthcare Providers: IncredibleEmployment.be  This test is not yet approved or cleared by the [place] FDA and has been authorized for detection and/or diagnosis of SARS-CoV-2 by FDA under an Emergency Use Authorization (EUA). This EUA will remain in effect (meaning this test can be used) for the duration of the COVID-19 declaration under Section 564(b)(1) of the Act, 21 U.S.C. section 360bbb-3(b)(1), unless the authorization is terminated  or revoked.  Performed at [facility], [place] [address] [place], [place] [address]          Radiology Studies: CT Head Wo Contrast  Result Date: 06/13/2021 CLINICAL DATA:  Mental status change, unknown cause EXAM: CT HEAD WITHOUT CONTRAST TECHNIQUE: Contiguous axial images were obtained from the base of the skull through the vertex without intravenous contrast. COMPARISON:  05/25/2021 FINDINGS: Brain: Teresa evidence of acute infarction, hemorrhage, cerebral edema, mass, mass effect, or midline shift. Teresa hydrocephalus or extra-axial fluid collection. Periventricular white matter changes, likely the sequela of chronic small vessel ischemic disease. Vascular: Teresa hyperdense vessel. Skull: Normal. Negative for fracture or focal lesion. Sinuses/Orbits: Teresa acute finding. Other: The mastoid air cells are well aerated. IMPRESSION: Teresa acute intracranial process. Electronically Signed   By: Merilyn Baba M.D.   On: 06/13/2021 03:21   DG Chest Port 1 View  Result Date: 06/13/2021 CLINICAL DATA:  Increased weakness and body pain. EXAM: PORTABLE CHEST 1 VIEW COMPARISON:  May 25, 2021 FINDINGS: There is stable left-sided venous Port-A-Cath positioning. The heart size and mediastinal contours are within normal limits. Low lung volumes are seen. Both lungs are clear. The visualized skeletal structures are unremarkable. IMPRESSION: Teresa active disease. Electronically Signed   By: Virgina Norfolk M.D.   On: 06/13/2021 00:43        Scheduled Meds:  apixaban  2.5 mg Oral BID   Budeson-Glycopyrrol-Formoterol  2 puff Inhalation BID   carvedilol  3.125 mg Oral BID WC   furosemide  20 mg Intravenous Once   furosemide  20 mg Oral Daily   lactulose  20 g Oral TID   megestrol  600 mg Oral Daily   mometasone-formoterol  2 puff Inhalation BID   potassium chloride SA  20 mEq Oral Daily   Continuous Infusions:   LOS: 0 days   Time spent= 35 mins    Aarushi Hemric Arsenio Loader, MD [facility]  If 7PM-7AM, please contact night-coverage  06/13/2021, 7:39 AM

## 2021-06-13 NOTE — H&P (Signed)
History and Physical    Teresa Hays:[id number] DOB: 1956/01/14 DOA: 06/12/2021  PCP: Teresa Fee, DO  Teresa Hays coming from: Home.  History obtained from Teresa Hays's sister.  Chief Complaint: Lethargic.  HPI: Teresa Hays is a 65 y.o. 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  Was brought to the ER.  Teresa Hays also has been having increasing peripheral edema.  ED Course: In the ER Teresa Hays is lethargic but following commands CT head unremarkable.  VBG shows PCO2 of 35 labs show sodium 128 worsening LFTs with AST of 299 and ALT of 113.  High sensitive troponins were negative.  Hemoglobin 11.6 ammonia level 49.  Teresa Hays was given morphine 4 mg IV for pain and by the time I was examining Teresa Hays was lethargic after receiving morphine.  COVID test negative.  Review of Systems: As per HPI, rest all negative.   Past Medical History:  Diagnosis Date   GERD (gastroesophageal reflux disease)    Hypertension    Personal history of chemotherapy    rt breast ca with mets to liver dx'd 03/2018    Past Surgical History:  Procedure Laterality Date   BREAST BIOPSY Right 03/24/2018   CA x3   BREAST BIOPSY Left 03/28/2018   neg   BREAST BIOPSY Left 03/30/2018   neg   COLONOSCOPY     ESOPHAGOGASTRODUODENOSCOPY ENDOSCOPY     IR IMAGING GUIDED PORT INSERTION  04/04/2018     reports that she has never smoked. She has never used smokeless tobacco. She reports that she does not drink alcohol and does not use drugs.  No Known Allergies  Family History  Problem Relation Age of Onset   Diabetes Mother    Cancer Sister 44       breast cancer   Breast cancer Sister    Rheum arthritis Sister      Prior to Admission medications   Medication Sig Start Date End Date Taking? Authorizing Provider  apixaban (ELIQUIS) 2.5 MG TABS tablet Take 1 tablet (2.5 mg total) by mouth 2 (two) times daily. 06/05/21  Yes [name], MD  Budeson-Glycopyrrol-Formoterol 160-9-4.8 MCG/ACT AERO INHALE 2 PUFFS BY MOUTH INTO THE LUNGS IN THE MORNING AND AT BEDTIME. Teresa Hays taking differently: Inhale 2 puffs into the lungs 2 (two) times daily. 09/11/20 09/11/21 Yes Icard, Octavio Graves, DO  budesonide-formoterol (SYMBICORT) 160-4.5 MCG/ACT inhaler Inhale 2 puffs into the lungs daily as needed (for shortness of breath/wheezing).   Yes [provider]  calcium carbonate (OSCAL) 1500 (600 Ca) MG TABS tablet Take 600 mg of elemental calcium by mouth daily.   Yes [provider]  carvedilol (COREG) 3.125 MG tablet TAKE 1 TABLET BY MOUTH TWICE DAILY. REPLACES ATENOLOL Teresa Hays taking differently: Take 3.125 mg by mouth 2 (two) times daily with a meal. 02/04/21 02/04/22 Yes Bensimhon, Shaune Pascal, MD  famotidine (PEPCID) 10 MG tablet Take 10 mg by mouth daily as needed for heartburn or indigestion.   Yes [provider]  feeding supplement (ENSURE ENLIVE / ENSURE PLUS) LIQD Take 237 mLs by mouth 3 (three) times daily between meals. 05/31/21  Yes Florencia Reasons, MD  furosemide (LASIX) 20 MG tablet Take 1 tablet (20 mg total) by mouth daily. 06/05/21  Yes [name],  MD  gabapentin (NEURONTIN) 300 MG capsule Take 1 capsule (300 mg total) by mouth 3 (three) times daily. Teresa Hays taking differently: Take 600 mg by mouth every evening. 05/31/21 05/31/22 Yes Florencia Reasons, MD  lidocaine (XYLOCAINE) 2 % jelly APPLY OVER PORT A CATH TOPICALLY 1 HOUR BEFORE PORT BEING ACCESSED AS NEEDED Teresa Hays taking differently: Apply 1 application topically daily as needed (port access). 07/04/20 07/04/21 Yes [name], MD  LORazepam (ATIVAN) 0.5 MG tablet Take 1 tablet (0.5 mg total) by mouth every 8 (eight) hours as needed for anxiety.  05/31/21  Yes Florencia Reasons, MD  megestrol (MEGACE) 40 MG/ML suspension Take 15 mLs (600 mg total) by mouth daily. 06/05/21  Yes [name], MD  Multiple Vitamin (MULTIVITAMIN WITH MINERALS) TABS tablet Take 1 tablet by mouth daily. 05/31/21  Yes Florencia Reasons, MD  potassium chloride SA (KLOR-CON) 20 MEQ tablet TAKE 1 TABLET BY MOUTH ONCE A DAY Teresa Hays taking differently: Take 20 mEq by mouth daily. 06/05/21 06/05/22 Yes [name], MD  prochlorperazine (COMPAZINE) 10 MG tablet TAKE 1 TABLET BY MOUTH EVERY 6 HOURS AS NEEDED FOR NAUSEA OR VOMITING Teresa Hays taking differently: Take 10 mg by mouth every 6 (six) hours as needed for nausea. 08/08/20 08/08/21 Yes [name], MD  traMADol (ULTRAM) 50 MG tablet Take 1 tablet (50 mg total) by mouth every 6 (six) hours as needed for moderate pain or severe pain. 12/29/20  Yes [name], MD  DULoxetine (CYMBALTA) 20 MG capsule Take 1 capsule (20 mg total) by mouth at bedtime. Teresa Hays not taking: Reported on 06/13/2021 05/31/21 05/31/22  Florencia Reasons, MD  lactulose (CHRONULAC) 10 GM/15ML solution Take 45 mLs (30 g total) by mouth daily. Teresa Hays not taking: Reported on 06/13/2021 05/31/21   Florencia Reasons, MD  meclizine (ANTIVERT) 25 MG tablet TAKE 1/2 TABLET BY MOUTH 3 TIMES DAILY AS NEEDED FOR DIZZINESS Teresa Hays not taking: Reported on 06/13/2021 02/20/21 02/20/22  [name], MD  montelukast (SINGULAIR) 10 MG tablet TAKE 1 TABLET (10 MG TOTAL) BY MOUTH AT BEDTIME. Teresa Hays not taking: Reported on 06/13/2021 11/24/20 11/24/21  Martyn Ehrich, NP  ondansetron (ZOFRAN) 8 MG tablet TAKE 1 TABLET BY MOUTH EVERY 8 HOURS AS NEEDED FOR NAUSEA OR VOMITING Teresa Hays not taking: Reported on 06/13/2021 08/08/20 08/08/21  [name], MD  albuterol (VENTOLIN HFA) 108 (90 Base) MCG/ACT inhaler Inhale 1-2 puffs into the lungs every 6 (six) hours as needed for wheezing or shortness of breath. 08/21/20 09/19/20  Garner Nash, DO    Physical Exam: Constitutional: Moderately built and nourished. Vitals:    06/13/21 0400 06/13/21 0430 06/13/21 0500 06/13/21 0530  BP: 108/84 121/84 104/85 107/79  Pulse: 87 89 82 85  Resp:  11  12  Temp:      TempSrc:      SpO2: 99% 97% 100% 99%   Eyes: Anicteric no pallor. ENMT: No discharge from the ears eyes nose and mouth. Neck: No mass felt no neck rigidity. Respiratory: No rhonchi or crepitations. Cardiovascular: S1-S2 heard. Abdomen: Soft nontender bowel sound present. Musculoskeletal: Bilateral lower extremity edema present. Skin: No rash. Neurologic: Teresa Hays is lethargic but oriented to her Teresa Hays follows commands moving all extremities. Psychiatric: Lethargic.   Labs on Admission: I have personally reviewed following labs and imaging studies  CBC: Recent Labs  Lab 06/13/21 0022  WBC 10.0  NEUTROABS 6.9  HGB 11.6*  HCT 33.9*  MCV 81.3  PLT 093   Basic Metabolic Panel: Recent Labs  Lab 06/13/21 0022  NA 128*  K 4.7  CL 96*  CO2 22  GLUCOSE 91  BUN 14  CREATININE 1.18*  CALCIUM 9.4   GFR: Estimated Creatinine Clearance: 46.3 mL/min (A) (by C-G formula based on SCr of 1.18 mg/dL (H)). Liver Function Tests: Recent Labs  Lab 06/13/21 0022  AST 299*  ALT 113*  ALKPHOS 357*  BILITOT 6.0*  PROT 7.3  ALBUMIN 3.1*   No results for input(s): LIPASE, AMYLASE in the last 168 hours. Recent Labs  Lab 06/13/21 0022  AMMONIA 49*   Coagulation Profile: No results for input(s): INR, PROTIME in the last 168 hours. Cardiac Enzymes: No results for input(s): CKTOTAL, CKMB, CKMBINDEX, TROPONINI in the last 168 hours. BNP (last 3 results) No results for input(s): PROBNP in the last 8760 hours. HbA1C: No results for input(s): HGBA1C in the last 72 hours. CBG: No results for input(s): GLUCAP in the last 168 hours. Lipid Profile: No results for input(s): CHOL, HDL, LDLCALC, TRIG, CHOLHDL, LDLDIRECT in the last 72 hours. Thyroid Function Tests: No results for input(s): TSH, T4TOTAL, FREET4, T3FREE, THYROIDAB in the last 72  hours. Anemia Panel: No results for input(s): VITAMINB12, FOLATE, FERRITIN, TIBC, IRON, RETICCTPCT in the last 72 hours. Urine analysis:    Component Value Date/Time   COLORURINE YELLOW 05/26/2021 [place] 05/26/2021 0514   LABSPEC 1.012 05/26/2021 0514   PHURINE 5.0 05/26/2021 0514   GLUCOSEU NEGATIVE 05/26/2021 0514   HGBUR NEGATIVE 05/26/2021 0514   BILIRUBINUR NEGATIVE 05/26/2021 0514   KETONESUR 20 (A) 05/26/2021 0514   PROTEINUR NEGATIVE 05/26/2021 0514   NITRITE NEGATIVE 05/26/2021 0514   LEUKOCYTESUR NEGATIVE 05/26/2021 0514   Sepsis Labs: @LABRCNTIP (procalcitonin:4,lacticidven:4) ) Recent Results (from the past 240 hour(s))  Resp Panel by RT-PCR (Flu A&B, Covid) Nasopharyngeal Swab     Status: None   Collection Time: 06/13/21  3:50 AM   Specimen: Nasopharyngeal Swab; Nasopharyngeal(NP) swabs in vial transport medium  Result Value Ref Range Status   SARS Coronavirus 2 by RT PCR NEGATIVE NEGATIVE Final    Comment: (NOTE) SARS-CoV-2 target nucleic acids are NOT DETECTED.  The SARS-CoV-2 RNA is generally detectable in upper respiratory specimens during the acute phase of infection. The lowest concentration of SARS-CoV-2 viral copies this assay can detect is 138 copies/mL. A negative result does not preclude SARS-Cov-2 infection and should not be used as the sole basis for treatment or other Teresa Hays management decisions. A negative result may occur with  improper specimen collection/handling, submission of specimen other than nasopharyngeal swab, presence of viral mutation(s) within the areas targeted by this assay, and inadequate number of viral copies(<138 copies/mL). A negative result must be combined with clinical observations, Teresa Hays history, and epidemiological information. The expected result is Negative.  Fact Sheet for Patients:  EntrepreneurPulse.com.au  Fact Sheet for Healthcare Providers:   IncredibleEmployment.be  This test is no t yet approved or cleared by the [place] FDA and  has been authorized for detection and/or diagnosis of SARS-CoV-2 by FDA under an Emergency Use Authorization (EUA). This EUA will remain  in effect (meaning this test can be used) for the duration of the COVID-19 declaration under Section 564(b)(1) of the Act, 21 U.S.C.section 360bbb-3(b)(1), unless the authorization is terminated  or revoked sooner.       Influenza A by PCR NEGATIVE NEGATIVE Final   Influenza B by PCR NEGATIVE NEGATIVE Final    Comment: (NOTE) The Xpert Xpress SARS-CoV-2/FLU/RSV plus assay is intended as an aid in the diagnosis of  influenza from Nasopharyngeal swab specimens and should not be used as a sole basis for treatment. Nasal washings and aspirates are unacceptable for Xpert Xpress SARS-CoV-2/FLU/RSV testing.  Fact Sheet for Patients: EntrepreneurPulse.com.au  Fact Sheet for Healthcare Providers: IncredibleEmployment.be  This test is not yet approved or cleared by the [place] FDA and has been authorized for detection and/or diagnosis of SARS-CoV-2 by FDA under an Emergency Use Authorization (EUA). This EUA will remain in effect (meaning this test can be used) for the duration of the COVID-19 declaration under Section 564(b)(1) of the Act, 21 U.S.C. section 360bbb-3(b)(1), unless the authorization is terminated or revoked.  Performed at [facility], [place] [address], [place], [place] [address]      Radiological Exams on Admission: CT Head Wo Contrast  Result Date: 06/13/2021 CLINICAL DATA:  Mental status change, unknown cause EXAM: CT HEAD WITHOUT CONTRAST TECHNIQUE: Contiguous axial images were obtained from the base of the skull through the vertex without intravenous contrast. COMPARISON:  05/25/2021 FINDINGS: Brain: No evidence of acute infarction, hemorrhage, cerebral  edema, mass, mass effect, or midline shift. No hydrocephalus or extra-axial fluid collection. Periventricular white matter changes, likely the sequela of chronic small vessel ischemic disease. Vascular: No hyperdense vessel. Skull: Normal. Negative for fracture or focal lesion. Sinuses/Orbits: No acute finding. Other: The mastoid air cells are well aerated. IMPRESSION: No acute intracranial process. Electronically Signed   By: Merilyn Baba M.D.   On: 06/13/2021 03:21   DG Chest Port 1 View  Result Date: 06/13/2021 CLINICAL DATA:  Increased weakness and body pain. EXAM: PORTABLE CHEST 1 VIEW COMPARISON:  May 25, 2021 FINDINGS: There is stable left-sided venous Port-A-Cath positioning. The heart size and mediastinal contours are within normal limits. Low lung volumes are seen. Both lungs are clear. The visualized skeletal structures are unremarkable. IMPRESSION: No active disease. Electronically Signed   By: Virgina Norfolk M.D.   On: 06/13/2021 00:43    EKG: Independently reviewed.  Normal sinus rhythm.  Assessment/Plan Principal Problem:   Acute encephalopathy Active Problems:   Metastatic breast cancer (HCC)   Asthma    Acute encephalopathy cause not clear.  For now I am holding off Teresa Hays's lorazepam, gabapentin and tramadol.  Place Teresa Hays on lactulose and closely monitor mental status. Peripheral edema likely from cardiomyopathy induced from Herceptin on Lasix.  We will give 2 doses IV. History of left upper arm DVT on Eliquis. Hyponatremia likely from fluid overload on Lasix now.  Follow metabolic panel. 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 Metastatic breast cancer into the liver followed by oncologist. Worsening LFTs likely from metastatic disease. Asthma continue home inhalers presently not wheezing.   DVT prophylaxis: Apixaban. Code Status: DNR. Family Communication: Teresa Hays's sister at the bedside. Disposition Plan: To be determined. Consults called:  Palliative care. Admission status: Observation.   Rise Patience MD [facility] Pager [phone].  If 7PM-7AM, please contact night-coverage [email or web address] Password [facility]  06/13/2021, 6:37 AM

## 2021-06-14 DIAGNOSIS — R531 Weakness: Secondary | ICD-10-CM

## 2021-06-14 LAB — COMPREHENSIVE METABOLIC PANEL
ALT: 87 U/L — ABNORMAL HIGH (ref 0–44)
AST: 227 U/L — ABNORMAL HIGH (ref 15–41)
Albumin: 2.5 g/dL — ABNORMAL LOW (ref 3.5–5.0)
Alkaline Phosphatase: 243 U/L — ABNORMAL HIGH (ref 38–126)
Anion gap: 9 (ref 5–15)
BUN: 12 mg/dL (ref 8–23)
CO2: 21 mmol/L — ABNORMAL LOW (ref 22–32)
Calcium: 8.4 mg/dL — ABNORMAL LOW (ref 8.9–10.3)
Chloride: 100 mmol/L (ref 98–111)
Creatinine, Ser: 0.82 mg/dL (ref 0.44–1.00)
GFR, Estimated: >60 mL/min (ref >60)
Glucose, Bld: 130 mg/dL — ABNORMAL HIGH (ref 70–99)
Potassium: 3.3 mmol/L — ABNORMAL LOW (ref 3.5–5.1)
Sodium: 130 mmol/L — ABNORMAL LOW (ref 135–145)
Total Bilirubin: 5.3 mg/dL — ABNORMAL HIGH (ref 0.3–1.2)
Total Protein: 6 g/dL — ABNORMAL LOW (ref 6.5–8.1)

## 2021-06-14 LAB — CBC
HCT: 26.6 % — ABNORMAL LOW (ref 36.0–46.0)
Hemoglobin: 9.4 g/dL — ABNORMAL LOW (ref 12.0–15.0)
MCH: 28.1 pg (ref 26.0–34.0)
MCHC: 35.3 g/dL (ref 30.0–36.0)
MCV: 79.4 fL — ABNORMAL LOW (ref 80.0–100.0)
Platelets: 253 10*3/uL (ref 150–400)
RBC: 3.35 MIL/uL — ABNORMAL LOW (ref 3.87–5.11)
RDW: 18.6 % — ABNORMAL HIGH (ref 11.5–15.5)
WBC: 10.9 10*3/uL — ABNORMAL HIGH (ref 4.0–10.5)
nRBC: 0 % (ref 0.0–0.2)

## 2021-06-14 LAB — URINE CULTURE

## 2021-06-14 LAB — MAGNESIUM: Magnesium: 2 mg/dL (ref 1.7–2.4)

## 2021-06-14 LAB — LACTIC ACID, PLASMA: Lactic Acid, Venous: 2.2 mmol/L (ref 0.5–1.9)

## 2021-06-14 MED ORDER — CHLORHEXIDINE GLUCONATE CLOTH 2 % EX PADS: 6.0000 | MEDICATED_PAD | Freq: Every day | CUTANEOUS | Status: DC

## 2021-06-14 MED ORDER — CHLORHEXIDINE GLUCONATE CLOTH 2 % EX PADS
6.0000 | MEDICATED_PAD | Freq: Every day | CUTANEOUS | Status: DC
  Administered 2021-06-14 – 2021-06-19 (×6): 6 via TOPICAL

## 2021-06-14 MED ORDER — SODIUM CHLORIDE 0.9% FLUSH
10.0000 mL | INTRAVENOUS | Status: DC | PRN
  Administered 2021-06-19: 10 mL

## 2021-06-14 MED ORDER — MORPHINE SULFATE (PF) 2 MG/ML IV SOLN
2.0000 mg | Freq: Once | INTRAVENOUS | Status: AC
  Administered 2021-06-14: 23:00:00 2 mg via INTRAVENOUS
  Filled 2021-06-14: qty 1

## 2021-06-14 MED ORDER — SODIUM CHLORIDE 0.9% FLUSH: 10.0000 mL | Freq: Two times a day (BID) | INTRAVENOUS | Status: DC

## 2021-06-14 MED ORDER — DEXTROSE 5 % AND 0.45 % NACL IV BOLUS
250.0000 mL | Freq: Once | INTRAVENOUS | Status: AC
  Administered 2021-06-14: 08:00:00 250 mL via INTRAVENOUS

## 2021-06-14 NOTE — Consult Note (Signed)
Consultation Note Date: 06/14/2021   Patient Name: Teresa Hays  DOB: 05/11/1956  MRN: [id number]  Age / Sex: 65 y.o., female  PCP: [name], DO Referring Physician: Damita Lack, MD  Reason for Consultation: Establishing goals of care  HPI/Patient Profile: 65 y.o. female  with past medical history of  Metastatic breast cancer and cardiomyopathy, patient of Dr Burr Medico from oncology admitted on 06/12/2021 with acute metabolic encephalopathy, deemed likely secondary to infection UTI and possibly electrolyte abnormalities, admitted to hospital medicine service.   A palliative consultation has been requested for additional support and for goals of care discussions.   Clinical Assessment and Goals of Care: Patient is sitting up in bed, she is restless, she is awake but does not verbalize. Her sister Rosalyn Gess is at the bedside. Chart reviewed, discussed with sister at bedside. Introduced myself and palliative care as follows: Palliative medicine is specialized medical care for people living with serious illness. It focuses on providing relief from the symptoms and stress of a serious illness. The goal is to improve quality of life for both the patient and the family. Goals of care: Broad aims of medical therapy in relation to the patient's values and preferences. Our aim is to provide medical care aimed at enabling patients to achieve the goals that matter most to them, given the circumstances of their particular medical situation and their constraints.   Brief life review performed, patient's cancer history thus far was also discussed. Patient has one daughter, she has a lot of support from her siblings. Her sister Rosalyn Gess who is currently at the bedside is a hospice and Engineer, maintenance (IT) from [place], [place]. She is the primary historian who says that the patient had a good thanksgiving and  spent the day with her family, ate well and interacted with everyone. She has had worsening confusion since Thursday night. Patient was recently seen by outpatient palliative [facility] as per sister, she was seen by Dr Hollace Kinnier, Rupert Stacks documents noted, patient has a DNR form and MOST form completed, which has been reviewed by the undersigned.   Goals, wishes and values important to the patient and family as a unit attempted to be explored, see below.   NEXT OF KIN  Has 1 daughter, has sisters.   SUMMARY OF RECOMMENDATIONS    Agree with DNR Continue current mode of care: to remain on antibiotics and current treatments, to treat and potential reversible factors such as infections and electrolyte abnormalities.  Monitor hospital course to further determine next steps/disposition options, thank you for the consult.   Code Status/Advance Care Planning: DNR   Symptom Management:     Palliative Prophylaxis:  Delirium Protocol    Psycho-social/Spiritual:  Desire for further Chaplaincy support:yes Additional Recommendations: Caregiving  Support/Resources  Prognosis:  Unable to determine  Discharge Planning: To Be Determined      Primary Diagnoses: Present on Admission:  Acute encephalopathy  Asthma  Metastatic breast cancer ([place])  Encephalopathy acute   I have reviewed the medical record, interviewed the  patient and family, and examined the patient. The following aspects are pertinent.  Past Medical History:  Diagnosis Date   GERD (gastroesophageal reflux disease)    Hypertension    Personal history of chemotherapy    rt breast ca with mets to liver dx'd 03/2018   Social History   Socioeconomic History   Marital status: Single    Spouse name: Not on file   Number of children: Not on file   Years of education: Not on file   Highest education level: Not on file  Occupational History   Not on file  Tobacco Use   Smoking status: Never   Smokeless tobacco: Never   Vaping Use   Vaping Use: Never used  Substance and Sexual Activity   Alcohol use: No   Drug use: Never   Sexual activity: Not Currently  Other Topics Concern   Not on file  Social History Narrative   Not on file   Social Determinants of Health   Financial Resource Strain: Not on file  Food Insecurity: Not on file  Transportation Needs: Not on file  Physical Activity: Not on file  Stress: Not on file  Social Connections: Not on file   Family History  Problem Relation Age of Onset   Diabetes Mother    Cancer Sister 73       breast cancer   Breast cancer Sister    Rheum arthritis Sister    Scheduled Meds:  apixaban  2.5 mg Oral BID   carvedilol  3.125 mg Oral BID WC   Chlorhexidine Gluconate Cloth  6 each Topical Daily   furosemide  20 mg Oral Daily   lactulose  20 g Oral TID   megestrol  600 mg Oral Daily   mometasone-formoterol  2 puff Inhalation BID   potassium chloride SA  20 mEq Oral Daily   sodium chloride flush  10-40 mL Intracatheter Q12H   umeclidinium bromide  1 puff Inhalation Daily   Continuous Infusions:  cefTRIAXone (ROCEPHIN)  IV 1 g (06/13/21 1216)   dextrose 5 % and 0.45% NaCl 75 mL/hr at 06/14/21 0352   PRN Meds:.guaiFENesin, haloperidol lactate, hydrALAZINE, ipratropium-albuterol, metoprolol tartrate, senna-docusate, sodium chloride flush Medications Prior to Admission:  Prior to Admission medications   Medication Sig Start Date End Date Taking? Authorizing Provider  apixaban (ELIQUIS) 2.5 MG TABS tablet Take 1 tablet (2.5 mg total) by mouth 2 (two) times daily. 06/05/21  Yes [name], MD  Budeson-Glycopyrrol-Formoterol 160-9-4.8 MCG/ACT AERO INHALE 2 PUFFS BY MOUTH INTO THE LUNGS IN THE MORNING AND AT BEDTIME. Patient taking differently: Inhale 2 puffs into the lungs 2 (two) times daily. 09/11/20 09/11/21 Yes Icard, Octavio Graves, DO  budesonide-formoterol (SYMBICORT) 160-4.5 MCG/ACT inhaler Inhale 2 puffs into the lungs daily as needed (for shortness  of breath/wheezing).   Yes [provider]  calcium carbonate (OSCAL) 1500 (600 Ca) MG TABS tablet Take 600 mg of elemental calcium by mouth daily.   Yes [provider]  carvedilol (COREG) 3.125 MG tablet TAKE 1 TABLET BY MOUTH TWICE DAILY. REPLACES ATENOLOL Patient taking differently: Take 3.125 mg by mouth 2 (two) times daily with a meal. 02/04/21 02/04/22 Yes Bensimhon, Shaune Pascal, MD  famotidine (PEPCID) 10 MG tablet Take 10 mg by mouth daily as needed for heartburn or indigestion.   Yes [provider]  feeding supplement (ENSURE ENLIVE / ENSURE PLUS) LIQD Take 237 mLs by mouth 3 (three) times daily between meals. 05/31/21  Yes Florencia Reasons, MD  furosemide (LASIX) 20 MG tablet Take 1 tablet (20 mg total) by mouth daily. 06/05/21  Yes [name], MD  gabapentin (NEURONTIN) 300 MG capsule Take 1 capsule (300 mg total) by mouth 3 (three) times daily. Patient taking differently: Take 600 mg by mouth every evening. 05/31/21 05/31/22 Yes Florencia Reasons, MD  lidocaine (XYLOCAINE) 2 % jelly APPLY OVER PORT A CATH TOPICALLY 1 HOUR BEFORE PORT BEING ACCESSED AS NEEDED Patient taking differently: Apply 1 application topically daily as needed (port access). 07/04/20 07/04/21 Yes [name], MD  LORazepam (ATIVAN) 0.5 MG tablet Take 1 tablet (0.5 mg total) by mouth every 8 (eight) hours as needed for anxiety. 05/31/21  Yes Florencia Reasons, MD  megestrol (MEGACE) 40 MG/ML suspension Take 15 mLs (600 mg total) by mouth daily. 06/05/21  Yes [name], MD  Multiple Vitamin (MULTIVITAMIN WITH MINERALS) TABS tablet Take 1 tablet by mouth daily. 05/31/21  Yes Florencia Reasons, MD  potassium chloride SA (KLOR-CON) 20 MEQ tablet TAKE 1 TABLET BY MOUTH ONCE A DAY Patient taking differently: Take 20 mEq by mouth daily. 06/05/21 06/05/22 Yes [name], MD  prochlorperazine (COMPAZINE) 10 MG tablet TAKE 1 TABLET BY MOUTH EVERY 6 HOURS AS NEEDED FOR NAUSEA OR VOMITING Patient taking differently: Take 10 mg by mouth every 6  (six) hours as needed for nausea. 08/08/20 08/08/21 Yes [name], MD  traMADol (ULTRAM) 50 MG tablet Take 1 tablet (50 mg total) by mouth every 6 (six) hours as needed for moderate pain or severe pain. 12/29/20  Yes [name], MD  DULoxetine (CYMBALTA) 20 MG capsule Take 1 capsule (20 mg total) by mouth at bedtime. Patient not taking: Reported on 06/13/2021 05/31/21 05/31/22  Florencia Reasons, MD  lactulose (CHRONULAC) 10 GM/15ML solution Take 45 mLs (30 g total) by mouth daily. Patient not taking: Reported on 06/13/2021 05/31/21   Florencia Reasons, MD  meclizine (ANTIVERT) 25 MG tablet TAKE 1/2 TABLET BY MOUTH 3 TIMES DAILY AS NEEDED FOR DIZZINESS Patient not taking: Reported on 06/13/2021 02/20/21 02/20/22  [name], MD  montelukast (SINGULAIR) 10 MG tablet TAKE 1 TABLET (10 MG TOTAL) BY MOUTH AT BEDTIME. Patient not taking: Reported on 06/13/2021 11/24/20 11/24/21  Martyn Ehrich, NP  ondansetron (ZOFRAN) 8 MG tablet TAKE 1 TABLET BY MOUTH EVERY 8 HOURS AS NEEDED FOR NAUSEA OR VOMITING Patient not taking: Reported on 06/13/2021 08/08/20 08/08/21  [name], MD  albuterol (VENTOLIN HFA) 108 (90 Base) MCG/ACT inhaler Inhale 1-2 puffs into the lungs every 6 (six) hours as needed for wheezing or shortness of breath. 08/21/20 09/19/20  Garner Nash, DO   No Known Allergies Review of Systems Appears confused, nods head to some questions.   Physical Exam Restless, trying to lift off her covers, tries to get out of bed Does not verbalize Appears to have regular work of breathing Monitor at bedside, heart rate in 120s currently Moves all extremities Abdomen is not distended She does not have edema  Vital Signs: BP (!) 111/93   Pulse (!) 104   Temp 98.4 F (36.9 C)   Resp 13   Ht 5\' 4"  (1.626 m)   Wt 71.9 kg   SpO2 100%   BMI 27.21 kg/m  Pain Scale: Faces   Pain Score: Asleep   SpO2: SpO2: 100 % O2 Device:SpO2: 100 % O2 Flow Rate: .   IO: Intake/output summary:  Intake/Output Summary (Last 24 hours)  at 06/14/2021 1005 Last data filed at 06/13/2021 1744 Gross per 24 hour  Intake 195.87 ml  Output 200 ml  Net -4.13 ml    LBM:   Baseline Weight: Weight: 71.9 kg Most recent weight: Weight: 71.9 kg     Palliative Assessment/Data:   PPS 30%  Time In:  9 Time Out: 10  Time Total:  60  Greater than 50%  of this time was spent counseling and coordinating care related to the above assessment and plan.  Signed by: Loistine Chance, MD   Please contact Palliative Medicine Team phone at [phone] for questions and concerns from 7 Am to 7 PM, after 7 PM, please call primary service.   For individual provider: See Shea Evans

## 2021-06-14 NOTE — Progress Notes (Signed)
Patient has PAC on Lt. CW which wasn't accessed due to no order from oncologist. Talked patient's RN Margreta Journey regarding this matter and RN Margreta Journey notified to Dr. Reesa Chew regarding access the [facility]. Dr. Reesa Chew was okay to access it. Accessed PAC and sent morning blood labs. HS [employer]

## 2021-06-14 NOTE — Progress Notes (Signed)
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  But over the last 48 hours prior to admission she had become increasingly lethargic therefore brought to the hospital.  She is also had some anasarca, mild transaminitis.  Ammonia level 49.  CT head was unremarkable.  Assessment & Plan:   Principal Problem:   Acute encephalopathy Active Problems:   Metastatic breast cancer (HCC)   Asthma   Encephalopathy acute  Acute metabolic encephalopathy -Etiology is exactly unclear, possible polypharmacy versus underlying urinary tract infection, elevated Ammonia.  Ammonia is mildly elevated, Continue Ammonia. CT head is negative.  -Neurochecks.  Currently getting home gabapentin, Cymbalta, tramadol, lorazepam is on hold - TSH - normal. Bladder scan showed urinary retention s/p Straight Cath. Cont Bladder scans q8hrs for now.  -IV Haldol as needed.  Soft restraints if necessary for staff and patient safety  Urinary tract infection - Ucx- pending. Empiric IV Rocephin.  May need CT abdomen pelvis without contrast once she appears to be more calm to rule out any renal stone/pyelonephritis.  Peripheral edema, essential hypertension Herceptin induced mild cardiomyopathy; 65%, G1DD - Likely from cardiomyopathy secondary to Herceptin.  Continue Lasix -Echo done 03/2021-EF 65%, grade 1 DD  Hyponatremia Hypokalemia - Suspect from hypovolemia. Prn repletion.   Transaminitis - Suspect from metastatic disease.  Continue to trend LFTs for now  Left upper extremity DVT - On Eliquis  Anemia of chronic disease - On chemotherapy.  Monitor hemoglobin.  No obvious evidence of blood loss  Metastatic breast cancer - Followed by Dr. Burr Medico from oncology  History  of asthma - As needed bronchodilators.  Dulera twice daily    DVT prophylaxis: Eliquis Code Status: DNR Family Communication: Family at bedside.   Patient needs to remain in the hospital today due to significant amount of agitation and confusion.  She was nowhere close to baseline.  Not safe for discharge.     Subjective: Following very commands but very sluggish in her response. Had fever last night.   Review of Systems Otherwise negative except as per HPI, including: I am not able to obtain any thorough history from the patient as she is very agitated.  Examination: Constitutional: Not in acute distress Respiratory: Clear to auscultation bilaterally Cardiovascular: Normal sinus rhythm, no rubs Abdomen: Nontender nondistended good bowel sounds Musculoskeletal: No edema noted Skin: No rashes seen Neurologic: Grossly moving all the extremities. No focal Neuro.  Psychiatric: Mildly agitated.   Objective: Vitals:   06/14/21 0100 06/14/21 0300 06/14/21 0500 06/14/21 1030  BP: (!) 149/101  (!) 111/93 132/87  Pulse: (!) 111 (!) 114 (!) 104   Resp:  13 13 14   Temp:    98 F (36.7 C)  TempSrc:    Axillary  SpO2: 100% 100% 100%   Weight:      Height:        Intake/Output Summary (Last 24 hours) at 06/14/2021 1100 Last data filed at 06/13/2021 1744 Gross per 24 hour  Intake 195.87 ml  Output 200 ml  Net -4.13 ml   Filed Weights   06/13/21 1440  Weight: 71.9 kg     Data Reviewed:   CBC: Recent Labs  Lab 06/13/21 0022 06/14/21 0908  WBC 10.0 10.9*  NEUTROABS 6.9  --   HGB 11.6* 9.4*  HCT  33.9* 26.6*  MCV 81.3 79.4*  PLT 251 532   Basic Metabolic Panel: Recent Labs  Lab 06/13/21 0022 06/14/21 0908  NA 128* 130*  K 4.7 3.3*  CL 96* 100  CO2 22 21*  GLUCOSE 91 130*  BUN 14 12  CREATININE 1.18* 0.82  CALCIUM 9.4 8.4*  MG  --  2.0   GFR: Estimated Creatinine Clearance: 67.4 mL/min (by C-G formula based on SCr of 0.82 mg/dL). Liver Function  Tests: Recent Labs  Lab 06/13/21 0022 06/14/21 0908  AST 299* 227*  ALT 113* 87*  ALKPHOS 357* 243*  BILITOT 6.0* 5.3*  PROT 7.3 6.0*  ALBUMIN 3.1* 2.5*   No results for input(s): LIPASE, AMYLASE in the last 168 hours. Recent Labs  Lab 06/13/21 0022  AMMONIA 49*   Coagulation Profile: No results for input(s): INR, PROTIME in the last 168 hours. Cardiac Enzymes: No results for input(s): CKTOTAL, CKMB, CKMBINDEX, TROPONINI in the last 168 hours. BNP (last 3 results) No results for input(s): PROBNP in the last 8760 hours. HbA1C: No results for input(s): HGBA1C in the last 72 hours. CBG: No results for input(s): GLUCAP in the last 168 hours. Lipid Profile: No results for input(s): CHOL, HDL, LDLCALC, TRIG, CHOLHDL, LDLDIRECT in the last 72 hours. Thyroid Function Tests: Recent Labs    06/13/21 0830  TSH 3.850   Anemia Panel: No results for input(s): VITAMINB12, FOLATE, FERRITIN, TIBC, IRON, RETICCTPCT in the last 72 hours. Sepsis Labs: No results for input(s): PROCALCITON, LATICACIDVEN in the last 168 hours.  Recent Results (from the past 240 hour(s))  Resp Panel by RT-PCR (Flu A&B, Covid) Nasopharyngeal Swab     Status: None   Collection Time: 06/13/21  3:50 AM   Specimen: Nasopharyngeal Swab; Nasopharyngeal(NP) swabs in vial transport medium  Result Value Ref Range Status   SARS Coronavirus 2 by RT PCR NEGATIVE NEGATIVE Final    Comment: (NOTE) SARS-CoV-2 target nucleic acids are NOT DETECTED.  The SARS-CoV-2 RNA is generally detectable in upper respiratory specimens during the acute phase of infection. The lowest concentration of SARS-CoV-2 viral copies this assay can detect is 138 copies/mL. A negative result does not preclude SARS-Cov-2 infection and should not be used as the sole basis for treatment or other patient management decisions. A negative result may occur with  improper specimen collection/handling, submission of specimen other than nasopharyngeal  swab, presence of viral mutation(s) within the areas targeted by this assay, and inadequate number of viral copies(<138 copies/mL). A negative result must be combined with clinical observations, patient history, and epidemiological information. The expected result is Negative.  Fact Sheet for Patients:  EntrepreneurPulse.com.au  Fact Sheet for Healthcare Providers:  IncredibleEmployment.be  This test is no t yet approved or cleared by the [place] FDA and  has been authorized for detection and/or diagnosis of SARS-CoV-2 by FDA under an Emergency Use Authorization (EUA). This EUA will remain  in effect (meaning this test can be used) for the duration of the COVID-19 declaration under Section 564(b)(1) of the Act, 21 U.S.C.section 360bbb-3(b)(1), unless the authorization is terminated  or revoked sooner.       Influenza A by PCR NEGATIVE NEGATIVE Final   Influenza B by PCR NEGATIVE NEGATIVE Final    Comment: (NOTE) The Xpert Xpress SARS-CoV-2/FLU/RSV plus assay is intended as an aid in the diagnosis of influenza from Nasopharyngeal swab specimens and should not be used as a sole basis for treatment. Nasal washings and aspirates are unacceptable for Xpert Xpress  SARS-CoV-2/FLU/RSV testing.  Fact Sheet for Patients: EntrepreneurPulse.com.au  Fact Sheet for Healthcare Providers: IncredibleEmployment.be  This test is not yet approved or cleared by the [place] FDA and has been authorized for detection and/or diagnosis of SARS-CoV-2 by FDA under an Emergency Use Authorization (EUA). This EUA will remain in effect (meaning this test can be used) for the duration of the COVID-19 declaration under Section 564(b)(1) of the Act, 21 U.S.C. section 360bbb-3(b)(1), unless the authorization is terminated or revoked.  Performed at [facility], [place] [address] [place], [place] [address]           Radiology Studies: CT Head Wo Contrast  Result Date: 06/13/2021 CLINICAL DATA:  Mental status change, unknown cause EXAM: CT HEAD WITHOUT CONTRAST TECHNIQUE: Contiguous axial images were obtained from the base of the skull through the vertex without intravenous contrast. COMPARISON:  05/25/2021 FINDINGS: Brain: No evidence of acute infarction, hemorrhage, cerebral edema, mass, mass effect, or midline shift. No hydrocephalus or extra-axial fluid collection. Periventricular white matter changes, likely the sequela of chronic small vessel ischemic disease. Vascular: No hyperdense vessel. Skull: Normal. Negative for fracture or focal lesion. Sinuses/Orbits: No acute finding. Other: The mastoid air cells are well aerated. IMPRESSION: No acute intracranial process. Electronically Signed   By: Merilyn Baba M.D.   On: 06/13/2021 03:21   DG Chest Port 1 View  Result Date: 06/13/2021 CLINICAL DATA:  Increased weakness and body pain. EXAM: PORTABLE CHEST 1 VIEW COMPARISON:  May 25, 2021 FINDINGS: There is stable left-sided venous Port-A-Cath positioning. The heart size and mediastinal contours are within normal limits. Low lung volumes are seen. Both lungs are clear. The visualized skeletal structures are unremarkable. IMPRESSION: No active disease. Electronically Signed   By: Virgina Norfolk M.D.   On: 06/13/2021 00:43        Scheduled Meds:  apixaban  2.5 mg Oral BID   carvedilol  3.125 mg Oral BID WC   Chlorhexidine Gluconate Cloth  6 each Topical Daily   furosemide  20 mg Oral Daily   lactulose  20 g Oral TID   megestrol  600 mg Oral Daily   mometasone-formoterol  2 puff Inhalation BID   potassium chloride SA  20 mEq Oral Daily   sodium chloride flush  10-40 mL Intracatheter Q12H   umeclidinium bromide  1 puff Inhalation Daily   Continuous Infusions:  cefTRIAXone (ROCEPHIN)  IV 1 g (06/13/21 1216)   dextrose 5 % and 0.45% NaCl 75 mL/hr at 06/14/21 0352     LOS: 1 day    Time spent= 35 mins    Dominic Rhome Arsenio Loader, MD [facility]  If 7PM-7AM, please contact night-coverage  06/14/2021, 11:00 AM

## 2021-06-15 ENCOUNTER — Inpatient Hospital Stay (HOSPITAL_COMMUNITY)
Admit: 2021-06-15 | Discharge: 2021-06-15 | Disposition: A | Discharge status: Home / Self Care | Payer: Medicare Other | Attending: Internal Medicine | Admitting: Internal Medicine

## 2021-06-15 ENCOUNTER — Ambulatory Visit (HOSPITAL_COMMUNITY)
Admission: RE | Admit: 2021-06-15 | Discharge: 2021-06-15 | Disposition: A | Discharge status: Home / Self Care | Payer: Medicare Other | Source: Ambulatory Visit | Attending: Hematology | Admitting: Hematology

## 2021-06-15 ENCOUNTER — Inpatient Hospital Stay (HOSPITAL_COMMUNITY): Payer: Medicare Other

## 2021-06-15 DIAGNOSIS — R4182 Altered mental status, unspecified: Secondary | ICD-10-CM

## 2021-06-15 DIAGNOSIS — G9340 Encephalopathy, unspecified: Secondary | ICD-10-CM

## 2021-06-15 LAB — COMPREHENSIVE METABOLIC PANEL
ALT: 76 U/L — ABNORMAL HIGH (ref 0–44)
AST: 170 U/L — ABNORMAL HIGH (ref 15–41)
Albumin: 2.2 g/dL — ABNORMAL LOW (ref 3.5–5.0)
Alkaline Phosphatase: 195 U/L — ABNORMAL HIGH (ref 38–126)
Anion gap: 8 (ref 5–15)
BUN: 10 mg/dL (ref 8–23)
CO2: 22 mmol/L (ref 22–32)
Calcium: 8.3 mg/dL — ABNORMAL LOW (ref 8.9–10.3)
Chloride: 102 mmol/L (ref 98–111)
Creatinine, Ser: 0.74 mg/dL (ref 0.44–1.00)
GFR, Estimated: >60 mL/min (ref >60)
Glucose, Bld: 118 mg/dL — ABNORMAL HIGH (ref 70–99)
Potassium: 3.4 mmol/L — ABNORMAL LOW (ref 3.5–5.1)
Sodium: 132 mmol/L — ABNORMAL LOW (ref 135–145)
Total Bilirubin: 5.1 mg/dL — ABNORMAL HIGH (ref 0.3–1.2)
Total Protein: 5.4 g/dL — ABNORMAL LOW (ref 6.5–8.1)

## 2021-06-15 LAB — CBC
HCT: 22.1 % — ABNORMAL LOW (ref 36.0–46.0)
Hemoglobin: 7.7 g/dL — ABNORMAL LOW (ref 12.0–15.0)
MCH: 27.4 pg (ref 26.0–34.0)
MCHC: 34.8 g/dL (ref 30.0–36.0)
MCV: 78.6 fL — ABNORMAL LOW (ref 80.0–100.0)
Platelets: 195 10*3/uL (ref 150–400)
RBC: 2.81 MIL/uL — ABNORMAL LOW (ref 3.87–5.11)
RDW: 18.5 % — ABNORMAL HIGH (ref 11.5–15.5)
WBC: 7.2 10*3/uL (ref 4.0–10.5)
nRBC: 0 % (ref 0.0–0.2)

## 2021-06-15 LAB — MAGNESIUM: Magnesium: 2 mg/dL (ref 1.7–2.4)

## 2021-06-15 LAB — VITAMIN B12: Vitamin B-12: 1455 pg/mL — ABNORMAL HIGH (ref 180–914)

## 2021-06-15 LAB — AMMONIA: Ammonia: 39 umol/L — ABNORMAL HIGH (ref 9–35)

## 2021-06-15 MED ORDER — HALOPERIDOL LACTATE 5 MG/ML IJ SOLN
2.0000 mg | Freq: Once | INTRAMUSCULAR | Status: AC
  Administered 2021-06-15: 12:00:00 2 mg via INTRAVENOUS

## 2021-06-15 MED ORDER — MORPHINE SULFATE (PF) 2 MG/ML IV SOLN
1.0000 mg | Freq: Once | INTRAVENOUS | Status: AC
  Administered 2021-06-15: 21:00:00 1 mg via INTRAVENOUS
  Filled 2021-06-15: qty 1

## 2021-06-15 MED ORDER — POTASSIUM CHLORIDE 10 MEQ/100ML IV SOLN
10.0000 meq | INTRAVENOUS | Status: AC
  Administered 2021-06-15 (×2): 10 meq via INTRAVENOUS
  Filled 2021-06-15 (×2): qty 100

## 2021-06-15 MED ORDER — THIAMINE HCL 100 MG/ML IJ SOLN
500.0000 mg | Freq: Every day | INTRAVENOUS | Status: AC
  Administered 2021-06-15 – 2021-06-19 (×5): 500 mg via INTRAVENOUS
  Filled 2021-06-15 (×5): qty 5

## 2021-06-15 NOTE — Progress Notes (Signed)
HEMATOLOGY-ONCOLOGY PROGRESS NOTE  SUBJECTIVE: Teresa Hays is well-known to me, under my care for her metastatic breast cancer.  She was recently admitted for metabolic encephalopathy, resolved and discharged home on 05/31/2021. She was last seen by me in the office on June 05, 2021, and plan to start Kadcyla tomorrow. She presented to ED on 06/13/21 due to altered mental status.  No fever or chills.  Urine culture and respiratory panel has been negative.  She was still confused and somewhat restless when I saw her, she was able to recognize me, but did not answer other questions.  Her sister was at bedside.   PHYSICAL EXAMINATION: ECOG PERFORMANCE STATUS: 2 - Symptomatic, <50% confined to bed  Vitals:   06/15/21 0100 06/15/21 0600  BP: 115/82 101/76  Pulse:    Resp: 12 14  Temp: 98.3 F (36.8 C)   SpO2:     Filed Weights   06/13/21 1440  Weight: 158 lb 8.2 oz (71.9 kg)    Intake/Output from previous day: 11/27 0701 - 11/28 0700 In: 360 [P.O.:360] Out: 100 [Urine:100]  GENERAL: Awake confused  SKIN: skin color, texture, turgor are normal, no rashes or significant lesions EYES: normal, Conjunctiva are pink and non-injected, sclera clear LUNGS: clear to auscultation and percussion with normal breathing effort HEART: regular rate & rhythm and no murmurs and no lower extremity edema ABDOMEN:abdomen soft, non-tender and normal bowel sounds  NEURO: disoriented, but moves all extremities spontaneously  LABORATORY DATA:  I have reviewed the data as listed CMP Latest Ref Rng & Units 06/15/2021 06/14/2021 06/13/2021  Glucose 70 - 99 mg/dL 118(H) 130(H) 91  BUN 8 - 23 mg/dL _0 Creatinine 0.44 - 1.00 mg/dL 0.74 0.82 1.18(H)  Sodium 135 - 145 mmol/L 132(L) 130(L) 128(L)  Potassium 3.5 - 5.1 mmol/L 3.4(L) 3.3(L) 4.7  Chloride 98 - 111 mmol/L 102 100 96(L)  CO2 22 - 32 mmol/L 22 21(L) 22  Calcium 8.9 - 10.3 mg/dL 8.3(L) 8.4(L) 9.4  Total Protein 6.5 - 8.1 g/dL 5.4(L) 6.0(L) 7.3   Total Bilirubin 0.3 - 1.2 mg/dL 5.1(H) 5.3(H) 6.0(H)  Alkaline Phos 38 - 126 U/L 195(H) 243(H) 357(H)  AST 15 - 41 U/L 170(H) 227(H) 299(H)  ALT 0 - 44 U/L 76(H) 87(H) 113(H)    Lab Results  Component Value Date   WBC 7.2 06/15/2021   HGB 7.7 (L) 06/15/2021   HCT 22.1 (L) 06/15/2021   MCV 78.6 (L) 06/15/2021   PLT 195 06/15/2021   NEUTROABS 6.9 06/13/2021    CT Head Wo Contrast  Result Date: 06/13/2021 CLINICAL DATA:  Mental status change, unknown cause EXAM: CT HEAD WITHOUT CONTRAST TECHNIQUE: Contiguous axial images were obtained from the base of the skull through the vertex without intravenous contrast. COMPARISON:  05/25/2021 FINDINGS: Brain: No evidence of acute infarction, hemorrhage, cerebral edema, mass, mass effect, or midline shift. No hydrocephalus or extra-axial fluid collection. Periventricular white matter changes, likely the sequela of chronic small vessel ischemic disease. Vascular: No hyperdense vessel. Skull: Normal. Negative for fracture or focal lesion. Sinuses/Orbits: No acute finding. Other: The mastoid air cells are well aerated. IMPRESSION: No acute intracranial process. Electronically Signed   By: Merilyn Baba M.D.   On: 06/13/2021 03:21   CT Head Wo Contrast  Result Date: 05/25/2021 CLINICAL DATA:  Altered mental status. Undergoing chemotherapy for stage IV breast cancer. EXAM: CT HEAD WITHOUT CONTRAST TECHNIQUE: Contiguous axial images were obtained from the base of the skull through the vertex without intravenous  contrast. COMPARISON:  Brain MR dated 01/06/2021.  Head CT dated 10/25/2005. FINDINGS: Brain: Normal appearing cerebral hemispheres and posterior fossa structures. Normal size and position of the ventricles. No intracranial hemorrhage, mass lesion or CT evidence of acute infarction. Vascular: No hyperdense vessel or unexpected calcification. Skull: Normal. Negative for fracture or focal lesion. Sinuses/Orbits: No acute finding. Other: None. IMPRESSION:  No acute abnormality. Electronically Signed   By: Claudie Revering M.D.   On: 05/25/2021 16:05   MR BRAIN W WO CONTRAST  Result Date: 05/26/2021 CLINICAL DATA:  Mental status change, persistent or worsening. Additional history provided: Metastatic breast cancer, recent mental status change, evaluate for possible stroke versus metastases. EXAM: MRI HEAD WITHOUT AND WITH CONTRAST TECHNIQUE: Multiplanar, multiecho pulse sequences of the brain and surrounding structures were obtained without and with intravenous contrast. CONTRAST:  51m GADAVIST GADOBUTROL 1 MMOL/ML IV SOLN COMPARISON:  Prior head CT examinations 05/25/2021 and earlier. Brain MRI 01/06/2021. FINDINGS: Brain: Mild intermittent motion degradation. Cerebral volume is within normal limits for age. Mild multifocal T2 FLAIR hyperintense signal abnormality within the cerebral white matter, nonspecific but compatible with chronic small vessel ischemic disease. Redemonstrated tiny chronic lacunar infarct within the right cerebellar hemisphere (series 8, image 7). There is no acute infarct. No evidence of an intracranial mass. No chronic intracranial blood products. No extra-axial fluid collection. No midline shift. Incidentally noted cavum septum pellucidum and cavum vergae. No pathologic intracranial enhancement is identified to suggest intracranial metastatic disease. Vascular: Maintained flow voids within the proximal large arterial vessels. Skull and upper cervical spine: No focal suspicious marrow lesion. Incompletely assessed cervical spondylosis. Sinuses/Orbits: Visualized orbits show no acute finding. Minimal mucosal thickening within the left ethmoid air cells. IMPRESSION: Mildly motion degraded exam. No evidence of acute intracranial abnormality. No evidence of intracranial metastatic disease. Mild chronic small vessel ischemic changes within the cerebral white matter, stable. Redemonstrated tiny chronic lacunar infarct within the right cerebellar  hemisphere. Mild left ethmoid sinus mucosal thickening. Electronically Signed   By: KKellie SimmeringD.O.   On: 05/26/2021 15:34   DG Chest Port 1 View  Result Date: 06/13/2021 CLINICAL DATA:  Increased weakness and body pain. EXAM: PORTABLE CHEST 1 VIEW COMPARISON:  May 25, 2021 FINDINGS: There is stable left-sided venous Port-A-Cath positioning. The heart size and mediastinal contours are within normal limits. Low lung volumes are seen. Both lungs are clear. The visualized skeletal structures are unremarkable. IMPRESSION: No active disease. Electronically Signed   By: TVirgina NorfolkM.D.   On: 06/13/2021 00:43   DG Chest Port 1 View  Result Date: 05/25/2021 CLINICAL DATA:  Weakness. Undergoing chemotherapy for stage IV breast cancer. EXAM: PORTABLE CHEST 1 VIEW COMPARISON:  01/29/2020. Chest, abdomen and pelvis CT dated 03/24/2021. FINDINGS: Normal sized heart. Clear lungs. Left jugular porta catheter tip in the inferior aspect of the superior vena cava near the superior cavoatrial junction. Mild scoliosis. IMPRESSION: No acute abnormality. Electronically Signed   By: SClaudie ReveringM.D.   On: 05/25/2021 15:41   EEG adult  Result Date: 06/15/2021 YLora Havens MD     06/15/2021  5:39 PM Patient Name: Teresa BUSBINMRN: [id number] Attending: PLora HavensReferring Physician/Provider: Dr AGerlean RenDate: 06/15/2021 Duration: 23.23 mins Patient history: 89yoF with ams. EEG to evaluate for seizure. Level of alertness:  lethargic AEDs during EEG study: None Technical aspects: This EEG study was done with scalp electrodes positioned according to the 10-20 International system of electrode placement. Electrical activity  was acquired at a sampling rate of _0  and reviewed with a high frequency filter of _1  and a low frequency filter of _2 . EEG data were recorded continuously and digitally stored. Description: EEG showed continuous generalized high amplitude 3 to 6 Hz theta-delta  slowing, at times with triphasic morphology. Hyperventilation and photic stimulation were not performed.   ABNORMALITY - Continuous slow, generalized IMPRESSION: This study is suggestive of moderate diffuse encephalopathy, nonspecific etiology but could be secondary to toxic-metabolic causes. No seizures or epileptiform discharges were seen throughout the recording. Priyanka Barbra Sarks    ASSESSMENT AND PLAN: 1.  Metabolic Encephalopathy, recurrent  2.  ER/PR positive, HER2 positive metastatic breast cancer 3.  Transaminitis/hyperbilirubinemia likely due to worsening liver metastases 4.  History of left upper extremity DVT 7.  Hypertension  Plan -pt presented with recurrent encephalopathy, which is likely metabolic.  I suspect this could be related to her worsening liver function, especially hyperbilirubinemia with tbil in 5-6 range (was 2-3 on last admission)  -I do not think she needs a repeated brain MRI, last one was negative 3 weeks ago  -Also leptomeningeal disease is on differential, the diagnosis will require LP and cytology.  Unfortunately she is not a candidate for any further cancer treatment if she has leptomeningeal disease, and systemic treatment is also unlikely due to her rapid worsening liver function. -I would recommend transition patient to hospice. This was discussed with her family on last admission and they were open to it. I will talk to her daughter and her niece tomorrow.  -I will continue f/u   [name]  06/15/2021

## 2021-06-15 NOTE — Progress Notes (Signed)
PMT no charge note  Patient noted to be off the floor at the time of my visit, no family in the room, note that she is undergoing MRI brain.  PMT will follow up on 06-16-21, continue current mode of care.  No additional PMT specific recommendations at this time. Will continue broad goals of care discussions with patient/her sisters and daughter based on MRI findings, oncology input and her overall hospital course.  No charge Teresa Chance MD [place].

## 2021-06-15 NOTE — Progress Notes (Signed)
   06/13/21 1400  Clinical Encounter Type  Visited With Patient and family together  Visit Type Initial  Referral From Physician  Consult/Referral To Chaplain  Spiritual Encounters  Spiritual Needs Prayer;Ritual;Emotional;Grief support  Stress Factors  Patient Stress Factors Loss;Major life changes  Family Stress Factors Loss;Loss of control;Major life changes  Advance Directives (For Healthcare)  Does Patient Have a Medical Advance Directive? Yes   Chaplain met with patient and sister at bedside this afternoon.  Sister was eager to have a conversation regarding her sister Shunda's prognosis.  Sister is in from [place] and is a Museum/gallery exhibitions officer.  She discussed her sisters life narrative and her own growing up together ina large, close family.  She shared of many stories of her sister;s strength both emotionally and spiritually.  She described her sister's faith and how their two parents grew older and passed peacefully. Patient was asleep and not able to communicate at this time, but she is confident that because of her sister's faith that she will be "good to going to the arms of her Reita Cliche."  Chaplain helped sister be encouraged for helping Jennefer with her own anticipatory grief - Shaquita stating "Im not afraid to die just that I know I will miss you all here"  Sister wept as she shared this insight.  Provided spiritual care and comfort.  Promised to return for another visit as needed.   Respectfully submitted,   Rev. Wenda Low, M divinity BCCC, BCPC,Hospice/Palliative Care

## 2021-06-15 NOTE — Progress Notes (Signed)
EEG complete - results pending 

## 2021-06-15 NOTE — Progress Notes (Addendum)
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  But over the last 48 hours prior to admission she had become increasingly lethargic therefore brought to the hospital.  She is also had some anasarca, mild transaminitis.  Ammonia level 49.  CT head was unremarkable.  Assessment & Plan:   Principal Problem:   Acute encephalopathy Active Problems:   Metastatic breast cancer (HCC)   Asthma   Encephalopathy acute  Acute metabolic encephalopathy, persistent -Etiology is exactly unclear, possible polypharmacy versus underlying urinary tract infection, elevated Ammonia.  Ammonia is mildly elevated, Continue  lactulose CT head is negative.  -Neurochecks.  Currently getting home gabapentin, Cymbalta, tramadol, lorazepam is on hold - TSH - normal. Bladder scan showed urinary retention s/p Straight Cath. Cont Bladder scans q8hrs for now.  - IV Haldol as needed.  B12, TSH normal.  Folate pending. Discussed with Neuro and Onco. Will order EEG and IV Thiamine  Urinary tract infection - Ucx-grew multiple species.  On empiric IV Rocephin.  Peripheral edema, essential hypertension Herceptin induced mild cardiomyopathy; 65%, G1DD - Likely from cardiomyopathy secondary to Herceptin.  Continue Lasix -Echo done 03/2021-EF 65%, grade 1 DD  Hyponatremia Hypokalemia - Suspect from hypovolemia. Prn repletion.   Transaminitis - Suspect from metastatic disease.  Continue to trend LFTs for now  Left upper extremity DVT - On Eliquis  Anemia of chronic disease - On chemotherapy.  Monitor hemoglobin.  No obvious evidence of blood loss  Metastatic breast cancer - Followed by Dr. Burr Medico from oncology  History of asthma - As needed bronchodilators.  Dulera twice  daily    DVT prophylaxis: Eliquis Code Status: DNR Family Communication: Family at bedside.   Patient needs to remain in the hospital today due to significant amount of agitation and confusion.  She was nowhere close to baseline.  Not safe for discharge.     Subjective: Sluggish, responds to her name but doesn't consistently follow all the commands.   Review of Systems Otherwise negative except as per HPI, including: I am not able to obtain any thorough history from the patient as she is very agitated.  Examination: Constitutional: Not in acute distress; responds to her name but doesn't follow all the commands.  Respiratory: Clear to auscultation bilaterally Cardiovascular: Normal sinus rhythm, no rubs Abdomen: Nontender nondistended good bowel sounds Musculoskeletal: No edema noted Skin: No rashes seen Neurologic: Grossly moving all the extremities. Difficult to assess neuro exam, doesn't follow all the commands.  Psychiatric: Alert to her name only.   Objective: Vitals:   06/14/21 2300 06/15/21 0000 06/15/21 0100 06/15/21 0600  BP:   115/82 101/76  Pulse:      Resp: 20 14 12 14   Temp:   98.3 F (36.8 C)   TempSrc:   Oral   SpO2:      Weight:      Height:        Intake/Output Summary (Last 24 hours) at 06/15/2021 1123 Last data filed at 06/15/2021 0600 Gross per 24 hour  Intake 120 ml  Output 100 ml  Net 20 ml   Filed Weights   06/13/21 1440  Weight: 71.9 kg     Data Reviewed:   CBC: Recent Labs  Lab 06/13/21 0022 06/14/21 0908 06/15/21 0452  WBC 10.0 10.9* 7.2  NEUTROABS 6.9  --   --   HGB 11.6* 9.4* 7.7*  HCT 33.9* 26.6* 22.1*  MCV 81.3 79.4* 78.6*  PLT 251 253 350   Basic Metabolic Panel: Recent Labs  Lab 06/13/21 0022 06/14/21 0908 06/15/21 0452  NA 128* 130* 132*  K 4.7 3.3* 3.4*  CL 96* 100 102  CO2 22 21* 22  GLUCOSE 91 130* 118*  BUN 14 12 10   CREATININE 1.18* 0.82 0.74  CALCIUM 9.4 8.4* 8.3*  MG  --  2.0 2.0    GFR: Estimated Creatinine Clearance: 69.1 mL/min (by C-G formula based on SCr of 0.74 mg/dL). Liver Function Tests: Recent Labs  Lab 06/13/21 0022 06/14/21 0908 06/15/21 0452  AST 299* 227* 170*  ALT 113* 87* 76*  ALKPHOS 357* 243* 195*  BILITOT 6.0* 5.3* 5.1*  PROT 7.3 6.0* 5.4*  ALBUMIN 3.1* 2.5* 2.2*   No results for input(s): LIPASE, AMYLASE in the last 168 hours. Recent Labs  Lab 06/13/21 0022  AMMONIA 49*   Coagulation Profile: No results for input(s): INR, PROTIME in the last 168 hours. Cardiac Enzymes: No results for input(s): CKTOTAL, CKMB, CKMBINDEX, TROPONINI in the last 168 hours. BNP (last 3 results) No results for input(s): PROBNP in the last 8760 hours. HbA1C: No results for input(s): HGBA1C in the last 72 hours. CBG: No results for input(s): GLUCAP in the last 168 hours. Lipid Profile: No results for input(s): CHOL, HDL, LDLCALC, TRIG, CHOLHDL, LDLDIRECT in the last 72 hours. Thyroid Function Tests: Recent Labs    06/13/21 0830  TSH 3.850   Anemia Panel: Recent Labs    06/15/21 1021  VITAMINB12 1,455*   Sepsis Labs: Recent Labs  Lab 06/14/21 0908  LATICACIDVEN 2.2*    Recent Results (from the past 240 hour(s))  Resp Panel by RT-PCR (Flu A&B, Covid) Nasopharyngeal Swab     Status: None   Collection Time: 06/13/21  3:50 AM   Specimen: Nasopharyngeal Swab; Nasopharyngeal(NP) swabs in vial transport medium  Result Value Ref Range Status   SARS Coronavirus 2 by RT PCR NEGATIVE NEGATIVE Final    Comment: (NOTE) SARS-CoV-2 target nucleic acids are NOT DETECTED.  The SARS-CoV-2 RNA is generally detectable in upper respiratory specimens during the acute phase of infection. The lowest concentration of SARS-CoV-2 viral copies this assay can detect is 138 copies/mL. A negative result does not preclude SARS-Cov-2 infection and should not be used as the sole basis for treatment or other patient management decisions. A negative result may occur  with  improper specimen collection/handling, submission of specimen other than nasopharyngeal swab, presence of viral mutation(s) within the areas targeted by this assay, and inadequate number of viral copies(<138 copies/mL). A negative result must be combined with clinical observations, patient history, and epidemiological information. The expected result is Negative.  Fact Sheet for Patients:  EntrepreneurPulse.com.au  Fact Sheet for Healthcare Providers:  IncredibleEmployment.be  This test is no t yet approved or cleared by the [place] FDA and  has been authorized for detection and/or diagnosis of SARS-CoV-2 by FDA under an Emergency Use Authorization (EUA). This EUA will remain  in effect (meaning this test can be used) for the duration of the COVID-19 declaration under Section 564(b)(1) of the Act, 21 U.S.C.section 360bbb-3(b)(1), unless the authorization is terminated  or revoked sooner.       Influenza A by PCR NEGATIVE NEGATIVE Final   Influenza B by PCR NEGATIVE NEGATIVE Final    Comment: (NOTE) The Xpert Xpress SARS-CoV-2/FLU/RSV plus assay  is intended as an aid in the diagnosis of influenza from Nasopharyngeal swab specimens and should not be used as a sole basis for treatment. Nasal washings and aspirates are unacceptable for Xpert Xpress SARS-CoV-2/FLU/RSV testing.  Fact Sheet for Patients: EntrepreneurPulse.com.au  Fact Sheet for Healthcare Providers: IncredibleEmployment.be  This test is not yet approved or cleared by the [place] FDA and has been authorized for detection and/or diagnosis of SARS-CoV-2 by FDA under an Emergency Use Authorization (EUA). This EUA will remain in effect (meaning this test can be used) for the duration of the COVID-19 declaration under Section 564(b)(1) of the Act, 21 U.S.C. section 360bbb-3(b)(1), unless the authorization is terminated  or revoked.  Performed at [facility], [place] [address], [place], [place] [address]   Urine Culture     Status: Abnormal   Collection Time: 06/13/21  7:51 AM   Specimen: Urine, Clean Catch  Result Value Ref Range Status   Specimen Description   Final    URINE, CLEAN CATCH Performed at [facility], [place] [address], [place], [place] [address]    Special Requests   Final    NONE Performed at [facility], [place] [address], [place], [place] [address]    Culture MULTIPLE SPECIES PRESENT, SUGGEST RECOLLECTION (A)  Final   Report Status 06/14/2021 FINAL  Final         Radiology Studies: No results found.      Scheduled Meds:  apixaban  2.5 mg Oral BID   carvedilol  3.125 mg Oral BID WC   Chlorhexidine Gluconate Cloth  6 each Topical Daily   furosemide  20 mg Oral Daily   haloperidol lactate  2 mg Intravenous Once   lactulose  20 g Oral TID   megestrol  600 mg Oral Daily   mometasone-formoterol  2 puff Inhalation BID   potassium chloride SA  20 mEq Oral Daily   umeclidinium bromide  1 puff Inhalation Daily   Continuous Infusions:  cefTRIAXone (ROCEPHIN)  IV 1 g (06/14/21 1419)   dextrose 5 % and 0.45% NaCl 75 mL/hr at 06/15/21 0313   potassium chloride 10 mEq (06/15/21 1045)     LOS: 2 days   Time spent= 35 mins    Lyrick Worland Arsenio Loader, MD [facility]  If 7PM-7AM, please contact night-coverage  06/15/2021, 11:23 AM

## 2021-06-15 NOTE — Progress Notes (Signed)
Pt very lethargic this am.  Pt was able to respond to some basic commands early this am.   Alert enough for one bite of apple sauce with crushed medications. Pt then fell asleep before additional intake could occur. Unable to administer rest of am medications at this time. Will attempt later this morning as able.   Attending in room to see patient. Updated on status.

## 2021-06-15 NOTE — Procedures (Signed)
Patient Name: Teresa Hays  MRN: [id number]  Epilepsy Attending: Lora Havens  Referring Physician/Provider: Dr Gerlean Ren Date: 06/15/2021 Duration: 23.23 mins  Patient history: 65yo F with ams. EEG to evaluate for seizure.   Level of alertness:  lethargic   AEDs during EEG study: None  Technical aspects: This EEG study was done with scalp electrodes positioned according to the 10-20 International system of electrode placement. Electrical activity was acquired at a sampling rate of 500Hz  and reviewed with a high frequency filter of 70Hz  and a low frequency filter of 1Hz . EEG data were recorded continuously and digitally stored.   Description: EEG showed continuous generalized high amplitude 3 to 6 Hz theta-delta slowing, at times with triphasic morphology. Hyperventilation and photic stimulation were not performed.     ABNORMALITY - Continuous slow, generalized  IMPRESSION: This study is suggestive of moderate diffuse encephalopathy, nonspecific etiology but could be secondary to toxic-metabolic causes. No seizures or epileptiform discharges were seen throughout the recording.  Teresa Hays Teresa Hays

## 2021-06-15 NOTE — Progress Notes (Signed)
[place] [address] [facility]) Hospital Liaison note:  This patient is currently enrolled in [facility] outpatient-based Palliative Care. Will continue to follow for disposition.  Please call with any outpatient palliative questions or concerns.  Thank you, Lorelee Market, LPN [facility] Liaison [phone]

## 2021-06-16 ENCOUNTER — Inpatient Hospital Stay: Payer: Medicare Other

## 2021-06-16 ENCOUNTER — Inpatient Hospital Stay: Payer: Medicare Other | Admitting: Nurse Practitioner

## 2021-06-16 ENCOUNTER — Encounter (HOSPITAL_COMMUNITY): Payer: Self-pay | Admitting: Internal Medicine

## 2021-06-16 DIAGNOSIS — G9340 Encephalopathy, unspecified: Secondary | ICD-10-CM | POA: Diagnosis not present

## 2021-06-16 LAB — FOLATE RBC
Folate, Hemolysate: 395 ng/mL
Folate, RBC: 1599 ng/mL (ref >498)
Hematocrit: 24.7 % — ABNORMAL LOW (ref 34.0–46.6)

## 2021-06-16 LAB — COMPREHENSIVE METABOLIC PANEL
ALT: 76 U/L — ABNORMAL HIGH (ref 0–44)
AST: 156 U/L — ABNORMAL HIGH (ref 15–41)
Albumin: 2.5 g/dL — ABNORMAL LOW (ref 3.5–5.0)
Alkaline Phosphatase: 242 U/L — ABNORMAL HIGH (ref 38–126)
Anion gap: 8 (ref 5–15)
BUN: 12 mg/dL (ref 8–23)
CO2: 21 mmol/L — ABNORMAL LOW (ref 22–32)
Calcium: 8.7 mg/dL — ABNORMAL LOW (ref 8.9–10.3)
Chloride: 101 mmol/L (ref 98–111)
Creatinine, Ser: 0.79 mg/dL (ref 0.44–1.00)
GFR, Estimated: >60 mL/min (ref >60)
Glucose, Bld: 103 mg/dL — ABNORMAL HIGH (ref 70–99)
Potassium: 3.9 mmol/L (ref 3.5–5.1)
Sodium: 130 mmol/L — ABNORMAL LOW (ref 135–145)
Total Bilirubin: 5.9 mg/dL — ABNORMAL HIGH (ref 0.3–1.2)
Total Protein: 5.9 g/dL — ABNORMAL LOW (ref 6.5–8.1)

## 2021-06-16 LAB — CBC
HCT: 23.3 % — ABNORMAL LOW (ref 36.0–46.0)
Hemoglobin: 8.2 g/dL — ABNORMAL LOW (ref 12.0–15.0)
MCH: 27.4 pg (ref 26.0–34.0)
MCHC: 35.2 g/dL (ref 30.0–36.0)
MCV: 77.9 fL — ABNORMAL LOW (ref 80.0–100.0)
Platelets: 220 10*3/uL (ref 150–400)
RBC: 2.99 MIL/uL — ABNORMAL LOW (ref 3.87–5.11)
RDW: 18.6 % — ABNORMAL HIGH (ref 11.5–15.5)
WBC: 8.1 10*3/uL (ref 4.0–10.5)
nRBC: 0 % (ref 0.0–0.2)

## 2021-06-16 LAB — MAGNESIUM: Magnesium: 1.8 mg/dL (ref 1.7–2.4)

## 2021-06-16 MED ORDER — MORPHINE SULFATE (PF) 2 MG/ML IV SOLN
1.0000 mg | Freq: Once | INTRAVENOUS | Status: AC
  Administered 2021-06-16: 1 mg via INTRAVENOUS

## 2021-06-16 MED ORDER — MORPHINE SULFATE (PF) 2 MG/ML IV SOLN
INTRAVENOUS | Status: AC
  Filled 2021-06-16: qty 1

## 2021-06-16 NOTE — Progress Notes (Signed)
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  But over the last 48 hours prior to admission she had become increasingly lethargic therefore brought to the hospital.  She is also had some anasarca, mild transaminitis.  Ammonia level 49.  CT head was unremarkable.  She has been on IV Rocephin for mild urinary tract infection and lactulose for mildly elevated ammonia.  She continues to remain encephalopathic.  EEG has been negative.  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  Oncology team following.  Assessment & Plan:   Principal Problem:   Acute encephalopathy Active Problems:   Metastatic breast cancer (HCC)   Asthma   Encephalopathy acute  Acute metabolic encephalopathy, persistent -Etiology is exactly unclear, possible polypharmacy versus underlying urinary tract infection, elevated Ammonia.  Ammonia is mildly elevated, Continue  lactulose CT head is negative.  -Neurochecks.  Currently getting home gabapentin, Cymbalta, tramadol, lorazepam is on hold - TSH - normal. Bladder scan showed urinary retention s/p Straight Cath. Cont Bladder scans q8hrs for now.  - IV Haldol as needed.  B12, TSH normal.  EEG is negative.  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  Remains on empiric IV Rocephin  Left flank erythema - Trauma versus cellulitis.  Continue IV Rocephin and monitor that area.  That area has been demarcated by the overnight team.   Peripheral edema, essential hypertension Herceptin induced mild cardiomyopathy; 65%, G1DD - Likely from cardiomyopathy secondary  to Herceptin.  Continue Lasix -Echo done 03/2021-EF 65%, grade 1 DD  Hyponatremia Hypokalemia - Suspect from hypovolemia. Prn repletion.   Transaminitis - Suspect from metastatic disease.  Continue to trend LFTs for now  Left upper extremity DVT - On Eliquis  Anemia of chronic disease - On chemotherapy.  Monitor hemoglobin.  No obvious evidence of blood loss  Metastatic breast cancer - Seen by Dr. Burr Medico from oncology  History of asthma - As needed bronchodilators.  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  Per oncology even if this is leptomeningeal spread there is no treatment therefore recommending transitioning patient to hospice/comfort care.  Their service will meet with the patient's family later on today.   DVT prophylaxis: Eliquis Code Status: DNR Family Communication: Family at bedside.   Still quite confused, on going work up. Not ready for dc.    Subjective: Sluggish. Minimal response.   Review of Systems Otherwise negative except as per HPI, including: Unable to obtain.   Examination: Constitutional: Lethargic.  Minimal response to her name. Respiratory: Clear to auscultation bilaterally Cardiovascular: Normal sinus rhythm, no rubs Abdomen: Nontender nondistended good bowel sounds Musculoskeletal: No edema noted Skin: Slight left flank erythema Neurologic: CN 2-12 grossly intact.  And nonfocal Psychiatric: Unable to assess. Currently patient is in soft mittens as she is trying to pull on her IV lines  Objective: Vitals:   06/16/21 0200 06/16/21 0300 06/16/21 0400 06/16/21 0739  BP:   (!) 150/104 134/86  Pulse:   92 87  Resp: 14 13 17 16   Temp:      TempSrc:      SpO2:  96%   Weight:      Height:        Intake/Output Summary (Last 24 hours) at 06/16/2021 1313 Last data filed at 06/15/2021 1800 Gross per 24 hour  Intake 247.25 ml  Output --  Net 247.25 ml   Filed Weights    06/13/21 1440  Weight: 71.9 kg     Data Reviewed:   CBC: Recent Labs  Lab 06/13/21 0022 06/14/21 0908 06/15/21 0452 06/16/21 0443  WBC 10.0 10.9* 7.2 8.1  NEUTROABS 6.9  --   --   --   HGB 11.6* 9.4* 7.7* 8.2*  HCT 33.9* 26.6* 22.1* 23.3*  MCV 81.3 79.4* 78.6* 77.9*  PLT 251 253 195 916   Basic Metabolic Panel: Recent Labs  Lab 06/13/21 0022 06/14/21 0908 06/15/21 0452 06/16/21 0443  NA 128* 130* 132* 130*  K 4.7 3.3* 3.4* 3.9  CL 96* 100 102 101  CO2 22 21* 22 21*  GLUCOSE 91 130* 118* 103*  BUN 14 12 10 12   CREATININE 1.18* 0.82 0.74 0.79  CALCIUM 9.4 8.4* 8.3* 8.7*  MG  --  2.0 2.0 1.8   GFR: Estimated Creatinine Clearance: 69.1 mL/min (by C-G formula based on SCr of 0.79 mg/dL). Liver Function Tests: Recent Labs  Lab 06/13/21 0022 06/14/21 0908 06/15/21 0452 06/16/21 0443  AST 299* 227* 170* 156*  ALT 113* 87* 76* 76*  ALKPHOS 357* 243* 195* 242*  BILITOT 6.0* 5.3* 5.1* 5.9*  PROT 7.3 6.0* 5.4* 5.9*  ALBUMIN 3.1* 2.5* 2.2* 2.5*   No results for input(s): LIPASE, AMYLASE in the last 168 hours. Recent Labs  Lab 06/13/21 0022 06/15/21 1512  AMMONIA 49* 39*   Coagulation Profile: No results for input(s): INR, PROTIME in the last 168 hours. Cardiac Enzymes: No results for input(s): CKTOTAL, CKMB, CKMBINDEX, TROPONINI in the last 168 hours. BNP (last 3 results) No results for input(s): PROBNP in the last 8760 hours. HbA1C: No results for input(s): HGBA1C in the last 72 hours. CBG: No results for input(s): GLUCAP in the last 168 hours. Lipid Profile: No results for input(s): CHOL, HDL, LDLCALC, TRIG, CHOLHDL, LDLDIRECT in the last 72 hours. Thyroid Function Tests: No results for input(s): TSH, T4TOTAL, FREET4, T3FREE, THYROIDAB in the last 72 hours.  Anemia Panel: Recent Labs    06/15/21 1021  VITAMINB12 1,455*   Sepsis Labs: Recent Labs  Lab 06/14/21 0908  LATICACIDVEN 2.2*    Recent Results (from the past 240 hour(s))  Resp  Panel by RT-PCR (Flu A&B, Covid) Nasopharyngeal Swab     Status: None   Collection Time: 06/13/21  3:50 AM   Specimen: Nasopharyngeal Swab; Nasopharyngeal(NP) swabs in vial transport medium  Result Value Ref Range Status   SARS Coronavirus 2 by RT PCR NEGATIVE NEGATIVE Final    Comment: (NOTE) SARS-CoV-2 target nucleic acids are NOT DETECTED.  The SARS-CoV-2 RNA is generally detectable in upper respiratory specimens during the acute phase of infection. The lowest concentration of SARS-CoV-2 viral copies this assay can detect is 138 copies/mL. A negative result does not preclude SARS-Cov-2 infection and should not be used as the sole basis for treatment or other patient management decisions. A negative result may occur with  improper specimen collection/handling, submission of specimen other than nasopharyngeal swab, presence of viral mutation(s) within the areas targeted by this assay, and inadequate number of viral copies(<138 copies/mL). A negative result must be combined with clinical observations, patient history, and epidemiological information. The expected result is Negative.  Fact  Sheet for Patients:  EntrepreneurPulse.com.au  Fact Sheet for Healthcare Providers:  IncredibleEmployment.be  This test is no t yet approved or cleared by the [place] FDA and  has been authorized for detection and/or diagnosis of SARS-CoV-2 by FDA under an Emergency Use Authorization (EUA). This EUA will remain  in effect (meaning this test can be used) for the duration of the COVID-19 declaration under Section 564(b)(1) of the Act, 21 U.S.C.section 360bbb-3(b)(1), unless the authorization is terminated  or revoked sooner.       Influenza A by PCR NEGATIVE NEGATIVE Final   Influenza B by PCR NEGATIVE NEGATIVE Final    Comment: (NOTE) The Xpert Xpress SARS-CoV-2/FLU/RSV plus assay is intended as an aid in the diagnosis of influenza from Nasopharyngeal  swab specimens and should not be used as a sole basis for treatment. Nasal washings and aspirates are unacceptable for Xpert Xpress SARS-CoV-2/FLU/RSV testing.  Fact Sheet for Patients: EntrepreneurPulse.com.au  Fact Sheet for Healthcare Providers: IncredibleEmployment.be  This test is not yet approved or cleared by the [place] FDA and has been authorized for detection and/or diagnosis of SARS-CoV-2 by FDA under an Emergency Use Authorization (EUA). This EUA will remain in effect (meaning this test can be used) for the duration of the COVID-19 declaration under Section 564(b)(1) of the Act, 21 U.S.C. section 360bbb-3(b)(1), unless the authorization is terminated or revoked.  Performed at [facility], [place] [address], [place], [place] [address]   Urine Culture     Status: Abnormal   Collection Time: 06/13/21  7:51 AM   Specimen: Urine, Clean Catch  Result Value Ref Range Status   Specimen Description   Final    URINE, CLEAN CATCH Performed at [facility], [place] [address] [place], [place] [address]    Special Requests   Final    NONE Performed at [facility], [place] [address] [place], [place] [address]    Culture MULTIPLE SPECIES PRESENT, SUGGEST RECOLLECTION (A)  Final   Report Status 06/14/2021 FINAL  Final         Radiology Studies: EEG adult  Result Date: 06/21/2021 Lora Havens, MD     2021/06/21  5:39 PM Patient Name: Teresa Hays MRN: [id number] Epilepsy Attending: Lora Havens Referring Physician/Provider: Dr Gerlean Ren Date: 2021/06/21 Duration: 23.23 mins Patient history: 65yo F with ams. EEG to evaluate for seizure. Level of alertness:  lethargic AEDs during EEG study: None Technical aspects: This EEG study was done with scalp electrodes positioned according to the 10-20 International system of electrode placement. Electrical activity was acquired at  a sampling rate of 500Hz  and reviewed with a high frequency filter of 70Hz  and a low frequency filter of 1Hz . EEG data were recorded continuously and digitally stored. Description: EEG showed continuous generalized high amplitude 3 to 6 Hz theta-delta slowing, at times with triphasic morphology. Hyperventilation and photic stimulation were not performed.   ABNORMALITY - Continuous slow, generalized IMPRESSION: This study is suggestive of moderate diffuse encephalopathy, nonspecific etiology but could be secondary to toxic-metabolic causes. No seizures or epileptiform discharges were seen throughout the recording. Priyanka Barbra Sarks        Scheduled Meds:  apixaban  2.5 mg Oral BID   carvedilol  3.125 mg Oral BID WC   Chlorhexidine Gluconate Cloth  6 each Topical Daily   furosemide  20 mg Oral Daily   lactulose  20 g Oral TID   megestrol  600 mg Oral Daily  mometasone-formoterol  2 puff Inhalation BID   potassium chloride SA  20 mEq Oral Daily   umeclidinium bromide  1 puff Inhalation Daily   Continuous Infusions:  cefTRIAXone (ROCEPHIN)  IV 1 g (06/16/21 1132)   dextrose 5 % and 0.45% NaCl 75 mL/hr at 06/15/21 2113   thiamine injection 500 mg (06/16/21 1035)     LOS: 3 days   Time spent= 35 mins    Taqwa Deem Arsenio Loader, MD [facility]  If 7PM-7AM, please contact night-coverage  06/16/2021, 1:13 PM

## 2021-06-16 NOTE — Progress Notes (Addendum)
HEMATOLOGY-ONCOLOGY PROGRESS NOTE  SUBJECTIVE: Teresa Hays remains lethargic.  Her niece is at the bedside.  Family notes that are reddened/painful area on her right flank.  Afebrile.  PHYSICAL EXAMINATION: ECOG PERFORMANCE STATUS: 2 - Symptomatic, <50% confined to bed  Vitals:   06/16/21 0400 06/16/21 0739  BP: (!) 150/104 134/86  Pulse: 92 87  Resp: 17 16  Temp:    SpO2: 96%    Filed Weights   06/13/21 1440  Weight: 71.9 kg    Intake/Output from previous day: 11/28 0701 - 11/29 0700 In: 3560 [P.O.:90; I.V.:3050.9; IV Piggyback:419.1] Out: -   GENERAL: Lethargic SKIN: skin color, texture, turgor are normal, no rashes or significant lesions EYES: normal, Conjunctiva are pink and non-injected, sclera clear LUNGS: clear to auscultation and percussion with normal breathing effort HEART: regular rate & rhythm and no murmurs and no lower extremity edema ABDOMEN:abdomen soft, non-tender and normal bowel sounds  NEURO: disoriented, but moves all extremities spontaneously  LABORATORY DATA:  I have reviewed the data as listed CMP Latest Ref Rng & Units 06/16/2021 06/15/2021 06/14/2021  Glucose 70 - 99 mg/dL 103(H) 118(H) 130(H)  BUN 8 - 23 mg/dL _0 Creatinine 0.44 - 1.00 mg/dL 0.79 0.74 0.82  Sodium 135 - 145 mmol/L 130(L) 132(L) 130(L)  Potassium 3.5 - 5.1 mmol/L 3.9 3.4(L) 3.3(L)  Chloride 98 - 111 mmol/L 101 102 100  CO2 22 - 32 mmol/L 21(L) 22 21(L)  Calcium 8.9 - 10.3 mg/dL 8.7(L) 8.3(L) 8.4(L)  Total Protein 6.5 - 8.1 g/dL 5.9(L) 5.4(L) 6.0(L)  Total Bilirubin 0.3 - 1.2 mg/dL 5.9(H) 5.1(H) 5.3(H)  Alkaline Phos 38 - 126 U/L 242(H) 195(H) 243(H)  AST 15 - 41 U/L 156(H) 170(H) 227(H)  ALT 0 - 44 U/L 76(H) 76(H) 87(H)    Lab Results  Component Value Date   WBC 8.1 06/16/2021   HGB 8.2 (L) 06/16/2021   HCT 23.3 (L) 06/16/2021   MCV 77.9 (L) 06/16/2021   PLT 220 06/16/2021   NEUTROABS 6.9 06/13/2021    CT Head Wo Contrast  Result Date: 06/13/2021 CLINICAL  DATA:  Mental status change, unknown cause EXAM: CT HEAD WITHOUT CONTRAST TECHNIQUE: Contiguous axial images were obtained from the base of the skull through the vertex without intravenous contrast. COMPARISON:  05/25/2021 FINDINGS: Brain: No evidence of acute infarction, hemorrhage, cerebral edema, mass, mass effect, or midline shift. No hydrocephalus or extra-axial fluid collection. Periventricular white matter changes, likely the sequela of chronic small vessel ischemic disease. Vascular: No hyperdense vessel. Skull: Normal. Negative for fracture or focal lesion. Sinuses/Orbits: No acute finding. Other: The mastoid air cells are well aerated. IMPRESSION: No acute intracranial process. Electronically Signed   By: Merilyn Baba M.D.   On: 06/13/2021 03:21   CT Head Wo Contrast  Result Date: 05/25/2021 CLINICAL DATA:  Altered mental status. Undergoing chemotherapy for stage IV breast cancer. EXAM: CT HEAD WITHOUT CONTRAST TECHNIQUE: Contiguous axial images were obtained from the base of the skull through the vertex without intravenous contrast. COMPARISON:  Brain MR dated 01/06/2021.  Head CT dated 10/25/2005. FINDINGS: Brain: Normal appearing cerebral hemispheres and posterior fossa structures. Normal size and position of the ventricles. No intracranial hemorrhage, mass lesion or CT evidence of acute infarction. Vascular: No hyperdense vessel or unexpected calcification. Skull: Normal. Negative for fracture or focal lesion. Sinuses/Orbits: No acute finding. Other: None. IMPRESSION: No acute abnormality. Electronically Signed   By: Claudie Revering M.D.   On: 05/25/2021 16:05   MR BRAIN W  WO CONTRAST  Result Date: 05/26/2021 CLINICAL DATA:  Mental status change, persistent or worsening. Additional history provided: Metastatic breast cancer, recent mental status change, evaluate for possible stroke versus metastases. EXAM: MRI HEAD WITHOUT AND WITH CONTRAST TECHNIQUE: Multiplanar, multiecho pulse sequences of the  brain and surrounding structures were obtained without and with intravenous contrast. CONTRAST:  76m GADAVIST GADOBUTROL 1 MMOL/ML IV SOLN COMPARISON:  Prior head CT examinations 05/25/2021 and earlier. Brain MRI 01/06/2021. FINDINGS: Brain: Mild intermittent motion degradation. Cerebral volume is within normal limits for age. Mild multifocal T2 FLAIR hyperintense signal abnormality within the cerebral white matter, nonspecific but compatible with chronic small vessel ischemic disease. Redemonstrated tiny chronic lacunar infarct within the right cerebellar hemisphere (series 8, image 7). There is no acute infarct. No evidence of an intracranial mass. No chronic intracranial blood products. No extra-axial fluid collection. No midline shift. Incidentally noted cavum septum pellucidum and cavum vergae. No pathologic intracranial enhancement is identified to suggest intracranial metastatic disease. Vascular: Maintained flow voids within the proximal large arterial vessels. Skull and upper cervical spine: No focal suspicious marrow lesion. Incompletely assessed cervical spondylosis. Sinuses/Orbits: Visualized orbits show no acute finding. Minimal mucosal thickening within the left ethmoid air cells. IMPRESSION: Mildly motion degraded exam. No evidence of acute intracranial abnormality. No evidence of intracranial metastatic disease. Mild chronic small vessel ischemic changes within the cerebral white matter, stable. Redemonstrated tiny chronic lacunar infarct within the right cerebellar hemisphere. Mild left ethmoid sinus mucosal thickening. Electronically Signed   By: KKellie SimmeringD.O.   On: 05/26/2021 15:34   DG Chest Port 1 View  Result Date: 06/13/2021 CLINICAL DATA:  Increased weakness and body pain. EXAM: PORTABLE CHEST 1 VIEW COMPARISON:  May 25, 2021 FINDINGS: There is stable left-sided venous Port-A-Cath positioning. The heart size and mediastinal contours are within normal limits. Low lung volumes are  seen. Both lungs are clear. The visualized skeletal structures are unremarkable. IMPRESSION: No active disease. Electronically Signed   By: TVirgina NorfolkM.D.   On: 06/13/2021 00:43   DG Chest Port 1 View  Result Date: 05/25/2021 CLINICAL DATA:  Weakness. Undergoing chemotherapy for stage IV breast cancer. EXAM: PORTABLE CHEST 1 VIEW COMPARISON:  01/29/2020. Chest, abdomen and pelvis CT dated 03/24/2021. FINDINGS: Normal sized heart. Clear lungs. Left jugular porta catheter tip in the inferior aspect of the superior vena cava near the superior cavoatrial junction. Mild scoliosis. IMPRESSION: No acute abnormality. Electronically Signed   By: SClaudie ReveringM.D.   On: 05/25/2021 15:41   EEG adult  Result Date: 06/15/2021 YLora Havens MD     06/15/2021  5:39 PM Patient Name: Teresa NANDMRN: [id number] Attending: PLora HavensReferring Physician/Provider: Dr AGerlean RenDate: 06/15/2021 Duration: 23.23 mins Patient history: 63yoF with ams. EEG to evaluate for seizure. Level of alertness:  lethargic AEDs during EEG study: None Technical aspects: This EEG study was done with scalp electrodes positioned according to the 10-20 International system of electrode placement. Electrical activity was acquired at a sampling rate of _0  and reviewed with a high frequency filter of _1  and a low frequency filter of _2 . EEG data were recorded continuously and digitally stored. Description: EEG showed continuous generalized high amplitude 3 to 6 Hz theta-delta slowing, at times with triphasic morphology. Hyperventilation and photic stimulation were not performed.   ABNORMALITY - Continuous slow, generalized IMPRESSION: This study is suggestive of moderate diffuse encephalopathy, nonspecific etiology but could be secondary to toxic-metabolic causes. No seizures  or epileptiform discharges were seen throughout the recording. Priyanka Barbra Sarks    ASSESSMENT AND PLAN: 1.  Metabolic  Encephalopathy, recurrent  2.  ER/PR positive, HER2 positive metastatic breast cancer 3.  Transaminitis/hyperbilirubinemia likely due to worsening liver metastases 4.  History of left upper extremity DVT 7.  Hypertension  Plan -pt presented with recurrent encephalopathy, which is likely metabolic.  I suspect this could be related to her worsening liver function, especially hyperbilirubinemia with tbil in 5-6 range (was 2-3 on last admission)  -Leptomeningeal disease is on the differential but this will require an LP and cytology. Unfortunately she is not a candidate for any further cancer treatment if she has leptomeningeal disease, and systemic treatment is also unlikely due to her rapid worsening liver function.  Given that she is not a candidate for systemic therapy, would not recommend proceeding with LP at this time. -I would recommend transition patient to hospice. This was discussed with her family on last admission and they were open to it.  I initiated this discussion today with the patient's niece.  Dr. Burr Medico will follow-up later today to have further discussion with the family.  Mikey Bussing  06/16/2021   Addendum  Pt appears to be more confused and restless today, her sister and niece were at the bedside.  I again reviewed her recent lab results, and discussed the overall very poor prognosis. I recommend hospice and comfort care, patient is not eating much, I think her life expectancy is less than 2 weeks, and she qualifies for residential hospice.  Her niece Donneta Romberg who is a NP agrees with comfort care, and will discuss with pt's daughter tonight. I will f/u with them tomorrow. I will reach out to palliative care team also.  [name]  06/16/2021

## 2021-06-16 NOTE — Progress Notes (Addendum)
~   2035: Pt has an inflammed area on the left side, painful, warm to touch, swollen and red with what looks like blisters   Family at bedside and on call APP Olena Heckle Np notified. Family request NP at bedside.   ON call APP Olena Heckle at bedside 0015, spoke with pt niece Ulice Dash via phone, assessed inflamed area. Area is outlined and size of area has not changed overnight

## 2021-06-17 DIAGNOSIS — E878 Other disorders of electrolyte and fluid balance, not elsewhere classified: Secondary | ICD-10-CM

## 2021-06-17 DIAGNOSIS — D649 Anemia, unspecified: Secondary | ICD-10-CM

## 2021-06-17 DIAGNOSIS — I10 Essential (primary) hypertension: Secondary | ICD-10-CM

## 2021-06-17 DIAGNOSIS — L539 Erythematous condition, unspecified: Secondary | ICD-10-CM

## 2021-06-17 DIAGNOSIS — I82622 Acute embolism and thrombosis of deep veins of left upper extremity: Secondary | ICD-10-CM

## 2021-06-17 DIAGNOSIS — R7989 Other specified abnormal findings of blood chemistry: Secondary | ICD-10-CM

## 2021-06-17 DIAGNOSIS — G9340 Encephalopathy, unspecified: Secondary | ICD-10-CM | POA: Diagnosis not present

## 2021-06-17 DIAGNOSIS — I5189 Other ill-defined heart diseases: Secondary | ICD-10-CM

## 2021-06-17 DIAGNOSIS — N390 Urinary tract infection, site not specified: Secondary | ICD-10-CM

## 2021-06-17 LAB — CBC
HCT: 23 % — ABNORMAL LOW (ref 36.0–46.0)
Hemoglobin: 8.1 g/dL — ABNORMAL LOW (ref 12.0–15.0)
MCH: 26.8 pg (ref 26.0–34.0)
MCHC: 35.2 g/dL (ref 30.0–36.0)
MCV: 76.2 fL — ABNORMAL LOW (ref 80.0–100.0)
Platelets: 256 10*3/uL (ref 150–400)
RBC: 3.02 MIL/uL — ABNORMAL LOW (ref 3.87–5.11)
RDW: 18.4 % — ABNORMAL HIGH (ref 11.5–15.5)
WBC: 7.8 10*3/uL (ref 4.0–10.5)
nRBC: 0.4 % — ABNORMAL HIGH (ref 0.0–0.2)

## 2021-06-17 LAB — MAGNESIUM: Magnesium: 2 mg/dL (ref 1.7–2.4)

## 2021-06-17 MED ORDER — ALBUTEROL SULFATE (2.5 MG/3ML) 0.083% IN NEBU: 2.5000 mg | INHALATION_SOLUTION | Freq: Four times a day (QID) | RESPIRATORY_TRACT | Status: DC | PRN

## 2021-06-17 MED ORDER — MORPHINE SULFATE (PF) 2 MG/ML IV SOLN
1.0000 mg | Freq: Once | INTRAVENOUS | Status: AC
  Administered 2021-06-17: 1 mg via INTRAVENOUS
  Filled 2021-06-17: qty 1

## 2021-06-17 MED ORDER — ALBUTEROL SULFATE (2.5 MG/3ML) 0.083% IN NEBU
2.5000 mg | INHALATION_SOLUTION | Freq: Two times a day (BID) | RESPIRATORY_TRACT | Status: DC
  Administered 2021-06-18 – 2021-06-19 (×3): 2.5 mg via RESPIRATORY_TRACT
  Filled 2021-06-17 (×4): qty 3

## 2021-06-17 NOTE — Progress Notes (Signed)
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  She's represented with increasing lethargy.  Continues with persistent encephalopathy.  Oncology recommending hospice.   Assessment & Plan:   Principal Problem:   Acute encephalopathy Active Problems:   Asthma   Goals of care, counseling/discussion   Encephalopathy acute   Metastatic breast cancer (HCC)   Acute lower UTI   Erythema   LFTs abnormal   Acute deep vein thrombosis (DVT) of left upper extremity (HCC)   Essential hypertension   Diastolic dysfunction   Electrolyte abnormality   Anemia  * Acute encephalopathy Unclear etiology - polypharmacy vs UTI vs ammonia vs related to underlying malignancy Ammonia 39, TSH wnl, B12 elevated, folate WNL Urine cx with multiple species Head CT without acute abnormality 11/8 MRI brain without acute abnormality EEG with mod diffuse encephalopathy Hold gabapentin, cymbalta, tramadol, ativan Thiamine 500 mg daily Lactulose Appreciate oncology recommendations - leptomeningeal disease on ddx (oncology notes she's not candidate for further cancer treatment if leptomeningeal disease and systemic treatment also unlikely with worsening LFT's - they did not recommend proceding with LP) - recommending hospice    Goals of care, counseling/discussion Case discussed with family by Dr. Burr Hays, recommending residential hospice given poor PO intake and liver failure, suspected life expectancy <2 weeks.  (see 11/30 note from Dr. Burr Hays)  Metastatic breast cancer [facility]) Dr. Burr Hays recommending hospice in setting of encephalopathy, and liver failure  Acute lower  UTI cx with multiple species On ceftriaxone  Erythema Trauma vs cellulitis? 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 No seizures or epileptiform discharges were seen throughout the recording. Antimicrobials:  Anti-infectives (From admission, onward)    Start     Dose/Rate Route Frequency Ordered Stop   06/13/21 1200  cefTRIAXone (ROCEPHIN) 1 g in sodium chloride 0.9 % 100 mL IVPB        1 g 200 mL/hr over 30 Minutes Intravenous Every 24 hours 06/13/21 1113            Subjective: Confused, doesn't say much Sister at bedside  Objective: Vitals:   06/16/21 0739 06/16/21 2056 06/17/21 0500 06/17/21 1317  BP: 134/86 132/90 132/87 (!) 160/101  Pulse: 87 (!) 109 (!) 105   Resp: 16 16 17    Temp:  99.4 F (37.4 C) 98.3 F (36.8 C) 98.1 F (36.7 C)  TempSrc:  Oral Oral Oral  SpO2:  95% 99% 92%  Weight:      Height:        Intake/Output  Summary (Last 24 hours) at 06/17/2021 2017 Last data filed at 06/17/2021 1855 Gross per 24 hour  Intake 450 ml  Output 2800 ml  Net -2350 ml   Filed Weights   06/13/21 1440  Weight: 71.9 kg    Examination:  General: No acute distress. Cardiovascular: RRR Lungs: unlabored Abdomen: Soft, nontender,  nondistended Neurological: moving all extremities, confused  Skin: mild erythema to L flank Extremities: No clubbing or cyanosis.trace edema  Data Reviewed: I have personally reviewed following labs and imaging studies  CBC: Recent Labs  Lab 06/13/21 0022 06/14/21 0908 06/15/21 0452 06/15/21 1021 06/16/21 0443 06/17/21 0425  WBC 10.0 10.9* 7.2  --  8.1 7.8  NEUTROABS 6.9  --   --   --   --   --   HGB 11.6* 9.4* 7.7*  --  8.2* 8.1*  HCT 33.9* 26.6* 22.1* 24.7* 23.3* 23.0*  MCV 81.3 79.4* 78.6*  --  77.9* 76.2*  PLT 251 253 195  --  220 681    Basic Metabolic Panel: Recent Labs  Lab 06/13/21 0022 06/14/21 0908 06/15/21 0452 06/16/21 0443 06/17/21 0425  NA 128* 130* 132* 130*  --   K 4.7 3.3* 3.4* 3.9  --   CL 96* 100 102 101  --   CO2 22 21* 22 21*  --   GLUCOSE 91 130* 118* 103*  --   BUN 14 12 10 12   --   CREATININE 1.18* 0.82 0.74 0.79  --   CALCIUM 9.4 8.4* 8.3* 8.7*  --   MG  --  2.0 2.0 1.8 2.0    GFR: Estimated Creatinine Clearance: 69.1 mL/min (by C-G formula based on SCr of 0.79 mg/dL).  Liver Function Tests: Recent Labs  Lab 06/13/21 0022 06/14/21 0908 06/15/21 0452 06/16/21 0443  AST 299* 227* 170* 156*  ALT 113* 87* 76* 76*  ALKPHOS 357* 243* 195* 242*  BILITOT 6.0* 5.3* 5.1* 5.9*  PROT 7.3 6.0* 5.4* 5.9*  ALBUMIN 3.1* 2.5* 2.2* 2.5*    CBG: No results for input(s): GLUCAP in the last 168 hours.   Recent Results (from the past 240 hour(s))  Resp Panel by RT-PCR (Flu Teresa Hays&B, Covid) Nasopharyngeal Swab     Status: None   Collection Time: 06/13/21  3:50 AM   Specimen: Nasopharyngeal Swab; Nasopharyngeal(NP) swabs in vial transport medium  Result Value Ref Range Status   SARS Coronavirus 2 by RT PCR NEGATIVE NEGATIVE Final    Comment: (NOTE) SARS-CoV-2 target nucleic acids are NOT DETECTED.  The SARS-CoV-2 RNA is generally detectable in upper respiratory specimens during the acute phase of infection. The lowest concentration of SARS-CoV-2  viral copies this assay can detect is 138 copies/mL. Teresa Hays negative result does not preclude SARS-Cov-2 infection and should not be used as the sole basis for treatment or other patient management decisions. Teresa Hays negative result may occur with  improper specimen collection/handling, submission of specimen other than nasopharyngeal swab, presence of viral mutation(s) within the areas targeted by this assay, and inadequate number of viral copies(<138 copies/mL). Pegge Cumberledge negative result must be combined with clinical observations, patient history, and epidemiological information. The expected result is Negative.  Fact Sheet for Patients:  EntrepreneurPulse.com.au  Fact Sheet for Healthcare Providers:  IncredibleEmployment.be  This test is no t yet approved or cleared by the [place] FDA and  has been authorized for detection and/or diagnosis of SARS-CoV-2 by FDA under an Emergency Use Authorization (EUA). This EUA will remain  in effect (meaning this test  can be used) for the duration of the COVID-19 declaration under Section 564(b)(1) of the Act, 21 U.S.C.section 360bbb-3(b)(1), unless the authorization is terminated  or revoked sooner.       Influenza Myquan Schaumb[place] by PCR NEGATIVE NEGATIVE Final   Influenza B by PCR NEGATIVE NEGATIVE Final    Comment: (NOTE) The Xpert Xpress SARS-CoV-2/FLU/RSV plus assay is intended as an aid in the diagnosis of influenza from Nasopharyngeal swab specimens and should not be used as Remmie Bembenek sole basis for treatment. Nasal washings and aspirates are unacceptable for Xpert Xpress SARS-CoV-2/FLU/RSV testing.  Fact Sheet for Patients: EntrepreneurPulse.com.au  Fact Sheet for Healthcare Providers: IncredibleEmployment.be  This test is not yet approved or cleared by the [place] FDA and has been authorized for detection and/or diagnosis of SARS-CoV-2 by FDA under an Emergency Use Authorization  (EUA). This EUA will remain in effect (meaning this test can be used) for the duration of the COVID-19 declaration under Section 564(b)(1) of the Act, 21 U.S.C. section 360bbb-3(b)(1), unless the authorization is terminated or revoked.  Performed at [facility], [place] [address] [place], [place] [address]   Urine Culture     Status: Abnormal   Collection Time: 06/13/21  7:51 AM   Specimen: Urine, Clean Catch  Result Value Ref Range Status   Specimen Description   Final    URINE, CLEAN CATCH Performed at [facility], [place] [address] [place], [place] [address]    Special Requests   Final    NONE Performed at [facility], [place] [address] [name],  [address]    Culture MULTIPLE SPECIES PRESENT, SUGGEST RECOLLECTION (Ehan Freas)  Final   Report Status 06/14/2021 FINAL  Final         Radiology Studies: No results found.      Scheduled Meds:  apixaban  2.5 mg Oral BID   carvedilol  3.125 mg Oral BID WC   Chlorhexidine Gluconate Cloth  6 each Topical Daily   furosemide  20 mg Oral Daily   lactulose  20 g Oral TID   megestrol  600 mg Oral Daily   mometasone-formoterol  2 puff Inhalation BID   potassium chloride SA  20 mEq Oral Daily   umeclidinium bromide  1 puff Inhalation Daily   Continuous Infusions:  cefTRIAXone (ROCEPHIN)  IV 1 g (06/17/21 1228)   dextrose 5 % and 0.45% NaCl 75 mL/hr at 06/17/21 1434   thiamine injection 500 mg (06/17/21 1019)     LOS: 4 days    Time spent: over 30 min    Fayrene Helper, MD [facility]   To contact the attending provider between 7A-7P or the covering provider during after hours 7P-7A, please log into the web site [email or web address] and access using universal [place] password for that web site. If you do not have the password, please call the hospital operator.  06/17/2021, 8:17 PM

## 2021-06-17 NOTE — Progress Notes (Signed)
Daily Progress Note   Patient Name: Teresa Hays       Date: 06/17/2021 DOB: [birth year]  Age: 64 y.o. MRN#: [id number] Attending Physician: Elodia Florence., * Primary Care Physician: [name], DO Admit Date: 06/12/2021  Reason for Consultation/Follow-up: Establishing goals of care  Subjective: I met today with [name] and her daughter, [name].  Patient was awake and alert.  She does not have insight into her condition, but she was able to smile and answer a couple simple questions with one word.  She was confused (said her daughter was her sister when asked but did not know name of daughter or sister) and very weak appearing.  I met with her daughter in the hall and we discussed recommendation for hospice with consideration for residential hospice at [facility].  She was in agreement with referral to [facility] for evaluation for residential hospice  Length of Stay: 4  Current Medications: Scheduled Meds:   apixaban  2.5 mg Oral BID   carvedilol  3.125 mg Oral BID WC   Chlorhexidine Gluconate Cloth  6 each Topical Daily   furosemide  20 mg Oral Daily   lactulose  20 g Oral TID   megestrol  600 mg Oral Daily   mometasone-formoterol  2 puff Inhalation BID   potassium chloride SA  20 mEq Oral Daily   umeclidinium bromide  1 puff Inhalation Daily    Continuous Infusions:  cefTRIAXone (ROCEPHIN)  IV 1 g (06/17/21 1228)   thiamine injection 500 mg (06/17/21 1019)    PRN Meds: guaiFENesin, haloperidol lactate, hydrALAZINE, ipratropium-albuterol, metoprolol tartrate, senna-docusate, sodium chloride flush  Physical Exam        General: Alert, awake, in no acute distress.  Confused. HEENT: No bruits, no goiter, no JVD Heart: Regular rate and  rhythm. No murmur appreciated. Lungs: Good air movement, clear Abdomen: Soft, nontender, nondistended, positive bowel sounds.   Ext: No significant edema Skin: Warm and dry  Vital Signs: BP (!) 142/95 (BP Location: Left Arm)   Pulse 87   Temp 99.2 F (37.3 C) (Oral)   Resp (!) 22   Ht $R'5\' 4"'kv$  (1.626 m)   Wt 71.9 kg   SpO2 100%   BMI 27.21 kg/m  SpO2: SpO2: 100 % O2 Device: O2 Device: Room Air O2 Flow  Rate:    Intake/output summary:  Intake/Output Summary (Last 24 hours) at 06/17/2021 2217 Last data filed at 06/17/2021 3151 Gross per 24 hour  Intake 450 ml  Output 2800 ml  Net -2350 ml   LBM: Last BM Date: 06/17/21 Baseline Weight: Weight: 71.9 kg Most recent weight: Weight: 71.9 kg       Palliative Assessment/Data:      Patient Active Problem List   Diagnosis Date Noted   Acute lower UTI 06/17/2021   Erythema 06/17/2021   LFTs abnormal 06/17/2021   Acute deep vein thrombosis (DVT) of left upper extremity ([place]) 06/17/2021   Essential hypertension 76/16/0737   Diastolic dysfunction 10/62/6948   Electrolyte abnormality 06/17/2021   Anemia 06/17/2021   Acute encephalopathy 06/13/2021   Encephalopathy acute 06/13/2021   Hospital discharge follow-up 06/08/2021   Dehydration    Encephalopathy    AKI (acute kidney injury) ([place]) 05/25/2021   Mild intermittent asthma without complication 54/62/7035   Asthma 04/02/2020   Cough, persistent 11/24/2019   Shortness of breath 11/23/2019   Gastroesophageal reflux disease 10/16/2019   Health maintenance examination 10/16/2019   Dysphagia 10/12/2019   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  Code  Status:    Code Status Orders  (From admission, onward)           Start     Ordered   06/13/21 0637  Do not attempt resuscitation (DNR)  Continuous       Question Answer Comment  In the event of cardiac or respiratory ARREST Do not call a "code blue"   In the event of cardiac or respiratory ARREST Do not perform Intubation, CPR, defibrillation or ACLS   In the event of cardiac or respiratory ARREST Use medication by any route, position, wound care, and other measures to relive pain and suffering. May use oxygen, suction and manual treatment of airway obstruction as needed for comfort.      06/13/21 0093           Code Status History     Date Active Date Inactive Code Status Order ID Comments User Context   05/27/2021 2108 05/31/2021 1832 DNR [id number]  [name], MD Inpatient   05/25/2021 1842 05/27/2021 2108 Full Code [id number]  Little Ishikawa, MD ED      Advance Directive Documentation    Flowsheet Row Most Recent Value  Type of Advance Directive Out of facility DNR (pink MOST or yellow form)  Pre-existing out of facility DNR order (yellow form or pink MOST form) --  "MOST" Form in Place? --      Care plan was discussed with Daughter  Thank you for allowing the Palliative Medicine Team to assist in the care of this patient.   Time In: 1730 Time Out: 1800 Total Time 30 Prolonged Time Billed No      Greater than 50%  of this time was spent counseling and coordinating care related to the above assessment and plan.  Micheline Rough, MD  Please contact Palliative Medicine Team phone at [phone] for questions and concerns.

## 2021-06-17 NOTE — Assessment & Plan Note (Addendum)
Trauma vs cellulitis? To L flank, s/p abx, mild

## 2021-06-17 NOTE — Assessment & Plan Note (Addendum)
Lasix, coreg d/cd Per hospice

## 2021-06-17 NOTE — Assessment & Plan Note (Signed)
In setting of hepatic metastatic lesions Bili 5.9, AST 156, ALT 76 INR 11/7 1.6

## 2021-06-17 NOTE — Care Management Important Message (Signed)
Important Message  Patient Details IM Letter placed in Patients room. Name: Teresa Hays MRN: [id number] Date of Birth: 07-01-1956   Medicare Important Message Given:  Yes     Kerin Salen 06/17/2021, 2:10 PM

## 2021-06-17 NOTE — Progress Notes (Signed)
HEMATOLOGY-ONCOLOGY PROGRESS NOTE  SUBJECTIVE: Teresa Hays appears to be more comfortable today, she smiled to me when I called her name, but was not able to answer any questions.  She has not eaten much.  She is not restless today.  PHYSICAL EXAMINATION: ECOG PERFORMANCE STATUS: 2 - Symptomatic, <50% confined to bed  Vitals:   06/17/21 0500 06/17/21 1317  BP: 132/87 (!) 160/101  Pulse: (!) 105   Resp: 17   Temp: 98.3 F (36.8 C) 98.1 F (36.7 C)  SpO2: 99% 92%   Filed Weights   06/13/21 1440  Weight: 158 lb 8.2 oz (71.9 kg)    Intake/Output from previous day: 11/29 0701 - 11/30 0700 In: -  Out: 1150 [Urine:1150]  GENERAL: Awake confused  SKIN: skin color, texture, turgor are normal, no rashes or significant lesions EYES: normal, Conjunctiva are pink and non-injected, sclera icterus  NEURO: disoriented, but moves all extremities spontaneously  LABORATORY DATA:  I have reviewed the data as listed CMP Latest Ref Rng & Units 06/16/2021 06/15/2021 06/14/2021  Glucose 70 - 99 mg/dL 103(H) 118(H) 130(H)  BUN 8 - 23 mg/dL _0 Creatinine 0.44 - 1.00 mg/dL 0.79 0.74 0.82  Sodium 135 - 145 mmol/L 130(L) 132(L) 130(L)  Potassium 3.5 - 5.1 mmol/L 3.9 3.4(L) 3.3(L)  Chloride 98 - 111 mmol/L 101 102 100  CO2 22 - 32 mmol/L 21(L) 22 21(L)  Calcium 8.9 - 10.3 mg/dL 8.7(L) 8.3(L) 8.4(L)  Total Protein 6.5 - 8.1 g/dL 5.9(L) 5.4(L) 6.0(L)  Total Bilirubin 0.3 - 1.2 mg/dL 5.9(H) 5.1(H) 5.3(H)  Alkaline Phos 38 - 126 U/L 242(H) 195(H) 243(H)  AST 15 - 41 U/L 156(H) 170(H) 227(H)  ALT 0 - 44 U/L 76(H) 76(H) 87(H)    Lab Results  Component Value Date   WBC 7.8 06/17/2021   HGB 8.1 (L) 06/17/2021   HCT 23.0 (L) 06/17/2021   MCV 76.2 (L) 06/17/2021   PLT 256 06/17/2021   NEUTROABS 6.9 06/13/2021    CT Head Wo Contrast  Result Date: 06/13/2021 CLINICAL DATA:  Mental status change, unknown cause EXAM: CT HEAD WITHOUT CONTRAST TECHNIQUE: Contiguous axial images were obtained from  the base of the skull through the vertex without intravenous contrast. COMPARISON:  05/25/2021 FINDINGS: Brain: No evidence of acute infarction, hemorrhage, cerebral edema, mass, mass effect, or midline shift. No hydrocephalus or extra-axial fluid collection. Periventricular white matter changes, likely the sequela of chronic small vessel ischemic disease. Vascular: No hyperdense vessel. Skull: Normal. Negative for fracture or focal lesion. Sinuses/Orbits: No acute finding. Other: The mastoid air cells are well aerated. IMPRESSION: No acute intracranial process. Electronically Signed   By: Merilyn Baba M.D.   On: 06/13/2021 03:21   CT Head Wo Contrast  Result Date: 05/25/2021 CLINICAL DATA:  Altered mental status. Undergoing chemotherapy for stage IV breast cancer. EXAM: CT HEAD WITHOUT CONTRAST TECHNIQUE: Contiguous axial images were obtained from the base of the skull through the vertex without intravenous contrast. COMPARISON:  Brain MR dated 01/06/2021.  Head CT dated 10/25/2005. FINDINGS: Brain: Normal appearing cerebral hemispheres and posterior fossa structures. Normal size and position of the ventricles. No intracranial hemorrhage, mass lesion or CT evidence of acute infarction. Vascular: No hyperdense vessel or unexpected calcification. Skull: Normal. Negative for fracture or focal lesion. Sinuses/Orbits: No acute finding. Other: None. IMPRESSION: No acute abnormality. Electronically Signed   By: Claudie Revering M.D.   On: 05/25/2021 16:05   MR BRAIN W WO CONTRAST  Result Date: 05/26/2021 CLINICAL  DATA:  Mental status change, persistent or worsening. Additional history provided: Metastatic breast cancer, recent mental status change, evaluate for possible stroke versus metastases. EXAM: MRI HEAD WITHOUT AND WITH CONTRAST TECHNIQUE: Multiplanar, multiecho pulse sequences of the brain and surrounding structures were obtained without and with intravenous contrast. CONTRAST:  79mL GADAVIST GADOBUTROL 1  MMOL/ML IV SOLN COMPARISON:  Prior head CT examinations 05/25/2021 and earlier. Brain MRI 01/06/2021. FINDINGS: Brain: Mild intermittent motion degradation. Cerebral volume is within normal limits for age. Mild multifocal T2 FLAIR hyperintense signal abnormality within the cerebral white matter, nonspecific but compatible with chronic small vessel ischemic disease. Redemonstrated tiny chronic lacunar infarct within the right cerebellar hemisphere (series 8, image 7). There is no acute infarct. No evidence of an intracranial mass. No chronic intracranial blood products. No extra-axial fluid collection. No midline shift. Incidentally noted cavum septum pellucidum and cavum vergae. No pathologic intracranial enhancement is identified to suggest intracranial metastatic disease. Vascular: Maintained flow voids within the proximal large arterial vessels. Skull and upper cervical spine: No focal suspicious marrow lesion. Incompletely assessed cervical spondylosis. Sinuses/Orbits: Visualized orbits show no acute finding. Minimal mucosal thickening within the left ethmoid air cells. IMPRESSION: Mildly motion degraded exam. No evidence of acute intracranial abnormality. No evidence of intracranial metastatic disease. Mild chronic small vessel ischemic changes within the cerebral white matter, stable. Redemonstrated tiny chronic lacunar infarct within the right cerebellar hemisphere. Mild left ethmoid sinus mucosal thickening. Electronically Signed   By: Kellie Simmering D.O.   On: 05/26/2021 15:34   DG Chest Port 1 View  Result Date: 06/13/2021 CLINICAL DATA:  Increased weakness and body pain. EXAM: PORTABLE CHEST 1 VIEW COMPARISON:  May 25, 2021 FINDINGS: There is stable left-sided venous Port-A-Cath positioning. The heart size and mediastinal contours are within normal limits. Low lung volumes are seen. Both lungs are clear. The visualized skeletal structures are unremarkable. IMPRESSION: No active disease.  Electronically Signed   By: Virgina Norfolk M.D.   On: 06/13/2021 00:43   DG Chest Port 1 View  Result Date: 05/25/2021 CLINICAL DATA:  Weakness. Undergoing chemotherapy for stage IV breast cancer. EXAM: PORTABLE CHEST 1 VIEW COMPARISON:  01/29/2020. Chest, abdomen and pelvis CT dated 03/24/2021. FINDINGS: Normal sized heart. Clear lungs. Left jugular porta catheter tip in the inferior aspect of the superior vena cava near the superior cavoatrial junction. Mild scoliosis. IMPRESSION: No acute abnormality. Electronically Signed   By: Claudie Revering M.D.   On: 05/25/2021 15:41   EEG adult  Result Date: 06/15/2021 Lora Havens, MD     06/15/2021  5:39 PM Patient Name: Teresa Hays MRN: [id number] Epilepsy Attending: Lora Havens Referring Physician/Provider: Dr Gerlean Ren Date: 06/15/2021 Duration: 23.23 mins Patient history: 65yo F with ams. EEG to evaluate for seizure. Level of alertness:  lethargic AEDs during EEG study: None Technical aspects: This EEG study was done with scalp electrodes positioned according to the 10-20 International system of electrode placement. Electrical activity was acquired at a sampling rate of $Remov'500Hz'SWAYYV$  and reviewed with a high frequency filter of $RemoveB'70Hz'DzxgYUcc$  and a low frequency filter of $RemoveB'1Hz'ujZacewA$ . EEG data were recorded continuously and digitally stored. Description: EEG showed continuous generalized high amplitude 3 to 6 Hz theta-delta slowing, at times with triphasic morphology. Hyperventilation and photic stimulation were not performed.   ABNORMALITY - Continuous slow, generalized IMPRESSION: This study is suggestive of moderate diffuse encephalopathy, nonspecific etiology but could be secondary to toxic-metabolic causes. No seizures or epileptiform discharges were seen throughout the  recording. Priyanka Barbra Sarks    ASSESSMENT AND PLAN: 1.  Metabolic Encephalopathy, recurrent  2.  ER/PR positive, HER2 positive metastatic breast cancer 3.  Transaminitis/hyperbilirubinemia  likely due to worsening liver metastases 4.  History of left upper extremity DVT 7.  Hypertension  Plan -Patient has persistent metabolic encephalopathy, no improvement, she has very little oral intake. -I recommend residential hospice, her expected life expectancy is less than 2 weeks given her repeated liver failure and minimal oral intake. -I spoke with her daughter and one of her sisters, both of them agree with comfort care and residential hospice. She has good family support but lives alone.  -I will f/u as needed. I spoke with Dr. Hilma Favors and Dr. Florene Glen.   [name]  06/17/2021

## 2021-06-17 NOTE — Assessment & Plan Note (Addendum)
eliquis -> d/c'd with plan for inpatient hospice

## 2021-06-17 NOTE — Assessment & Plan Note (Signed)
noted 

## 2021-06-17 NOTE — Assessment & Plan Note (Addendum)
Unclear etiology - polypharmacy vs UTI vs ammonia vs related to underlying malignancy Ammonia 39, TSH wnl, B12 elevated, folate WNL Urine cx with multiple species Head CT without acute abnormality 11/8 MRI brain without acute abnormality EEG with mod diffuse encephalopathy Hold gabapentin, cymbalta, tramadol, ativan Thiamine 500 mg daily Lactulose Appreciate oncology recommendations - leptomeningeal disease on ddx (oncology notes she's not candidate for further cancer treatment if leptomeningeal disease and systemic treatment also unlikely with worsening LFT's - they did not recommend proceding with LP) - recommending hospice (as above) - plan for [facility]

## 2021-06-17 NOTE — Assessment & Plan Note (Addendum)
Plan for inpatient hospice, bed today, will discharge to [facility] Appreciate palliative care and oncology assistance

## 2021-06-17 NOTE — Assessment & Plan Note (Signed)
Dr. Burr Medico recommending hospice in setting of encephalopathy, and liver failure

## 2021-06-17 NOTE — Assessment & Plan Note (Addendum)
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

## 2021-06-17 NOTE — Assessment & Plan Note (Addendum)
cx with multiple species On ceftriaxone, she's had adequate course for this, will d/c

## 2021-06-18 DIAGNOSIS — C787 Secondary malignant neoplasm of liver and intrahepatic bile duct: Principal | ICD-10-CM

## 2021-06-18 DIAGNOSIS — Z7189 Other specified counseling: Secondary | ICD-10-CM

## 2021-06-18 DIAGNOSIS — Z515 Encounter for palliative care: Secondary | ICD-10-CM

## 2021-06-18 DIAGNOSIS — C50919 Malignant neoplasm of unspecified site of unspecified female breast: Secondary | ICD-10-CM

## 2021-06-18 MED ORDER — OXYCODONE HCL 5 MG PO TABS
2.5000 mg | ORAL_TABLET | ORAL | Status: DC | PRN
  Administered 2021-06-18 – 2021-06-19 (×2): 2.5 mg via ORAL
  Filled 2021-06-18 (×3): qty 1

## 2021-06-18 NOTE — Progress Notes (Signed)
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  She's represented with increasing lethargy.  Continues with persistent encephalopathy.  Oncology recommending hospice.   Assessment & Plan:   Principal Problem:   Acute encephalopathy Active Problems:   Asthma   Goals of care, counseling/discussion   Encephalopathy acute   Metastatic breast cancer (HCC)   Acute lower UTI   Erythema   LFTs abnormal   Acute deep vein thrombosis (DVT) of left upper extremity (HCC)   Essential hypertension   Diastolic dysfunction   Electrolyte abnormality   Anemia  * Acute encephalopathy Unclear etiology - polypharmacy vs UTI vs ammonia vs related to underlying malignancy Ammonia 39, TSH wnl, B12 elevated, folate WNL Urine cx with multiple species Head CT without acute abnormality 11/8 MRI brain without acute abnormality EEG with mod diffuse encephalopathy Hold gabapentin, cymbalta, tramadol, ativan Thiamine 500 mg daily Lactulose Appreciate oncology recommendations - leptomeningeal disease on ddx (oncology notes she's not candidate for further cancer treatment if leptomeningeal disease and systemic treatment also unlikely with worsening LFT's - they did not recommend proceding with LP) - recommending hospice (as above)    Goals of care, counseling/discussion Appreciate palliative care assistance.  Planning to follow closely over next day or so with regards to nutrition and hydration.   Metastatic breast cancer ([place]) Dr. Burr Medico recommending hospice in setting of encephalopathy, and liver failure  Acute lower UTI cx with multiple species On  ceftriaxone, she's had adequate course for this, will d/c  Erythema Trauma vs cellulitis? 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 No seizures or epileptiform discharges were seen throughout the recording. Antimicrobials:  Anti-infectives (From admission, onward)    Start     Dose/Rate Route Frequency Ordered Stop   06/13/21 1200  cefTRIAXone (ROCEPHIN) 1 g in sodium chloride 0.9 % 100 mL IVPB  Status:  Discontinued        1 g 200 mL/hr over 30 Minutes Intravenous Every 24 hours 06/13/21 1113 06/18/21 1525          Subjective: Remains confused  Objective: Vitals:   06/17/21 2127 06/18/21 0418 06/18/21 0811 06/18/21 1305  BP: (!) 142/95 134/86  (!) 126/91  Pulse: 87 80  83  Resp: (!) 22   15  Temp: 99.2 F (37.3 C) 98.1 F (36.7 C)  97.8 F (36.6 C)  TempSrc: Oral Axillary  Oral  SpO2: 100% 99% 100% 100%  Weight:      Height:  Intake/Output Summary (Last 24 hours) at 06/18/2021 1527 Last data filed at 06/18/2021 1313 Gross per 24 hour  Intake 470 ml  Output 1650 ml  Net -1180 ml   Filed Weights   06/13/21 1440  Weight: 71.9 kg    Examination:  General: No acute distress. Cardiovascular: RRR Lungs: unlabored Abdomen: Soft,  nontender, nondistended  Neurological: waxing/waning encephalopathy - less alert and attentive when I first saw her, when I saw her later with daughter at bedside, better attention, but still confused, unable to say daughter's name  Data Reviewed: I have personally reviewed following labs and imaging studies  CBC: Recent Labs  Lab 06/13/21 0022 06/14/21 0908 06/15/21 0452 06/15/21 1021 06/16/21 0443 06/17/21 0425  WBC 10.0 10.9* 7.2  --  8.1 7.8  NEUTROABS 6.9  --   --   --   --   --   HGB 11.6* 9.4* 7.7*  --  8.2* 8.1*  HCT 33.9* 26.6* 22.1* 24.7* 23.3* 23.0*  MCV 81.3 79.4* 78.6*  --  77.9* 76.2*  PLT 251 253 195  --  220 035    Basic Metabolic Panel: Recent Labs  Lab 06/13/21 0022 06/14/21 0908 06/15/21 0452 06/16/21 0443 06/17/21 0425  NA 128* 130* 132* 130*  --   K 4.7 3.3* 3.4* 3.9  --   CL 96* 100 102 101  --   CO2 22 21* 22 21*  --   GLUCOSE 91 130* 118* 103*  --   BUN 14 12 10 12   --   CREATININE 1.18* 0.82 0.74 0.79  --   CALCIUM 9.4 8.4* 8.3* 8.7*  --   MG  --  2.0 2.0 1.8 2.0    GFR: Estimated Creatinine Clearance: 69.1 mL/min (by C-G formula based on SCr of 0.79 mg/dL).  Liver Function Tests: Recent Labs  Lab 06/13/21 0022 06/14/21 0908 06/15/21 0452 06/16/21 0443  AST 299* 227* 170* 156*  ALT 113* 87* 76* 76*  ALKPHOS 357* 243* 195* 242*  BILITOT 6.0* 5.3* 5.1* 5.9*  PROT 7.3 6.0* 5.4* 5.9*  ALBUMIN 3.1* 2.5* 2.2* 2.5*    CBG: No results for input(s): GLUCAP in the last 168 hours.   Recent Results (from the past 240 hour(s))  Resp Panel by RT-PCR (Flu Ahmaud Duthie&B, Covid) Nasopharyngeal Swab     Status: None   Collection Time: 06/13/21  3:50 AM   Specimen: Nasopharyngeal Swab; Nasopharyngeal(NP) swabs in vial transport medium  Result Value Ref Range Status   SARS Coronavirus 2 by RT PCR NEGATIVE NEGATIVE Final    Comment: (NOTE) SARS-CoV-2 target nucleic acids are NOT DETECTED.  The SARS-CoV-2 RNA is generally detectable in upper  respiratory specimens during the acute phase of infection. The lowest concentration of SARS-CoV-2 viral copies this assay can detect is 138 copies/mL. Edder Bellanca negative result does not preclude SARS-Cov-2 infection and should not be used as the sole basis for treatment or other patient management decisions. Karimah Winquist negative result may occur with  improper specimen collection/handling, submission of specimen other than nasopharyngeal swab, presence of viral mutation(s) within the areas targeted by this assay, and inadequate number of viral copies(<138 copies/mL). Encarnacion Scioneaux negative result must be combined with clinical observations, patient history, and epidemiological information. The expected result is Negative.  Fact Sheet for Patients:  EntrepreneurPulse.com.au  Fact Sheet for Healthcare Providers:  IncredibleEmployment.be  This test is no t yet approved or cleared by the [place] FDA and  has been authorized for detection and/or diagnosis of SARS-CoV-2 by FDA  under an Emergency Use Authorization (EUA). This EUA will remain  in effect (meaning this test can be used) for the duration of the COVID-19 declaration under Section 564(b)(1) of the Act, 21 U.S.C.section 360bbb-3(b)(1), unless the authorization is terminated  or revoked sooner.       Influenza Kaicee Scarpino by PCR NEGATIVE NEGATIVE Final   Influenza B by PCR NEGATIVE NEGATIVE Final    Comment: (NOTE) The Xpert Xpress SARS-CoV-2/FLU/RSV plus assay is intended as an aid in the diagnosis of influenza from Nasopharyngeal swab specimens and should not be used as Myleka Moncure sole basis for treatment. Nasal washings and aspirates are unacceptable for Xpert Xpress SARS-CoV-2/FLU/RSV testing.  Fact Sheet for Patients: EntrepreneurPulse.com.au  Fact Sheet for Healthcare Providers: IncredibleEmployment.be  This test is not yet approved or cleared by the [place] FDA and has been  authorized for detection and/or diagnosis of SARS-CoV-2 by FDA under an Emergency Use Authorization (EUA). This EUA will remain in effect (meaning this test can be used) for the duration of the COVID-19 declaration under Section 564(b)(1) of the Act, 21 U.S.C. section 360bbb-3(b)(1), unless the authorization is terminated or revoked.  Performed at [facility], [place] [address] [place], [place] [address]   Urine Culture     Status: Abnormal   Collection Time: 06/13/21  7:51 AM   Specimen: Urine, Clean Catch  Result Value Ref Range Status   Specimen Description   Final    URINE, CLEAN CATCH Performed at [facility], [place] [address] [place], [place] [address]    Special Requests   Final    NONE Performed at [facility], [place] [address], [place], [place] [address]    Culture MULTIPLE SPECIES PRESENT, SUGGEST RECOLLECTION (Paislynn Hegstrom)  Final   Report Status 06/14/2021 FINAL  Final         Radiology Studies: No results found.      Scheduled Meds:  albuterol  2.5 mg Nebulization BID   apixaban  2.5 mg Oral BID   carvedilol  3.125 mg Oral BID WC   Chlorhexidine Gluconate Cloth  6 each Topical Daily   furosemide  20 mg Oral Daily   lactulose  20 g Oral TID   megestrol  600 mg Oral Daily   mometasone-formoterol  2 puff Inhalation BID   potassium chloride SA  20 mEq Oral Daily   umeclidinium bromide  1 puff Inhalation Daily   Continuous Infusions:  thiamine injection 500 mg (06/18/21 0846)     LOS: 5 days    Time spent: over 30 min    Fayrene Helper, MD [facility]   To contact the attending provider between 7A-7P or the covering provider during after hours 7P-7A, please log into the web site [email or web address] and access using universal [place] password for that web site. If you do not have the password, please call the hospital operator.  06/18/2021, 3:27 PM

## 2021-06-18 NOTE — TOC Progression Note (Signed)
Transition of Care [facility]) - Progression Note    Patient Details  Name: Teresa Hays MRN: [id number] Date of Birth: 1956-02-11  [facility]) CM/SW Contact  Reyes Fifield, Juliann Pulse, RN Phone Number: 06/18/2021, 2:21 PM  Clinical Narrative:  Continue to monitor to asst w/d/c plans-palliative care following;[employer] care rep following for potential residential hospice vs LTC w/hospice.       Barriers to Discharge: Continued Medical Work up  Expected Discharge Plan and Services                                                 Social Determinants of Health (SDOH) Interventions    Readmission Risk Interventions Readmission Risk Prevention Plan 05/29/2021  Transportation Screening Complete  PCP or Specialist Appt within 3-5 Days Complete  HRI or [place] Complete  Social Work Consult for [place] Planning/Counseling Complete  Palliative Care Screening Complete  Medication Review Press photographer) Complete  Some recent data might be hidden

## 2021-06-18 NOTE — Progress Notes (Signed)
Daily Progress Note   Patient Name: Teresa Hays       Date: 06/18/2021 DOB: 02/06/1956  Age: 65 y.o. MRN#: [id number] Attending Physician: Elodia Florence., * Primary Care Physician: [name], DO Admit Date: 06/12/2021  Reason for Consultation/Follow-up: Establishing goals of care  Subjective: Today, Teresa Hays is a little more awake and interactive.  I met today with patient's daughter, Teresa Hays, in conjunction with Dr. Florene Glen.  We discussed her clinical course and options for hospice moving forward.  We discussed [place] and care focused on symptom management and that certain medications would not be continued at [facility], including blood thinner, lactulose, etc.      Her daughter had understood that some medications would be discontinued but was unaware that medications for hepatic encephalopathy would not be part of the care plan.  As her mother has been more interactive today, there is concern with stopping this medication.  We therefore discussed plan to see how she does clinically over the next 24 hours with particular attention to her ability to maintain nutrition and hydration.  If she does wake up and is able to maintain nutrition and hydration, recommendation would be for hospice at long-term care facility.  We also discussed if she is unable to maintain nutrition and hydration on her own, this is a sign her body is shutting down andrecommendation would remain for residential hospice even if this means foregoing lactulose.    Length of Stay: 5  Current Medications: Scheduled Meds:   albuterol  2.5 mg Nebulization BID   apixaban  2.5 mg Oral BID   carvedilol  3.125 mg Oral BID WC   Chlorhexidine Gluconate Cloth  6 each Topical Daily    furosemide  20 mg Oral Daily   lactulose  20 g Oral TID   megestrol  600 mg Oral Daily   mometasone-formoterol  2 puff Inhalation BID   potassium chloride SA  20 mEq Oral Daily   umeclidinium bromide  1 puff Inhalation Daily    Continuous Infusions:  cefTRIAXone (ROCEPHIN)  IV 1 g (06/18/21 1210)   thiamine injection 500 mg (06/18/21 0846)    PRN Meds: albuterol, guaiFENesin, haloperidol lactate, hydrALAZINE, metoprolol tartrate, senna-docusate, sodium chloride flush  Physical Exam        General: Alert, awake, in  no acute distress.  Confused. HEENT: No bruits, no goiter, no JVD Heart: Regular rate and rhythm. No murmur appreciated. Lungs: Good air movement, clear Abdomen: Soft, nontender, nondistended, positive bowel sounds.   Ext: No significant edema Skin: Warm and dry  Vital Signs: BP (!) 126/91 (BP Location: Left Arm)   Pulse 83   Temp 97.8 F (36.6 C) (Oral)   Resp 15   Ht $R'5\' 4"'Ha$  (1.626 m)   Wt 71.9 kg   SpO2 100%   BMI 27.21 kg/m  SpO2: SpO2: 100 % O2 Device: O2 Device: Room Air O2 Flow Rate:    Intake/output summary:  Intake/Output Summary (Last 24 hours) at 06/18/2021 1330 Last data filed at 06/18/2021 1313 Gross per 24 hour  Intake 470 ml  Output 2450 ml  Net -1980 ml    LBM: Last BM Date: 06/17/21 Baseline Weight: Weight: 71.9 kg Most recent weight: Weight: 71.9 kg       Palliative Assessment/Data:      Patient Active Problem List   Diagnosis Date Noted   Acute lower UTI 06/17/2021   Erythema 06/17/2021   LFTs abnormal 06/17/2021   Acute deep vein thrombosis (DVT) of left upper extremity ([place]) 06/17/2021   Essential hypertension 30/03/2329   Diastolic dysfunction 07/62/2633   Electrolyte abnormality 06/17/2021   Anemia 06/17/2021   Acute encephalopathy 06/13/2021   Encephalopathy acute 06/13/2021   Hospital discharge follow-up 06/08/2021   Dehydration    Encephalopathy    AKI (acute kidney injury) ([place]) 05/25/2021   Mild intermittent  asthma without complication 35/45/6256   Asthma 04/02/2020   Cough, persistent 11/24/2019   Shortness of breath 11/23/2019   Gastroesophageal reflux disease 10/16/2019   Health maintenance examination 10/16/2019   Dysphagia 10/12/2019   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  She is a little more awake interactive.  We will therefore plan to closely monitor for the next 24 hours in regard to nutrition and hydration.  We are certainly approaching end-of-life, family is concerned about stopping medications for hepatic encephalopathy if transition were made to [facility] if she is otherwise able to maintain her nutrition hydration.  Code Status:    Code Status Orders  (From admission, onward)           Start     Ordered   06/13/21 0637  Do not attempt resuscitation (DNR)  Continuous       Question Answer Comment  In the event of cardiac or respiratory ARREST Do not call a "code blue"   In the event of cardiac or respiratory ARREST Do not perform Intubation, CPR, defibrillation or ACLS   In the event of cardiac or respiratory ARREST Use medication by any route, position, wound care, and other measures to relive pain and suffering. May use oxygen, suction and manual treatment of airway obstruction as needed for comfort.      06/13/21 3893           Code Status History     Date Active Date Inactive Code Status Order ID Comments User Context   05/27/2021 2108 05/31/2021 1832 DNR [id number]  [name], MD Inpatient   05/25/2021 1842 05/27/2021 2108 Full Code [id number]  Holli Humbles  C, MD ED      Advance Directive Documentation    Flowsheet Row  Most Recent Value  Type of Advance Directive Out of facility DNR (pink MOST or yellow form)  Pre-existing out of facility DNR order (yellow form or pink MOST form) --  "MOST" Form in Place? --      Care plan was discussed with Daughter  Thank you for allowing the Palliative Medicine Team to assist in the care of this patient.   Total Time 30 Prolonged Time Billed No   Greater than 50%  of this time was spent counseling and coordinating care related to the above assessment and plan.  Micheline Rough, MD  Please contact Palliative Medicine Team phone at [phone] for questions and concerns.

## 2021-06-19 MED ORDER — MORPHINE SULFATE (PF) 2 MG/ML IV SOLN: 2.0000 mg | INTRAVENOUS | Status: DC | PRN

## 2021-06-19 MED ORDER — MORPHINE SULFATE (PF) 2 MG/ML IV SOLN
1.0000 mg | INTRAVENOUS | Status: DC | PRN
  Administered 2021-06-19: 1 mg via INTRAVENOUS
  Filled 2021-06-19: qty 1

## 2021-06-19 NOTE — Progress Notes (Signed)
Called report to nurse at [facility]. Spoke to [employer] No questions at this time.

## 2021-06-19 NOTE — TOC Progression Note (Addendum)
Transition of Care [facility]) - Progression Note    Patient Details  Name: Teresa Hays MRN: [id number] Date of Birth: March 13, 1956  Transition of Care [facility]) CM/SW Contact  Tanyika Barros, Juliann Pulse, RN Phone Number: 06/19/2021, 1:59 PM  Clinical Narrative:  Referral for Residential [facility]ice[facility]-spopke to dtr/[name]/sister Berta Minor both in agreement to [place] aware of referral.Await eval, & outcome.DNR signed in shadow chart.  3:39p-accepted for [employer] @ nsg station-if d/c after 5p-nsg can call ptar.    Expected Discharge Plan: [place] Barriers to Discharge: Continued Medical Work up  Expected Discharge Plan and Services Expected Discharge Plan: [place]                                               Social Determinants of Health (SDOH) Interventions    Readmission Risk Interventions Readmission Risk Prevention Plan 05/29/2021  Transportation Screening Complete  PCP or Specialist Appt within 3-5 Days Complete  HRI or [place] Complete  Social Work Consult for [place] Planning/Counseling Complete  Palliative Care Screening Complete  Medication Review Press photographer) Complete  Some recent data might be hidden

## 2021-06-19 NOTE — Discharge Summary (Signed)
Physician Discharge Summary  Teresa Hays DJM:[id number] DOB: Feb 01, 1956 DOA: 06/12/2021  PCP: [name], DO  Admit date: 06/12/2021 Discharge date: 06/19/2021  Time spent: 40 minutes  Recommendations for Outpatient Follow-up:  Comfort measures per hospice  Discharge Diagnoses:  Principal Problem:   Acute encephalopathy Active Problems:   Asthma   Goals of care, counseling/discussion   Encephalopathy acute   Metastatic breast cancer ([place])   Acute lower UTI   Erythema   LFTs abnormal   Acute deep vein thrombosis (DVT) of left upper extremity ([place])   Essential hypertension   Diastolic dysfunction   Electrolyte abnormality   Anemia   Discharge Condition: stable  Diet recommendation: heart healthy  Filed Weights   06/13/21 1440  Weight: 71.9 kg    History of present illness:  65 yo with hx metastatic breast cancer with 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  She's represented with increasing lethargy.  Continues with persistent encephalopathy.  Oncology recommending hospice.  Plan is for [facility] at this time.  Hospital Course:  * Acute encephalopathy Unclear etiology - polypharmacy vs UTI vs ammonia vs related to underlying malignancy Ammonia 39, TSH wnl, B12 elevated, folate WNL Urine cx with multiple species Head CT without acute abnormality 11/8 MRI brain without acute abnormality EEG with mod diffuse encephalopathy Hold gabapentin, cymbalta, tramadol, ativan Thiamine 500 mg daily Lactulose Appreciate oncology recommendations - leptomeningeal disease on ddx (oncology notes she's not candidate for further cancer treatment if leptomeningeal disease and systemic treatment also unlikely with worsening LFT's - they did not recommend proceding with LP) - recommending hospice (as above) - plan for [facility]    Goals of care,  counseling/discussion Plan for inpatient hospice, bed today, will discharge to [facility] Appreciate palliative care and oncology assistance  Metastatic breast cancer [facility]) Dr. Burr Medico recommending hospice in setting of encephalopathy, and liver failure  Acute lower UTI cx with multiple species On ceftriaxone, she's had adequate course for this, will d/c  Erythema Trauma vs cellulitis? 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 No seizures or epileptiform discharges were seen throughout the recording.  Consultations: Oncology palliative  Discharge Exam: Vitals:   06/19/21 0803 06/19/21 1155  BP:  119/77  Pulse:  75  Resp:  16  Temp:  98.6 F (37 C)  SpO2: 98% 100%   Lethargic, doesn't say much or pay close attention, falls asleep quickly Sister at bedside - daughter and niece on phone  General: No acute distress. Cardiovascular: RRR Lungs: unlabored Abdomen: Soft, nontender, nondistended  Neurological: lethargic, poor attention, may answer brief question, but falls to sleep quickly  Extremities: No clubbing or cyanosis. No edema.  Discharge Instructions   Discharge Instructions     Discharge instructions   Complete by: As directed    We're planning for discharge to hospice with comfort measures in the setting of your confusion and metastatic cancer.  Please let  us know if you have any questions or  concerns.   Increase activity slowly   Complete by: As directed       Allergies as of 06/19/2021   No Known Allergies      Medication List     STOP taking these medications    calcium carbonate 1500 (600 Ca) MG Tabs tablet Commonly known as: OSCAL   carvedilol 3.125 MG tablet Commonly known as: COREG   DULoxetine 20 MG capsule Commonly known as: CYMBALTA   Eliquis 2.5 MG Tabs tablet Generic drug: apixaban   famotidine 10 MG tablet Commonly known as: PEPCID   feeding supplement Liqd   gabapentin 300 MG capsule Commonly known as: NEURONTIN   LORazepam 0.5 MG tablet Commonly known as: ATIVAN   meclizine 25 MG tablet Commonly known as: ANTIVERT   megestrol 40 MG/ML suspension Commonly known as: MEGACE   multivitamin with minerals Tabs tablet   ondansetron 8 MG tablet Commonly known as: ZOFRAN   potassium chloride SA 20 MEQ tablet Commonly known as: KLOR-CON M   prochlorperazine 10 MG tablet Commonly known as: COMPAZINE   traMADol 50 MG tablet Commonly known as: ULTRAM       TAKE these medications    Breztri Aerosphere 160-9-4.8 MCG/ACT Aero Generic drug: Budeson-Glycopyrrol-Formoterol INHALE 2 PUFFS BY MOUTH INTO THE LUNGS IN THE MORNING AND AT BEDTIME. What changed:  how much to take how to take this when to take this   budesonide-formoterol 160-4.5 MCG/ACT inhaler Commonly known as: SYMBICORT Inhale 2 puffs into the lungs daily as needed (for shortness of breath/wheezing).   furosemide 20 MG tablet Commonly known as: LASIX Take 1 tablet (20 mg total) by mouth daily.   lactulose 10 GM/15ML solution Commonly known as: CHRONULAC Take 45 mLs (30 g total) by mouth daily.   lidocaine 2 % jelly Commonly known as: XYLOCAINE APPLY OVER PORT Braydon Kullman CATH TOPICALLY 1 HOUR BEFORE PORT BEING ACCESSED AS NEEDED What changed:  how much to take how to take this when to take this reasons to take this   montelukast 10 MG tablet Commonly known as:  SINGULAIR TAKE 1 TABLET (10 MG TOTAL) BY MOUTH AT BEDTIME.       No Known Allergies    The results of significant diagnostics from this hospitalization (including imaging, microbiology, ancillary and laboratory) are listed below for reference.    Significant Diagnostic Studies: CT Head Wo Contrast  Result Date: 06/13/2021 CLINICAL DATA:  Mental status change, unknown cause EXAM: CT HEAD WITHOUT CONTRAST TECHNIQUE: Contiguous axial images were obtained from the base of the skull through the vertex without intravenous contrast. COMPARISON:  05/25/2021 FINDINGS: Brain: No evidence of acute infarction, hemorrhage, cerebral edema, mass, mass effect, or midline shift. No hydrocephalus or extra-axial fluid collection. Periventricular white matter changes, likely the sequela of chronic small vessel ischemic disease. Vascular: No hyperdense vessel. Skull: Normal. Negative for fracture or focal lesion. Sinuses/Orbits: No acute finding. Other: The mastoid air cells are well aerated. IMPRESSION: No acute intracranial process. Electronically Signed   By: Merilyn Baba M.D.   On: 06/13/2021 03:21   CT Head Wo Contrast  Result Date: 05/25/2021 CLINICAL DATA:  Altered mental status. Undergoing chemotherapy for stage IV breast cancer. EXAM: CT HEAD WITHOUT CONTRAST TECHNIQUE: Contiguous axial images were obtained from the base of the skull through the vertex without intravenous contrast. COMPARISON:  Brain MR dated 01/06/2021.  Head CT dated 10/25/2005. FINDINGS: Brain: Normal appearing cerebral hemispheres and posterior fossa structures. Normal  size and position of the ventricles. No intracranial hemorrhage, mass lesion or CT evidence of acute infarction. Vascular: No hyperdense vessel or unexpected calcification. Skull: Normal. Negative for fracture or focal lesion. Sinuses/Orbits: No acute finding. Other: None. IMPRESSION: No acute abnormality. Electronically Signed   By: Claudie Revering M.D.   On: 05/25/2021  16:05   MR BRAIN W WO CONTRAST  Result Date: 05/26/2021 CLINICAL DATA:  Mental status change, persistent or worsening. Additional history provided: Metastatic breast cancer, recent mental status change, evaluate for possible stroke versus metastases. EXAM: MRI HEAD WITHOUT AND WITH CONTRAST TECHNIQUE: Multiplanar, multiecho pulse sequences of the brain and surrounding structures were obtained without and with intravenous contrast. CONTRAST:  40mL GADAVIST GADOBUTROL 1 MMOL/ML IV SOLN COMPARISON:  Prior head CT examinations 05/25/2021 and earlier. Brain MRI 01/06/2021. FINDINGS: Brain: Mild intermittent motion degradation. Cerebral volume is within normal limits for age. Mild multifocal T2 FLAIR hyperintense signal abnormality within the cerebral white matter, nonspecific but compatible with chronic small vessel ischemic disease. Redemonstrated tiny chronic lacunar infarct within the right cerebellar hemisphere (series 8, image 7). There is no acute infarct. No evidence of an intracranial mass. No chronic intracranial blood products. No extra-axial fluid collection. No midline shift. Incidentally noted cavum septum pellucidum and cavum vergae. No pathologic intracranial enhancement is identified to suggest intracranial metastatic disease. Vascular: Maintained flow voids within the proximal large arterial vessels. Skull and upper cervical spine: No focal suspicious marrow lesion. Incompletely assessed cervical spondylosis. Sinuses/Orbits: Visualized orbits show no acute finding. Minimal mucosal thickening within the left ethmoid air cells. IMPRESSION: Mildly motion degraded exam. No evidence of acute intracranial abnormality. No evidence of intracranial metastatic disease. Mild chronic small vessel ischemic changes within the cerebral white matter, stable. Redemonstrated tiny chronic lacunar infarct within the right cerebellar hemisphere. Mild left ethmoid sinus mucosal thickening. Electronically Signed   By: Kellie Simmering D.O.   On: 05/26/2021 15:34   DG Chest Port 1 View  Result Date: 06/13/2021 CLINICAL DATA:  Increased weakness and body pain. EXAM: PORTABLE CHEST 1 VIEW COMPARISON:  May 25, 2021 FINDINGS: There is stable left-sided venous Port-Adalind Weitz-Cath positioning. The heart size and mediastinal contours are within normal limits. Low lung volumes are seen. Both lungs are clear. The visualized skeletal structures are unremarkable. IMPRESSION: No active disease. Electronically Signed   By: Virgina Norfolk M.D.   On: 06/13/2021 00:43   DG Chest Port 1 View  Result Date: 05/25/2021 CLINICAL DATA:  Weakness. Undergoing chemotherapy for stage IV breast cancer. EXAM: PORTABLE CHEST 1 VIEW COMPARISON:  01/29/2020. Chest, abdomen and pelvis CT dated 03/24/2021. FINDINGS: Normal sized heart. Clear lungs. Left jugular porta catheter tip in the inferior aspect of the superior vena cava near the superior cavoatrial junction. Mild scoliosis. IMPRESSION: No acute abnormality. Electronically Signed   By: Claudie Revering M.D.   On: 05/25/2021 15:41   EEG adult  Result Date: 06/15/2021 Lora Havens, MD     06/15/2021  5:39 PM Patient Name: DESIRAY ORCHARD MRN: [id number] Epilepsy Attending: Lora Havens Referring Physician/Provider: Dr Gerlean Ren Date: 06/15/2021 Duration: 23.23 mins Patient history: 65yo F with ams. EEG to evaluate for seizure. Level of alertness:  lethargic AEDs during EEG study: None Technical aspects: This EEG study was done with scalp electrodes positioned according to the 10-20 International system of electrode placement. Electrical activity was acquired at Jaikob Borgwardt sampling rate of 500Hz  and reviewed with Sirus Labrie high frequency filter of 70Hz  and Umaiza Matusik low frequency filter of  1Hz . EEG data were recorded continuously and digitally stored. Description: EEG showed continuous generalized high amplitude 3 to 6 Hz theta-delta slowing, at times with triphasic morphology. Hyperventilation and photic stimulation were  not performed.   ABNORMALITY - Continuous slow, generalized IMPRESSION: This study is suggestive of moderate diffuse encephalopathy, nonspecific etiology but could be secondary to toxic-metabolic causes. No seizures or epileptiform discharges were seen throughout the recording. Lora Havens    Microbiology: Recent Results (from the past 240 hour(s))  Resp Panel by RT-PCR (Flu Whitten Andreoni&B, Covid) Nasopharyngeal Swab     Status: None   Collection Time: 06/13/21  3:50 AM   Specimen: Nasopharyngeal Swab; Nasopharyngeal(NP) swabs in vial transport medium  Result Value Ref Range Status   SARS Coronavirus 2 by RT PCR NEGATIVE NEGATIVE Final    Comment: (NOTE) SARS-CoV-2 target nucleic acids are NOT DETECTED.  The SARS-CoV-2 RNA is generally detectable in upper respiratory specimens during the acute phase of infection. The lowest concentration of SARS-CoV-2 viral copies this assay can detect is 138 copies/mL. Jimy Gates negative result does not preclude SARS-Cov-2 infection and should not be used as the sole basis for treatment or other patient management decisions. Preciliano Castell negative result may occur with  improper specimen collection/handling, submission of specimen other than nasopharyngeal swab, presence of viral mutation(s) within the areas targeted by this assay, and inadequate number of viral copies(<138 copies/mL). Taya Ashbaugh negative result must be combined with clinical observations, patient history, and epidemiological information. The expected result is Negative.  Fact Sheet for Patients:  EntrepreneurPulse.com.au  Fact Sheet for Healthcare Providers:  IncredibleEmployment.be  This test is no t yet approved or cleared by the [place] FDA and  has been authorized for detection and/or diagnosis of SARS-CoV-2 by FDA under an Emergency Use Authorization (EUA). This EUA will remain  in effect (meaning this test can be used) for the duration of the COVID-19 declaration  under Section 564(b)(1) of the Act, 21 U.S.C.section 360bbb-3(b)(1), unless the authorization is terminated  or revoked sooner.       Influenza Thao Bauza by PCR NEGATIVE NEGATIVE Final   Influenza B by PCR NEGATIVE NEGATIVE Final    Comment: (NOTE) The Xpert Xpress SARS-CoV-2/FLU/RSV plus assay is intended as an aid in the diagnosis of influenza from Nasopharyngeal swab specimens and should not be used as Zakiah Beckerman sole basis for treatment. Nasal washings and aspirates are unacceptable for Xpert Xpress SARS-CoV-2/FLU/RSV testing.  Fact Sheet for Patients: EntrepreneurPulse.com.au  Fact Sheet for Healthcare Providers: IncredibleEmployment.be  This test is not yet approved or cleared by the [place] FDA and has been authorized for detection and/or diagnosis of SARS-CoV-2 by FDA under an Emergency Use Authorization (EUA). This EUA will remain in effect (meaning this test can be used) for the duration of the COVID-19 declaration under Section 564(b)(1) of the Act, 21 U.S.C. section 360bbb-3(b)(1), unless the authorization is terminated or revoked.  Performed at [facility], [place] [address], [place], [place] [address]   Urine Culture     Status: Abnormal   Collection Time: 06/13/21  7:51 AM   Specimen: Urine, Clean Catch  Result Value Ref Range Status   Specimen Description   Final    URINE, CLEAN CATCH Performed at [facility], [place] [address] [place], [place] [address]    Special Requests   Final    NONE Performed at [facility], [place] [address], [place], [place] [address]    Culture MULTIPLE SPECIES PRESENT, SUGGEST RECOLLECTION (Avyonna Wagoner)  Final   Report Status 06/14/2021 FINAL  Final     Labs: Basic Metabolic Panel: Recent Labs  Lab 06/13/21 0022 06/14/21 0908 06/15/21 0452 06/16/21 0443 06/17/21 0425  NA 128* 130* 132* 130*  --   K 4.7 3.3* 3.4* 3.9  --   CL 96* 100 102 101  --    CO2 22 21* 22 21*  --   GLUCOSE 91 130* 118* 103*  --   BUN 14 12 10 12   --   CREATININE 1.18* 0.82 0.74 0.79  --   CALCIUM 9.4 8.4* 8.3* 8.7*  --   MG  --  2.0 2.0 1.8 2.0   Liver Function Tests: Recent Labs  Lab 06/13/21 0022 06/14/21 0908 06/15/21 0452 06/16/21 0443  AST 299* 227* 170* 156*  ALT 113* 87* 76* 76*  ALKPHOS 357* 243* 195* 242*  BILITOT 6.0* 5.3* 5.1* 5.9*  PROT 7.3 6.0* 5.4* 5.9*  ALBUMIN 3.1* 2.5* 2.2* 2.5*   No results for input(s): LIPASE, AMYLASE in the last 168 hours. Recent Labs  Lab 06/13/21 0022 06/15/21 1512  AMMONIA 49* 39*   CBC: Recent Labs  Lab 06/13/21 0022 06/14/21 0908 06/15/21 0452 06/15/21 1021 06/16/21 0443 06/17/21 0425  WBC 10.0 10.9* 7.2  --  8.1 7.8  NEUTROABS 6.9  --   --   --   --   --   HGB 11.6* 9.4* 7.7*  --  8.2* 8.1*  HCT 33.9* 26.6* 22.1* 24.7* 23.3* 23.0*  MCV 81.3 79.4* 78.6*  --  77.9* 76.2*  PLT 251 253 195  --  220 256   Cardiac Enzymes: No results for input(s): CKTOTAL, CKMB, CKMBINDEX, TROPONINI in the last 168 hours. BNP: BNP (last 3 results) No results for input(s): BNP in the last 8760 hours.  ProBNP (last 3 results) No results for input(s): PROBNP in the last 8760 hours.  CBG: No results for input(s): GLUCAP in the last 168 hours.     Signed:  Fayrene Helper MD.  [facility] 06/19/2021, 4:02 PM

## 2021-06-19 NOTE — Progress Notes (Signed)
Patient took all pills crushed in applesauce. Tried to administer lactulose and patient refused to swallow fluids.  Mixed lactulose in applesauce and patient still refused to swallow. MD is aware.

## 2021-06-19 NOTE — Progress Notes (Addendum)
Manufacturing engineer [facility]) Hospital Liaison Note  Received request from [employer] [place] for family interest in [facility]. Visited patient at bedside and spoke with daughter/[name]/[phone] to confirm interest and explain services.  Approval for [employer] is determined by [facility] MD. Once [facility] MD has determined [facility] eligibility, [place] will update hospital staff and family.   Addendum  Patient has been approved for [facility] and [facility] has been notified of transport and consents have been signed. [facility] reports that wait time is roughly three years. Family, MD & RN have been notified of above updates.   Please send signed DNR form with patient and RN call report to [phone].     Please do not hesitate to call with any hospice related questions.    Thank you for the opportunity to participate in this patient's care.  Daphene Calamity, MSW [facility] Liaison  [phone]

## 2021-06-19 NOTE — Progress Notes (Signed)
Pharmacist Chemotherapy Monitoring - Initial Assessment    Anticipated start date: 06/26/21   The following has been reviewed per standard work regarding the patient's treatment regimen: The patient's diagnosis, treatment plan and drug doses, and organ/hematologic function Lab orders and baseline tests specific to treatment regimen  The treatment plan start date, drug sequencing, and pre-medications Prior authorization status  Patient's documented medication list, including drug-drug interaction screen and prescriptions for anti-emetics and supportive care specific to the treatment regimen The drug concentrations, fluid compatibility, administration routes, and timing of the medications to be used The patient's access for treatment and lifetime cumulative dose history, if applicable  The patient's medication allergies and previous infusion related reactions, if applicable   Changes made to treatment plan:  N/A  Follow up needed:  N/A   Larene Beach, RPH, 06/19/2021  11:43 AM

## 2021-06-19 NOTE — Progress Notes (Signed)
Daily Progress Note   Patient Name: Teresa Hays       Date: 06/20/2021 DOB: 08/01/1955  Age: 65 y.o. MRN#: [id number] Attending Physician: No att. providers found Primary Care Physician: [name], DO Admit Date: 06/12/2021  Reason for Consultation/Follow-up: Establishing goals of care  Subjective: Today, Ms. Perazzo is less interactive.  I met today with patient's daughter, sister and we discussed via phone with her daughter and niece.  Unfortunately, her status has continued to decline and she is not taking in sufficient nutrition and hydration.  She is also refusing medications such as her lactulose.  In light of this continued decline, discussed with family recommendation to transition to residential hospice for end of life care. Discussed changes in medication regimen to focus on comfort.  Physical Exam        General: Alert, awake, in no acute distress.  Confused. HEENT: No bruits, no goiter, no JVD Heart: Regular rate and rhythm. No murmur appreciated. Lungs: Good air movement, clear Abdomen: Soft, nontender, nondistended, positive bowel sounds.   Ext: No significant edema Skin: Warm and dry  Vital Signs: BP 102/72 (BP Location: Left Arm)   Pulse 77   Temp 98 F (36.7 C) (Oral)   Resp 16   Ht $R'5\' 4"'gR$  (1.626 m)   Wt 71.9 kg   SpO2 99%   BMI 27.21 kg/m  SpO2: SpO2: 99 % O2 Device: O2 Device: Room Air O2 Flow Rate:    Intake/output summary: No intake or output data in the 24 hours ending 06/20/21 0905  LBM: Last BM Date: 06/17/21 Baseline Weight: Weight: 71.9 kg Most recent weight: Weight: 71.9 kg       Palliative Assessment/Data:      Patient Active Problem List   Diagnosis Date Noted   Acute lower UTI 06/17/2021   Erythema 06/17/2021    LFTs abnormal 06/17/2021   Acute deep vein thrombosis (DVT) of left upper extremity ([place]) 06/17/2021   Essential hypertension 47/42/5956   Diastolic dysfunction 38/75/6433   Electrolyte abnormality 06/17/2021   Anemia 06/17/2021   Acute encephalopathy 06/13/2021   Encephalopathy acute 06/13/2021   Hospital discharge follow-up 06/08/2021   Dehydration    Encephalopathy    AKI (acute kidney injury) ([place]) 05/25/2021   Mild intermittent asthma without complication 29/51/8841   Asthma 04/02/2020  Cough, persistent 11/24/2019   Shortness of breath 11/23/2019   Gastroesophageal reflux disease 10/16/2019   Health maintenance examination 10/16/2019   Dysphagia 10/12/2019   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  Referral placed to TOC.  Code Status:    Code Status Orders  (From admission, onward)           Start     Ordered   06/13/21 0637  Do not attempt resuscitation (DNR)  Continuous       Question Answer Comment  In the event of cardiac or respiratory ARREST Do not call a "code blue"   In the event of cardiac or respiratory ARREST Do not perform Intubation, CPR, defibrillation or ACLS   In the event of cardiac or respiratory ARREST Use medication by any route, position, wound care, and other measures to relive pain and suffering. May use oxygen, suction and manual treatment of airway obstruction as needed for comfort.      06/13/21 9935           Code Status History     Date Active Date Inactive Code Status Order ID Comments User Context   05/27/2021 2108 05/31/2021 1832 DNR [id number]  [name], MD Inpatient    05/25/2021 1842 05/27/2021 2108 Full Code [id number]  Little Ishikawa, MD ED      Advance Directive Documentation    Flowsheet Row Most Recent Value  Type of Advance Directive Out of facility DNR (pink MOST or yellow form)  Pre-existing out of facility DNR order (yellow form or pink MOST form) --  "MOST" Form in Place? --      Care plan was discussed with Daughter  Thank you for allowing the Palliative Medicine Team to assist in the care of this patient.   Total Time 30 Prolonged Time Billed No  Greater than 50%  of this time was spent counseling and coordinating care related to the above assessment and plan.   Micheline Rough, MD  Please contact Palliative Medicine Team phone at [phone] for questions and concerns.

## 2021-06-19 NOTE — Progress Notes (Signed)
Oncology brief note  I stopped by today to visit Aly. She was laying in bed, no family at bedside.  She was drowsy, arousable, and does answer some questions with very simple words but remains to be confused. She appears to be comfortable.  Chart reviewed, she has been accepted by [facility], waiting for bed. I totally agree with the plan. I appreciate the inpt team's excellent care.   [name] MD  06/19/2021

## 2021-06-23 ENCOUNTER — Other Ambulatory Visit (HOSPITAL_COMMUNITY): Payer: Self-pay

## 2021-06-24 ENCOUNTER — Other Ambulatory Visit (HOSPITAL_COMMUNITY): Payer: Self-pay

## 2021-06-26 ENCOUNTER — Telehealth: Payer: Self-pay | Admitting: Hematology

## 2021-06-26 ENCOUNTER — Inpatient Hospital Stay: Payer: Medicare Other

## 2021-06-26 ENCOUNTER — Inpatient Hospital Stay: Payer: Medicare Other | Admitting: Hematology

## 2021-06-26 NOTE — Telephone Encounter (Signed)
Scheduled per sch msg. Called and left msg  

## 2021-06-30 ENCOUNTER — Other Ambulatory Visit (HOSPITAL_COMMUNITY): Payer: Self-pay

## 2021-07-03 ENCOUNTER — Inpatient Hospital Stay: Payer: Medicare Other

## 2021-07-03 ENCOUNTER — Ambulatory Visit: Payer: Medicare Other

## 2021-07-03 ENCOUNTER — Ambulatory Visit: Payer: Medicare Other | Admitting: Hematology

## 2021-07-06 ENCOUNTER — Other Ambulatory Visit (HOSPITAL_COMMUNITY): Payer: Self-pay

## 2021-07-19 DEATH — deceased

## 2021-07-21 ENCOUNTER — Other Ambulatory Visit (HOSPITAL_COMMUNITY): Payer: Medicare Other

## 2021-07-21 ENCOUNTER — Encounter (HOSPITAL_COMMUNITY): Payer: Medicare Other | Admitting: Internal Medicine
